# Patient Record
Sex: Female | Born: 1945
Health system: Southern US, Community
[De-identification: ages and names within clinical notes are randomized; demographics above are authoritative.]

## PROBLEM LIST (undated history)

## (undated) DIAGNOSIS — I499 Cardiac arrhythmia, unspecified: Secondary | ICD-10-CM

## (undated) DIAGNOSIS — M1990 Unspecified osteoarthritis, unspecified site: Secondary | ICD-10-CM

## (undated) DIAGNOSIS — Z9641 Presence of insulin pump (external) (internal): Secondary | ICD-10-CM

## (undated) DIAGNOSIS — E785 Hyperlipidemia, unspecified: Secondary | ICD-10-CM

## (undated) DIAGNOSIS — D219 Benign neoplasm of connective and other soft tissue, unspecified: Secondary | ICD-10-CM

## (undated) DIAGNOSIS — I2510 Atherosclerotic heart disease of native coronary artery without angina pectoris: Secondary | ICD-10-CM

## (undated) DIAGNOSIS — I4892 Unspecified atrial flutter: Secondary | ICD-10-CM

## (undated) DIAGNOSIS — E109 Type 1 diabetes mellitus without complications: Secondary | ICD-10-CM

## (undated) DIAGNOSIS — M8580 Other specified disorders of bone density and structure, unspecified site: Secondary | ICD-10-CM

## (undated) DIAGNOSIS — I10 Essential (primary) hypertension: Secondary | ICD-10-CM

## (undated) DIAGNOSIS — I38 Endocarditis, valve unspecified: Secondary | ICD-10-CM

## (undated) HISTORY — PX: PELVIC LAPAROSCOPY: SHX162

## (undated) HISTORY — DX: Unspecified osteoarthritis, unspecified site: M19.90

## (undated) HISTORY — PX: COLONOSCOPY: SHX174

## (undated) HISTORY — DX: Atherosclerotic heart disease of native coronary artery without angina pectoris: I25.10

## (undated) HISTORY — PX: TUBAL LIGATION: SHX77

## (undated) HISTORY — DX: Essential (primary) hypertension: I10

## (undated) HISTORY — DX: Benign neoplasm of connective and other soft tissue, unspecified: D21.9

## (undated) HISTORY — PX: APPENDECTOMY: SHX54

## (undated) HISTORY — PX: EYE SURGERY: SHX253

## (undated) HISTORY — DX: Other specified disorders of bone density and structure, unspecified site: M85.80

## (undated) HISTORY — DX: Unspecified atrial flutter: I48.92

## (undated) HISTORY — PX: CORONARY ANGIOPLASTY WITH STENT PLACEMENT: SHX49

## (undated) HISTORY — DX: Hyperlipidemia, unspecified: E78.5

## (undated) HISTORY — DX: Type 1 diabetes mellitus without complications: E10.9

## (undated) HISTORY — PX: CATARACT EXTRACTION: SUR2

---

## 1975-11-05 HISTORY — PX: MYOMECTOMY: SHX85

## 1998-03-21 ENCOUNTER — Ambulatory Visit (HOSPITAL_COMMUNITY): Admission: RE | Admit: 1998-03-21 | Discharge: 1998-03-21 | Payer: Self-pay | Admitting: Otolaryngology

## 1998-12-07 ENCOUNTER — Other Ambulatory Visit: Admission: RE | Admit: 1998-12-07 | Discharge: 1998-12-07 | Payer: Self-pay | Admitting: Obstetrics and Gynecology

## 1999-12-05 ENCOUNTER — Encounter: Payer: Self-pay | Admitting: Orthopedic Surgery

## 1999-12-05 ENCOUNTER — Encounter: Admission: RE | Admit: 1999-12-05 | Discharge: 1999-12-05 | Payer: Self-pay | Admitting: Orthopedic Surgery

## 2000-05-28 ENCOUNTER — Other Ambulatory Visit: Admission: RE | Admit: 2000-05-28 | Discharge: 2000-05-28 | Payer: Self-pay | Admitting: Obstetrics and Gynecology

## 2001-05-28 ENCOUNTER — Other Ambulatory Visit: Admission: RE | Admit: 2001-05-28 | Discharge: 2001-05-28 | Payer: Self-pay | Admitting: Obstetrics and Gynecology

## 2002-05-28 ENCOUNTER — Other Ambulatory Visit: Admission: RE | Admit: 2002-05-28 | Discharge: 2002-05-28 | Payer: Self-pay | Admitting: Obstetrics and Gynecology

## 2002-12-06 ENCOUNTER — Encounter: Payer: Self-pay | Admitting: Cardiovascular Disease

## 2002-12-06 ENCOUNTER — Ambulatory Visit (HOSPITAL_COMMUNITY): Admission: RE | Admit: 2002-12-06 | Discharge: 2002-12-06 | Payer: Self-pay | Admitting: Cardiovascular Disease

## 2004-05-03 ENCOUNTER — Other Ambulatory Visit: Admission: RE | Admit: 2004-05-03 | Discharge: 2004-05-03 | Payer: Self-pay | Admitting: Obstetrics and Gynecology

## 2005-05-10 ENCOUNTER — Other Ambulatory Visit: Admission: RE | Admit: 2005-05-10 | Discharge: 2005-05-10 | Payer: Self-pay | Admitting: Addiction Medicine

## 2006-05-13 ENCOUNTER — Other Ambulatory Visit: Admission: RE | Admit: 2006-05-13 | Discharge: 2006-05-13 | Payer: Self-pay | Admitting: Obstetrics and Gynecology

## 2007-05-27 ENCOUNTER — Other Ambulatory Visit: Admission: RE | Admit: 2007-05-27 | Discharge: 2007-05-27 | Payer: Self-pay | Admitting: Obstetrics and Gynecology

## 2008-05-30 ENCOUNTER — Inpatient Hospital Stay (HOSPITAL_BASED_OUTPATIENT_CLINIC_OR_DEPARTMENT_OTHER): Admission: RE | Admit: 2008-05-30 | Discharge: 2008-05-30 | Payer: Self-pay | Admitting: Cardiovascular Disease

## 2008-06-01 ENCOUNTER — Ambulatory Visit (HOSPITAL_COMMUNITY): Admission: RE | Admit: 2008-06-01 | Discharge: 2008-06-02 | Payer: Self-pay | Admitting: Cardiovascular Disease

## 2008-06-28 ENCOUNTER — Other Ambulatory Visit: Admission: RE | Admit: 2008-06-28 | Discharge: 2008-06-28 | Payer: Self-pay | Admitting: Obstetrics and Gynecology

## 2008-06-30 ENCOUNTER — Encounter (HOSPITAL_COMMUNITY): Admission: RE | Admit: 2008-06-30 | Discharge: 2008-08-01 | Payer: Self-pay | Admitting: Cardiovascular Disease

## 2008-08-02 ENCOUNTER — Encounter (HOSPITAL_COMMUNITY): Admission: RE | Admit: 2008-08-02 | Discharge: 2008-10-31 | Payer: Self-pay | Admitting: Cardiovascular Disease

## 2009-07-13 ENCOUNTER — Other Ambulatory Visit: Admission: RE | Admit: 2009-07-13 | Discharge: 2009-07-13 | Payer: Self-pay | Admitting: Obstetrics and Gynecology

## 2009-07-13 ENCOUNTER — Encounter: Payer: Self-pay | Admitting: Obstetrics and Gynecology

## 2009-07-13 ENCOUNTER — Ambulatory Visit: Payer: Self-pay | Admitting: Obstetrics and Gynecology

## 2009-08-01 ENCOUNTER — Ambulatory Visit: Payer: Self-pay | Admitting: Obstetrics and Gynecology

## 2010-07-24 ENCOUNTER — Other Ambulatory Visit: Admission: RE | Admit: 2010-07-24 | Discharge: 2010-07-24 | Payer: Self-pay | Admitting: Obstetrics and Gynecology

## 2010-07-24 ENCOUNTER — Ambulatory Visit: Payer: Self-pay | Admitting: Obstetrics and Gynecology

## 2010-10-05 ENCOUNTER — Ambulatory Visit: Payer: Self-pay | Admitting: Cardiovascular Disease

## 2010-12-07 ENCOUNTER — Ambulatory Visit (INDEPENDENT_AMBULATORY_CARE_PROVIDER_SITE_OTHER): Payer: Managed Care, Other (non HMO) | Admitting: *Deleted

## 2010-12-07 DIAGNOSIS — I4892 Unspecified atrial flutter: Secondary | ICD-10-CM

## 2010-12-10 ENCOUNTER — Telehealth (INDEPENDENT_AMBULATORY_CARE_PROVIDER_SITE_OTHER): Payer: Self-pay | Admitting: *Deleted

## 2010-12-11 ENCOUNTER — Other Ambulatory Visit (HOSPITAL_COMMUNITY): Payer: Managed Care, Other (non HMO)

## 2010-12-11 ENCOUNTER — Encounter: Payer: Self-pay | Admitting: Cardiology

## 2010-12-11 ENCOUNTER — Ambulatory Visit (HOSPITAL_COMMUNITY): Payer: Managed Care, Other (non HMO) | Attending: Cardiovascular Disease

## 2010-12-11 DIAGNOSIS — R0609 Other forms of dyspnea: Secondary | ICD-10-CM

## 2010-12-11 DIAGNOSIS — I4891 Unspecified atrial fibrillation: Secondary | ICD-10-CM | POA: Insufficient documentation

## 2010-12-11 DIAGNOSIS — R079 Chest pain, unspecified: Secondary | ICD-10-CM | POA: Insufficient documentation

## 2010-12-11 DIAGNOSIS — R0602 Shortness of breath: Secondary | ICD-10-CM

## 2010-12-11 DIAGNOSIS — E109 Type 1 diabetes mellitus without complications: Secondary | ICD-10-CM

## 2010-12-11 DIAGNOSIS — I2510 Atherosclerotic heart disease of native coronary artery without angina pectoris: Secondary | ICD-10-CM

## 2010-12-12 ENCOUNTER — Ambulatory Visit (HOSPITAL_COMMUNITY): Payer: Managed Care, Other (non HMO) | Attending: Cardiovascular Disease

## 2010-12-12 ENCOUNTER — Encounter: Payer: Self-pay | Admitting: Cardiology

## 2010-12-12 DIAGNOSIS — I059 Rheumatic mitral valve disease, unspecified: Secondary | ICD-10-CM | POA: Insufficient documentation

## 2010-12-12 DIAGNOSIS — E119 Type 2 diabetes mellitus without complications: Secondary | ICD-10-CM | POA: Insufficient documentation

## 2010-12-12 DIAGNOSIS — E785 Hyperlipidemia, unspecified: Secondary | ICD-10-CM | POA: Insufficient documentation

## 2010-12-12 DIAGNOSIS — I079 Rheumatic tricuspid valve disease, unspecified: Secondary | ICD-10-CM | POA: Insufficient documentation

## 2010-12-12 DIAGNOSIS — I4891 Unspecified atrial fibrillation: Secondary | ICD-10-CM | POA: Insufficient documentation

## 2010-12-14 ENCOUNTER — Ambulatory Visit: Payer: Managed Care, Other (non HMO) | Admitting: Cardiology

## 2010-12-14 ENCOUNTER — Ambulatory Visit (INDEPENDENT_AMBULATORY_CARE_PROVIDER_SITE_OTHER): Payer: Managed Care, Other (non HMO) | Admitting: Cardiovascular Disease

## 2010-12-14 DIAGNOSIS — I4892 Unspecified atrial flutter: Secondary | ICD-10-CM

## 2010-12-14 DIAGNOSIS — I2510 Atherosclerotic heart disease of native coronary artery without angina pectoris: Secondary | ICD-10-CM

## 2010-12-18 ENCOUNTER — Inpatient Hospital Stay (HOSPITAL_BASED_OUTPATIENT_CLINIC_OR_DEPARTMENT_OTHER)
Admission: RE | Admit: 2010-12-18 | Discharge: 2010-12-18 | Disposition: A | Discharge status: Home / Self Care | Payer: Managed Care, Other (non HMO) | Source: Ambulatory Visit | Attending: Cardiovascular Disease | Admitting: Cardiovascular Disease

## 2010-12-18 DIAGNOSIS — Z9861 Coronary angioplasty status: Secondary | ICD-10-CM | POA: Insufficient documentation

## 2010-12-18 DIAGNOSIS — I2510 Atherosclerotic heart disease of native coronary artery without angina pectoris: Secondary | ICD-10-CM | POA: Insufficient documentation

## 2010-12-18 DIAGNOSIS — R9439 Abnormal result of other cardiovascular function study: Secondary | ICD-10-CM | POA: Insufficient documentation

## 2010-12-18 LAB — POCT I-STAT GLUCOSE
Glucose, Bld: 147 mg/dL — ABNORMAL HIGH (ref 70–99)
Operator id: 194801

## 2010-12-20 NOTE — Assessment & Plan Note (Signed)
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  Patient has insulin pump. BS 87 earlier, rechecked at 1245  57.  Nuclear Med Study 1 or 2 day study:  1 day     Stress Test Type:  Treadmill/Lexiscan Reading MD:  Olga Millers, MD     Referring MD:  P.[name] Resting Radionuclide:  Technetium 73m Tetrofosmin     Resting Radionuclide Dose:  11 mCi  Stress Radionuclide:  Technetium 17m Tetrofosmin     Stress Radionuclide Dose:  33 mCi   Stress Protocol  Max Systolic BP: 131 mm Hg   Stress Test Technologist:  Milana Na, EMT-P     Nuclear Technologist:  Domenic Polite, CNMT  Rest Procedure  Myocardial perfusion imaging was performed at rest 45 minutes following the intravenous administration of Technetium 63m Tetrofosmin.  Stress Procedure  The patient received IV Lexiscan 0.4 mg over 15-seconds with concurrent low level exercise and then Technetium 37m Tetrofosmin was injected at 30-seconds while the patient continued walking one more minute.  There were no significant changes with Lexiscan.  Quantitative spect images were obtained after a 45 minute delay.  QPS Raw Data Images:  Acquisition technically good; normal left ventricular size. Stress Images:  There is decreased uptake in the distal anterior wall and apex. Rest Images:   There is decreased uptake in the distal anterior wall and apex, less prominent compared to the stress images. Subtraction (SDS):  These findings are consistent with ischemia. Transient Ischemic Dilatation:  1.01  (Normal <1.22)  Lung/Heart Ratio:  .33  (Normal <0.45)  Quantitative Gated Spect Images QGS EDV:  90 ml QGS ESV:  26 ml QGS EF:  72 % QGS cine images:  Normal wall motion.   Overall Impression  Exercise Capacity: Lexiscan with no exercise. BP Response: Normal blood pressure response. Clinical Symptoms: No chest pain ECG Impression: No diagnostic ST segment change suggestive of ischemia. Overall Impression: Abnormal lexiscan nuclear study with soft tissue attenuation vs small prior distal anterior infarct; mild ischemia in the apex.  Appended Document: Cardiology Nuclear Testing copy send to Dr. Elease Hashimoto

## 2010-12-20 NOTE — Progress Notes (Signed)
Summary: nuc pre procedure  Phone Note Outgoing Call Call back at Home Phone [phone]   Call placed by: Cathlyn Parsons RN,  December 10, 2010 2:50 PM Call placed to: Patient Reason for Call: Confirm/change Appt Summary of Call: Left message with information on Myoview Information Sheet (see scanned document for details).      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

## 2011-01-03 NOTE — Procedures (Signed)
  NAME:  KELLYN, MCCARY NO.:  [id number]  MEDICAL RECORD NO.:  [id number]           PATIENT TYPE:  LOCATION:                                 FACILITY:  PHYSICIAN:  Vesta Mixer, M.D. DATE OF BIRTH:  [birth year]  DATE OF PROCEDURE:  12/18/2010 DATE OF DISCHARGE:                           CARDIAC CATHETERIZATION   Yolonda Purtle is a 65 year old female with a long history of diabetes mellitus.  She has a history of coronary artery disease with PTCA and stenting of her proximal left anterior descending artery.  She recently developed an episode of atrial flutter with a rapid ventricular response.  This was treated effectively with Cardizem and she converted back to normal sinus rhythm a day or so later.  She was scheduled for a Cardiolite study which revealed apical defect.  Because of this apical abnormality, she was referred for cardiac catheterization.  PROCEDURE:  Left heart catheterization with coronary angiography.  The right femoral artery was easily cannulated using modified Seldinger technique.  HEMODYNAMICS:  The left ventricular pressure is 103/12.  The aortic pressure is 112/45.  ANGIOGRAPHY:  Left main.  The left main is fairly normal.  The left anterior descending artery reveals a stent in the proximal LAD. This stent is widely patent.  The first diagonal artery is normal.  The second diagonal artery has minor luminal irregularities.  The left circumflex artery is a fairly large branch.  There is a very high obtuse marginal artery which has a 50% stenosis at its origin. There is also of 40-50% stenosis at its midpoint.  This vessel is fairly small and is perhaps 1.5 mm in diameter.  The second obtuse marginal artery is a fairly large branch and also has a 50-60% stenosis.  The distal circumflex is free of critical disease.  The right coronary artery is dominant.  There are mild diffuse irregularities.  The left ventriculogram is performed  in the 30 RAO position.  It reveals overall normal left ventricular systolic function.  There is no significant mitral regurgitation.  COMPLICATIONS:  None.  CONCLUSION:  Moderate coronary artery disease involving primarily the obtuse marginal artery #1 and obtuse marginal artery #2.  Her stent is widely patent.  We will continue with medical therapy.     Vesta Mixer, M.D.     PJN/MEDQ  D:  12/18/2010  T:  12/19/2010  Job:  [id number]  cc:   Alfonse Alpers. Dagoberto Ligas, M.D.  Electronically Signed by Kristeen Miss M.D. on 01/03/2011 06:15:03 PM

## 2011-01-16 ENCOUNTER — Ambulatory Visit (INDEPENDENT_AMBULATORY_CARE_PROVIDER_SITE_OTHER): Payer: PRIVATE HEALTH INSURANCE | Admitting: Cardiovascular Disease

## 2011-01-16 DIAGNOSIS — I5032 Chronic diastolic (congestive) heart failure: Secondary | ICD-10-CM

## 2011-01-16 DIAGNOSIS — Z9861 Coronary angioplasty status: Secondary | ICD-10-CM

## 2011-01-25 NOTE — H&P (Signed)
NAME:  [name], [name] NO.:  [id number]  MEDICAL RECORD NO.:  [id number]           PATIENT TYPE:  O  LOCATION:  NINV                         FACILITY:  [facility]  PHYSICIAN:  Vesta Mixer, M.D. DATE OF BIRTH:  [birth year]  DATE OF ADMISSION:  12/12/2010 DATE OF DISCHARGE:                             HISTORY & PHYSICAL   [name] is a middle-aged female with a history of coronary artery disease - status post PTCA and stenting of her proximal left anterior descending artery.  She has insulin-dependent diabetes mellitus and a history of atrial flutter.  She also has a history of hyperlipidemia.  She presented to our office last week with an episode of rapid atrial fibrillation.  She was started on Cardizem and converted back to sinus rhythm the following day.  She has felt fairly well.  She had a stress Myoview study which revealed apical ischemia.  Her left ventricular systolic function was normal.  She had an echocardiogram which also revealed normal left ventricular systolic function.  She has mild mitral regurgitation.  She is now scheduled for heart catheterization based on the results of the stress test.  She denies any recent episodes of chest pain or shortness breath.  She denies any syncope or presyncope.  She denies any PND or orthopnea.  CURRENT MEDICATIONS: 1. Insulin pump as directed by Dr. Dagoberto Ligas. 2. Aspirin 325 mg a day. 3. Calcium once a day. 4. Toprol-XL 150 mg a day. 5. Digoxin 0.25 mg a day. 6. Multivitamin once a day. 7. Vitamin D once a day. 8. Plavix 75 mg a day. 9. Crestor 20 mg a day. 10.Niaspan 2 g at night. 11.Nitroglycerin as needed. 12.Cardizem CD 240 mg a day. 13.Pradaxa 150 mg twice a day.  ALLERGIES:  She is allergic to [facility].  PAST MEDICAL HISTORY: 1. History of coronary artery disease - status post PTCA and stenting     of her proximal LAD.  She was noted to have a mid LAD stenosis at     that time. 2. Diabetes  mellitus. 3. History of atrial flutter. 4. Hyperlipidemia.  SOCIAL HISTORY:  The patient does not smoke.  She drinks alcohol occasionally.  FAMILY HISTORY:  Essentially negative.  REVIEW OF SYSTEMS:  As noted in the HPI.  All other systems are negative.  PHYSICAL EXAMINATION:  GENERAL:  She is a middle-aged female in no acute distress.  She is alert and oriented x3.  Mood and affect are normal. VITAL SIGNS:  Her weight is 191 pounds, her blood pressure is 126/62, her heart rate 62. HEENT AND NECK:  2+ carotids.  She has no bruits, no JVD, no thyromegaly.  Her mucous membranes are moist.  Her neck is supple. LUNGS:  Clear.  Her chest wall is nontender. HEART:  Regular rate, S1 and S2. ABDOMEN:  Good bowel sounds and is nontender. EXTREMITIES:  She has no clubbing, cyanosis or edema.  Her pulses are intact. NEURO:  Nonfocal.  Cranial nerves II-XII are intact.  Motor and sensory function intact.  IMPRESSION AND PLAN: 1. Abnormal Cardiolite study.  The patient has a  history of coronary     artery disease and has a known mid-LAD stenosis.  She had an apical     defect on her Myoview study.  We will schedule her for a heart     catheterization for further evaluation.  I cannot completely     exclude breast attenuation but the images certainly appear to be     ischemic.  We will plan this cath for next Tuesday. 2. Atrial flutter.  The patient is converted to sinus rhythm at least     clinically.  I do not have a clear and cut reason as to why she is     having atrial flutter.  She does have mild mitral regurgitation.     Her left ventricular systolic function is normal.  We will go ahead     and stop the Pradaxa in preparation for a heart catheterization     next week.  We will have her start an aspirin once a day.  We will     follow up with her in a week or so following the cath.     Vesta Mixer, M.D.     PJN/MEDQ  D:  12/14/2010  T:  12/15/2010  Job:  [id number]  cc:    Alfonse Alpers. Dagoberto Ligas, M.D.  Electronically Signed by Kristeen Miss M.D. on 01/03/2011 06:15:01 PM

## 2011-03-19 NOTE — Cardiovascular Report (Signed)
NAME:  [name], [name] NO.:  [id number]   MEDICAL RECORD NO.:  [id number]          PATIENT TYPE:  OIB   LOCATION:  1963                         FACILITY:  [facility]   PHYSICIAN:  Vesta Mixer, M.D. DATE OF BIRTH:  01/29/1946   DATE OF PROCEDURE:  05/30/2008  DATE OF DISCHARGE:                            CARDIAC CATHETERIZATION   [name] is a 65 year old female with a history of insulin-dependent  diabetes mellitus.  She has a longstanding history of atrial flutter,  hyperlipidemia, and diabetes mellitus.   She recently presented with an episode of severe chest pain and tachy  palpitations.  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  She had this sudden intense feeling of chest pressure and a very  fast heart rate.  She tried to get off the elliptical machine  immediately and still felt like she was going to pass out.  Gradually,  the sensation resolved.  She has also had some episodes of severe  shortness of breath with minor exertion.  She was seen in the office and  was found to have anterior T-wave inversions and was scheduled for heart  catheterization.   PROCEDURE:  Left heart catheterization with coronary angiography.   The right femoral artery was easily cannulated using modified Seldinger  technique.   HEMODYNAMICS:  LV pressure was 134/13 with an aortic pressure of 123/59.  There was actually no difficulty in crossing the aortic valve, so I  suspect that some of this gradient is perhaps artifact.   ANGIOGRAPHY:  Left main:  The left main is fairly normal.   The left anterior descending artery has a proximal 90% stenosis.  The  plaque is eccentric and it appears like a plaque rupture.  The vessel  appears to be between 2.8 and 3 mm in diameter.   The mid LAD has a long 30-40% stenosis which does not appear to be flow  obstructive.  The distal LAD is unremarkable.  The first diagonal artery  is a very tiny vessel that is  basically insignificant.  Second diagonal  artery is a small-to-moderate-sized vessel with a 20% stenosis  proximally.   The first septal perforator is a small branch and actually originates  near the middle of this proximal LAD.   The left circumflex artery is a moderate-sized vessel.  There is a 20%  stenosis at its origin.  The circumflex artery then gives off a large  first obtuse marginal artery.  This first obtuse marginal artery has a  long 40% stenosis.  The remaining OM and the circumflex artery are  unremarkable.   The right coronary artery is relatively large and is dominant.  There is  a long 20-30% stenosis in the proximal and mid segments.  The distal RCA  is unremarkable.  The posterior descending artery is a moderate-sized  vessel and is normal.  The posterolateral branch is extremely small and  is otherwise normal.   The left ventriculogram was performed in a 30 RAO position.  It reveals  overall well-preserved left ventricular systolic function.  The ejection  fraction is approximately 60-65%.   COMPLICATIONS:  None.   CONCLUSIONS:  Severe coronary artery disease involving the proximal left  anterior descending artery.  She has an ulcerated plaque with a 90%  stenosis.  She has mild-to-moderate irregularities in the mid LAD and in  the first obtuse marginal artery.  We will plan a PCI on her proximal  left anterior descending artery in several days.  We will start her on  Plavix 75 mg a day.           ______________________________  Vesta Mixer, M.D.     PJN/MEDQ  D:  05/30/2008  T:  05/31/2008  Job:  [id number]   cc:   Alfonse Alpers. Dagoberto Ligas, M.D.

## 2011-03-19 NOTE — Cardiovascular Report (Signed)
NAME:  DELANEY, PERONA NO.:  [id number]   MEDICAL RECORD NO.:  [id number]          PATIENT TYPE:  OIB   LOCATION:  2927                         FACILITY:  [facility]   PHYSICIAN:  Vesta Mixer, M.D. DATE OF BIRTH:  1946/08/09   DATE OF PROCEDURE:  06/01/2008  DATE OF DISCHARGE:                            CARDIAC CATHETERIZATION   [name] is a 65 year old female with a long history of diabetes  mellitus.  She recent developed some symptoms of exercise-induced  angina.  She had a diagnostic heart catheterization, which revealed a  90% proximal left anterior descending artery stenosis.  She was found to  have moderate mid LAD stenosis and a moderate left circumflex stenosis.  We brought her back for PCI of her proximal LAD stenosis.   The procedure was left heart catheterization.  She had PTCA and stenting  of her left anterior descending artery.  The right femoral artery was  easily cannulated using a modified Seldinger technique.   The left main was engaged using a Judkins left 4 guide.  Angiomax was  given.  Her ACT was 344.   A Prowater angioplasty wire was positioned down into the distal left  anterior descending artery.  A 2.5 x 23-mm probe and stent was  positioned across the stenosis.  It was deployed at 12 atmospheres for  20 seconds.  This balloon was removed and post-stent dilatation was  achieved using a 2.75-mm x 20-mm Quantum Maverick.  It was positioned  distally and inflated up to 14 atmospheres for 23 seconds and then  pulled back to the proximal segment and inflated up to 48 atmospheres  for 27 seconds.  This gave [place] a very nice angiographic result.  The 90%  residual stenosis was reduced to 0% residual.  She did not have any  symptoms.  She was stable leaving lab.   COMPLICATIONS:  None.   CONCLUSION:  Successful PTCA and stenting of the proximal LAD.  We will  anticipate discharge tomorrow.  She is to continue with Plavix.     ______________________________  Vesta Mixer, M.D.     PJN/MEDQ  D:  06/01/2008  T:  06/02/2008  Job:  [id number]   cc:   Alfonse Alpers. Dagoberto Ligas, M.D.

## 2011-03-19 NOTE — Discharge Summary (Signed)
NAME:  SELAM, [name] NO.:  [id number]   MEDICAL RECORD NO.:  [id number]          PATIENT TYPE:  OIB   LOCATION:  2927                         FACILITY:  [facility]   PHYSICIAN:  Vesta Mixer, M.D. DATE OF BIRTH:  [birth year]   DATE OF ADMISSION:  06/01/2008  DATE OF DISCHARGE:  06/02/2008                               DISCHARGE SUMMARY   DISCHARGE DIAGNOSES:  1. Coronary artery disease - status post percutaneous transluminal      coronary angioplasty and stenting of her left anterior descending      artery.  2. Hypercholesterolemia.  3. History of diabetes mellitus.  4. History of atrial flutter.   DISCHARGE MEDICATIONS:  1. Aspirin 325 mg a day.  2. Plavix 75 mg a day.  3. Toprol-XL 150 mg a day.  4. Digoxin 0.25 mg a day.  5. Nitroglycerin 0.4 mg sublingually as needed.  6. Crestor 20 mg a day.  7. Insulin pump as directed by Dr. Dagoberto Ligas.  8. Calcium once a day.  9. Multivitamin once a day.   DISPOSITION:  The patient will see Dr. Elease Hashimoto in 1-2 weeks for followup  office visit.   HISTORY:  [name] is a 65 year old female who recently presented with  an episode of exertional angina.  She had a diagnostic heart  catheterization which revealed a 90% proximal LAD stenosis.  She is  admitted for angioplasty.   HOSPITAL COURSE:  Coronary artery disease.  The patient had a heart  catheterization which revealed again a 90% stenosis in her proximal LAD.  She does have a 40%-50% stenosis in the mid LAD.  This mid LAD lesion is  quite long and really is not a great candidate for angioplasty at this  time and also I do not think that angioplasty is indicated.   She underwent successful PTCA and stenting of the proximal LAD lesion.  We placed a 2.5 x 23 mm Promus stent deployed at 12 atmospheres.  Post-  stent dilatation was achieved using a 2.75 x 20-mm Quantum Maverick  inflated up to 14 atmospheres.  This gave [place] a very nice angiographic  result with a 0%  residual stenosis.  She tolerated the procedure quite  well and did not have any pains.   We have decided to increase her cholesterol medicine Crestor 20 mg a day  which should be stronger than her Lipitor 80 mg a day.  She will be  discharged home on the above-noted medications in disposition.  She is  to call us if she has any problems.  All of her other medical problems  are stable.           ______________________________  Vesta Mixer, M.D.     PJN/MEDQ  D:  06/02/2008  T:  06/02/2008  Job:  [id number]   cc:   Alfonse Alpers. Dagoberto Ligas, M.D.

## 2011-03-19 NOTE — H&P (Signed)
NAME:  [name], [name] NO.:  [id number]   MEDICAL RECORD NO.:  [id number]            PATIENT TYPE:   LOCATION:                                 FACILITY:   PHYSICIAN:  Vesta Mixer, M.D. DATE OF BIRTH:  [birth year]   DATE OF ADMISSION:  DATE OF DISCHARGE:                              HISTORY & PHYSICAL   [name] is a middle-aged female with a history of mitral valve  prolapse and atrial flutter as well as insulin-dependent diabetes  mellitus.  She is admitted for heart catheterization after having  episodes of chest pain and rapid heartbeat.   [name] is a middle-aged female who has had diabetes mellitus for many  years.  She has had a stress Cardiolite study in the past, which was  unremarkable.  She has a history of atrial flutter, which has been well  controlled on her current medications including metoprolol and digoxin.  She has done fairly well and her diabetes has been fairly well  controlled.   She worked out at [employer] this past week and then had a sudden onset of  lightheadedness, shortness of breath, and tachy palpitations.  Her heart  was beating very fast, in fact, she says faster than when she initially  had her atrial flutter.   The got off the elliptical machine and felt quite weak and dizzy for a  couple of minutes.  The sensation gradually passed.  She still feels a  little bit uneasy, although she has not had return of these  palpitations.  She presents today for further evaluation.   She denies any syncope or presyncope.   CURRENT MEDICATIONS:  1. Insulin pump daily.  2. Aspirin 81 mg a day.  3. Lipitor 80 mg a day.  4. Toprol-XL 150 mg a day.  5. Digoxin 0.25 mg a day.  6. Multivitamin once a day.  7. Humalog pump once a day.   ALLERGIES:  She is allergic to Septra.   PAST MEDICAL HISTORY:  1. History of atrial flutter.  2. Diabetes mellitus.  3. Hyperlipidemia.   SOCIAL HISTORY:  The patient is a nonsmoker.  She drinks  alcohol  occasionally.   FAMILY HISTORY:  Basically unremarkable.   REVIEW OF SYSTEMS:  Reviewed and is essentially negative.   PHYSICAL EXAMINATION:  GENERAL:  She is a middle-aged female, in no  acute distress.  She is alert and oriented x3 and her mood and affect  are normal.  VITAL SIGNS:  Her weight is 180 and blood pressure is 114/70 with heart  rate of 72.  HEENT:  Reveals 2+ carotids.  She has no bruits, no JVD, or no  thyromegaly.  LUNGS:  Clear to auscultation.  HEART:  Regular rate, S1 and S2.  ABDOMEN:  Good bowel sounds and is nontender.  EXTREMITIES:  She has no clubbing, cyanosis, or edema.  NEUROLOGIC:  Nonfocal.   EKG reveals normal sinus rhythm.  She has T-wave inversions in the  anterolateral leads, which are new.   [name] presents with some rather atypical symptoms.  Her symptoms  could  have been atrial flutter, although, I doubt that she would have  developed atrial flutter with a rapid rate when she is on such a high-  dose of metoprolol.  I worry that this might have been ventricular  tachycardia.  She has a long history of coronary artery disease and she  has T-wave inversions in the anterolateral leads, which are worrisome  for ischemia.  I think, our best course is to proceed with coronary  angiogram to evaluate this further.  I have asked her not to exercise  until we this sorted out.  We have discussed the risks, benefits, and  options of heart catheterization.  She understands and agrees to  proceed.           ______________________________  Vesta Mixer, M.D.     PJN/MEDQ  D:  05/24/2008  T:  05/25/2008  Job:  [id number]   cc:   Alfonse Alpers. Dagoberto Ligas, M.D.

## 2011-04-04 ENCOUNTER — Encounter: Payer: Self-pay | Admitting: Cardiovascular Disease

## 2011-04-05 ENCOUNTER — Encounter: Payer: Self-pay | Admitting: Cardiovascular Disease

## 2011-04-05 ENCOUNTER — Ambulatory Visit: Payer: PRIVATE HEALTH INSURANCE | Admitting: Cardiovascular Disease

## 2011-04-05 ENCOUNTER — Ambulatory Visit (INDEPENDENT_AMBULATORY_CARE_PROVIDER_SITE_OTHER): Payer: PRIVATE HEALTH INSURANCE | Admitting: Cardiovascular Disease

## 2011-04-05 DIAGNOSIS — I2510 Atherosclerotic heart disease of native coronary artery without angina pectoris: Secondary | ICD-10-CM

## 2011-04-05 DIAGNOSIS — E119 Type 2 diabetes mellitus without complications: Secondary | ICD-10-CM

## 2011-04-05 DIAGNOSIS — Z794 Long term (current) use of insulin: Secondary | ICD-10-CM | POA: Insufficient documentation

## 2011-04-05 DIAGNOSIS — I480 Paroxysmal atrial fibrillation: Secondary | ICD-10-CM | POA: Insufficient documentation

## 2011-04-05 DIAGNOSIS — I4892 Unspecified atrial flutter: Secondary | ICD-10-CM | POA: Insufficient documentation

## 2011-04-05 DIAGNOSIS — I4891 Unspecified atrial fibrillation: Secondary | ICD-10-CM

## 2011-04-05 DIAGNOSIS — I25119 Atherosclerotic heart disease of native coronary artery with unspecified angina pectoris: Secondary | ICD-10-CM | POA: Insufficient documentation

## 2011-04-05 MED ORDER — METOPROLOL SUCCINATE ER 100 MG PO TB24: 150.0000 mg | ORAL_TABLET | Freq: Every day | ORAL | Status: DC

## 2011-04-05 MED ORDER — ROSUVASTATIN CALCIUM 20 MG PO TABS: 20.0000 mg | ORAL_TABLET | Freq: Every day | ORAL | Status: DC

## 2011-04-05 MED ORDER — DILTIAZEM HCL ER COATED BEADS 120 MG PO CP24: 120.0000 mg | ORAL_CAPSULE | Freq: Every day | ORAL | Status: DC

## 2011-04-05 MED ORDER — CLOPIDOGREL BISULFATE 75 MG PO TABS: 75.0000 mg | ORAL_TABLET | Freq: Every day | ORAL | Status: DC

## 2011-04-05 MED ORDER — DIGOXIN 250 MCG PO TABS: 250.0000 ug | ORAL_TABLET | Freq: Every day | ORAL | Status: DC

## 2011-04-05 NOTE — Progress Notes (Signed)
[name] Date of Birth  08/21/1946 [facility] / [facility] [address]     [address] [place], [place]  [address] [phone]  Fax  [phone]  History of Present Illness:  [name] is a middle-aged female with a history of coronary artery disease, diabetes mellitus, intermittent atrial fibrillation, and hypercholesterolemia. She's done very well since I last saw her 3 months ago. She still under considerable stress getting ready for her daughter's wedding.  She developed atrial fibrillation several months ago. She was started on Cardizem and atrial fibrillation resolved. She seems to be going fairly well. She's had a cardiac catheterization which revealed patent stent in her left anterior descending artery.  Current Outpatient Prescriptions on File Prior to Visit  Medication Sig Dispense Refill  . aspirin 81 MG tablet Take 81 mg by mouth daily.        . Calcium Carbonate-Vitamin D (CALCIUM + D PO) Take by mouth 2 (two) times daily.        . Cholecalciferol (VITAMIN D PO) Take by mouth daily.        . clopidogrel (PLAVIX) 75 MG tablet Take 75 mg by mouth daily.        . digoxin (LANOXIN) 0.25 MG tablet Take 250 mcg by mouth daily.        . Insulin Aspart (INSULIN PUMP) 100 unit/ml SOLN Inject into the skin.        . metoprolol (TOPROL-XL) 100 MG 24 hr tablet Take 150 mg by mouth daily.        . Multiple Vitamin (MULTIVITAMIN) tablet Take 1 tablet by mouth daily.        Marland Kitchen omega-3 acid ethyl esters (LOVAZA) 1 G capsule Take 2 g by mouth 2 (two) times daily.        . rosuvastatin (CRESTOR) 20 MG tablet Take by mouth daily.        Marland Kitchen DISCONTD: Pioglitazone HCl (ACTOS PO) Take by mouth daily.          Allergies  Allergen Reactions  . Septra (Bactrim)     Past Medical History  Diagnosis Date  . Coronary artery disease   . Diabetes mellitus   . Hyperlipidemia   . Atrial flutter     Past Surgical History  Procedure Date  . Coronary angioplasty  12/18/2010    NORMAL LEFT VENTRICULAR SYSTOLIC FUNCTION  . Coronary angioplasty with stent placement     History  Smoking status  . Never Smoker   Smokeless tobacco  . Not on file    History  Alcohol Use No    No family history on file.  Reviw of Systems:  Reviewed in the HPI.  All other systems are negative.  Physical Exam: BP 128/68  Pulse 70  Ht 5\' 9"  (1.753 m)  Wt 191 lb 12.8 oz (87 kg)  BMI 28.32 kg/m2 The patient is alert and oriented x 3.  The mood and affect are normal.  The skin is warm and dry.  Color is normal.  The HEENT exam reveals that the sclera are nonicteric.  The mucous membranes are moist.  The carotids are 2+ without bruits.  There is no thyromegaly.  There is no JVD.  The lungs are clear.  The chest wall is non tender.  The heart exam reveals a regular rate with a normal S1 and S2.  There are no murmurs, gallops, or rubs.  The PMI is not displaced.   Abdominal exam reveals good  bowel sounds.  There is no guarding or rebound.  There is no hepatosplenomegaly or tenderness.  There are no masses.  Exam of the legs reveal no clubbing, cyanosis, or edema.  The legs are without rashes.  The distal pulses are intact.  Cranial nerves II - XII are intact.  Motor and sensory functions are intact.  The gait is normal.  Assessment / Plan:

## 2011-04-05 NOTE — Assessment & Plan Note (Signed)
[name] is doing very well. She's not had any further episodes of chest pain. I'm glad to see that her recent heart catheterization was unremarkable. We'll continue with the same medications.

## 2011-04-05 NOTE — Assessment & Plan Note (Signed)
Her atrial fibrillation resolved after receiving Cardizem. We'll continue the Cardizem for now.

## 2011-07-19 DIAGNOSIS — M8580 Other specified disorders of bone density and structure, unspecified site: Secondary | ICD-10-CM | POA: Insufficient documentation

## 2011-07-19 DIAGNOSIS — D219 Benign neoplasm of connective and other soft tissue, unspecified: Secondary | ICD-10-CM | POA: Insufficient documentation

## 2011-08-02 LAB — CBC
Hemoglobin: 14.2
MCHC: 34.5
MCV: 91.8
RBC: 4.48
RBC: 4.91
WBC: 7.6

## 2011-08-02 LAB — BASIC METABOLIC PANEL
CO2: 27
Calcium: 9.7
Chloride: 106
Chloride: 111
Creatinine, Ser: 0.75
GFR calc Af Amer: >60
GFR calc Af Amer: >60
GFR calc non Af Amer: >60
Potassium: 3.7
Sodium: 142

## 2011-08-02 LAB — POCT I-STAT GLUCOSE: Operator id: 141321

## 2011-08-06 ENCOUNTER — Encounter: Payer: PRIVATE HEALTH INSURANCE | Admitting: Obstetrics and Gynecology

## 2011-08-07 LAB — GLUCOSE, CAPILLARY
Glucose-Capillary: 69 — ABNORMAL LOW
Glucose-Capillary: 79

## 2011-08-12 ENCOUNTER — Encounter: Payer: Self-pay | Admitting: Obstetrics and Gynecology

## 2011-08-13 ENCOUNTER — Other Ambulatory Visit: Payer: Self-pay | Admitting: Obstetrics and Gynecology

## 2011-08-13 ENCOUNTER — Other Ambulatory Visit: Payer: Self-pay | Admitting: *Deleted

## 2011-08-13 DIAGNOSIS — R922 Inconclusive mammogram: Secondary | ICD-10-CM

## 2011-08-27 ENCOUNTER — Encounter: Payer: Self-pay | Admitting: Obstetrics and Gynecology

## 2011-08-27 ENCOUNTER — Other Ambulatory Visit (HOSPITAL_COMMUNITY)
Admission: RE | Admit: 2011-08-27 | Discharge: 2011-08-27 | Disposition: A | Discharge status: Home / Self Care | Payer: PRIVATE HEALTH INSURANCE | Source: Ambulatory Visit | Attending: Obstetrics and Gynecology | Admitting: Obstetrics and Gynecology

## 2011-08-27 ENCOUNTER — Ambulatory Visit (INDEPENDENT_AMBULATORY_CARE_PROVIDER_SITE_OTHER): Payer: PRIVATE HEALTH INSURANCE | Admitting: Obstetrics and Gynecology

## 2011-08-27 VITALS — BP 150/80 | Ht 69.0 in | Wt 190.0 lb

## 2011-08-27 DIAGNOSIS — M949 Disorder of cartilage, unspecified: Secondary | ICD-10-CM

## 2011-08-27 DIAGNOSIS — Z01419 Encounter for gynecological examination (general) (routine) without abnormal findings: Secondary | ICD-10-CM | POA: Insufficient documentation

## 2011-08-27 DIAGNOSIS — M8580 Other specified disorders of bone density and structure, unspecified site: Secondary | ICD-10-CM

## 2011-08-27 NOTE — Progress Notes (Signed)
Patient came to see me today for her annual GYN exam. She is not having menopausal symptoms. She is having no vaginal bleeding. She is having no pelvic pain. She is up-to-date on mammograms. She is due for followup bone density because of osteopenia. She takes calcium and vitamin D and has had no fractures.  ROS: 12 point review done. Pertinent positives include coronary artery disease, diabetes, and atrophic fibrillation.  Physical examination: [name] present. Blood pressure slighty elevated but normal at PCP and last taken earlier this month. HEENT within normal limits. Neck: Thyroid not large. No masses. Supraclavicular nodes: not enlarged. Breasts: Examined in both sitting midline position. No skin changes and no masses. Abdomen: Soft no guarding rebound or masses or hernia. Pelvic: External: Within normal limits. BUS: Within normal limits. Vaginal:within normal limits. Good estrogen effect. No evidence of cystocele rectocele or enterocele. Cervix: clean. Uterus: Normal size and shape. Adnexa: No masses. Rectovaginal exam: Confirmatory and negative. Extremities: Within normal limits.  Assessment: Normal GYN exam  Plan: Bone density.

## 2011-09-10 ENCOUNTER — Encounter: Payer: PRIVATE HEALTH INSURANCE | Admitting: Obstetrics and Gynecology

## 2011-10-24 ENCOUNTER — Telehealth: Payer: Self-pay | Admitting: Cardiovascular Disease

## 2011-10-24 DIAGNOSIS — I2510 Atherosclerotic heart disease of native coronary artery without angina pectoris: Secondary | ICD-10-CM

## 2011-10-24 MED ORDER — METOPROLOL SUCCINATE ER 100 MG PO TB24: 150.0000 mg | ORAL_TABLET | Freq: Every day | ORAL | Status: DC

## 2011-10-24 NOTE — Telephone Encounter (Signed)
New msg Metoprolol 100 mg called to [phone] mailorder 90 day supply Pt wants enough oills until she gets mail order called to [facility] [address]

## 2011-10-24 NOTE — Telephone Encounter (Signed)
New rx sent to [facility] service and a refill to [facility] done, pt informed.

## 2011-11-28 ENCOUNTER — Ambulatory Visit: Payer: PRIVATE HEALTH INSURANCE | Admitting: Cardiovascular Disease

## 2011-12-05 ENCOUNTER — Encounter: Payer: Self-pay | Admitting: Cardiovascular Disease

## 2011-12-05 ENCOUNTER — Ambulatory Visit (INDEPENDENT_AMBULATORY_CARE_PROVIDER_SITE_OTHER): Payer: PRIVATE HEALTH INSURANCE | Admitting: Cardiovascular Disease

## 2011-12-05 DIAGNOSIS — I4892 Unspecified atrial flutter: Secondary | ICD-10-CM

## 2011-12-05 DIAGNOSIS — E785 Hyperlipidemia, unspecified: Secondary | ICD-10-CM

## 2011-12-05 DIAGNOSIS — I2510 Atherosclerotic heart disease of native coronary artery without angina pectoris: Secondary | ICD-10-CM

## 2011-12-05 DIAGNOSIS — I4891 Unspecified atrial fibrillation: Secondary | ICD-10-CM

## 2011-12-05 LAB — BASIC METABOLIC PANEL
BUN: 13 mg/dL (ref 6–23)
Chloride: 106 mEq/L (ref 96–112)
Creatinine, Ser: 0.7 mg/dL (ref 0.4–1.2)
Glucose, Bld: 71 mg/dL (ref 70–99)

## 2011-12-05 LAB — LIPID PANEL
Cholesterol: 171 mg/dL (ref 0–200)
LDL Cholesterol: 97 mg/dL (ref 0–99)

## 2011-12-05 LAB — HEPATIC FUNCTION PANEL
Alkaline Phosphatase: 96 U/L (ref 39–117)
Bilirubin, Direct: 0.1 mg/dL (ref 0.0–0.3)
Total Bilirubin: 0.4 mg/dL (ref 0.3–1.2)
Total Protein: 7.2 g/dL (ref 6.0–8.3)

## 2011-12-05 NOTE — Progress Notes (Signed)
Suzi Roots Date of Birth  [birth year] [facility]     Central Office  [address]    [address]   [address] [place], [place]  [address]    [place], [place]  [address] [phone]  Fax  [phone]  [phone]  Fax [phone]   History of Present Illness:  Renaye is doing very well. She's not having episodes of chest pain or shortness of breath. She's been able to below of her normal activities without any significant problems.  Current Outpatient Prescriptions on File Prior to Visit  Medication Sig Dispense Refill  . aspirin 81 MG tablet Take 81 mg by mouth daily.        . Calcium Carbonate-Vitamin D (CALCIUM + D PO) Take 600 mg by mouth 2 (two) times daily.       . Cholecalciferol (VITAMIN D PO) Take 1,000 mg by mouth daily.       . clopidogrel (PLAVIX) 75 MG tablet Take 1 tablet (75 mg total) by mouth daily.  90 tablet  3  . digoxin (LANOXIN) 0.25 MG tablet Take 1 tablet (250 mcg total) by mouth daily.  90 tablet  3  . diltiazem (CARDIZEM CD) 120 MG 24 hr capsule Take 1 capsule (120 mg total) by mouth daily.  90 capsule  3  . Insulin Aspart (INSULIN PUMP) 100 unit/ml SOLN Inject into the skin.        . metoprolol (TOPROL-XL) 100 MG 24 hr tablet Take 1.5 tablets (150 mg total) by mouth daily.  45 tablet  1  . Multiple Vitamin (MULTIVITAMIN) tablet Take 1 tablet by mouth daily.        Marland Kitchen omega-3 acid ethyl esters (LOVAZA) 1 G capsule Take 2 g by mouth 2 (two) times daily.        . rosuvastatin (CRESTOR) 20 MG tablet Take 1 tablet (20 mg total) by mouth daily.  90 tablet  3    Allergies  Allergen Reactions  . Septra (Bactrim)   . Sulfa Antibiotics     Past Medical History  Diagnosis Date  . Coronary artery disease   . Hyperlipidemia   . Atrial flutter   . Diabetes mellitus   . Fibroid   . Osteopenia     Past Surgical History  Procedure Date  . Coronary angioplasty 12/18/2010    NORMAL LEFT VENTRICULAR SYSTOLIC FUNCTION  . Coronary  angioplasty with stent placement   . Pelvic laparoscopy   . Myomectomy 1977  . Appendectomy     History  Smoking status  . Never Smoker   Smokeless tobacco  . Not on file    History  Alcohol Use  . 5.0 oz/week  . 10 drink(s) per week    Family History  Problem Relation Age of Onset  . Heart disease Mother   . Diabetes Mother   . Hypertension Mother   . Heart disease Father   . Heart disease Brother     Reviw of Systems:  Reviewed in the HPI.  All other systems are negative.  Physical Exam: Blood pressure 151/73, pulse 61, height 5\' 9"  (1.753 m), weight 190 lb 6.4 oz (86.365 kg). General: Well developed, well nourished, in no acute distress.  Head: Normocephalic, atraumatic, sclera non-icteric, mucus membranes are moist,   Neck: Supple. Negative for carotid bruits. JVD not elevated.  Lungs: Clear bilaterally to auscultation without wheezes, rales, or rhonchi. Breathing is unlabored.  Heart: RRR with S1 S2. No murmurs, rubs, or  gallops appreciated.  Abdomen: Soft, non-tender, non-distended with normoactive bowel sounds. No hepatomegaly. No rebound/guarding. No obvious abdominal masses.  Msk:  Strength and tone appear normal for age.  Extremities: No clubbing or cyanosis. No edema.  Distal pedal pulses are 2+ and equal bilaterally.  Neuro: Alert and oriented X 3. Moves all extremities spontaneously.  Psych:  Responds to questions appropriately with a normal affect.  ECG: Normal sinus rhythm. She has mild nonspecific ST and T-wave changes in the inferior leads.  EKG is not significantly changed from her previous sinus rhythm EKG in 2011. The most recent EKG was in February of 2012 and showed atrial flutter.  Assessment / Plan:

## 2011-12-05 NOTE — Patient Instructions (Signed)
Your physician wants you to follow-up in: 6 months You will receive a reminder letter in the mail two months in advance. If you don't receive a letter, please call our office to schedule the follow-up appointment.  Your physician recommends that you return for a FASTING lipid profile: today and in 6 months   Dr Rene Paci [phone]

## 2011-12-05 NOTE — Assessment & Plan Note (Signed)
[name] has not had any further episodes of chest pain. She is status post PTCA and stenting in the past.  We'll see her again in several months for an office visit and fasting labs.

## 2011-12-05 NOTE — Assessment & Plan Note (Signed)
Rosa has done well. She's not had any further episodes of atrial flutter. We'll continue with her current medications.

## 2011-12-06 ENCOUNTER — Other Ambulatory Visit: Payer: Self-pay | Admitting: *Deleted

## 2011-12-06 MED ORDER — ASPIRIN 325 MG PO TABS: 325.0000 mg | ORAL_TABLET | Freq: Every day | ORAL | Status: DC

## 2011-12-06 MED ORDER — NIACIN ER (ANTIHYPERLIPIDEMIC) 500 MG PO TBCR: 1000.0000 mg | EXTENDED_RELEASE_TABLET | Freq: Every day | ORAL | Status: DC

## 2011-12-06 NOTE — Telephone Encounter (Signed)
Her other dr/pcp started her on niaspan last week. I asked dr Elease Hashimoto and he does not want to start zetia then that was written on last lab draw. Her pcp will be checking labs in 3 months and a copy was requested.

## 2011-12-31 ENCOUNTER — Other Ambulatory Visit: Payer: Self-pay | Admitting: *Deleted

## 2011-12-31 MED ORDER — NITROGLYCERIN 0.4 MG SL SUBL: 0.4000 mg | SUBLINGUAL_TABLET | SUBLINGUAL | Status: DC | PRN

## 2012-03-25 ENCOUNTER — Encounter: Payer: Self-pay | Admitting: Internal Medicine

## 2012-03-25 ENCOUNTER — Ambulatory Visit (INDEPENDENT_AMBULATORY_CARE_PROVIDER_SITE_OTHER): Payer: PRIVATE HEALTH INSURANCE | Admitting: Internal Medicine

## 2012-03-25 VITALS — BP 140/72 | HR 88 | Temp 97.4°F | Ht 69.5 in | Wt 187.8 lb

## 2012-03-25 DIAGNOSIS — M899 Disorder of bone, unspecified: Secondary | ICD-10-CM

## 2012-03-25 DIAGNOSIS — F5104 Psychophysiologic insomnia: Secondary | ICD-10-CM

## 2012-03-25 DIAGNOSIS — G4700 Insomnia, unspecified: Secondary | ICD-10-CM

## 2012-03-25 DIAGNOSIS — I2510 Atherosclerotic heart disease of native coronary artery without angina pectoris: Secondary | ICD-10-CM

## 2012-03-25 DIAGNOSIS — M8580 Other specified disorders of bone density and structure, unspecified site: Secondary | ICD-10-CM

## 2012-03-25 DIAGNOSIS — Z23 Encounter for immunization: Secondary | ICD-10-CM

## 2012-03-25 DIAGNOSIS — M949 Disorder of cartilage, unspecified: Secondary | ICD-10-CM

## 2012-03-25 DIAGNOSIS — E119 Type 2 diabetes mellitus without complications: Secondary | ICD-10-CM

## 2012-03-25 MED ORDER — AMITRIPTYLINE HCL 10 MG PO TABS: 5.0000 mg | ORAL_TABLET | Freq: Every evening | ORAL | Status: DC | PRN

## 2012-03-25 NOTE — Patient Instructions (Signed)
It was good to see you today. We have reviewed your prior records including labs and tests today Tdap given today as Health Maintenance reviewed - all recommended immunizations and age-appropriate screenings are up-to-date.  Try amitriptyline as needed for sleep - Your prescription(s) have been submitted to your pharmacy. Please take as directed and contact our office if you believe you are having problem(s) with the medication(s). Other Medications reviewed, no changes at this time. we will send to your prior provider(s) for "release of records" as discussed today -  Please schedule followup in 6 months to continue review, call sooner if problems.

## 2012-03-25 NOTE — Assessment & Plan Note (Signed)
Follows with endo [name] Will send for ROI but defer mgmt to his care The current medical regimen is effective;  continue present plan and medications.

## 2012-03-25 NOTE — Progress Notes (Signed)
Subjective:    Patient ID: [name], female    DOB: 17-Apr-1946, 66 y.o.   MRN: [id number]  HPI  New pt to me and our division, here to establish care with a PCP - follows regularly with various specialists for her chronic issues - feels well overall today  Reviewed chronic medical issues -  CAD - ischemic event 05/2008 while on treadmill - s/p stent 06/01/08 - follows with cards for same - the patient reports compliance with medication(s) as prescribed. Denies adverse side effects.  DM2 - follow with endo for same - on metformin and humalog - denies symptoms hypoglycemia  Osteopenia - DEXA followed by gyn - last done 2012 - on Ca and Vit D, no bone pain, WB activity  complains of insomnia, chronic, ongoing for years - trouble falling asleep and staying asleep - denies depression or anxiety, no "stress" - admits to "restless dog" in bed with her  Past Medical History  Diagnosis Date  . Coronary artery disease   . Hyperlipidemia   . Atrial flutter   . Diabetes mellitus   . Fibroid   . Osteopenia   . Arthritis    Family History  Problem Relation Age of Onset  . Heart disease Mother   . Diabetes Mother   . Hypertension Mother   . Asthma Mother   . Heart disease Father   . Heart disease Brother    History  Substance Use Topics  . Smoking status: Never Smoker   . Smokeless tobacco: Not on file  . Alcohol Use: 5.0 oz/week    10 drink(s) per week   Review of Systems Constitutional: Negative for fever or weight change.  Respiratory: Negative for cough and shortness of breath.   Cardiovascular: Negative for chest pain or palpitations.  Gastrointestinal: Negative for abdominal pain, no bowel changes.  Musculoskeletal: Negative for gait problem or joint swelling.  Skin: Negative for rash.  Neurological: Negative for dizziness or headache.  No other specific complaints in a complete review of systems (except as listed in HPI above).     Objective:   Physical Exam BP  140/72  Pulse 88  Temp(Src) 97.4 F (36.3 C) (Oral)  Ht 5' 9.5" (1.765 m)  Wt 187 lb 12.8 oz (85.186 kg)  BMI 27.34 kg/m2  SpO2 97% Weight: 187 lb 12.8 oz (85.186 kg)  Constitutional: She is overweight, but appears fit, well-developed and well-nourished. No distress.  HENT: Head: Normocephalic and atraumatic. Ears: B TMs ok, no erythema or effusion; Nose: Nose normal. Mouth/Throat: Oropharynx is clear and moist. No oropharyngeal exudate.  Eyes: wears reading glasses. Conjunctivae and EOM are normal. Pupils are equal, round, and reactive to light. No scleral icterus.  Neck: Normal range of motion. Neck supple. No JVD present. No thyromegaly present.  Cardiovascular: Normal rate, regular rhythm and normal heart sounds.  No murmur heard. No BLE edema. Pulmonary/Chest: Effort normal and breath sounds normal. No respiratory distress. She has no wheezes. Musculoskeletal: Normal range of motion, no joint effusions. No gross deformities Neurological: She is alert and oriented to person, place, and time. No cranial nerve deficit. Coordination normal.  Skin: Skin is warm and dry. No rash noted. No erythema.  Psychiatric: She has a normal mood and affect. Her behavior is normal. Judgment and thought content normal.   Lab Results  Component Value Date   WBC 6.5 06/02/2008   HGB 14.2 06/02/2008   HCT 41.1 06/02/2008   PLT 245 06/02/2008   GLUCOSE 71 12/05/2011  CHOL 171 12/05/2011   TRIG 112.0 12/05/2011   HDL 51.60 12/05/2011   LDLCALC 97 12/05/2011   ALT 31 12/05/2011   AST 25 12/05/2011   NA 142 12/05/2011   K 3.6 12/05/2011   CL 106 12/05/2011   CREATININE 0.7 12/05/2011   BUN 13 12/05/2011   CO2 30 12/05/2011       Assessment & Plan:  See problem list. Medications and labs reviewed today.  Time spent with pt today 45 minutes, greater than 50% time spent counseling patient on insomnia, diabetes, CAD hx and medication review. Also review of prior records and ROI need from endo

## 2012-03-25 NOTE — Assessment & Plan Note (Signed)
No evidence depression/anxiety on exam Reviewed importance of sleep hygiene and habits Pt wishes to avoid "habit forming med and prior OTC meds ineffective or caused side effects  Try low dose elavil - erx done

## 2012-03-25 NOTE — Assessment & Plan Note (Signed)
S/p PTCA/stent 05/2008 No anginal symptoms The current medical regimen is effective;  continue present plan and medications.

## 2012-03-25 NOTE — Assessment & Plan Note (Signed)
DEXA followed by gyn - pt reports last done 2012 On Ca+D, WB exercises -  The current medical regimen is effective;  continue present plan and medications.

## 2012-04-09 ENCOUNTER — Ambulatory Visit (INDEPENDENT_AMBULATORY_CARE_PROVIDER_SITE_OTHER)
Admission: RE | Admit: 2012-04-09 | Discharge: 2012-04-09 | Disposition: A | Discharge status: Home / Self Care | Payer: PRIVATE HEALTH INSURANCE | Source: Ambulatory Visit | Attending: Endocrinology | Admitting: Endocrinology

## 2012-04-09 ENCOUNTER — Telehealth: Payer: Self-pay | Admitting: *Deleted

## 2012-04-09 ENCOUNTER — Ambulatory Visit (INDEPENDENT_AMBULATORY_CARE_PROVIDER_SITE_OTHER): Payer: PRIVATE HEALTH INSURANCE | Admitting: Endocrinology

## 2012-04-09 ENCOUNTER — Encounter: Payer: Self-pay | Admitting: Endocrinology

## 2012-04-09 VITALS — BP 150/72 | HR 78 | Temp 97.9°F | Ht 69.5 in | Wt 186.0 lb

## 2012-04-09 DIAGNOSIS — J069 Acute upper respiratory infection, unspecified: Secondary | ICD-10-CM

## 2012-04-09 DIAGNOSIS — R05 Cough: Secondary | ICD-10-CM

## 2012-04-09 MED ORDER — CEFUROXIME AXETIL 250 MG PO TABS: 250.0000 mg | ORAL_TABLET | Freq: Two times a day (BID) | ORAL | Status: AC

## 2012-04-09 MED ORDER — PROMETHAZINE-CODEINE 6.25-10 MG/5ML PO SYRP: 5.0000 mL | ORAL_SOLUTION | ORAL | Status: DC | PRN

## 2012-04-09 NOTE — Patient Instructions (Addendum)
A chest-x-ray is requested for you today.  You will receive a letter with results.   Here are 2 prescriptions:  Cough syrup and antibiotic.  I hope you feel better soon.  If you don't feel better by next week, please call back.  Please call sooner if you get worse.

## 2012-04-09 NOTE — Progress Notes (Signed)
Subjective:    Patient ID: [name], female    DOB: 1945/12/11, 66 y.o.   MRN: [id number]  HPI Pt states 1 week of moderate dry-quality cough in the chest, and assoc loss of voice.   Past Medical History  Diagnosis Date  . Coronary artery disease   . Hyperlipidemia   . Atrial flutter   . Type 1 diabetes mellitus on insulin therapy   . Fibroid   . Osteopenia   . Arthritis     Past Surgical History  Procedure Date  . Coronary angioplasty 12/18/2010    NORMAL LEFT VENTRICULAR SYSTOLIC FUNCTION  . Coronary angioplasty with stent placement   . Pelvic laparoscopy   . Myomectomy 1977  . Appendectomy     History   Social History  . Marital Status: Married    Spouse Name: N/A    Number of Children: N/A  . Years of Education: N/A   Occupational History  . Not on file.   Social History Main Topics  . Smoking status: Never Smoker   . Smokeless tobacco: Not on file  . Alcohol Use: 5.0 oz/week    10 drink(s) per week  . Drug Use: No  . Sexually Active: Yes    Birth Control/ Protection: Post-menopausal   Other Topics Concern  . Not on file   Social History Narrative  . No narrative on file    Current Outpatient Prescriptions on File Prior to Visit  Medication Sig Dispense Refill  . amitriptyline (ELAVIL) 10 MG tablet Take 0.5-1 tablets (5-10 mg total) by mouth at bedtime as needed for sleep.  30 tablet  0  . aspirin 325 MG tablet Take 1 tablet (325 mg total) by mouth daily. 30 minutes before niaspan      . Calcium Carbonate-Vitamin D (CALCIUM + D PO) Take 600 mg by mouth 2 (two) times daily.       . Cholecalciferol (VITAMIN D PO) Take 1,000 mg by mouth daily.       . clopidogrel (PLAVIX) 75 MG tablet Take 1 tablet (75 mg total) by mouth daily.  90 tablet  3  . digoxin (LANOXIN) 0.25 MG tablet Take 1 tablet (250 mcg total) by mouth daily.  90 tablet  3  . diltiazem (CARDIZEM CD) 120 MG 24 hr capsule Take 1 capsule (120 mg total) by mouth daily.  90 capsule  3  .  Insulin Aspart (INSULIN PUMP) 100 unit/ml SOLN Inject into the skin.        . metFORMIN (GLUCOPHAGE) 500 MG tablet Take 2,000 mg by mouth daily with supper.      . metoprolol (TOPROL-XL) 100 MG 24 hr tablet Take 1.5 tablets (150 mg total) by mouth daily.  45 tablet  1  . niacin (NIASPAN) 1000 MG CR tablet Take 1,000 mg by mouth at bedtime.      . nitroGLYCERIN (NITROSTAT) 0.4 MG SL tablet Place 1 tablet (0.4 mg total) under the tongue every 5 (five) minutes as needed.  25 tablet  11  . omega-3 acid ethyl esters (LOVAZA) 1 G capsule Take 2 g by mouth 2 (two) times daily.        . rosuvastatin (CRESTOR) 20 MG tablet Take 1 tablet (20 mg total) by mouth daily.  90 tablet  3    Allergies  Allergen Reactions  . Septra (Bactrim)   . Sulfa Antibiotics     Family History  Problem Relation Age of Onset  . Heart disease Mother   .  Diabetes Mother   . Hypertension Mother   . Asthma Mother   . Heart disease Father   . Heart disease Brother     BP 150/72  Pulse 78  Temp 97.9 F (36.6 C) (Oral)  Ht 5' 9.5" (1.765 m)  Wt 186 lb (84.369 kg)  BMI 27.07 kg/m2  SpO2 96%    Review of Systems She has bilat otalgia, but she denies chest pain    Objective:   Physical Exam VITAL SIGNS:  See vs page GENERAL: no distress head: no deformity eyes: no periorbital swelling, no proptosis external nose and ears are normal mouth: no lesion seen Both eac's and tm's are normal LUNGS:  Clear to auscultation, except for rales at the left base   Cxr: ? Interstitial edema    Assessment & Plan:  Glenford Peers, new ? Of interstitial edema Otalgia, prob due to congestion

## 2012-04-09 NOTE — Telephone Encounter (Signed)
A user error has taken place: encounter opened in error, closed for administrative reasons.

## 2012-04-15 ENCOUNTER — Ambulatory Visit (INDEPENDENT_AMBULATORY_CARE_PROVIDER_SITE_OTHER): Payer: PRIVATE HEALTH INSURANCE | Admitting: Internal Medicine

## 2012-04-15 ENCOUNTER — Encounter: Payer: Self-pay | Admitting: Internal Medicine

## 2012-04-15 VITALS — BP 130/72 | HR 69 | Temp 97.0°F | Ht 69.5 in | Wt 185.8 lb

## 2012-04-15 DIAGNOSIS — R05 Cough: Secondary | ICD-10-CM

## 2012-04-15 MED ORDER — PROMETHAZINE-CODEINE 6.25-10 MG/5ML PO SYRP: 5.0000 mL | ORAL_SOLUTION | ORAL | Status: AC | PRN

## 2012-04-15 NOTE — Progress Notes (Signed)
  Subjective:    Patient ID: BESSE MIRON, female    DOB: May 09, 1946, 66 y.o.   MRN: [id number]  HPI  Here for follow up - seen last week by SAE for cough - tx ceftin + codeine cough suppression cxr wih vasc congestion and advised to see cards Feels better - less cough, no fever, good energy No shortness of breath or edema - ?need to see cards   Also reviewed chronic medical issues -  CAD - ischemic event 05/2008 while on treadmill - s/p stent 06/01/08 - follows with cards for same - the patient reports compliance with medication(s) as prescribed. Denies adverse side effects.  DM2 - follow with endo for same - on metformin and humalog - denies symptoms hypoglycemia  Osteopenia - DEXA followed by gyn - last done 2012 - on Ca and Vit D, no bone pain, WB activity  complains of insomnia, chronic, ongoing for years - trouble falling asleep and staying asleep - denies depression or anxiety, no "stress" - admits to "restless dog" in bed with her  Past Medical History  Diagnosis Date  . Coronary artery disease   . Hyperlipidemia   . Atrial flutter   . Type 1 diabetes mellitus on insulin therapy   . Fibroid   . Osteopenia   . Arthritis    Review of Systems  Cardiovascular: Negative for chest pain or palpitations.  Neurological: Negative for dizziness or headache.      Objective:   Physical Exam  BP 130/72  Pulse 69  Temp 97 F (36.1 C) (Oral)  Ht 5' 9.5" (1.765 m)  Wt 185 lb 12.8 oz (84.278 kg)  BMI 27.04 kg/m2  SpO2 96% Wt Readings from Last 3 Encounters:  04/15/12 185 lb 12.8 oz (84.278 kg)  04/09/12 186 lb (84.369 kg)  03/25/12 187 lb 12.8 oz (85.186 kg)   Constitutional: She is overweight, but appears fit, well-developed and well-nourished. No distress. Neck: Normal range of motion. Neck supple. No JVD present. No thyromegaly present.  Cardiovascular: Normal rate, regular rhythm and normal heart sounds.  No murmur heard. No BLE edema. Pulmonary/Chest: Effort normal  and breath sounds normal. No respiratory distress. She has no wheezes.   Lab Results  Component Value Date   WBC 6.5 06/02/2008   HGB 14.2 06/02/2008   HCT 41.1 06/02/2008   PLT 245 06/02/2008   GLUCOSE 71 12/05/2011   CHOL 171 12/05/2011   TRIG 112.0 12/05/2011   HDL 51.60 12/05/2011   LDLCALC 97 12/05/2011   ALT 31 12/05/2011   AST 25 12/05/2011   NA 142 12/05/2011   K 3.6 12/05/2011   CL 106 12/05/2011   CREATININE 0.7 12/05/2011   BUN 13 12/05/2011   CO2 30 12/05/2011   Dg Chest 2 View  04/09/2012  *RADIOLOGY REPORT*  Clinical Data: Cough, hypertension.  CHEST - 2 VIEW  Comparison: None.  Findings: The cardiac silhouette mediastinum are within normal limits.  There is mild bihilar vascular fullness.  No infiltrates or effusions are identified.  The osseous structures are unremarkable.  IMPRESSION: Mild pulmonary vascular engorgement without edema.  Original Report Authenticated By: Brandon Melnick, M.D.     Assessment & Plan:  Cough - improved from last week overall, no sputum, energy improved- mild residual symptoms but no residual crackles or evidence on exam for volume overload CXR reviewed Ok for refill on syrup - done

## 2012-04-15 NOTE — Patient Instructions (Signed)
It was good to see you today. We have reviewed your prior records including tests today No need for additional antibiotics - ok to use codeine cough syrup as needed at night - Halls or robitussin daytime as needed

## 2012-05-08 ENCOUNTER — Other Ambulatory Visit: Payer: Self-pay | Admitting: Cardiology

## 2012-05-08 DIAGNOSIS — I2510 Atherosclerotic heart disease of native coronary artery without angina pectoris: Secondary | ICD-10-CM

## 2012-05-08 DIAGNOSIS — I4891 Unspecified atrial fibrillation: Secondary | ICD-10-CM

## 2012-05-08 MED ORDER — DILTIAZEM HCL ER COATED BEADS 120 MG PO CP24: 120.0000 mg | ORAL_CAPSULE | Freq: Every day | ORAL | Status: DC

## 2012-05-08 MED ORDER — CLOPIDOGREL BISULFATE 75 MG PO TABS: 75.0000 mg | ORAL_TABLET | Freq: Every day | ORAL | Status: DC

## 2012-05-08 MED ORDER — DIGOXIN 250 MCG PO TABS: 250.0000 ug | ORAL_TABLET | Freq: Every day | ORAL | Status: DC

## 2012-05-11 ENCOUNTER — Other Ambulatory Visit: Payer: Self-pay | Admitting: Cardiovascular Disease

## 2012-05-11 DIAGNOSIS — I2510 Atherosclerotic heart disease of native coronary artery without angina pectoris: Secondary | ICD-10-CM

## 2012-05-11 MED ORDER — ROSUVASTATIN CALCIUM 20 MG PO TABS: 20.0000 mg | ORAL_TABLET | Freq: Every day | ORAL | Status: DC

## 2012-05-11 NOTE — Telephone Encounter (Signed)
Fax Received. Refill Completed. [name] (R.M.A)   

## 2012-05-11 NOTE — Telephone Encounter (Signed)
[phone] [facility] mail order.

## 2012-05-20 ENCOUNTER — Telehealth: Payer: Self-pay | Admitting: Cardiovascular Disease

## 2012-05-20 DIAGNOSIS — I2510 Atherosclerotic heart disease of native coronary artery without angina pectoris: Secondary | ICD-10-CM

## 2012-05-20 MED ORDER — ROSUVASTATIN CALCIUM 20 MG PO TABS: 20.0000 mg | ORAL_TABLET | Freq: Every day | ORAL | Status: DC

## 2012-05-20 NOTE — Telephone Encounter (Signed)
Fax Received. Refill Completed. [name] (R.M.A)   

## 2012-05-20 NOTE — Telephone Encounter (Signed)
PT OUT OF CRESTOR , NEEDS REFILL CALLED INTO [facility] [phone] 90 DAY SUPPLY, AND THEN NEEDS 2 WEEKS WORTH CALLED INTO  [facility] [place] ONLY HAS TWO PILLS LEFT NEEDS ASAP

## 2012-05-21 ENCOUNTER — Telehealth: Payer: Self-pay | Admitting: Cardiovascular Disease

## 2012-05-21 DIAGNOSIS — I2510 Atherosclerotic heart disease of native coronary artery without angina pectoris: Secondary | ICD-10-CM

## 2012-05-21 MED ORDER — ROSUVASTATIN CALCIUM 20 MG PO TABS: 20.0000 mg | ORAL_TABLET | Freq: Every day | ORAL | Status: DC

## 2012-05-21 NOTE — Telephone Encounter (Signed)
Fax Received. Refill Completed. Gopal Malter Chowoe (R.M.A)   

## 2012-05-21 NOTE — Telephone Encounter (Signed)
New msg Pt wants two wk supply of rosuvastatin 20 mg called to [facility] on [address] to give her enough pills to last until mail order arrives

## 2012-08-14 ENCOUNTER — Encounter: Payer: Self-pay | Admitting: Obstetrics and Gynecology

## 2012-08-31 ENCOUNTER — Encounter: Payer: Self-pay | Admitting: Obstetrics and Gynecology

## 2012-08-31 ENCOUNTER — Ambulatory Visit (INDEPENDENT_AMBULATORY_CARE_PROVIDER_SITE_OTHER): Payer: PRIVATE HEALTH INSURANCE | Admitting: Obstetrics and Gynecology

## 2012-08-31 VITALS — BP 124/76 | Ht 69.5 in | Wt 191.0 lb

## 2012-08-31 DIAGNOSIS — E109 Type 1 diabetes mellitus without complications: Secondary | ICD-10-CM | POA: Insufficient documentation

## 2012-08-31 DIAGNOSIS — Z01419 Encounter for gynecological examination (general) (routine) without abnormal findings: Secondary | ICD-10-CM

## 2012-08-31 NOTE — Progress Notes (Signed)
Patient came to see me today for her annual GYN exam. She is doing well without hot flashes. She is having no vaginal bleeding. She is having no pelvic pain. She is up-to-date on her mammograms. She has never had an abnormal Pap smear. Her diabetes is well-controlled. Her last Pap smear was 2012. On bone density the patient has osteopenia. Her last bone density was 2010 and was improved. Her fracture risk is not elevated. She has not had any fractures.  Physical examination:[name] present. HEENT within normal limits. Neck: Thyroid not large. No masses. Supraclavicular nodes: not enlarged. Breasts: Examined in both sitting and lying  position. No skin changes and no masses. Abdomen: Soft no guarding rebound or masses or hernia. Pelvic: External: Within normal limits. BUS: Within normal limits. Vaginal:within normal limits. Good estrogen effect. No evidence of cystocele rectocele or enterocele. Cervix: clean. Uterus: Normal size and shape. Adnexa: No masses. Rectovaginal exam: Confirmatory and negative. Extremities: Within normal limits.  Assessment: Osteopenia  Plan: Scheduled bone density. Continue yearly mammograms. Lab through endocrinologist. Pap not done.The new Pap smear guidelines were discussed with the patient.

## 2012-08-31 NOTE — Patient Instructions (Signed)
Schedule  bone density. Continue yearly mammograms. 

## 2012-09-01 LAB — URINALYSIS W MICROSCOPIC + REFLEX CULTURE
Bacteria, UA: NONE SEEN
Hgb urine dipstick: NEGATIVE
Ketones, ur: NEGATIVE mg/dL
Nitrite: NEGATIVE
Specific Gravity, Urine: 1.008 (ref 1.005–1.030)
Urobilinogen, UA: 0.2 mg/dL (ref 0.0–1.0)

## 2012-09-02 LAB — URINE CULTURE: Colony Count: 1000

## 2012-09-07 ENCOUNTER — Ambulatory Visit (INDEPENDENT_AMBULATORY_CARE_PROVIDER_SITE_OTHER): Payer: PRIVATE HEALTH INSURANCE | Admitting: Cardiovascular Disease

## 2012-09-07 ENCOUNTER — Encounter: Payer: Self-pay | Admitting: Cardiovascular Disease

## 2012-09-07 VITALS — BP 110/60 | HR 69 | Ht 69.5 in | Wt 187.8 lb

## 2012-09-07 DIAGNOSIS — I4891 Unspecified atrial fibrillation: Secondary | ICD-10-CM

## 2012-09-07 DIAGNOSIS — I2510 Atherosclerotic heart disease of native coronary artery without angina pectoris: Secondary | ICD-10-CM

## 2012-09-07 MED ORDER — CLOPIDOGREL BISULFATE 75 MG PO TABS: 75.0000 mg | ORAL_TABLET | Freq: Every day | ORAL | Status: DC

## 2012-09-07 MED ORDER — DILTIAZEM HCL ER COATED BEADS 120 MG PO CP24: 120.0000 mg | ORAL_CAPSULE | Freq: Every day | ORAL | Status: DC

## 2012-09-07 NOTE — Assessment & Plan Note (Signed)
[name] is doing well.  Continue her current medications.

## 2012-09-07 NOTE — Patient Instructions (Signed)
Your physician wants you to follow-up in: 6 months You will receive a reminder letter in the mail two months in advance. If you don't receive a letter, please call our office to schedule the follow-up appointment.  Your physician has recommended you make the following change in your medication:  Stop digoxin Stop lovaza   Your physician recommends that you return for a FASTING lipid profile: 6 months

## 2012-09-07 NOTE — Progress Notes (Signed)
[name] Date of Birth  Apr 03, 1946 [facility]     [place] [facility]  [address]    [address]   [address] [place], [place]  [address]    [place], [place]  [address] [phone]  Fax  [phone]  [phone]  Fax [phone]   Problems. 1. CAD 2. Atrial Flutter 3. Diabetes Mellitus.   History of Present Illness:  [name] is doing very well. She's not having episodes of chest pain or shortness of breath. She's been able to below of her normal activities without any significant problems. She has not had chest pains .  Her insurance will run out the month before she goes on Medicare.   She has not had any CP and no palpitations.     Current Outpatient Prescriptions on File Prior to Visit  Medication Sig Dispense Refill  . amitriptyline (ELAVIL) 10 MG tablet Take 0.5-1 tablets (5-10 mg total) by mouth at bedtime as needed for sleep.  30 tablet  0  . aspirin 325 MG tablet Take 1 tablet (325 mg total) by mouth daily. 30 minutes before niaspan      . Calcium Carbonate-Vitamin D (CALCIUM + D PO) Take 600 mg by mouth 2 (two) times daily.       . Cholecalciferol (VITAMIN D PO) Take 1,000 mg by mouth daily.       . digoxin (LANOXIN) 0.25 MG tablet Take 1 tablet (250 mcg total) by mouth daily.  90 tablet  1  . Insulin Aspart (INSULIN PUMP) 100 unit/ml SOLN Inject into the skin.        . metFORMIN (GLUCOPHAGE) 500 MG tablet Take 2,000 mg by mouth daily with supper.      . metoprolol (TOPROL-XL) 100 MG 24 hr tablet Take 1.5 tablets (150 mg total) by mouth daily.  45 tablet  1  . niacin (NIASPAN) 1000 MG CR tablet Take 1,000 mg by mouth at bedtime.      . nitroGLYCERIN (NITROSTAT) 0.4 MG SL tablet Place 1 tablet (0.4 mg total) under the tongue every 5 (five) minutes as needed.  25 tablet  11  . omega-3 acid ethyl esters (LOVAZA) 1 G capsule Take 1 g by mouth 3 (three) times daily.       . pioglitazone (ACTOS) 15 MG tablet Take 15 mg by mouth daily.      .  [DISCONTINUED] clopidogrel (PLAVIX) 75 MG tablet Take 1 tablet (75 mg total) by mouth daily.  90 tablet  1  . [DISCONTINUED] diltiazem (CARDIZEM CD) 120 MG 24 hr capsule Take 1 capsule (120 mg total) by mouth daily.  90 capsule  1  . [DISCONTINUED] rosuvastatin (CRESTOR) 20 MG tablet Take 1 tablet (20 mg total) by mouth daily.  30 tablet  0    Allergies  Allergen Reactions  . Septra (Bactrim)   . Sulfa Antibiotics     Past Medical History  Diagnosis Date  . Coronary artery disease   . Hyperlipidemia   . Atrial flutter   . Osteopenia   . Arthritis   . Type 1 diabetes mellitus on insulin therapy   . Fibroid     Past Surgical History  Procedure Date  . Coronary angioplasty 12/18/2010    NORMAL LEFT VENTRICULAR SYSTOLIC FUNCTION  . Coronary angioplasty with stent placement   . Pelvic laparoscopy   . Myomectomy 1977  . Appendectomy     History  Smoking status  . Never Smoker   Smokeless tobacco  .  Not on file    History  Alcohol Use  . 3.5 oz/week  . 7 drink(s) per week    Family History  Problem Relation Age of Onset  . Heart disease Mother   . Diabetes Mother   . Hypertension Mother   . Heart disease Father   . Heart disease Brother     Reviw of Systems:  Reviewed in the HPI.  All other systems are negative.  Physical Exam: Blood pressure 110/60, pulse 69, height 5' 9.5" (1.765 m), weight 187 lb 12.8 oz (85.186 kg), SpO2 97.00%. General: Well developed, well nourished, in no acute distress.  Head: Normocephalic, atraumatic, sclera non-icteric, mucus membranes are moist,   Neck: Supple. Negative for carotid bruits. JVD not elevated.  Lungs: Clear bilaterally to auscultation without wheezes, rales, or rhonchi. Breathing is unlabored.  Heart: RRR with S1 S2. No murmurs, rubs, or gallops appreciated.  Abdomen: Soft, non-tender, non-distended with normoactive bowel sounds. No hepatomegaly. No rebound/guarding. No obvious abdominal masses.  Msk:  Strength  and tone appear normal for age.  Extremities: No clubbing or cyanosis. No edema.  Distal pedal pulses are 2+ and equal bilaterally.  Neuro: Alert and oriented X 3. Moves all extremities spontaneously.  Psych:  Responds to questions appropriately with a normal affect.  ECG: Normal sinus rhythm. She has mild nonspecific ST and T-wave changes in the inferior leads.  EKG is not significantly changed from her previous sinus rhythm EKG in 2011. The most recent EKG was in February of 2012 and showed atrial flutter.  Assessment / Plan:

## 2012-09-18 ENCOUNTER — Encounter: Payer: Self-pay | Admitting: Cardiovascular Disease

## 2012-09-21 ENCOUNTER — Encounter: Payer: Self-pay | Admitting: Cardiovascular Disease

## 2012-09-21 ENCOUNTER — Encounter: Payer: Self-pay | Admitting: Cardiology

## 2012-09-22 ENCOUNTER — Ambulatory Visit (INDEPENDENT_AMBULATORY_CARE_PROVIDER_SITE_OTHER): Payer: PRIVATE HEALTH INSURANCE

## 2012-09-22 DIAGNOSIS — M949 Disorder of cartilage, unspecified: Secondary | ICD-10-CM

## 2012-09-22 DIAGNOSIS — M8580 Other specified disorders of bone density and structure, unspecified site: Secondary | ICD-10-CM

## 2012-09-25 ENCOUNTER — Ambulatory Visit (INDEPENDENT_AMBULATORY_CARE_PROVIDER_SITE_OTHER): Payer: PRIVATE HEALTH INSURANCE | Admitting: Internal Medicine

## 2012-09-25 ENCOUNTER — Encounter: Payer: Self-pay | Admitting: Internal Medicine

## 2012-09-25 VITALS — BP 124/70 | HR 64 | Temp 97.0°F | Ht 69.5 in | Wt 192.1 lb

## 2012-09-25 DIAGNOSIS — G4700 Insomnia, unspecified: Secondary | ICD-10-CM

## 2012-09-25 DIAGNOSIS — M8580 Other specified disorders of bone density and structure, unspecified site: Secondary | ICD-10-CM

## 2012-09-25 DIAGNOSIS — F5104 Psychophysiologic insomnia: Secondary | ICD-10-CM

## 2012-09-25 DIAGNOSIS — Z794 Long term (current) use of insulin: Secondary | ICD-10-CM

## 2012-09-25 DIAGNOSIS — M949 Disorder of cartilage, unspecified: Secondary | ICD-10-CM

## 2012-09-25 DIAGNOSIS — E119 Type 2 diabetes mellitus without complications: Secondary | ICD-10-CM

## 2012-09-25 DIAGNOSIS — M7700 Medial epicondylitis, unspecified elbow: Secondary | ICD-10-CM

## 2012-09-25 NOTE — Patient Instructions (Signed)
It was good to see you today. Medications reviewed, no changes at this time. We have reviewed your prior records including labs and tests today Try using "tennis elbow band" over the counter as discussed for your elbow  Please schedule followup in 6-12 months, call sooner if problems. Medial Epicondylitis (Golfer's Elbow) with Rehab Medial epicondylitis involves inflammation and pain around the inner (medial) portion of the elbow. This pain is caused by inflammation of the tendons in the forearm that flex (bring down) the wrist. Medial epicondylitis is also called golfer's elbow, because it is common among golfers. However, it may occur in any individual who flexes the wrist regularly. If medial epicondylitis is left untreated, it may become a chronic problem. SYMPTOMS    Pain, tenderness, or inflammation over the inner (medial) side of the elbow.   Pain or weakness with gripping activities.   Pain that increases with wrist twisting motions (using a screwdriver, playing golf, bowling).  CAUSES   Medial epicondylitis is caused by inflammation of the tendons that flex the wrist. Causes of injury may include:  Chronic, repetitive stress and strain to the tendons that run from the wrist and forearm to the elbow.   Sudden strain on the forearm, including wrist snap when serving balls with racquet sports, or throwing a baseball.  RISK INCREASES WITH:  Sports or occupations that require repetitive and/or strenuous forearm and wrist movements (pitching a baseball, golfing, carpentry).   Poor wrist and forearm strength and flexibility.   Failure to warm up properly before activity.   Resuming activity before healing, rehabilitation, and conditioning are complete.  PREVENTION    Warm up and stretch properly before activity.   Maintain physical fitness:   Strength, flexibility, and endurance.   Cardiovascular fitness.   Wear and use properly fitted equipment.   Learn and use proper  technique and have a coach correct improper technique.   Wear a tennis elbow (counterforce) brace.  PROGNOSIS   The course of this condition depends on the degree of the injury. If treated properly, acute cases (symptoms lasting less than 4 weeks) are often resolved in 2 to 6 weeks. Chronic (longer lasting cases) often resolve in 3 to 6 months, but may require physical therapy. RELATED COMPLICATIONS    Frequently recurring symptoms, resulting in a chronic problem. Properly treating the problem the first time decreases frequency of recurrence.   Chronic inflammation, scarring, and partial tendon tear, requiring surgery.   Delayed healing or resolution of symptoms.  TREATMENT   Treatment first involves the use of ice and medicine, to reduce pain and inflammation. Strengthening and stretching exercises may reduce discomfort, if performed regularly. These exercises may be performed at home, if the condition is an acute injury. Chronic cases may require a referral to a physical therapist for evaluation and treatment. Your caregiver may advise a corticosteroid injection to help reduce inflammation. Rarely, surgery is needed. MEDICATION  If pain medicine is needed, nonsteroidal anti-inflammatory medicines (aspirin and ibuprofen), or other minor pain relievers (acetaminophen), are often advised.   Do not take pain medicine for 7 days before surgery.   Prescription pain relievers may be given, if your caregiver thinks they are needed. Use only as directed and only as much as you need.   Corticosteroid injections may be recommended. These injections should be reserved only for the most severe cases, because they can only be given a certain number of times.  HEAT AND COLD  Cold treatment (icing) should be applied for 10  to 15 minutes every 2 to 3 hours for inflammation and pain, and immediately after activity that aggravates your symptoms. Use ice packs or an ice massage.   Heat treatment may be used  before performing stretching and strengthening activities prescribed by your caregiver, physical therapist, or athletic trainer. Use a heat pack or a warm water soak.  SEEK MEDICAL CARE IF: Symptoms get worse or do not improve in 2 weeks, despite treatment. EXERCISES   RANGE OF MOTION (ROM) AND STRETCHING EXERCISES - Epicondylitis, Medial (Golfer's Elbow) These exercises may help you when beginning to rehabilitate your injury. Your symptoms may go away with or without further involvement from your physician, physical therapist or athletic trainer. While completing these exercises, remember:    Restoring tissue flexibility helps normal motion to return to the joints. This allows healthier, less painful movement and activity.   An effective stretch should be held for at least 30 seconds.   A stretch should never be painful. You should only feel a gentle lengthening or release in the stretched tissue.  RANGE OF MOTION  Wrist Flexion, Active-Assisted  Extend your right / left elbow with your fingers pointing down.*   Gently pull the back of your hand towards you, until you feel a gentle stretch on the top of your forearm.   Hold this position for __________ seconds.  Repeat __________ times. Complete this exercise __________ times per day.   *If directed by your physician, physical therapist or athletic trainer, complete this stretch with your elbow bent, rather than extended. RANGE OF MOTION  Wrist Extension, Active-Assisted  Extend your right / left elbow and turn your palm upwards.*   Gently pull your palm and fingertips back, so your wrist extends and your fingers point more toward the ground.   You should feel a gentle stretch on the inside of your forearm.   Hold this position for __________ seconds.  Repeat __________ times. Complete this exercise __________ times per day. *If directed by your physician, physical therapist or athletic trainer, complete this stretch with your elbow  bent, rather than extended. STRETCH  Wrist Extension   Place your right / left fingertips on a tabletop leaving your elbow slightly bent. Your fingers should point backwards.   Gently press your fingers and palm down onto the table, by straightening your elbow. You should feel a stretch on the inside of your forearm.   Hold this position for __________ seconds.  Repeat __________ times. Complete this stretch __________ times per day.   STRENGTHENING EXERCISES - Epicondylitis, Medial (Golfer's Elbow) These exercises may help you when beginning to rehabilitate your injury. They may resolve your symptoms with or without further involvement from your physician, physical therapist or athletic trainer. While completing these exercises, remember:    Muscles can gain both the endurance and the strength needed for everyday activities through controlled exercises.   Complete these exercises as instructed by your physician, physical therapist or athletic trainer. Increase the resistance and repetitions only as guided.   You may experience muscle soreness or fatigue, but the pain or discomfort you are trying to eliminate should never worsen during these exercises. If this pain does get worse, stop and make sure you are following the directions exactly. If the pain is still present after adjustments, discontinue the exercise until you can discuss the trouble with your caregiver.  STRENGTH Wrist Flexors  Sit with your right / left forearm palm-up, and fully supported on a table or countertop. Your elbow should  be resting below the height of your shoulder. Allow your wrist to extend over the edge of the surface.   Loosely holding a __________ weight, or a piece of rubber exercise band or tubing, slowly curl your hand up toward your forearm.   Hold this position for __________ seconds. Slowly lower the wrist back to the starting position in a controlled manner.  Repeat __________ times. Complete this  exercise __________ times per day.   STRENGTH  Wrist Extensors  Sit with your right / left forearm palm-down and fully supported. Your elbow should be resting below the height of your shoulder. Allow your wrist to extend over the edge of the surface.   Loosely holding a __________ weight, or a piece of rubber exercise band or tubing, slowly curl your hand up toward your forearm.   Hold this position for __________ seconds. Slowly lower the wrist back to the starting position in a controlled manner.  Repeat __________ times. Complete this exercise __________ times per day.   STRENGTH - Ulnar Deviators  Stand with a ____________________ weight in your right / left hand, or sit while holding a rubber exercise band or tubing, with your healthy arm supported on a table or countertop.   Move your wrist so that your pinkie travels toward your forearm and your thumb moves away from your forearm.   Hold this position for __________ seconds and then slowly lower the wrist back to the starting position.  Repeat __________ times. Complete this exercise __________ times per day STRENGTH - Grip   Grasp a tennis ball, a dense sponge, or a large, rolled sock in your hand.   Squeeze as hard as you can, without increasing any pain.   Hold this position for __________ seconds. Release your grip slowly.  Repeat __________ times. Complete this exercise __________ times per day.   STRENGTH  Forearm Supinators   Sit with your right / left forearm supported on a table, keeping your elbow below shoulder height. Rest your hand over the edge, palm down.   Gently grip a hammer or a soup ladle.   Without moving your elbow, slowly turn your palm and hand upward to a "thumbs-up" position.   Hold this position for __________ seconds. Slowly return to the starting position.  Repeat __________ times. Complete this exercise __________ times per day.   STRENGTH  Forearm Pronators  Sit with your right / left forearm  supported on a table, keeping your elbow below shoulder height. Rest your hand over the edge, palm up.   Gently grip a hammer or a soup ladle.   Without moving your elbow, slowly turn your palm and hand upward to a "thumbs-up" position.   Hold this position for __________ seconds. Slowly return to the starting position.  Repeat __________ times. Complete this exercise __________ times per day.   Document Released: 10/21/2005 Document Revised: 01/13/2012 Document Reviewed: 02/02/2009 [facility] Patient Information 2013 [place], [place].

## 2012-09-25 NOTE — Progress Notes (Signed)
  Subjective:    Patient ID: [name], female    DOB: 29-Jun-1946, 66 y.o.   MRN: [id number]  HPI  Here for follow up - reviewed chronic medical issues -  CAD - ischemic event 05/2008 while on treadmill - s/p stent 06/01/08 - follows with cards for same - she reports compliance with medication(s) as prescribed. Denies adverse side effects.  DM2, insulin-dependent - follows with endo for same - on metformin, actos and humalog pump- denies symptoms hypoglycemia  Osteopenia - DEXA previously followed by gyn - last done 2012 - on Ca and Vit D, no bone pain, WB activity  complains of insomnia, chronic, ongoing for years - trouble falling asleep and staying asleep - denies depression or anxiety, no "stress" - admits to "restless dog" in bed with her -ineffective relief with amitriptyline. Declines "habit-forming medication"  Also complains of left inner elbow pain x3 weeks Denies overuse or precipitating injury. Not associated with swelling  Past Medical History  Diagnosis Date  . Coronary artery disease   . Hyperlipidemia   . Atrial flutter   . Osteopenia   . Arthritis   . Type 1 diabetes mellitus on insulin therapy   . Fibroid    Review of Systems  Constitutional: Negative for fever or weight change.  Respiratory: Negative for cough and shortness of breath.   Cardiovascular: Negative for chest pain or palpitations.      Objective:   Physical Exam  BP 124/70  Pulse 64  Temp 97 F (36.1 C) (Oral)  Ht 5' 9.5" (1.765 m)  Wt 192 lb 1.9 oz (87.145 kg)  BMI 27.96 kg/m2  SpO2 97% Weight: 192 lb 1.9 oz (87.145 kg)  Constitutional: She appears fit, well-developed and well-nourished. No distress Cardiovascular: Normal rate, regular rhythm and normal heart sounds.  No murmur heard. No BLE edema. Pulmonary/Chest: Effort normal and breath sounds normal. No respiratory distress. She has no wheezes. Musculoskeletal: Left elbow with mild tenderness to palpation over medial epicondyle.  No redness, bursitis, gross deformity. Full range of motion with ligamentous function intact. no joint effusions. No gross deformities Psychiatric: She has a normal mood and affect. Her behavior is normal. Judgment and thought content normal.   Lab Results  Component Value Date   WBC 6.5 06/02/2008   HGB 14.2 06/02/2008   HCT 41.1 06/02/2008   PLT 245 06/02/2008   GLUCOSE 71 12/05/2011   CHOL 171 12/05/2011   TRIG 112.0 12/05/2011   HDL 51.60 12/05/2011   LDLCALC 97 12/05/2011   ALT 31 12/05/2011   AST 25 12/05/2011   NA 142 12/05/2011   K 3.6 12/05/2011   CL 106 12/05/2011   CREATININE 0.7 12/05/2011   BUN 13 12/05/2011   CO2 30 12/05/2011       Assessment & Plan:  See problem list. Medications and labs reviewed today.  Medial epicondylitis- Education on diagnosis provided, conservative care recommended. Physical therapy/stretching exercises provided as well as information on diagnosis. Patient to call for referral if symptoms worse or unimproved

## 2012-09-26 NOTE — Assessment & Plan Note (Signed)
Follows with endo [name] defer mgmt to his care The current medical regimen is effective;  continue present plan and medications.

## 2012-09-26 NOTE — Assessment & Plan Note (Signed)
DEXA previously followed by gyn - pt reports last done 2012 On Ca+D, WB exercises -  The current medical regimen is effective;  continue present plan and medications.

## 2012-09-26 NOTE — Assessment & Plan Note (Signed)
No evidence depression/anxiety on exam Reviewed importance of sleep hygiene and habits Pt wishes to avoid "habit forming med and prior OTC meds ineffective or caused side effects  Ineffective trial low dose elavil 03/2012 -does not wish to try alternate medicine this time, but agrees to call if lack of sleep interfering with daytime responsibilities 

## 2012-10-22 ENCOUNTER — Other Ambulatory Visit: Payer: Self-pay | Admitting: Cardiovascular Disease

## 2012-10-22 DIAGNOSIS — I2510 Atherosclerotic heart disease of native coronary artery without angina pectoris: Secondary | ICD-10-CM

## 2012-10-22 DIAGNOSIS — I4891 Unspecified atrial fibrillation: Secondary | ICD-10-CM

## 2012-10-22 MED ORDER — CLOPIDOGREL BISULFATE 75 MG PO TABS: 75.0000 mg | ORAL_TABLET | Freq: Every day | ORAL | Status: DC

## 2012-10-22 MED ORDER — DILTIAZEM HCL ER COATED BEADS 120 MG PO CP24: 120.0000 mg | ORAL_CAPSULE | Freq: Every day | ORAL | Status: DC

## 2012-11-03 ENCOUNTER — Other Ambulatory Visit: Payer: Self-pay | Admitting: Cardiology

## 2012-11-03 DIAGNOSIS — I2510 Atherosclerotic heart disease of native coronary artery without angina pectoris: Secondary | ICD-10-CM

## 2012-11-03 MED ORDER — METOPROLOL SUCCINATE ER 100 MG PO TB24: ORAL_TABLET | ORAL | Status: DC

## 2012-11-04 LAB — HM PAP SMEAR

## 2012-11-12 ENCOUNTER — Telehealth: Payer: Self-pay | Admitting: Internal Medicine

## 2012-11-12 NOTE — Telephone Encounter (Signed)
Patient Information:  Caller Name: Amare  Phone: [phone]  Patient: [name], [name]  Gender: Female  DOB: [birth year]  Age: 67 Years  PCP: Rene Paci (Adults only)  Office Follow Up:  Does the office need to follow up with this patient?: No  Instructions For The Office: N/A   Symptoms  Reason For Call & Symptoms: Patient states she has congestion, voice hoarseness, non productive cough  Reviewed Health History In EMR: Yes  Reviewed Medications In EMR: Yes  Reviewed Allergies In EMR: Yes  Reviewed Surgeries / Procedures: No  Date of Onset of Symptoms: 11/05/2012  Guideline(s) Used:  Colds  Disposition Per Guideline:   Home Care  Reason For Disposition Reached:   Colds with no complications  Advice Given:  Reassurance  It sounds like an uncomplicated cold that we can treat at home.  Colds are very common and may make you feel uncomfortable.  Colds are caused by viruses, and no medicine or "shot" will cure an uncomplicated cold.  Colds are usually not serious.  Here is some care advice that should help.  For a Runny Nose With Profuse Discharge:   Nasal mucus and discharge helps to wash viruses and bacteria out of the nose and sinuses.  Blowing the nose is all that is needed.  If the skin around your nostrils gets irritated, apply a tiny amount of petroleum ointment to the nasal openings once or twice a day.  For a Stuffy Nose - Use Nasal Washes:  Introduction: Saline (salt water) nasal irrigation (nasal wash) is an effective and simple home remedy for treating stuffy nose and sinus congestion. The nose can be irrigated by pouring, spraying, or squirting salt water into the nose and then letting it run back out.  How it Helps: The salt water rinses out excess mucus, washes out any irritants (dust, allergens) that might be present, and moistens the nasal cavity.  Methods: There are several ways to perform nasal irrigation. You can use a saline nasal spray bottle  (available over-the-counter), a rubber ear syringe, a medical syringe without the needle, or a Neti Pot.  Humidifier:  If the air in your home is dry, use a cool-mist humidifier  Treatment for Associated Symptoms of Colds:  Sore throat: Try throat lozenges, hard candy, or warm chicken broth.  Cough: Use cough drops.  Hydrate: Drink adequate liquids.  Treatment for Associated Symptoms of Colds:  For muscle aches, headaches, or moderate fever (more than 101 F or 38.9 C): Take acetaminophen every 4 hours.  Sore throat: Try throat lozenges, hard candy, or warm chicken broth.  Cough: Use cough drops.  Hydrate: Drink adequate liquids.  Expected Course:   Fever may last 2-3 days  Nasal discharge 7-14 days  Cough up to 2-3 weeks.  Vitamin C:   A number of experts, including [employer], have promoted taking high doses of this vitamin as a treatment for the common cold.  Research to date shows that vitamin C has minimal (if any) effect on the duration or degree of cold symptoms. Thus, it cannot be recommended as a treatment.  Vitamin C is probably harmless in standard doses (less than 2 gm daily).  Echinacea  : There is no proven benefit of using this herbal remedy in treating or preventing the common cold. In fact, current research suggests that it does not help.  Read the package instructions thoroughly on all supplements that you take.

## 2012-12-10 DIAGNOSIS — E109 Type 1 diabetes mellitus without complications: Secondary | ICD-10-CM | POA: Diagnosis not present

## 2012-12-10 DIAGNOSIS — H524 Presbyopia: Secondary | ICD-10-CM | POA: Diagnosis not present

## 2012-12-16 DIAGNOSIS — E785 Hyperlipidemia, unspecified: Secondary | ICD-10-CM | POA: Diagnosis not present

## 2012-12-16 DIAGNOSIS — E1065 Type 1 diabetes mellitus with hyperglycemia: Secondary | ICD-10-CM | POA: Diagnosis not present

## 2012-12-21 DIAGNOSIS — E785 Hyperlipidemia, unspecified: Secondary | ICD-10-CM | POA: Diagnosis not present

## 2012-12-21 DIAGNOSIS — E1065 Type 1 diabetes mellitus with hyperglycemia: Secondary | ICD-10-CM | POA: Diagnosis not present

## 2012-12-24 DIAGNOSIS — E119 Type 2 diabetes mellitus without complications: Secondary | ICD-10-CM | POA: Diagnosis not present

## 2012-12-24 DIAGNOSIS — H31009 Unspecified chorioretinal scars, unspecified eye: Secondary | ICD-10-CM | POA: Diagnosis not present

## 2012-12-24 DIAGNOSIS — H2510 Age-related nuclear cataract, unspecified eye: Secondary | ICD-10-CM | POA: Diagnosis not present

## 2013-01-07 ENCOUNTER — Other Ambulatory Visit: Payer: Self-pay

## 2013-01-07 DIAGNOSIS — I2510 Atherosclerotic heart disease of native coronary artery without angina pectoris: Secondary | ICD-10-CM

## 2013-01-07 MED ORDER — DILTIAZEM HCL ER COATED BEADS 120 MG PO CP24: 120.0000 mg | ORAL_CAPSULE | Freq: Every day | ORAL | Status: DC

## 2013-01-07 MED ORDER — METOPROLOL SUCCINATE ER 100 MG PO TB24: ORAL_TABLET | ORAL | Status: DC

## 2013-01-07 NOTE — Telephone Encounter (Signed)
Pt states she has continueously called refill line and no one has returned her call. She is now requesting mail order from [facility] in which she is asking for a 90 day supply DONE  DJ

## 2013-01-22 DIAGNOSIS — L719 Rosacea, unspecified: Secondary | ICD-10-CM | POA: Diagnosis not present

## 2013-01-22 DIAGNOSIS — L578 Other skin changes due to chronic exposure to nonionizing radiation: Secondary | ICD-10-CM | POA: Diagnosis not present

## 2013-01-25 DIAGNOSIS — H2510 Age-related nuclear cataract, unspecified eye: Secondary | ICD-10-CM | POA: Diagnosis not present

## 2013-01-26 DIAGNOSIS — H2510 Age-related nuclear cataract, unspecified eye: Secondary | ICD-10-CM | POA: Diagnosis not present

## 2013-02-01 DIAGNOSIS — H2510 Age-related nuclear cataract, unspecified eye: Secondary | ICD-10-CM | POA: Diagnosis not present

## 2013-02-01 DIAGNOSIS — H269 Unspecified cataract: Secondary | ICD-10-CM | POA: Diagnosis not present

## 2013-02-08 ENCOUNTER — Other Ambulatory Visit: Payer: Self-pay | Admitting: *Deleted

## 2013-02-08 DIAGNOSIS — I2510 Atherosclerotic heart disease of native coronary artery without angina pectoris: Secondary | ICD-10-CM

## 2013-02-08 DIAGNOSIS — I4891 Unspecified atrial fibrillation: Secondary | ICD-10-CM

## 2013-02-08 MED ORDER — DILTIAZEM HCL ER COATED BEADS 120 MG PO CP24: 120.0000 mg | ORAL_CAPSULE | Freq: Every day | ORAL | Status: DC

## 2013-02-08 MED ORDER — METOPROLOL SUCCINATE ER 100 MG PO TB24: ORAL_TABLET | ORAL | Status: DC

## 2013-02-15 DIAGNOSIS — E1065 Type 1 diabetes mellitus with hyperglycemia: Secondary | ICD-10-CM | POA: Diagnosis not present

## 2013-02-16 ENCOUNTER — Telehealth: Payer: Self-pay | Admitting: Internal Medicine

## 2013-02-16 ENCOUNTER — Encounter: Payer: Self-pay | Admitting: Internal Medicine

## 2013-02-16 ENCOUNTER — Ambulatory Visit (INDEPENDENT_AMBULATORY_CARE_PROVIDER_SITE_OTHER): Payer: Medicare Other | Admitting: Internal Medicine

## 2013-02-16 VITALS — BP 122/62 | HR 68 | Temp 97.3°F

## 2013-02-16 DIAGNOSIS — R350 Frequency of micturition: Secondary | ICD-10-CM

## 2013-02-16 DIAGNOSIS — IMO0001 Reserved for inherently not codable concepts without codable children: Secondary | ICD-10-CM

## 2013-02-16 DIAGNOSIS — R300 Dysuria: Secondary | ICD-10-CM

## 2013-02-16 DIAGNOSIS — N390 Urinary tract infection, site not specified: Secondary | ICD-10-CM | POA: Diagnosis not present

## 2013-02-16 LAB — POCT URINALYSIS DIPSTICK
Glucose, UA: NEGATIVE
Protein, UA: NEGATIVE
Spec Grav, UA: 1.015
Urobilinogen, UA: 0.2

## 2013-02-16 MED ORDER — CIPROFLOXACIN HCL 500 MG PO TABS: 500.0000 mg | ORAL_TABLET | Freq: Two times a day (BID) | ORAL | Status: DC

## 2013-02-16 NOTE — Progress Notes (Signed)
HPI  Pt presents to the clinic today with c/o urinary frequency and dysuria. This started yesterday. She denies fever ,chills, nausea, vomiting or back aches. She has had UTI's in the past and she reports this feels the same. She has not taken anything OTC but has tried to increase her fluids.   Review of Systems  Past Medical History  Diagnosis Date  . Coronary artery disease   . Hyperlipidemia   . Atrial flutter   . Osteopenia   . Arthritis   . Type 1 diabetes mellitus on insulin therapy   . Fibroid     Family History  Problem Relation Age of Onset  . Heart disease Mother   . Diabetes Mother   . Hypertension Mother   . Heart disease Father   . Heart disease Brother     History   Social History  . Marital Status: Married    Spouse Name: N/A    Number of Children: N/A  . Years of Education: N/A   Occupational History  . Not on file.   Social History Main Topics  . Smoking status: Never Smoker   . Smokeless tobacco: Not on file  . Alcohol Use: 3.5 oz/week    7 drink(s) per week  . Drug Use: No  . Sexually Active: Yes    Birth Control/ Protection: Post-menopausal   Other Topics Concern  . Not on file   Social History Narrative  . No narrative on file    Allergies  Allergen Reactions  . Septra (Bactrim)   . Sulfa Antibiotics     Constitutional: Denies fever, malaise, fatigue, headache or abrupt weight changes.   GU: Pt reports urgency, frequency and pain with urination. Denies burning sensation, blood in urine, odor or discharge. Skin: Denies redness, rashes, lesions or ulcercations.   No other specific complaints in a complete review of systems (except as listed in HPI above).    Objective:   Physical Exam  BP 122/62  Pulse 68  Temp(Src) 97.3 F (36.3 C) (Oral)  SpO2 97% Wt Readings from Last 3 Encounters:  09/25/12 192 lb 1.9 oz (87.145 kg)  09/07/12 187 lb 12.8 oz (85.186 kg)  08/31/12 191 lb (86.637 kg)    General: Appears her stated  age, well developed, well nourished in NAD. Cardiovascular: Normal rate and rhythm. S1,S2 noted.  No murmur, rubs or gallops noted. No JVD or BLE edema. No carotid bruits noted. Pulmonary/Chest: Normal effort and positive vesicular breath sounds. No respiratory distress. No wheezes, rales or ronchi noted.  Abdomen: Soft and nontender. Normal bowel sounds, no bruits noted. No distention or masses noted. Liver, spleen and kidneys non palpable. Tender to palpation over the bladder area. No CVA tenderness.      Assessment & Plan:   Urinary frequency, and dysuria secondary to UTI, new onset  eRx sent if for Cipro 500 mg BID x 5 days eRx sent in for Pyridium 200 mg TID prn Drink plenty of fluids  RTC as needed or if symptoms persist.

## 2013-02-16 NOTE — Telephone Encounter (Signed)
Patient Information:  Caller Name: Annalena  Phone: [phone]  Patient: [name], [name]  Gender: Female  DOB: 1946-06-04  Age: 67 Years  PCP: Rene Paci (Adults only)  Office Follow Up:  Does the office need to follow up with this patient?: No  Instructions For The Office: N/A   Symptoms  Reason For Call & Symptoms: "I think I have a UTI". Onset yesterday 02/15/13.  She is having burning and frequency with urination. Last UOP- 30 minutes ago.  Reviewed Health History In EMR: Yes  Reviewed Medications In EMR: Yes  Reviewed Allergies In EMR: Yes  Reviewed Surgeries / Procedures: Yes  Date of Onset of Symptoms: 02/15/2013  Guideline(s) Used:  Urination Pain - Female  Disposition Per Guideline:   See Today in Office  Reason For Disposition Reached:   Age > 50 years  Advice Given:  Fluids:   Drink extra fluids. Drink 8-10 glasses of liquids a day (Reason: to produce a dilute, non-irritating urine).  Call Back If:  You become worse.  Warm Saline SITZ Baths to Reduce Pain:  Sit in a warm saline bath for 20 minutes to cleanse the area and to reduce pain. Add 2 oz. of table salt or baking soda to a tub of water.  Patient Will Follow Care Advice:  YES  Appointment Scheduled:  02/16/2013 16:00:00 Appointment Scheduled Provider:  Nicki Reaper

## 2013-02-16 NOTE — Patient Instructions (Signed)
Urinary Tract Infection Urinary tract infections (UTIs) can develop anywhere along your urinary tract. Your urinary tract is your body's drainage system for removing wastes and extra water. Your urinary tract includes two kidneys, two ureters, a bladder, and a urethra. Your kidneys are a pair of bean-shaped organs. Each kidney is about the size of your fist. They are located below your ribs, one on each side of your spine. CAUSES Infections are caused by microbes, which are microscopic organisms, including fungi, viruses, and bacteria. These organisms are so small that they can only be seen through a microscope. Bacteria are the microbes that most commonly cause UTIs. SYMPTOMS  Symptoms of UTIs may vary by age and gender of the patient and by the location of the infection. Symptoms in young women typically include a frequent and intense urge to urinate and a painful, burning feeling in the bladder or urethra during urination. Older women and men are more likely to be tired, shaky, and weak and have muscle aches and abdominal pain. A fever may mean the infection is in your kidneys. Other symptoms of a kidney infection include pain in your back or sides below the ribs, nausea, and vomiting. DIAGNOSIS To diagnose a UTI, your caregiver will ask you about your symptoms. Your caregiver also will ask to provide a urine sample. The urine sample will be tested for bacteria and white blood cells. White blood cells are made by your body to help fight infection. TREATMENT  Typically, UTIs can be treated with medication. Because most UTIs are caused by a bacterial infection, they usually can be treated with the use of antibiotics. The choice of antibiotic and length of treatment depend on your symptoms and the type of bacteria causing your infection. HOME CARE INSTRUCTIONS  If you were prescribed antibiotics, take them exactly as your caregiver instructs you. Finish the medication even if you feel better after you  have only taken some of the medication.  Drink enough water and fluids to keep your urine clear or pale yellow.  Avoid caffeine, tea, and carbonated beverages. They tend to irritate your bladder.  Empty your bladder often. Avoid holding urine for long periods of time.  Empty your bladder before and after sexual intercourse.  After a bowel movement, women should cleanse from front to back. Use each tissue only once. SEEK MEDICAL CARE IF:   You have back pain.  You develop a fever.  Your symptoms do not begin to resolve within 3 days. SEEK IMMEDIATE MEDICAL CARE IF:   You have severe back pain or lower abdominal pain.  You develop chills.  You have nausea or vomiting.  You have continued burning or discomfort with urination. MAKE SURE YOU:   Understand these instructions.  Will watch your condition.  Will get help right away if you are not doing well or get worse. Document Released: 07/31/2005 Document Revised: 04/21/2012 Document Reviewed: 11/29/2011 [employer] Patient Information 2013 [employer].  

## 2013-03-16 DIAGNOSIS — E1065 Type 1 diabetes mellitus with hyperglycemia: Secondary | ICD-10-CM | POA: Diagnosis not present

## 2013-03-18 DIAGNOSIS — E1065 Type 1 diabetes mellitus with hyperglycemia: Secondary | ICD-10-CM | POA: Diagnosis not present

## 2013-03-18 DIAGNOSIS — I2510 Atherosclerotic heart disease of native coronary artery without angina pectoris: Secondary | ICD-10-CM | POA: Diagnosis not present

## 2013-03-18 DIAGNOSIS — E785 Hyperlipidemia, unspecified: Secondary | ICD-10-CM | POA: Diagnosis not present

## 2013-03-18 DIAGNOSIS — E1149 Type 2 diabetes mellitus with other diabetic neurological complication: Secondary | ICD-10-CM | POA: Diagnosis not present

## 2013-04-08 ENCOUNTER — Encounter: Payer: Self-pay | Admitting: Cardiovascular Disease

## 2013-04-08 ENCOUNTER — Ambulatory Visit (INDEPENDENT_AMBULATORY_CARE_PROVIDER_SITE_OTHER): Payer: Medicare Other | Admitting: Cardiovascular Disease

## 2013-04-08 VITALS — BP 122/66 | HR 63 | Ht 69.5 in | Wt 199.0 lb

## 2013-04-08 DIAGNOSIS — E785 Hyperlipidemia, unspecified: Secondary | ICD-10-CM | POA: Diagnosis not present

## 2013-04-08 DIAGNOSIS — I2510 Atherosclerotic heart disease of native coronary artery without angina pectoris: Secondary | ICD-10-CM

## 2013-04-08 DIAGNOSIS — I4892 Unspecified atrial flutter: Secondary | ICD-10-CM | POA: Diagnosis not present

## 2013-04-08 LAB — BASIC METABOLIC PANEL
BUN: 13 mg/dL (ref 6–23)
GFR: 86.32 mL/min (ref >60.00)
Glucose, Bld: 74 mg/dL (ref 70–99)
Potassium: 4.1 mEq/L (ref 3.5–5.1)

## 2013-04-08 LAB — HEPATIC FUNCTION PANEL
ALT: 24 U/L (ref 0–35)
Bilirubin, Direct: 0 mg/dL (ref 0.0–0.3)
Total Bilirubin: 0.5 mg/dL (ref 0.3–1.2)

## 2013-04-08 LAB — LIPID PANEL: VLDL: 11 mg/dL (ref 0.0–40.0)

## 2013-04-08 NOTE — Patient Instructions (Addendum)
Your physician recommends that you return for a FASTING lipid profile: today and in 6 months   Your physician wants you to follow-up in: 6 months  You will receive a reminder letter in the mail two months in advance. If you don't receive a letter, please call our office to schedule the follow-up appointment.  Your physician recommends that you continue on your current medications as directed. Please refer to the Current Medication list given to you today.  

## 2013-04-08 NOTE — Assessment & Plan Note (Signed)
[name] is doing well.  She has not had any chest pain.  .  I have asked her to exercise more.

## 2013-04-08 NOTE — Progress Notes (Signed)
Suzi Roots Date of Birth  April 28, 1946 [facility]     [place] [facility]  [address]    [address]   [address] [place], [place]  [address]    [place], [place]  [address] [phone]  Fax  [phone]  [phone]  Fax [phone]   Problems. 1. CAD 2. Atrial Flutter 3. Diabetes Mellitus.   History of Present Illness:  Dalayna is doing very well. She's not having episodes of chest pain or shortness of breath. She's been able to below of her normal activities without any significant problems. She has not had chest pains .  Her insurance will run out the month before she goes on Medicare.   She has not had any CP and no palpitations.    April 08, 2013:   Caresse is doing well. No CP.  Rhythm is stable.     Current Outpatient Prescriptions on File Prior to Visit  Medication Sig Dispense Refill  . aspirin 325 MG tablet Take 1 tablet (325 mg total) by mouth daily. 30 minutes before niaspan      . Calcium Carbonate-Vitamin D (CALCIUM + D PO) Take 600 mg by mouth 2 (two) times daily.       . Cholecalciferol (VITAMIN D PO) Take 1,000 mg by mouth daily.       . clopidogrel (PLAVIX) 75 MG tablet Take 1 tablet (75 mg total) by mouth daily.  90 tablet  3  . diltiazem (CARDIZEM CD) 120 MG 24 hr capsule Take 1 capsule (120 mg total) by mouth daily.  90 capsule  3  . Insulin Aspart (INSULIN PUMP) 100 unit/ml SOLN Inject into the skin.        . metoprolol succinate (TOPROL-XL) 100 MG 24 hr tablet Take 1.5 tablet by mouth daily  135 tablet  3  . niacin (NIASPAN) 1000 MG CR tablet Take 1,000 mg by mouth at bedtime.      . nitroGLYCERIN (NITROSTAT) 0.4 MG SL tablet Place 1 tablet (0.4 mg total) under the tongue every 5 (five) minutes as needed.  25 tablet  11  . pioglitazone (ACTOS) 15 MG tablet Take 15 mg by mouth daily.      . rosuvastatin (CRESTOR) 40 MG tablet Take 40 mg by mouth daily.      . [CANCELED] ciprofloxacin (CIPRO) 500 MG tablet Take 1 tablet (500 mg total) by  mouth 2 (two) times daily.  10 tablet  0   No current facility-administered medications on file prior to visit.    Allergies  Allergen Reactions  . Septra (Bactrim)   . Sulfa Antibiotics     Past Medical History  Diagnosis Date  . Coronary artery disease   . Hyperlipidemia   . Atrial flutter   . Osteopenia   . Arthritis   . Type 1 diabetes mellitus on insulin therapy   . Fibroid     Past Surgical History  Procedure Laterality Date  . Coronary angioplasty  12/18/2010    NORMAL LEFT VENTRICULAR SYSTOLIC FUNCTION  . Coronary angioplasty with stent placement    . Pelvic laparoscopy    . Myomectomy  1977  . Appendectomy      History  Smoking status  . Never Smoker   Smokeless tobacco  . Not on file    History  Alcohol Use  . 3.5 oz/week  . 7 drink(s) per week    Family History  Problem Relation Age of Onset  . Heart disease Mother   .  Diabetes Mother   . Hypertension Mother   . Heart disease Father   . Heart disease Brother     Reviw of Systems:  Reviewed in the HPI.  All other systems are negative.  Physical Exam: Blood pressure 122/66, pulse 63, height 5' 9.5" (1.765 m), weight 199 lb (90.266 kg). General: Well developed, well nourished, in no acute distress.  Head: Normocephalic, atraumatic, sclera non-icteric, mucus membranes are moist,   Neck: Supple. Negative for carotid bruits. JVD not elevated.  Lungs: Clear bilaterally to auscultation without wheezes, rales, or rhonchi. Breathing is unlabored.  Heart: RRR with S1 S2. No murmurs, rubs, or gallops appreciated.  Abdomen: Soft, non-tender, non-distended with normoactive bowel sounds. No hepatomegaly. No rebound/guarding. No obvious abdominal masses.  Msk:  Strength and tone appear normal for age.  Extremities: No clubbing or cyanosis. No edema.  Distal pedal pulses are 2+ and equal bilaterally.  Neuro: Alert and oriented X 3. Moves all extremities spontaneously.  Psych:  Responds to  questions appropriately with a normal affect.  ECG: April 08, 2013:  NSR at 72. No ST or T wave changes.  Assessment / Plan:

## 2013-05-03 DIAGNOSIS — L719 Rosacea, unspecified: Secondary | ICD-10-CM | POA: Diagnosis not present

## 2013-05-04 ENCOUNTER — Other Ambulatory Visit: Payer: Self-pay | Admitting: Endocrinology

## 2013-05-04 DIAGNOSIS — E1065 Type 1 diabetes mellitus with hyperglycemia: Secondary | ICD-10-CM

## 2013-05-11 ENCOUNTER — Other Ambulatory Visit (INDEPENDENT_AMBULATORY_CARE_PROVIDER_SITE_OTHER): Payer: Medicare Other

## 2013-05-11 DIAGNOSIS — IMO0002 Reserved for concepts with insufficient information to code with codable children: Secondary | ICD-10-CM

## 2013-05-11 DIAGNOSIS — E1065 Type 1 diabetes mellitus with hyperglycemia: Secondary | ICD-10-CM | POA: Diagnosis not present

## 2013-05-11 LAB — URINALYSIS
Leukocytes, UA: NEGATIVE
Nitrite: NEGATIVE
Total Protein, Urine: NEGATIVE
pH: 6 (ref 5.0–8.0)

## 2013-05-11 LAB — COMPREHENSIVE METABOLIC PANEL
AST: 22 U/L (ref 0–37)
Alkaline Phosphatase: 79 U/L (ref 39–117)
BUN: 15 mg/dL (ref 6–23)
Calcium: 9 mg/dL (ref 8.4–10.5)
Creatinine, Ser: 0.8 mg/dL (ref 0.4–1.2)
Glucose, Bld: 105 mg/dL — ABNORMAL HIGH (ref 70–99)

## 2013-05-11 LAB — MICROALBUMIN / CREATININE URINE RATIO: Microalb, Ur: 0.1 mg/dL (ref 0.0–1.9)

## 2013-05-12 ENCOUNTER — Ambulatory Visit (INDEPENDENT_AMBULATORY_CARE_PROVIDER_SITE_OTHER): Payer: Medicare Other | Admitting: Endocrinology

## 2013-05-12 ENCOUNTER — Other Ambulatory Visit: Payer: Self-pay | Admitting: *Deleted

## 2013-05-12 ENCOUNTER — Encounter: Payer: Self-pay | Admitting: Endocrinology

## 2013-05-12 VITALS — BP 132/82 | HR 85 | Ht 69.0 in | Wt 199.7 lb

## 2013-05-12 DIAGNOSIS — I2510 Atherosclerotic heart disease of native coronary artery without angina pectoris: Secondary | ICD-10-CM | POA: Diagnosis not present

## 2013-05-12 DIAGNOSIS — E785 Hyperlipidemia, unspecified: Secondary | ICD-10-CM

## 2013-05-12 DIAGNOSIS — E1065 Type 1 diabetes mellitus with hyperglycemia: Secondary | ICD-10-CM

## 2013-05-12 DIAGNOSIS — E108 Type 1 diabetes mellitus with unspecified complications: Secondary | ICD-10-CM | POA: Insufficient documentation

## 2013-05-12 LAB — FRUCTOSAMINE: Fructosamine: 278 umol/L (ref <285)

## 2013-05-12 MED ORDER — ROSUVASTATIN CALCIUM 40 MG PO TABS: 40.0000 mg | ORAL_TABLET | Freq: Every day | ORAL | Status: DC

## 2013-05-12 MED ORDER — NIACIN ER (ANTIHYPERLIPIDEMIC) 1000 MG PO TBCR: 1000.0000 mg | EXTENDED_RELEASE_TABLET | Freq: Every day | ORAL | Status: DC

## 2013-05-12 NOTE — Progress Notes (Signed)
Patient ID: [name], female   DOB: [birth year], 67 y.o.   MRN: [id number]  HISTORY:  Diabetes type 1:   Insulin Pump: CURRENT brand:  Ping The pump settings are Basal rate: Midnight = 1.8. 2 AM = 1.6; 5 a.m. = 1.9; 11 AM = 1.6. 5 p.m. = 2.7. 11 p.m. = 2.3 Carbohydrate ratio 1:9 until 5 PM and then 1:6; sensitivity 1:30, 25 after 5 p.m.; target 105. Insulin on board duration 4 hrs, maximum total daily dose 200 units     Reason for visit 2 month followup.   Diagnosis: Type 1 diabetes mellitus, date of diagnosis: 1979.  Monitors blood glucose: on an average 3.5 times a day according to pump download.  Glucometer: One Touch.  Blood Glucose readings: Overnight 34-300 but usually relatively low and average 114. Before breakfast 62-224 with average around 140, mid day 56, 188, around supper time 58-332 with an average about 221 and late night average 110 26% of readings above target and 33% below  Hypoglycemic awareness: Has symptoms of feeling tired, headache, sweaty., She has symptoms when blood glucose is less than 60. Uses glucose tablets for treatment when not at home but otherwise eats a snack .  Meals: 3 meals per day.  Food preferences: Yogurt And cottage cheese for breakfast usually. Marland Kitchen  Physical activity: exercise: Not much recently except house work, after exercise blood sugar will get lower in 1-2 hrs..  Certified Diabetes Educator visit: Most recent: 8/12.  Retinal exam: Most recent:, Unknown.  Diabetes Mellitus:  The patient is seen today in follow up of Type 1 diabetes   DIABETES: She has had long-standing diabetes with A1c levels usually above target. She had relatively high readings in 2013. In 10/13 ACTOS was restarted because of her large insulin requirement and diabetic dyslipidemia. Her blood sugars since then have improved significantly and basal rates were reduced.   RECENT history:. She is again having significant hypoglycemia that now it is mostly overnight compared  to daytime low readings the previous time when basal rates were reduced . She has not changed her settings on her own and did not call to report low sugars. Also has frequent high readings before supper her which she thinks could be from snacking but occasionally will have relatively low readings also DIET has been relatively inconsistent overall and her mealtimes are variable. Frequently will not eat lunch. Usually trying to have low fat meals COMPLIANCE with boluses has been fairly good although rarely will still forget. She is again not entering carbohydrates in the pump for her meals and not clear if she is following the pump settings for carb ratios  The last HbgA1c was 6.9 in 5/14. Previously was 7.2 In 2/14,  Fructosamine  is 278 this time and indicates fairly good control  MICROALBUMIN has been tested, and the result is normal .  Appointment on 05/11/2013  Component Date Value Range Status  . Sodium 05/11/2013 141  135 - 145 mEq/L Final  . Potassium 05/11/2013 4.2  3.5 - 5.1 mEq/L Final  . Chloride 05/11/2013 106  96 - 112 mEq/L Final  . CO2 05/11/2013 26  19 - 32 mEq/L Final  . Glucose, Bld 05/11/2013 105* 70 - 99 mg/dL Final  . BUN 30/86/5784 15  6 - 23 mg/dL Final  . Creatinine, Ser 05/11/2013 0.8  0.4 - 1.2 mg/dL Final  . Total Bilirubin 05/11/2013 0.6  0.3 - 1.2 mg/dL Final  . Alkaline Phosphatase 05/11/2013 79  39 -  117 U/L Final  . AST 05/11/2013 22  0 - 37 U/L Final  . ALT 05/11/2013 24  0 - 35 U/L Final  . Total Protein 05/11/2013 6.5  6.0 - 8.3 g/dL Final  . Albumin 16/08/9603 3.7  3.5 - 5.2 g/dL Final  . Calcium 54/07/8118 9.0  8.4 - 10.5 mg/dL Final  . GFR 14/78/2956 76.42  >60.00 mL/min Final  . Fructosamine 05/11/2013 278  <285 umol/L Final   Comment:                            Variations in levels of serum proteins (albumin and immunoglobulins)                          may affect fructosamine results.                             . Color, Urine 05/11/2013 LT.  YELLOW  Yellow;Lt. Yellow Final  . APPearance 05/11/2013 CLEAR  Clear Final  . Specific Gravity, Urine 05/11/2013 1.010  1.000-1.030 Final  . pH 05/11/2013 6.0  5.0 - 8.0 Final  . Total Protein, Urine 05/11/2013 NEGATIVE  Negative Final  . Urine Glucose 05/11/2013 NEGATIVE  Negative Final  . Ketones, ur 05/11/2013 NEGATIVE  Negative Final  . Bilirubin Urine 05/11/2013 NEGATIVE  Negative Final  . Hgb urine dipstick 05/11/2013 TRACE-LYSED  Negative Final  . Urobilinogen, UA 05/11/2013 0.2  0.0 - 1.0 Final  . Leukocytes, UA 05/11/2013 NEGATIVE  Negative Final  . Nitrite 05/11/2013 NEGATIVE  Negative Final  . Microalb, Ur 05/11/2013 0.1  0.0 - 1.9 mg/dL Final  . Creatinine,U 21/30/8657 50.9   Final  . Microalb Creat Ratio 05/11/2013 0.2  0.0 - 30.0 mg/g Final      Medication List       This list is accurate as of: 05/12/13 12:34 PM.  Always use your most recent med list.               aspirin 325 MG tablet  Take 1 tablet (325 mg total) by mouth daily. 30 minutes before niaspan     CALCIUM + D PO  Take 600 mg by mouth 2 (two) times daily.     ciprofloxacin 500 MG tablet  Commonly known as:  CIPRO  Take 1 tablet (500 mg total) by mouth 2 (two) times daily.     clopidogrel 75 MG tablet  Commonly known as:  PLAVIX  Take 1 tablet (75 mg total) by mouth daily.     diltiazem 120 MG 24 hr capsule  Commonly known as:  CARDIZEM CD  Take 1 capsule (120 mg total) by mouth daily.     doxycycline 100 MG capsule  Commonly known as:  VIBRAMYCIN  Take 100 mg by mouth 2 (two) times daily.     FINACEA PLUS 15 % Kit  Apply topically daily.     insulin pump 100 unit/ml Soln  Inject into the skin. Novolog Vial, max 200 units per day with pump     metoprolol succinate 100 MG 24 hr tablet  Commonly known as:  TOPROL-XL  Take 1.5 tablet by mouth daily     MIRVASO 0.33 % Gel  Generic drug:  Brimonidine Tartrate  Apply topically daily.     niacin 1000 MG CR tablet  Commonly known as:   NIASPAN  Take 1,000 mg  by mouth at bedtime.     nitroGLYCERIN 0.4 MG SL tablet  Commonly known as:  NITROSTAT  Place 1 tablet (0.4 mg total) under the tongue every 5 (five) minutes as needed.     pioglitazone 15 MG tablet  Commonly known as:  ACTOS  Take 15 mg by mouth daily.     rosuvastatin 40 MG tablet  Commonly known as:  CRESTOR  Take 40 mg by mouth daily.     VITAMIN D PO  Take 1,000 mg by mouth daily.        Allergies:  Allergies  Allergen Reactions  . Septra (Bactrim)   . Sulfa Antibiotics     Past Medical History  Diagnosis Date  . Coronary artery disease   . Hyperlipidemia   . Atrial flutter   . Osteopenia   . Arthritis   . Type 1 diabetes mellitus on insulin therapy   . Fibroid     Past Surgical History  Procedure Laterality Date  . Coronary angioplasty  12/18/2010    NORMAL LEFT VENTRICULAR SYSTOLIC FUNCTION  . Coronary angioplasty with stent placement    . Pelvic laparoscopy    . Myomectomy  1977  . Appendectomy      Family History  Problem Relation Age of Onset  . Heart disease Mother   . Diabetes Mother   . Hypertension Mother   . Heart disease Father   . Heart disease Brother     Social History:  reports that she has never smoked. She does not have any smokeless tobacco history on file. She reports that she drinks about 3.5 ounces of alcohol per week. She reports that she does not use illicit drugs.  REVIEW of systems:  Lab Results  Component Value Date   MICROALBUR 0.1 05/11/2013   She has a history of coronary disease but no recent Issues with chest pain  She has had diabetic dyslipidemia with high LDL particle number and this has been managed with Crestor and Niaspan Lab Results  Component Value Date   CHOL 130 04/08/2013   HDL 54.50 04/08/2013   LDLCALC 65 04/08/2013   TRIG 55.0 04/08/2013   CHOLHDL 2 04/08/2013    No significant hypertension and has had atrial arrhythmias treated with metoprolol and  diltiazem  ASSESSMENT:  Problems identified:   Excessive hypoglycemia overnight usually around 2-4 AM including a 34 reading last night with impaired mental function. Also sporadically may have low sugars later in the day but mostly after increased physical activity.  Periods of significantly high readings around 6-7 PM and occasionally midmorning, possibly after rebound and some high readings in evenings from snacking while cooking  Inadequate blood sugar monitoring during the day and evening  Inadequate exercise  Overall significant variability in blood sugar with a range of 34-332 and standard deviation 82  Plan: BASAL rate changes: Reduce basal rate overnight with midnight = 1.7, 2 AM = 1.4 and 11 PM = 2.0. Increase basal rate at 4 PM to 2.7 May need to adjust her basal rate around 6 PM based on blood sugars on the next visit More frequent  glucose monitoring especially during the afternoon and evenings especially after supper Discussed adjusting her basal rates for exercise and increased activities like housework and she can reduce the basal rate by 40% for about 2 hours since she tends to get relatively delayed hypoglycemia Consider adjusting mealtime boluses especially for her evening meal if blood sugars are higher after supper  Counseling time over  50% of today's 25 minute visit

## 2013-05-12 NOTE — Patient Instructions (Addendum)
Temp basal of -40 % for 2 hours when starting exercise Basal changes as on printout

## 2013-05-14 DIAGNOSIS — M659 Synovitis and tenosynovitis, unspecified: Secondary | ICD-10-CM | POA: Diagnosis not present

## 2013-05-24 ENCOUNTER — Telehealth: Payer: Self-pay | Admitting: *Deleted

## 2013-05-24 NOTE — Telephone Encounter (Signed)
I will write this on my prescription pad, she can come tomorrow or it can be mailed

## 2013-05-24 NOTE — Telephone Encounter (Signed)
[name] called to see you if you had written that letter of Medical Necessity for her insulin pump while she's on her [place] trip? cb # [phone]

## 2013-05-25 NOTE — Telephone Encounter (Signed)
Pt will pick it up on Wednesday

## 2013-07-12 ENCOUNTER — Other Ambulatory Visit (INDEPENDENT_AMBULATORY_CARE_PROVIDER_SITE_OTHER): Payer: Medicare Other

## 2013-07-12 DIAGNOSIS — E785 Hyperlipidemia, unspecified: Secondary | ICD-10-CM

## 2013-07-12 DIAGNOSIS — E1065 Type 1 diabetes mellitus with hyperglycemia: Secondary | ICD-10-CM

## 2013-07-12 DIAGNOSIS — I2510 Atherosclerotic heart disease of native coronary artery without angina pectoris: Secondary | ICD-10-CM | POA: Diagnosis not present

## 2013-07-12 LAB — COMPREHENSIVE METABOLIC PANEL
BUN: 11 mg/dL (ref 6–23)
CO2: 29 mEq/L (ref 19–32)
Creatinine, Ser: 0.7 mg/dL (ref 0.4–1.2)
GFR: 84.89 mL/min (ref >60.00)
Glucose, Bld: 175 mg/dL — ABNORMAL HIGH (ref 70–99)
Sodium: 141 mEq/L (ref 135–145)
Total Bilirubin: 0.6 mg/dL (ref 0.3–1.2)
Total Protein: 6.5 g/dL (ref 6.0–8.3)

## 2013-07-12 LAB — LIPID PANEL
HDL: 57.3 mg/dL (ref >39.00)
Total CHOL/HDL Ratio: 2
Triglycerides: 64 mg/dL (ref 0.0–149.0)

## 2013-07-13 LAB — NMR LIPOPROFILE WITHOUT LIPIDS
LDL Particle Number: 1087 nmol/L — ABNORMAL HIGH (ref <1000)
LP-IR Score: <25 (ref <=45)
Large VLDL-P: <0.8 nmol/L (ref <=2.7)
VLDL Size: 47.3 nm — ABNORMAL HIGH (ref <=46.6)

## 2013-07-16 ENCOUNTER — Ambulatory Visit (INDEPENDENT_AMBULATORY_CARE_PROVIDER_SITE_OTHER): Payer: Medicare Other | Admitting: Endocrinology

## 2013-07-16 ENCOUNTER — Encounter: Payer: Self-pay | Admitting: Endocrinology

## 2013-07-16 VITALS — BP 162/80 | HR 75 | Temp 98.2°F | Resp 12 | Ht 69.5 in | Wt 205.6 lb

## 2013-07-16 DIAGNOSIS — E1065 Type 1 diabetes mellitus with hyperglycemia: Secondary | ICD-10-CM | POA: Diagnosis not present

## 2013-07-16 DIAGNOSIS — E785 Hyperlipidemia, unspecified: Secondary | ICD-10-CM

## 2013-07-16 NOTE — Progress Notes (Addendum)
Patient ID: [name], female   DOB: [birth year], 67 y.o.   MRN: [id number]  HISTORY of present illness: Reason for visit 2 month followup.  Diagnosis: Type 1 diabetes mellitus, date of diagnosis: 1979.   Insulin Pump: CURRENT brand:  Ping The pump settings are Basal rate: Midnight = 1.7. 2 AM = 1.4; 5 a.m. = 1.9; 11 AM = 1.6. 5 p.m. = 2.7. 11 p.m. = 2.0 Carbohydrate ratio 1:9 until 5 PM and then 1:6; sensitivity 1:30, 25 after 5 p.m.; target 105. Insulin on board duration 4 hrs, maximum total daily dose 200 units     Monitors blood glucose: on an average 3.5 times a day according to pump download.  Glucometer: One Touch.  Blood Glucose readings: Early morning 62, 55, around 8-9 AM 75-234 with only 2 high readings; midday 76-150 Suppertime 61-395 with only a couple of high readings and after supper not checked recently, higher on the days when presupper reading high Overall average 145 21 % of readings above target and 14 % below  Hypoglycemic awareness: Has symptoms of feeling tired, headache, sweaty., She has symptoms when blood glucose is less than 60. Uses glucose tablets for treatment when not at home but otherwise eats a snack .  Meals: 3 meals per day.  Food preferences: Yogurt And cottage cheese for breakfast usually. Marland Kitchen  Physical activity: exercise: more  after exercise blood sugar will get lower in 1-2 hrs..  Certified Diabetes Educator visit: Most recent: 8/12.  Retinal exam: Most recent:, Unknown.  Diabetes Mellitus:  The patient is seen today in follow up of Type 1 diabetes   DIABETES: She has had long-standing diabetes with A1c levels usually above target. She had relatively high readings in 2013. In 10/13 ACTOS was restarted because of her large insulin requirement and diabetic dyslipidemia. Her blood sugars since then have improved significantly and basal rates were reduced.   RECENT history: She is not having as much hypoglycemia recently and overall blood sugars appear  to be better. Problems identified on the recent download are as follows:  Occasional hypoglycemia mostly in the afternoons which she thinks is when she is more active. However does not adjust her pump for increased activity  Overall continues to have variability in blood sugar but also has some days with fairly good readings  Periods of significantly high readings on some days and not clear of the reason, occasionally may be from rebound from high readings  and occasionally midmorning, possibly after rebound and some high readings in evenings from snacking while cooking  Inadequate blood sugar monitoring during the day and especially after supper  Inadequate exercise Not using the bolus wizard consistently at meal times  DIET has been relatively inconsistent overall and her mealtimes are variable. Frequently will not eat lunch. Usually trying to have low fat meals COMPLIANCE with boluses has been fairly good although rarely will still forget. She is again not entering carbohydrates in the pump for her meals and not clear if she is following the pump settings for carb ratios  The last HbgA1c was 6.9 in 5/14. Previously was 7.2 In 2/14,  Fructosamine  is 278 this time and indicates fairly good control  MICROALBUMIN has been tested, and the result is normal .  Appointment on 07/12/2013  Component Date Value Range Status  . Hemoglobin A1C 07/12/2013 7.8* 4.6 - 6.5 % Final   Glycemic Control Guidelines for People with Diabetes:Non Diabetic:  <6%Goal of Therapy: <7%Additional Action Suggested:  >8%   .  Sodium 07/12/2013 141  135 - 145 mEq/L Final  . Potassium 07/12/2013 4.9  3.5 - 5.1 mEq/L Final  . Chloride 07/12/2013 108  96 - 112 mEq/L Final  . CO2 07/12/2013 29  19 - 32 mEq/L Final  . Glucose, Bld 07/12/2013 175* 70 - 99 mg/dL Final  . BUN 16/08/9603 11  6 - 23 mg/dL Final  . Creatinine, Ser 07/12/2013 0.7  0.4 - 1.2 mg/dL Final  . Total Bilirubin 07/12/2013 0.6  0.3 - 1.2 mg/dL Final   . Alkaline Phosphatase 07/12/2013 91  39 - 117 U/L Final  . AST 07/12/2013 30  0 - 37 U/L Final  . ALT 07/12/2013 47* 0 - 35 U/L Final  . Total Protein 07/12/2013 6.5  6.0 - 8.3 g/dL Final  . Albumin 54/07/8118 3.6  3.5 - 5.2 g/dL Final  . Calcium 14/78/2956 8.9  8.4 - 10.5 mg/dL Final  . GFR 21/30/8657 84.89  >60.00 mL/min Final  . LDL Particle Number 07/12/2013 1087* <1000 nmol/L Final   Comment:                            Reference Range:                          ----------------                          Low:                <1000                          Moderate:           1000-1299                          Borderline-High:    1300-1599                          High:               1600-2000                          Very High:          >2000                                                        . HDL Particle Number 07/12/2013 36.4  >=30.5 umol/L Final  . Large HDL-P 07/12/2013 8.3  >=4.8 umol/L Final  . Large VLDL-P 07/12/2013 <0.8  <=2.7 nmol/L Final  . Small LDL Particle Number 07/12/2013 425  <=527 nmol/L Final  . LDL Size 07/12/2013 21.1  >84.6 nm Final  . HDL Size 07/12/2013 9.5  >=9.2 nm Final  . VLDL Size 07/12/2013 47.3* <=96.2 nm Final  . LP-IR Score 07/12/2013 <25  <=45 Final   Comment:                            HDL Particle Number, Large HDL-P, Large VLDL-P, Small LDL Particle  Number, LDL Size, HDL Size, VLDL Size, and LP-IR Score have been                          validated and are reported by [employer] but not cleared by                          the [place] FDA; the clinical utility of these test results has not been                          fully established.                             . Cholesterol 07/12/2013 140  0 - 200 mg/dL Final   ATP III Classification       Desirable:  < 200 mg/dL               Borderline High:  200 - 239 mg/dL          High:  > = 914 mg/dL  . Triglycerides 07/12/2013 64.0  0.0 - 149.0 mg/dL Final    Normal:  <782 mg/dLBorderline High:  150 - 199 mg/dL  . HDL 07/12/2013 57.30  >39.00 mg/dL Final  . VLDL 95/62/1308 12.8  0.0 - 40.0 mg/dL Final  . LDL Cholesterol 07/12/2013 70  0 - 99 mg/dL Final  . Total CHOL/HDL Ratio 07/12/2013 2   Final                  Men          Women1/2 Average Risk     3.4          3.3Average Risk          5.0          4.42X Average Risk          9.6          7.13X Average Risk          15.0          11.0                          Medication List       This list is accurate as of: 07/16/13  9:36 AM.  Always use your most recent med list.               aspirin 325 MG tablet  Take 1 tablet (325 mg total) by mouth daily. 30 minutes before niaspan     CALCIUM + D PO  Take 600 mg by mouth 2 (two) times daily.     ciprofloxacin 500 MG tablet  Commonly known as:  CIPRO  Take 1 tablet (500 mg total) by mouth 2 (two) times daily.     clopidogrel 75 MG tablet  Commonly known as:  PLAVIX  Take 1 tablet (75 mg total) by mouth daily.     diltiazem 120 MG 24 hr capsule  Commonly known as:  CARDIZEM CD  Take 1 capsule (120 mg total) by mouth daily.     doxycycline 100 MG capsule  Commonly known as:  VIBRAMYCIN  Take 100 mg by mouth 2 (two) times daily.     FINACEA PLUS 15 % Kit  Apply topically daily.     HUMALOG  100 UNIT/ML injection  Generic drug:  insulin lispro     insulin pump 100 unit/ml Soln  Inject into the skin. Novolog Vial, max 200 units per day with pump     meloxicam 15 MG tablet  Commonly known as:  MOBIC  15 mg.     metoprolol succinate 100 MG 24 hr tablet  Commonly known as:  TOPROL-XL  Take 1.5 tablet by mouth daily     MIRVASO 0.33 % Gel  Generic drug:  Brimonidine Tartrate  Apply topically daily.     niacin 1000 MG CR tablet  Commonly known as:  NIASPAN  Take 1 tablet (1,000 mg total) by mouth at bedtime.     nitroGLYCERIN 0.4 MG SL tablet  Commonly known as:  NITROSTAT  Place 1 tablet (0.4 mg total) under the tongue  every 5 (five) minutes as needed.     pioglitazone 15 MG tablet  Commonly known as:  ACTOS  Take 15 mg by mouth daily.     rosuvastatin 40 MG tablet  Commonly known as:  CRESTOR  Take 1 tablet (40 mg total) by mouth daily.     VITAMIN D PO  Take 1,000 mg by mouth daily.        Allergies:  Allergies  Allergen Reactions  . Septra [Bactrim]   . Sulfa Antibiotics     Past Medical History  Diagnosis Date  . Coronary artery disease   . Hyperlipidemia   . Atrial flutter   . Osteopenia   . Arthritis   . Type 1 diabetes mellitus on insulin therapy   . Fibroid     Past Surgical History  Procedure Laterality Date  . Coronary angioplasty  12/18/2010    NORMAL LEFT VENTRICULAR SYSTOLIC FUNCTION  . Coronary angioplasty with stent placement    . Pelvic laparoscopy    . Myomectomy  1977  . Appendectomy      Family History  Problem Relation Age of Onset  . Heart disease Mother   . Diabetes Mother   . Hypertension Mother   . Heart disease Father   . Heart disease Brother     Social History:  reports that she has never smoked. She does not have any smokeless tobacco history on file. She reports that she drinks about 3.5 ounces of alcohol per week. She reports that she does not use illicit drugs.  REVIEW of systems:  She has a history of coronary disease but no recent Issues with chest pain  She has had diabetic dyslipidemia with high LDL particle number and this has been managed with Crestor and Niaspan Lab Results  Component Value Date   CHOL 140 07/12/2013   HDL 57.30 07/12/2013   LDLCALC 70 07/12/2013   TRIG 64.0 07/12/2013   CHOLHDL 2 07/12/2013   No significant hypertension and has had atrial arrhythmias treated with metoprolol and diltiazem Has history of arthritis   ASSESSMENT:  DIABETES type 1 with fair control, A1c is relatively high at 7.8 However does not have any consistent glucose patterns at this time and not clear why her A1c is relatively high  Problems  identified:   Occasional hypoglycemia mostly in the afternoons which she thinks is when she is more active. However does not adjust her pump for increased activity  Overall continues to have variability in blood sugar but also has some days with fairly good readings  Periods of significantly high readings on some days and not clear of the reason, occasionally may be from  rebound from high readings  and occasionally midmorning, possibly after rebound and some high readings in evenings from snacking while cooking  Inadequate blood sugar monitoring during the day and especially after supper  Inadequate exercise  Not using the bolus wizard consistently at meal times  Plan:  Encouraged her to check her blood sugar more often and may use a different meter if that is more convenient. Needs to check readings about 2 hours after the evening meal to help adjust evening boluses and carbohydrate ratio Use temporary basal rates if planning to be exercising, most likely she will need to reduce basal rate by around 50% for a couple of hours after the exercise Call if having excessive hypoglycemia at consistent times  BASAL rate to be unchanged  HYPERLIPIDEMIA: Continue same regimen as her lipids are about the best they have been in a while with LDL 70, particle number near 1000 and particle size normal  Counseling time over 50% of today's 25 minute visit

## 2013-07-20 DIAGNOSIS — E785 Hyperlipidemia, unspecified: Secondary | ICD-10-CM | POA: Insufficient documentation

## 2013-07-20 NOTE — Patient Instructions (Signed)
Check blood sugars more consistently with every meal and some about 2 hours after supper. Call if blood sugars are consistently high or low

## 2013-08-04 DIAGNOSIS — L719 Rosacea, unspecified: Secondary | ICD-10-CM | POA: Diagnosis not present

## 2013-08-05 ENCOUNTER — Other Ambulatory Visit: Payer: Self-pay | Admitting: *Deleted

## 2013-08-05 DIAGNOSIS — Z23 Encounter for immunization: Secondary | ICD-10-CM | POA: Diagnosis not present

## 2013-08-05 DIAGNOSIS — I2510 Atherosclerotic heart disease of native coronary artery without angina pectoris: Secondary | ICD-10-CM

## 2013-08-05 MED ORDER — PIOGLITAZONE HCL 15 MG PO TABS: 15.0000 mg | ORAL_TABLET | Freq: Every day | ORAL | Status: DC

## 2013-08-05 MED ORDER — CLOPIDOGREL BISULFATE 75 MG PO TABS: 75.0000 mg | ORAL_TABLET | Freq: Every day | ORAL | Status: DC

## 2013-08-15 ENCOUNTER — Emergency Department (INDEPENDENT_AMBULATORY_CARE_PROVIDER_SITE_OTHER)
Admission: EM | Admit: 2013-08-15 | Discharge: 2013-08-15 | Disposition: A | Discharge status: Nursing Home (Includes ICF) | Payer: Medicare Other | Source: Home / Self Care | Attending: Family Medicine | Admitting: Family Medicine

## 2013-08-15 ENCOUNTER — Encounter (HOSPITAL_COMMUNITY): Payer: Self-pay | Admitting: Emergency Medicine

## 2013-08-15 ENCOUNTER — Observation Stay (HOSPITAL_COMMUNITY)
Admission: EM | Admit: 2013-08-15 | Discharge: 2013-08-16 | Disposition: A | Discharge status: Home / Self Care | Payer: Medicare Other | Attending: Cardiology | Admitting: Cardiology

## 2013-08-15 ENCOUNTER — Emergency Department (HOSPITAL_COMMUNITY): Payer: Medicare Other

## 2013-08-15 DIAGNOSIS — I059 Rheumatic mitral valve disease, unspecified: Secondary | ICD-10-CM | POA: Insufficient documentation

## 2013-08-15 DIAGNOSIS — M129 Arthropathy, unspecified: Secondary | ICD-10-CM | POA: Insufficient documentation

## 2013-08-15 DIAGNOSIS — Z79899 Other long term (current) drug therapy: Secondary | ICD-10-CM | POA: Insufficient documentation

## 2013-08-15 DIAGNOSIS — D259 Leiomyoma of uterus, unspecified: Secondary | ICD-10-CM | POA: Diagnosis not present

## 2013-08-15 DIAGNOSIS — Z9861 Coronary angioplasty status: Secondary | ICD-10-CM | POA: Insufficient documentation

## 2013-08-15 DIAGNOSIS — I2510 Atherosclerotic heart disease of native coronary artery without angina pectoris: Secondary | ICD-10-CM | POA: Diagnosis not present

## 2013-08-15 DIAGNOSIS — I4892 Unspecified atrial flutter: Secondary | ICD-10-CM | POA: Diagnosis not present

## 2013-08-15 DIAGNOSIS — E785 Hyperlipidemia, unspecified: Secondary | ICD-10-CM | POA: Insufficient documentation

## 2013-08-15 DIAGNOSIS — I4891 Unspecified atrial fibrillation: Secondary | ICD-10-CM | POA: Diagnosis not present

## 2013-08-15 DIAGNOSIS — E109 Type 1 diabetes mellitus without complications: Secondary | ICD-10-CM | POA: Insufficient documentation

## 2013-08-15 DIAGNOSIS — Z7902 Long term (current) use of antithrombotics/antiplatelets: Secondary | ICD-10-CM | POA: Insufficient documentation

## 2013-08-15 DIAGNOSIS — R0609 Other forms of dyspnea: Secondary | ICD-10-CM | POA: Diagnosis not present

## 2013-08-15 DIAGNOSIS — Z794 Long term (current) use of insulin: Secondary | ICD-10-CM | POA: Insufficient documentation

## 2013-08-15 DIAGNOSIS — I079 Rheumatic tricuspid valve disease, unspecified: Secondary | ICD-10-CM | POA: Diagnosis not present

## 2013-08-15 DIAGNOSIS — Z9641 Presence of insulin pump (external) (internal): Secondary | ICD-10-CM | POA: Diagnosis not present

## 2013-08-15 DIAGNOSIS — M899 Disorder of bone, unspecified: Secondary | ICD-10-CM | POA: Diagnosis not present

## 2013-08-15 DIAGNOSIS — R0989 Other specified symptoms and signs involving the circulatory and respiratory systems: Secondary | ICD-10-CM | POA: Insufficient documentation

## 2013-08-15 HISTORY — DX: Endocarditis, valve unspecified: I38

## 2013-08-15 LAB — BASIC METABOLIC PANEL
CO2: 24 mEq/L (ref 19–32)
Calcium: 9.6 mg/dL (ref 8.4–10.5)
GFR calc Af Amer: >90 mL/min (ref >90)
GFR calc non Af Amer: 87 mL/min — ABNORMAL LOW (ref >90)
Sodium: 138 mEq/L (ref 135–145)

## 2013-08-15 LAB — CBC WITH DIFFERENTIAL/PLATELET
Basophils Absolute: 0.1 10*3/uL (ref 0.0–0.1)
Eosinophils Absolute: 0.1 10*3/uL (ref 0.0–0.7)
Eosinophils Relative: 2 % (ref 0–5)
Lymphocytes Relative: 33 % (ref 12–46)
MCH: 32.2 pg (ref 26.0–34.0)
MCV: 89.7 fL (ref 78.0–100.0)
Neutro Abs: 3 10*3/uL (ref 1.7–7.7)
Platelets: 268 10*3/uL (ref 150–400)
RBC: 5.13 MIL/uL — ABNORMAL HIGH (ref 3.87–5.11)
RDW: 12.3 % (ref 11.5–15.5)

## 2013-08-15 LAB — PROTIME-INR
INR: 1.01 (ref 0.00–1.49)
Prothrombin Time: 13.1 seconds (ref 11.6–15.2)

## 2013-08-15 LAB — GLUCOSE, CAPILLARY
Glucose-Capillary: 101 mg/dL — ABNORMAL HIGH (ref 70–99)
Glucose-Capillary: 103 mg/dL — ABNORMAL HIGH (ref 70–99)

## 2013-08-15 LAB — TROPONIN I
Troponin I: <0.3 ng/mL (ref <0.30)
Troponin I: <0.3 ng/mL (ref <0.30)

## 2013-08-15 LAB — PRO B NATRIURETIC PEPTIDE: Pro B Natriuretic peptide (BNP): 1755 pg/mL — ABNORMAL HIGH (ref 0–125)

## 2013-08-15 LAB — APTT: aPTT: 25 s (ref 24–37)

## 2013-08-15 MED ORDER — CALCIUM CARBONATE-VITAMIN D 500-200 MG-UNIT PO TABS
2.0000 | ORAL_TABLET | Freq: Two times a day (BID) | ORAL | Status: DC
  Administered 2013-08-15 – 2013-08-16 (×2): 2 via ORAL
  Filled 2013-08-15 (×3): qty 2

## 2013-08-15 MED ORDER — FINACEA PLUS 15 % EX KIT: 1.0000 "application " | PACK | Freq: Every day | CUTANEOUS | Status: DC

## 2013-08-15 MED ORDER — DILTIAZEM HCL 100 MG IV SOLR
5.0000 mg/h | INTRAVENOUS | Status: DC
  Administered 2013-08-15 – 2013-08-16 (×2): 5 mg/h via INTRAVENOUS
  Filled 2013-08-15 (×2): qty 100

## 2013-08-15 MED ORDER — RIVAROXABAN 20 MG PO TABS
20.0000 mg | ORAL_TABLET | Freq: Every day | ORAL | Status: DC
  Administered 2013-08-15: 20 mg via ORAL
  Filled 2013-08-15 (×2): qty 1

## 2013-08-15 MED ORDER — RIVAROXABAN 20 MG PO TABS: 20.0000 mg | ORAL_TABLET | Freq: Every day | ORAL | Status: DC

## 2013-08-15 MED ORDER — INSULIN PUMP
Freq: Three times a day (TID) | SUBCUTANEOUS | Status: DC
  Administered 2013-08-15: 5 via SUBCUTANEOUS
  Filled 2013-08-15: qty 1

## 2013-08-15 MED ORDER — METOPROLOL TARTRATE 25 MG PO TABS
25.0000 mg | ORAL_TABLET | Freq: Three times a day (TID) | ORAL | Status: DC
  Administered 2013-08-15 – 2013-08-16 (×2): 25 mg via ORAL
  Filled 2013-08-15 (×5): qty 1

## 2013-08-15 MED ORDER — CALCIUM CITRATE-VITAMIN D 315-200 MG-UNIT PO TABS: 2.0000 | ORAL_TABLET | Freq: Two times a day (BID) | ORAL | Status: DC

## 2013-08-15 MED ORDER — RIVAROXABAN 20 MG PO TABS
20.0000 mg | ORAL_TABLET | Freq: Once | ORAL | Status: AC
  Administered 2013-08-16: 20 mg via ORAL
  Filled 2013-08-15 (×2): qty 1

## 2013-08-15 MED ORDER — RIVAROXABAN 20 MG PO TABS: 20.0000 mg | ORAL_TABLET | Freq: Once | ORAL | Status: DC

## 2013-08-15 MED ORDER — DILTIAZEM HCL 100 MG IV SOLR
5.0000 mg/h | Freq: Once | INTRAVENOUS | Status: AC
  Administered 2013-08-15: 5 mg/h via INTRAVENOUS

## 2013-08-15 MED ORDER — ALPRAZOLAM 0.25 MG PO TABS: 0.2500 mg | ORAL_TABLET | Freq: Two times a day (BID) | ORAL | Status: DC | PRN

## 2013-08-15 MED ORDER — NITROGLYCERIN 0.4 MG SL SUBL: 0.4000 mg | SUBLINGUAL_TABLET | SUBLINGUAL | Status: DC | PRN

## 2013-08-15 MED ORDER — INSULIN PUMP
Freq: Three times a day (TID) | SUBCUTANEOUS | Status: DC
  Administered 2013-08-16: 1 via SUBCUTANEOUS
  Filled 2013-08-15: qty 1

## 2013-08-15 MED ORDER — ONDANSETRON HCL 4 MG/2ML IJ SOLN: 4.0000 mg | Freq: Four times a day (QID) | INTRAMUSCULAR | Status: DC | PRN

## 2013-08-15 MED ORDER — NIACIN ER 500 MG PO CPCR
1000.0000 mg | ORAL_CAPSULE | Freq: Every day | ORAL | Status: DC
  Administered 2013-08-15: 1000 mg via ORAL
  Filled 2013-08-15 (×2): qty 2

## 2013-08-15 MED ORDER — PIOGLITAZONE HCL 15 MG PO TABS
15.0000 mg | ORAL_TABLET | Freq: Every day | ORAL | Status: DC
  Administered 2013-08-16: 15 mg via ORAL
  Filled 2013-08-15: qty 1

## 2013-08-15 MED ORDER — ZOLPIDEM TARTRATE 5 MG PO TABS: 5.0000 mg | ORAL_TABLET | Freq: Every evening | ORAL | Status: DC | PRN

## 2013-08-15 MED ORDER — VITAMIN D3 25 MCG (1000 UNIT) PO TABS
1000.0000 [IU] | ORAL_TABLET | Freq: Every day | ORAL | Status: DC
  Administered 2013-08-16: 1000 [IU] via ORAL
  Filled 2013-08-15 (×2): qty 1

## 2013-08-15 MED ORDER — BRIMONIDINE TARTRATE 0.33 % EX GEL: 1.0000 "application " | Freq: Every day | CUTANEOUS | Status: DC

## 2013-08-15 MED ORDER — ATORVASTATIN CALCIUM 80 MG PO TABS
80.0000 mg | ORAL_TABLET | Freq: Every day | ORAL | Status: DC
  Filled 2013-08-15 (×2): qty 1

## 2013-08-15 MED ORDER — NIACIN ER (ANTIHYPERLIPIDEMIC) 1000 MG PO TBCR: 1000.0000 mg | EXTENDED_RELEASE_TABLET | Freq: Every day | ORAL | Status: DC

## 2013-08-15 MED ORDER — ACETAMINOPHEN 325 MG PO TABS: 650.0000 mg | ORAL_TABLET | ORAL | Status: DC | PRN

## 2013-08-15 NOTE — ED Notes (Signed)
Called lab stated will add APTT test with available blood.

## 2013-08-15 NOTE — H&P (Addendum)
CARDIOLOGY ADMISSION NOTE  Patient ID: [name] MRN: [id number] DOB/AGE: July 23, 1946 67 y.o.  Admit date: 08/15/2013 Primary Physician   Rene Paci, MD Primary Cardiologist   Dr. Elease Hashimoto Chief Complaint    Rapid heart rate  HPI: The patient presents for evaluation of rapid heart rate.  She has a history of atrial fib/flutter and CAD.   She is s/p stent to the LAD in 2009.  Nuclear study in was low risk with questionable small distal anterior infarct in 2012.  While she is reported to have had flutter and fib in the past, I do not see flutter but I see documentation of fibrillation.  However, she is clear that she has been told that she had flutter as well in the past.  She presented to the ER today with rapid heart rate lasting probably greater than two days.  Initially the HR was 150.  EKG was atrial flutter with 2:1 conduction predominant.  She did slow to ventricular rates in the 50s with IV Cardizem which has to be stopped.  She reports that she was feeling somewhat tired. She got a little presyncopal. She didn't describe any chest pressure, neck or arm discomfort. She did notice that her heart was rapid. Finally presented to urgent care and referred to the emergency room. She's had no new SOB/PND or orthopnea.     Past Medical History  Diagnosis Date  . Coronary artery disease     2009 LAD Promus stent  . Hyperlipidemia   . Atrial flutter   . Osteopenia   . Arthritis   . Type 1 diabetes mellitus on insulin therapy   . Fibroid     Past Surgical History  Procedure Laterality Date  . Coronary angioplasty  12/18/2010    NORMAL LEFT VENTRICULAR SYSTOLIC FUNCTION  . Coronary angioplasty with stent placement    . Pelvic laparoscopy    . Myomectomy  1977  . Appendectomy      Allergies  Allergen Reactions  . Septra [Bactrim] Rash  . Sulfa Antibiotics Rash   No current facility-administered medications on file prior to encounter.   Current Outpatient Prescriptions on  File Prior to Encounter  Medication Sig Dispense Refill  . aspirin 325 MG tablet Take 1 tablet (325 mg total) by mouth daily. 30 minutes before niaspan      . Azelaic Acid-Cleanser-Lotion (FINACEA PLUS) 15 % KIT Apply 1 application topically at bedtime.       . Brimonidine Tartrate (MIRVASO) 0.33 % GEL Apply 1 application topically daily.       . Calcium Carbonate-Vitamin D (CALCIUM + D PO) Take 600 mg by mouth 2 (two) times daily.       . Cholecalciferol (VITAMIN D PO) Take 1,000 mg by mouth daily.       . clopidogrel (PLAVIX) 75 MG tablet Take 1 tablet (75 mg total) by mouth daily.  30 tablet  0  . diltiazem (CARDIZEM CD) 120 MG 24 hr capsule Take 1 capsule (120 mg total) by mouth daily.  90 capsule  3  . metoprolol succinate (TOPROL-XL) 100 MG 24 hr tablet Take 1.5 tablet by mouth daily  135 tablet  3  . niacin (NIASPAN) 1000 MG CR tablet Take 1 tablet (1,000 mg total) by mouth at bedtime.  90 tablet  3  . pioglitazone (ACTOS) 15 MG tablet Take 1 tablet (15 mg total) by mouth daily.  90 tablet  3  . rosuvastatin (CRESTOR) 40 MG tablet Take 1 tablet (  40 mg total) by mouth daily.  90 tablet  3  . [CANCELED] ciprofloxacin (CIPRO) 500 MG tablet Take 1 tablet (500 mg total) by mouth 2 (two) times daily.  10 tablet  0  . Insulin Aspart (INSULIN PUMP) 100 unit/ml SOLN Inject into the skin. Novolog Vial, max 200 units per day with pump      . nitroGLYCERIN (NITROSTAT) 0.4 MG SL tablet Place 1 tablet (0.4 mg total) under the tongue every 5 (five) minutes as needed.  25 tablet  11   History   Social History  . Marital Status: Married    Spouse Name: N/A    Number of Children: N/A  . Years of Education: N/A   Occupational History  . Not on file.   Social History Main Topics  . Smoking status: Never Smoker   . Smokeless tobacco: Not on file  . Alcohol Use: 1.2 oz/week    2 Glasses of wine per week  . Drug Use: No  . Sexual Activity: Yes    Birth Control/ Protection: Post-menopausal    Other Topics Concern  . Not on file   Social History Narrative  . No narrative on file    Family History  Problem Relation Age of Onset  . Heart disease Mother   . Diabetes Mother   . Hypertension Mother   . Heart disease Father   . Heart disease Brother     ROS:  As stated in the HPI and negative for all other systems.  Physical Exam: Blood pressure 99/47, pulse 42, temperature 97.8 F (36.6 C), temperature source Oral, resp. rate 14, height 5\' 9"  (1.753 m), weight 200 lb (90.719 kg), SpO2 97.00%.  GENERAL:  Well appearing HEENT:  Pupils equal round and reactive, fundi not visualized, oral mucosa unremarkable NECK:  No jugular venous distention, waveform within normal limits, carotid upstroke brisk and symmetric, no bruits, no thyromegaly LYMPHATICS:  No cervical, inguinal adenopathy LUNGS:  Bilateral crackles at the bases BACK:  No CVA tenderness CHEST:  Unremarkable HEART:  PMI not displaced or sustained,S1 and S2 within normal limits, no S3, no clicks, no rubs, no murmurs ABD:  Flat, positive bowel sounds normal in frequency in pitch, no bruits, no rebound, no guarding, no midline pulsatile mass, no hepatomegaly, no splenomegaly EXT:  2 plus pulses throughout, no edema, no cyanosis no clubbing SKIN:  No rashes no nodules NEURO:  Cranial nerves II through XII grossly intact, motor grossly intact throughout PSYCH:  Cognitively intact, oriented to person place and time  Labs: Lab Results  Component Value Date   BUN 15 08/15/2013   Lab Results  Component Value Date   CREATININE 0.74 08/15/2013   Lab Results  Component Value Date   NA 138 08/15/2013   K 3.8 08/15/2013   CL 102 08/15/2013   CO2 24 08/15/2013   Lab Results  Component Value Date   TROPONINI <0.30 08/15/2013   Lab Results  Component Value Date   WBC 5.5 08/15/2013   HGB 16.5* 08/15/2013   HCT 46.0 08/15/2013   MCV 89.7 08/15/2013   PLT 268 08/15/2013    Radiology:  CXR:  1. Borderline  cardiomegaly with mild cephalization of the pulmonary  vasculature, but no frank pulmonary edema at this time.  EKG:  Atrial flutter with variable conduction.  Rate 137, no acute ST T wave changes.  08/15/2013  ASSESSMENT AND PLAN:    Atrial flutter:  The patient will be  for rate control and anticoagulation.  I am actually going to use Xarelto.  She will need a TEE if she remains in flutter. Ultimately I would suggest EP consultation for possible ablation.  It does appears that flutter has been the predominant rhythm.    CAD:  No active ischemic symptoms.  Of note, she does have some crackles on exam and mild edema on CXR. I will start with a BNP.  Might need a TTE in the AM to assess the LV before cardioversion. The BNP is to evaluate pulmonary edema and dyspnea.   SignedRollene Rotunda 08/15/2013, 3:55 PM

## 2013-08-15 NOTE — ED Provider Notes (Signed)
CSN: [id number]     Arrival date & time 08/15/13  1227 History   First MD Initiated Contact with Patient 08/15/13 1240     Chief Complaint  Patient presents with  . Atrial Fibrillation  . Tachycardia   (Consider location/radiation/quality/duration/timing/severity/associated sxs/prior Treatment) HPI Comments: Issue with prior history of atrial flutter in the past. She is on both diltiazem and a beta blocker. In the past she has been on digoxin as well as Coumadin. She is not currently anticoagulated. She reports increasing fatigue over the last 2 and half days. She has not had any discrete palpitations, chest pain, shortness of breath where she can tell for certain. that she's been in atrial flutter. She did check her pulse last night and noted it to be fast. She waited to see if symptoms would improve on her own but did not and therefore chose to came to the emergency department this morning. She denies fever, cough. No recent vomiting or diarrhea. She reports no recent change in medications by her primary care doctor or cardiologist, Dr. Elease Hashimoto.    Patient is a 67 y.o. female presenting with atrial fibrillation. The history is provided by the patient.  Atrial Fibrillation This is a recurrent problem. The current episode started more than 2 days ago. The problem occurs constantly. The problem has not changed since onset.Pertinent negatives include no chest pain, no abdominal pain and no shortness of breath. Associated symptoms comments: Generalized fatigue.    Past Medical History  Diagnosis Date  . Coronary artery disease     2009 LAD Promus stent  . Hyperlipidemia   . Atrial flutter   . Osteopenia   . Arthritis   . Type 1 diabetes mellitus on insulin therapy   . Fibroid    Past Surgical History  Procedure Laterality Date  . Coronary angioplasty  12/18/2010    NORMAL LEFT VENTRICULAR SYSTOLIC FUNCTION  . Coronary angioplasty with stent placement    . Pelvic laparoscopy    .  Myomectomy  1977  . Appendectomy     Family History  Problem Relation Age of Onset  . Heart disease Mother   . Diabetes Mother   . Hypertension Mother   . Heart disease Father   . Heart disease Brother    History  Substance Use Topics  . Smoking status: Never Smoker   . Smokeless tobacco: Not on file  . Alcohol Use: 1.2 oz/week    2 Glasses of wine per week   OB History   Grav Para Term Preterm Abortions TAB SAB Ect Mult Living   2    2  2    0     Review of Systems  Constitutional: Positive for fatigue. Negative for fever and chills.  Respiratory: Negative for shortness of breath.   Cardiovascular: Negative for chest pain.  Gastrointestinal: Negative for nausea, vomiting and abdominal pain.  All other systems reviewed and are negative.    Allergies  Septra and Sulfa antibiotics  Home Medications   Current Outpatient Rx  Name  Route  Sig  Dispense  Refill  . aspirin 325 MG tablet   Oral   Take 1 tablet (325 mg total) by mouth daily. 30 minutes before niaspan         . Azelaic Acid-Cleanser-Lotion (FINACEA PLUS) 15 % KIT   Apply externally   Apply 1 application topically at bedtime.          . Brimonidine Tartrate (MIRVASO) 0.33 % GEL   Apply externally  Apply 1 application topically daily.          . Calcium Carbonate-Vitamin D (CALCIUM + D PO)   Oral   Take 600 mg by mouth 2 (two) times daily.          . Cholecalciferol (VITAMIN D PO)   Oral   Take 1,000 mg by mouth daily.          . clopidogrel (PLAVIX) 75 MG tablet   Oral   Take 1 tablet (75 mg total) by mouth daily.   30 tablet   0   . diltiazem (CARDIZEM CD) 120 MG 24 hr capsule   Oral   Take 1 capsule (120 mg total) by mouth daily.   90 capsule   3     Pt name [name] DOB 1946-02-12 MEMBER Number H ...   . metoprolol succinate (TOPROL-XL) 100 MG 24 hr tablet      Take 1.5 tablet by mouth daily   135 tablet   3     Name [name] DOB [birth year] [facility] # H [id number]  ...   . niacin (NIASPAN) 1000 MG CR tablet   Oral   Take 1 tablet (1,000 mg total) by mouth at bedtime.   90 tablet   3   . pioglitazone (ACTOS) 15 MG tablet   Oral   Take 1 tablet (15 mg total) by mouth daily.   90 tablet   3   . rosuvastatin (CRESTOR) 40 MG tablet   Oral   Take 1 tablet (40 mg total) by mouth daily.   90 tablet   3   . DISCONTD: ciprofloxacin (CIPRO) 500 MG tablet   Oral   Take 1 tablet (500 mg total) by mouth 2 (two) times daily.   10 tablet   0   . Insulin Aspart (INSULIN PUMP) 100 unit/ml SOLN   Subcutaneous   Inject into the skin. Novolog Vial, max 200 units per day with pump         . nitroGLYCERIN (NITROSTAT) 0.4 MG SL tablet   Sublingual   Place 1 tablet (0.4 mg total) under the tongue every 5 (five) minutes as needed.   25 tablet   11    BP 98/58  Pulse 70  Temp(Src) 97.8 F (36.6 C) (Oral)  Resp 14  Ht 5\' 9"  (1.753 m)  Wt 200 lb (90.719 kg)  BMI 29.52 kg/m2  SpO2 97% Physical Exam  Nursing note and vitals reviewed. Constitutional: She is oriented to person, place, and time. She appears well-developed and well-nourished.  HENT:  Head: Normocephalic and atraumatic.  Eyes: Conjunctivae and EOM are normal. No scleral icterus.  Neck: Normal range of motion. Neck supple.  Cardiovascular: Intact distal pulses.  An irregularly irregular rhythm present. Tachycardia present.   No murmur heard. Pulmonary/Chest: Effort normal. No respiratory distress. She has no wheezes. She has no rales.  Abdominal: Soft. She exhibits no distension. There is no tenderness. There is no rebound and no guarding.  Neurological: She is alert and oriented to person, place, and time. She exhibits normal muscle tone. Coordination normal.  Skin: Skin is warm and dry. No rash noted.  Psychiatric: She has a normal mood and affect.    ED Course  Procedures (including critical care time)  CRITICAL CARE Performed by: Lear Ng. Total critical care time: 30  min Critical care time was exclusive of separately billable procedures and treating other patients. Critical care was necessary to treat or prevent imminent or life-threatening  deterioration. Critical care was time spent personally by me on the following activities: development of treatment plan with patient and/or surrogate as well as nursing, discussions with consultants, evaluation of patient's response to treatment, examination of patient, obtaining history from patient or surrogate, ordering and performing treatments and interventions, ordering and review of laboratory studies, ordering and review of radiographic studies, pulse oximetry and re-evaluation of patient's condition.   Labs Review Labs Reviewed  CBC WITH DIFFERENTIAL - Abnormal; Notable for the following:    RBC 5.13 (*)    Hemoglobin 16.5 (*)    All other components within normal limits  BASIC METABOLIC PANEL - Abnormal; Notable for the following:    Glucose, Bld 161 (*)    GFR calc non Af Amer 87 (*)    All other components within normal limits  TROPONIN I  PROTIME-INR  APTT   Imaging Review Dg Chest Portable 1 View  08/15/2013   CLINICAL DATA:  Atrial fibrillation. Tachycardia.  EXAM: PORTABLE CHEST - 1 VIEW  COMPARISON:  CHEST x-ray 04/09/2012.  FINDINGS: Lung volumes are normal. No acute consolidative airspace disease. No pleural effusions. Mild cephalization of the pulmonary vasculature, without frank pulmonary edema. Borderline cardiomegaly. Upper mediastinal contours are within normal limits.  IMPRESSION: 1. Borderline cardiomegaly with mild cephalization of the pulmonary vasculature, but no frank pulmonary edema at this time.   Electronically Signed   By: Trudie Reed M.D.   On: 08/15/2013 14:12    EKG Interpretation     Ventricular Rate:  137 PR Interval:    QRS Duration: 192 QT Interval:  387 QTC Calculation: 589 R Axis:   -80 Text Interpretation:  Atrial flutter with 2:1 AV block Nonspecific IVCD  with LAD Left ventricular hypertrophy Borderline abnrm T, anterolateral leads           Oxygen saturation is 98% on nasal cannula, interpreted be adequate   3:30 PM Diltiazem drip had only been up to 5 mg per hour, the patient's blood pressure had get down to 100/70, heart rate had slowed down to the 40s although still in atrial flutter. The diltiazem and has been discontinued for now and cardiology should be seeing pt shortly.    MDM   1. Atrial flutter, paroxysmal     Patient with prior history of paroxysmal atrial flutter, is stable, with no symptoms of chest pain, shortness of breath. She is hemodynamically normal with a blood pressure of 130/80, however heart rate is rapid at 135-140. Plan is to start her on IV diltiazem for rate control and plan is to consult with cardiology given her symptoms likely had been in place for at least 2 days, bordering on 72 hours. Whether or not urgent cardioversion in the emergency department versus inpatient admission for observation, echocardiogram is warranted can be decided in consultation with cardiology. I spoke with Dr. Wyline Mood with the [facility] cardiology plans with them seeing the patient in the emergency department. For now he agrees with diltiazem drip and likely admission to the hospital.    Gavin Pound. Alexes Lamarque, MD 08/15/13 1610

## 2013-08-15 NOTE — ED Notes (Signed)
MD at bedside. 

## 2013-08-15 NOTE — ED Notes (Signed)
Placed  On  Cardiac  Monitor nasal o2  At  2  l   /  Min           Iv  Ns  tko  20  Angio  l  Hand  1  Att  Care link  Called

## 2013-08-15 NOTE — ED Notes (Signed)
EKG completed @ 1232

## 2013-08-15 NOTE — ED Notes (Signed)
Cardiology stated to continued to leave Cardizem off and Cardiology will order heparin.

## 2013-08-15 NOTE — ED Notes (Signed)
Cardiology at bedside.

## 2013-08-15 NOTE — ED Notes (Signed)
Pt presents via carelink from urgent care where she sought treatment for "fast heart rate." she has a history of atrial flutter, EMS reported HR at around 150 but the patient is asymptomatic but reports feelings palpitations occasionally. Denies chest pain, sob, dizziness

## 2013-08-15 NOTE — ED Notes (Signed)
Pt presents via carelink from [facility] urgent care center after c/o fast HR.  EMS reports that ECG performed at urgent care showed afib with hr at 156.  Patient denies dizziness, chestpain, and sob.[name]

## 2013-08-15 NOTE — ED Provider Notes (Signed)
CSN: [id number]     Arrival date & time 08/15/13  1118 History   First MD Initiated Contact with Patient 08/15/13 1127     Chief Complaint  Patient presents with  . Irregular Heart Beat   (Consider location/radiation/quality/duration/timing/severity/associated sxs/prior Treatment) Patient is a 67 y.o. female presenting with palpitations. The history is provided by the patient.  Palpitations Palpitations quality:  Irregular Onset quality:  Sudden Duration:  1 week Progression:  Worsening Chronicity:  Recurrent Associated symptoms: malaise/fatigue and weakness   Associated symptoms: no chest pain, no chest pressure, no cough, no lower extremity edema and no shortness of breath   Risk factors: diabetes mellitus and hx of atrial fibrillation     Past Medical History  Diagnosis Date  . Coronary artery disease   . Hyperlipidemia   . Atrial flutter   . Osteopenia   . Arthritis   . Type 1 diabetes mellitus on insulin therapy   . Fibroid    Past Surgical History  Procedure Laterality Date  . Coronary angioplasty  12/18/2010    NORMAL LEFT VENTRICULAR SYSTOLIC FUNCTION  . Coronary angioplasty with stent placement    . Pelvic laparoscopy    . Myomectomy  1977  . Appendectomy     Family History  Problem Relation Age of Onset  . Heart disease Mother   . Diabetes Mother   . Hypertension Mother   . Heart disease Father   . Heart disease Brother    History  Substance Use Topics  . Smoking status: Never Smoker   . Smokeless tobacco: Not on file  . Alcohol Use: 3.5 oz/week    7 drink(s) per week   OB History   Grav Para Term Preterm Abortions TAB SAB Ect Mult Living   2    2  2    0     Review of Systems  Constitutional: Positive for malaise/fatigue and fatigue.  Respiratory: Negative for cough, chest tightness, shortness of breath and wheezing.   Cardiovascular: Positive for palpitations. Negative for chest pain and leg swelling.    Allergies  Septra and Sulfa  antibiotics  Home Medications   Current Outpatient Rx  Name  Route  Sig  Dispense  Refill  . EXPIRED: aspirin 325 MG tablet   Oral   Take 1 tablet (325 mg total) by mouth daily. 30 minutes before niaspan         . Azelaic Acid-Cleanser-Lotion (FINACEA PLUS) 15 % KIT   Apply externally   Apply topically daily.         . Brimonidine Tartrate (MIRVASO) 0.33 % GEL   Apply externally   Apply topically daily.         . Calcium Carbonate-Vitamin D (CALCIUM + D PO)   Oral   Take 600 mg by mouth 2 (two) times daily.          . Cholecalciferol (VITAMIN D PO)   Oral   Take 1,000 mg by mouth daily.          Marland Kitchen DISCONTD: ciprofloxacin (CIPRO) 500 MG tablet   Oral   Take 1 tablet (500 mg total) by mouth 2 (two) times daily.   10 tablet   0   . clopidogrel (PLAVIX) 75 MG tablet   Oral   Take 1 tablet (75 mg total) by mouth daily.   30 tablet   0   . clopidogrel (PLAVIX) 75 MG tablet   Oral   Take 1 tablet (75 mg total) by mouth daily.  90 tablet   3   . diltiazem (CARDIZEM CD) 120 MG 24 hr capsule   Oral   Take 1 capsule (120 mg total) by mouth daily.   90 capsule   3     Pt name [name] DOB 04/13/1946 MEMBER Number H ...   . doxycycline (VIBRAMYCIN) 100 MG capsule   Oral   Take 100 mg by mouth 2 (two) times daily.         Marland Kitchen HUMALOG 100 UNIT/ML injection               . Insulin Aspart (INSULIN PUMP) 100 unit/ml SOLN   Subcutaneous   Inject into the skin. Novolog Vial, max 200 units per day with pump         . meloxicam (MOBIC) 15 MG tablet      15 mg.         . metoprolol succinate (TOPROL-XL) 100 MG 24 hr tablet      Take 1.5 tablet by mouth daily   135 tablet   3     Name [name] DOB Jun 17, 1946 [facility] # H [id number] ...   . niacin (NIASPAN) 1000 MG CR tablet   Oral   Take 1 tablet (1,000 mg total) by mouth at bedtime.   90 tablet   3   . nitroGLYCERIN (NITROSTAT) 0.4 MG SL tablet   Sublingual   Place 1 tablet (0.4 mg total)  under the tongue every 5 (five) minutes as needed.   25 tablet   11   . pioglitazone (ACTOS) 15 MG tablet   Oral   Take 1 tablet (15 mg total) by mouth daily.   90 tablet   3   . rosuvastatin (CRESTOR) 40 MG tablet   Oral   Take 1 tablet (40 mg total) by mouth daily.   90 tablet   3    BP 141/76  Pulse 92  Temp(Src) 96.8 F (36 C) (Oral)  Resp 20  SpO2 100% Physical Exam  Nursing note and vitals reviewed. Constitutional: She is oriented to person, place, and time. She appears well-developed and well-nourished.  HENT:  Head: Normocephalic.  Neck: Normal range of motion. Neck supple.  Cardiovascular: Normal heart sounds, intact distal pulses and normal pulses.  An irregularly irregular rhythm present.  Pulmonary/Chest: Effort normal and breath sounds normal.  Abdominal: Soft. Bowel sounds are normal.  Musculoskeletal: She exhibits no edema.  Lymphadenopathy:    She has no cervical adenopathy.  Neurological: She is alert and oriented to person, place, and time.  Skin: Skin is warm and dry.    ED Course  Procedures (including critical care time) Labs Review Labs Reviewed - No data to display Imaging Review No results found.  EKG Interpretation     Ventricular Rate:    PR Interval:    QRS Duration:   QT Interval:    QTC Calculation:   R Axis:     Text Interpretation:              MDM  Sent for rate control of a fib with rvr.Linna Hoff, MD 08/15/13 [phone]

## 2013-08-15 NOTE — ED Notes (Signed)
Pt placed on cardiac monitor (atrial flutter, appx 160 bpm) and O2 by [place] @ 2 Lpm

## 2013-08-15 NOTE — ED Notes (Signed)
Pt  Reports feels  Like  Her heart  Is  Beating too  Fast      denys  Any chest pain       She  Reports    Has  Been  Under  Lots  Of  Stress  Lately          She  Reports  She  Has  Been taking her meds   As  Directed  And  Has  Not  Missed  Any  Doses        She  Ambulated  To  Room  And  Is in no  Acute  Distress

## 2013-08-15 NOTE — ED Notes (Signed)
Patient ambulated to bathroom and returned. Stated thought her blood sugar was low. CBG 101.  Given patient a cup of orange juice. States currently not hungry at this time. Left [place] sandwich and apple sauce at bedside.

## 2013-08-15 NOTE — Progress Notes (Signed)
Inpatient Diabetes Program Recommendations  AACE/ADA: New Consensus Statement on Inpatient Glycemic Control (2013)  Target Ranges:  Prepandial:   less than 140 mg/dL      Peak postprandial:   less than 180 mg/dL (1-2 hours)      Critically ill patients:  140 - 180 mg/dL   RN Ander Purpura called Diabetes Coordinator to alert patient has insulin pump. Wrong order entered so RN will change to correct order-set (Insulin Pump Order Set). Patient was able to give RN and this coordinator her basal insulin rates: 12 am-1.7, 2am- 1.4, 5am-1.9, 11am-1.6, 4pm-2.7, 11pm-2 units per hour. Will follow up with patient tomorrow. Thank you  Piedad Climes BSN, RN,CDE Inpatient Diabetes Coordinator [phone] (team pager)

## 2013-08-16 ENCOUNTER — Encounter (HOSPITAL_COMMUNITY)
Admission: EM | Disposition: A | Discharge status: Home / Self Care | Payer: Self-pay | Source: Home / Self Care | Attending: Emergency Medicine

## 2013-08-16 ENCOUNTER — Encounter (HOSPITAL_COMMUNITY): Payer: Self-pay | Admitting: Cardiology

## 2013-08-16 ENCOUNTER — Observation Stay (HOSPITAL_COMMUNITY): Payer: Medicare Other | Admitting: Certified Registered Nurse Anesthetist

## 2013-08-16 ENCOUNTER — Encounter (HOSPITAL_COMMUNITY): Payer: Medicare Other | Admitting: Certified Registered Nurse Anesthetist

## 2013-08-16 DIAGNOSIS — D259 Leiomyoma of uterus, unspecified: Secondary | ICD-10-CM | POA: Diagnosis not present

## 2013-08-16 DIAGNOSIS — I2510 Atherosclerotic heart disease of native coronary artery without angina pectoris: Secondary | ICD-10-CM | POA: Diagnosis not present

## 2013-08-16 DIAGNOSIS — I4891 Unspecified atrial fibrillation: Secondary | ICD-10-CM | POA: Diagnosis not present

## 2013-08-16 DIAGNOSIS — I4892 Unspecified atrial flutter: Secondary | ICD-10-CM

## 2013-08-16 DIAGNOSIS — I079 Rheumatic tricuspid valve disease, unspecified: Secondary | ICD-10-CM | POA: Diagnosis not present

## 2013-08-16 DIAGNOSIS — I059 Rheumatic mitral valve disease, unspecified: Secondary | ICD-10-CM | POA: Diagnosis not present

## 2013-08-16 HISTORY — PX: CARDIOVERSION: SHX1299

## 2013-08-16 HISTORY — PX: TEE WITHOUT CARDIOVERSION: SHX5443

## 2013-08-16 LAB — COMPREHENSIVE METABOLIC PANEL
ALT: 30 U/L (ref 0–35)
AST: 22 U/L (ref 0–37)
Albumin: 3.4 g/dL — ABNORMAL LOW (ref 3.5–5.2)
Alkaline Phosphatase: 104 U/L (ref 39–117)
Calcium: 9.4 mg/dL (ref 8.4–10.5)
Chloride: 103 mEq/L (ref 96–112)
Creatinine, Ser: 0.79 mg/dL (ref 0.50–1.10)
GFR calc Af Amer: >90 mL/min (ref >90)
GFR calc non Af Amer: 86 mL/min — ABNORMAL LOW (ref >90)
Glucose, Bld: 82 mg/dL (ref 70–99)
Potassium: 3.9 mEq/L (ref 3.5–5.1)
Sodium: 138 mEq/L (ref 135–145)
Total Protein: 6.7 g/dL (ref 6.0–8.3)

## 2013-08-16 LAB — TROPONIN I
Troponin I: <0.3 ng/mL (ref <0.30)
Troponin I: <0.3 ng/mL (ref <0.30)

## 2013-08-16 LAB — GLUCOSE, CAPILLARY
Glucose-Capillary: 65 mg/dL — ABNORMAL LOW (ref 70–99)
Glucose-Capillary: 116 mg/dL — ABNORMAL HIGH (ref 70–99)
Glucose-Capillary: 185 mg/dL — ABNORMAL HIGH (ref 70–99)
Glucose-Capillary: 164 mg/dL — ABNORMAL HIGH (ref 70–99)

## 2013-08-16 SURGERY — ECHOCARDIOGRAM, TRANSESOPHAGEAL
Anesthesia: General

## 2013-08-16 MED ORDER — MIDAZOLAM HCL 10 MG/2ML IJ SOLN
INTRAMUSCULAR | Status: DC | PRN
  Administered 2013-08-16 (×2): 2 mg via INTRAVENOUS

## 2013-08-16 MED ORDER — BUTAMBEN-TETRACAINE-BENZOCAINE 2-2-14 % EX AERO
INHALATION_SPRAY | CUTANEOUS | Status: DC | PRN
  Administered 2013-08-16: 2 via TOPICAL

## 2013-08-16 MED ORDER — MIDAZOLAM HCL 5 MG/ML IJ SOLN
INTRAMUSCULAR | Status: AC
  Filled 2013-08-16: qty 2

## 2013-08-16 MED ORDER — FENTANYL CITRATE 0.05 MG/ML IJ SOLN
INTRAMUSCULAR | Status: DC | PRN
  Administered 2013-08-16: 50 ug via INTRAVENOUS

## 2013-08-16 MED ORDER — FENTANYL CITRATE 0.05 MG/ML IJ SOLN
INTRAMUSCULAR | Status: AC
  Filled 2013-08-16: qty 2

## 2013-08-16 MED ORDER — SODIUM CHLORIDE 0.9 % IV SOLN: INTRAVENOUS | Status: DC

## 2013-08-16 MED ORDER — SODIUM CHLORIDE 0.9 % IV SOLN
INTRAVENOUS | Status: DC | PRN
  Administered 2013-08-16: 10:00:00 via INTRAVENOUS

## 2013-08-16 MED ORDER — RIVAROXABAN 20 MG PO TABS: 20.0000 mg | ORAL_TABLET | Freq: Every day | ORAL | Status: DC

## 2013-08-16 MED ORDER — MIDAZOLAM HCL 5 MG/ML IJ SOLN
INTRAMUSCULAR | Status: AC
  Filled 2013-08-16: qty 1

## 2013-08-16 MED ORDER — DIPHENHYDRAMINE HCL 50 MG/ML IJ SOLN
INTRAMUSCULAR | Status: AC
  Filled 2013-08-16: qty 1

## 2013-08-16 MED ORDER — PROPOFOL 10 MG/ML IV BOLUS
INTRAVENOUS | Status: DC | PRN
  Administered 2013-08-16: 70 mg via INTRAVENOUS

## 2013-08-16 NOTE — Progress Notes (Signed)
Inpatient Diabetes Program Recommendations  AACE/ADA: New Consensus Statement on Inpatient Glycemic Control (2013)  Target Ranges:  Prepandial:   less than 140 mg/dL      Peak postprandial:   less than 180 mg/dL (1-2 hours)      Critically ill patients:  140 - 180 mg/dL   Reason for Visit: Spoke briefly to patient.  She is going to be discharged this afternoon.   She reports that she decreased basal rates during procedure.  Last CBG=164 mg/dL.  She has an Animas insulin pump and see's Dr. Lucianne Muss.  No further recommendations at this time.  Patient is anxious to go home.  Discussed with RN.  Beryl Meager, RN, BC-ADM Inpatient Diabetes Coordinator Pager [phone]

## 2013-08-16 NOTE — Preoperative (Signed)
Beta Blockers   Reason not to administer Beta Blockers:Not Applicable 

## 2013-08-16 NOTE — Anesthesia Postprocedure Evaluation (Signed)
Anesthesia Post Note  Patient: [name]  Procedure(s) Performed: Procedure(s) (LRB): TRANSESOPHAGEAL ECHOCARDIOGRAM (TEE) (N/A) CARDIOVERSION (N/A)  Anesthesia type: General  Patient location: PACU  Post pain: Pain level controlled  Post assessment: Patient's Cardiovascular Status Stable  Last Vitals:  Filed Vitals:   08/16/13 1051  BP: 100/52  Pulse:   Temp:   Resp:     Post vital signs: Reviewed and stable  Level of consciousness: alert  Complications: No apparent anesthesia complications

## 2013-08-16 NOTE — OR Nursing (Signed)
Vital signs for cardioversion recorded by CRNA

## 2013-08-16 NOTE — Interval H&P Note (Signed)
History and Physical Interval Note:  08/16/2013 9:56 AM  [name]  has presented today for surgery, with the diagnosis of a-flutter  The various methods of treatment have been discussed with the patient and family. After consideration of risks, benefits and other options for treatment, the patient has consented to  Procedure(s) with comments: TRANSESOPHAGEAL ECHOCARDIOGRAM (TEE) (N/A) CARDIOVERSION (N/A) - spoke with Tom as a surgical intervention .  The patient's history has been reviewed, patient examined, no change in status, stable for surgery.  I have reviewed the patient's chart and labs.  Questions were answered to the patient's satisfaction.     Elyn Aquas.

## 2013-08-16 NOTE — Anesthesia Procedure Notes (Signed)
Procedure Name: MAC Date/Time: 08/16/2013 10:35 AM Performed by: Rollene Rotunda Pre-anesthesia Checklist: Patient identified, Patient being monitored, Emergency Drugs available, Timeout performed and Suction available Patient Re-evaluated:Patient Re-evaluated prior to inductionOxygen Delivery Method: Ambu bag Preoxygenation: Pre-oxygenation with 100% oxygen Intubation Type: IV induction Ventilation: Mask ventilation without difficulty

## 2013-08-16 NOTE — Transfer of Care (Signed)
Immediate Anesthesia Transfer of Care Note  Patient: [name]  Procedure(s) Performed: Procedure(s) with comments: TRANSESOPHAGEAL ECHOCARDIOGRAM (TEE) (N/A) CARDIOVERSION (N/A) - spoke with Tom  Patient Location: PACU  Anesthesia Type:General  Level of Consciousness: awake, alert , oriented and patient cooperative  Airway & Oxygen Therapy: Patient Spontanous Breathing and Patient connected to nasal cannula oxygen  Post-op Assessment: Report given to PACU RN, Post -op Vital signs reviewed and stable and Patient moving all extremities  Post vital signs: Reviewed and stable  Complications: No apparent anesthesia complications

## 2013-08-16 NOTE — CV Procedure (Signed)
    Transesophageal Echocardiogram Note  [name] [id number] 1946-03-02  Procedure: Transesophageal Echocardiogram Indications: atrial flutter  Procedure Details Consent: Obtained Time Out: Verified patient identification, verified procedure, site/side was marked, verified correct patient position, special equipment/implants available, Radiology Safety Procedures followed,  medications/allergies/relevent history reviewed, required imaging and test results available.  Performed  Medications: Fentanyl: 50 mcg IV Versed: 4 mg IV  Left Ventrical:  Normal LV function  Mitral Valve: mild MR,  Aortic Valve: normal AV  Tricuspid Valve: mild TR  Pulmonic Valve: normal  Left Atrium/ Left atrial appendage: normal, large, no thrombus  Atrial septum: intact by color flow.   Aorta: normal   Complications: No apparent complications Patient did tolerate procedure well.  She is clear for cardioversion.      Cardioversion Note  [name] [id number] June 10, 1946  Procedure: DC Cardioversion Indications: atrial flutter   Procedure Details Consent: Obtained Time Out: Verified patient identification, verified procedure, site/side was marked, verified correct patient position, special equipment/implants available, Radiology Safety Procedures followed,  medications/allergies/relevent history reviewed, required imaging and test results available.  Performed  The patient has been on adequate anticoagulation.  The patient received IV Propofol 70 mg  for sedation.  Synchronous cardioversion was performed at 20  joules.  The cardioversion was successful    Complications: No apparent complications Patient did tolerate procedure well.   Vesta Mixer, Montez Hageman., MD, [facility] 08/16/2013, 10:29 AM

## 2013-08-16 NOTE — Discharge Summary (Signed)
Discharge Summary   Patient ID: [name] MRN: [id number], DOB/AGE: [birth year] 67 y.o. Admit date: 08/15/2013 D/C date:     08/16/2013  Primary Cardiologist: [name]  Primary Discharge Diagnoses:  1. Atrial flutter - s/p TEE/DCCV this admission 2. Mild mitral regurgitation by TEE  3. Mild tricuspid regurgitation by TEE  Secondary Discharge Diagnoses:  1. History of atrial fibrillation 2. CAD s/p LAD Promus stent 2009 3. HLD 4. Osteopenia  5. Arthritis 6. Diabetes mellitus 7. Fibroid  Hospital Course: [name] is a 67 y/o F with history of atrial fibrillation, CAD s/p stent to LAD 2009, and HLD who presented to [facility] with rapid heart rate lasting probably greater than 2 days. She has been feeling somewhat tired and had presyncope. She didn't describe any chest pressure, neck or arm discomfort. She initially presented to urgent care where she was found to be in rapid atrial flutter (150bpm) and was transferred to the ER for admission. She did slow to ventricular in the 50s with IV Cardizem which has to be stopped. Blood pressure was also borderline (99/47). EKg did not show any acute ST-T changes. She was admitted for further management. TSH was normal and troponins were negative x 3. Labwork was grossly unremarkable although pBNP was 1755. She had mild crackles on exam but did not require diuresis. She was started on Xarelto for anticoagulation (CHADSVASC = 4). She was educated on indications and recommendation to observe for any bleeding. She underwent TEE today demonstrating normal LV function, mild MR/TR, normal large LAA without thrombus. She subsequently underwent successful DCCV to NSR. She did well without any post-procedure complications. Dr. Elease Hashimoto has seen and examined the patient today and feels she is stable for discharge. Per discussion with him, we will continue the patient's Plavix for now along with Xarelto but discontinue aspirin.  She will follow up in  the office in several weeks and will also be plugged into our [facility] given new to Xarelto.  Discharge Vitals: Blood pressure 129/56, pulse 74, temperature 97.4 F (36.3 C), temperature source Oral, resp. rate 18, height 5\' 9"  (1.753 m), weight 201 lb 15.1 oz (91.6 kg), SpO2 97.00%.  Labs: Lab Results  Component Value Date   WBC 5.5 08/15/2013   HGB 16.5* 08/15/2013   HCT 46.0 08/15/2013   MCV 89.7 08/15/2013   PLT 268 08/15/2013     Recent Labs Lab 08/16/13 0245  NA 138  K 3.9  CL 103  CO2 22  BUN 15  CREATININE 0.79  CALCIUM 9.4  PROT 6.7  BILITOT 0.6  ALKPHOS 104  ALT 30  AST 22  GLUCOSE 82    Recent Labs  08/15/13 1325 08/15/13 2041 08/16/13 0243 08/16/13 0735  TROPONINI <0.30 <0.30 <0.30 <0.30   Lab Results  Component Value Date   CHOL 140 07/12/2013   HDL 57.30 07/12/2013   LDLCALC 70 07/12/2013   TRIG 64.0 07/12/2013   No results found for this basename: DDIMER    Diagnostic Studies/Procedures   Dg Chest Portable 1 View 08/15/2013   CLINICAL DATA:  Atrial fibrillation. Tachycardia.  EXAM: PORTABLE CHEST - 1 VIEW  COMPARISON:  CHEST x-ray 04/09/2012.  FINDINGS: Lung volumes are normal. No acute consolidative airspace disease. No pleural effusions. Mild cephalization of the pulmonary vasculature, without frank pulmonary edema. Borderline cardiomegaly. Upper mediastinal contours are within normal limits.  IMPRESSION: 1. Borderline cardiomegaly with mild cephalization of the pulmonary vasculature, but no frank pulmonary edema at this  time.   Electronically Signed   By: Trudie Reed M.D.   On: 08/15/2013 14:12    Discharge Medications     Medication List    STOP taking these medications       aspirin 325 MG tablet      TAKE these medications       CALCIUM + D PO  Take 600 mg by mouth 2 (two) times daily.     clopidogrel 75 MG tablet  Commonly known as:  PLAVIX  Take 1 tablet (75 mg total) by mouth daily.     diltiazem 120 MG 24 hr  capsule  Commonly known as:  CARDIZEM CD  Take 1 capsule (120 mg total) by mouth daily.     FINACEA PLUS 15 % Kit  Apply 1 application topically at bedtime.     insulin pump 100 unit/ml Soln  Inject into the skin. Novolog Vial, max 200 units per day with pump     metoprolol succinate 100 MG 24 hr tablet  Commonly known as:  TOPROL-XL  Take 1.5 tablet by mouth daily     MIRVASO 0.33 % Gel  Generic drug:  Brimonidine Tartrate  Apply 1 application topically daily.     niacin 1000 MG CR tablet  Commonly known as:  NIASPAN  Take 1 tablet (1,000 mg total) by mouth at bedtime.     nitroGLYCERIN 0.4 MG SL tablet  Commonly known as:  NITROSTAT  Place 1 tablet (0.4 mg total) under the tongue every 5 (five) minutes as needed.     pioglitazone 15 MG tablet  Commonly known as:  ACTOS  Take 1 tablet (15 mg total) by mouth daily.     Rivaroxaban 20 MG Tabs tablet  Commonly known as:  XARELTO  Take 1 tablet (20 mg total) by mouth daily with supper.     rosuvastatin 40 MG tablet  Commonly known as:  CRESTOR  Take 1 tablet (40 mg total) by mouth daily.     VITAMIN D PO  Take 1,000 mg by mouth daily.        Disposition   The patient will be discharged in stable condition to home. Discharge Orders   Future Appointments Provider Department Dept Phone   09/13/2013 2:30 PM Rosalio Macadamia, NP [facility] [place] Office [phone]   09/13/2013 3:00 PM Evie Lacks, [facility] [place] Office [phone]   09/27/2013 9:30 AM Newt Lukes, MD [facility] [phone]   10/11/2013 9:45 AM [facility] [place] [facility] [phone]   10/15/2013 10:00 AM Reather Littler, MD [facility] END[facility] [phone]   Future Orders Complete By Expires   Diet - low sodium heart healthy  As directed    Increase activity slowly  As directed      Follow-up Information   Follow up with Norma Fredrickson, NP. (09/13/13 at  2:30pm)    Specialty:  Nurse Practitioner   Contact information:   [address] [place] [address] [phone]       Follow up with [facility]. ([facility] - Check-in Visit since you are new to Xarelto - same day as follow-up appointment - 09/13/13 at 3pm)    Contact information:   [address] [place] [address]         Duration of Discharge Encounter: Greater than 30 minutes including physician and PA time.  Signed, Dayna Dunn PA-C 08/16/2013, 2:15 PM  Attending Note:   The patient was seen and examined.  Agree with assessment and plan as noted above.  Changes made to the above note as needed.  Latiana has done well today.  She had a TEE with cardioversion and feels better. We will have her see Lawson Fiscal in the next several weeks.  I will see her in several months.  She needs to see EP to be considered for atrial flutter ablation.   Vesta Mixer, Montez Hageman., MD, [facility] 08/16/2013, 6:12 PM

## 2013-08-16 NOTE — Progress Notes (Signed)
Hypoglycemic Event  CBG: 65  Treatment: 118 ml of 100% orange Juice   Symptoms: none  Follow-up CBG: Time: 0302 CBG Result:116  Possible Reasons for Event: Not adquet food intake  Comments/MD notified:No    [name]  Remember to initiate Hypoglycemia Order Set & complete

## 2013-08-16 NOTE — Anesthesia Preprocedure Evaluation (Signed)
Anesthesia Evaluation  Patient identified by MRN, date of birth, ID band Patient awake    Reviewed: Allergy & Precautions, H&P , NPO status , Patient's Chart, lab work & pertinent test results, reviewed documented beta blocker date and time   Airway Mallampati: II TM Distance: >3 FB Neck ROM: full    Dental   Pulmonary neg pulmonary ROS,  breath sounds clear to auscultation        Cardiovascular + CAD and + Cardiac Stents + dysrhythmias Atrial Fibrillation Rhythm:regular     Neuro/Psych negative neurological ROS  negative psych ROS   GI/Hepatic negative GI ROS, Neg liver ROS,   Endo/Other  diabetes, Insulin Dependent  Renal/GU negative Renal ROS  negative genitourinary   Musculoskeletal   Abdominal   Peds  Hematology negative hematology ROS (+)   Anesthesia Other Findings See surgeon's H&P   Reproductive/Obstetrics negative OB ROS                           Anesthesia Physical Anesthesia Plan  ASA: III  Anesthesia Plan: General   Post-op Pain Management:    Induction: Intravenous  Airway Management Planned: Mask  Additional Equipment:   Intra-op Plan:   Post-operative Plan:   Informed Consent: I have reviewed the patients History and Physical, chart, labs and discussed the procedure including the risks, benefits and alternatives for the proposed anesthesia with the patient or authorized representative who has indicated his/her understanding and acceptance.   Dental Advisory Given  Plan Discussed with: CRNA and Surgeon  Anesthesia Plan Comments:         Anesthesia Quick Evaluation

## 2013-08-16 NOTE — H&P (View-Only) (Signed)
CARDIOLOGY ADMISSION NOTE  Patient ID: EVANELL REDLICH MRN: [id number] DOB/AGE: Jan 18, 1946 67 y.o.  Admit date: 08/15/2013 Primary Physician   Rene Paci, MD Primary Cardiologist     Jazari Ober Chief Complaint    Rapid heart rate  HPI: The patient presents for evaluation of rapid heart rate.  She has a history of atrial fib/flutter and CAD.   She is s/p stent to the LAD in 2009.  Nuclear study in was low risk with questionable small distal anterior infarct in 2012.  While she is reported to have had flutter and fib in the past, I do not see flutter but I see documentation of fibrillation.  However, she is clear that she has been told that she had flutter as well in the past.  She presented to the ER today with rapid heart rate lasting probably greater than two days.  Initially the HR was 150.  EKG was atrial flutter with 2:1 conduction predominant.  She did slow to ventricular rates in the 50s with IV Cardizem which has to be stopped.  She reports that she was feeling somewhat tired. She got a little presyncopal. She didn't describe any chest pressure, neck or arm discomfort. She did notice that her heart was rapid. Finally presented to urgent care and referred to the emergency room. She's had no new SOB/PND or orthopnea.   She feels fairly well.  She can tell that her HR is fast.  No cp . No dyspnea.     Past Medical History  Diagnosis Date  . Coronary artery disease     2009 LAD Promus stent  . Hyperlipidemia   . Atrial flutter   . Osteopenia   . Arthritis   . Type 1 diabetes mellitus on insulin therapy   . Fibroid     Past Surgical History  Procedure Laterality Date  . Coronary angioplasty with stent placement    . Pelvic laparoscopy    . Myomectomy  1977  . Appendectomy    . Cataract extraction      Allergies  Allergen Reactions  . Septra [Bactrim] Rash  . Sulfa Antibiotics Rash   No current facility-administered medications on file prior to encounter.   Current  Outpatient Prescriptions on File Prior to Encounter  Medication Sig Dispense Refill  . aspirin 325 MG tablet Take 1 tablet (325 mg total) by mouth daily. 30 minutes before niaspan      . Azelaic Acid-Cleanser-Lotion (FINACEA PLUS) 15 % KIT Apply 1 application topically at bedtime.       . Brimonidine Tartrate (MIRVASO) 0.33 % GEL Apply 1 application topically daily.       . Calcium Carbonate-Vitamin D (CALCIUM + D PO) Take 600 mg by mouth 2 (two) times daily.       . Cholecalciferol (VITAMIN D PO) Take 1,000 mg by mouth daily.       . clopidogrel (PLAVIX) 75 MG tablet Take 1 tablet (75 mg total) by mouth daily.  30 tablet  0  . diltiazem (CARDIZEM CD) 120 MG 24 hr capsule Take 1 capsule (120 mg total) by mouth daily.  90 capsule  3  . metoprolol succinate (TOPROL-XL) 100 MG 24 hr tablet Take 1.5 tablet by mouth daily  135 tablet  3  . niacin (NIASPAN) 1000 MG CR tablet Take 1 tablet (1,000 mg total) by mouth at bedtime.  90 tablet  3  . pioglitazone (ACTOS) 15 MG tablet Take 1 tablet (15 mg total) by mouth daily.  90 tablet  3  . rosuvastatin (CRESTOR) 40 MG tablet Take 1 tablet (40 mg total) by mouth daily.  90 tablet  3  . ciprofloxacin (CIPRO) 500 MG tablet Take 1 tablet (500 mg total) by mouth 2 (two) times daily.  10 tablet  0  . Insulin Aspart (INSULIN PUMP) 100 unit/ml SOLN Inject into the skin. Novolog Vial, max 200 units per day with pump      . nitroGLYCERIN (NITROSTAT) 0.4 MG SL tablet Place 1 tablet (0.4 mg total) under the tongue every 5 (five) minutes as needed.  25 tablet  11   History   Social History  . Marital Status: Married    Spouse Name: N/A    Number of Children: N/A  . Years of Education: N/A   Occupational History  . Not on file.   Social History Main Topics  . Smoking status: Never Smoker   . Smokeless tobacco: Not on file  . Alcohol Use: 1.2 oz/week    2 Glasses of wine per week  . Drug Use: No  . Sexual Activity: Yes    Birth Control/ Protection:  Post-menopausal   Other Topics Concern  . Not on file   Social History Narrative  . No narrative on file    Family History  Problem Relation Age of Onset  . Atrial fibrillation Mother   . Diabetes Mother   . Hypertension Mother   . CAD Father 65    died age 67  . CAD Brother 30    small vessel disease  . CAD Cousin     paternal  . CAD Paternal Grandfather     early onset    ROS:  As stated in the HPI and negative for all other systems.  Physical Exam: Blood pressure 106/68, pulse 130, temperature 97.5 F (36.4 C), temperature source Oral, resp. rate 18, height 5\' 9"  (1.753 m), weight 201 lb 15.1 oz (91.6 kg), SpO2 99.00%.  GENERAL:  Well appearing HEENT:  Pupils equal round and reactive, fundi not visualized, oral mucosa unremarkable NECK:  No jugular venous distention, waveform within normal limits, carotid upstroke brisk and symmetric, no bruits, no thyromegaly LYMPHATICS:  No cervical, inguinal adenopathy LUNGS:  Bilateral crackles at the bases BACK:  No CVA tenderness CHEST:  Unremarkable HEART:  PMI not displaced or sustained,S1 and S2 within normal limits, no S3, no clicks, no rubs, no murmurs ABD:  Flat, positive bowel sounds normal in frequency in pitch, no bruits, no rebound, no guarding, no midline pulsatile mass, no hepatomegaly, no splenomegaly EXT:  2 plus pulses throughout, no edema, no cyanosis no clubbing SKIN:  No rashes no nodules NEURO:  Cranial nerves II through XII grossly intact, motor grossly intact throughout PSYCH:  Cognitively intact, oriented to person place and time  Labs: Lab Results  Component Value Date   BUN 15 08/16/2013   Lab Results  Component Value Date   CREATININE 0.79 08/16/2013   Lab Results  Component Value Date   NA 138 08/16/2013   K 3.9 08/16/2013   CL 103 08/16/2013   CO2 22 08/16/2013   Lab Results  Component Value Date   TROPONINI <0.30 08/16/2013   Lab Results  Component Value Date   WBC 5.5 08/15/2013    HGB 16.5* 08/15/2013   HCT 46.0 08/15/2013   MCV 89.7 08/15/2013   PLT 268 08/15/2013    Radiology:  CXR:  1. Borderline cardiomegaly with mild cephalization of the pulmonary  vasculature, but no  frank pulmonary edema at this time.  EKG:  08/16/2013  Atrial flutter with variable conduction.  Rate 137, no acute ST T wave changes.   ASSESSMENT AND PLAN:    Atrial flutter:  The patient  Is doing well.  I have scheduled her for TEE /CArdioversion this am.  She should be able to go home this afternoon if she is stable.   Risks, benefits discussed with patient.  Have written orders.   CAD:  No angina.     Vesta Mixer, Montez Hageman., MD, [facility] 08/16/2013, 8:18 AM Office - [phone] Pager [phone]

## 2013-08-16 NOTE — Progress Notes (Signed)
Echocardiogram Echocardiogram Transesophageal has been performed.  [name] 08/16/2013, 10:49 AM

## 2013-08-16 NOTE — Progress Notes (Signed)
 CARDIOLOGY ADMISSION NOTE  Patient ID: [name] MRN: [id number] DOB/AGE: 65/17/1947 67 y.o.  Admit date: 08/15/2013 Primary Physician   Valerie Leschber, MD Primary Cardiologist     [name] Chief Complaint    Rapid heart rate  HPI: The patient presents for evaluation of rapid heart rate.  She has a history of atrial fib/flutter and CAD.   She is s/p stent to the LAD in 2009.  Nuclear study in was low risk with questionable small distal anterior infarct in 2012.  While she is reported to have had flutter and fib in the past, I do not see flutter but I see documentation of fibrillation.  However, she is clear that she has been told that she had flutter as well in the past.  She presented to the ER today with rapid heart rate lasting probably greater than two days.  Initially the HR was 150.  EKG was atrial flutter with 2:1 conduction predominant.  She did slow to ventricular rates in the 50s with IV Cardizem which has to be stopped.  She reports that she was feeling somewhat tired. She got a little presyncopal. She didn't describe any chest pressure, neck or arm discomfort. She did notice that her heart was rapid. Finally presented to urgent care and referred to the emergency room. She's had no new SOB/PND or orthopnea.   She feels fairly well.  She can tell that her HR is fast.  No cp . No dyspnea.     Past Medical History  Diagnosis Date  . Coronary artery disease     2009 LAD Promus stent  . Hyperlipidemia   . Atrial flutter   . Osteopenia   . Arthritis   . Type 1 diabetes mellitus on insulin therapy   . Fibroid     Past Surgical History  Procedure Laterality Date  . Coronary angioplasty with stent placement    . Pelvic laparoscopy    . Myomectomy  1977  . Appendectomy    . Cataract extraction      Allergies  Allergen Reactions  . Septra [Bactrim] Rash  . Sulfa Antibiotics Rash   No current facility-administered medications on file prior to encounter.   Current  Outpatient Prescriptions on File Prior to Encounter  Medication Sig Dispense Refill  . aspirin 325 MG tablet Take 1 tablet (325 mg total) by mouth daily. 30 minutes before niaspan      . Azelaic Acid-Cleanser-Lotion (FINACEA PLUS) 15 % KIT Apply 1 application topically at bedtime.       . Brimonidine Tartrate (MIRVASO) 0.33 % GEL Apply 1 application topically daily.       . Calcium Carbonate-Vitamin D (CALCIUM + D PO) Take 600 mg by mouth 2 (two) times daily.       . Cholecalciferol (VITAMIN D PO) Take 1,000 mg by mouth daily.       . clopidogrel (PLAVIX) 75 MG tablet Take 1 tablet (75 mg total) by mouth daily.  30 tablet  0  . diltiazem (CARDIZEM CD) 120 MG 24 hr capsule Take 1 capsule (120 mg total) by mouth daily.  90 capsule  3  . metoprolol succinate (TOPROL-XL) 100 MG 24 hr tablet Take 1.5 tablet by mouth daily  135 tablet  3  . niacin (NIASPAN) 1000 MG CR tablet Take 1 tablet (1,000 mg total) by mouth at bedtime.  90 tablet  3  . pioglitazone (ACTOS) 15 MG tablet Take 1 tablet (15 mg total) by mouth daily.    90 tablet  3  . rosuvastatin (CRESTOR) 40 MG tablet Take 1 tablet (40 mg total) by mouth daily.  90 tablet  3  . ciprofloxacin (CIPRO) 500 MG tablet Take 1 tablet (500 mg total) by mouth 2 (two) times daily.  10 tablet  0  . Insulin Aspart (INSULIN PUMP) 100 unit/ml SOLN Inject into the skin. Novolog Vial, max 200 units per day with pump      . nitroGLYCERIN (NITROSTAT) 0.4 MG SL tablet Place 1 tablet (0.4 mg total) under the tongue every 5 (five) minutes as needed.  25 tablet  11   History   Social History  . Marital Status: Married    Spouse Name: N/A    Number of Children: N/A  . Years of Education: N/A   Occupational History  . Not on file.   Social History Main Topics  . Smoking status: Never Smoker   . Smokeless tobacco: Not on file  . Alcohol Use: 1.2 oz/week    2 Glasses of wine per week  . Drug Use: No  . Sexual Activity: Yes    Birth Control/ Protection:  Post-menopausal   Other Topics Concern  . Not on file   Social History Narrative  . No narrative on file    Family History  Problem Relation Age of Onset  . Atrial fibrillation Mother   . Diabetes Mother   . Hypertension Mother   . CAD Father 37    died age 39  . CAD Brother 30    small vessel disease  . CAD Cousin     paternal  . CAD Paternal Grandfather     early onset    ROS:  As stated in the HPI and negative for all other systems.  Physical Exam: Blood pressure 106/68, pulse 130, temperature 97.5 F (36.4 C), temperature source Oral, resp. rate 18, height 5' 9" (1.753 m), weight 201 lb 15.1 oz (91.6 kg), SpO2 99.00%.  GENERAL:  Well appearing HEENT:  Pupils equal round and reactive, fundi not visualized, oral mucosa unremarkable NECK:  No jugular venous distention, waveform within normal limits, carotid upstroke brisk and symmetric, no bruits, no thyromegaly LYMPHATICS:  No cervical, inguinal adenopathy LUNGS:  Bilateral crackles at the bases BACK:  No CVA tenderness CHEST:  Unremarkable HEART:  PMI not displaced or sustained,S1 and S2 within normal limits, no S3, no clicks, no rubs, no murmurs ABD:  Flat, positive bowel sounds normal in frequency in pitch, no bruits, no rebound, no guarding, no midline pulsatile mass, no hepatomegaly, no splenomegaly EXT:  2 plus pulses throughout, no edema, no cyanosis no clubbing SKIN:  No rashes no nodules NEURO:  Cranial nerves II through XII grossly intact, motor grossly intact throughout PSYCH:  Cognitively intact, oriented to person place and time  Labs: Lab Results  Component Value Date   BUN 15 08/16/2013   Lab Results  Component Value Date   CREATININE 0.79 08/16/2013   Lab Results  Component Value Date   NA 138 08/16/2013   K 3.9 08/16/2013   CL 103 08/16/2013   CO2 22 08/16/2013   Lab Results  Component Value Date   TROPONINI <0.30 08/16/2013   Lab Results  Component Value Date   WBC 5.5 08/15/2013    HGB 16.5* 08/15/2013   HCT 46.0 08/15/2013   MCV 89.7 08/15/2013   PLT 268 08/15/2013    Radiology:  CXR:  1. Borderline cardiomegaly with mild cephalization of the pulmonary  vasculature, but no   frank pulmonary edema at this time.  EKG:  08/16/2013  Atrial flutter with variable conduction.  Rate 137, no acute ST T wave changes.   ASSESSMENT AND PLAN:    Atrial flutter:  The patient  Is doing well.  I have scheduled her for TEE /CArdioversion this am.  She should be able to go home this afternoon if she is stable.   Risks, benefits discussed with patient.  Have written orders.   CAD:  No angina.     Philip J. [name], Jr., MD, FACC 08/16/2013, 8:18 AM Office - [phone] Pager [phone]     

## 2013-08-16 NOTE — Care Management Note (Signed)
    Page 1 of 1   08/16/2013     1:36:15 PM   CARE MANAGEMENT NOTE 08/16/2013  Patient:  [name], [name]   Account Number:  [id number]  Date Initiated:  08/16/2013  Documentation initiated by:  Reign Dziuba  Subjective/Objective Assessment:   adm with dx of AFlutter; lives with spouse     CM consult      Per UR Regulation:  Reviewed for med. necessity/level of care/duration of stay  Comments:  08/16/13 1332 Donnica Jarnagin RN MSN BSN CCM Per rep at [employer], Carlena Hurl is a covered medication, tier 3.  Name brand co-pay is $6.35, and specialty drugs have a 5% co-insurance.  [facility] is the preferred pharmacy.

## 2013-08-17 ENCOUNTER — Telehealth: Payer: Self-pay | Admitting: *Deleted

## 2013-08-17 ENCOUNTER — Other Ambulatory Visit: Payer: Self-pay

## 2013-08-17 NOTE — Telephone Encounter (Signed)
[name] needs a referral to EP for consult for flutter ablation. Per Dr Elease Hashimoto. msg sent to [facility] to call and schedule

## 2013-08-18 ENCOUNTER — Encounter (HOSPITAL_COMMUNITY): Payer: Self-pay | Admitting: Cardiovascular Disease

## 2013-09-02 ENCOUNTER — Encounter: Payer: Self-pay | Admitting: Internal Medicine

## 2013-09-02 ENCOUNTER — Telehealth: Payer: Self-pay | Admitting: *Deleted

## 2013-09-02 ENCOUNTER — Encounter: Payer: Self-pay | Admitting: *Deleted

## 2013-09-02 ENCOUNTER — Ambulatory Visit (INDEPENDENT_AMBULATORY_CARE_PROVIDER_SITE_OTHER): Payer: Medicare Other | Admitting: Internal Medicine

## 2013-09-02 VITALS — BP 138/72 | HR 60 | Ht 69.0 in | Wt 204.0 lb

## 2013-09-02 DIAGNOSIS — I2510 Atherosclerotic heart disease of native coronary artery without angina pectoris: Secondary | ICD-10-CM | POA: Diagnosis not present

## 2013-09-02 DIAGNOSIS — I4892 Unspecified atrial flutter: Secondary | ICD-10-CM

## 2013-09-02 LAB — CBC WITH DIFFERENTIAL/PLATELET
Basophils Relative: 0.6 % (ref 0.0–3.0)
Eosinophils Absolute: 0.2 10*3/uL (ref 0.0–0.7)
Eosinophils Relative: 2.6 % (ref 0.0–5.0)
HCT: 44.2 % (ref 36.0–46.0)
Lymphs Abs: 2 10*3/uL (ref 0.7–4.0)
MCHC: 34.2 g/dL (ref 30.0–36.0)
MCV: 91.5 fl (ref 78.0–100.0)
Monocytes Absolute: 0.7 10*3/uL (ref 0.1–1.0)
Monocytes Relative: 11.7 % (ref 3.0–12.0)
Neutro Abs: 3 10*3/uL (ref 1.4–7.7)
Neutrophils Relative %: 50.8 % (ref 43.0–77.0)
RBC: 4.83 Mil/uL (ref 3.87–5.11)
WBC: 5.9 10*3/uL (ref 4.5–10.5)

## 2013-09-02 LAB — BASIC METABOLIC PANEL
BUN: 11 mg/dL (ref 6–23)
CO2: 30 mEq/L (ref 19–32)
Calcium: 9.2 mg/dL (ref 8.4–10.5)
GFR: 95.32 mL/min (ref >60.00)
Glucose, Bld: 46 mg/dL — CL (ref 70–99)
Potassium: 3.7 mEq/L (ref 3.5–5.1)
Sodium: 139 mEq/L (ref 135–145)

## 2013-09-02 LAB — PROTIME-INR: Prothrombin Time: 11.9 s (ref 10.2–12.4)

## 2013-09-02 NOTE — Assessment & Plan Note (Signed)
The patient has recurrent and symptomatic atrial flutter. I discussed the treatment options in detail. The risk, goals, benefits, and expectations of catheter ablation have been discussed with the patient in detail, and she wishes to proceed.

## 2013-09-02 NOTE — Progress Notes (Signed)
HPI [name] is referred today for atrial flutter. She is a pleasant 67 yo woman with a h/o CAD, s/p PCI, recurrent atrial flutter, s/p TEE guided cardioversion, DM and HTN. The patient feels sob, dizzy and light headed when she is in flutter. She denies syncope. She has had two episodes requiring cardioversion and would "like to get something done". I cannot find any clear cut atrial fibrillation. With her history of CAD, she is not a good candidate for class 1C AA drugs. She would like to get off of her anti-coagulation. Allergies  Allergen Reactions  . Septra [Bactrim] Rash  . Sulfa Antibiotics Rash     Current Outpatient Prescriptions  Medication Sig Dispense Refill  . Azelaic Acid-Cleanser-Lotion (FINACEA PLUS) 15 % KIT Apply 1 application topically at bedtime.       . Brimonidine Tartrate (MIRVASO) 0.33 % GEL Apply 1 application topically daily.       . Calcium Carbonate-Vitamin D (CALCIUM + D PO) Take 600 mg by mouth 2 (two) times daily.       . Cholecalciferol (VITAMIN D PO) Take 1,000 mg by mouth daily.       . clopidogrel (PLAVIX) 75 MG tablet Take 1 tablet (75 mg total) by mouth daily.  30 tablet  0  . diltiazem (CARDIZEM CD) 120 MG 24 hr capsule Take 1 capsule (120 mg total) by mouth daily.  90 capsule  3  . HUMALOG 100 UNIT/ML injection As directed      . Insulin Aspart (INSULIN PUMP) 100 unit/ml SOLN Inject into the skin. Novolog Vial, max 200 units per day with pump      . metoprolol succinate (TOPROL-XL) 100 MG 24 hr tablet Take 1.5 tablet by mouth daily  135 tablet  3  . niacin (NIASPAN) 1000 MG CR tablet Take 1 tablet (1,000 mg total) by mouth at bedtime.  90 tablet  3  . nitroGLYCERIN (NITROSTAT) 0.4 MG SL tablet Place 1 tablet (0.4 mg total) under the tongue every 5 (five) minutes as needed.  25 tablet  11  . pioglitazone (ACTOS) 15 MG tablet Take 1 tablet (15 mg total) by mouth daily.  90 tablet  3  . Rivaroxaban (XARELTO) 20 MG TABS tablet Take 1 tablet (20  mg total) by mouth daily with supper.  30 tablet  6  . rosuvastatin (CRESTOR) 40 MG tablet Take 1 tablet (40 mg total) by mouth daily.  90 tablet  3   No current facility-administered medications for this visit.     Past Medical History  Diagnosis Date  . Coronary artery disease     2009 LAD Promus stent  . Hyperlipidemia   . Atrial fibrillation   . Osteopenia   . Arthritis   . Type 1 diabetes mellitus on insulin therapy   . Fibroid   . Atrial flutter     a. s/p TEE/DCCV 08/16/13 (normal LV function, no LAA thrombus).  . Valvular heart disease     a. Mild MR/TR by TEE 08/2013.    ROS:   All systems reviewed and negative except as noted in the HPI.   Past Surgical History  Procedure Laterality Date  . Coronary angioplasty with stent placement    . Pelvic laparoscopy    . Myomectomy  1977  . Appendectomy    . Cataract extraction    . Tee without cardioversion N/A 08/16/2013    Procedure: TRANSESOPHAGEAL ECHOCARDIOGRAM (TEE);  Surgeon: Vesta Mixer, MD;  Location:  [facility] ENDOSCOPY;  Service: Cardiovascular;  Laterality: N/A;  . Cardioversion N/A 08/16/2013    Procedure: CARDIOVERSION;  Surgeon: Vesta Mixer, MD;  Location: [facility] ENDOSCOPY;  Service: Cardiovascular;  Laterality: N/A;  spoke with Tom     Family History  Problem Relation Age of Onset  . Atrial fibrillation Mother   . Diabetes Mother   . Hypertension Mother   . CAD Father 11    died age 87  . CAD Brother 30    small vessel disease  . CAD Cousin     paternal  . CAD Paternal Grandfather     early onset     History   Social History  . Marital Status: Married    Spouse Name: N/A    Number of Children: N/A  . Years of Education: N/A   Occupational History  . Not on file.   Social History Main Topics  . Smoking status: Never Smoker   . Smokeless tobacco: Not on file  . Alcohol Use: 1.2 oz/week    2 Glasses of wine per week  . Drug Use: No  . Sexual Activity: Yes    Birth Control/  Protection: Post-menopausal   Other Topics Concern  . Not on file   Social History Narrative  . No narrative on file     BP 138/72  Pulse 60  Ht 5\' 9"  (1.753 m)  Wt 204 lb (92.534 kg)  BMI 30.11 kg/m2  Physical Exam:  Well appearing 67 yo woman, who looks younger than her stated age, and in NAD HEENT: Unremarkable Neck:  No JVD, no thyromegally Back:  No CVA tenderness Lungs:  Clear with no wheezes, rales, or rhonchi HEART:  Regular rate rhythm, no murmurs, no rubs, no clicks Abd:  soft, positive bowel sounds, no organomegally, no rebound, no guarding Ext:  2 plus pulses, no edema, no cyanosis, no clubbing Skin:  No rashes no nodules Neuro:  CN II through XII intact, motor grossly intact  EKG - normal sinus rhythm  Assess/Plan:

## 2013-09-02 NOTE — Assessment & Plan Note (Signed)
She denies anginal symptoms. No change in medical therapy. 

## 2013-09-02 NOTE — Telephone Encounter (Signed)
Critical glucose 46/ pt was called on cell and she already  knew her glucose was low, taking glucose tablets till she gets home for a meal. Pt states she is feeling better than when she was in the office.

## 2013-09-02 NOTE — Patient Instructions (Signed)
Your physician has recommended that you have an ablation. Catheter ablation is a medical procedure used to treat some cardiac arrhythmias (irregular heartbeats). During catheter ablation, a long, thin, flexible tube is put into a blood vessel in your groin (upper thigh), or neck. This tube is called an ablation catheter. It is then guided to your heart through the blood vessel. Radio frequency waves destroy small areas of heart tissue where abnormal heartbeats may cause an arrhythmia to start. Please see the instruction sheet given to you today.  You will need to have lab work today: cbc, bmp, pt  Continue your current medications as directed

## 2013-09-06 DIAGNOSIS — Z1231 Encounter for screening mammogram for malignant neoplasm of breast: Secondary | ICD-10-CM | POA: Diagnosis not present

## 2013-09-07 ENCOUNTER — Encounter (HOSPITAL_COMMUNITY): Payer: Self-pay | Admitting: Pharmacy Technician

## 2013-09-08 ENCOUNTER — Ambulatory Visit: Payer: Medicare Other | Admitting: Internal Medicine

## 2013-09-08 ENCOUNTER — Telehealth: Payer: Self-pay | Admitting: Nutrition

## 2013-09-08 ENCOUNTER — Encounter: Payer: Self-pay | Admitting: Internal Medicine

## 2013-09-08 NOTE — Telephone Encounter (Signed)
QUESTION ABOUT REDUCING INSULIN DOSE PRIOR TO A SURGERY, PLEASE CALL CELL# [phone] / Roanna Raider

## 2013-09-09 ENCOUNTER — Other Ambulatory Visit: Payer: Self-pay

## 2013-09-13 ENCOUNTER — Encounter: Payer: Medicare Other | Admitting: Nurse Practitioner

## 2013-09-13 ENCOUNTER — Ambulatory Visit: Payer: Medicare Other | Admitting: Pharmacist

## 2013-09-14 ENCOUNTER — Telehealth: Payer: Self-pay | Admitting: Internal Medicine

## 2013-09-14 NOTE — Telephone Encounter (Signed)
New Problem:  Pt states she has to drive to [facility] on Thursday... Pt has hospital procedure on Wednesday... Pt states will she be able to drive/travel immediately after her procedure or does she need to postpone? Please advise

## 2013-09-14 NOTE — Telephone Encounter (Signed)
Pt is having an ablation tomorrow Wednesday 09/15/13. Pt states she needed to drive to [place] the next day because  her mother  has many cardiac symptoms so pt's mom needs a stress test. Pt states she is the only family member her mother has, and she has to go to be with her . Pt states Dr. Ladona Ridgel tolled her that after the ablation she would need to rest for a list two days, so she would like to cancel the procedure . Pt will call back to rescheduled it. / EP - cath lab was called and spoke with Dennie Bible and cancelled the Ablation.

## 2013-09-15 ENCOUNTER — Ambulatory Visit (HOSPITAL_COMMUNITY): Admission: RE | Admit: 2013-09-15 | Payer: Medicare Other | Source: Ambulatory Visit | Admitting: Internal Medicine

## 2013-09-15 ENCOUNTER — Encounter (HOSPITAL_COMMUNITY): Admission: RE | Payer: Self-pay | Source: Ambulatory Visit

## 2013-09-15 ENCOUNTER — Telehealth: Payer: Self-pay | Admitting: Internal Medicine

## 2013-09-15 DIAGNOSIS — I4892 Unspecified atrial flutter: Secondary | ICD-10-CM

## 2013-09-15 SURGERY — ATRIAL FLUTTER ABLATION
Anesthesia: LOCAL

## 2013-09-15 NOTE — Telephone Encounter (Signed)
New Problem:  Pt states she needs to reschedule her abailtion. 11/17-11/21 or 12/1-12/5... Those are the dates the pt is requesting for her procedure. Pt also wants to know what blood work she needs and when she needs to get it drawn. Please advise

## 2013-09-15 NOTE — Telephone Encounter (Signed)
Patient will rescheduled for 11/20 and get her labs 11/13

## 2013-09-16 ENCOUNTER — Other Ambulatory Visit (INDEPENDENT_AMBULATORY_CARE_PROVIDER_SITE_OTHER): Payer: Medicare Other

## 2013-09-16 ENCOUNTER — Encounter (HOSPITAL_COMMUNITY): Payer: Self-pay | Admitting: Respiratory Therapy

## 2013-09-16 DIAGNOSIS — I4892 Unspecified atrial flutter: Secondary | ICD-10-CM | POA: Diagnosis not present

## 2013-09-16 LAB — CBC WITH DIFFERENTIAL/PLATELET
Basophils Relative: 0.8 % (ref 0.0–3.0)
Eosinophils Relative: 3 % (ref 0.0–5.0)
HCT: 42.8 % (ref 36.0–46.0)
Hemoglobin: 14.6 g/dL (ref 12.0–15.0)
Lymphocytes Relative: 28.8 % (ref 12.0–46.0)
MCHC: 34.2 g/dL (ref 30.0–36.0)
Monocytes Relative: 10 % (ref 3.0–12.0)
Neutro Abs: 3 10*3/uL (ref 1.4–7.7)
RBC: 4.71 Mil/uL (ref 3.87–5.11)
WBC: 5.2 10*3/uL (ref 4.5–10.5)

## 2013-09-16 LAB — BASIC METABOLIC PANEL
CO2: 30 mEq/L (ref 19–32)
Calcium: 9.4 mg/dL (ref 8.4–10.5)
Chloride: 104 mEq/L (ref 96–112)
GFR: 77.45 mL/min (ref >60.00)
Sodium: 139 mEq/L (ref 135–145)

## 2013-09-17 ENCOUNTER — Encounter: Payer: Medicare Other | Admitting: Nurse Practitioner

## 2013-09-17 ENCOUNTER — Ambulatory Visit: Payer: Medicare Other | Admitting: Pharmacist

## 2013-09-20 ENCOUNTER — Telehealth: Payer: Self-pay

## 2013-09-20 ENCOUNTER — Ambulatory Visit: Payer: Medicare Other | Admitting: Pharmacist

## 2013-09-20 ENCOUNTER — Telehealth: Payer: Self-pay | Admitting: Internal Medicine

## 2013-09-20 NOTE — Telephone Encounter (Signed)
New Problem:  Pt wants to know when she needs to arrive for her Cath on Thursday. Please advise pt when to arrive at the hospital.

## 2013-09-20 NOTE — Telephone Encounter (Signed)
The patient called and is hoping to get advice on her insulin pump before her cardiac appt.  Pt's callback number is [phone].  If ov needed, please advise, and i'll be happy to schedule! Thanks!

## 2013-09-20 NOTE — Telephone Encounter (Signed)
Be there at 10am  patietn aware and had her labs on the 13th

## 2013-09-21 ENCOUNTER — Encounter: Payer: Medicare Other | Admitting: Nurse Practitioner

## 2013-09-21 ENCOUNTER — Ambulatory Visit (INDEPENDENT_AMBULATORY_CARE_PROVIDER_SITE_OTHER): Payer: Medicare Other | Admitting: Pharmacist

## 2013-09-21 ENCOUNTER — Encounter: Payer: Self-pay | Admitting: Nurse Practitioner

## 2013-09-21 ENCOUNTER — Telehealth: Payer: Self-pay | Admitting: Pharmacist

## 2013-09-21 DIAGNOSIS — I4891 Unspecified atrial fibrillation: Secondary | ICD-10-CM | POA: Diagnosis not present

## 2013-09-21 NOTE — Telephone Encounter (Signed)
Message copied by Lou Miner on Tue Sep 21, 2013  3:30 PM ------      Message from: Marinus Maw      Created: Tue Sep 21, 2013 12:06 PM       Agree. She should stop plavix. I might place her back on plavix after the ablation if we decide to stop the Xarelto. GT      ----- Message -----         From: Gaspar Skeeters Isidoro Santillana, RPH         Sent: 09/21/2013  11:58 AM           To: Marinus Maw, MD            Patient scheduled to have ablation in two days.  She is currently on plavix 75 mg qd and Xarelto 20 mg qd.  H/o PCI 2009.  She tells me the hope is to come off Xarelto 20 mg soon after ablation assuming it is a success, and therefore remain only on plavix.  If you would like plavix stopped at this time please let me know, otherwise patient will continue both agents moving forward, and re-assess after ablation.       ------

## 2013-09-21 NOTE — Telephone Encounter (Signed)
Patient instructed to d/c plavix and continue Xarelto 20 mg qd.  Med list updated under today's office visit with [facility].

## 2013-09-21 NOTE — Addendum Note (Signed)
Addended by: Lou Miner on: 09/21/2013 03:30 PM   Modules accepted: Orders, Medications

## 2013-09-21 NOTE — Progress Notes (Addendum)
Pt was started on Xarelto 20 mg qd for Afib on 08/15/13 in hospital.   Scheduled for ablation on 09/23/13 by Dr. Ladona Ridgel.     Reviewed patients medication list.  Pt is not currently on any combined P-gp and strong CYP3A4 inhibitors/inducers (ketoconazole, traconazole, ritonavir, carbamazepine, phenytoin, rifampin, St. John's wort).  Reviewed labs.  SCr 0.8 mg/dL, Weight 161 lbs, CrCl- 100 ml/min.  Dose appropriate based on CrCl.   Hgb and HCT Within Normal Limits.  Labs done 09/16/13.  Patient counseled to avoid NSAIDs while on Xarelto, and to use tylenol as needed for her arthritis pain.  Patient on both plavix and Xarelto currently, and will bring to attention of Dr. Ladona Ridgel in case Xarelto is continued long term following ablation later this week.  A full discussion of the nature of anticoagulants has been carried out.  A benefit/risk analysis has been presented to the patient, so that they understand the justification for choosing anticoagulation with Xarelto at this time.  The need for compliance is stressed.  Pt is aware to take the medication once daily with the largest meal of the day.  Side effects of potential bleeding are discussed, including unusual colored urine or stools, coughing up blood or coffee ground emesis, nose bleeds or serious fall or head trauma.  Discussed signs and symptoms of stroke. The patient should avoid any OTC items containing aspirin or ibuprofen.  Avoid alcohol consumption.   Call if any signs of abnormal bleeding.  Discussed financial obligations and resolved any difficulty in obtaining medication.  Next lab test test in 6 months if remains on Xarelto.   -- Discussed with Dr. Ladona Ridgel later on in day on 09/21/13, and decision was made to d/c plavix for now.  Patient notified to stop her plavix, and that this may restart in the future if Xarelto is stopped.  She was agreeable to this.

## 2013-09-21 NOTE — Patient Instructions (Signed)
A full discussion of the nature of anticoagulants has been carried out.  A benefit/risk analysis has been presented to the patient, so that they understand the justification for choosing anticoagulation with Xarelto at this time.  The need for compliance is stressed.  Pt is aware to take the medication once daily with the largest meal of the day.  Side effects of potential bleeding are discussed, including unusual colored urine or stools, coughing up blood or coffee ground emesis, nose bleeds or serious fall or head trauma.  Discussed signs and symptoms of stroke. The patient should avoid any OTC items containing aspirin or ibuprofen.  Avoid alcohol consumption.   Call if any signs of abnormal bleeding.  Discussed financial obligations and resolved any difficulty in obtaining medication.  Next lab test test in 6 months if remains on Xarelto.

## 2013-09-21 NOTE — Progress Notes (Signed)
This encounter was created in error - please disregard.

## 2013-09-23 ENCOUNTER — Encounter (HOSPITAL_COMMUNITY): Payer: Self-pay | Admitting: *Deleted

## 2013-09-23 ENCOUNTER — Ambulatory Visit (HOSPITAL_COMMUNITY)
Admission: RE | Admit: 2013-09-23 | Discharge: 2013-09-24 | Disposition: A | Discharge status: Home / Self Care | Payer: Medicare Other | Source: Ambulatory Visit | Attending: Internal Medicine | Admitting: Internal Medicine

## 2013-09-23 ENCOUNTER — Encounter (HOSPITAL_COMMUNITY)
Admission: RE | Disposition: A | Discharge status: Home / Self Care | Payer: Self-pay | Source: Ambulatory Visit | Attending: Internal Medicine

## 2013-09-23 DIAGNOSIS — I4892 Unspecified atrial flutter: Secondary | ICD-10-CM | POA: Diagnosis not present

## 2013-09-23 DIAGNOSIS — Z9861 Coronary angioplasty status: Secondary | ICD-10-CM | POA: Diagnosis not present

## 2013-09-23 DIAGNOSIS — E785 Hyperlipidemia, unspecified: Secondary | ICD-10-CM

## 2013-09-23 DIAGNOSIS — M899 Disorder of bone, unspecified: Secondary | ICD-10-CM | POA: Diagnosis not present

## 2013-09-23 DIAGNOSIS — D219 Benign neoplasm of connective and other soft tissue, unspecified: Secondary | ICD-10-CM

## 2013-09-23 DIAGNOSIS — Z794 Long term (current) use of insulin: Secondary | ICD-10-CM | POA: Insufficient documentation

## 2013-09-23 DIAGNOSIS — M129 Arthropathy, unspecified: Secondary | ICD-10-CM | POA: Insufficient documentation

## 2013-09-23 DIAGNOSIS — E1065 Type 1 diabetes mellitus with hyperglycemia: Secondary | ICD-10-CM

## 2013-09-23 DIAGNOSIS — I2510 Atherosclerotic heart disease of native coronary artery without angina pectoris: Secondary | ICD-10-CM | POA: Diagnosis not present

## 2013-09-23 DIAGNOSIS — I059 Rheumatic mitral valve disease, unspecified: Secondary | ICD-10-CM | POA: Insufficient documentation

## 2013-09-23 DIAGNOSIS — Z23 Encounter for immunization: Secondary | ICD-10-CM | POA: Insufficient documentation

## 2013-09-23 DIAGNOSIS — Z9641 Presence of insulin pump (external) (internal): Secondary | ICD-10-CM | POA: Insufficient documentation

## 2013-09-23 DIAGNOSIS — F5104 Psychophysiologic insomnia: Secondary | ICD-10-CM

## 2013-09-23 DIAGNOSIS — E109 Type 1 diabetes mellitus without complications: Secondary | ICD-10-CM | POA: Diagnosis not present

## 2013-09-23 DIAGNOSIS — I079 Rheumatic tricuspid valve disease, unspecified: Secondary | ICD-10-CM | POA: Insufficient documentation

## 2013-09-23 DIAGNOSIS — I10 Essential (primary) hypertension: Secondary | ICD-10-CM | POA: Insufficient documentation

## 2013-09-23 DIAGNOSIS — M8580 Other specified disorders of bone density and structure, unspecified site: Secondary | ICD-10-CM

## 2013-09-23 HISTORY — PX: ATRIAL FLUTTER ABLATION: SHX5733

## 2013-09-23 HISTORY — PX: ABLATION: SHX5711

## 2013-09-23 LAB — GLUCOSE, CAPILLARY
Glucose-Capillary: 49 mg/dL — ABNORMAL LOW (ref 70–99)
Glucose-Capillary: 62 mg/dL — ABNORMAL LOW (ref 70–99)
Glucose-Capillary: 210 mg/dL — ABNORMAL HIGH (ref 70–99)
Glucose-Capillary: 100 mg/dL — ABNORMAL HIGH (ref 70–99)
Glucose-Capillary: 74 mg/dL (ref 70–99)
Glucose-Capillary: 86 mg/dL (ref 70–99)
Glucose-Capillary: 114 mg/dL — ABNORMAL HIGH (ref 70–99)

## 2013-09-23 SURGERY — ATRIAL FLUTTER ABLATION
Anesthesia: LOCAL

## 2013-09-23 MED ORDER — SODIUM CHLORIDE 0.9 % IJ SOLN
3.0000 mL | Freq: Two times a day (BID) | INTRAMUSCULAR | Status: DC
  Administered 2013-09-23: 3 mL via INTRAVENOUS

## 2013-09-23 MED ORDER — DEXTROSE 50 % IV SOLN
INTRAVENOUS | Status: AC
  Administered 2013-09-23: 50 mL
  Filled 2013-09-23: qty 50

## 2013-09-23 MED ORDER — GLUCOSE 40 % PO GEL
ORAL | Status: AC
  Administered 2013-09-23: 37.5 g
  Filled 2013-09-23: qty 1

## 2013-09-23 MED ORDER — RIVAROXABAN 20 MG PO TABS
20.0000 mg | ORAL_TABLET | Freq: Every day | ORAL | Status: DC
  Administered 2013-09-23: 20 mg via ORAL
  Filled 2013-09-23 (×2): qty 1

## 2013-09-23 MED ORDER — FENTANYL CITRATE 0.05 MG/ML IJ SOLN
INTRAMUSCULAR | Status: AC
  Filled 2013-09-23: qty 2

## 2013-09-23 MED ORDER — PIOGLITAZONE HCL 15 MG PO TABS
15.0000 mg | ORAL_TABLET | Freq: Every day | ORAL | Status: DC
  Administered 2013-09-24: 15 mg via ORAL
  Filled 2013-09-23 (×2): qty 1

## 2013-09-23 MED ORDER — MIDAZOLAM HCL 5 MG/5ML IJ SOLN
INTRAMUSCULAR | Status: AC
  Filled 2013-09-23: qty 5

## 2013-09-23 MED ORDER — PNEUMOCOCCAL VAC POLYVALENT 25 MCG/0.5ML IJ INJ
0.5000 mL | INJECTION | INTRAMUSCULAR | Status: AC
  Administered 2013-09-24: 0.5 mL via INTRAMUSCULAR
  Filled 2013-09-23: qty 0.5

## 2013-09-23 MED ORDER — METOPROLOL SUCCINATE ER 50 MG PO TB24
50.0000 mg | ORAL_TABLET | Freq: Every day | ORAL | Status: DC
  Administered 2013-09-24: 50 mg via ORAL
  Filled 2013-09-23: qty 1

## 2013-09-23 MED ORDER — GLUCOSE 40 % PO GEL: 1.0000 | ORAL | Status: DC | PRN

## 2013-09-23 MED ORDER — SODIUM CHLORIDE 0.9 % IV SOLN
INTRAVENOUS | Status: DC
  Administered 2013-09-23: 12:00:00 via INTRAVENOUS

## 2013-09-23 MED ORDER — SODIUM CHLORIDE 0.9 % IV SOLN: 250.0000 mL | INTRAVENOUS | Status: DC | PRN

## 2013-09-23 MED ORDER — INSULIN PUMP
Freq: Three times a day (TID) | SUBCUTANEOUS | Status: DC
  Filled 2013-09-23: qty 1

## 2013-09-23 MED ORDER — DEXTROSE 50 % IV SOLN: 50.0000 mL | Freq: Once | INTRAVENOUS | Status: DC | PRN

## 2013-09-23 MED ORDER — DILTIAZEM HCL ER COATED BEADS 120 MG PO CP24
120.0000 mg | ORAL_CAPSULE | Freq: Every day | ORAL | Status: DC
  Filled 2013-09-23: qty 1

## 2013-09-23 MED ORDER — BUPIVACAINE HCL (PF) 0.25 % IJ SOLN
INTRAMUSCULAR | Status: AC
  Filled 2013-09-23: qty 60

## 2013-09-23 MED ORDER — ACETAMINOPHEN 325 MG PO TABS: 650.0000 mg | ORAL_TABLET | ORAL | Status: DC | PRN

## 2013-09-23 MED ORDER — ONDANSETRON HCL 4 MG/2ML IJ SOLN: 4.0000 mg | Freq: Four times a day (QID) | INTRAMUSCULAR | Status: DC | PRN

## 2013-09-23 MED ORDER — INSULIN PUMP
Freq: Three times a day (TID) | SUBCUTANEOUS | Status: DC
  Administered 2013-09-24: 7 via SUBCUTANEOUS
  Filled 2013-09-23: qty 1

## 2013-09-23 MED ORDER — SODIUM CHLORIDE 0.9 % IJ SOLN: 3.0000 mL | INTRAMUSCULAR | Status: DC | PRN

## 2013-09-23 NOTE — H&P (View-Only) (Signed)
    HPI [name] is referred today for atrial flutter. She is a pleasant 67 yo woman with a h/o CAD, s/p PCI, recurrent atrial flutter, s/p TEE guided cardioversion, DM and HTN. The patient feels sob, dizzy and light headed when she is in flutter. She denies syncope. She has had two episodes requiring cardioversion and would "like to get something done". I cannot find any clear cut atrial fibrillation. With her history of CAD, she is not a good candidate for class 1C AA drugs. She would like to get off of her anti-coagulation. Allergies  Allergen Reactions  . Septra [Bactrim] Rash  . Sulfa Antibiotics Rash     Current Outpatient Prescriptions  Medication Sig Dispense Refill  . Azelaic Acid-Cleanser-Lotion (FINACEA PLUS) 15 % KIT Apply 1 application topically at bedtime.       . Brimonidine Tartrate (MIRVASO) 0.33 % GEL Apply 1 application topically daily.       . Calcium Carbonate-Vitamin D (CALCIUM + D PO) Take 600 mg by mouth 2 (two) times daily.       . Cholecalciferol (VITAMIN D PO) Take 1,000 mg by mouth daily.       . clopidogrel (PLAVIX) 75 MG tablet Take 1 tablet (75 mg total) by mouth daily.  30 tablet  0  . diltiazem (CARDIZEM CD) 120 MG 24 hr capsule Take 1 capsule (120 mg total) by mouth daily.  90 capsule  3  . HUMALOG 100 UNIT/ML injection As directed      . Insulin Aspart (INSULIN PUMP) 100 unit/ml SOLN Inject into the skin. Novolog Vial, max 200 units per day with pump      . metoprolol succinate (TOPROL-XL) 100 MG 24 hr tablet Take 1.5 tablet by mouth daily  135 tablet  3  . niacin (NIASPAN) 1000 MG CR tablet Take 1 tablet (1,000 mg total) by mouth at bedtime.  90 tablet  3  . nitroGLYCERIN (NITROSTAT) 0.4 MG SL tablet Place 1 tablet (0.4 mg total) under the tongue every 5 (five) minutes as needed.  25 tablet  11  . pioglitazone (ACTOS) 15 MG tablet Take 1 tablet (15 mg total) by mouth daily.  90 tablet  3  . Rivaroxaban (XARELTO) 20 MG TABS tablet Take 1 tablet (20  mg total) by mouth daily with supper.  30 tablet  6  . rosuvastatin (CRESTOR) 40 MG tablet Take 1 tablet (40 mg total) by mouth daily.  90 tablet  3   No current facility-administered medications for this visit.     Past Medical History  Diagnosis Date  . Coronary artery disease     2009 LAD Promus stent  . Hyperlipidemia   . Atrial fibrillation   . Osteopenia   . Arthritis   . Type 1 diabetes mellitus on insulin therapy   . Fibroid   . Atrial flutter     a. s/p TEE/DCCV 08/16/13 (normal LV function, no LAA thrombus).  . Valvular heart disease     a. Mild MR/TR by TEE 08/2013.    ROS:   All systems reviewed and negative except as noted in the HPI.   Past Surgical History  Procedure Laterality Date  . Coronary angioplasty with stent placement    . Pelvic laparoscopy    . Myomectomy  1977  . Appendectomy    . Cataract extraction    . Tee without cardioversion N/A 08/16/2013    Procedure: TRANSESOPHAGEAL ECHOCARDIOGRAM (TEE);  Surgeon: Philip J [name], MD;  Location:   [facility];  Service: Cardiovascular;  Laterality: N/A;  . Cardioversion N/A 08/16/2013    Procedure: CARDIOVERSION;  Surgeon: Philip J [name], MD;  Location: [facility] ENDOSCOPY;  Service: Cardiovascular;  Laterality: N/A;  spoke with Tom     Family History  Problem Relation Age of Onset  . Atrial fibrillation Mother   . Diabetes Mother   . Hypertension Mother   . CAD Father 37    died age 39  . CAD Brother 30    small vessel disease  . CAD Cousin     paternal  . CAD Paternal Grandfather     early onset     History   Social History  . Marital Status: Married    Spouse Name: N/A    Number of Children: N/A  . Years of Education: N/A   Occupational History  . Not on file.   Social History Main Topics  . Smoking status: Never Smoker   . Smokeless tobacco: Not on file  . Alcohol Use: 1.2 oz/week    2 Glasses of wine per week  . Drug Use: No  . Sexual Activity: Yes    Birth Control/  Protection: Post-menopausal   Other Topics Concern  . Not on file   Social History Narrative  . No narrative on file     BP 138/72  Pulse 60  Ht 5' 9" (1.753 m)  Wt 204 lb (92.534 kg)  BMI 30.11 kg/m2  Physical Exam:  Well appearing 67 yo woman, who looks younger than her stated age, and in NAD HEENT: Unremarkable Neck:  No JVD, no thyromegally Back:  No CVA tenderness Lungs:  Clear with no wheezes, rales, or rhonchi HEART:  Regular rate rhythm, no murmurs, no rubs, no clicks Abd:  soft, positive bowel sounds, no organomegally, no rebound, no guarding Ext:  2 plus pulses, no edema, no cyanosis, no clubbing Skin:  No rashes no nodules Neuro:  CN II through XII intact, motor grossly intact  EKG - normal sinus rhythm  Assess/Plan: 

## 2013-09-23 NOTE — Progress Notes (Signed)
Inpatient Diabetes Program Recommendations  AACE/ADA: New Consensus Statement on Inpatient Glycemic Control (2013)  Target Ranges:  Prepandial:   less than 140 mg/dL      Peak postprandial:   less than 180 mg/dL (1-2 hours)      Critically ill patients:  140 - 180 mg/dL    **Received call from short-stay RN (Crystal) this morning ~11am.  Patient scheduled for ablation today.  Has history of Type 1 diabetes and uses insulin pump at home to control CBGs.  Patient was wearing insulin pump in short stay and planned to continue insulin pump during procedure.  **Asked RN to please tell patient that she may need to temporarily remove her insulin pump if her procedure will be utilizing Xrays of any sort.  Patient will need to disconnect from pump immediately prior to procedure and will then need to reconnect insulin pump immediately post procedure.  Patient should not be off insulin pump for >1 hour.     **Called down to cardiac cath lab at 3pm today.  Spoke with RN caring for patient.  RN told me patient is currently A&O and is wearing her insulin pump and that her latest CBG (at ~2:30pm) was 74 mg/dl.  Patient is post procedure and will be going to 3W32.  **Called RN on 3W who will be receiving patient.  Alerted RN that patient is wearing her insulin pump and that her most recent CBG was 74 mg/dl per cath lab RN.    **Will assess patient's insulin pump settings tomorrow.  RNs can call DM Coordinator on call with any questions.   Will follow. Ambrose Finland RN, MSN, CDE Diabetes Coordinator Inpatient Diabetes Program Team Pager: [phone] (8a-10p)

## 2013-09-23 NOTE — Interval H&P Note (Signed)
History and Physical Interval Note:  09/23/2013 12:55 PM  [name]  has presented today for surgery, with the diagnosis of AFlutter  The various methods of treatment have been discussed with the patient and family. After consideration of risks, benefits and other options for treatment, the patient has consented to  Procedure(s): ATRIAL FLUTTER ABLATION (N/A) as a surgical intervention .  The patient's history has been reviewed, patient examined, no change in status, stable for surgery.  I have reviewed the patient's chart and labs.  Questions were answered to the patient's satisfaction.     Lewayne Bunting ,M.D.

## 2013-09-23 NOTE — Progress Notes (Signed)
Hypoglycemic Event  CBG: 34  Treatment: 1 tube instant glucose and dinner  Symptoms: Sweaty  Follow-up CBG: Time: 1804 CBG Result: 79  Possible Reasons for Event: Inadequate meal intake  Comments/MD notified: [employer] PA notified.  Patient states she placed her insulin pump on decrease dose prior to having her ablation done for 4 hours.  So when the pump transferred back to her regular basal dose, she had not started to eat.  Patient ate dinner and CBG up to 158.  Patient states she feels fine.  Actos held per patient request.  Will continue to monitor.    Colman Cater  Remember to initiate Hypoglycemia Order Set & complete

## 2013-09-23 NOTE — CV Procedure (Signed)
EPS/RFA of atrial flutter performed without immediate complication. W#[id number]

## 2013-09-24 ENCOUNTER — Other Ambulatory Visit: Payer: Self-pay

## 2013-09-24 ENCOUNTER — Encounter (HOSPITAL_COMMUNITY): Payer: Self-pay | Admitting: *Deleted

## 2013-09-24 DIAGNOSIS — E785 Hyperlipidemia, unspecified: Secondary | ICD-10-CM | POA: Diagnosis not present

## 2013-09-24 DIAGNOSIS — I4892 Unspecified atrial flutter: Secondary | ICD-10-CM

## 2013-09-24 DIAGNOSIS — I2510 Atherosclerotic heart disease of native coronary artery without angina pectoris: Secondary | ICD-10-CM | POA: Diagnosis not present

## 2013-09-24 DIAGNOSIS — M899 Disorder of bone, unspecified: Secondary | ICD-10-CM | POA: Diagnosis not present

## 2013-09-24 LAB — GLUCOSE, CAPILLARY: Glucose-Capillary: 60 mg/dL — ABNORMAL LOW (ref 70–99)

## 2013-09-24 NOTE — Op Note (Signed)
[name], [name] NO.:  [id number]  MEDICAL RECORD NO.:  [id number]  LOCATION:  [id number]                        FACILITY:  [facility]  PHYSICIAN:  Doylene Canning. Ladona Ridgel, MD    DATE OF BIRTH:  1946/01/18  DATE OF PROCEDURE:  09/23/2013 DATE OF DISCHARGE:                              OPERATIVE REPORT   PROCEDURE PERFORMED:  Electrophysiologic study and RF catheter ablation of atrial flutter.  INTRODUCTION:  The patient is a very pleasant 67 year old woman with a history of tachy palpitations and documented atrial flutter, requiring cardioversion.  She has now presented for catheter ablation of atrial flutter.  The patient does not desire to be on lifelong Coumadin or anticoagulation if possible.  DESCRIPTION OF PROCEDURE:  After informed consent was obtained, the patient was taken to the Diagnostic EP Lab in a fasting state.  After usual preparation and draping, intravenous fentanyl and midazolam was given for sedation.  A 6-French hexapolar catheter was inserted percutaneously in the right jugular vein and advanced to the coronary sinus.  A 6-French quadripolar catheter was inserted percutaneously in the right femoral vein and advanced to the His bundle region.  After measurement of basic intervals, rapid atrial pacing was carried out from the atrium at a base drive cycle length of 161 milliseconds.  The pacing cycle length was stepwise decreased down to 360 milliseconds where AV Wenckebach was observed.  Next, programed atrial stimulation was carried out from the atrium at a base drive cycle length of 096 milliseconds. The S1-S2 interval was stepwise decreased down to 290 milliseconds where the AV node ERP was observed.  During programmed atrial stimulation, there was no inducible SVT and no AH jumps and no echo beats.  Next, additional atrial pacing was carried out from the coronary sinus at a pacing cycle length of 390 milliseconds and stepwise decreased down to 230  milliseconds resulting in the initiation of atrial flutter.  This was typical counterclockwise tricuspid annular reentrant atrial flutter at a cycle length of 230 milliseconds.  The patient's flutter would terminate spontaneously, but could easily be reinduced.  At this point, the 7-French quadripolar ablation catheter was inserted percutaneously into the right femoral vein and advanced under fluoroscopic guidance into the right atrium.  Mapping of atrial flutter isthmus was then carried out.  The ablation catheter was maneuvered onto the tricuspid valve annulus and a total of 6 RF energy applications were delivered between the tricuspid valve annulus and the inferior vena cava.  This resulted in creation of atrial flutter isthmus block.  Following catheter ablation, rapid atrial pacing was carried out from the atrium demonstrating a stimulus to atrial activation time locally of 170 milliseconds.  This is indicative that there was in fact isthmus block in the atrial flutter isthmus.  The catheters were then removed. Hemostasis was assured and the patient was returned to her room in satisfactory condition.  COMPLICATIONS:  There were no immediate procedure complications.  RESULTS:  A.  Baseline ECG.  Baseline ECG demonstrates sinus rhythm with normal axis and intervals. B.  Based on intervals, sinus node cycle length was 899 milliseconds. The QRS duration was 94 millisecond.  The HV interval was  44 milliseconds, and the AH interval was 68 milliseconds. C.  Rapid ventricular pacing.  Rapid ventricular pacing was carried out from the right ventricle at base cycle length of 600 milliseconds and stepwise decreased down to 410 milliseconds where VA Wenckebach was observed.  During rapid ventricular pacing, the atrial activation sequence appeared to be midline and decremental. D.  Programmed ventricular stimulation.  Programmed ventricular stimulation was carried out from the right ventricle  at base drive cycle length of 161 milliseconds.  The S1-S2 interval stepwise decreased from 440 milliseconds down to 360 millisecond where the retrograde AV node ERP was observed.  During programmed ventricular stimulation, the atrial activation sequence was midline and decremental, but there was no inducible SVT. E.  Rapid atrial pacing.  Rapid atrial pacing was carried out from the atrium at base drive cycle length of 096 milliseconds, stepwise decreased down to 360 milliseconds where AV Wenckebach was observed. During rapid atrial pacing, the PR interval was less than the RR interval and there was no inducible SVT. F.  Programmed atrial stimulation.  Programmed atrial stimulation was carried out from the atrium at a base drive cycle length of 045 milliseconds.  The S1-S2 interval stepwise decreased down to 290 milliseconds where the AV node ERP was observed.  During programmed atrial stimulation, there were no AH jumps, no echo beats, no inducible SVT. G.  Arrhythmias observed. 1. Atrial flutter initiation was with rapid atrial pacing, duration     was nonsustained, termination was spontaneous, cycle length was 290     milliseconds.     a.     Mapping.  Mapping of the atrial flutter isthmus demonstrated      normal size and orientation.     b.     RF energy application.  A total of 6 RF energy applications      were delivered to the atrial flutter isthmus between the tricuspid      valve anulus and inferior vena cava resulting in termination of      atrial flutter, restoration of sinus rhythm, creation of      bidirectional block and atrial flutter isthmus.  CONCLUSION:  The study demonstrates successful electrophysiologic study and RF catheter ablation of typical atrial flutter with a total of 6 RF energy applications delivered to the atrial flutter isthmus.  Following catheter ablation, there was no recurrent atrial flutter isthmus conduction.     Doylene Canning. Ladona Ridgel,  MD     GWT/MEDQ  D:  09/23/2013  T:  09/24/2013  Job:  [id number]

## 2013-09-24 NOTE — Progress Notes (Signed)
Pt in stable condition. Assessment unchanged from this am. D/c'd via wheelchair to private vehicle with husband

## 2013-09-24 NOTE — Discharge Summary (Signed)
ELECTROPHYSIOLOGY PROCEDURE DISCHARGE SUMMARY    Patient ID: [name],  MRN: [id number], DOB/AGE: 11-04-1946 67 y.o.  Admit date: 09/23/2013 Discharge date: 09/24/2013  Primary Care Physician: Rene Paci, MD Primary Cardiologist: [name] Electrophysiologist:  Lewayne Bunting, MD  Primary Discharge Diagnosis:  Atrial flutter status post ablation this admission  Secondary Discharge Diagnosis:  1.  Coronary artery disease - s/p LAD stent in 2009 2.  Hyperlipidemia 3.  Osteopenia 4.  Arthritis 5.  Type I diabetes 6.  Valvular heart disease - mild MR/TR by TEE 08-2013  Allergies  Allergen Reactions  . Septra [Bactrim] Rash  . Sulfa Antibiotics Rash     Procedures This Admission:  1.  Electrophysiology study and radiofrequency catheter ablation of atrial flutter on 09-23-2013 by Dr Ladona Ridgel.  This study demonstrated successful ablation of atrial flutter with complete bidirectional isthmus block achieved.  There were no early apparent complications.   Brief HPI: [name] is a 67 year old female who was referred to electrophysiology in the outpatient setting for treatment options of atrial flutter.  She has failed cardioversion in the past. Risks, benefits,and alternatives to ablation were reviewed with the patient who wished to proceed.    Hospital Course:  The patient was admitted and underwent ablation of atrial flutter with details as outlined above.  She was monitored on telemetry overnight which demonstrated sinus rhythm.  Her groin and neck incisions were without complication.  She was evaluated by Dr Ladona Ridgel and considered stable for discharge to home.  She will follow up in the office in 4 weeks.   Discharge Vitals: Blood pressure 128/54, pulse 62, temperature 97.8 F (36.6 C), temperature source Oral, resp. rate 18, height 5\' 9"  (1.753 m), weight 207 lb (93.895 kg), SpO2 94.00%.    Labs:   Lab Results  Component Value Date   WBC 5.2 09/16/2013   HGB  14.6 09/16/2013   HCT 42.8 09/16/2013   MCV 90.8 09/16/2013   PLT 266.0 09/16/2013     Discharge Medications:    Medication List    ASK your doctor about these medications       CALCIUM + D PO  Take 600 mg by mouth 2 (two) times daily.     diltiazem 120 MG 24 hr capsule  Commonly known as:  CARDIZEM CD  Take 1 capsule (120 mg total) by mouth daily.     FINACEA PLUS 15 % Kit  Apply 1 application topically at bedtime.     insulin pump 100 unit/ml Soln  Inject into the skin. Novolog Vial, max 200 units per day with pump     metoprolol succinate 100 MG 24 hr tablet  Commonly known as:  TOPROL-XL  Take 1.5 tablet by mouth daily     MIRVASO 0.33 % Gel  Generic drug:  Brimonidine Tartrate  Apply 1 application topically daily.     niacin 1000 MG CR tablet  Commonly known as:  NIASPAN  Take 1 tablet (1,000 mg total) by mouth at bedtime.     nitroGLYCERIN 0.4 MG SL tablet  Commonly known as:  NITROSTAT  Place 1 tablet (0.4 mg total) under the tongue every 5 (five) minutes as needed.     pioglitazone 15 MG tablet  Commonly known as:  ACTOS  Take 1 tablet (15 mg total) by mouth daily.     Rivaroxaban 20 MG Tabs tablet  Commonly known as:  XARELTO  Take 1 tablet (20 mg total) by mouth daily with  supper.     rosuvastatin 40 MG tablet  Commonly known as:  CRESTOR  Take 1 tablet (40 mg total) by mouth daily.     VITAMIN D PO  Take 1,000 mg by mouth daily.        Disposition:       Future Appointments Provider Department Dept Phone   09/27/2013 9:30 AM Newt Lukes, MD [facility] [phone]   10/11/2013 9:45 AM [facility] [phone]   10/15/2013 10:00 AM Reather Littler, MD [facility] Endocr[facility]ology [phone]   11/09/2013 12:30 PM Marinus Maw, MD [facility] Office [phone]     Follow-up Information   Follow up with Lewayne Bunting, MD On 11/09/2013. (At 12:30 PM )     Specialty:  Cardiology   Contact information:   [address] [place] [address] [phone]       Duration of Discharge Encounter: Greater than 30 minutes including physician time.  Signed, Leonia Reeves.D.

## 2013-09-27 ENCOUNTER — Encounter: Payer: Self-pay | Admitting: Internal Medicine

## 2013-09-27 ENCOUNTER — Ambulatory Visit (INDEPENDENT_AMBULATORY_CARE_PROVIDER_SITE_OTHER): Payer: Medicare Other | Admitting: Internal Medicine

## 2013-09-27 VITALS — BP 130/80 | HR 69 | Temp 96.9°F | Ht 69.0 in | Wt 201.8 lb

## 2013-09-27 DIAGNOSIS — Z0000 Encounter for general adult medical examination without abnormal findings: Secondary | ICD-10-CM

## 2013-09-27 DIAGNOSIS — I4892 Unspecified atrial flutter: Secondary | ICD-10-CM

## 2013-09-27 DIAGNOSIS — E785 Hyperlipidemia, unspecified: Secondary | ICD-10-CM

## 2013-09-27 DIAGNOSIS — G4700 Insomnia, unspecified: Secondary | ICD-10-CM | POA: Diagnosis not present

## 2013-09-27 DIAGNOSIS — E1065 Type 1 diabetes mellitus with hyperglycemia: Secondary | ICD-10-CM | POA: Diagnosis not present

## 2013-09-27 DIAGNOSIS — F5104 Psychophysiologic insomnia: Secondary | ICD-10-CM

## 2013-09-27 LAB — GLUCOSE, CAPILLARY

## 2013-09-27 MED ORDER — NIACIN ER (ANTIHYPERLIPIDEMIC) 1000 MG PO TBCR: EXTENDED_RELEASE_TABLET | ORAL | Status: DC

## 2013-09-27 NOTE — Assessment & Plan Note (Signed)
No evidence depression/anxiety on exam Reviewed importance of sleep hygiene and habits Pt wishes to avoid "habit forming med and prior OTC meds ineffective or caused side effects  Ineffective trial low dose elavil 03/2012 -does not wish to try alternate medicine this time, but agrees to call if lack of sleep interfering with daytime responsibilities

## 2013-09-27 NOTE — Assessment & Plan Note (Signed)
Follows with endo Lucianne Muss for same The current medical regimen is effective;  continue present plan and medications. Lab Results  Component Value Date   HGBA1C 7.8* 07/12/2013

## 2013-09-27 NOTE — Progress Notes (Signed)
Subjective:    Patient ID: [name], female    DOB: 1946/07/30, 67 y.o.   MRN: [id number]  HPI  Patient here today for annual wellness exam.    Here for medicare wellness  Diet: heart healthy, diabetic Physical activity: sedentary Depression/mood screen: negative Hearing: intact to whispered voice Visual acuity: grossly normal, performs annual eye exam  ADLs: capable Fall risk: none Home safety: good Cognitive evaluation: intact to orientation, naming, recall and repetition EOL planning: adv directives, full code/ I agree  I have personally reviewed and have noted 1. The patient's medical and social history 2. Their use of alcohol, tobacco or illicit drugs 3. Their current medications and supplements 4. The patient's functional ability including ADL's, fall risks, home safety risks and hearing or visual impairment. 5. Diet and physical activities 6. Evidence for depression or mood disorders  Also chronic medical issues also reviewed -  CAD - ischemic event 05/2008 while on treadmill - s/p stent 06/01/08 - follows with cards for same - she reports compliance with medication(s) as prescribed. Denies adverse side effects.   DM2, insulin-dependent - follows with endo for same - on metformin, actos and humalog pump- denies symptoms hypoglycemia   Osteopenia - DEXA previously followed by gyn - last done 2012 - on Ca and Vit D, no bone pain, WB activity   complains of insomnia, chronic, ongoing for years - trouble falling asleep and staying asleep - denies depression or anxiety, no "stress" - admits to "restless dog" in bed with her -ineffective relief with amitriptyline. Declines "habit-forming medication"  Atrial flutter - status post ablation 09-23-2013 by EP Ladona Ridgel)  Past Medical History  Diagnosis Date  . Coronary artery disease     2009 LAD Promus stent  . Hyperlipidemia   . Osteopenia   . Arthritis   . Type 1 diabetes mellitus on insulin therapy   . Fibroid   .  Atrial flutter     a. s/p TEE/DCCV 08/16/13 (normal LV function, no LAA thrombus).b. s/p ablation by Dr Ladona Ridgel 09-23-2013  . Valvular heart disease     a. Mild MR/TR by TEE 08/2013.   Family History  Problem Relation Age of Onset  . Atrial fibrillation Mother   . Diabetes Mother   . Hypertension Mother   . CAD Father 12    died age 3  . CAD Brother 30    small vessel disease  . CAD Cousin     paternal  . CAD Paternal Grandfather     early onset   History  Substance Use Topics  . Smoking status: Never Smoker   . Smokeless tobacco: Not on file  . Alcohol Use: 1.2 oz/week    2 Glasses of wine per week    Review of Systems  Constitutional: Positive for activity change, fatigue and unexpected weight change (gain in past 32mo). Negative for fever and appetite change.  Respiratory: Negative for cough, shortness of breath and wheezing.   Cardiovascular: Negative for chest pain, palpitations and leg swelling.  Gastrointestinal: Negative for nausea, abdominal pain and diarrhea.  Neurological: Negative for dizziness, weakness, light-headedness and headaches.  Psychiatric/Behavioral: Positive for sleep disturbance (chronic). Negative for dysphoric mood. The patient is not nervous/anxious.   All other systems reviewed and are negative.       Objective:   Physical Exam BP 130/80  Pulse 69  Temp(Src) 96.9 F (36.1 C) (Oral)  Ht 5\' 9"  (1.753 m)  Wt 201 lb 12 oz (91.513 kg)  BMI  29.78 kg/m2  Wt Readings from Last 3 Encounters:  09/27/13 201 lb 12 oz (91.513 kg)  09/23/13 207 lb (93.895 kg)  09/23/13 207 lb (93.895 kg)   Constitutional: She appears well-developed and well-nourished. No distress.  HENT: Head: Normocephalic and atraumatic. Ears: B TMs ok, no erythema or effusion; Nose: Nose normal. Mouth/Throat: Oropharynx is clear and moist. No oropharyngeal exudate.  Eyes: Conjunctivae and EOM are normal. Pupils are equal, round, and reactive to light. No scleral icterus.   Neck: Normal range of motion. Neck supple. No JVD present. No thyromegaly present.  Cardiovascular: Normal rate, regular rhythm and normal heart sounds.  No murmur heard. No BLE edema. Pulmonary/Chest: Effort normal and breath sounds normal. No respiratory distress. She has no wheezes.  Abdominal: Soft. Bowel sounds are normal. She exhibits no distension. There is no tenderness. no masses Musculoskeletal: Normal range of motion, no joint effusions. No gross deformities Neurological: She is alert and oriented to person, place, and time. No cranial nerve deficit. Coordination, balance, strength, speech and gait are normal.  Skin: Skin is warm and dry. No rash noted. No erythema.  Psychiatric: She has a normal mood and affect. Her behavior is normal. Judgment and thought content normal.    Lab Results  Component Value Date   WBC 5.2 09/16/2013   HGB 14.6 09/16/2013   HCT 42.8 09/16/2013   PLT 266.0 09/16/2013   GLUCOSE 86 09/16/2013   CHOL 140 07/12/2013   TRIG 64.0 07/12/2013   HDL 57.30 07/12/2013   LDLCALC 70 07/12/2013   ALT 30 08/16/2013   AST 22 08/16/2013   NA 139 09/16/2013   K 4.0 09/16/2013   CL 104 09/16/2013   CREATININE 0.8 09/16/2013   BUN 15 09/16/2013   CO2 30 09/16/2013   TSH 1.684 08/15/2013   INR 1.11 09/23/2013   HGBA1C 7.8* 07/12/2013   MICROALBUR 0.1 05/11/2013        Assessment & Plan:   AWV/CPX/v70.0 - Today patient counseled on age appropriate routine health concerns for screening and prevention, each reviewed and up to date or declined. Immunizations reviewed and up to date or declined. Labs reviewed. Risk factors for depression reviewed and negative. Hearing function and visual acuity are intact. ADLs screened and addressed as needed. Functional ability and level of safety reviewed and appropriate. Education, counseling and referrals performed based on assessed risks today. Patient provided with a copy of personalized plan for preventive services.  Also See  problem list. Medications and labs reviewed today.

## 2013-09-27 NOTE — Patient Instructions (Addendum)
It was good to see you today.  We have reviewed your prior records including labs and tests today  Health Maintenance reviewed - all recommended immunizations and age-appropriate screenings are up-to-date.  Medications reviewed and updated Reduce Niaspan to 1/2 tab until you see Dr Lucianne Muss no other changes recommended at this time.  Work on resuming 30 minutes 5 days/week on elliptical machine for weight management  Continue working with your other specialists as ongoing  Please schedule followup in 12 months for annual exam and labs as needed, call sooner if problems.  Health Maintenance, Female A healthy lifestyle and preventative care can promote health and wellness.  Maintain regular health, dental, and eye exams.  Eat a healthy diet. Foods like vegetables, fruits, whole grains, low-fat dairy products, and lean protein foods contain the nutrients you need without too many calories. Decrease your intake of foods high in solid fats, added sugars, and salt. Get information about a proper diet from your caregiver, if necessary.  Regular physical exercise is one of the most important things you can do for your health. Most adults should get at least 150 minutes of moderate-intensity exercise (any activity that increases your heart rate and causes you to sweat) each week. In addition, most adults need muscle-strengthening exercises on 2 or more days a week.   Maintain a healthy weight. The body mass index (BMI) is a screening tool to identify possible weight problems. It provides an estimate of body fat based on height and weight. Your caregiver can help determine your BMI, and can help you achieve or maintain a healthy weight. For adults 20 years and older:  A BMI below 18.5 is considered underweight.  A BMI of 18.5 to 24.9 is normal.  A BMI of 25 to 29.9 is considered overweight.  A BMI of 30 and above is considered obese.  Maintain normal blood lipids and cholesterol by exercising  and minimizing your intake of saturated fat. Eat a balanced diet with plenty of fruits and vegetables. Blood tests for lipids and cholesterol should begin at age 43 and be repeated every 5 years. If your lipid or cholesterol levels are high, you are over 50, or you are a high risk for heart disease, you may need your cholesterol levels checked more frequently.Ongoing high lipid and cholesterol levels should be treated with medicines if diet and exercise are not effective.  If you smoke, find out from your caregiver how to quit. If you do not use tobacco, do not start.  Lung cancer screening is recommended for adults aged 35 80 years who are at high risk for developing lung cancer because of a history of smoking. Yearly low-dose computed tomography (CT) is recommended for people who have at least a 30-pack-year history of smoking and are a current smoker or have quit within the past 15 years. A pack year of smoking is smoking an average of 1 pack of cigarettes a day for 1 year (for example: 1 pack a day for 30 years or 2 packs a day for 15 years). Yearly screening should continue until the smoker has stopped smoking for at least 15 years. Yearly screening should also be stopped for people who develop a health problem that would prevent them from having lung cancer treatment.  If you are pregnant, do not drink alcohol. If you are breastfeeding, be very cautious about drinking alcohol. If you are not pregnant and choose to drink alcohol, do not exceed 1 drink per day. One drink is considered  to be 12 ounces (355 mL) of beer, 5 ounces (148 mL) of wine, or 1.5 ounces (44 mL) of liquor.  Avoid use of street drugs. Do not share needles with anyone. Ask for help if you need support or instructions about stopping the use of drugs.  High blood pressure causes heart disease and increases the risk of stroke. Blood pressure should be checked at least every 1 to 2 years. Ongoing high blood pressure should be treated  with medicines, if weight loss and exercise are not effective.  If you are 67 to 65 years old, ask your caregiver if you should take aspirin to prevent strokes.  Diabetes screening involves taking a blood sample to check your fasting blood sugar level. This should be done once every 3 years, after age 82, if you are within normal weight and without risk factors for diabetes. Testing should be considered at a younger age or be carried out more frequently if you are overweight and have at least 1 risk factor for diabetes.  Breast cancer screening is essential preventative care for women. You should practice "breast self-awareness." This means understanding the normal appearance and feel of your breasts and may include breast self-examination. Any changes detected, no matter how small, should be reported to a caregiver. Women in their 31s and 30s should have a clinical breast exam (CBE) by a caregiver as part of a regular health exam every 1 to 3 years. After age 8, women should have a CBE every year. Starting at age 92, women should consider having a mammogram (breast X-ray) every year. Women who have a family history of breast cancer should talk to their caregiver about genetic screening. Women at a high risk of breast cancer should talk to their caregiver about having an MRI and a mammogram every year.  Breast cancer gene (BRCA)-related cancer risk assessment is recommended for women who have family members with BRCA-related cancers. BRCA-related cancers include breast, ovarian, tubal, and peritoneal cancers. Having family members with these cancers may be associated with an increased risk for harmful changes (mutations) in the breast cancer genes BRCA1 and BRCA2. Results of the assessment will determine the need for genetic counseling and BRCA1 and BRCA2 testing.  The Pap test is a screening test for cervical cancer. Women should have a Pap test starting at age 52. Between ages 14 and 44, Pap tests  should be repeated every 2 years. Beginning at age 71, you should have a Pap test every 3 years as long as the past 3 Pap tests have been normal. If you had a hysterectomy for a problem that was not cancer or a condition that could lead to cancer, then you no longer need Pap tests. If you are between ages 67 and 15, and you have had normal Pap tests going back 10 years, you no longer need Pap tests. If you have had past treatment for cervical cancer or a condition that could lead to cancer, you need Pap tests and screening for cancer for at least 20 years after your treatment. If Pap tests have been discontinued, risk factors (such as a new sexual partner) need to be reassessed to determine if screening should be resumed. Some women have medical problems that increase the chance of getting cervical cancer. In these cases, your caregiver may recommend more frequent screening and Pap tests.  The human papillomavirus (HPV) test is an additional test that may be used for cervical cancer screening. The HPV test looks for the virus  that can cause the cell changes on the cervix. The cells collected during the Pap test can be tested for HPV. The HPV test could be used to screen women aged 48 years and older, and should be used in women of any age who have unclear Pap test results. After the age of 22, women should have HPV testing at the same frequency as a Pap test.  Colorectal cancer can be detected and often prevented. Most routine colorectal cancer screening begins at the age of 13 and continues through age 71. However, your caregiver may recommend screening at an earlier age if you have risk factors for colon cancer. On a yearly basis, your caregiver may provide home test kits to check for hidden blood in the stool. Use of a small camera at the end of a tube, to directly examine the colon (sigmoidoscopy or colonoscopy), can detect the earliest forms of colorectal cancer. Talk to your caregiver about this at age 34,  when routine screening begins. Direct examination of the colon should be repeated every 5 to 10 years through age 64, unless early forms of pre-cancerous polyps or small growths are found.  Hepatitis C blood testing is recommended for all people born from [birth year] through 1963 and any individual with known risks for hepatitis C.  Practice safe sex. Use condoms and avoid high-risk sexual practices to reduce the spread of sexually transmitted infections (STIs). Sexually active women aged 60 and younger should be checked for Chlamydia, which is a common sexually transmitted infection. Older women with new or multiple partners should also be tested for Chlamydia. Testing for other STIs is recommended if you are sexually active and at increased risk.  Osteoporosis is a disease in which the bones lose minerals and strength with aging. This can result in serious bone fractures. The risk of osteoporosis can be identified using a bone density scan. Women ages 6 and over and women at risk for fractures or osteoporosis should discuss screening with their caregivers. Ask your caregiver whether you should be taking a calcium supplement or vitamin D to reduce the rate of osteoporosis.  Menopause can be associated with physical symptoms and risks. Hormone replacement therapy is available to decrease symptoms and risks. You should talk to your caregiver about whether hormone replacement therapy is right for you.  Use sunscreen. Apply sunscreen liberally and repeatedly throughout the day. You should seek shade when your shadow is shorter than you. Protect yourself by wearing long sleeves, pants, a wide-brimmed hat, and sunglasses year round, whenever you are outdoors.  Notify your caregiver of new moles or changes in moles, especially if there is a change in shape or color. Also notify your caregiver if a mole is larger than the size of a pencil eraser.  Stay current with your immunizations. Document Released: 05/06/2011  Document Revised: 02/15/2013 Document Reviewed: 05/06/2011 [facility] Patient Information 2014 [place], [place].

## 2013-09-27 NOTE — Assessment & Plan Note (Signed)
On niaspan and statin for years Intolerable flushing and itching, esp qhs since DC of ASA (s/p ablation and change to xarelto) Decrease dose now and consider DC of same if ok with endo (as endo rx'd same remotely) since pt also on statin -  pt will review with Lucianne Muss at upcoming OV

## 2013-09-27 NOTE — Progress Notes (Signed)
Pre visit review using our clinic review tool, if applicable. No additional management support is needed unless otherwise documented below in the visit note. 

## 2013-09-27 NOTE — Assessment & Plan Note (Signed)
Recurrent and symptomatic S/p ablation with EP 09/2013 for same - doing well Continue xarelto and follow up cards/EP as planned

## 2013-10-11 ENCOUNTER — Other Ambulatory Visit (INDEPENDENT_AMBULATORY_CARE_PROVIDER_SITE_OTHER): Payer: Medicare Other

## 2013-10-11 DIAGNOSIS — E1065 Type 1 diabetes mellitus with hyperglycemia: Secondary | ICD-10-CM | POA: Diagnosis not present

## 2013-10-11 DIAGNOSIS — IMO0002 Reserved for concepts with insufficient information to code with codable children: Secondary | ICD-10-CM

## 2013-10-11 LAB — COMPREHENSIVE METABOLIC PANEL
ALT: 35 U/L (ref 0–35)
Albumin: 3.8 g/dL (ref 3.5–5.2)
Alkaline Phosphatase: 92 U/L (ref 39–117)
BUN: 13 mg/dL (ref 6–23)
CO2: 26 mEq/L (ref 19–32)
Creatinine, Ser: 0.7 mg/dL (ref 0.4–1.2)
GFR: 84.82 mL/min (ref >60.00)
Glucose, Bld: 204 mg/dL — ABNORMAL HIGH (ref 70–99)
Potassium: 4.1 mEq/L (ref 3.5–5.1)
Sodium: 138 mEq/L (ref 135–145)
Total Bilirubin: 0.5 mg/dL (ref 0.3–1.2)
Total Protein: 7.2 g/dL (ref 6.0–8.3)

## 2013-10-11 LAB — HEMOGLOBIN A1C: Hgb A1c MFr Bld: 7.2 % — ABNORMAL HIGH (ref 4.6–6.5)

## 2013-10-15 ENCOUNTER — Ambulatory Visit (INDEPENDENT_AMBULATORY_CARE_PROVIDER_SITE_OTHER): Payer: Medicare Other | Admitting: Endocrinology

## 2013-10-15 ENCOUNTER — Encounter: Payer: Self-pay | Admitting: Endocrinology

## 2013-10-15 VITALS — BP 128/64 | HR 67 | Temp 97.9°F | Resp 12 | Ht 69.0 in | Wt 204.4 lb

## 2013-10-15 DIAGNOSIS — E1065 Type 1 diabetes mellitus with hyperglycemia: Secondary | ICD-10-CM

## 2013-10-15 DIAGNOSIS — M899 Disorder of bone, unspecified: Secondary | ICD-10-CM

## 2013-10-15 DIAGNOSIS — E785 Hyperlipidemia, unspecified: Secondary | ICD-10-CM | POA: Diagnosis not present

## 2013-10-15 DIAGNOSIS — IMO0002 Reserved for concepts with insufficient information to code with codable children: Secondary | ICD-10-CM

## 2013-10-15 DIAGNOSIS — M8580 Other specified disorders of bone density and structure, unspecified site: Secondary | ICD-10-CM

## 2013-10-15 NOTE — Progress Notes (Signed)
Patient ID: [name], female   DOB: 06-30-1946, 67 y.o.   MRN: [id number]  Reason for visit: DIABETES followup.   HISTORY of present illness:  Diagnosis: Type 1 diabetes mellitus, date of diagnosis: 1979.   Insulin Pump: CURRENT brand:  Ping The pump settings are Basal rate: Midnight = 1.7. 2 AM = 1.4; 5 a.m. = 1.9; 11 AM = 1.6. 5 p.m. = 2.7. 11 p.m. = 2.0 Carbohydrate ratio 1:9 lunch and 1:6 at dinner; sensitivity 1:30, 25 after 5 p.m.; target 105. Insulin on board duration 4 hrs, maximum total daily dose 200 units    DIABETES: She has had long-standing diabetes with A1c levels usually above target. She had relatively high readings in 2013. In 10/13 ACTOS was restarted because of her large insulin requirement and diabetic dyslipidemia. Her blood sugars improved significantly and basal rates were reduced. Her best A1c was 6.9.   RECENT history:  Blood sugars are still somewhat erratic although her A1c has come down from the last time. Probably having more hypoglycemia than last time Does not usually change her own basal rates COMPLIANCE with boluses has been fairly good although rarely will still forget. She is again not entering carbohydrates in the pump for her meals and not sure if she is following the pump settings for carb ratios Problems identified:   Fairly frequent hypoglycemia  in the afternoons between about 3-5 PM which is either within 3 hours of her lunch bolus or before supper  Tendency to high readings late morning, mostly over 180 despite not eating breakfast most of the time  Periodic high readings late at night after 9-10 PM, occasionally after eating dessert, highest reading 339 and average reading after 8 PM is over 200  Not checking blood sugars consistently everyday although overall 2.9 times a day   Not using the bolus wizard consistently at meal times and generally only once a day  Monitors blood glucose: on an average 2.9 times a day according to pump  download.  Blood Glucose readings:   PREMEAL Breakfast Lunch Dinner Bedtime Overall  Glucose range:  57-194   64-121   50-320     Mean/median:  130      152   POST-MEAL PC Breakfast PC Lunch PC Dinner  Glucose range:   41-69 192-258   Mean/median:   225   39 % of readings above target and 22 % below, both worse parameters than her last visit  Hypoglycemic awareness: Has symptoms of feeling tired, headache, sweaty., She has symptoms when blood glucose is less than 60. Uses glucose tablets for treatment when not at home but otherwise eats a snack .   Meals: 3 meals per day.  DIET has been relatively inconsistent overall and her mealtimes are variable. Frequently will not eat breakfast.  Usually trying to have low fat meals  Food preferences: Yogurt And cottage cheese for breakfast usually. Marland Kitchen  Physical activity: exercise: Very little recently. Historically after exercise blood sugar will get lower in 1-2 hrs. .  Certified Diabetes Educator visit: Most recent: 8/12.   She is usually regular with her eye exams  LABS:  Lab Results  Component Value Date   HGBA1C 7.2* 10/11/2013   HGBA1C 7.8* 07/12/2013   Lab Results  Component Value Date   MICROALBUR 0.1 05/11/2013   LDLCALC 70 07/12/2013   CREATININE 0.7 10/11/2013    Appointment on 10/11/2013  Component Date Value Range Status  . Sodium 10/11/2013 138  135 - 145  mEq/L Final  . Potassium 10/11/2013 4.1  3.5 - 5.1 mEq/L Final  . Chloride 10/11/2013 107  96 - 112 mEq/L Final  . CO2 10/11/2013 26  19 - 32 mEq/L Final  . Glucose, Bld 10/11/2013 204* 70 - 99 mg/dL Final  . BUN 09/81/1914 13  6 - 23 mg/dL Final  . Creatinine, Ser 10/11/2013 0.7  0.4 - 1.2 mg/dL Final  . Total Bilirubin 10/11/2013 0.5  0.3 - 1.2 mg/dL Final  . Alkaline Phosphatase 10/11/2013 92  39 - 117 U/L Final  . AST 10/11/2013 26  0 - 37 U/L Final  . ALT 10/11/2013 35  0 - 35 U/L Final  . Total Protein 10/11/2013 7.2  6.0 - 8.3 g/dL Final  . Albumin 78/29/5621 3.8   3.5 - 5.2 g/dL Final  . Calcium 30/86/5784 9.0  8.4 - 10.5 mg/dL Final  . GFR 69/62/9528 84.82  >60.00 mL/min Final  . Hemoglobin A1C 10/11/2013 7.2* 4.6 - 6.5 % Final   Glycemic Control Guidelines for People with Diabetes:Non Diabetic:  <6%Goal of Therapy: <7%Additional Action Suggested:  >8%       Medication List       This list is accurate as of: 10/15/13 10:21 AM.  Always use your most recent med list.               CALCIUM + D PO  Take 600 mg by mouth 2 (two) times daily.     CARTIA XT 120 MG 24 hr capsule  Generic drug:  diltiazem     FINACEA PLUS 15 % Kit  Apply 1 application topically at bedtime.     insulin pump 100 unit/ml Soln  Inject into the skin. Novolog Vial, max 200 units per day with pump     metoprolol succinate 100 MG 24 hr tablet  Commonly known as:  TOPROL-XL  Take 1.5 tablet by mouth daily     MIRVASO 0.33 % Gel  Generic drug:  Brimonidine Tartrate  Apply 1 application topically daily.     niacin 1000 MG CR tablet  Commonly known as:  NIASPAN  1/2 tab daily until you see Dr Lucianne Muss     nitroGLYCERIN 0.4 MG SL tablet  Commonly known as:  NITROSTAT  Place 1 tablet (0.4 mg total) under the tongue every 5 (five) minutes as needed.     pioglitazone 15 MG tablet  Commonly known as:  ACTOS  Take 1 tablet (15 mg total) by mouth daily.     Rivaroxaban 20 MG Tabs tablet  Commonly known as:  XARELTO  Take 1 tablet (20 mg total) by mouth daily with supper.     rosuvastatin 40 MG tablet  Commonly known as:  CRESTOR  Take 1 tablet (40 mg total) by mouth daily.     VITAMIN D PO  Take 1,000 mg by mouth daily.        Allergies:  Allergies  Allergen Reactions  . Septra [Bactrim] Rash  . Sulfa Antibiotics Rash    Past Medical History  Diagnosis Date  . Coronary artery disease     2009 LAD Promus stent  . Hyperlipidemia   . Osteopenia   . Arthritis   . Type 1 diabetes mellitus on insulin therapy   . Fibroid   . Atrial flutter     a. s/p  TEE/DCCV 08/16/13 (normal LV function, no LAA thrombus).b. s/p ablation by Dr Ladona Ridgel 09-23-2013  . Valvular heart disease     a. Mild MR/TR by  TEE 08/2013.    Past Surgical History  Procedure Laterality Date  . Coronary angioplasty with stent placement    . Pelvic laparoscopy    . Myomectomy  1977  . Appendectomy    . Cataract extraction    . Tee without cardioversion N/A 08/16/2013    Procedure: TRANSESOPHAGEAL ECHOCARDIOGRAM (TEE);  Surgeon: Vesta Mixer, MD;  Location: [facility] ENDOSCOPY;  Service: Cardiovascular;  Laterality: N/A;  . Cardioversion N/A 08/16/2013    Procedure: CARDIOVERSION;  Surgeon: Vesta Mixer, MD;  Location[facility] ENDOSCOPY;  Service: Cardiovascular;  Laterality: N/A;  spoke with Elijah Birk  . Ablation  09-23-2013    CTI by Dr Ladona Ridgel    Family History  Problem Relation Age of Onset  . Atrial fibrillation Mother   . Diabetes Mother   . Hypertension Mother   . CAD Father 70    died age 51  . CAD Brother 30    small vessel disease  . CAD Cousin     paternal  . CAD Paternal Grandfather     early onset    Social History:  reports that she has never smoked. She does not have any smokeless tobacco history on file. She reports that she drinks about 1.2 ounces of alcohol per week. She reports that she does not use illicit drugs.  REVIEW of systems:  She has a history of coronary disease but no recent chest pain  She has had diabetic dyslipidemia with high LDL particle number and this has been managed with Crestor and Niaspan In 9/14 her lipids were about the best they have been in a while with LDL 70, particle number near 1000 and particle size normal  Lab Results  Component Value Date   CHOL 140 07/12/2013   HDL 57.30 07/12/2013   LDLCALC 70 07/12/2013   TRIG 64.0 07/12/2013   CHOLHDL 2 07/12/2013   No significant hypertension and has had atrial arrhythmias treated with metoprolol and diltiazem She had atrial fibrillation done  Has history of  arthritis   ASSESSMENT:  DIABETES type 1 with fair control, A1c is relatively  better at 7.2  See history of present illness for detailed discussion of her current blood sugar patterns and management with her pump   Problems identified:   Fairly frequent hypoglycemia  in the afternoons between about 3-5 PM which is either within 3 hours of her lunch bolus or before supper  Tendency to high readings late morning, mostly over 180 despite not eating breakfast most of the time  Periodic high readings late at night after 9-10 PM, occasionally after eating dessert, highest reading 339 and average reading after 8 PM is over 200  Not checking blood sugars consistently everyday although overall 2.9 times a day   Not using the bolus wizard consistently at meal times and generally only once a day  Plan:  Encouraged her to check her blood sugar more often on a daily basis Needs to check readings about 2 hours after  meals more often  to help adjust boluses and carbohydrate ratio  To enter her carbohydrates in the pump at every meal  For better calculation of boluses. She can generally reduce her lunchtime boluses by one unit to avoid hypoglycemia with carbohydrate ratio at least 1:10  Use temporary basal rates if planning to be exercising, most likely she will need to reduce basal rate by around 50% for a couple of hours after the exercise Call if having excessive hypoglycemia at consistent times BASAL rate  to be changed as follows: 5 AM = 1.9, 9 AM = 2.0, 12 noon = 1.4 and 5 PM = 2.9  HYPERLIPIDEMIA: followup on next visit   Counseling time over 50% of today's 25 minute visit

## 2013-11-09 ENCOUNTER — Ambulatory Visit (INDEPENDENT_AMBULATORY_CARE_PROVIDER_SITE_OTHER): Payer: Medicare Other | Admitting: Internal Medicine

## 2013-11-09 ENCOUNTER — Encounter: Payer: Self-pay | Admitting: Internal Medicine

## 2013-11-09 VITALS — BP 135/74 | HR 75 | Ht 69.5 in | Wt 208.8 lb

## 2013-11-09 DIAGNOSIS — I4892 Unspecified atrial flutter: Secondary | ICD-10-CM

## 2013-11-09 DIAGNOSIS — I2510 Atherosclerotic heart disease of native coronary artery without angina pectoris: Secondary | ICD-10-CM | POA: Diagnosis not present

## 2013-11-09 DIAGNOSIS — E785 Hyperlipidemia, unspecified: Secondary | ICD-10-CM | POA: Diagnosis not present

## 2013-11-09 NOTE — Assessment & Plan Note (Signed)
The patient is on both statin therapy and Niaspan. She has had problems with flushing. I've encouraged the patient to speak to her primary physician and endocrinologist regarding the benefits of continuing Niaspan versus discontinuation of this medication.

## 2013-11-09 NOTE — Patient Instructions (Signed)
Your physician recommends that you schedule a follow-up appointment as needed  

## 2013-11-09 NOTE — Progress Notes (Signed)
HPI [name] returns today for followup. She is a very pleasant 68 year old woman with history of hypertension, who developed atrial flutter and underwent catheter ablation of atrial flutter several weeks ago. She has done well in the interim. She denies chest pain, shortness of breath, topics patient's, or syncope. Her only complaint today is flushing from her Niaspan. Allergies  Allergen Reactions  . Septra [Bactrim] Rash  . Sulfa Antibiotics Rash     Current Outpatient Prescriptions  Medication Sig Dispense Refill  . Azelaic Acid-Cleanser-Lotion (FINACEA PLUS) 15 % KIT Apply 1 application topically at bedtime.       . Brimonidine Tartrate (MIRVASO) 0.33 % GEL Apply 1 application topically daily.       . Calcium Carbonate-Vitamin D (CALCIUM + D PO) Take 600 mg by mouth 2 (two) times daily.       . Cholecalciferol (VITAMIN D PO) Take 1,000 mg by mouth daily.       . Insulin Aspart (INSULIN PUMP) 100 unit/ml SOLN Inject into the skin. Novolog Vial, max 200 units per day with pump      . metoprolol succinate (TOPROL-XL) 100 MG 24 hr tablet Take 1.5 tablet by mouth daily  135 tablet  3  . niacin (NIASPAN) 1000 MG CR tablet Take 1,000 mg by mouth at bedtime.      . nitroGLYCERIN (NITROSTAT) 0.4 MG SL tablet Place 1 tablet (0.4 mg total) under the tongue every 5 (five) minutes as needed.  25 tablet  11  . pioglitazone (ACTOS) 15 MG tablet Take 1 tablet (15 mg total) by mouth daily.  90 tablet  3  . Rivaroxaban (XARELTO) 20 MG TABS tablet Take 1 tablet (20 mg total) by mouth daily with supper.  30 tablet  6  . rosuvastatin (CRESTOR) 40 MG tablet Take 1 tablet (40 mg total) by mouth daily.  90 tablet  3   No current facility-administered medications for this visit.     Past Medical History  Diagnosis Date  . Coronary artery disease     2009 LAD Promus stent  . Hyperlipidemia   . Osteopenia   . Arthritis   . Type 1 diabetes mellitus on insulin therapy   . Fibroid   . Atrial  flutter     a. s/p TEE/DCCV 08/16/13 (normal LV function, no LAA thrombus).b. s/p ablation by Dr Lovena Le 09-23-2013  . Valvular heart disease     a. Mild MR/TR by TEE 08/2013.    ROS:   All systems reviewed and negative except as noted in the HPI.   Past Surgical History  Procedure Laterality Date  . Coronary angioplasty with stent placement    . Pelvic laparoscopy    . Myomectomy  1977  . Appendectomy    . Cataract extraction    . Tee without cardioversion N/A 08/16/2013    Procedure: TRANSESOPHAGEAL ECHOCARDIOGRAM (TEE);  Surgeon: Thayer Headings, MD;  Location: [place];  Service: Cardiovascular;  Laterality: N/A;  . Cardioversion N/A 08/16/2013    Procedure: CARDIOVERSION;  Surgeon: Thayer Headings, MD;  Location: [place];  Service: Cardiovascular;  Laterality: N/A;  spoke with [name]  . Ablation  09-23-2013    CTI by Dr Lovena Le     Family History  Problem Relation Age of Onset  . Atrial fibrillation Mother   . Diabetes Mother   . Hypertension Mother   . CAD Father 66    died age 27  . CAD Brother 52  small vessel disease  . CAD Cousin     paternal  . CAD Paternal Grandfather     early onset     History   Social History  . Marital Status: Married    Spouse Name: N/A    Number of Children: N/A  . Years of Education: N/A   Occupational History  . Not on file.   Social History Main Topics  . Smoking status: Never Smoker   . Smokeless tobacco: Not on file  . Alcohol Use: 1.2 oz/week    2 Glasses of wine per week  . Drug Use: No  . Sexual Activity: Yes    Birth Control/ Protection: Post-menopausal   Other Topics Concern  . Not on file   Social History Narrative  . No narrative on file     BP 135/74  Pulse 75  Ht 5' 9.5" (1.765 m)  Wt 208 lb 12.8 oz (94.711 kg)  BMI 30.40 kg/m2  Physical Exam:  Well appearing 68 year old woman, NAD HEENT: Unremarkable Neck:  No JVD, no thyromegally Back:  No CVA tenderness Lungs:  Clear with no  wheezes, rales, or rhonchi. HEART:  Regular rate rhythm, no murmurs, no rubs, no clicks Abd:  soft, positive bowel sounds, no organomegally, no rebound, no guarding Ext:  2 plus pulses, no edema, no cyanosis, no clubbing Skin:  No rashes no nodules Neuro:  CN II through XII intact, motor grossly intact  EKG - normal sinus rhythm  Assess/Plan:

## 2013-11-09 NOTE — Assessment & Plan Note (Signed)
She is doing well status post catheter ablation and maintaing sinus rhythm. She will stop her systemic anticoagulation. I will defer initiation of aspirin therapy to her primary physician's. She will call as as needed if she develops any recurrent atrial arrhythmias. I've encouraged the patient to moderate her caffeine intake.

## 2013-11-09 NOTE — Assessment & Plan Note (Signed)
She denies anginal symptoms. We'll follow.

## 2013-11-10 ENCOUNTER — Telehealth: Payer: Self-pay | Admitting: Endocrinology

## 2013-11-10 NOTE — Telephone Encounter (Signed)
Per Dr. Ronnie Derby order, patient was called the change her I/C ratio at supper.  It is now 1u/6gm.  She was told to decrease this ratio to 1u/5, and if HS readings are still high (over 160), change it to 1u/4gm  She reported good understanding of this.

## 2013-11-10 NOTE — Telephone Encounter (Deleted)
Per Dr. Ronnie Derby request, patient was called to tell her to change her I/C ratio at supper from

## 2013-12-06 ENCOUNTER — Other Ambulatory Visit: Payer: Self-pay | Admitting: *Deleted

## 2013-12-06 ENCOUNTER — Telehealth: Payer: Self-pay | Admitting: *Deleted

## 2013-12-06 MED ORDER — PIOGLITAZONE HCL 15 MG PO TABS: 15.0000 mg | ORAL_TABLET | Freq: Every day | ORAL | Status: DC

## 2013-12-06 NOTE — Telephone Encounter (Signed)
Phoned inquiring if she should continue taking her prescribed cardizem secondary to her heart ablation.  Upon reviewing patient's chart, it was discovered patient has a cardiologist, to whom I referred her to for advice on her cardizem.  Pt verbalized understanding & appreciation.

## 2013-12-29 ENCOUNTER — Telehealth: Payer: Self-pay | Admitting: Cardiovascular Disease

## 2013-12-29 ENCOUNTER — Telehealth: Payer: Self-pay | Admitting: *Deleted

## 2013-12-29 NOTE — Telephone Encounter (Signed)
Patient had an ablation done with Dr. Lovena Le, she was on xaralto after that, but she is now back on generic plavix, she wants to make sure that it's okay for her to take that?

## 2013-12-29 NOTE — Telephone Encounter (Signed)
yes

## 2013-12-29 NOTE — Telephone Encounter (Signed)
lmtcb

## 2013-12-29 NOTE — Telephone Encounter (Signed)
New problem    Pt has questions about med DILTIAZEM ERC cartiaxt 120 mg.  Pt would like to know if she should take this med again give her a call please.

## 2013-12-29 NOTE — Telephone Encounter (Signed)
Noted patient is aware 

## 2013-12-31 NOTE — Telephone Encounter (Signed)
Pt was wondering if since she was told to stop xarelto if she should restart diltiazem. I explained the differences between the drugs, Pt is not having HTN issues nor rhythm/ rate problems. Pt was just curious and now understands she does not need to be on med at this time.

## 2013-12-31 NOTE — Telephone Encounter (Signed)
Follow up  ° ° ° °Returning call back to nurse  °

## 2014-01-13 ENCOUNTER — Other Ambulatory Visit (INDEPENDENT_AMBULATORY_CARE_PROVIDER_SITE_OTHER): Payer: Medicare Other

## 2014-01-13 DIAGNOSIS — M8580 Other specified disorders of bone density and structure, unspecified site: Secondary | ICD-10-CM

## 2014-01-13 DIAGNOSIS — IMO0002 Reserved for concepts with insufficient information to code with codable children: Secondary | ICD-10-CM

## 2014-01-13 DIAGNOSIS — E1065 Type 1 diabetes mellitus with hyperglycemia: Secondary | ICD-10-CM

## 2014-01-13 DIAGNOSIS — E785 Hyperlipidemia, unspecified: Secondary | ICD-10-CM | POA: Diagnosis not present

## 2014-01-13 DIAGNOSIS — M949 Disorder of cartilage, unspecified: Secondary | ICD-10-CM | POA: Diagnosis not present

## 2014-01-13 DIAGNOSIS — M899 Disorder of bone, unspecified: Secondary | ICD-10-CM | POA: Diagnosis not present

## 2014-01-13 LAB — URINALYSIS, ROUTINE W REFLEX MICROSCOPIC
BILIRUBIN URINE: NEGATIVE
KETONES UR: NEGATIVE
NITRITE: NEGATIVE
PH: 5.5 (ref 5.0–8.0)
Specific Gravity, Urine: 1.02 (ref 1.000–1.030)
TOTAL PROTEIN, URINE-UPE24: NEGATIVE
URINE GLUCOSE: 250 — AB
Urobilinogen, UA: 0.2 (ref 0.0–1.0)

## 2014-01-13 LAB — MICROALBUMIN / CREATININE URINE RATIO
Creatinine,U: 98.2 mg/dL
MICROALB UR: 0.1 mg/dL (ref 0.0–1.9)
Microalb Creat Ratio: 0.1 mg/g (ref 0.0–30.0)

## 2014-01-13 LAB — BASIC METABOLIC PANEL
BUN: 12 mg/dL (ref 6–23)
CHLORIDE: 107 meq/L (ref 96–112)
CO2: 28 mEq/L (ref 19–32)
Calcium: 9.7 mg/dL (ref 8.4–10.5)
Creatinine, Ser: 0.8 mg/dL (ref 0.4–1.2)
GFR: 76.26 mL/min (ref >60.00)
Glucose, Bld: 92 mg/dL (ref 70–99)
POTASSIUM: 3.9 meq/L (ref 3.5–5.1)
SODIUM: 141 meq/L (ref 135–145)

## 2014-01-13 LAB — LIPID PANEL
Cholesterol: 126 mg/dL (ref 0–200)
HDL: 58.4 mg/dL (ref >39.00)
LDL CALC: 58 mg/dL (ref 0–99)
TRIGLYCERIDES: 50 mg/dL (ref 0.0–149.0)
Total CHOL/HDL Ratio: 2
VLDL: 10 mg/dL (ref 0.0–40.0)

## 2014-01-13 LAB — HEMOGLOBIN A1C: HEMOGLOBIN A1C: 7.2 % — AB (ref 4.6–6.5)

## 2014-01-14 ENCOUNTER — Telehealth: Payer: Self-pay | Admitting: Endocrinology

## 2014-01-14 ENCOUNTER — Other Ambulatory Visit: Payer: Self-pay | Admitting: *Deleted

## 2014-01-14 LAB — VITAMIN D 25 HYDROXY (VIT D DEFICIENCY, FRACTURES): VIT D 25 HYDROXY: 64 ng/mL (ref 30–89)

## 2014-01-14 MED ORDER — ROSUVASTATIN CALCIUM 40 MG PO TABS: 40.0000 mg | ORAL_TABLET | Freq: Every day | ORAL | Status: DC

## 2014-01-14 MED ORDER — CLOPIDOGREL BISULFATE 75 MG PO TABS: 75.0000 mg | ORAL_TABLET | Freq: Every day | ORAL | Status: DC

## 2014-01-14 MED ORDER — INSULIN LISPRO 100 UNIT/ML ~~LOC~~ SOLN: SUBCUTANEOUS | Status: DC

## 2014-01-14 MED ORDER — METFORMIN HCL ER 500 MG PO TB24: ORAL_TABLET | ORAL | Status: DC

## 2014-01-14 MED ORDER — PIOGLITAZONE HCL 15 MG PO TABS: 15.0000 mg | ORAL_TABLET | Freq: Every day | ORAL | Status: DC

## 2014-01-14 NOTE — Telephone Encounter (Signed)
Spoke with patient, she needed 4 rx's called in, they were called into [facility]

## 2014-01-14 NOTE — Telephone Encounter (Signed)
Pt needs to speak with nurse regarding new Rx   Call back: [phone]  Thank you

## 2014-01-17 ENCOUNTER — Telehealth: Payer: Self-pay | Admitting: Endocrinology

## 2014-01-17 ENCOUNTER — Ambulatory Visit (INDEPENDENT_AMBULATORY_CARE_PROVIDER_SITE_OTHER): Payer: Medicare Other | Admitting: Endocrinology

## 2014-01-17 ENCOUNTER — Encounter: Payer: Self-pay | Admitting: Endocrinology

## 2014-01-17 VITALS — BP 132/82 | HR 83 | Temp 97.8°F | Resp 16 | Ht 69.0 in | Wt 208.2 lb

## 2014-01-17 DIAGNOSIS — Z961 Presence of intraocular lens: Secondary | ICD-10-CM | POA: Diagnosis not present

## 2014-01-17 DIAGNOSIS — IMO0002 Reserved for concepts with insufficient information to code with codable children: Secondary | ICD-10-CM | POA: Diagnosis not present

## 2014-01-17 DIAGNOSIS — Z9849 Cataract extraction status, unspecified eye: Secondary | ICD-10-CM | POA: Diagnosis not present

## 2014-01-17 DIAGNOSIS — I2510 Atherosclerotic heart disease of native coronary artery without angina pectoris: Secondary | ICD-10-CM

## 2014-01-17 DIAGNOSIS — E1065 Type 1 diabetes mellitus with hyperglycemia: Secondary | ICD-10-CM

## 2014-01-17 DIAGNOSIS — E782 Mixed hyperlipidemia: Secondary | ICD-10-CM | POA: Diagnosis not present

## 2014-01-17 DIAGNOSIS — H5200 Hypermetropia, unspecified eye: Secondary | ICD-10-CM | POA: Diagnosis not present

## 2014-01-17 DIAGNOSIS — H52229 Regular astigmatism, unspecified eye: Secondary | ICD-10-CM | POA: Diagnosis not present

## 2014-01-17 MED ORDER — METFORMIN HCL ER 500 MG PO TB24: ORAL_TABLET | ORAL | Status: DC

## 2014-01-17 NOTE — Patient Instructions (Addendum)
Midnight = 1.7. 2 AM = 1.3;5 AM = 1.8, 9 AM = 2.0, 12 noon = 1.3 and 5 PM = 2.7 11 p.m. = 2.0   More readings at bedtime, below 120: have a protein snack  Metformin ER, 2 in am  Call if sugar keeps getting low

## 2014-01-17 NOTE — Telephone Encounter (Signed)
Left message on her answering machine to change her target blood sugar reading to 120 per Dr. Ronnie Derby order.  She was told when to go on her pump to find this, but if she could not do this, she will call me back.  Phone number given

## 2014-01-17 NOTE — Progress Notes (Signed)
Patient ID: [name], female   DOB: Mar 02, 1946, 68 y.o.   MRN: [id number]   Reason for visit: DIABETES followup.   HISTORY of present illness:  Diagnosis: Type 1 diabetes mellitus, date of diagnosis: 1979.   Insulin Pump: CURRENT brand:  Ping The pump settings are Basal rate: Midnight = 1.7. 2 AM = 1.4;5 AM = 1.9, 9 AM = 2.0, 12 noon = 1.5 and 5 PM = 2.9   Carbohydrate ratio 1:9 lunch and 1:5 at dinner; sensitivity 1:30, 25 after 5 p.m.; target 105.  Insulin on board indicator on, duration 4 hrs,   DIABETES: She has had long-standing diabetes with A1c levels usually above target. She had relatively high readings in 2013.  In 10/13 ACTOS was restarted because of her large insulin requirement and diabetic dyslipidemia.  Her blood sugars improved significantly and basal rates were reduced. Her best A1c was 6.9.   RECENT history:  On her last visit she was advised to check blood sugars more often which she is doing including some readings after meals which she is not always doing especially after supper; also is not trying to enter carbohydrates at mealtimes to calculate mealtime boluses  She is having a tendency to periodic hypoglycemia around 5-6 PM and also occasionally on waking up or early morning. She is also not always aware of symptoms of hypoglycemia with blood sugars as low as 47 Overall control: Appears to be better looking at her overall recent readings although A1c is about the same. Recently not having any hypoglycemia except when she has rebound from lower sugars She has however increase her frequency of glucose monitoring compared to the last time Other significant issues: Not checking blood sugars after her evening meal or in between meals to help adjust mealtime boluses Does not usually change her own basal rates COMPLIANCE with boluses has been fairly good although rarely will still forget; has 2.8 boluses per day.  She is again not entering carbohydrates in the pump for  her meals and not sure if she is following the pump settings for carb ratios  Monitors blood glucose: on an average 4.6 times a day according to pump download.  Blood Glucose readings:   PREMEAL Breakfast Lunch Dinner Bedtime Overall  Glucose range:  52-233   69-144   54-221   59-340   47-340   Mean/median:      95    POST-MEAL PC Breakfast PC Lunch PC Dinner  Glucose range:  66-122   47-147    Mean/median:      Hypoglycemic awareness: Has symptoms of feeling tired, headache, sweaty. Variable recognition threshold, lowest 47 recently She has 23% of readings below her target of 70  She has symptoms when blood glucose is less than 60. Uses glucose tablets for treatment when not at home but otherwise eats a snack .   Meals: 3 meals per day.  DIET has been relatively inconsistent overall and her mealtimes are variable. Frequently will not eat breakfast.  Usually trying to have low fat meals  Food preferences: Yogurt And cottage cheese for breakfast usually.  Physical activity: exercise: 45 min in ams. Historically after exercise blood sugar will get lower in 1-2 hrs. .  Certified Diabetes Educator visit: Most recent: 8/12.   She is usually regular with her eye exams  Wt Readings from Last 3 Encounters:  01/17/14 208 lb 3.2 oz (94.439 kg)  11/09/13 208 lb 12.8 oz (94.711 kg)  10/15/13 204 lb 6.4 oz (92.715  kg)    LABS:  Lab Results  Component Value Date   HGBA1C 7.2* 01/13/2014   HGBA1C 7.2* 10/11/2013   HGBA1C 7.8* 07/12/2013   Lab Results  Component Value Date   MICROALBUR 0.1 01/13/2014   LDLCALC 58 01/13/2014   CREATININE 0.8 01/13/2014    Appointment on 01/13/2014  Component Date Value Ref Range Status  . Hemoglobin A1C 01/13/2014 7.2* 4.6 - 6.5 % Final   Glycemic Control Guidelines for People with Diabetes:Non Diabetic:  <6%Goal of Therapy: <7%Additional Action Suggested:  >8%   . Sodium 01/13/2014 141  135 - 145 mEq/L Final  . Potassium 01/13/2014 3.9  3.5 - 5.1 mEq/L  Final  . Chloride 01/13/2014 107  96 - 112 mEq/L Final  . CO2 01/13/2014 28  19 - 32 mEq/L Final  . Glucose, Bld 01/13/2014 92  70 - 99 mg/dL Final  . BUN 01/13/2014 12  6 - 23 mg/dL Final  . Creatinine, Ser 01/13/2014 0.8  0.4 - 1.2 mg/dL Final  . Calcium 01/13/2014 9.7  8.4 - 10.5 mg/dL Final  . GFR 01/13/2014 76.26  >60.00 mL/min Final  . Cholesterol 01/13/2014 126  0 - 200 mg/dL Final   ATP III Classification       Desirable:  < 200 mg/dL               Borderline High:  200 - 239 mg/dL          High:  > = 240 mg/dL  . Triglycerides 01/13/2014 50.0  0.0 - 149.0 mg/dL Final   Normal:  <150 mg/dLBorderline High:  150 - 199 mg/dL  . HDL 01/13/2014 58.40  >39.00 mg/dL Final  . VLDL 01/13/2014 10.0  0.0 - 40.0 mg/dL Final  . LDL Cholesterol 01/13/2014 58  0 - 99 mg/dL Final  . Total CHOL/HDL Ratio 01/13/2014 2   Final                  Men          Women1/2 Average Risk     3.4          3.3Average Risk          5.0          4.42X Average Risk          9.6          7.13X Average Risk          15.0          11.0                      . Microalb, Ur 01/13/2014 0.1  0.0 - 1.9 mg/dL Final  . Creatinine,U 01/13/2014 98.2   Final  . Microalb Creat Ratio 01/13/2014 0.1  0.0 - 30.0 mg/g Final  . Color, Urine 01/13/2014 YELLOW  Yellow;Lt. Yellow Final  . APPearance 01/13/2014 CLEAR  Clear Final  . Specific Gravity, Urine 01/13/2014 1.020  1.000-1.030 Final  . pH 01/13/2014 5.5  5.0 - 8.0 Final  . Total Protein, Urine 01/13/2014 NEGATIVE  Negative Final  . Urine Glucose 01/13/2014 250* Negative Final  . Ketones, ur 01/13/2014 NEGATIVE  Negative Final  . Bilirubin Urine 01/13/2014 NEGATIVE  Negative Final  . Hgb urine dipstick 01/13/2014 TRACE-LYSED* Negative Final  . Urobilinogen, UA 01/13/2014 0.2  0.0 - 1.0 Final  . Leukocytes, UA 01/13/2014 SMALL* Negative Final  . Nitrite 01/13/2014 NEGATIVE  Negative Final  . WBC, UA 01/13/2014  3-6/hpf* 0-2/hpf Final  . RBC / HPF 01/13/2014 0-2/hpf  0-2/hpf  Final  . Squamous Epithelial / LPF 01/13/2014 Rare(0-4/hpf)  Rare(0-4/hpf) Final  . Ca Oxalate Crys, UA 01/13/2014 Presence of* None Final  . Vit D, 25-Hydroxy 01/13/2014 64  30 - 89 ng/mL Final   Comment: This assay accurately quantifies Vitamin D, which is the sum of the                          25-Hydroxy forms of Vitamin D2 and D3.  Studies have shown that the                          optimum concentration of 25-Hydroxy Vitamin D is 30 ng/mL or higher.                           Concentrations of Vitamin D between 20 and 29 ng/mL are considered to                          be insufficient and concentrations less than 20 ng/mL are considered                          to be deficient for Vitamin D.      Medication List       This list is accurate as of: 01/17/14  8:08 AM.  Always use your most recent med list.               CALCIUM + D PO  Take 600 mg by mouth 2 (two) times daily.     clopidogrel 75 MG tablet  Commonly known as:  PLAVIX  Take 1 tablet (75 mg total) by mouth daily.     FINACEA PLUS 15 % Kit  Apply 1 application topically at bedtime.     insulin lispro 100 UNIT/ML injection  Commonly known as:  HUMALOG  Use max 200 units per day with insulin pump     insulin pump Soln  Inject into the skin. Novolog Vial, max 200 units per day with pump     metFORMIN 500 MG 24 hr tablet  Commonly known as:  GLUCOPHAGE-XR  Take 2 tablets daily     metoprolol succinate 100 MG 24 hr tablet  Commonly known as:  TOPROL-XL  Take 1.5 tablet by mouth daily     MIRVASO 0.33 % Gel  Generic drug:  Brimonidine Tartrate  Apply 1 application topically daily.     niacin 1000 MG CR tablet  Commonly known as:  NIASPAN  Take 1,000 mg by mouth at bedtime.     nitroGLYCERIN 0.4 MG SL tablet  Commonly known as:  NITROSTAT  Place 1 tablet (0.4 mg total) under the tongue every 5 (five) minutes as needed.     pioglitazone 15 MG tablet  Commonly known as:  ACTOS  Take 1 tablet (15 mg  total) by mouth daily.     rosuvastatin 40 MG tablet  Commonly known as:  CRESTOR  Take 1 tablet (40 mg total) by mouth daily.     VITAMIN D PO  Take 1,000 mg by mouth daily.        Allergies:  Allergies  Allergen Reactions  . Septra [Bactrim] Rash  . Sulfa Antibiotics Rash    Past Medical History  Diagnosis  Date  . Coronary artery disease     2009 LAD Promus stent  . Hyperlipidemia   . Osteopenia   . Arthritis   . Type 1 diabetes mellitus on insulin therapy   . Fibroid   . Atrial flutter     a. s/p TEE/DCCV 08/16/13 (normal LV function, no LAA thrombus).b. s/p ablation by Dr Lovena Le 09-23-2013  . Valvular heart disease     a. Mild MR/TR by TEE 08/2013.    Past Surgical History  Procedure Laterality Date  . Coronary angioplasty with stent placement    . Pelvic laparoscopy    . Myomectomy  1977  . Appendectomy    . Cataract extraction    . Tee without cardioversion N/A 08/16/2013    Procedure: TRANSESOPHAGEAL ECHOCARDIOGRAM (TEE);  Surgeon: Thayer Headings, MD;  Location: [place];  Service: Cardiovascular;  Laterality: N/A;  . Cardioversion N/A 08/16/2013    Procedure: CARDIOVERSION;  Surgeon: Thayer Headings, MD;  Location: [place];  Service: Cardiovascular;  Laterality: N/A;  spoke with [name]  . Ablation  09-23-2013    CTI by Dr Lovena Le    Family History  Problem Relation Age of Onset  . Atrial fibrillation Mother   . Diabetes Mother   . Hypertension Mother   . CAD Father 49    died age 13  . CAD Brother 60    small vessel disease  . CAD Cousin     paternal  . CAD Paternal Grandfather     early onset    Social History:  reports that she has never smoked. She does not have any smokeless tobacco history on file. She reports that she drinks about 1.2 ounces of alcohol per week. She reports that she does not use illicit drugs.  REVIEW of systems:  She is concerned about weight gain and difficulty losing it  She has a history of coronary disease  but no recent chest pain  She has had diabetic dyslipidemia with significantly high LDL particle number and this has been managed with Crestor and Niaspan   In 9/14 her lipids were about the best they have been in a while with LDL 70, particle number near 1000 and particle size normal She had to go off her niacin because she could not take aspirin until recently and is trying to take it 30 minutes before eating  Lab Results  Component Value Date   CHOL 126 01/13/2014   HDL 58.40 01/13/2014   LDLCALC 58 01/13/2014   TRIG 50.0 01/13/2014   CHOLHDL 2 01/13/2014   No significant hypertension and has had atrial arrhythmias treated with metoprolol She had atrial fibrillation ablation done  Has history of arthritis  BP 132/82  Pulse 83  Temp(Src) 97.8 F (36.6 C)  Resp 16  Ht 5' 9" (1.753 m)  Wt 208 lb 3.2 oz (94.439 kg)  BMI 30.73 kg/m2  SpO2 97%   ASSESSMENT:   DIABETES type 1 with fair control, A1c is stable at 7.2  However her blood sugars are overall lower than the last time including tendency to low blood sugars in the late afternoon and before supper See history of present illness for detailed discussion of her current blood sugar patterns and management with her pump  Discussed with patient that since she is getting some degree of hypoglycemia unawareness with occasional blood sugars below 50 that is not recognized that we should raise her blood sugar target 220 Also will need to reduce her basal rates before  breakfast and supper to avoid hypoglycemia She probably needs less coverage for her meals especially lunchtime; however she continues to bolus without putting them carbohydrates in the pump Discussed continuous glucose monitoring and she will look into the DexCom  Hyperlipidemia: Since she has had a history of high particle number and CAD will continue niacin as she has small LDL patterns and this does not improve with Crestor  PLAN:   Check blood sugar more consistently  after meals especially supper Have a protein snack at bedtime if blood sugar below 120 Call if continuing to have hypoglycemia at consistent times BASAL rates with changes Midnight = 1.7. 2 AM = 1.3;5 AM = 1.8, 9 AM = 2.0, 12 noon = 1.3 and 5 PM = 2.7 11 p.m. = 2.0  Brochure on DexCom given Restart metformin ER 1000 mg a day. This will improve insulin sensitivity as well as enabling her to reduce insulin somewhat and lose weight  HYPERLIPIDEMIA: Well controlled and will do LDL particle number on the next visit, continue niacin  Counseling time over 50% of today's 25 minute visit  [name],AJAY

## 2014-01-24 ENCOUNTER — Telehealth: Payer: Self-pay | Admitting: *Deleted

## 2014-01-24 ENCOUNTER — Other Ambulatory Visit: Payer: Self-pay | Admitting: *Deleted

## 2014-01-24 MED ORDER — INSULIN LISPRO 100 UNIT/ML ~~LOC~~ SOLN: SUBCUTANEOUS | Status: DC

## 2014-01-24 NOTE — Telephone Encounter (Signed)
rx sent to [facility] on [address]

## 2014-01-31 ENCOUNTER — Telehealth: Payer: Self-pay | Admitting: Internal Medicine

## 2014-01-31 ENCOUNTER — Encounter: Payer: Self-pay | Admitting: *Deleted

## 2014-01-31 NOTE — Telephone Encounter (Addendum)
Will follow up with Dr Acie Fredrickson in 04/2014

## 2014-01-31 NOTE — Telephone Encounter (Signed)
She is gone for all of June expect one week and that happens to be the week [name] is out.  Will schedule her in May  Patient aware of date and time

## 2014-01-31 NOTE — Telephone Encounter (Signed)
New message     Pt had been seeing Dr Acie Fredrickson.  She was referred to Dr Lovena Le for an ablation in November 2014 and had a follow up appt in jan 2015.  Should pt go back to seeing Dr Acie Fredrickson or see Dr Lovena Le or see nobody.  She does not have a future appt.

## 2014-02-15 DIAGNOSIS — L719 Rosacea, unspecified: Secondary | ICD-10-CM | POA: Diagnosis not present

## 2014-02-25 ENCOUNTER — Telehealth: Payer: Self-pay | Admitting: Endocrinology

## 2014-02-25 ENCOUNTER — Other Ambulatory Visit: Payer: Self-pay | Admitting: *Deleted

## 2014-02-25 DIAGNOSIS — I2510 Atherosclerotic heart disease of native coronary artery without angina pectoris: Secondary | ICD-10-CM

## 2014-02-25 MED ORDER — NIACIN ER (ANTIHYPERLIPIDEMIC) 1000 MG PO TBCR: 1000.0000 mg | EXTENDED_RELEASE_TABLET | Freq: Every day | ORAL | Status: DC

## 2014-02-25 NOTE — Telephone Encounter (Signed)
Patient would like a refill on her Niacin ER 1000 mg   [facility]  Call back: [phone]

## 2014-03-11 ENCOUNTER — Encounter: Payer: Self-pay | Admitting: Cardiovascular Disease

## 2014-03-11 ENCOUNTER — Ambulatory Visit (INDEPENDENT_AMBULATORY_CARE_PROVIDER_SITE_OTHER): Payer: Medicare Other | Admitting: Cardiovascular Disease

## 2014-03-11 VITALS — BP 148/64 | HR 104 | Ht 69.0 in | Wt 198.0 lb

## 2014-03-11 DIAGNOSIS — E785 Hyperlipidemia, unspecified: Secondary | ICD-10-CM | POA: Diagnosis not present

## 2014-03-11 DIAGNOSIS — I2510 Atherosclerotic heart disease of native coronary artery without angina pectoris: Secondary | ICD-10-CM | POA: Diagnosis not present

## 2014-03-11 DIAGNOSIS — I4892 Unspecified atrial flutter: Secondary | ICD-10-CM | POA: Diagnosis not present

## 2014-03-11 MED ORDER — METOPROLOL SUCCINATE ER 100 MG PO TB24: 100.0000 mg | ORAL_TABLET | Freq: Every day | ORAL | Status: DC

## 2014-03-11 NOTE — Assessment & Plan Note (Signed)
Number is doing well. She status post atrial flutter ablation. She's not had any further episodes of atrial flutter.

## 2014-03-11 NOTE — Assessment & Plan Note (Signed)
She's done well. She's not having episodes of chest pain or shortness of breath. Continue with her same medications.  Somehow she stopped taking her metoprolol. She's been quite busy taking care of her mother and her husband and is determined. Because of this, her heart rate and blood pressure are little elevated. Will restart her on metoprolol XL 100 mg a day.

## 2014-03-11 NOTE — Patient Instructions (Signed)
Your physician has recommended you make the following change in your medication:  1) RESTART Metoprolol 100mg  daily. An Rx has been sent to your pharmacy  Take all other medications as prescribed  Your physician recommends that you return for a FASTING lipid profile, alt, bmet in 6 months  Your physician wants you to follow-up in: 6 months You will receive a reminder letter in the mail two months in advance. If you don't receive a letter, please call our office to schedule the follow-up appointment.

## 2014-03-11 NOTE — Assessment & Plan Note (Signed)
Check her lipids in 6 months  At her next office visit.

## 2014-03-11 NOTE — Progress Notes (Signed)
[name] Date of Birth  [birth year] [place] [address]    [place]   [place], [place]  [address]    [place], [place]  [phone]  Fax  [phone]  [phone]  Fax [phone]   Problems. 1. CAD 2. Atrial Flutter 3. Diabetes Mellitus.   History of Present Illness:  [name] is doing very well. She's not having episodes of chest pain or shortness of breath. She's been able to below of her normal activities without any significant problems. She has not had chest pains .  Her insurance will run out the month before she goes on Medicare.   She has not had any CP and no palpitations.    April 08, 2013:   [name] is doing well. No CP.  Rhythm is stable.     Mar 11, 2014:  [name] is doing well.  She had a atrial flutter ablation several months ago.  She has not been taking her metoprolol..  Current Outpatient Prescriptions on File Prior to Visit  Medication Sig Dispense Refill  . Azelaic Acid-Cleanser-Lotion (FINACEA PLUS) 15 % KIT Apply 1 application topically at bedtime.       . Brimonidine Tartrate (MIRVASO) 0.33 % GEL Apply 1 application topically daily.       . Calcium Carbonate-Vitamin D (CALCIUM + D PO) Take 600 mg by mouth 2 (two) times daily.       . Cholecalciferol (VITAMIN D PO) Take 1,000 mg by mouth daily.       . clopidogrel (PLAVIX) 75 MG tablet Take 1 tablet (75 mg total) by mouth daily.  90 tablet  1  . Insulin Aspart (INSULIN PUMP) 100 unit/ml SOLN Inject into the skin. Novolog Vial, max 200 units per day with pump      . insulin lispro (HUMALOG) 100 UNIT/ML injection Use max 200 units per day with insulin pump  9 vial  1  . metFORMIN (GLUCOPHAGE-XR) 500 MG 24 hr tablet Take 2 tablets daily  180 tablet  1  . metoprolol succinate (TOPROL-XL) 100 MG 24 hr tablet Take 1.5 tablet by mouth daily  135 tablet  3  . niacin (NIASPAN) 1000 MG CR tablet Take 1 tablet (1,000 mg total) by mouth at bedtime.   90 tablet  3  . nitroGLYCERIN (NITROSTAT) 0.4 MG SL tablet Place 1 tablet (0.4 mg total) under the tongue every 5 (five) minutes as needed.  25 tablet  11  . pioglitazone (ACTOS) 15 MG tablet Take 1 tablet (15 mg total) by mouth daily.  90 tablet  3  . rosuvastatin (CRESTOR) 40 MG tablet Take 1 tablet (40 mg total) by mouth daily.  90 tablet  1   No current facility-administered medications on file prior to visit.    Allergies  Allergen Reactions  . Septra [Bactrim] Rash  . Sulfa Antibiotics Rash    Past Medical History  Diagnosis Date  . Coronary artery disease     2009 LAD Promus stent  . Hyperlipidemia   . Osteopenia   . Arthritis   . Type 1 diabetes mellitus on insulin therapy   . Fibroid   . Atrial flutter     a. s/p TEE/DCCV 08/16/13 (normal LV function, no LAA thrombus).b. s/p ablation by Dr Lovena Le 09-23-2013  . Valvular heart disease     a. Mild MR/TR by TEE 08/2013.    Past Surgical History  Procedure Laterality Date  .  Coronary angioplasty with stent placement    . Pelvic laparoscopy    . Myomectomy  1977  . Appendectomy    . Cataract extraction    . Tee without cardioversion N/A 08/16/2013    Procedure: TRANSESOPHAGEAL ECHOCARDIOGRAM (TEE);  Surgeon: Thayer Headings, MD;  Location: [facility];  Service: Cardiovascular;  Laterality: N/A;  . Cardioversion N/A 08/16/2013    Procedure: CARDIOVERSION;  Surgeon: Thayer Headings, MD;  Location: [facility];  Service: Cardiovascular;  Laterality: N/A;  spoke with [name]  . Ablation  09-23-2013    CTI by Dr Lovena Le    History  Smoking status  . Never Smoker   Smokeless tobacco  . Not on file    History  Alcohol Use  . 1.2 oz/week  . 2 Glasses of wine per week    Family History  Problem Relation Age of Onset  . Atrial fibrillation Mother   . Diabetes Mother   . Hypertension Mother   . CAD Father 66    died age 21  . CAD Brother 31    small vessel disease  . CAD Cousin     paternal  . CAD Paternal  Grandfather     early onset    Reviw of Systems:  Reviewed in the HPI.  All other systems are negative.  Physical Exam: Blood pressure 148/64, pulse 104, height 5' 9" (1.753 m), weight 198 lb (89.812 kg). General: Well developed, well nourished, in no acute distress.  Head: Normocephalic, atraumatic, sclera non-icteric, mucus membranes are moist,   Neck: Supple. Negative for carotid bruits. JVD not elevated.  Lungs: Clear bilaterally to auscultation without wheezes, rales, or rhonchi. Breathing is unlabored.  Heart: RRR with S1 S2. No murmurs, rubs, or gallops appreciated.  Abdomen: Soft, non-tender, non-distended with normoactive bowel sounds. No hepatomegaly. No rebound/guarding. No obvious abdominal masses.  Msk:  Strength and tone appear normal for age.  Extremities: No clubbing or cyanosis. No edema.  Distal pedal pulses are 2+ and equal bilaterally.  Neuro: Alert and oriented X 3. Moves all extremities spontaneously.  Psych:  Responds to questions appropriately with a normal affect.  ECG: April 08, 2013:  NSR at 79. No ST or T wave changes.  Assessment / Plan:

## 2014-03-14 ENCOUNTER — Telehealth: Payer: Self-pay | Admitting: Endocrinology

## 2014-03-14 ENCOUNTER — Other Ambulatory Visit: Payer: Self-pay | Admitting: *Deleted

## 2014-03-14 DIAGNOSIS — I2510 Atherosclerotic heart disease of native coronary artery without angina pectoris: Secondary | ICD-10-CM

## 2014-03-14 MED ORDER — NIACIN ER (ANTIHYPERLIPIDEMIC) 1000 MG PO TBCR
1000.0000 mg | EXTENDED_RELEASE_TABLET | Freq: Every day | ORAL | Status: DC
Start: 2014-03-14 — End: 2015-05-04

## 2014-03-14 NOTE — Telephone Encounter (Signed)
Niacin CR Tablets 90 Day supply  [facility]   Thank You :)   Call back:  [phone]

## 2014-03-21 ENCOUNTER — Telehealth: Payer: Self-pay | Admitting: Endocrinology

## 2014-03-21 NOTE — Telephone Encounter (Signed)
Have we received the insulin pump supply form from [facility]

## 2014-03-29 ENCOUNTER — Other Ambulatory Visit: Payer: Self-pay | Admitting: Endocrinology

## 2014-03-29 DIAGNOSIS — E1065 Type 1 diabetes mellitus with hyperglycemia: Secondary | ICD-10-CM

## 2014-03-29 DIAGNOSIS — IMO0002 Reserved for concepts with insufficient information to code with codable children: Secondary | ICD-10-CM

## 2014-03-30 ENCOUNTER — Encounter: Payer: Medicare Other | Attending: Endocrinology | Admitting: Nutrition

## 2014-03-30 DIAGNOSIS — E1065 Type 1 diabetes mellitus with hyperglycemia: Secondary | ICD-10-CM

## 2014-03-30 DIAGNOSIS — Z713 Dietary counseling and surveillance: Secondary | ICD-10-CM | POA: Insufficient documentation

## 2014-03-30 DIAGNOSIS — IMO0002 Reserved for concepts with insufficient information to code with codable children: Secondary | ICD-10-CM

## 2014-03-30 NOTE — Progress Notes (Signed)
This patient came in with the receiver fully charged.  She reported having read over the directions, but said that she needed assistance with the insertion of the sensor and attachment of the transmitter.    We reviewed how to insert the sensor, and she did this without any problems.  She was reminded to make sure that it is at least 2 inches from her infusion set site.  She reported good understanding of this.  We also reviewed the need to do 2 calibrations in 2 hours from now as well as 1 calibration q 12 hours.  She was shown how to do this as well, and reported good understanding of this as well.  Her Dexcom alarms were set to: high alarm: 250,  Low alarm: 75,  Repeat high alarm: 2hr.,  Repeat low alarm: 30 min.  Also, rapid rate blood sugar drop alarm is set for 3mg /min.

## 2014-04-18 ENCOUNTER — Ambulatory Visit: Payer: Medicare Other | Admitting: Endocrinology

## 2014-04-19 ENCOUNTER — Other Ambulatory Visit (INDEPENDENT_AMBULATORY_CARE_PROVIDER_SITE_OTHER): Payer: Medicare Other

## 2014-04-19 DIAGNOSIS — E782 Mixed hyperlipidemia: Secondary | ICD-10-CM | POA: Diagnosis not present

## 2014-04-19 DIAGNOSIS — IMO0002 Reserved for concepts with insufficient information to code with codable children: Secondary | ICD-10-CM

## 2014-04-19 DIAGNOSIS — E1065 Type 1 diabetes mellitus with hyperglycemia: Secondary | ICD-10-CM | POA: Diagnosis not present

## 2014-04-19 LAB — BASIC METABOLIC PANEL
BUN: 10 mg/dL (ref 6–23)
CHLORIDE: 106 meq/L (ref 96–112)
CO2: 30 meq/L (ref 19–32)
Calcium: 9.1 mg/dL (ref 8.4–10.5)
Creatinine, Ser: 0.9 mg/dL (ref 0.4–1.2)
GFR: 70.09 mL/min (ref >60.00)
GLUCOSE: 204 mg/dL — AB (ref 70–99)
POTASSIUM: 4.9 meq/L (ref 3.5–5.1)
SODIUM: 139 meq/L (ref 135–145)

## 2014-04-19 LAB — MICROALBUMIN / CREATININE URINE RATIO
Creatinine,U: 107.9 mg/dL
Microalb Creat Ratio: 0.2 mg/g (ref 0.0–30.0)
Microalb, Ur: 0.2 mg/dL (ref 0.0–1.9)

## 2014-04-19 LAB — HEMOGLOBIN A1C: Hgb A1c MFr Bld: 6.9 % — ABNORMAL HIGH (ref 4.6–6.5)

## 2014-04-21 ENCOUNTER — Ambulatory Visit (INDEPENDENT_AMBULATORY_CARE_PROVIDER_SITE_OTHER): Payer: Medicare Other | Admitting: Endocrinology

## 2014-04-21 ENCOUNTER — Encounter: Payer: Self-pay | Admitting: Endocrinology

## 2014-04-21 VITALS — BP 128/68 | HR 75 | Temp 98.2°F | Resp 16 | Ht 69.0 in | Wt 181.4 lb

## 2014-04-21 DIAGNOSIS — I2510 Atherosclerotic heart disease of native coronary artery without angina pectoris: Secondary | ICD-10-CM | POA: Diagnosis not present

## 2014-04-21 DIAGNOSIS — E785 Hyperlipidemia, unspecified: Secondary | ICD-10-CM | POA: Diagnosis not present

## 2014-04-21 DIAGNOSIS — E1065 Type 1 diabetes mellitus with hyperglycemia: Secondary | ICD-10-CM | POA: Diagnosis not present

## 2014-04-21 DIAGNOSIS — IMO0002 Reserved for concepts with insufficient information to code with codable children: Secondary | ICD-10-CM | POA: Diagnosis not present

## 2014-04-21 LAB — LIPOPROTEIN ANALYSIS BY NMR
HDL PARTICLE NUMBER: 29.6 umol/L — AB (ref >=30.5)
LDL PARTICLE NUMBER: 1046 nmol/L — AB (ref <1000)
LDL SIZE: 21.5 nm (ref >20.5)
Small LDL Particle Number: 207 nmol/L (ref <=527)

## 2014-04-21 NOTE — Progress Notes (Signed)
Patient ID: [name], female   DOB: 15-Aug-1946, 68 y.o.   MRN: [id number]   Reason for visit: DIABETES followup.   HISTORY of present illness:  Diagnosis: Type 1 diabetes mellitus, date of diagnosis: 1979.   Insulin Pump: CURRENT brand:  Ping The pump settings are Basal rate: Midnight = 1.7. 2 AM = 1 1.3;5 AM = 1.8, 9 AM = 2.0, 12 noon = 1.3 and 5 PM = 2.7, 11 PM = 2.0   Carbohydrate ratio 1:9 lunch and 1:5 at dinner; sensitivity 1:30, 25 after 5 p.m.; target 120  Insulin on board indicator on, duration 4 hrs,   DIABETES: She has had long-standing diabetes with A1c levels usually above target. She had relatively high readings in 2013.  In 10/13 ACTOS was restarted because of her large insulin requirement and diabetic dyslipidemia.  Her blood sugars improved significantly and basal rates were reduced. Her best A1c was 6.9.   RECENT history:  She has started using the continuous glucose sensor since her last visit She has found this to be variable useful; mostly has benefited from avoiding low sugars during the night She is concerned about the frequent alarms she is experiencing during the night on some nights and her low threshold is 75 Also she is concerned about the cost since Medicare refuses to cover the sensor She does use it consistently and has been finding it accurate Current blood sugar patterns:  Fairly good fasting readings  Tendency to low normal or mildly low readings between 2 AM and 5 AM  Mostly high readings late in the evening, sometimes related to eating sweets  Has persistently high readings overnight on a couple of nights which she thinks is from eating desserts  She does not think her sugars change when she is exercising Other significant issues: She has had some difficulty with control because of traveling overseas recently Hypoglycemia unawareness Her blood sugar target was increased to 120 on her last visit and has been able to avoid frequent  hypoglycemia during the day but this COMPLIANCE with boluses has been fairly good although rarely will still forget; has 2.8 boluses per day.  She is again not entering carbohydrates in the pump for her meals and not sure if she is following the pump settings for carb ratios  Monitors blood glucose: on an average 4.6 times a day according to pump download.  Blood Glucose readings on her monitor:   PREMEAL Breakfast Lunch Dinner Bedtime Overall  Glucose range:  49-246   58-215   65-274   104-341    Mean/median:  122   122   234   186   167     CGM RECORD INTERPRETATION    Dates of Recording: 04/15/14 through 04/21/14     Sensor  summary:  Quality of the data is excellent with adequate sensor function. Glucose excursion profile shows 48% of readings in the high range and 19% low with overall average 137, standard deviation 58      Glycemic patterns:   most significant patterns of high present between 8 PM-midnight and significant pattern of lows between 3 AM-4 AM      Overnight periods:   show variable control with both patterns of hyperglycemia and hypoglycemia. However hypoglycemia is generally mild and occurring only on 2 of the nights with blood sugars being the lowest between about 3 AM-5 AM. Blood sugars showing as low on 6/14 at about 3-4 AM was actually 6 hours later when she  was in [place]      Preprandial periods:   her sugars are fairly good before meals with an average of about 121 at 8 AM, 108 at 1 PM and relatively higher on an average at about 6 PM when the average is 142      Postprandial periods:  blood sugars after breakfast are only minimally higher her on 1 of the days, fairly good after lunch but generally higher after evening meal. Evening meal is spiking only on 6/16 otherwise blood sugars appear to be gradually increasing after 6-7 PM      Hypoglycemia:  as above this has occurred mostly on 6/14 transiently at about 9-10 AM local time and also has transiently low normal  readings sporadically at different times, mostly before meals or at 3-4 AM      Recommendations  increase basal rate after 6-7 PM and reduce basal rate between 2-5 AM     Hypoglycemic awareness: Has symptoms of feeling tired, headache, sweaty. Variable recognition threshold present  She has symptoms when blood glucose is less than 60. Uses glucose tablets for treatment when not at home but otherwise eats a snack .   Meals: 3 meals per day.  DIET has been relatively inconsistent overall and her mealtimes are variable. Frequently will not eat breakfast.  Usually trying to have low fat meals  Food preferences: Yogurt And cottage cheese for breakfast usually.  Physical activity: exercise: 45 min in ams. Historically after exercise blood sugar will get lower in 1-2 hrs. .  Certified Diabetes Educator visit: Most recent: 8/12.   She is usually regular with her eye exams  Wt Readings from Last 3 Encounters:  04/21/14 181 lb 6.4 oz (82.283 kg)  03/11/14 198 lb (89.812 kg)  01/17/14 208 lb 3.2 oz (94.439 kg)    LABS:  Lab Results  Component Value Date   HGBA1C 6.9* 04/19/2014   HGBA1C 7.2* 01/13/2014   HGBA1C 7.2* 10/11/2013   Lab Results  Component Value Date   MICROALBUR 0.2 04/19/2014   LDLCALC 58 01/13/2014   CREATININE 0.9 04/19/2014    Appointment on 04/19/2014  Component Date Value Ref Range Status  . Hemoglobin A1C 04/19/2014 6.9* 4.6 - 6.5 % Final   Glycemic Control Guidelines for People with Diabetes:Non Diabetic:  <6%Goal of Therapy: <7%Additional Action Suggested:  >8%   . Sodium 04/19/2014 139  135 - 145 mEq/L Final  . Potassium 04/19/2014 4.9  3.5 - 5.1 mEq/L Final  . Chloride 04/19/2014 106  96 - 112 mEq/L Final  . CO2 04/19/2014 30  19 - 32 mEq/L Final  . Glucose, Bld 04/19/2014 204* 70 - 99 mg/dL Final  . BUN 04/19/2014 10  6 - 23 mg/dL Final  . Creatinine, Ser 04/19/2014 0.9  0.4 - 1.2 mg/dL Final  . Calcium 04/19/2014 9.1  8.4 - 10.5 mg/dL Final  . GFR 04/19/2014  70.09  >60.00 mL/min Final  . LDL Particle Number 04/19/2014 [id number]* <1000 nmol/L Final   Comment:                           Low                   < 1000  Moderate         1000 - 1299                                                    Borderline-High  1300 - 1599                                                    High             1600 - 2000                                                    Very High             > 2000  . HDL Particle Number 04/19/2014 29.6* >=30.5 umol/L Final  . Small LDL Particle Number 04/19/2014 207  <=527 nmol/L Final  . LDL Size 04/19/2014 21.5  >20.5 nm Final   Comment:  ----------------------------------------------------------                                           ** INTERPRETATIVE INFORMATION**                                           PARTICLE CONCENTRATION AND SIZE                                              <--Lower CVD Risk   Higher CVD Risk-->                            LDL AND HDL PARTICLES   Percentile in Reference Population                            HDL-P (total)        High     75th    50th    25th   Low                                                 >34.9    34.9    30.5    26.7   <26.7                            Small LDL-P          Low      25th    50th    75th   High                                                 <  117     117     527     839    >839                            LDL Size   <-Large (Pattern A)->    <-Small (Pattern B)->                                              23.0    20.6           20.5      19.0                           ----------------------------------------------------------                          Small LDL-P and LDL Size are associated with CVD risk, but not after                          LDL-P is taken into account.                          These assays were developed and their performance characteristics                          determined by [employer]. These  assays have not been cleared by the                          [place] Food and Drug Administration. The clinical utility of these                          laboratory values have not been fully established.  Marland Kitchen LP-IR Score 04/19/2014 <25  <=45 Final   Comment: INSULIN RESISTANCE MARKER                              <--Insulin Sensitive    Insulin Resistant-->                                     Percentile in Reference Population                          Insulin Resistance Score                          LP-IR Score   Low   25th   50th   75th   High                                        <27   27     45     63     >63  LP-IR Score is inaccurate if patient is non-fasting.                          The LP-IR score is a laboratory developed index that has been                          associated with insulin resistance and diabetes risk and should be                          used as one component of a physician's clinical assessment. The                          LP-IR score listed above has not been cleared by the [place] Food and                          Drug Administration.  . Microalb, Ur 04/19/2014 0.2  0.0 - 1.9 mg/dL Final  . Creatinine,U 04/19/2014 107.9   Final  . Microalb Creat Ratio 04/19/2014 0.2  0.0 - 30.0 mg/g Final      Medication List       This list is accurate as of: 04/21/14  9:00 AM.  Always use your most recent med list.               CALCIUM + D PO  Take 600 mg by mouth 2 (two) times daily.     clopidogrel 75 MG tablet  Commonly known as:  PLAVIX  Take 1 tablet (75 mg total) by mouth daily.     FINACEA PLUS 15 % Kit  Apply 1 application topically at bedtime.     insulin lispro 100 UNIT/ML injection  Commonly known as:  HUMALOG  Use max 200 units per day with insulin pump     insulin pump Soln  Inject into the skin. Novolog Vial, max 200 units per day with pump     metFORMIN 500 MG 24 hr tablet  Commonly known as:  GLUCOPHAGE-XR  Take 2  tablets daily     metoprolol succinate 100 MG 24 hr tablet  Commonly known as:  TOPROL-XL  Take 1 tablet (100 mg total) by mouth daily.     MIRVASO 0.33 % Gel  Generic drug:  Brimonidine Tartrate  Apply 1 application topically daily.     niacin 1000 MG CR tablet  Commonly known as:  NIASPAN  Take 1 tablet (1,000 mg total) by mouth at bedtime.     nitroGLYCERIN 0.4 MG SL tablet  Commonly known as:  NITROSTAT  Place 1 tablet (0.4 mg total) under the tongue every 5 (five) minutes as needed.     pioglitazone 15 MG tablet  Commonly known as:  ACTOS  Take 1 tablet (15 mg total) by mouth daily.     rosuvastatin 40 MG tablet  Commonly known as:  CRESTOR  Take 1 tablet (40 mg total) by mouth daily.     VITAMIN D PO  Take 1,000 mg by mouth daily.        Allergies:  Allergies  Allergen Reactions  . Septra [Bactrim] Rash  . Sulfa Antibiotics Rash    Past Medical History  Diagnosis Date  . Coronary artery disease     2009 LAD Promus stent  . Hyperlipidemia   . Osteopenia   .  Arthritis   . Type 1 diabetes mellitus on insulin therapy   . Fibroid   . Atrial flutter     a. s/p TEE/DCCV 08/16/13 (normal LV function, no LAA thrombus).b. s/p ablation by Dr Lovena Le 09-23-2013  . Valvular heart disease     a. Mild MR/TR by TEE 08/2013.    Past Surgical History  Procedure Laterality Date  . Coronary angioplasty with stent placement    . Pelvic laparoscopy    . Myomectomy  1977  . Appendectomy    . Cataract extraction    . Tee without cardioversion N/A 08/16/2013    Procedure: TRANSESOPHAGEAL ECHOCARDIOGRAM (TEE);  Surgeon: Thayer Headings, MD;  Location: [facility];  Service: Cardiovascular;  Laterality: N/A;  . Cardioversion N/A 08/16/2013    Procedure: CARDIOVERSION;  Surgeon: Thayer Headings, MD;  Location: [facility];  Service: Cardiovascular;  Laterality: N/A;  spoke with [name]  . Ablation  09-23-2013    CTI by Dr Lovena Le    Family History  Problem Relation Age of  Onset  . Atrial fibrillation Mother   . Diabetes Mother   . Hypertension Mother   . CAD Father 30    died age 36  . CAD Brother 18    small vessel disease  . CAD Cousin     paternal  . CAD Paternal Grandfather     early onset    Social History:  reports that she has never smoked. She does not have any smokeless tobacco history on file. She reports that she drinks about 1.2 ounces of alcohol per week. She reports that she does not use illicit drugs.  REVIEW of systems:  She is concerned about weight gain and difficulty losing it  She has a history of coronary disease but no recent chest pain  She has had diabetic dyslipidemia with significantly high LDL particle number and this has been managed with Crestor and Niaspan   In 9/14 her lipids were about the best they have been in a while with LDL 70, particle number near 1000 and particle size normal She had to go off her niacin because she could not take aspirin until recently and is trying to take it 30 minutes before eating  Lab Results  Component Value Date   CHOL 126 01/13/2014   HDL 58.40 01/13/2014   LDLCALC 58 01/13/2014   TRIG 50.0 01/13/2014   CHOLHDL 2 01/13/2014   No significant hypertension and has had atrial arrhythmias treated with metoprolol She had atrial fibrillation ablation done  Has history of arthritis  BP 128/68  Pulse 75  Temp(Src) 98.2 F (36.8 C)  Resp 16  Ht 5' 9" (1.753 m)  Wt 181 lb 6.4 oz (82.283 kg)  BMI 26.78 kg/m2  SpO2 96%   ASSESSMENT:   DIABETES type 1 with fair control, A1c is improved at 6.9  See history of present illness for detailed discussion of her current blood sugar patterns and management with her pump and analysis of her continuous glucose sensor In brief her problems appear to be tending to get low overnight and highest late in the evening Some of her high readings are from dietary indiscretion and she is not able to bring blood sugar back in line easily She is wanting  to continue using the sensor but because of the cost advised her that she can use it only part of the time, possibly only once a month Meanwhile she will try to be consistent with her diet and take  adequate boluses when blood sugar is rising as indicated on her continuous glucose sensor  Hyperlipidemia: Since she has had a history of high particle number and CAD. LDL particle number is close to 1000 and she will continue niacin in addition to Crestor and Actos  PLAN:   Basal rate at 2 AM = 1.2 and at 7 PM = 2.9 Low sugar threshold on her sensor to be 70 Continue frequent glucose monitoring also She will look into the new integrated pump when her current pump is out of warranty Continue improving diet including for improvement in lipids   Counseling time over 50% of today's 25 minute visit  [name],AJAY

## 2014-04-21 NOTE — Patient Instructions (Signed)
Change basals  Enter carbs at each meal

## 2014-04-22 ENCOUNTER — Other Ambulatory Visit: Payer: Self-pay | Admitting: Endocrinology

## 2014-05-02 ENCOUNTER — Other Ambulatory Visit: Payer: Self-pay | Admitting: *Deleted

## 2014-05-02 MED ORDER — INSULIN LISPRO 100 UNIT/ML ~~LOC~~ SOLN: SUBCUTANEOUS | Status: DC

## 2014-05-04 LAB — HM DIABETES EYE EXAM

## 2014-05-18 ENCOUNTER — Encounter: Payer: Self-pay | Admitting: Internal Medicine

## 2014-06-14 ENCOUNTER — Encounter: Payer: Self-pay | Admitting: Internal Medicine

## 2014-06-23 ENCOUNTER — Telehealth: Payer: Self-pay

## 2014-06-23 ENCOUNTER — Telehealth: Payer: Self-pay | Admitting: *Deleted

## 2014-06-23 NOTE — Telephone Encounter (Signed)
June 23, 2014  [facility] [address] [place] [address]  Dear Dr.[name]:  Delfin Edis, MD has scheduled the above patient for a colonoscopy on 07/08/14.  Our records show that she is on insulin therapy via an insulin pump.  Our colonoscopy prep protocol requires that:  the patient must be on a clear liquid diet the entire day prior to the procedure date as well as the morning of the procedure  the patient must be NPO for 2 hours prior to the procedure   the patient must consume a PEG 3350 solution to prepare for the procedure.  Please advise [place] of any adjustments that need to be made to the patient's insulin pump therapy prior to the above procedure date.    Please route instructions to me at [phone].  If you have any questions, please call me at [phone].  Thank you for your help with this matter.  Sincerely,  Dixon Boos

## 2014-06-23 NOTE — Telephone Encounter (Signed)
06/23/2014  RE: [name] DOB: June 08, 1946 MRN: [id number]  Dear Cathie Olden,   We have scheduled the above patient for a colonoscopy procedure. Our records show that she is on anticoagulation therapy.  Please advise as whether the patient may come off her therapy of Plavix prior to the procedure, which is scheduled for 07/08/14. Please route your response to Dixon Boos, CMA.  Sincerely,  Dixon Boos

## 2014-06-23 NOTE — Telephone Encounter (Signed)
She may hold her plavix for 7 days.  I do not want her to stop her aspirin. She is at low risk for colonoscopy.

## 2014-06-23 NOTE — Telephone Encounter (Signed)
Needs orders from MD managing diabetes for insulin pump.  Colonoscopy scheduled for Friday 07/08/14.

## 2014-06-24 NOTE — Telephone Encounter (Signed)
Patient will be advised when she comes to see Nicoletta Ba on 07/01/14

## 2014-07-01 ENCOUNTER — Ambulatory Visit (INDEPENDENT_AMBULATORY_CARE_PROVIDER_SITE_OTHER): Payer: Medicare Other | Admitting: Physician Assistant

## 2014-07-01 ENCOUNTER — Encounter: Payer: Self-pay | Admitting: Physician Assistant

## 2014-07-01 ENCOUNTER — Telehealth: Payer: Self-pay | Admitting: *Deleted

## 2014-07-01 VITALS — BP 120/60 | HR 78 | Ht 69.0 in | Wt 209.0 lb

## 2014-07-01 DIAGNOSIS — IMO0002 Reserved for concepts with insufficient information to code with codable children: Secondary | ICD-10-CM | POA: Diagnosis not present

## 2014-07-01 DIAGNOSIS — I2510 Atherosclerotic heart disease of native coronary artery without angina pectoris: Secondary | ICD-10-CM

## 2014-07-01 DIAGNOSIS — E1065 Type 1 diabetes mellitus with hyperglycemia: Secondary | ICD-10-CM

## 2014-07-01 DIAGNOSIS — Z1211 Encounter for screening for malignant neoplasm of colon: Secondary | ICD-10-CM

## 2014-07-01 DIAGNOSIS — Z7901 Long term (current) use of anticoagulants: Secondary | ICD-10-CM | POA: Diagnosis not present

## 2014-07-01 NOTE — Patient Instructions (Addendum)
You have been scheduled for a colonoscopy. Please follow written instructions given to you at your visit today.   If you use inhalers (even only as needed), please bring them with you on the day of your procedure. Your physician has requested that you go to www.startemmi.com and enter the access code given to you at your visit today. This web site gives a general overview about your procedure. However, you should still follow specific instructions given to you by our office regarding your preparation for the procedure.  We will call you once we talk to Dr. [name] Monday regarding the insulin pump instructions prior to the procedure. So not take the Plavix after today. Resume it the day after the procedure.

## 2014-07-01 NOTE — Telephone Encounter (Signed)
I left a message for Suanne Marker, CMA who works with Dr. Elayne Snare.  We need his instructions for the patient for the insulin pump prior to the colonoscopy scheduled for 07-08-2014.

## 2014-07-01 NOTE — Progress Notes (Signed)
Subjective:    Patient ID: [name], female    DOB: 12/30/1945, 68 y.o.   MRN: [id number]  HPI  [name] is a pleasant 68 year old white female known to Dr. Delfin Edis who comes in today to discuss followup colonoscopy. She is scheduled for colonoscopy on 07/08/2014. Her last exam was in 2005 and was normal. Patient is no family history of colon cancer. She is currently asymptomatic no complaints of abdominal pain melena or hematochezia or changes in her bowel habits. She is an insulin-dependent diabetic and has an insulin pump, followed by Dr. [name] She also has history of coronary artery disease status post stenting 2009 and is maintained on Plavix. She is followed by Dr. Cathie Olden. She has history of atrial flutter for which she underwent an ablation last fall.    Review of Systems  Constitutional: Negative.   HENT: Negative.   Eyes: Negative.   Respiratory: Negative.   Cardiovascular: Negative.   Gastrointestinal: Negative.   Endocrine: Negative.   Genitourinary: Negative.   Musculoskeletal: Negative.   Skin: Negative.   Allergic/Immunologic: Negative.   Neurological: Negative.   Hematological: Negative.   Psychiatric/Behavioral: Negative.    Outpatient Prescriptions Prior to Visit  Medication Sig Dispense Refill  . Calcium Carbonate-Vitamin D (CALCIUM + D PO) Take 600 mg by mouth 2 (two) times daily.       . Cholecalciferol (VITAMIN D PO) Take 1,000 mg by mouth daily.       . clopidogrel (PLAVIX) 75 MG tablet Take 1 tablet by mouth  daily  90 tablet  1  . CRESTOR 40 MG tablet Take 1 tablet by mouth  daily  90 tablet  1  . Insulin Aspart (INSULIN PUMP) 100 unit/ml SOLN Inject into the skin. Novolog Vial, max 200 units per day with pump      . insulin lispro (HUMALOG) 100 UNIT/ML injection Use max 200 units per day with insulin pump  9 vial  2  . metFORMIN (GLUCOPHAGE-XR) 500 MG 24 hr tablet Take 2 tablets daily  180 tablet  1  . metoprolol succinate (TOPROL-XL) 100 MG 24  hr tablet Take 1 tablet (100 mg total) by mouth daily.  90 tablet  3  . niacin (NIASPAN) 1000 MG CR tablet Take 1 tablet (1,000 mg total) by mouth at bedtime.  90 tablet  3  . nitroGLYCERIN (NITROSTAT) 0.4 MG SL tablet Place 1 tablet (0.4 mg total) under the tongue every 5 (five) minutes as needed.  25 tablet  11  . pioglitazone (ACTOS) 15 MG tablet Take 1 tablet (15 mg total) by mouth daily.  90 tablet  3  . Azelaic Acid-Cleanser-Lotion (FINACEA PLUS) 15 % KIT Apply 1 application topically at bedtime.       . Brimonidine Tartrate (MIRVASO) 0.33 % GEL Apply 1 application topically daily.        No facility-administered medications prior to visit.   Allergies  Allergen Reactions  . Septra [Bactrim] Rash  . Sulfa Antibiotics Rash   Patient Active Problem List   Diagnosis Date Noted  . Other and unspecified hyperlipidemia 07/20/2013  . Type I (juvenile type) diabetes mellitus without mention of complication, uncontrolled 05/12/2013  . Chronic insomnia 03/25/2012  . Fibroid   . Osteopenia   . Coronary artery disease 04/05/2011  . Atrial flutter 04/05/2011   History  Substance Use Topics  . Smoking status: Never Smoker   . Smokeless tobacco: Never Used  . Alcohol Use: 1.2 oz/week    2  Glasses of wine per week   family history includes Atrial fibrillation in her mother; CAD in her cousin and paternal grandfather; CAD (age of onset: 34) in her brother; CAD (age of onset: 34) in her father; Diabetes in her mother; Hypertension in her mother. There is no history of Colon cancer.     Objective:   Physical Exam  well-developed older white female in no acute distress pleasant, blood pressure 120/60 pulse 78 height 5 foot 9 weight 209. HEENT ;nontraumatic normocephalic EOMI PERRLA sclera anicteric, Supple; no JVD, Cardiovascular; regular rate and rhythm with S1-S2 no murmur or gallop, Pulmonary ;clear bilaterally, Abdomen ;soft nontender nondistended bowel sounds are active there is no palpable  mass or hepatosplenomegaly, Rectal; exam not done, EXt; no clubbing cyanosis or edema skin warm dry, Psych ;mood and affect appropriate        Assessment & Plan:  #25  68 year old female due for colon neoplasia surveillance last procedure 2005 normal colon. #2 chronic antiplatelet therapy with Plavix #3 insulin-dependent diabetic with insulin pump #4 coronary artery disease status post stent 2009 #5 history of atrial flutter status post ablation  Plan; patient is scheduled for colonoscopy with Dr. Delfin Edis on 07/08/2014. Procedure discussed in detail with patient and she is agreeable to proceed We have already received consent per Dr.Nasher for her to hold Plavix for 5-7 days prior to her procedure. She took her dose today and will hold further Plavix until post colonoscopy. She will continue on aspirin 1 daily. We have also contacted Dr. Ronnie Derby office for advice regarding adjustment of her insulin pump.peri- procedure

## 2014-07-02 NOTE — Progress Notes (Signed)
Reviewed and agree. Insulin adjustments, Plavix will be on hold.

## 2014-07-04 ENCOUNTER — Telehealth: Payer: Self-pay | Admitting: Physician Assistant

## 2014-07-04 ENCOUNTER — Telehealth: Payer: Self-pay | Admitting: Endocrinology

## 2014-07-04 NOTE — Telephone Encounter (Signed)
She can continue covering her carbohydrates the boluses but reduce her basal rate during the day by 0.2 throughout the day the day before and the day of her procedure

## 2014-07-04 NOTE — Telephone Encounter (Signed)
I called again and asked to speak to [facility]. I left another message with Colletta Maryland.

## 2014-07-04 NOTE — Telephone Encounter (Signed)
Please see below and advise.

## 2014-07-04 NOTE — Telephone Encounter (Signed)
Pt's procedure colonoscopy is Friday what are the insulin pump instructions

## 2014-07-05 NOTE — Telephone Encounter (Signed)
I advised the patient that she will have to remove the red nail polish on her hands for the procedure. We have to put a pulseox machine on her finger during the procedure and it reacts to the red nail polish. She said she will remove it.

## 2014-07-05 NOTE — Telephone Encounter (Signed)
Suanne Marker called the patient yesterday, 8-31 to go over her insulin pump instructions for the procedure on Friday 07-08-2014.

## 2014-07-07 ENCOUNTER — Telehealth: Payer: Self-pay | Admitting: Endocrinology

## 2014-07-07 NOTE — Telephone Encounter (Signed)
Please call pt asap before we leave today to be sure she will be able to regulate the blood sugars while prepping for her colonoscopy tomorrow AM

## 2014-07-08 ENCOUNTER — Encounter: Payer: Self-pay | Admitting: Internal Medicine

## 2014-07-08 ENCOUNTER — Ambulatory Visit (AMBULATORY_SURGERY_CENTER): Payer: Medicare Other | Admitting: Internal Medicine

## 2014-07-08 VITALS — BP 145/62 | HR 70 | Temp 97.4°F | Resp 17 | Ht 69.0 in | Wt 209.0 lb

## 2014-07-08 DIAGNOSIS — F79 Unspecified intellectual disabilities: Secondary | ICD-10-CM | POA: Diagnosis not present

## 2014-07-08 DIAGNOSIS — I2510 Atherosclerotic heart disease of native coronary artery without angina pectoris: Secondary | ICD-10-CM | POA: Diagnosis not present

## 2014-07-08 DIAGNOSIS — I4892 Unspecified atrial flutter: Secondary | ICD-10-CM | POA: Diagnosis not present

## 2014-07-08 DIAGNOSIS — Z1211 Encounter for screening for malignant neoplasm of colon: Secondary | ICD-10-CM | POA: Diagnosis not present

## 2014-07-08 DIAGNOSIS — E119 Type 2 diabetes mellitus without complications: Secondary | ICD-10-CM | POA: Diagnosis not present

## 2014-07-08 LAB — GLUCOSE, CAPILLARY
GLUCOSE-CAPILLARY: 41 mg/dL — AB (ref 70–99)
Glucose-Capillary: 157 mg/dL — ABNORMAL HIGH (ref 70–99)
Glucose-Capillary: 37 mg/dL — CL (ref 70–99)
Glucose-Capillary: 128 mg/dL — ABNORMAL HIGH (ref 70–99)

## 2014-07-08 MED ORDER — SODIUM CHLORIDE 0.9 % IV SOLN: 500.0000 mL | INTRAVENOUS | Status: DC

## 2014-07-08 NOTE — Progress Notes (Signed)
1213 blood sugar rechecked, 37. Insulin pump suspended, D5W hung, apple juice given. Patient asymptomatic. 1221Two glucose tablets given.

## 2014-07-08 NOTE — Progress Notes (Signed)
1239blood sugar 128. Patient reactivated her pump. IV d/c. Patient alert, asymptomatic. Patient will eat on arrival home.

## 2014-07-08 NOTE — Progress Notes (Signed)
Report to PACU, RN, vss, BBS= Clear.  

## 2014-07-08 NOTE — Patient Instructions (Addendum)
YOU HAD AN ENDOSCOPIC PROCEDURE TODAY AT THE [facility]: Refer to the procedure report that was given to you for any specific questions about what was found during the examination.  If the procedure report does not answer your questions, please call your gastroenterologist to clarify.  If you requested that your care partner not be given the details of your procedure findings, then the procedure report has been included in a sealed envelope for you to review at your convenience later.  YOU SHOULD EXPECT: Some feelings of bloating in the abdomen. Passage of more gas than usual.  Walking can help get rid of the air that was put into your GI tract during the procedure and reduce the bloating. If you had a lower endoscopy (such as a colonoscopy or flexible sigmoidoscopy) you may notice spotting of blood in your stool or on the toilet paper. If you underwent a bowel prep for your procedure, then you may not have a normal bowel movement for a few days.  DIET: Your first meal following the procedure should be a light meal and then it is ok to progress to your normal diet.  A half-sandwich or bowl of soup is an example of a good first meal.  Heavy or fried foods are harder to digest and may make you feel nauseous or bloated.  Likewise meals heavy in dairy and vegetables can cause extra gas to form and this can also increase the bloating.  Drink plenty of fluids but you should avoid alcoholic beverages for 24 hours.  ACTIVITY: Your care partner should take you home directly after the procedure.  You should plan to take it easy, moving slowly for the rest of the day.  You can resume normal activity the day after the procedure however you should NOT DRIVE or use heavy machinery for 24 hours (because of the sedation medicines used during the test).    SYMPTOMS TO REPORT IMMEDIATELY: A gastroenterologist can be reached at any hour.  During normal business hours, 8:30 AM to 5:00 PM Monday through Friday,  call [phone].  After hours and on weekends, please call the GI answering service at [phone] who will take a message and have the physician on call contact you.   Following lower endoscopy (colonoscopy or flexible sigmoidoscopy):  Excessive amounts of blood in the stool  Significant tenderness or worsening of abdominal pains  Swelling of the abdomen that is new, acute  Fever of 100F or higher    FOLLOW UP: If any biopsies were taken you will be contacted by phone or by letter within the next 1-3 weeks.  Call your gastroenterologist if you have not heard about the biopsies in 3 weeks.  Our staff will call the home number listed on your records the next business day following your procedure to check on you and address any questions or concerns that you may have at that time regarding the information given to you following your procedure. This is a courtesy call and so if there is no answer at the home number and we have not heard from you through the emergency physician on call, we will assume that you have returned to your regular daily activities without incident.  SIGNATURES/CONFIDENTIALITY: You and/or your care partner have signed paperwork which will be entered into your electronic medical record.  These signatures attest to the fact that that the information above on your After Visit Summary has been reviewed and is understood.  Full responsibility of the confidentiality   of this discharge information lies with you and/or your care-partner.   Information on diverticulosis & high fiber diet given to you toiday  Ok to restart Plavix today or tomorrow per Dr. Olevia Perches

## 2014-07-08 NOTE — Op Note (Signed)
[facility]  [address] [place], [address]   COLONOSCOPY PROCEDURE REPORT  PATIENT: [name], [name].  MR#: [id number] BIRTHDATE: Oct 08, 1946 , 68  yrs. old GENDER: Female ENDOSCOPIST: Lafayette Dragon, MD REFERRED YY:QMGNOIB Asa Lente, M.D. PROCEDURE DATE:  07/08/2014 PROCEDURE:   Colonoscopy, screening First Screening Colonoscopy - Avg.  risk and is 50 yrs.  old or older - No.  Prior Negative Screening - Now for repeat screening. 10 or more years since last screening  History of Adenoma - Now for follow-up colonoscopy & has been > or = to 3 yrs.  N/A  Polyps Removed Today? No.  Recommend repeat exam, <10 yrs? No. ASA CLASS:   Class II INDICATIONS:Average risk patient for colon cancer and prior colonoscopy in August 2005 was a normal exam. MEDICATIONS: MAC sedation, administered by CRNA and propofol (Diprivan) 300mg  IV  DESCRIPTION OF PROCEDURE:   After the risks benefits and alternatives of the procedure were thoroughly explained, informed consent was obtained.  A digital rectal exam revealed no abnormalities of the rectum.   The [id number]  endoscope was introduced through the anus and advanced to the cecum, which was identified by the ileocecal valve. No adverse events experienced.   The quality of the prep was good, using MoviPrep The instrument was then slowly withdrawn as the colon was fully examined.      COLON FINDINGS: There was mild scattered diverticulosis noted in the ascending colon.  Retroflexed views revealed no abnormalities. The time to cecum=12 minutes 17 seconds.  Withdrawal time=6 minutes 15 seconds.  The scope was withdrawn and the procedure completed. COMPLICATIONS: There were no complications.  ENDOSCOPIC IMPRESSION: There was mild diverticulosis noted in the ascending colon  RECOMMENDATIONS: 5 fiber diet Recall colonoscopy in 10 years   eSigned:  Lafayette Dragon, MD 07/08/2014 11:45 AM   cc:

## 2014-07-12 ENCOUNTER — Telehealth: Payer: Self-pay

## 2014-07-12 ENCOUNTER — Other Ambulatory Visit (INDEPENDENT_AMBULATORY_CARE_PROVIDER_SITE_OTHER): Payer: Medicare Other

## 2014-07-12 DIAGNOSIS — IMO0002 Reserved for concepts with insufficient information to code with codable children: Secondary | ICD-10-CM

## 2014-07-12 DIAGNOSIS — E1065 Type 1 diabetes mellitus with hyperglycemia: Secondary | ICD-10-CM | POA: Diagnosis not present

## 2014-07-12 LAB — COMPREHENSIVE METABOLIC PANEL
ALBUMIN: 3.8 g/dL (ref 3.5–5.2)
ALT: 36 U/L — ABNORMAL HIGH (ref 0–35)
AST: 32 U/L (ref 0–37)
Alkaline Phosphatase: 95 U/L (ref 39–117)
BUN: 16 mg/dL (ref 6–23)
CO2: 28 mEq/L (ref 19–32)
Calcium: 9.7 mg/dL (ref 8.4–10.5)
Chloride: 103 mEq/L (ref 96–112)
Creatinine, Ser: 0.8 mg/dL (ref 0.4–1.2)
GFR: 80.78 mL/min (ref >60.00)
Glucose, Bld: 123 mg/dL — ABNORMAL HIGH (ref 70–99)
POTASSIUM: 4.2 meq/L (ref 3.5–5.1)
SODIUM: 138 meq/L (ref 135–145)
Total Bilirubin: 0.7 mg/dL (ref 0.2–1.2)
Total Protein: 7.4 g/dL (ref 6.0–8.3)

## 2014-07-12 LAB — HEMOGLOBIN A1C: Hgb A1c MFr Bld: 7.2 % — ABNORMAL HIGH (ref 4.6–6.5)

## 2014-07-12 NOTE — Telephone Encounter (Signed)
  Follow up Call-  Call back number 07/08/2014  Post procedure Call Back phone  # [phone]  Permission to leave phone message Yes     Patient questions:  Do you have a fever, pain , or abdominal swelling? No. Pain Score  0 *  Have you tolerated food without any problems? Yes.    Have you been able to return to your normal activities? Yes.    Do you have any questions about your discharge instructions: Diet   No. Medications  No. Follow up visit  No.  Do you have questions or concerns about your Care? No.  Actions: * If pain score is 4 or above: No action needed, pain <4.

## 2014-07-15 ENCOUNTER — Encounter: Payer: Self-pay | Admitting: Endocrinology

## 2014-07-15 ENCOUNTER — Ambulatory Visit (INDEPENDENT_AMBULATORY_CARE_PROVIDER_SITE_OTHER): Payer: Medicare Other | Admitting: Endocrinology

## 2014-07-15 VITALS — BP 124/72 | HR 80 | Temp 98.0°F | Resp 16 | Ht 69.0 in | Wt 207.6 lb

## 2014-07-15 DIAGNOSIS — IMO0002 Reserved for concepts with insufficient information to code with codable children: Secondary | ICD-10-CM

## 2014-07-15 DIAGNOSIS — E1065 Type 1 diabetes mellitus with hyperglycemia: Secondary | ICD-10-CM

## 2014-07-15 DIAGNOSIS — I2510 Atherosclerotic heart disease of native coronary artery without angina pectoris: Secondary | ICD-10-CM | POA: Diagnosis not present

## 2014-07-15 DIAGNOSIS — E782 Mixed hyperlipidemia: Secondary | ICD-10-CM | POA: Diagnosis not present

## 2014-07-15 NOTE — Progress Notes (Signed)
Patient ID: [name], female   DOB: 01/30/1946, 68 y.o.   MRN: [id number]   Reason for visit: DIABETES followup.   HISTORY of present illness:  Diagnosis: Type 1 diabetes mellitus, date of diagnosis: 1979.   Insulin Pump: CURRENT brand:  Ping The pump settings are Basal rate: Midnight = 1.7. 2 AM = 1 1.3;5 AM = 1.8, 9 AM = 2.0, 12 noon = 1.3 and 5 PM = 2.7, 11 PM = 2.0   Carbohydrate ratio 1:9 lunch and 1:5 at dinner; sensitivity 1:30, 25 after 5 p.m.; target 120  Insulin on board indicator on, duration 4 hrs,   DIABETES: She has had long-standing diabetes with A1c levels usually above target. She had relatively high readings in 2013.  In 10/13 ACTOS was restarted because of her large insulin requirement and diabetic dyslipidemia.  Her blood sugars improved significantly and basal rates were reduced. Her best A1c was 6.9.   RECENT history:  She has been using the continuous glucose sensor again although she is not happy with the frequent alarms and modifications Although she previously had been able to prevent hypoglycemia she is having nocturnal hypoglycemia recently and has not been taking action consistently when blood sugar is dropping She also had difficulties with hypoglycemia while preparing for colonoscopy and after the procedure even though her basal rate was reduced by 0.2 throughout the day Current blood sugar patterns:  Fairly good fasting readings  Tendency higher readings after breakfast even though she is usually having some kind of protein  Occasional high readings late in the evening but she cannot pinpoint what foods make this go up  Occasional very high readings in the evenings and overnight which are not corrected in time and may stay persistently high  Tendency to over correction occasionally; this may be partly from her taking a meal bolus on top of a correction bolus without checking her sugar. Her sensitivity is 25 in the evenings, not clear why  Blood  sugars are fairly good before meals throughout the day  Variable response to boluses at meal times at different meals and not clear if she is doing appropriate carbohydrate coverage since she does not enter her carbohydrate grams consistently COMPLIANCE with boluses has been fairly good although rarely will still forget; has 3.4 boluses per day.  She is again not entering carbohydrates in the pump for her meals and not sure if she is following the pump settings for carb ratios  Monitors blood glucose: on an average 5.4 times a day according to pump download.  Blood Glucose readings on her monitor:   PREMEAL Breakfast Lunch Dinner Bedtime Overall  Glucose range:  49-246   58-215   65-274   104-341    Mean/median:  122   122   234   186   167     CGM RECORD INTERPRETATION    Dates of Recording: 07/09/14 through 07/15/14     Sensor  summary:  Quality of the data is excellent with adequate sensor function. Glucose excursion profile shows 42 % of readings in the high range and 20 % low with overall average 137, standard deviation 77. On an average the highest blood sugar is 202 at 10 PM and lowest average is 104 at 6 AM.  Compared to previous visit overall average is the same but has more variability      Glycemic patterns:   most significant patterns of high present between 10 PM-1 AM and significant pattern of lows  between 1 AM-4 AM      Overnight periods:   she appears to have normal to low blood sugars on most of the nights and high readings only on 9/7 and 9/9; the high reading on 9/9 was a continuation of hyperglycemia from the previous evening Her most significant hypoglycemia was last night which was prolonged between about 1:30 AM-4:30 AM and also transient hypoglycemia on 9/10 and 9/5      Preprandial periods:   her sugars are fairly good before meals with an average of 112 at 8 AM, 147 at 1 PM and 130 at 6 PM which is her usual suppertime      Postprandial periods:  blood sugars  after breakfast are only mostly higher except on a couple of days, fairly level after lunch but  occasionally  higher after evening meal. blood sugar increased most significantly on 9/699/8 in the evening but she does not remember what she had for dinner       Hypoglycemia:  fairly significant overnight hypoglycemia especially last night which was prolonged. Also transient hypoglycemia possibly related to over correction on 9/9      Recommendations  increase basal rate after 6-7 PM and reduce basal rate between 2-5 AM     Hypoglycemic awareness: Has symptoms of feeling tired, headache, sweaty. Variable recognition threshold present  Sometimes may not be aware during the night and will be notified by her sensor that the sugar is low  She has symptoms when blood glucose is less than 60. Uses glucose tablets for treatment when not at home but otherwise eats a snack .   Meals: 3 meals per day.  DIET has been relatively inconsistent overall and her mealtimes are variable. Frequently will not eat breakfast.  Usually trying to have low fat meals  Food preferences: Yogurt And cottage cheese for breakfast usually.  Physical activity: exercise: 45 min occ in ams. Historically after exercise blood sugar will get lower in 1-2 hrs. .  Certified Diabetes Educator visit: Most recent: 8/12.   She is usually regular with her eye exams  Wt Readings from Last 3 Encounters:  07/15/14 207 lb 9.6 oz (94.167 kg)  07/08/14 209 lb (94.802 kg)  07/01/14 209 lb (94.802 kg)    LABS:  Lab Results  Component Value Date   HGBA1C 7.2* 07/12/2014   HGBA1C 6.9* 04/19/2014   HGBA1C 7.2* 01/13/2014   Lab Results  Component Value Date   MICROALBUR 0.2 04/19/2014   LDLCALC 58 01/13/2014   CREATININE 0.8 07/12/2014    Appointment on 07/12/2014  Component Date Value Ref Range Status  . Hemoglobin A1C 07/12/2014 7.2* 4.6 - 6.5 % Final   Glycemic Control Guidelines for People with Diabetes:Non Diabetic:  <6%Goal of  Therapy: <7%Additional Action Suggested:  >8%   . Sodium 07/12/2014 138  135 - 145 mEq/L Final  . Potassium 07/12/2014 4.2  3.5 - 5.1 mEq/L Final  . Chloride 07/12/2014 103  96 - 112 mEq/L Final  . CO2 07/12/2014 28  19 - 32 mEq/L Final  . Glucose, Bld 07/12/2014 123* 70 - 99 mg/dL Final  . BUN 07/12/2014 16  6 - 23 mg/dL Final  . Creatinine, Ser 07/12/2014 0.8  0.4 - 1.2 mg/dL Final  . Total Bilirubin 07/12/2014 0.7  0.2 - 1.2 mg/dL Final  . Alkaline Phosphatase 07/12/2014 95  39 - 117 U/L Final  . AST 07/12/2014 32  0 - 37 U/L Final  . ALT 07/12/2014 36* 0 - 35  U/L Final  . Total Protein 07/12/2014 7.4  6.0 - 8.3 g/dL Final  . Albumin 07/12/2014 3.8  3.5 - 5.2 g/dL Final  . Calcium 07/12/2014 9.7  8.4 - 10.5 mg/dL Final  . GFR 07/12/2014 80.78  >60.00 mL/min Final      Medication List       This list is accurate as of: 07/15/14  9:07 AM.  Always use your most recent med list.               acetaminophen 650 MG CR tablet  Commonly known as:  TYLENOL  Take 650 mg by mouth every 8 (eight) hours as needed for pain.     CALCIUM + D PO  Take 600 mg by mouth 2 (two) times daily.     clopidogrel 75 MG tablet  Commonly known as:  PLAVIX  Take 1 tablet by mouth  daily     CRESTOR 40 MG tablet  Generic drug:  rosuvastatin  Take 1 tablet by mouth  daily     ibuprofen 200 MG tablet  Commonly known as:  ADVIL,MOTRIN  Take 200 mg by mouth every 6 (six) hours as needed.     insulin lispro 100 UNIT/ML injection  Commonly known as:  HUMALOG  Use max 200 units per day with insulin pump     insulin pump Soln  Inject into the skin. Novolog Vial, max 200 units per day with pump     metFORMIN 500 MG 24 hr tablet  Commonly known as:  GLUCOPHAGE-XR  Take 2 tablets daily     metoprolol succinate 100 MG 24 hr tablet  Commonly known as:  TOPROL-XL  Take 1 tablet (100 mg total) by mouth daily.     niacin 1000 MG CR tablet  Commonly known as:  NIASPAN  Take 1 tablet (1,000 mg  total) by mouth at bedtime.     nitroGLYCERIN 0.4 MG SL tablet  Commonly known as:  NITROSTAT  Place 1 tablet (0.4 mg total) under the tongue every 5 (five) minutes as needed.     pioglitazone 15 MG tablet  Commonly known as:  ACTOS  Take 1 tablet (15 mg total) by mouth daily.     VITAMIN D PO  Take 1,000 mg by mouth daily.        Allergies:  Allergies  Allergen Reactions  . Septra [Bactrim] Rash  . Sulfa Antibiotics Rash    Past Medical History  Diagnosis Date  . Coronary artery disease     2009 LAD Promus stent  . Hyperlipidemia   . Osteopenia   . Arthritis   . Type 1 diabetes mellitus on insulin therapy   . Fibroid   . Atrial flutter     a. s/p TEE/DCCV 08/16/13 (normal LV function, no LAA thrombus).b. s/p ablation by Dr Lovena Le 09-23-2013  . Valvular heart disease     a. Mild MR/TR by TEE 08/2013.    Past Surgical History  Procedure Laterality Date  . Coronary angioplasty with stent placement    . Pelvic laparoscopy    . Myomectomy  1977  . Appendectomy    . Cataract extraction Bilateral   . Tee without cardioversion N/A 08/16/2013    Procedure: TRANSESOPHAGEAL ECHOCARDIOGRAM (TEE);  Surgeon: Thayer Headings, MD;  Location: [facility];  Service: Cardiovascular;  Laterality: N/A;  . Cardioversion N/A 08/16/2013    Procedure: CARDIOVERSION;  Surgeon: Thayer Headings, MD;  Location: [facility];  Service: Cardiovascular;  Laterality: N/A;  spoke with [name]  . Ablation  09-23-2013    CTI by Dr Lovena Le  . Colonoscopy      Family History  Problem Relation Age of Onset  . Atrial fibrillation Mother   . Diabetes Mother   . Hypertension Mother   . CAD Father 43    died age 51  . CAD Brother 78    small vessel disease  . CAD Cousin     paternal  . CAD Paternal Grandfather     early onset  . Colon cancer Neg Hx     Social History:  reports that she has never smoked. She has never used smokeless tobacco. She reports that she drinks about 1.2 ounces of  alcohol per week. She reports that she does not use illicit drugs.  REVIEW of systems:  She has a history of coronary disease but no recent chest pain  She has had diabetic dyslipidemia with significantly high LDL particle number and this has been managed with Crestor and Niaspan   In 9/14 her lipids were about the best they have been in a while with LDL 70, particle number near 1000 and particle size normal 6   Lab Results  Component Value Date   CHOL 126 01/13/2014   HDL 58.40 01/13/2014   LDLCALC 58 01/13/2014   TRIG 50.0 01/13/2014   CHOLHDL 2 01/13/2014   No significant hypertension and has had atrial arrhythmias treated with metoprolol She had atrial fibrillation ablation done  Has history of arthritis  BP 124/72  Pulse 80  Temp(Src) 98 F (36.7 C)  Resp 16  Ht 5\' 9"  (1.753 m)  Wt 207 lb 9.6 oz (94.167 kg)  BMI 30.64 kg/m2  SpO2 96%   ASSESSMENT:   DIABETES type 1 with fair control, A1c is reasonably good at 7.2  See history of present illness for detailed discussion of her current blood sugar patterns and management with her pump and interpretation of her continuous glucose sensor regarding Her main problems appear to be tending to get low overnight and periodic postprandial hyperglycemia Also  at times she is getting over a correction of her high readings and rebound to low sugars Some of her high readings are from inconsistent diet and bolusing inadequately She will need to reduce insulin basal rates during the night   She is wanting to continue using the sensor but discussed that she needs to take action when the blood sugars trending higher or lower and confirm the blood sugars with fingersticks  Hyperlipidemia: Since she has had a history of high LDL particle number and CAD, has been under good control.  To continue niacin in addition to Crestor and Actos  PLAN:   Basal rate at midnight = 1.55 and at 2 AM = 1.05 Carbohydrate coverage at breakfast one:  8 Sensitivity 1:35 overnight and 1:30 the rest of the day Take extra boluses for high readings whenever the blood sugar is going up Used the pump to calculate correction doses especially when there is insulin on board Take action when blood sugars trending lower and treat low sugars early with glucose tablets Consider suspending insulin pump temporarily when blood sugar is significantly low  Counseling time over 50% of today's 25 minute visit  Lj Miyamoto

## 2014-08-11 ENCOUNTER — Encounter: Payer: Self-pay | Admitting: Internal Medicine

## 2014-08-11 ENCOUNTER — Ambulatory Visit (INDEPENDENT_AMBULATORY_CARE_PROVIDER_SITE_OTHER): Payer: Medicare Other | Admitting: Internal Medicine

## 2014-08-11 ENCOUNTER — Telehealth: Payer: Self-pay | Admitting: *Deleted

## 2014-08-11 VITALS — BP 122/64 | HR 66 | Temp 97.7°F | Ht 69.0 in | Wt 206.8 lb

## 2014-08-11 DIAGNOSIS — E1065 Type 1 diabetes mellitus with hyperglycemia: Secondary | ICD-10-CM

## 2014-08-11 DIAGNOSIS — K209 Esophagitis, unspecified without bleeding: Secondary | ICD-10-CM

## 2014-08-11 DIAGNOSIS — I2510 Atherosclerotic heart disease of native coronary artery without angina pectoris: Secondary | ICD-10-CM

## 2014-08-11 DIAGNOSIS — Z23 Encounter for immunization: Secondary | ICD-10-CM | POA: Diagnosis not present

## 2014-08-11 DIAGNOSIS — IMO0002 Reserved for concepts with insufficient information to code with codable children: Secondary | ICD-10-CM

## 2014-08-11 MED ORDER — OMEPRAZOLE 20 MG PO CPDR: 20.0000 mg | DELAYED_RELEASE_CAPSULE | Freq: Two times a day (BID) | ORAL | Status: DC

## 2014-08-11 NOTE — Progress Notes (Signed)
Pre visit review using our clinic review tool, if applicable. No additional management support is needed unless otherwise documented below in the visit note. 

## 2014-08-11 NOTE — Patient Instructions (Addendum)
It was good to see you today.  We have reviewed your prior records including labs and tests today  Test(s) ordered today. Your results will be released to [place] (or called to you) after review, usually within 72hours after test completion. If any changes need to be made, you will be notified at that same time.  Medications reviewed and updated Use generic prilosec twice daily for 2 weeks to help esophagitis symptoms - then once daily x 2 weeks then stop IF symptoms better - if NOT better, call for refer to Dr Olevia Perches as needed Your prescription(s) have been submitted to your pharmacy. Please take as directed and contact our office if you believe you are having problem(s) with the medication(s). No other changes recommended at this time. Refill on medication(s) as discussed today.  follow up with Dr Tamala Julian for heel pain as discussed  Please schedule followup in 3-4 months, call sooner if problems.Esophagitis Esophagitis is inflammation of the esophagus. It can involve swelling, soreness, and pain in the esophagus. This condition can make it difficult and painful to swallow. CAUSES  Most causes of esophagitis are not serious. Many different factors can cause esophagitis, including:  Gastroesophageal reflux disease (GERD). This is when acid from your stomach flows up into the esophagus.  Recurrent vomiting.  An allergic-type reaction.  Certain medicines, especially those that come in large pills.  Ingestion of harmful chemicals, such as household cleaning products.  Heavy alcohol use.  An infection of the esophagus.  Radiation treatment for cancer.  Certain diseases such as sarcoidosis, Crohn's disease, and scleroderma. These diseases may cause recurrent esophagitis. SYMPTOMS   Trouble swallowing.  Painful swallowing.  Chest pain.  Difficulty breathing.  Nausea.  Vomiting.  Abdominal pain. DIAGNOSIS  Your caregiver will take your history and do a physical exam.  Depending upon what your caregiver finds, certain tests may also be done, including:  Barium X-ray. You will drink a solution that coats the esophagus, and X-rays will be taken.  Endoscopy. A lighted tube is put down the esophagus so your caregiver can examine the area.  Allergy tests. These can sometimes be arranged through follow-up visits. TREATMENT  Treatment will depend on the cause of your esophagitis. In some cases, steroids or other medicines may be given to help relieve your symptoms or to treat the underlying cause of your condition. Medicines that may be recommended include:  Viscous lidocaine, to soothe the esophagus.  Antacids.  Acid reducers.  Proton pump inhibitors.  Antiviral medicines for certain viral infections of the esophagus.  Antifungal medicines for certain fungal infections of the esophagus.  Antibiotic medicines, depending on the cause of the esophagitis. HOME CARE INSTRUCTIONS   Avoid foods and drinks that seem to make your symptoms worse.  Eat small, frequent meals instead of large meals.  Avoid eating for the 3 hours prior to your bedtime.  If you have trouble taking pills, use a pill splitter to decrease the size and likelihood of the pill getting stuck or injuring the esophagus on the way down. Drinking water after taking a pill also helps.  Stop smoking if you smoke.  Maintain a healthy weight.  Wear loose-fitting clothing. Do not wear anything tight around your waist that causes pressure on your stomach.  Raise the head of your bed 6 to 8 inches with wood blocks to help you sleep. Extra pillows will not help.  Only take over-the-counter or prescription medicines as directed by your caregiver. SEEK IMMEDIATE MEDICAL CARE IF:  You have severe chest pain that radiates into your arm, neck, or jaw.  You feel sweaty, dizzy, or lightheaded.  You have shortness of breath.  You vomit blood.  You have difficulty or pain with  swallowing.  You have bloody or black, tarry stools.  You have a fever.  You have a burning sensation in the chest more than 3 times a week for more than 2 weeks.  You cannot swallow, drink, or eat.  You drool because you cannot swallow your saliva. MAKE SURE YOU:  Understand these instructions.  Will watch your condition.  Will get help right away if you are not doing well or get worse. Document Released: 11/28/2004 Document Revised: 01/13/2012 Document Reviewed: 06/21/2011 [facility] Patient Information 2015 [place], [place]. This information is not intended to replace advice given to you by your health care provider. Make sure you discuss any questions you have with your health care provider.

## 2014-08-11 NOTE — Assessment & Plan Note (Signed)
Follows with endo [name] for same The current medical regimen is effective;  continue present plan and medications. Lab Results  Component Value Date   HGBA1C 7.2* 07/12/2014

## 2014-08-11 NOTE — Progress Notes (Signed)
Subjective:    Patient ID: [name], female    DOB: 1946/08/27, 68 y.o.   MRN: [id number]  HPI  Patient is here for follow up  Reviewed chronic medical issues and interval medical events  Past Medical History  Diagnosis Date  . Coronary artery disease     2009 LAD Promus stent  . Hyperlipidemia   . Osteopenia   . Arthritis   . Type 1 diabetes mellitus on insulin therapy   . Fibroid   . Atrial flutter     a. s/p TEE/DCCV 08/16/13 (normal LV function, no LAA thrombus).b. s/p ablation by Dr Lovena Le 09-23-2013  . Valvular heart disease     a. Mild MR/TR by TEE 08/2013.    Review of Systems  Constitutional: Negative for fever, fatigue and unexpected weight change.  HENT: Positive for trouble swallowing ("pressure feeling" with swallow x 3-4 weeks, no pain, no regurgittion).   Respiratory: Negative for cough and shortness of breath.   Cardiovascular: Negative for chest pain and leg swelling.  Gastrointestinal: Negative for nausea, vomiting, abdominal pain and abdominal distention.       Objective:   Physical Exam  BP 122/64  Pulse 66  Temp(Src) 97.7 F (36.5 C) (Oral)  Ht 5\' 9"  (1.753 m)  Wt 206 lb 12 oz (93.781 kg)  BMI 30.52 kg/m2  SpO2 95% Wt Readings from Last 3 Encounters:  08/11/14 206 lb 12 oz (93.781 kg)  07/15/14 207 lb 9.6 oz (94.167 kg)  07/08/14 209 lb (94.802 kg)   Constitutional: She appears well-developed and well-nourished. No distress.  HENT: OP clear without ulceration or oral candidiasis Neck: Normal range of motion. Neck supple. No JVD present. No thyromegaly present.  Cardiovascular: Normal rate, regular rhythm and normal heart sounds.  No murmur heard. No BLE edema. Pulmonary/Chest: Effort normal and breath sounds normal. No respiratory distress. She has no wheezes.  MSKel: see foot exam - L heel without gross abn, min tender over PF at heel insertion Psychiatric: She has a normal mood and affect. Her behavior is normal. Judgment and  thought content normal.   Lab Results  Component Value Date   WBC 5.2 09/16/2013   HGB 14.6 09/16/2013   HCT 42.8 09/16/2013   PLT 266.0 09/16/2013   GLUCOSE 123* 07/12/2014   CHOL 126 01/13/2014   TRIG 50.0 01/13/2014   HDL 58.40 01/13/2014   LDLCALC 58 01/13/2014   ALT 36* 07/12/2014   AST 32 07/12/2014   NA 138 07/12/2014   K 4.2 07/12/2014   CL 103 07/12/2014   CREATININE 0.8 07/12/2014   BUN 16 07/12/2014   CO2 28 07/12/2014   TSH 1.684 08/15/2013   INR 1.11 09/23/2013   HGBA1C 7.2* 07/12/2014   MICROALBUR 0.2 04/19/2014    No results found.     Assessment & Plan:   Acute esophagitis. Likely pill-induced from large size metformin and calcium. No red flags such as vomiting, pain, regurgitation, or weight loss. Treat empirically PPI x2 weeks, and then wean down but symptoms improved. Patient will call if unimproved for referral to GI, sooner if worse. Patient education provided with reassurance and instructions on when to call if needed  Left heel pain x3 months. Facet plantar fasciitis. Referred to sports medicine  Problem List Items Addressed This Visit   Uncontrolled type 1 diabetes mellitus      Follows with endo [name] for same The current medical regimen is effective;  continue present plan and medications. Lab Results  Component Value Date   HGBA1C 7.2* 07/12/2014       Other Visit Diagnoses   Acute esophagitis    -  Primary    Relevant Orders       CBC with Differential    Need for prophylactic vaccination and inoculation against influenza        Relevant Orders       Flu Vaccine QUAD 36+ mos PF IM (Fluarix Quad PF) (Completed)

## 2014-08-17 ENCOUNTER — Encounter: Payer: Self-pay | Admitting: Family Medicine

## 2014-08-17 ENCOUNTER — Ambulatory Visit (INDEPENDENT_AMBULATORY_CARE_PROVIDER_SITE_OTHER): Payer: Medicare Other | Admitting: Family Medicine

## 2014-08-17 ENCOUNTER — Other Ambulatory Visit (INDEPENDENT_AMBULATORY_CARE_PROVIDER_SITE_OTHER): Payer: Medicare Other

## 2014-08-17 VITALS — BP 130/64 | HR 77 | Ht 69.5 in | Wt 207.0 lb

## 2014-08-17 DIAGNOSIS — M79672 Pain in left foot: Secondary | ICD-10-CM | POA: Diagnosis not present

## 2014-08-17 DIAGNOSIS — M722 Plantar fascial fibromatosis: Secondary | ICD-10-CM

## 2014-08-17 DIAGNOSIS — I2510 Atherosclerotic heart disease of native coronary artery without angina pectoris: Secondary | ICD-10-CM | POA: Diagnosis not present

## 2014-08-17 NOTE — Patient Instructions (Signed)
Very nice to meet you Cold water bath of foot nightly.  Exercises in handout 3 times a week Daily get on stair and drop heels as far as they can go, then up on toes, hold 2 seconds down slow for count of 4 seconds.  Repeat 30 reps daily.  Good shoes-  Bronwen Betters, Dansko or New balance greater then 700.  Vitamin D 2000 IU daily.  Turmeric 500mg  twice daily Come back in 3 weeks

## 2014-08-17 NOTE — Assessment & Plan Note (Signed)
Discussed with patient at great length. We did get patient handout swelling about the diagnosis as well as the likely prognosis and rehabilitation. Patient showed proper technique of multiple different exercises today. We discussed the importance of shoe choices and rigid soled shoes that can be more beneficial. We discussed an icing protocol as well as given a trial of topical anti-inflammatories. I do not want to use these on a long-term basis secondary to patient's coronary artery disease. We will avoid oral anti-inflammatories if we can. We discussed about natural supplementation second be beneficial as well. Patient will try these interventions and come back again in 3 weeks. If continuing to have pain unfortunately formal physical therapy or ultrasound-guided injection may be necessary.

## 2014-08-17 NOTE — Progress Notes (Signed)
[name] [place], [place] [address] Phone: [phone] Subjective:    I'm seeing this patient by the request  of:  Gwendolyn Grant, MD   CC: Left heel pain  [id number] [name] is a 68 y.o. female coming in with complaint of left heel pain.  Patient states that she's had this for approximately 5 months. Patient remembers over the summer she started having some mild discomfort of the heel. Now states that it is worse when she wakes up in the morning. Better when she walks. Hurts as well after sitting for long amount of time. Seems to be localized to the heel area mostly. Has not noticed any differences with different shoes. Has only tried some Tylenol with minimal benefit. Patient is still able to do activities of daily living. Denies any numbness or radiation into the foot and denies any weakness. Races severity of 4/10. States though that it feels like it is getting worse. Denies any nighttime awakening. No injury that she can think of.     Past medical history, social, surgical and family history all reviewed in electronic medical record.   Review of Systems: No headache, visual changes, nausea, vomiting, diarrhea, constipation, dizziness, abdominal pain, skin rash, fevers, chills, night sweats, weight loss, swollen lymph nodes, body aches, joint swelling, muscle aches, chest pain, shortness of breath, mood changes.   Objective Blood pressure 130/64, pulse 77, height 5' 9.5" (1.765 m), weight 207 lb (93.895 kg), SpO2 96.00%.  General: No apparent distress alert and oriented x3 mood and affect normal, dressed appropriately.  HEENT: Pupils equal, extraocular movements intact  Respiratory: Patient's speak in full sentences and does not appear short of breath  Cardiovascular: No lower extremity edema, non tender, no erythema  Skin: Warm dry intact with no signs of infection or rash on extremities or on axial skeleton.  Abdomen: Soft  nontender  Neuro: Cranial nerves II through XII are intact, neurovascularly intact in all extremities with 2+ DTRs and 2+ pulses.  Lymph: No lymphadenopathy of posterior or anterior cervical chain or axillae bilaterally.  Gait normal with good balance and coordination.  MSK:  Non tender with full range of motion and good stability and symmetric strength and tone of shoulders, elbows, wrist, hip, knee and ankles bilaterally.  Normal inspection with no visable or palpable fat pad atrophy and no visible swelling/erythema.  Patient is tender at medial insertion of plantar fascia into calcaneus. Great toe motion: Mild hallux limitus Arch shape: Mild break in the transverse and longitudinal arch Other foot breakdown:   MSK US performed of: Left foot This study was ordered, performed, and interpreted by Charlann Boxer D.O.  Foot/Ankle:   All structures visualized.   Talar dome unremarkable  Ankle mortise without effusion. Peroneus longus and brevis tendons unremarkable on long and transverse views without sheath effusions. Posterior tibialis, flexor hallucis longus, and flexor digitorum longus tendons unremarkable on long and transverse views without sheath effusions. Achilles tendon visualized along length of tendon and unremarkable on long and transverse views without sheath effusion. Anterior Talofibular Ligament and Calcaneofibular Ligaments unremarkable and intact. Deltoid Ligament unremarkable and intact. Patient the fascia does have significant hypoechoic changes. It does measure 1.29 cm which is double the size it should be for this individual. Power doppler signal normal.  IMPRESSION:  Plantar fasciitis of the left    Impression and Recommendations:     This case required medical decision making of moderate complexity.

## 2014-08-25 NOTE — Telephone Encounter (Signed)
error 

## 2014-08-31 ENCOUNTER — Telehealth: Payer: Self-pay | Admitting: Internal Medicine

## 2014-08-31 NOTE — Telephone Encounter (Signed)
Pt called stated she scheduled for a mammogram next week; pt was wondering about the new guide line that just came up stated that if pt does not have a history of cancer then they can get mammogram done every other year. Pt was wondering what Dr. Asa Lente think because she spoke with [facility] and they are going by the old guideline (which is getting it done every year). Please advise.

## 2014-09-01 NOTE — Telephone Encounter (Signed)
Spoke to pt and gave MD response.

## 2014-09-01 NOTE — Telephone Encounter (Signed)
There are several factors which influence risk for breast cancer and the guideline for every other year have been there for a few years but was recently updated. It is safe for you to not get a mammogram this year or at least wait until you can discuss it with Dr. Asa Lente as long as you have no history of a biopsy of your breast, do not have family history of breast cancer, have not noticed any bumps or lumps in your breast. Dr. Doug Sou

## 2014-09-05 ENCOUNTER — Encounter: Payer: Self-pay | Admitting: Family Medicine

## 2014-09-08 ENCOUNTER — Encounter: Payer: Self-pay | Admitting: Family Medicine

## 2014-09-08 ENCOUNTER — Other Ambulatory Visit (INDEPENDENT_AMBULATORY_CARE_PROVIDER_SITE_OTHER): Payer: Medicare Other

## 2014-09-08 ENCOUNTER — Ambulatory Visit (INDEPENDENT_AMBULATORY_CARE_PROVIDER_SITE_OTHER): Payer: Medicare Other | Admitting: Family Medicine

## 2014-09-08 VITALS — BP 140/78 | HR 70 | Ht 69.5 in | Wt 210.0 lb

## 2014-09-08 DIAGNOSIS — M722 Plantar fascial fibromatosis: Secondary | ICD-10-CM | POA: Diagnosis not present

## 2014-09-08 DIAGNOSIS — I2510 Atherosclerotic heart disease of native coronary artery without angina pectoris: Secondary | ICD-10-CM | POA: Diagnosis not present

## 2014-09-08 DIAGNOSIS — Z1231 Encounter for screening mammogram for malignant neoplasm of breast: Secondary | ICD-10-CM | POA: Diagnosis not present

## 2014-09-08 LAB — HM MAMMOGRAPHY

## 2014-09-08 NOTE — Progress Notes (Signed)
  [name] [place], [place] [address] Phone: [phone] Subjective:     CC: Left heel pain follow up  OAC:[id number] [name] is a 68 y.o. female coming in with complaint of left heel pain. The patient was seen 3 weeks ago and was diagnosed with plantar fasciitis. Patient had significant swelling on ultrasound. Patient was given conservative therapy with icing protocol, home exercise, and we discussed proper shoe wear. Patient states she is a proximal money 90% better. Patient has been doing the icing as well as a home exercises religiously. Patient states that she does not have the pain in the morning any more. We has the pain after sitting a long amount of time and getting up. No new symptoms. Very happy with results.      Past medical history, social, surgical and family history all reviewed in electronic medical record.   Review of Systems: No headache, visual changes, nausea, vomiting, diarrhea, constipation, dizziness, abdominal pain, skin rash, fevers, chills, night sweats, weight loss, swollen lymph nodes, body aches, joint swelling, muscle aches, chest pain, shortness of breath, mood changes.   Objective Blood pressure 140/78, pulse 70, height 5' 9.5" (1.765 m), weight 210 lb (95.255 kg), SpO2 97 %.  General: No apparent distress alert and oriented x3 mood and affect normal, dressed appropriately.  HEENT: Pupils equal, extraocular movements intact  Respiratory: Patient's speak in full sentences and does not appear short of breath  Cardiovascular: No lower extremity edema, non tender, no erythema  Skin: Warm dry intact with no signs of infection or rash on extremities or on axial skeleton.  Abdomen: Soft nontender  Neuro: Cranial nerves II through XII are intact, neurovascularly intact in all extremities with 2+ DTRs and 2+ pulses.  Lymph: No lymphadenopathy of posterior or anterior cervical chain or axillae bilaterally.  Gait  normal with good balance and coordination.  MSK:  Non tender with full range of motion and good stability and symmetric strength and tone of shoulders, elbows, wrist, hip, knee and ankles bilaterally.  Normal inspection with no visable or palpable fat pad atrophy and no visible swelling/erythema.  Patient is tender at medial insertion of plantar fascia into calcaneus but less then previous exam. Great toe motion: Mild hallux limitus Arch shape: Mild break in the transverse and longitudinal arch   MSK US performed of: Left foot This study was ordered, performed, and interpreted by Charlann Boxer D.O.  Foot/Ankle:   All structures visualized.   Talar dome unremarkable  Ankle mortise without effusion. Peroneus longus and brevis tendons unremarkable on long and transverse views without sheath effusions. Posterior tibialis, flexor hallucis longus, and flexor digitorum longus tendons unremarkable on long and transverse views without sheath effusions. Achilles tendon visualized along length of tendon and unremarkable on long and transverse views without sheath effusion. Anterior Talofibular Ligament and Calcaneofibular Ligaments unremarkable and intact. Deltoid Ligament unremarkable and intact. Patient the fascia does have significant hypoechoic changes. It does measure 0.89 down from 1.29 cm   IMPRESSION:  Plantar fasciitis of the left with improvement.     Impression and Recommendations:     This case required medical decision making of moderate complexity.

## 2014-09-08 NOTE — Patient Instructions (Signed)
Give yourself a pat on the back you are doing great! Continue icing at the end of the day Rigid sole shoes would be better.  Avoid being barefoot at home.  Continue the exercises for another 6 weeks Continue the vitamin D and the turmeric See me again in 6 weeks.

## 2014-09-08 NOTE — Assessment & Plan Note (Signed)
Patient is doing well at this time. We discussed continuing the exercises as well as the icing. Patient was given phase II exercises for strengthening mostly of the posterior capsule. We discussed other possible treatment options but with patient making such improvement we'll make no significant changes at this time. We discussed proper shoes that they could be beneficial and help with rest the pain. Patient and will follow up and see me again in 6 weeks for further evaluation and treatment.  Spent greater than 25 minutes with patient face-to-face and had greater than 50% of counseling including as described above in assessment and plan.

## 2014-09-12 ENCOUNTER — Ambulatory Visit (INDEPENDENT_AMBULATORY_CARE_PROVIDER_SITE_OTHER): Payer: Medicare Other | Admitting: Cardiovascular Disease

## 2014-09-12 ENCOUNTER — Encounter: Payer: Self-pay | Admitting: Cardiovascular Disease

## 2014-09-12 VITALS — BP 140/80 | HR 76 | Ht 69.5 in | Wt 208.8 lb

## 2014-09-12 DIAGNOSIS — I2510 Atherosclerotic heart disease of native coronary artery without angina pectoris: Secondary | ICD-10-CM

## 2014-09-12 DIAGNOSIS — E785 Hyperlipidemia, unspecified: Secondary | ICD-10-CM

## 2014-09-12 DIAGNOSIS — I4892 Unspecified atrial flutter: Secondary | ICD-10-CM | POA: Diagnosis not present

## 2014-09-12 NOTE — Patient Instructions (Signed)
Your physician recommends that you continue on your current medications as directed. Please refer to the Current Medication list given to you today.  Your physician wants you to follow-up in: 6 months with Dr. [name].  You will receive a reminder letter in the mail two months in advance. If you don't receive a letter, please call our office to schedule the follow-up appointment.  

## 2014-09-12 NOTE — Assessment & Plan Note (Signed)
[name] is doing well. She's not had any episodes of angina. I have encouraged her to exercise on a regular basis. Her lipid levels have been well controlled. Continue same medications.

## 2014-09-12 NOTE — Assessment & Plan Note (Signed)
No Episodes of atrial flutter. We'll check an EKG next visit.

## 2014-09-12 NOTE — Progress Notes (Signed)
Marianna Payment Date of Birth  [birth year] [place] [address]    [place]   [place], [place]  [address]    [place], [place]  [address] [phone]  Fax  [phone]  [phone]  Fax [phone]   Problems. 1. CAD 2. Atrial Flutter 3. Diabetes Mellitus.   History of Present Illness:  [name] is doing very well. She's not having episodes of chest pain or shortness of breath. She's been able to below of her normal activities without any significant problems. She has not had chest pains .  Her insurance will run out the month before she goes on Medicare.   She has not had any CP and no palpitations.    April 08, 2013:   Emmylou is doing well. No CP.  Rhythm is stable.     Mar 11, 2014:  Norvell is doing well.  She had a atrial flutter ablation several months ago.  She has not been taking her metoprolol..  Nov. 9, 2015:  Roshanda is doing well. No Cp , no dyspnea.  Active, not exerciseing as much as she would like.   Current Outpatient Prescriptions on File Prior to Visit  Medication Sig Dispense Refill  . Calcium Carbonate-Vitamin D (CALCIUM + D PO) Take 600 mg by mouth 2 (two) times daily.     . Cholecalciferol (VITAMIN D PO) Take 1,000 mg by mouth daily.     . clopidogrel (PLAVIX) 75 MG tablet Take 1 tablet by mouth  daily 90 tablet 1  . CRESTOR 40 MG tablet Take 1 tablet by mouth  daily 90 tablet 1  . ibuprofen (ADVIL,MOTRIN) 200 MG tablet Take 200 mg by mouth every 6 (six) hours as needed.    . Insulin Aspart (INSULIN PUMP) 100 unit/ml SOLN Inject into the skin. Novolog Vial, max 200 units per day with pump    . insulin lispro (HUMALOG) 100 UNIT/ML injection Use max 200 units per day with insulin pump 9 vial 2  . metFORMIN (GLUCOPHAGE-XR) 500 MG 24 hr tablet Take 2 tablets daily 180 tablet 1  . metoprolol succinate (TOPROL-XL) 100 MG 24 hr tablet Take 1 tablet (100 mg total) by mouth daily. 90 tablet 3  . niacin  (NIASPAN) 1000 MG CR tablet Take 1 tablet (1,000 mg total) by mouth at bedtime. 90 tablet 3  . nitroGLYCERIN (NITROSTAT) 0.4 MG SL tablet Place 1 tablet (0.4 mg total) under the tongue every 5 (five) minutes as needed. 25 tablet 11  . omeprazole (PRILOSEC) 20 MG capsule Take 1 capsule (20 mg total) by mouth 2 (two) times daily before a meal. 60 capsule 1  . pioglitazone (ACTOS) 15 MG tablet Take 1 tablet (15 mg total) by mouth daily. 90 tablet 3   No current facility-administered medications on file prior to visit.    Allergies  Allergen Reactions  . Septra [Bactrim] Rash  . Sulfa Antibiotics Rash    Past Medical History  Diagnosis Date  . Coronary artery disease     2009 LAD Promus stent  . Hyperlipidemia   . Osteopenia   . Arthritis   . Type 1 diabetes mellitus on insulin therapy   . Fibroid   . Atrial flutter     a. s/p TEE/DCCV 08/16/13 (normal LV function, no LAA thrombus).b. s/p ablation by Dr Lovena Le 09-23-2013  . Valvular heart disease     a. Mild MR/TR by TEE 08/2013.  Past Surgical History  Procedure Laterality Date  . Coronary angioplasty with stent placement    . Pelvic laparoscopy    . Myomectomy  1977  . Appendectomy    . Cataract extraction Bilateral   . Tee without cardioversion N/A 08/16/2013    Procedure: TRANSESOPHAGEAL ECHOCARDIOGRAM (TEE);  Surgeon: Thayer Headings, MD;  Location: [facility];  Service: Cardiovascular;  Laterality: N/A;  . Cardioversion N/A 08/16/2013    Procedure: CARDIOVERSION;  Surgeon: Thayer Headings, MD;  Location: [facility];  Service: Cardiovascular;  Laterality: N/A;  spoke with [name]  . Ablation  09-23-2013    CTI by Dr Lovena Le  . Colonoscopy      History  Smoking status  . Never Smoker   Smokeless tobacco  . Never Used    History  Alcohol Use  . 1.2 oz/week  . 2 Glasses of wine per week    Family History  Problem Relation Age of Onset  . Atrial fibrillation Mother   . Diabetes Mother   . Hypertension  Mother   . CAD Father 73    died age 46  . CAD Brother 25    small vessel disease  . CAD Cousin     paternal  . CAD Paternal Grandfather     early onset  . Colon cancer Neg Hx     Reviw of Systems:  Reviewed in the HPI.  All other systems are negative.  Physical Exam: Blood pressure 140/80, pulse 76, height 5' 9.5" (1.765 m), weight 208 lb 12.8 oz (94.711 kg). General: Well developed, well nourished, in no acute distress.  Head: Normocephalic, atraumatic, sclera non-icteric, mucus membranes are moist,   Neck: Supple. Negative for carotid bruits. JVD not elevated.  Lungs: Clear bilaterally to auscultation without wheezes, rales, or rhonchi. Breathing is unlabored.  Heart: RRR with S1 S2. No murmurs, rubs, or gallops appreciated.  Abdomen: Soft, non-tender, non-distended with normoactive bowel sounds. No hepatomegaly. No rebound/guarding. No obvious abdominal masses.  Msk:  Strength and tone appear normal for age.  Extremities: No clubbing or cyanosis. No edema.  Distal pedal pulses are 2+ and equal bilaterally.  Neuro: Alert and oriented X 3. Moves all extremities spontaneously.  Psych:  Responds to questions appropriately with a normal affect.  ECG: April 08, 2013:  NSR at 83. No ST or T wave changes.  Assessment / Plan:

## 2014-09-23 ENCOUNTER — Encounter: Payer: Self-pay | Admitting: Internal Medicine

## 2014-09-28 ENCOUNTER — Encounter: Payer: Medicare Other | Admitting: Internal Medicine

## 2014-10-10 ENCOUNTER — Other Ambulatory Visit: Payer: Self-pay | Admitting: Endocrinology

## 2014-10-11 ENCOUNTER — Other Ambulatory Visit: Payer: Medicare Other

## 2014-10-13 ENCOUNTER — Encounter (HOSPITAL_COMMUNITY): Payer: Self-pay | Admitting: Internal Medicine

## 2014-10-13 ENCOUNTER — Other Ambulatory Visit (INDEPENDENT_AMBULATORY_CARE_PROVIDER_SITE_OTHER): Payer: Medicare Other

## 2014-10-13 DIAGNOSIS — E1065 Type 1 diabetes mellitus with hyperglycemia: Secondary | ICD-10-CM

## 2014-10-13 DIAGNOSIS — E785 Hyperlipidemia, unspecified: Secondary | ICD-10-CM

## 2014-10-13 DIAGNOSIS — IMO0002 Reserved for concepts with insufficient information to code with codable children: Secondary | ICD-10-CM

## 2014-10-13 DIAGNOSIS — E782 Mixed hyperlipidemia: Secondary | ICD-10-CM

## 2014-10-13 LAB — LIPID PANEL
CHOL/HDL RATIO: 3
Cholesterol: 147 mg/dL (ref 0–200)
HDL: 55.5 mg/dL (ref >39.00)
LDL Cholesterol: 53 mg/dL (ref 0–99)
NonHDL: 91.5
Triglycerides: 192 mg/dL — ABNORMAL HIGH (ref 0.0–149.0)
VLDL: 38.4 mg/dL (ref 0.0–40.0)

## 2014-10-13 LAB — HEMOGLOBIN A1C: HEMOGLOBIN A1C: 7 % — AB (ref 4.6–6.5)

## 2014-10-13 LAB — LDL CHOLESTEROL, DIRECT: Direct LDL: 73.1 mg/dL

## 2014-10-14 ENCOUNTER — Ambulatory Visit: Payer: Medicare Other | Admitting: Endocrinology

## 2014-10-17 ENCOUNTER — Ambulatory Visit (INDEPENDENT_AMBULATORY_CARE_PROVIDER_SITE_OTHER): Payer: Medicare Other | Admitting: Endocrinology

## 2014-10-17 ENCOUNTER — Encounter: Payer: Self-pay | Admitting: Endocrinology

## 2014-10-17 VITALS — BP 124/62 | HR 79 | Temp 98.2°F | Resp 14 | Ht 69.5 in | Wt 211.0 lb

## 2014-10-17 DIAGNOSIS — I2510 Atherosclerotic heart disease of native coronary artery without angina pectoris: Secondary | ICD-10-CM | POA: Diagnosis not present

## 2014-10-17 DIAGNOSIS — E1065 Type 1 diabetes mellitus with hyperglycemia: Secondary | ICD-10-CM | POA: Diagnosis not present

## 2014-10-17 DIAGNOSIS — E782 Mixed hyperlipidemia: Secondary | ICD-10-CM | POA: Diagnosis not present

## 2014-10-17 DIAGNOSIS — IMO0002 Reserved for concepts with insufficient information to code with codable children: Secondary | ICD-10-CM

## 2014-10-17 NOTE — Progress Notes (Signed)
Patient ID: [name], female   DOB: 10/30/1946, 68 y.o.   MRN: [id number]   Reason for visit: DIABETES followup.   HISTORY of present illness:  Diagnosis: Type 1 diabetes mellitus, date of diagnosis: 1979.   Insulin Pump: CURRENT brand:  Ping The pump settings are Basal rate: Midnight = 1.7. 2 AM = 1 1.3;5 AM = 1.8, 9 AM = 2.0, 12 noon = 1.3 and 5 PM = 2.7, 11 PM = 2.0   Carbohydrate ratio 1:9 lunch and 1:5 at dinner; sensitivity 1:30, 25 after 5 p.m.; target 120  Insulin on board indicator on, duration 4 hrs,   DIABETES: She has had long-standing diabetes with A1c levels usually above target. She had relatively high readings in 2013.  In 10/13 ACTOS was restarted because of her large insulin requirement and diabetic dyslipidemia.  Her blood sugars improved significantly and basal rates were reduced. Her best A1c was 6.9.   RECENT history:  She has been using the continuous glucose sensor and is now somewhat more used to this She thinks it is usually lagging behind upper 30 minutes from her actual blood sugar but has been helpful in warning her about low and high blood sugars  Current blood sugar patterns and problems identified:  Usually has good fasting readings, has only one low sugar documented  Blood sugars may be relatively lower after breakfast and on some days including today it may start going down even without the breakfast bolus; however she is generally eating a small amount of carbohydrate only in the morning  She is not using the correct carbohydrate coverage for breakfast and lunch and is using a 1:6 factor instead of 1:9 which is programmed on her pump; usually not entering carbohydrate in the pump  She is standing to have low blood sugars midday and early afternoon, sometimes before lunch  Recently overall her highest blood sugars may be in the early evening and early morning although not consistent  Blood sugars are overall the lowest between 8-9 AM and 11 AM-1  PM as seen on her continuous sensor  Blood sugars after her evening meals are usually fairly good  She may tend to drop low right after going to sleep even with a good blood sugar at bedtime   She thinks that she is using a dual wave for foods like pasta even though it and to raise her blood sugar more quickly  She is not doing any formal exercise COMPLIANCE with boluses has been fairly good although because of tendency to low sugars she may sometimes skip the bolus, not many boluses entered at lunch lately  Monitors blood glucose: on an average 4.5 times a day according to pump download.  Blood Glucose readings on her monitor:   PRE-MEAL Breakfast Lunch Dinner Bedtime Overall  Glucose range:  60-250   69-272   56-300   77-325   56-325   Mean/median:      123+/-71     CGM RECORD INTERPRETATION    Dates of Recording: 10/11/14 through 10/17/14          Sensor  summary:   overall average glucose is recently 108+/-40, range <40 up to 309.  Has 54% within target, 22% high and 24% low readings     Glycemic patterns:  Glucose excursion profile shows most significant patterns of lows between 1 PM-1:30 PM but no patterns of hyperglycemia      Overnight periods:   blood sugars tending to decline after midnight  with significant hypoglycemia on 10/10 early during the night.  Did have significant high readings starting at 1-2 AM on 12/13 Has had fairly good blood sugars on the other days and between 3 AM and 6 AM otherwise      Preprandial periods:   blood sugars are fairly close to normal at breakfast, averaging about 110.  Blood sugar at noon his averaging 100-105 and around dinnertime 130-136      Postprandial periods:   blood sugars are flat to lower after breakfast and lunch and generally flat after her evening meal      Hypoglycemia:  occurring periodically at 1 AM, 9 AM and 1 PM with most significant low sugars at about 1 PM.  No hypoglycemia after 6 PM       Hypoglycemic awareness:  Has symptoms of feeling tired, headache, sweaty. Variable recognition threshold present  Sometimes may not be aware during the night and will be notified by her sensor that the sugar is low  She has symptoms when blood glucose is less than 60. Uses glucose tablets for treatment when not at home but otherwise eats a snack .   Meals: 3 meals per day.  DIET has been relatively inconsistent overall and her mealtimes are variable. Frequently will not eat breakfast.  Usually trying to have low fat meals  Food preferences: Yogurt And cottage cheese for breakfast usually.  Physical activity: exercise: 45 min occ in ams. Historically after exercise blood sugar will get lower in 1-2 hrs. .  Certified Diabetes Educator visit: Most recent: 8/12.   She is usually regular with her eye exams  Wt Readings from Last 3 Encounters:  10/17/14 211 lb (95.709 kg)  09/12/14 208 lb 12.8 oz (94.711 kg)  09/08/14 210 lb (95.255 kg)    LABS:  Lab Results  Component Value Date   HGBA1C 7.0* 10/13/2014   HGBA1C 7.2* 07/12/2014   HGBA1C 6.9* 04/19/2014   Lab Results  Component Value Date   MICROALBUR 0.2 04/19/2014   LDLCALC 53 10/13/2014   CREATININE 0.8 07/12/2014    Appointment on 10/13/2014  Component Date Value Ref Range Status  . Hgb A1c MFr Bld 10/13/2014 7.0* 4.6 - 6.5 % Final   Glycemic Control Guidelines for People with Diabetes:Non Diabetic:  <6%Goal of Therapy: <7%Additional Action Suggested:  >8%   . Cholesterol 10/13/2014 147  0 - 200 mg/dL Final   ATP III Classification       Desirable:  < 200 mg/dL               Borderline High:  200 - 239 mg/dL          High:  > = 240 mg/dL  . Triglycerides 10/13/2014 192.0* 0.0 - 149.0 mg/dL Final   Normal:  <150 mg/dLBorderline High:  150 - 199 mg/dL  . HDL 10/13/2014 55.50  >39.00 mg/dL Final  . VLDL 10/13/2014 38.4  0.0 - 40.0 mg/dL Final  . LDL Cholesterol 10/13/2014 53  0 - 99 mg/dL Final  . Total CHOL/HDL Ratio 10/13/2014 3   Final                   Men          Women1/2 Average Risk     3.4          3.3Average Risk          5.0          4.42X Average Risk  9.6          7.13X Average Risk          15.0          11.0                      . NonHDL 10/13/2014 91.50   Final   NOTE:  Non-HDL goal should be 30 mg/dL higher than patient's LDL goal (i.e. LDL goal of < 70 mg/dL, would have non-HDL goal of < 100 mg/dL)  . Direct LDL 10/13/2014 73.1   Final   Optimal:  <100 mg/dLNear or Above Optimal:  100-129 mg/dLBorderline High:  130-159 mg/dLHigh:  160-189 mg/dLVery High:  >190 mg/dL      Medication List       This list is accurate as of: 10/17/14  3:28 PM.  Always use your most recent med list.               CALCIUM + D PO  Take 600 mg by mouth 2 (two) times daily.     clopidogrel 75 MG tablet  Commonly known as:  PLAVIX  Take 1 tablet by mouth  daily     CRESTOR 40 MG tablet  Generic drug:  rosuvastatin  Take 1 tablet by mouth  daily     doxycycline 100 MG capsule  Commonly known as:  VIBRAMYCIN  as needed.     ibuprofen 200 MG tablet  Commonly known as:  ADVIL,MOTRIN  Take 200 mg by mouth every 6 (six) hours as needed.     insulin lispro 100 UNIT/ML injection  Commonly known as:  HUMALOG  Use max 200 units per day with insulin pump     insulin pump Soln  Inject into the skin. Novolog Vial, max 200 units per day with pump     metFORMIN 500 MG 24 hr tablet  Commonly known as:  GLUCOPHAGE-XR  Take 2 tablets daily     metoprolol succinate 100 MG 24 hr tablet  Commonly known as:  TOPROL-XL  Take 1 tablet (100 mg total) by mouth daily.     niacin 1000 MG CR tablet  Commonly known as:  NIASPAN  Take 1 tablet (1,000 mg total) by mouth at bedtime.     nitroGLYCERIN 0.4 MG SL tablet  Commonly known as:  NITROSTAT  Place 1 tablet (0.4 mg total) under the tongue every 5 (five) minutes as needed.     omeprazole 20 MG capsule  Commonly known as:  PRILOSEC  Take 1 capsule (20 mg total) by mouth 2 (two)  times daily before a meal.     pioglitazone 15 MG tablet  Commonly known as:  ACTOS  Take 1 tablet (15 mg total) by mouth daily.     VITAMIN D PO  Take 1,000 mg by mouth daily.        Allergies:  Allergies  Allergen Reactions  . Septra [Bactrim] Rash  . Sulfa Antibiotics Rash    Past Medical History  Diagnosis Date  . Coronary artery disease     2009 LAD Promus stent  . Hyperlipidemia   . Osteopenia   . Arthritis   . Type 1 diabetes mellitus on insulin therapy   . Fibroid   . Atrial flutter     a. s/p TEE/DCCV 08/16/13 (normal LV function, no LAA thrombus).b. s/p ablation by Dr Lovena Le 09-23-2013  . Valvular heart disease     a. Mild MR/TR by TEE 08/2013.  Past Surgical History  Procedure Laterality Date  . Coronary angioplasty with stent placement    . Pelvic laparoscopy    . Myomectomy  1977  . Appendectomy    . Cataract extraction Bilateral   . Tee without cardioversion N/A 08/16/2013    Procedure: TRANSESOPHAGEAL ECHOCARDIOGRAM (TEE);  Surgeon: Thayer Headings, MD;  Location: [place];  Service: Cardiovascular;  Laterality: N/A;  . Cardioversion N/A 08/16/2013    Procedure: CARDIOVERSION;  Surgeon: Thayer Headings, MD;  Location: [facility];  Service: Cardiovascular;  Laterality: N/A;  spoke with [name]  . Ablation  09-23-2013    CTI by Dr Lovena Le  . Colonoscopy    . Atrial flutter ablation N/A 09/23/2013    Procedure: ATRIAL FLUTTER ABLATION;  Surgeon: Evans Lance, MD;  Location: [facility] CATH LAB;  Service: Cardiovascular;  Laterality: N/A;    Family History  Problem Relation Age of Onset  . Atrial fibrillation Mother   . Diabetes Mother   . Hypertension Mother   . CAD Father 37    died age 7  . CAD Brother 72    small vessel disease  . CAD Cousin     paternal  . CAD Paternal Grandfather     early onset  . Colon cancer Neg Hx     Social History:  reports that she has never smoked. She has never used smokeless tobacco. She reports that she  drinks about 1.2 oz of alcohol per week. She reports that she does not use illicit drugs.  REVIEW of systems:  She has a history of coronary disease but no recent chest pain  She has had diabetic dyslipidemia with significantly high LDL particle number and this has been managed with Crestor and Niaspan   In 6/15 her particle number was still slightly above 1000 and particle size normal    Lab Results  Component Value Date   CHOL 147 10/13/2014   HDL 55.50 10/13/2014   LDLCALC 53 10/13/2014   LDLDIRECT 73.1 10/13/2014   TRIG 192.0* 10/13/2014   CHOLHDL 3 10/13/2014   No significant hypertension and has had atrial arrhythmias treated with metoprolol She had atrial fibrillation ablation done  Has history of arthritis  BP 124/62 mmHg  Pulse 79  Temp(Src) 98.2 F (36.8 C)  Resp 14  Ht 5' 9.5" (1.765 m)  Wt 211 lb (95.709 kg)  BMI 30.72 kg/m2  SpO2 94%   ASSESSMENT/PLAN:  DIABETES type 1 with fair control, A1c is reasonably good at 7.0 given the difficulty of her diabetes control  See history of present illness for detailed discussion of her current blood sugar patterns and management with her pump and interpretation of her continuous glucose sensor Her main problems appear to be tending to get low glucose levels late morning, midday and early afternoon She is tending to get low sugars with mealtime boluses at breakfast and lunch although she is having only small amounts of insulin given at those times She appears to be getting excessive basal rate right after midnight, around 8-10 AM and also around midday Also have discussed how to bolus for high glycemic index meals with trying to bolus 10-15 minutes before eating and also may need extra insulin. She does need to reduce her carbohydrate coverage at breakfast and lunch as she is by mistake using 1:6 ratio insulin of 1:9 Changes in her basal rates will be as follows Midnight = 1.4, 2 AM = 1.0, 8 AM = 1.85 and 12 noon =  1.2 Also encouraged her to start formal exercise program after the holidays  Hyperlipidemia:  LDL is near 70.  She also has a history of high LDL particle number which has been under good control.  To continue niacin in addition to Crestor and Actos for benefits on particle size and HDL  Counseling time over 50% of today's 25 minute visit  Nathan Stallworth

## 2014-10-20 ENCOUNTER — Encounter: Payer: Self-pay | Admitting: Family Medicine

## 2014-10-20 ENCOUNTER — Ambulatory Visit (INDEPENDENT_AMBULATORY_CARE_PROVIDER_SITE_OTHER): Payer: Medicare Other | Admitting: Family Medicine

## 2014-10-20 VITALS — BP 120/72 | HR 93 | Ht 69.5 in | Wt 205.0 lb

## 2014-10-20 DIAGNOSIS — M722 Plantar fascial fibromatosis: Secondary | ICD-10-CM | POA: Diagnosis not present

## 2014-10-20 DIAGNOSIS — I2510 Atherosclerotic heart disease of native coronary artery without angina pectoris: Secondary | ICD-10-CM

## 2014-10-20 NOTE — Assessment & Plan Note (Signed)
Patient is doing significant really better at this time. Continue to do conservative therapy. If patient has any worsening symptoms she'll come back for further evaluation. Otherwise patient can follow-up with me on an as-needed basis.

## 2014-10-20 NOTE — Patient Instructions (Addendum)
Thank you so much for the kind words.  Ice is still your friend.  Continue the exercises 3 times a weeks for 6 weeks.  See me again when you need me.  Happy holidays!

## 2014-10-20 NOTE — Progress Notes (Signed)
  [name] [address] [place], [place] [address] Phone: [phone] Subjective:     CC: Left heel pain follow up  [name] is a 68 y.o. female coming in with complaint of left heel pain. Has dx of PF.  Doing well, no pain really. Doing much better in new shoes. No new symptoms,   Able to do all activities at this time.      Past medical history, social, surgical and family history all reviewed in electronic medical record.   Review of Systems: No headache, visual changes, nausea, vomiting, diarrhea, constipation, dizziness, abdominal pain, skin rash, fevers, chills, night sweats, weight loss, swollen lymph nodes, body aches, joint swelling, muscle aches, chest pain, shortness of breath, mood changes.   Objective Blood pressure 120/72, pulse 93, height 5' 9.5" (1.765 m), weight 205 lb (92.987 kg), SpO2 97 %.  General: No apparent distress alert and oriented x3 mood and affect normal, dressed appropriately.  HEENT: Pupils equal, extraocular movements intact  Respiratory: Patient's speak in full sentences and does not appear short of breath  Cardiovascular: No lower extremity edema, non tender, no erythema  Skin: Warm dry intact with no signs of infection or rash on extremities or on axial skeleton.  Abdomen: Soft nontender  Neuro: Cranial nerves II through XII are intact, neurovascularly intact in all extremities with 2+ DTRs and 2+ pulses.  Lymph: No lymphadenopathy of posterior or anterior cervical chain or axillae bilaterally.  Gait normal with good balance and coordination.  MSK:  Non tender with full range of motion and good stability and symmetric strength and tone of shoulders, elbows, wrist, hip, knee and ankles bilaterally.  Normal inspection with no visable or palpable fat pad atrophy and no visible swelling/erythema.  Nontender over the medial aspect of the calcaneus  Great toe motion: Mild hallux limitus Arch shape:  Mild break in the transverse and longitudinal arch   MSK US performed of: Left foot This study was ordered, performed, and interpreted by Charlann Boxer D.O.  Foot/Ankle:   All structures visualized.   Talar dome unremarkable  Ankle mortise without effusion. Peroneus longus and brevis tendons unremarkable on long and transverse views without sheath effusions. Posterior tibialis, flexor hallucis longus, and flexor digitorum longus tendons unremarkable on long and transverse views without sheath effusions. Achilles tendon visualized along length of tendon and unremarkable on long and transverse views without sheath effusion. Anterior Talofibular Ligament and Calcaneofibular Ligaments unremarkable and intact. Deltoid Ligament unremarkable and intact. Patient the fascia does have significant hypoechoic changes. It does measure 0.82 down from 0.89 cm   IMPRESSION:  Plantar fasciitis of the left     Impression and Recommendations:     This case required medical decision making of moderate complexity.

## 2014-10-31 ENCOUNTER — Other Ambulatory Visit: Payer: Medicare Other

## 2014-10-31 ENCOUNTER — Other Ambulatory Visit: Payer: Self-pay | Admitting: Endocrinology

## 2014-11-02 ENCOUNTER — Ambulatory Visit: Payer: Medicare Other | Admitting: Endocrinology

## 2014-11-07 ENCOUNTER — Encounter: Payer: Self-pay | Admitting: Internal Medicine

## 2014-11-07 ENCOUNTER — Ambulatory Visit (INDEPENDENT_AMBULATORY_CARE_PROVIDER_SITE_OTHER): Payer: Medicare Other | Admitting: Internal Medicine

## 2014-11-07 VITALS — BP 136/80 | HR 67 | Temp 98.1°F | Resp 16 | Ht 69.5 in | Wt 211.0 lb

## 2014-11-07 DIAGNOSIS — Z23 Encounter for immunization: Secondary | ICD-10-CM | POA: Diagnosis not present

## 2014-11-07 DIAGNOSIS — Z0000 Encounter for general adult medical examination without abnormal findings: Secondary | ICD-10-CM

## 2014-11-07 DIAGNOSIS — E1065 Type 1 diabetes mellitus with hyperglycemia: Secondary | ICD-10-CM

## 2014-11-07 DIAGNOSIS — I25119 Atherosclerotic heart disease of native coronary artery with unspecified angina pectoris: Secondary | ICD-10-CM | POA: Diagnosis not present

## 2014-11-07 DIAGNOSIS — IMO0002 Reserved for concepts with insufficient information to code with codable children: Secondary | ICD-10-CM

## 2014-11-07 DIAGNOSIS — M8580 Other specified disorders of bone density and structure, unspecified site: Secondary | ICD-10-CM

## 2014-11-07 DIAGNOSIS — R0789 Other chest pain: Secondary | ICD-10-CM

## 2014-11-07 MED ORDER — PIOGLITAZONE HCL 15 MG PO TABS: 15.0000 mg | ORAL_TABLET | Freq: Every day | ORAL | Status: DC

## 2014-11-07 MED ORDER — PNEUMOCOCCAL 13-VAL CONJ VACC IM SUSP: 0.5000 mL | INTRAMUSCULAR | Status: DC

## 2014-11-07 MED ORDER — METFORMIN HCL ER 500 MG PO TB24: 1000.0000 mg | ORAL_TABLET | Freq: Every day | ORAL | Status: DC

## 2014-11-07 MED ORDER — OMEPRAZOLE 20 MG PO CPDR: 20.0000 mg | DELAYED_RELEASE_CAPSULE | Freq: Two times a day (BID) | ORAL | Status: DC | PRN

## 2014-11-07 MED ORDER — ROSUVASTATIN CALCIUM 40 MG PO TABS: 40.0000 mg | ORAL_TABLET | Freq: Every day | ORAL | Status: DC

## 2014-11-07 NOTE — Assessment & Plan Note (Signed)
Follows with endo [name] for same Insulin pump plus oral meds metformin and Actos Denies severe hypoglycemia The current medical regimen is effective;  continue present plan and medications. Lab Results  Component Value Date   HGBA1C 7.0* 10/13/2014

## 2014-11-07 NOTE — Progress Notes (Signed)
Subjective:    Patient ID: [name], female    DOB: 1946-01-25, 69 y.o.   MRN: [id number]  HPI   Here for medicare wellness  Diet: heart healthy, diabetic Physical activity: sedentary Depression/mood screen: negative Hearing: intact to whispered voice Visual acuity: grossly normal, performs annual eye exam  ADLs: capable Fall risk: none Home safety: good Cognitive evaluation: intact to orientation, naming, recall and repetition EOL planning: adv directives, full code/ I agree  I have personally reviewed and have noted 1. The patient's medical and social history 2. Their use of alcohol, tobacco or illicit drugs 3. Their current medications and supplements 4. The patient's functional ability including ADL's, fall risks, home safety risks and hearing or visual impairment. 5. Diet and physical activities 6. Evidence for depression or mood disorders  Also reviewed chronic medical issues and interval medical events  Past Medical History  Diagnosis Date  . Coronary artery disease     2009 LAD Promus stent  . Hyperlipidemia   . Osteopenia   . Arthritis   . Type 1 diabetes mellitus on insulin therapy   . Fibroid   . Atrial flutter     a. s/p TEE/DCCV 08/16/13 (normal LV function, no LAA thrombus).b. s/p ablation by Dr Lovena Le 09-23-2013  . Valvular heart disease     a. Mild MR/TR by TEE 08/2013.   Family History  Problem Relation Age of Onset  . Atrial fibrillation Mother   . Diabetes Mother   . Hypertension Mother   . CAD Father 11    died age 55  . CAD Brother 45    small vessel disease  . CAD Cousin     paternal  . CAD Paternal Grandfather     early onset  . Colon cancer Neg Hx     Review of Systems  Constitutional: Negative for fatigue and unexpected weight change.  HENT: Positive for ear pain ("itch" on L).   Respiratory: Positive for chest tightness (2-3x/week, not exertional, positional or meal related). Negative for cough, shortness of breath and  wheezing.   Cardiovascular: Negative for chest pain, palpitations and leg swelling.  Gastrointestinal: Negative for nausea, abdominal pain and diarrhea.  Neurological: Negative for dizziness, weakness, light-headedness and headaches.  Psychiatric/Behavioral: Negative for dysphoric mood. The patient is not nervous/anxious.   All other systems reviewed and are negative.      Objective:   Physical Exam  BP 136/80 mmHg  Pulse 67  Temp(Src) 98.1 F (36.7 C) (Oral)  Resp 16  Ht 5' 9.5" (1.765 m)  Wt 211 lb (95.709 kg)  BMI 30.72 kg/m2  SpO2 96% Wt Readings from Last 3 Encounters:  11/07/14 211 lb (95.709 kg)  10/20/14 205 lb (92.987 kg)  10/17/14 211 lb (95.709 kg)   Constitutional: She is overweight, appears well-developed and well-nourished. No distress.  HENT: Head: Normocephalic and atraumatic. Ears: Left side with cerumen impaction;R TM ok, no erythema or effusion; Nose: Nose normal. Mouth/Throat: Oropharynx is clear and moist. No oropharyngeal exudate.  Eyes: Conjunctivae and EOM are normal. Pupils are equal, round, and reactive to light. No scleral icterus.  Neck: Normal range of motion. Neck supple. No JVD present. No thyromegaly present.  Cardiovascular: Normal rate, regular rhythm and normal heart sounds.  No murmur heard. No BLE edema. Pulmonary/Chest: Effort normal and breath sounds normal. No respiratory distress. She has no wheezes.  Abdominal: Soft. Bowel sounds are normal. She exhibits no distension. There is no tenderness. no masses GU/breast: defer to  gyn Musculoskeletal: Normal range of motion, no joint effusions. No gross deformities Neurological: She is alert and oriented to person, place, and time. No cranial nerve deficit. Coordination, balance, strength, speech and gait are normal.  Skin: Skin is warm and dry. No rash noted. No erythema.  Psychiatric: She has a normal mood and affect. Her behavior is normal. Judgment and thought content normal.    Lab Results   Component Value Date   WBC 5.2 09/16/2013   HGB 14.6 09/16/2013   HCT 42.8 09/16/2013   PLT 266.0 09/16/2013   GLUCOSE 123* 07/12/2014   CHOL 147 10/13/2014   TRIG 192.0* 10/13/2014   HDL 55.50 10/13/2014   LDLDIRECT 73.1 10/13/2014   LDLCALC 53 10/13/2014   ALT 36* 07/12/2014   AST 32 07/12/2014   NA 138 07/12/2014   K 4.2 07/12/2014   CL 103 07/12/2014   CREATININE 0.8 07/12/2014   BUN 16 07/12/2014   CO2 28 07/12/2014   TSH 1.684 08/15/2013   INR 1.11 09/23/2013   HGBA1C 7.0* 10/13/2014   MICROALBUR 0.2 04/19/2014    No results found. ECG today: Normal sinus rhythm @ 67bpm, no ischemic change. No change from 11/09/2013      Assessment & Plan:   AWV/CPX - z00.00 - Today patient counseled on age appropriate routine health concerns for screening and prevention, each reviewed and up to date or declined. Immunizations reviewed and up to date or declined. Labs/ECG reviewed. Risk factors for depression reviewed and negative. Hearing function and visual acuity are intact. ADLs screened and addressed as needed. Functional ability and level of safety reviewed and appropriate. Education, counseling and referrals performed based on assessed risks today. Patient provided with a copy of personalized plan for preventive services.  Cerumen impaction. Left side with "itching" -declines irrigation today as out of time, but will schedule nurse visit and return for same as needed  Chest tightness. No history concerning for anginal event. ECG today without ischemic changes. Patient to call if persisting problems for referral for stress test as needed  Problem List Items Addressed This Visit    Coronary artery disease    S/p PTCA/stent 05/2008 No overt anginal symptoms -but episodic fleeting chest tightness, not exertional, positional formula related. ECG today, compared to January 2015 -no ischemic changes As patient begins exercise regimen with new year, she will contact us if exertional  chest pain or increasing symptoms for referral to stress test as needed The current medical regimen is effective;  continue present plan and medications.     Relevant Medications      aspirin 325 MG tablet   Osteopenia    DEXA previously followed by gyn - pt reports last done 2012 - schedule here now On Ca+D, WB exercises -  The current medical regimen is effective;  continue present plan and medications.       Uncontrolled type 1 diabetes mellitus    Follows with endo [name] for same Insulin pump plus oral meds metformin and Actos Denies severe hypoglycemia The current medical regimen is effective;  continue present plan and medications. Lab Results  Component Value Date   HGBA1C 7.0* 10/13/2014      Relevant Medications      aspirin 325 MG tablet    Other Visit Diagnoses    Routine general medical examination at a health care facility    -  Primary    Chest tightness

## 2014-11-07 NOTE — Assessment & Plan Note (Signed)
S/p PTCA/stent 05/2008 No overt anginal symptoms -but episodic fleeting chest tightness, not exertional, positional formula related. ECG today, compared to January 2015 -no ischemic changes As patient begins exercise regimen with new year, she will contact us if exertional chest pain or increasing symptoms for referral to stress test as needed The current medical regimen is effective;  continue present plan and medications.

## 2014-11-07 NOTE — Assessment & Plan Note (Signed)
DEXA previously followed by gyn - pt reports last done 2012 - schedule here now On Ca+D, WB exercises -  The current medical regimen is effective;  continue present plan and medications.

## 2014-11-07 NOTE — Patient Instructions (Addendum)
It was good to see you today.  We have reviewed your prior records including labs and tests today  Health Maintenance reviewed - Prevnar 13 immunization performed. Also will order bone density scan today. All other recommended immunizations and age-appropriate screenings are up-to-date.  Medications reviewed and updated, no changes recommended at this time.  Your ear was irrigated of wax today -let us know if continued hearing or "itch" problems persist for referral to audiologist and hearing testing  Please schedule followup in 12 months for annual exam and labs, call sooner if problems.  If persisting or increasing chest tightness problems, please call for referral for stress test as needed  Health Maintenance Adopting a healthy lifestyle and getting preventive care can go a long way to promote health and wellness. Talk with your health care provider about what schedule of regular examinations is right for you. This is a good chance for you to check in with your provider about disease prevention and staying healthy. In between checkups, there are plenty of things you can do on your own. Experts have done a lot of research about which lifestyle changes and preventive measures are most likely to keep you healthy. Ask your health care provider for more information. WEIGHT AND DIET  Eat a healthy diet  Be sure to include plenty of vegetables, fruits, low-fat dairy products, and lean protein.  Do not eat a lot of foods high in solid fats, added sugars, or salt.  Get regular exercise. This is one of the most important things you can do for your health.  Most adults should exercise for at least 150 minutes each week. The exercise should increase your heart rate and make you sweat (moderate-intensity exercise).  Most adults should also do strengthening exercises at least twice a week. This is in addition to the moderate-intensity exercise.  Maintain a healthy weight  Body mass index (BMI)  is a measurement that can be used to identify possible weight problems. It estimates body fat based on height and weight. Your health care provider can help determine your BMI and help you achieve or maintain a healthy weight.  For females 31 years of age and older:   A BMI below 18.5 is considered underweight.  A BMI of 18.5 to 24.9 is normal.  A BMI of 25 to 29.9 is considered overweight.  A BMI of 30 and above is considered obese.  Watch levels of cholesterol and blood lipids  You should start having your blood tested for lipids and cholesterol at 67 years of age, then have this test every 5 years.  You may need to have your cholesterol levels checked more often if:  Your lipid or cholesterol levels are high.  You are older than 67 years of age.  You are at high risk for heart disease.  CANCER SCREENING   Lung Cancer  Lung cancer screening is recommended for adults 83-66 years old who are at high risk for lung cancer because of a history of smoking.  A yearly low-dose CT scan of the lungs is recommended for people who:  Currently smoke.  Have quit within the past 15 years.  Have at least a 30-pack-year history of smoking. A pack year is smoking an average of one pack of cigarettes a day for 1 year.  Yearly screening should continue until it has been 15 years since you quit.  Yearly screening should stop if you develop a health problem that would prevent you from having lung cancer treatment.  Breast Cancer  Practice breast self-awareness. This means understanding how your breasts normally appear and feel.  It also means doing regular breast self-exams. Let your health care provider know about any changes, no matter how small.  If you are in your 20s or 30s, you should have a clinical breast exam (CBE) by a health care provider every 1-3 years as part of a regular health exam.  If you are 40 or older, have a CBE every year. Also consider having a breast X-ray  (mammogram) every year.  If you have a family history of breast cancer, talk to your health care provider about genetic screening.  If you are at high risk for breast cancer, talk to your health care provider about having an MRI and a mammogram every year.  Breast cancer gene (BRCA) assessment is recommended for women who have family members with BRCA-related cancers. BRCA-related cancers include:  Breast.  Ovarian.  Tubal.  Peritoneal cancers.  Results of the assessment will determine the need for genetic counseling and BRCA1 and BRCA2 testing. Cervical Cancer Routine pelvic examinations to screen for cervical cancer are no longer recommended for nonpregnant women who are considered low risk for cancer of the pelvic organs (ovaries, uterus, and vagina) and who do not have symptoms. A pelvic examination may be necessary if you have symptoms including those associated with pelvic infections. Ask your health care provider if a screening pelvic exam is right for you.   The Pap test is the screening test for cervical cancer for women who are considered at risk.  If you had a hysterectomy for a problem that was not cancer or a condition that could lead to cancer, then you no longer need Pap tests.  If you are older than 65 years, and you have had normal Pap tests for the past 10 years, you no longer need to have Pap tests.  If you have had past treatment for cervical cancer or a condition that could lead to cancer, you need Pap tests and screening for cancer for at least 20 years after your treatment.  If you no longer get a Pap test, assess your risk factors if they change (such as having a new sexual partner). This can affect whether you should start being screened again.  Some women have medical problems that increase their chance of getting cervical cancer. If this is the case for you, your health care provider may recommend more frequent screening and Pap tests.  The human  papillomavirus (HPV) test is another test that may be used for cervical cancer screening. The HPV test looks for the virus that can cause cell changes in the cervix. The cells collected during the Pap test can be tested for HPV.  The HPV test can be used to screen women 30 years of age and older. Getting tested for HPV can extend the interval between normal Pap tests from three to five years.  An HPV test also should be used to screen women of any age who have unclear Pap test results.  After 67 years of age, women should have HPV testing as often as Pap tests.  Colorectal Cancer  This type of cancer can be detected and often prevented.  Routine colorectal cancer screening usually begins at 67 years of age and continues through 67 years of age.  Your health care provider may recommend screening at an earlier age if you have risk factors for colon cancer.  Your health care provider may also recommend using   home test kits to check for hidden blood in the stool.  A small camera at the end of a tube can be used to examine your colon directly (sigmoidoscopy or colonoscopy). This is done to check for the earliest forms of colorectal cancer.  Routine screening usually begins at age 50.  Direct examination of the colon should be repeated every 5-10 years through 67 years of age. However, you may need to be screened more often if early forms of precancerous polyps or small growths are found. Skin Cancer  Check your skin from head to toe regularly.  Tell your health care provider about any new moles or changes in moles, especially if there is a change in a mole's shape or color.  Also tell your health care provider if you have a mole that is larger than the size of a pencil eraser.  Always use sunscreen. Apply sunscreen liberally and repeatedly throughout the day.  Protect yourself by wearing long sleeves, pants, a wide-brimmed hat, and sunglasses whenever you are outside. HEART DISEASE,  DIABETES, AND HIGH BLOOD PRESSURE   Have your blood pressure checked at least every 1-2 years. High blood pressure causes heart disease and increases the risk of stroke.  If you are between 55 years and 79 years old, ask your health care provider if you should take aspirin to prevent strokes.  Have regular diabetes screenings. This involves taking a blood sample to check your fasting blood sugar level.  If you are at a normal weight and have a low risk for diabetes, have this test once every three years after 67 years of age.  If you are overweight and have a high risk for diabetes, consider being tested at a younger age or more often. PREVENTING INFECTION  Hepatitis B  If you have a higher risk for hepatitis B, you should be screened for this virus. You are considered at high risk for hepatitis B if:  You were born in a country where hepatitis B is common. Ask your health care provider which countries are considered high risk.  Your parents were born in a high-risk country, and you have not been immunized against hepatitis B (hepatitis B vaccine).  You have HIV or AIDS.  You use needles to inject street drugs.  You live with someone who has hepatitis B.  You have had sex with someone who has hepatitis B.  You get hemodialysis treatment.  You take certain medicines for conditions, including cancer, organ transplantation, and autoimmune conditions. Hepatitis C  Blood testing is recommended for:  Everyone born from 1945 through 1965.  Anyone with known risk factors for hepatitis C. Sexually transmitted infections (STIs)  You should be screened for sexually transmitted infections (STIs) including gonorrhea and chlamydia if:  You are sexually active and are younger than 67 years of age.  You are older than 67 years of age and your health care provider tells you that you are at risk for this type of infection.  Your sexual activity has changed since you were last screened and  you are at an increased risk for chlamydia or gonorrhea. Ask your health care provider if you are at risk.  If you do not have HIV, but are at risk, it may be recommended that you take a prescription medicine daily to prevent HIV infection. This is called pre-exposure prophylaxis (PrEP). You are considered at risk if:  You are sexually active and do not regularly use condoms or know the HIV status of your   partner(s).  You take drugs by injection.  You are sexually active with a partner who has HIV. Talk with your health care provider about whether you are at high risk of being infected with HIV. If you choose to begin PrEP, you should first be tested for HIV. You should then be tested every 3 months for as long as you are taking PrEP.  PREGNANCY   If you are premenopausal and you may become pregnant, ask your health care provider about preconception counseling.  If you may become pregnant, take 400 to 800 micrograms (mcg) of folic acid every day.  If you want to prevent pregnancy, talk to your health care provider about birth control (contraception). OSTEOPOROSIS AND MENOPAUSE   Osteoporosis is a disease in which the bones lose minerals and strength with aging. This can result in serious bone fractures. Your risk for osteoporosis can be identified using a bone density scan.  If you are 59 years of age or older, or if you are at risk for osteoporosis and fractures, ask your health care provider if you should be screened.  Ask your health care provider whether you should take a calcium or vitamin D supplement to lower your risk for osteoporosis.  Menopause may have certain physical symptoms and risks.  Hormone replacement therapy may reduce some of these symptoms and risks. Talk to your health care provider about whether hormone replacement therapy is right for you.  HOME CARE INSTRUCTIONS   Schedule regular health, dental, and eye exams.  Stay current with your immunizations.   Do  not use any tobacco products including cigarettes, chewing tobacco, or electronic cigarettes.  If you are pregnant, do not drink alcohol.  If you are breastfeeding, limit how much and how often you drink alcohol.  Limit alcohol intake to no more than 1 drink per day for nonpregnant women. One drink equals 12 ounces of beer, 5 ounces of wine, or 1 ounces of hard liquor.  Do not use street drugs.  Do not share needles.  Ask your health care provider for help if you need support or information about quitting drugs.  Tell your health care provider if you often feel depressed.  Tell your health care provider if you have ever been abused or do not feel safe at home. Document Released: 05/06/2011 Document Revised: 03/07/2014 Document Reviewed: 09/22/2013 [facility] Patient Information 2015 [place], [place]. This information is not intended to replace advice given to you by your health care provider. Make sure you discuss any questions you have with your health care provider.

## 2014-11-07 NOTE — Progress Notes (Signed)
Pre visit review using our clinic review tool, if applicable. No additional management support is needed unless otherwise documented below in the visit note. 

## 2014-11-11 ENCOUNTER — Encounter: Payer: Self-pay | Admitting: Family

## 2014-11-11 ENCOUNTER — Ambulatory Visit (INDEPENDENT_AMBULATORY_CARE_PROVIDER_SITE_OTHER): Payer: Medicare Other | Admitting: Family

## 2014-11-11 VITALS — BP 130/60 | HR 65 | Temp 97.9°F | Resp 18 | Ht 69.5 in | Wt 209.6 lb

## 2014-11-11 DIAGNOSIS — H6123 Impacted cerumen, bilateral: Secondary | ICD-10-CM

## 2014-11-11 DIAGNOSIS — H6120 Impacted cerumen, unspecified ear: Secondary | ICD-10-CM | POA: Insufficient documentation

## 2014-11-11 DIAGNOSIS — I25119 Atherosclerotic heart disease of native coronary artery with unspecified angina pectoris: Secondary | ICD-10-CM | POA: Diagnosis not present

## 2014-11-11 NOTE — Assessment & Plan Note (Signed)
Cerumen impaction noted bilateral ears. Both ears were cleaned out with warm water mixed with Docusate Sodium. There was wax return from both ears and ear drum was visible. Patient tolerated this well. Patient given instructions on cleaning solution for preventing future wax buildup. Follow-up if symptoms return.

## 2014-11-11 NOTE — Progress Notes (Signed)
   Subjective:    Patient ID: [name], female    DOB: [birth year], 69 y.o.   MRN: [id number]  Chief Complaint  Patient presents with  . Cerumen Impaction    ear wax in both ears    HPI:  [name] is a 69 y.o. female who presents today for an acute visit.  Was recently seen on Monday and noted that her left ear has been described as itchy. Denies pain, changes in hearing, or ringing in her ears.  Allergies  Allergen Reactions  . Septra [Bactrim] Rash  . Sulfa Antibiotics Rash    Current Outpatient Prescriptions on File Prior to Visit  Medication Sig Dispense Refill  . aspirin 325 MG tablet Take 325 mg by mouth daily.    . Calcium Carbonate-Vitamin D (CALCIUM + D PO) Take 600 mg by mouth 2 (two) times daily.     . Cholecalciferol (VITAMIN D PO) Take 1,000 mg by mouth daily.     . clopidogrel (PLAVIX) 75 MG tablet Take 1 tablet by mouth  daily 90 tablet 1  . ibuprofen (ADVIL,MOTRIN) 200 MG tablet Take 200 mg by mouth every 6 (six) hours as needed.    . Insulin Aspart (INSULIN PUMP) 100 unit/ml SOLN Inject into the skin. Novolog Vial, max 200 units per day with pump    . insulin lispro (HUMALOG) 100 UNIT/ML injection Use max 200 units per day with insulin pump 9 vial 2  . metFORMIN (GLUCOPHAGE-XR) 500 MG 24 hr tablet Take 2 tablets (1,000 mg total) by mouth daily with breakfast. 180 tablet 1  . metoprolol succinate (TOPROL-XL) 100 MG 24 hr tablet Take 1 tablet (100 mg total) by mouth daily. 90 tablet 3  . niacin (NIASPAN) 1000 MG CR tablet Take 1 tablet (1,000 mg total) by mouth at bedtime. 90 tablet 3  . nitroGLYCERIN (NITROSTAT) 0.4 MG SL tablet Place 1 tablet (0.4 mg total) under the tongue every 5 (five) minutes as needed. 25 tablet 11  . omeprazole (PRILOSEC) 20 MG capsule Take 1 capsule (20 mg total) by mouth 2 (two) times daily between meals as needed. 60 capsule 1  . pioglitazone (ACTOS) 15 MG tablet Take 1 tablet (15 mg total) by mouth daily. 90 tablet 1  .  rosuvastatin (CRESTOR) 40 MG tablet Take 1 tablet (40 mg total) by mouth daily. 90 tablet 1   No current facility-administered medications on file prior to visit.      Review of Systems  Constitutional: Negative for fever and chills.  Eyes: Positive for itching. Negative for pain and discharge.      Objective:    BP 130/60 mmHg  Pulse 65  Temp(Src) 97.9 F (36.6 C) (Oral)  Resp 18  Ht 5' 9.5" (1.765 m)  Wt 209 lb 9.6 oz (95.074 kg)  BMI 30.52 kg/m2  SpO2 96% Nursing note and vital signs reviewed.  Physical Exam  Constitutional: She is oriented to person, place, and time. She appears well-developed and well-nourished. No distress.  HENT:  Cerumen impaction noted bilaterally.   Cardiovascular: Normal rate, regular rhythm, normal heart sounds and intact distal pulses.   Pulmonary/Chest: Effort normal and breath sounds normal.  Neurological: She is alert and oriented to person, place, and time.  Skin: Skin is warm and dry.  Psychiatric: She has a normal mood and affect. Her behavior is normal. Judgment and thought content normal.       Assessment & Plan:

## 2014-11-11 NOTE — Patient Instructions (Addendum)
Thank you for choosing [facility].  Summary/Instructions:  If your symptoms worsen or fail to improve, please contact our office for further instruction, or in case of emergency go directly to the emergency room at the closest medical facility.   To prevent wax buildup within the ear:   Use equal parts of water and white vinegar  Soak a cotton ball in the solution and place several drops within the ear  Insert cotton ball in external ear canal and let sit for 30 minutes prior to shower  Remove cotton ball and gently irrigate the ear canal in the shower.  Do not irrigate directly into the ear but rather let it hit the external canal and irrigate.  For maintenance, this can be done 1 time weekly.   Cerumen Impaction A cerumen impaction is when the wax in your ear forms a plug. This plug usually causes reduced hearing. Sometimes it also causes an earache or dizziness. Removing a cerumen impaction can be difficult and painful. The wax sticks to the ear canal. The canal is sensitive and bleeds easily. If you try to remove a heavy wax buildup with a cotton tipped swab, you may push it in further. Irrigation with water, suction, and small ear curettes may be used to clear out the wax. If the impaction is fixed to the skin in the ear canal, ear drops may be needed for a few days to loosen the wax. People who build up a lot of wax frequently can use ear wax removal products available in your local drugstore. SEEK MEDICAL CARE IF:  You develop an earache, increased hearing loss, or marked dizziness. Document Released: 11/28/2004 Document Revised: 01/13/2012 Document Reviewed: 01/18/2010 [facility] Patient Information 2015 [place], [place]. This information is not intended to replace advice given to you by your health care provider. Make sure you discuss any questions you have with your health care provider.

## 2014-11-11 NOTE — Progress Notes (Signed)
Pre visit review using our clinic review tool, if applicable. No additional management support is needed unless otherwise documented below in the visit note. 

## 2014-11-21 ENCOUNTER — Ambulatory Visit (INDEPENDENT_AMBULATORY_CARE_PROVIDER_SITE_OTHER): Payer: Medicare Other | Admitting: Family Medicine

## 2014-11-21 ENCOUNTER — Ambulatory Visit (INDEPENDENT_AMBULATORY_CARE_PROVIDER_SITE_OTHER)
Admission: RE | Admit: 2014-11-21 | Discharge: 2014-11-21 | Disposition: A | Discharge status: Home / Self Care | Payer: Medicare Other | Source: Ambulatory Visit | Attending: Family Medicine | Admitting: Family Medicine

## 2014-11-21 ENCOUNTER — Ambulatory Visit (INDEPENDENT_AMBULATORY_CARE_PROVIDER_SITE_OTHER)
Admission: RE | Admit: 2014-11-21 | Discharge: 2014-11-21 | Disposition: A | Discharge status: Home / Self Care | Payer: Medicare Other | Source: Ambulatory Visit | Attending: Internal Medicine | Admitting: Internal Medicine

## 2014-11-21 ENCOUNTER — Other Ambulatory Visit (INDEPENDENT_AMBULATORY_CARE_PROVIDER_SITE_OTHER): Payer: Medicare Other

## 2014-11-21 ENCOUNTER — Encounter: Payer: Self-pay | Admitting: Family Medicine

## 2014-11-21 VITALS — BP 132/66 | HR 94 | Wt 212.0 lb

## 2014-11-21 DIAGNOSIS — S99912A Unspecified injury of left ankle, initial encounter: Secondary | ICD-10-CM | POA: Diagnosis not present

## 2014-11-21 DIAGNOSIS — M79672 Pain in left foot: Secondary | ICD-10-CM

## 2014-11-21 DIAGNOSIS — I25119 Atherosclerotic heart disease of native coronary artery with unspecified angina pectoris: Secondary | ICD-10-CM | POA: Diagnosis not present

## 2014-11-21 DIAGNOSIS — M84375A Stress fracture, left foot, initial encounter for fracture: Secondary | ICD-10-CM | POA: Insufficient documentation

## 2014-11-21 DIAGNOSIS — M8580 Other specified disorders of bone density and structure, unspecified site: Secondary | ICD-10-CM | POA: Diagnosis not present

## 2014-11-21 DIAGNOSIS — S9032XA Contusion of left foot, initial encounter: Secondary | ICD-10-CM | POA: Diagnosis not present

## 2014-11-21 DIAGNOSIS — M25571 Pain in right ankle and joints of right foot: Secondary | ICD-10-CM | POA: Diagnosis not present

## 2014-11-21 NOTE — Patient Instructions (Signed)
Good to see you Ice bath at night still  Continue vitamin D but bump it up to 4000 IU daily Get xrays downstairs.  Elevate when you can  See me again in 2-3 weeks.

## 2014-11-21 NOTE — Progress Notes (Signed)
  [name] [address] [place], [place] [address] Phone: [phone] Subjective:     CC: Left foot pain  [name] is a 69 y.o. female coming in with complaint of left heel pain. Has dx of PF.      patient states though that recently she has had increasing swelling of the foot. Patient states that this is been very painful. Patient does not remember any true injury. States it's been this way for approximately 10-14 days. States that the pain seems to not be improving. Worse on the foot to the ankle. Mild bruising she states noted. Once again does not remember any true injury.  Past medical history, social, surgical and family history all reviewed in electronic medical record.   Review of Systems: No headache, visual changes, nausea, vomiting, diarrhea, constipation, dizziness, abdominal pain, skin rash, fevers, chills, night sweats, weight loss, swollen lymph nodes, body aches, joint swelling, muscle aches, chest pain, shortness of breath, mood changes.   Objective Blood pressure 132/66, pulse 94, weight 212 lb (96.163 kg), SpO2 95 %.  General: No apparent distress alert and oriented x3 mood and affect normal, dressed appropriately.  HEENT: Pupils equal, extraocular movements intact  Respiratory: Patient's speak in full sentences and does not appear short of breath  Cardiovascular: No lower extremity edema, non tender, no erythema  Skin: Warm dry intact with no signs of infection or rash on extremities or on axial skeleton.  Abdomen: Soft nontender  Neuro: Cranial nerves II through XII are intact, neurovascularly intact in all extremities with 2+ DTRs and 2+ pulses.  Lymph: No lymphadenopathy of posterior or anterior cervical chain or axillae bilaterally.  Gait normal with good balance and coordination.  MSK:  Non tender with full range of motion and good stability and symmetric strength and tone of shoulders, elbows, wrist, hip, knee  and ankles bilaterally.  Normal inspection with no visable or palpable fat pad atrophy and no visible swelling/erythema.  Left Foot exam shows the patient does have significant dorsal swelling. Patient does have some mild bruising over the lateral aspect of the foot. Patient is severely tender over the fourth and fifth as well as third metatarsals. No pain over the medial or lateral malleolus. Neurovascularly intact distally. Non-tender with compression of the gastrocnemius.   MSK US performed of: Left foot This study was ordered, performed, and interpreted by Charlann Boxer D.O.  Foot/Ankle:   All structures visualized.   Talar dome unremarkable  Ankle mortise without effusion. Peroneus longus and brevis tendons unremarkable on long and transverse views without sheath effusions. Posterior tibialis, flexor hallucis longus, and flexor digitorum longus tendons unremarkable on long and transverse views without sheath effusions. Achilles tendon visualized along length of tendon and unremarkable on long and transverse views without sheath effusion. Anterior Talofibular Ligament and Calcaneofibular Ligaments unremarkable and intact. Deltoid Ligament unremarkable and intact. Patient does have calcific changes over the third and fourth metatarsals and significant hypoechoic changes and soft tissue swelling over the superior lateral aspect of the foot.   IMPRESSION:   metatarsal fracture     Impression and Recommendations:     This case required medical decision making of moderate complexity.

## 2014-11-21 NOTE — Assessment & Plan Note (Signed)
Patient has what appears to be more of a stress reaction of the split. Patient does have significant swelling. Patient was put in a Cam Walker today. We discussed icing protocol. Patient will take the vitamin D supplementation. Patient come back in 2-4 weeks for further evaluation. X-rays were ordered today for further evaluation. I think this could be early enough that this will not show up on x-ray initially.

## 2014-12-05 ENCOUNTER — Ambulatory Visit: Payer: Medicare Other | Admitting: Family Medicine

## 2014-12-06 ENCOUNTER — Encounter: Payer: Self-pay | Admitting: Family Medicine

## 2014-12-06 ENCOUNTER — Other Ambulatory Visit (INDEPENDENT_AMBULATORY_CARE_PROVIDER_SITE_OTHER): Payer: Medicare Other

## 2014-12-06 ENCOUNTER — Ambulatory Visit (INDEPENDENT_AMBULATORY_CARE_PROVIDER_SITE_OTHER): Payer: Medicare Other | Admitting: Family Medicine

## 2014-12-06 VITALS — BP 118/62 | HR 80 | Ht 69.5 in | Wt 212.0 lb

## 2014-12-06 DIAGNOSIS — M79672 Pain in left foot: Secondary | ICD-10-CM | POA: Diagnosis not present

## 2014-12-06 DIAGNOSIS — I25119 Atherosclerotic heart disease of native coronary artery with unspecified angina pectoris: Secondary | ICD-10-CM | POA: Diagnosis not present

## 2014-12-06 DIAGNOSIS — M84375D Stress fracture, left foot, subsequent encounter for fracture with routine healing: Secondary | ICD-10-CM | POA: Diagnosis not present

## 2014-12-06 NOTE — Progress Notes (Signed)
Pre visit review using our clinic review tool, if applicable. No additional management support is needed unless otherwise documented below in the visit note. 

## 2014-12-06 NOTE — Assessment & Plan Note (Signed)
Condition is showing some healing overall. I do think she is going to do relatively well. We discussed the possibility of a compression sleeve as well as elevation. Patient will try to do the ice on a more frequent basis. Patient will continue with the vitamin D supplementation. Patient will continue to wear the Cam Walker for the next 2 weeks. Patient and will transition to the shoe for one week and then come back and see me in 3 weeks. At that time we will do another ultrasound to make sure she is healing. Patient having the significant amount of foot pathology recently and do feel that with the loss of the transverse arch patient will need custom orthotics at some point. We will discuss again at follow-up.

## 2014-12-06 NOTE — Progress Notes (Signed)
  [name] [address], [place] [address] Phone: [phone] Subjective:    CC: Left foot pain  [id number] [name] is a 69 y.o. female coming in with complaint of left heel pain. Has dx of PF.      Patient was previously seen and had metatarsal fracture.  Patient has been wearing the Cam Walker. Patient states that she has been doing somewhat better. Still has some mild swelling. States that she does have pain if she started on the [employer] but she is able to do all other activities of daily living signs she wears the [employer]. Denies any new symptoms.   Past medical history, social, surgical and family history all reviewed in electronic medical record.   Review of Systems: No headache, visual changes, nausea, vomiting, diarrhea, constipation, dizziness, abdominal pain, skin rash, fevers, chills, night sweats, weight loss, swollen lymph nodes, body aches, joint swelling, muscle aches, chest pain, shortness of breath, mood changes.   Objective Blood pressure 118/62, pulse 80, height 5' 9.5" (1.765 m), weight 212 lb (96.163 kg), SpO2 94 %.  General: No apparent distress alert and oriented x3 mood and affect normal, dressed appropriately.  HEENT: Pupils equal, extraocular movements intact  Respiratory: Patient's speak in full sentences and does not appear short of breath  Cardiovascular: No lower extremity edema, non tender, no erythema  Skin: Warm dry intact with no signs of infection or rash on extremities or on axial skeleton.  Abdomen: Soft nontender  Neuro: Cranial nerves II through XII are intact, neurovascularly intact in all extremities with 2+ DTRs and 2+ pulses.  Lymph: No lymphadenopathy of posterior or anterior cervical chain or axillae bilaterally.  Gait normal with good balance and coordination.  MSK:  Non tender with full range of motion and good stability and symmetric strength and tone of shoulders, elbows, wrist, hip,  knee and ankles bilaterally.  Normal inspection with no visable or palpable fat pad atrophy and no visible swelling/erythema.  Left Foot exam shows the patient does have decreased swelling. No bruising noted. Patient is still minimally tender over the fourth metatarsal. Neurovascular intact distally.   MSK US performed of: Left foot This study was ordered, performed, and interpreted by Charlann Boxer D.O.  Foot/Ankle:   All structures visualized.   Talar dome unremarkable  Ankle mortise without effusion. Peroneus longus and brevis tendons unremarkable on long and transverse views without sheath effusions. Posterior tibialis, flexor hallucis longus, and flexor digitorum longus tendons unremarkable on long and transverse views without sheath effusions. Achilles tendon visualized along length of tendon and unremarkable on long and transverse views without sheath effusion. Anterior Talofibular Ligament and Calcaneofibular Ligaments unremarkable and intact. Deltoid Ligament unremarkable and intact. Patient does have calcific changes over the third and fourth metatarsals patient has significant decrease swelling from previous exam. Patient does have good callus formation over the area that where the fracture was seen previously.  IMPRESSION:   metatarsal fracture with interval healing    Impression and Recommendations:     This case required medical decision making of moderate complexity.

## 2014-12-06 NOTE — Patient Instructions (Signed)
Good to see you You are doing well continue the vitamin D  Ice still Continue the boot out of the house for next 2 weeks Then can try to do shoe for a week and then come back to see [place] in 3 weeks Good luck with your mom, consider a hybrid car ;) You will need orthotics at some point.  See me again in 3 weeks.

## 2014-12-21 DIAGNOSIS — B078 Other viral warts: Secondary | ICD-10-CM | POA: Diagnosis not present

## 2014-12-21 DIAGNOSIS — L718 Other rosacea: Secondary | ICD-10-CM | POA: Diagnosis not present

## 2014-12-26 ENCOUNTER — Encounter: Payer: Self-pay | Admitting: Family Medicine

## 2014-12-26 ENCOUNTER — Ambulatory Visit (INDEPENDENT_AMBULATORY_CARE_PROVIDER_SITE_OTHER): Payer: Medicare Other | Admitting: Family Medicine

## 2014-12-26 VITALS — BP 128/70 | HR 80 | Wt 210.0 lb

## 2014-12-26 DIAGNOSIS — I25119 Atherosclerotic heart disease of native coronary artery with unspecified angina pectoris: Secondary | ICD-10-CM | POA: Diagnosis not present

## 2014-12-26 DIAGNOSIS — M84375D Stress fracture, left foot, subsequent encounter for fracture with routine healing: Secondary | ICD-10-CM

## 2014-12-26 NOTE — Progress Notes (Signed)
Pre visit review using our clinic review tool, if applicable. No additional management support is needed unless otherwise documented below in the visit note. 

## 2014-12-26 NOTE — Patient Instructions (Addendum)
Good to see you You can wear the orthotics a 2 hours the first day and then increase 1 hour a day until full day.  Back down on vitamin D to 2000 IU daily Still ice at night You are getting close.  Next time shopping wider shoes.  See me again in 3 weeks to make sure completely healed.

## 2014-12-26 NOTE — Progress Notes (Signed)
[name] [address] [place], [place] [address] Phone: [phone] Subjective:    CC: Left foot pain  [id number] [name] is a 69 y.o. female coming in with complaint of left heel pain. Has dx of PF.      Patient was previously seen and had metatarsal fracture.  Patient was transferring into a shoe after last visit. Patient did have some callus formation compared to her previous exam. Patient had x-rays not showing any significant changes. Patient states she is doing significantly better. Still mild discomfort. Patient was wearing the shoe most of the time. Patient has been taking higher dose vitamin D supplementation.   Past medical history, social, surgical and family history all reviewed in electronic medical record.   Review of Systems: No headache, visual changes, nausea, vomiting, diarrhea, constipation, dizziness, abdominal pain, skin rash, fevers, chills, night sweats, weight loss, swollen lymph nodes, body aches, joint swelling, muscle aches, chest pain, shortness of breath, mood changes.   Objective Blood pressure 128/70, pulse 80, weight 210 lb (95.255 kg), SpO2 95 %.  General: No apparent distress alert and oriented x3 mood and affect normal, dressed appropriately.  HEENT: Pupils equal, extraocular movements intact  Respiratory: Patient's speak in full sentences and does not appear short of breath  Cardiovascular: No lower extremity edema, non tender, no erythema  Skin: Warm dry intact with no signs of infection or rash on extremities or on axial skeleton.  Abdomen: Soft nontender  Neuro: Cranial nerves II through XII are intact, neurovascularly intact in all extremities with 2+ DTRs and 2+ pulses.  Lymph: No lymphadenopathy of posterior or anterior cervical chain or axillae bilaterally.  Gait normal with good balance and coordination.  MSK:  Non tender with full range of motion and good stability and symmetric strength and tone of  shoulders, elbows, wrist, hip, knee and ankles bilaterally.  Normal inspection with no visable or palpable fat pad atrophy and no visible swelling/erythema.  Left Foot exam shows the patient does have decreased swelling. No bruising noted. Patient is still minimally tender over the fourth metatarsal. Neurovascular intact distally.   MSK US performed of: Left foot This study was ordered, performed, and interpreted by Charlann Boxer D.O.  Foot/Ankle:   All structures visualized.   Talar dome unremarkable  Ankle mortise without effusion. Peroneus longus and brevis tendons unremarkable on long and transverse views without sheath effusions. Posterior tibialis, flexor hallucis longus, and flexor digitorum longus tendons unremarkable on long and transverse views without sheath effusions. Achilles tendon visualized along length of tendon and unremarkable on long and transverse views without sheath effusion. Anterior Talofibular Ligament and Calcaneofibular Ligaments unremarkable and intact. Deltoid Ligament unremarkable and intact. Patient does have calcific changes over the third and fourth metatarsals patient has significant decrease swelling from previous exam. Significant changes showing the patient has improved from the metatarsal fracture.  IMPRESSION:   Near completely healed metatarsal fracture.  Procedure Note   Patient was fitted for a : standard, cushioned, semi-rigid orthotic. The orthotic was heated and afterward the patient patient seated position and molded The patient was positioned in subtalar neutral position and 10 degrees of ankle dorsiflexion in a weight bearing stance. After completion of molding, patient did have orthotic management The blank was ground to a stable position for weight bearing. Size:11 Base: Carbon fiber Additional Posting and Padding: Medial longitudinal arch The patient ambulated these, and they were very comfortable. I spent 45 minutes with this patient,  greater than 50% was face-to-face time counseling regarding the below diagnosis.     Impression and Recommendations:

## 2014-12-26 NOTE — Assessment & Plan Note (Signed)
Patient is doing significantly better at this time. Patient was putting custom orthotics today that I think be very beneficial. We discussed continuing the over-the-counter medications as well as the icing per call. Patient will slowly increase the amount of wear she will do of the orthotics over the course of time here. Patient will come back and see me again in 3 weeks to make sure the patient is improving.

## 2015-01-09 ENCOUNTER — Encounter: Payer: Self-pay | Admitting: Family Medicine

## 2015-01-11 ENCOUNTER — Other Ambulatory Visit (INDEPENDENT_AMBULATORY_CARE_PROVIDER_SITE_OTHER): Payer: Medicare Other

## 2015-01-11 DIAGNOSIS — E1065 Type 1 diabetes mellitus with hyperglycemia: Secondary | ICD-10-CM | POA: Diagnosis not present

## 2015-01-11 DIAGNOSIS — E782 Mixed hyperlipidemia: Secondary | ICD-10-CM | POA: Diagnosis not present

## 2015-01-11 DIAGNOSIS — IMO0002 Reserved for concepts with insufficient information to code with codable children: Secondary | ICD-10-CM

## 2015-01-11 LAB — BASIC METABOLIC PANEL
BUN: 13 mg/dL (ref 6–23)
CALCIUM: 9.4 mg/dL (ref 8.4–10.5)
CO2: 30 mEq/L (ref 19–32)
Chloride: 104 mEq/L (ref 96–112)
Creatinine, Ser: 0.76 mg/dL (ref 0.40–1.20)
GFR: 80.66 mL/min (ref >60.00)
GLUCOSE: 171 mg/dL — AB (ref 70–99)
Potassium: 4.3 mEq/L (ref 3.5–5.1)
Sodium: 137 mEq/L (ref 135–145)

## 2015-01-11 LAB — LDL CHOLESTEROL, DIRECT: LDL DIRECT: 79 mg/dL

## 2015-01-11 LAB — HEMOGLOBIN A1C: Hgb A1c MFr Bld: 7.4 % — ABNORMAL HIGH (ref 4.6–6.5)

## 2015-01-12 LAB — LIPOPROTEIN ANALYSIS BY NMR
HDL Particle Number: 37.3 umol/L (ref >=30.5)
LDL Particle Number: 956 nmol/L (ref <1000)
LDL SIZE: 21.5 nm (ref >20.5)
LP-IR Score: <25 (ref <=45)
Small LDL Particle Number: 283 nmol/L (ref <=527)

## 2015-01-16 ENCOUNTER — Encounter: Payer: Self-pay | Admitting: Endocrinology

## 2015-01-16 ENCOUNTER — Ambulatory Visit (INDEPENDENT_AMBULATORY_CARE_PROVIDER_SITE_OTHER): Payer: Medicare Other | Admitting: Endocrinology

## 2015-01-16 ENCOUNTER — Ambulatory Visit: Payer: Medicare Other | Admitting: Endocrinology

## 2015-01-16 VITALS — BP 138/82 | HR 73 | Temp 97.8°F | Resp 14 | Ht 69.5 in | Wt 210.0 lb

## 2015-01-16 DIAGNOSIS — E1065 Type 1 diabetes mellitus with hyperglycemia: Secondary | ICD-10-CM | POA: Diagnosis not present

## 2015-01-16 DIAGNOSIS — E782 Mixed hyperlipidemia: Secondary | ICD-10-CM

## 2015-01-16 DIAGNOSIS — I25119 Atherosclerotic heart disease of native coronary artery with unspecified angina pectoris: Secondary | ICD-10-CM | POA: Diagnosis not present

## 2015-01-16 DIAGNOSIS — IMO0002 Reserved for concepts with insufficient information to code with codable children: Secondary | ICD-10-CM

## 2015-01-16 NOTE — Patient Instructions (Signed)
Reduce basals as directed  PLEASE CALL IF GETTING  FREQUENT LOWS

## 2015-01-16 NOTE — Progress Notes (Signed)
Patient ID: [name], female   DOB: [birth year], 69 y.o.   MRN: [id number]   Reason for visit: DIABETES followup.   HISTORY of present illness:  Diagnosis: Type 1 diabetes mellitus, date of diagnosis: 1979.   Insulin Pump: CURRENT brand: One Touch Ping The pump settings are Basal rate: Midnight = 1.5. 2 AM = 1.0.  5 AM = 1.8, 8 AM = 1.85, 12 noon = 1.2 and 5 PM = 2.7, 7 PM = 2.9  Carbohydrate ratio 1:9 lunch and 1:5 at dinner; sensitivity 1: 35, 30 after 5 p.m.; target 120 +/-10  Insulin on board indicator on, duration 4 hrs   DIABETES: She has had long-standing diabetes with A1c levels usually above target. She had relatively high readings in 2013.  In 10/13 ACTOS was restarted because of her large insulin requirement and diabetic dyslipidemia.  Her blood sugars improved significantly and basal rates were reduced. Her best A1c was 6.9.   RECENT history:  Her A1c has increased somewhat to 7.4 However clear why this is higher since she is recently getting more hypoglycemia than before  Current blood sugar patterns and problems identified:  She is tending to get frequent and significantly low glucose readings overnight and before supper time recently.  Not clear why she is starting get these low sugars as she has been relatively inactive and has not changed her diet in the evenings or at bedtime  She is currently not having significant hyperglycemia except sporadically in the mornings and either at suppertime or bedtime.  Some of the morning high sugars are from rebound related to hypoglycemia  She has not checked her sugars by fingerstick as much in the afternoons and evenings recently  and sometimes may have rebound hyperglycemia after getting low sugars  Postprandial readings are fairly good and possibly on the lower side at least at breakfast and lunch without hypoglycemia  She thinks that she is using a dual wave for foods like pasta even though it and to raise her blood sugar  more quickly  She is not doing any formal exercise because of foot problems but is planning to start now COMPLIANCE with boluses has been fairly good although because of tendency to low sugars she may sometimes skip the bolus, not many boluses entered at lunch lately  Monitors blood glucose: on an average 4.5 times a day according to pump download.  Blood Glucose readings on her monitor:   Overall average 138 Highest average blood sugar from 4 to 6 PM with average 192 and lowest blood sugar 62 overnight Recent fasting range 76-265 Lowest reading was 32 at 2 AM  CGM RECORD INTERPRETATION    Dates of Recording: 01/10/15 through 01/16/15          Sensor  summary:   Overall average glucose is recently 115+/-55  Has 32 % within target, 36 % high and 32 % low range.  Normal range 80-130 The lowest average at any given our is 73 at 1 AM and the highest of 161 at 10 AM      Glycemic patterns:  Glucose excursion profile shows most significant patterns of lows between midnight and 3 AM and daytime lows between 6 PM-8 PM.  She has no patterns of hyperglycemia      Overnight periods:   Her blood sugars appear to be getting lower significantly soon after midnight with the lowest readings about 1-3 AM and at least on some occasions rising back to normal or slightly above  normal.  However on 3/11 her blood sugar appeared to be low throughout the night until 7 AM at levels below 50 and also had another prolonged low sugar after 4 AM on 3/8.  Blood sugars were normal to slightly high on the last 2 nights which were 3/14 and 3/13      Preprandial periods:   blood sugars are relatively good are slightly high at breakfast time; mostly near normal at lunchtime and tending to be readily low before supper.  Average blood sugar at suppertime between 6-7 PM ranges from 72-96      Postprandial periods:   blood sugars are flat to somewhat higher after breakfast but not clear this is related to rebound from low sugars  and does not have continuous tracing on all days.  Blood sugars are flat lower after lunch and variable after supper with some low and some high readings on different days  l      Hypoglycemia:  occurring most significantly overnight as discussed above and frequently between 6 PM-8 PM on at least 3 occasions and also on 2 occasions between 11 PM-midnight       Hypoglycemic awareness: Has symptoms of feeling tired, headache, sweaty. Variable recognition threshold present  Sometimes may not be aware during the night and will be notified by her sensor that the sugar is low  She has symptoms when blood glucose is less than 60. Uses glucose tablets for treatment when not at home but otherwise eats a snack .   Meals: 3 meals per day.  DIET has been relatively inconsistent overall and her mealtimes are variable. Frequently will not eat breakfast.  Usually trying to have low fat meals  Food preferences: Yogurt And cottage cheese for breakfast usually.  Physical activity: exercise: none, was upto 45 min occ in ams. Historically after exercise blood sugar will get lower in 1-2 hrs. .  Certified Diabetes Educator visit: Most recent: 8/12.   She is usually regular with her eye exams  Wt Readings from Last 3 Encounters:  01/16/15 210 lb (95.255 kg)  12/26/14 210 lb (95.255 kg)  12/06/14 212 lb (96.163 kg)    LABS:  Lab Results  Component Value Date   HGBA1C 7.4* 01/11/2015   HGBA1C 7.0* 10/13/2014   HGBA1C 7.2* 07/12/2014   Lab Results  Component Value Date   MICROALBUR 0.2 04/19/2014   LDLCALC 53 10/13/2014   CREATININE 0.76 01/11/2015    Lab on 01/11/2015  Component Date Value Ref Range Status  . Hgb A1c MFr Bld 01/11/2015 7.4* 4.6 - 6.5 % Final   Glycemic Control Guidelines for People with Diabetes:Non Diabetic:  <6%Goal of Therapy: <7%Additional Action Suggested:  >8%   . Sodium 01/11/2015 137  135 - 145 mEq/L Final  . Potassium 01/11/2015 4.3  3.5 - 5.1 mEq/L Final  . Chloride  01/11/2015 104  96 - 112 mEq/L Final  . CO2 01/11/2015 30  19 - 32 mEq/L Final  . Glucose, Bld 01/11/2015 171* 70 - 99 mg/dL Final  . BUN 01/11/2015 13  6 - 23 mg/dL Final  . Creatinine, Ser 01/11/2015 0.76  0.40 - 1.20 mg/dL Final  . Calcium 01/11/2015 9.4  8.4 - 10.5 mg/dL Final  . GFR 01/11/2015 80.66  >60.00 mL/min Final  . LDL Particle Number 01/11/2015 [id number]  <1000 nmol/L Final   Comment:  Low                   < 1000                           Moderate         1000 - 1299                           Borderline-High  1300 - 1599                           High             1600 - 2000                           Very High             > 2000   . HDL Particle Number 01/11/2015 [id number]  >=30.5 umol/L Final  . Small LDL Particle Number 01/11/2015 [id number]  <=527 nmol/L Final  . LDL Size 01/11/2015 21.5  >20.5 nm Final   Comment:  ----------------------------------------------------------                  ** INTERPRETATIVE INFORMATION**                  PARTICLE CONCENTRATION AND SIZE                     <--Lower CVD Risk   Higher CVD Risk-->   LDL AND HDL PARTICLES   Percentile in Reference Population   HDL-P (total)        High     75th    50th    25th   Low                        >34.9    34.9    30.5    26.7   <26.7   Small LDL-P          Low      25th    50th    75th   High                        <117     117     527     839    >839   LDL Size   <-Large (Pattern A)->    <-Small (Pattern B)->                     23.0    20.6           20.5      19.0  ---------------------------------------------------------- Small LDL-P and LDL Size are associated with CVD risk, but not after LDL-P is taken into account. These assays were developed and their performance characteristics determined by [employer]. These assays have not been cleared by the [place] Food and Drug Administration. The clinical utility o                          f these laboratory values have not been fully  established.   Marland Kitchen LP-IR Score 01/11/2015 <25  <=45 Final   Comment: INSULIN RESISTANCE MARKER     <--Insulin Sensitive  Insulin Resistant-->            Percentile in Reference Population Insulin Resistance Score LP-IR Score   Low   25th   50th   75th   High               <27   27     45     63     >63 LP-IR Score is inaccurate if patient is non-fasting. The LP-IR score is a laboratory developed index that has been associated with insulin resistance and diabetes risk and should be used as one component of a physician's clinical assessment. The LP-IR score listed above has not been cleared by the [place] Food and Drug Administration.   . Direct LDL 01/11/2015 79.0   Final   Optimal:  <100 mg/dLNear or Above Optimal:  100-129 mg/dLBorderline High:  130-159 mg/dLHigh:  160-189 mg/dLVery High:  >190 mg/dL      Medication List       This list is accurate as of: 01/16/15  1:12 PM.  Always use your most recent med list.               aspirin 325 MG tablet  Take 325 mg by mouth daily.     CALCIUM + D PO  Take 600 mg by mouth 2 (two) times daily.     clopidogrel 75 MG tablet  Commonly known as:  PLAVIX  Take 1 tablet by mouth  daily     doxycycline 100 MG capsule  Commonly known as:  VIBRAMYCIN     ibuprofen 200 MG tablet  Commonly known as:  ADVIL,MOTRIN  Take 200 mg by mouth every 6 (six) hours as needed.     insulin lispro 100 UNIT/ML injection  Commonly known as:  HUMALOG  Use max 200 units per day with insulin pump     insulin pump Soln  Inject into the skin. Novolog Vial, max 200 units per day with pump     metFORMIN 500 MG 24 hr tablet  Commonly known as:  GLUCOPHAGE-XR  Take 2 tablets (1,000 mg total) by mouth daily with breakfast.     metoprolol succinate 100 MG 24 hr tablet  Commonly known as:  TOPROL-XL  Take 1 tablet (100 mg total) by mouth daily.     metroNIDAZOLE 0.75 % gel  Commonly known as:  METROGEL     niacin 1000 MG CR tablet  Commonly known as:   NIASPAN  Take 1 tablet (1,000 mg total) by mouth at bedtime.     nitroGLYCERIN 0.4 MG SL tablet  Commonly known as:  NITROSTAT  Place 1 tablet (0.4 mg total) under the tongue every 5 (five) minutes as needed.     omeprazole 20 MG capsule  Commonly known as:  PRILOSEC  Take 1 capsule (20 mg total) by mouth 2 (two) times daily between meals as needed.     pioglitazone 15 MG tablet  Commonly known as:  ACTOS  Take 1 tablet (15 mg total) by mouth daily.     rosuvastatin 40 MG tablet  Commonly known as:  CRESTOR  Take 1 tablet (40 mg total) by mouth daily.     tretinoin 0.1 % cream  Commonly known as:  RETIN-A     VITAMIN D PO  Take 1,000 mg by mouth 2 (two) times daily.        Allergies:  Allergies  Allergen Reactions  . Septra [Bactrim] Rash  . Sulfa Antibiotics Rash  Past Medical History  Diagnosis Date  . Coronary artery disease     2009 LAD Promus stent  . Hyperlipidemia   . Osteopenia   . Arthritis   . Type 1 diabetes mellitus on insulin therapy   . Fibroid   . Atrial flutter     a. s/p TEE/DCCV 08/16/13 (normal LV function, no LAA thrombus).b. s/p ablation by Dr Lovena Le 09-23-2013  . Valvular heart disease     a. Mild MR/TR by TEE 08/2013.    Past Surgical History  Procedure Laterality Date  . Coronary angioplasty with stent placement    . Pelvic laparoscopy    . Myomectomy  1977  . Appendectomy    . Cataract extraction Bilateral   . Tee without cardioversion N/A 08/16/2013    Procedure: TRANSESOPHAGEAL ECHOCARDIOGRAM (TEE);  Surgeon: Thayer Headings, MD;  Location: [facility];  Service: Cardiovascular;  Laterality: N/A;  . Cardioversion N/A 08/16/2013    Procedure: CARDIOVERSION;  Surgeon: Thayer Headings, MD;  Location: [facility];  Service: Cardiovascular;  Laterality: N/A;  spoke with [name]  . Ablation  09-23-2013    CTI by Dr Lovena Le  . Colonoscopy    . Atrial flutter ablation N/A 09/23/2013    Procedure: ATRIAL FLUTTER ABLATION;  Surgeon:  Evans Lance, MD;  Location: [facility] CATH LAB;  Service: Cardiovascular;  Laterality: N/A;    Family History  Problem Relation Age of Onset  . Atrial fibrillation Mother   . Diabetes Mother   . Hypertension Mother   . CAD Father 22    died age 28  . CAD Brother 26    small vessel disease  . CAD Cousin     paternal  . CAD Paternal Grandfather     early onset  . Colon cancer Neg Hx     Social History:  reports that she has never smoked. She has never used smokeless tobacco. She reports that she drinks about 1.2 oz of alcohol per week. She reports that she does not use illicit drugs.  REVIEW of systems:  She has a history of coronary disease but no recent chest pain  She has had diabetic dyslipidemia with significantly high LDL particle number and this has been managed with Crestor and Niaspan   In 6/15 her particle number was still slightly above 1000 and particle size normal    Lab Results  Component Value Date   CHOL 147 10/13/2014   HDL 55.50 10/13/2014   LDLCALC 53 10/13/2014   LDLDIRECT 79.0 01/11/2015   TRIG 192.0* 10/13/2014   CHOLHDL 3 10/13/2014   No significant hypertension and has had atrial arrhythmias treated with metoprolol She had atrial fibrillation ablation done  Has history of arthritis  BP 138/82 mmHg  Pulse 73  Temp(Src) 97.8 F (36.6 C)  Resp 14  Ht 5' 9.5" (1.765 m)  Wt 210 lb (95.255 kg)  BMI 30.58 kg/m2  SpO2 98%   ASSESSMENT/PLAN:  DIABETES type 1 with fair control, A1c is reasonably good at 7.0 given the difficulty of her diabetes control  See history of present illness for detailed discussion of her current blood sugar patterns and management with her pump and interpretation of her continuous glucose sensor Her main problems appear to be tending to get frequent and significantly low glucose overnight and before supper time recently Not clear why she is starting get these low sugars as she has been relatively inactive Discussed with  the patient that she should inform us if she is  having difficulties with persistent and frequent hypoglycemia rather than wait 3 months  She is currently not having significant hyperglycemia except sporadically and sometimes may have rebound hyperglycemia after getting low sugars  No clear patterns of postprandial hyperglycemia either with only sporadic high readings after lunch and dinner She will need to reduce her basal rates through the night and also in the evenings starting at 5 PM Will not change her bolus settings as yet Given her a copy of her basal settings to change Midnight = 1.3, 2 AM = 0.9, 5 PM = 2.4, 7 PM = 2.7 and 10 PM = 2.5  Also with her planning to start exercise she will need to use temporary basal of about 50% after her exercise for a couple of hours since she tends to have somewhat delayed hypoglycemia with activities She was also advised to check on the G5 DexCom which should be able to display on her smart phone  Hyperlipidemia:  LDL is controlled and her particle number is significantly better at below 1000 Small particle number is also within normal range now She will continue her medications unchanged including niacin in addition to Crestor and Actos for benefits on particle size and HDL  Counseling time over 50% of today's 25 minute visit  Patient Instructions  Reduce basals as directed  PLEASE CALL IF [place]

## 2015-01-18 ENCOUNTER — Encounter: Payer: Self-pay | Admitting: Family Medicine

## 2015-01-18 ENCOUNTER — Ambulatory Visit (INDEPENDENT_AMBULATORY_CARE_PROVIDER_SITE_OTHER): Payer: Medicare Other | Admitting: Family Medicine

## 2015-01-18 VITALS — BP 130/68 | HR 70 | Ht 69.5 in | Wt 210.0 lb

## 2015-01-18 DIAGNOSIS — M25572 Pain in left ankle and joints of left foot: Secondary | ICD-10-CM | POA: Diagnosis not present

## 2015-01-18 DIAGNOSIS — I25119 Atherosclerotic heart disease of native coronary artery with unspecified angina pectoris: Secondary | ICD-10-CM

## 2015-01-18 DIAGNOSIS — M79672 Pain in left foot: Secondary | ICD-10-CM | POA: Diagnosis not present

## 2015-01-18 NOTE — Progress Notes (Signed)
Pre visit review using our clinic review tool, if applicable. No additional management support is needed unless otherwise documented below in the visit note. 

## 2015-01-18 NOTE — Patient Instructions (Addendum)
So good to see you as always We will get an MRI of foot ankle Consider berkenstock sandals to see if they would feel good See me 1-2 days after MRI and we will discuss

## 2015-01-18 NOTE — Assessment & Plan Note (Signed)
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 X-rays do not show that his been an MRI is necessary. We will get this MRI. Patient will come back 1-2 days afterwards and we'll discuss further treatment options at that time. Due to the length of this pain patient may need to have surgical intervention if needed.  Spent  25 minutes with patient face-to-face and had greater than 50% of counseling including as described above in assessment and plan.

## 2015-01-18 NOTE — Progress Notes (Signed)
  [name] [place], [place] [address] Phone: [phone] Subjective:    CC: Left foot pain  [id number] [name] is a 69 y.o. female coming in with complaint of left foot pain.  Patient was found to have more of a metatarsal fracture as well as diffuse degenerative changes of the foot. Patient has been put in custom orthotics has slowly started increasing her activity. Patient states unfortunately she continues to have pain. Patient states that the pain seems to be localized on the medial aspect of her ankle and foot. Patient states that it seems to be getting almost worse. Patient has changed her plans on traveling this summer because she is scared of walking long distances.       Past medical history, social, surgical and family history all reviewed in electronic medical record.   Review of Systems: No headache, visual changes, nausea, vomiting, diarrhea, constipation, dizziness, abdominal pain, skin rash, fevers, chills, night sweats, weight loss, swollen lymph nodes, body aches, joint swelling, muscle aches, chest pain, shortness of breath, mood changes.   Objective Blood pressure 130/68, pulse 70, height 5' 9.5" (1.765 m), weight 210 lb (95.255 kg), SpO2 96 %.  General: No apparent distress alert and oriented x3 mood and affect normal, dressed appropriately.  HEENT: Pupils equal, extraocular movements intact  Respiratory: Patient's speak in full sentences and does not appear short of breath  Cardiovascular: No lower extremity edema, non tender, no erythema  Skin: Warm dry intact with no signs of infection or rash on extremities or on axial skeleton.  Abdomen: Soft nontender  Neuro: Cranial nerves II through XII are intact, neurovascularly intact in all extremities with 2+ DTRs and 2+ pulses.  Lymph: No lymphadenopathy of posterior or anterior cervical chain or axillae bilaterally.  Gait normal with good balance and coordination.    MSK:  Non tender with full range of motion and good stability and symmetric strength and tone of shoulders, elbows, wrist, hip, knee and ankles bilaterally.  Normal inspection with no visable or palpable fat pad atrophy and no visible swelling/erythema.  Left Foot exam shows the patient does have decreased swelling. No bruising noted.  Patient though is still very tender over the first metatarsal as well as tender over the navicular bone. This is a new finding.       Impression and Recommendations:     This case required medical decision making of moderate complexity.

## 2015-01-20 DIAGNOSIS — Z9849 Cataract extraction status, unspecified eye: Secondary | ICD-10-CM | POA: Diagnosis not present

## 2015-01-20 DIAGNOSIS — E119 Type 2 diabetes mellitus without complications: Secondary | ICD-10-CM | POA: Diagnosis not present

## 2015-01-20 DIAGNOSIS — Z961 Presence of intraocular lens: Secondary | ICD-10-CM | POA: Diagnosis not present

## 2015-01-20 LAB — HM DIABETES EYE EXAM

## 2015-01-22 ENCOUNTER — Other Ambulatory Visit: Payer: Self-pay | Admitting: Internal Medicine

## 2015-02-01 ENCOUNTER — Ambulatory Visit
Admission: RE | Admit: 2015-02-01 | Discharge: 2015-02-01 | Disposition: A | Discharge status: Home / Self Care | Payer: Medicare Other | Source: Ambulatory Visit | Attending: Family Medicine | Admitting: Family Medicine

## 2015-02-01 DIAGNOSIS — M19072 Primary osteoarthritis, left ankle and foot: Secondary | ICD-10-CM | POA: Diagnosis not present

## 2015-02-01 DIAGNOSIS — M25572 Pain in left ankle and joints of left foot: Secondary | ICD-10-CM

## 2015-02-01 DIAGNOSIS — M79672 Pain in left foot: Secondary | ICD-10-CM

## 2015-02-01 DIAGNOSIS — M7672 Peroneal tendinitis, left leg: Secondary | ICD-10-CM | POA: Diagnosis not present

## 2015-02-01 DIAGNOSIS — M669 Spontaneous rupture of unspecified tendon: Secondary | ICD-10-CM | POA: Diagnosis not present

## 2015-02-03 ENCOUNTER — Encounter: Payer: Self-pay | Admitting: Family Medicine

## 2015-02-03 ENCOUNTER — Ambulatory Visit (INDEPENDENT_AMBULATORY_CARE_PROVIDER_SITE_OTHER): Payer: Medicare Other | Admitting: Family Medicine

## 2015-02-03 ENCOUNTER — Telehealth: Payer: Self-pay | Admitting: Family Medicine

## 2015-02-03 ENCOUNTER — Encounter: Payer: Self-pay | Admitting: *Deleted

## 2015-02-03 VITALS — BP 120/72 | HR 64 | Ht 70.5 in | Wt 210.0 lb

## 2015-02-03 DIAGNOSIS — T380X5A Adverse effect of glucocorticoids and synthetic analogues, initial encounter: Secondary | ICD-10-CM | POA: Insufficient documentation

## 2015-02-03 DIAGNOSIS — I25119 Atherosclerotic heart disease of native coronary artery with unspecified angina pectoris: Secondary | ICD-10-CM | POA: Diagnosis not present

## 2015-02-03 DIAGNOSIS — M87075 Idiopathic aseptic necrosis of left foot: Secondary | ICD-10-CM

## 2015-02-03 DIAGNOSIS — M87176 Osteonecrosis due to drugs, unspecified foot: Secondary | ICD-10-CM | POA: Insufficient documentation

## 2015-02-03 DIAGNOSIS — M87175 Osteonecrosis due to drugs, left foot: Secondary | ICD-10-CM

## 2015-02-03 MED ORDER — VITAMIN D (ERGOCALCIFEROL) 1.25 MG (50000 UNIT) PO CAPS: 50000.0000 [IU] | ORAL_CAPSULE | ORAL | Status: DC

## 2015-02-03 MED ORDER — ALENDRONATE SODIUM 70 MG PO TABS: 70.0000 mg | ORAL_TABLET | ORAL | Status: DC

## 2015-02-03 NOTE — Patient Instructions (Addendum)
Good to see you and sorry for the bad news I believe you have loss some blood flow to the bone in your ankle and we will need to be quite conservative.  Avascular necrosis of the navicular bone.  No walking on it for 2 weeks Then boot for 2 weeks Fosamax weekly for next 12 weeks.  Vitamin D 50000 units weekly for next 8-12 weeks.  See me again in 3-4 weeks.

## 2015-02-03 NOTE — Assessment & Plan Note (Signed)
Likely more from post traumatic at this time.  HX of DM also will hurt.  NWB x 2 weeks then boot x 2 weeks, discussed will take 6-12 weeks  Discussed possible need for surgical intervention Fosamax and high dose Vitamin D given.  RTC in 3-4 weeks.  Spent  25 minutes with patient face-to-face and had greater than 50% of counseling including as described above in assessment and plan.

## 2015-02-03 NOTE — Progress Notes (Signed)
Pre visit review using our clinic review tool, if applicable. No additional management support is needed unless otherwise documented below in the visit note. 

## 2015-02-03 NOTE — Progress Notes (Signed)
  [name] [place], [place] [address] Phone: [phone] Subjective:    CC: Left foot pain  [id number] [name] is a 69 y.o. female coming in with complaint of left foot pain.  Patient was found to have more of a metatarsal fracture as well as diffuse degenerative changes of the foot. Patient has been put in custom orthotics has slowly started increasing her activity. Patient states unfortunately she continues to have pain. Patient was sent to have an MRI of the foot and is here for going over images. Patient's MRI has been reviewed previously and reviewed again today. 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 Patient's first metatarsal bone though seems to be healed at this time. This is consistent with what we were seen on ultrasound. Patient states that she continues to have pain mostly on a daily basis.        Past medical history, social, surgical and family history all reviewed in electronic medical record.   Review of Systems: No headache, visual changes, nausea, vomiting, diarrhea, constipation, dizziness, abdominal pain, skin rash, fevers, chills, night sweats, weight loss, swollen lymph nodes, body aches, joint swelling, muscle aches, chest pain, shortness of breath, mood changes.   Objective Blood pressure 120/72, pulse 64, height 5' 10.5" (1.791 m), weight 210 lb (95.255 kg), SpO2 97 %.  General: No apparent distress alert and oriented x3 mood and affect normal, dressed appropriately.  HEENT: Pupils equal, extraocular movements intact  Respiratory: Patient's speak in full sentences and does not appear short of breath  Cardiovascular: No lower extremity edema, non tender, no erythema  Skin: Warm dry intact with no signs of infection or rash on extremities or on axial skeleton.  Abdomen: Soft nontender  Neuro: Cranial nerves II through  XII are intact, neurovascularly intact in all extremities with 2+ DTRs and 2+ pulses.  Lymph: No lymphadenopathy of posterior or anterior cervical chain or axillae bilaterally.  Gait normal with good balance and coordination.  MSK:  Non tender with full range of motion and good stability and symmetric strength and tone of shoulders, elbows, wrist, hip, knee and ankles bilaterally.  Normal inspection with no visable or palpable fat pad atrophy and no visible swelling/erythema.  Left Foot exam shows the patient does have decreased swelling. No bruising noted more tender over the navicular bone. Less tender over the foot itself.       Impression and Recommendations:     This case required medical decision making of moderate complexity.

## 2015-02-03 NOTE — Telephone Encounter (Signed)
[name] came in looking for [facility] parking pass and trolley. i was unable to locate. Advised that i would notify you for monday

## 2015-02-06 NOTE — Telephone Encounter (Signed)
Ms Gonyer made aware that her pass & rx are at the front desk ready to be picked up.

## 2015-02-07 ENCOUNTER — Encounter: Payer: Self-pay | Admitting: Internal Medicine

## 2015-02-07 ENCOUNTER — Ambulatory Visit: Payer: Medicare Other | Admitting: Family Medicine

## 2015-02-09 ENCOUNTER — Telehealth: Payer: Self-pay | Admitting: Endocrinology

## 2015-02-09 NOTE — Telephone Encounter (Signed)
If her sugars are doing fairly well then she can wait another month to see me.  We need to see her if she is having frequent low sugars

## 2015-02-09 NOTE — Telephone Encounter (Signed)
Pt has appt on 4/25 she is asking if Dr. [name] still wants to see her that day because as she recalls the appt was made for a fu post her starting her exercise routine. She has not been able to start an exercise routine due to her diagnosis of vascular necrosis in her foot so she is completely off her feet at this time. Please advise on what to do from here regarding her coming in.

## 2015-02-09 NOTE — Telephone Encounter (Signed)
Patient states that the last adjustment to her insulin has improved her glucose readings.  She will call back for a follow up appointment.

## 2015-02-16 ENCOUNTER — Encounter: Payer: Self-pay | Admitting: Family Medicine

## 2015-02-21 ENCOUNTER — Other Ambulatory Visit: Payer: Self-pay | Admitting: Endocrinology

## 2015-02-23 ENCOUNTER — Encounter: Payer: Self-pay | Admitting: Family Medicine

## 2015-02-24 ENCOUNTER — Encounter: Payer: Self-pay | Admitting: Endocrinology

## 2015-02-27 ENCOUNTER — Ambulatory Visit: Payer: Medicare Other | Admitting: Endocrinology

## 2015-03-03 ENCOUNTER — Ambulatory Visit (INDEPENDENT_AMBULATORY_CARE_PROVIDER_SITE_OTHER): Payer: Medicare Other | Admitting: Family Medicine

## 2015-03-03 ENCOUNTER — Encounter: Payer: Self-pay | Admitting: Family Medicine

## 2015-03-03 VITALS — BP 124/82 | HR 82 | Ht 70.5 in | Wt 210.0 lb

## 2015-03-03 DIAGNOSIS — T380X5A Adverse effect of glucocorticoids and synthetic analogues, initial encounter: Principal | ICD-10-CM

## 2015-03-03 DIAGNOSIS — M87075 Idiopathic aseptic necrosis of left foot: Secondary | ICD-10-CM

## 2015-03-03 DIAGNOSIS — I25119 Atherosclerotic heart disease of native coronary artery with unspecified angina pectoris: Secondary | ICD-10-CM | POA: Diagnosis not present

## 2015-03-03 DIAGNOSIS — M87175 Osteonecrosis due to drugs, left foot: Secondary | ICD-10-CM

## 2015-03-03 NOTE — Patient Instructions (Signed)
Good to see you I am so impressed keep it up!!!! One more week on the crutches,  And on mothers day start the boot full time. Wear boot for 2 weeks. Continue the medicines See me again 3 weeks. If doing well then will consider a shoe :)

## 2015-03-03 NOTE — Assessment & Plan Note (Signed)
Patient is doing much better at this time. Encourage patient to continue to be nonweightbearing for another week. Then patient will transition into a Cam Walker that she has in the car. We discussed continuing all other conservative therapy including medications. We may need to consider repeat x-ray either this visit are the following visit. Patient and will come back and see me again in 3 weeks for further evaluation and treatment.

## 2015-03-03 NOTE — Progress Notes (Signed)
  [name] [place],  [address] Phone: [phone] Subjective:    CC: Left foot pain  [id number] [name] is a 69 y.o. female coming in with complaint of left foot pain.  Patient was found to have more of a metatarsal fracture as well as diffuse degenerative changes of the foot. Patient has been put in custom orthotics has slowly started increasing her activity.  Patient continued to have pain . Patient's MRI has been reviewed previously and reviewed again today. 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 Patient's first metatarsal bone though seems to be healed . Patient wanted to try conservative therapy for the navicular bone in patient was nonweightbearing for 4 weeks. Patient is also started on Fosamax and vitamin D. Patient is feeling significantly better overall. Patient states that over the course last 3 weeks it has continued to improve. Patient continues to do some icing. Would state that she is 35-45% better.        Past medical history, social, surgical and family history all reviewed in electronic medical record.   Review of Systems: No headache, visual changes, nausea, vomiting, diarrhea, constipation, dizziness, abdominal pain, skin rash, fevers, chills, night sweats, weight loss, swollen lymph nodes, body aches, joint swelling, muscle aches, chest pain, shortness of breath, mood changes.   Objective Blood pressure 124/82, pulse 82, height 5' 10.5" (1.791 m), weight 210 lb (95.255 kg), SpO2 97 %.  General: No apparent distress alert and oriented x3 mood and affect normal, dressed appropriately.  HEENT: Pupils equal, extraocular movements intact  Respiratory: Patient's speak in full sentences and does not appear short of breath  Cardiovascular: No lower extremity edema, non tender, no erythema  Skin: Warm dry intact with no signs  of infection or rash on extremities or on axial skeleton.  Abdomen: Soft nontender  Neuro: Cranial nerves II through XII are intact, neurovascularly intact in all extremities with 2+ DTRs and 2+ pulses.  Lymph: No lymphadenopathy of posterior or anterior cervical chain or axillae bilaterally.  Gait normal with good balance and coordination.  MSK:  Non tender with full range of motion and good stability and symmetric strength and tone of shoulders, elbows, wrist, hip, knee and ankles bilaterally.  Normal inspection with no visable or palpable fat pad atrophy and no visible swelling/erythema.  Left Foot exam shows the patient does no swelling.  No bruising noted and significant a less tender over the navicular bone. Less tender over the foot itself. Patient has good range of motion of the ankle. Neurovascular intact distally.       Impression and Recommendations:     This case required medical decision making of moderate complexity.

## 2015-03-03 NOTE — Progress Notes (Signed)
Pre visit review using our clinic review tool, if applicable. No additional management support is needed unless otherwise documented below in the visit note. 

## 2015-03-10 ENCOUNTER — Encounter: Payer: Self-pay | Admitting: Internal Medicine

## 2015-03-16 ENCOUNTER — Encounter: Payer: Self-pay | Admitting: Cardiovascular Disease

## 2015-03-16 ENCOUNTER — Ambulatory Visit (INDEPENDENT_AMBULATORY_CARE_PROVIDER_SITE_OTHER): Payer: Medicare Other | Admitting: Cardiovascular Disease

## 2015-03-16 VITALS — BP 143/69 | HR 63 | Ht 70.5 in | Wt 211.0 lb

## 2015-03-16 DIAGNOSIS — I25119 Atherosclerotic heart disease of native coronary artery with unspecified angina pectoris: Secondary | ICD-10-CM | POA: Diagnosis not present

## 2015-03-16 DIAGNOSIS — E785 Hyperlipidemia, unspecified: Secondary | ICD-10-CM | POA: Diagnosis not present

## 2015-03-16 DIAGNOSIS — I483 Typical atrial flutter: Secondary | ICD-10-CM

## 2015-03-16 DIAGNOSIS — I2510 Atherosclerotic heart disease of native coronary artery without angina pectoris: Secondary | ICD-10-CM

## 2015-03-16 DIAGNOSIS — E1169 Type 2 diabetes mellitus with other specified complication: Secondary | ICD-10-CM | POA: Insufficient documentation

## 2015-03-16 DIAGNOSIS — E1069 Type 1 diabetes mellitus with other specified complication: Secondary | ICD-10-CM | POA: Insufficient documentation

## 2015-03-16 NOTE — Patient Instructions (Addendum)

## 2015-03-16 NOTE — Progress Notes (Signed)
[name] Date of Birth  05/17/1946 [place] [address]    [name]   [name], [place]  [address]    [place], [place]  [address] [phone]  Fax  [phone]  [phone]  Fax [phone]   Problems. 1. CAD 2. Atrial Flutter - s/p Aflutter ablation Nov. 2016.  3. Diabetes Mellitus.   History of Present Illness:  [name] is doing very well. She's not having episodes of chest pain or shortness of breath. She's been able to below of her normal activities without any significant problems. She has not had chest pains .  Her insurance will run out the month before she goes on Medicare.   She has not had any CP and no palpitations.    April 08, 2013:   [name] is doing well. No CP.  Rhythm is stable.     Mar 11, 2014:  [name] is doing well.  She had a atrial flutter ablation several months ago.  She has not been taking her metoprolol..  Nov. 9, 2015:  [name] is doing well. No Cp , no dyspnea.  Active, not exerciseing as much as she would like.  Mar 16, 2015:   [name] is doing well.   Doing well from a cardiac standpoint.   Current Outpatient Prescriptions on File Prior to Visit  Medication Sig Dispense Refill  . alendronate (FOSAMAX) 70 MG tablet Take 1 tablet (70 mg total) by mouth every 7 (seven) days. Take with a full glass of water on an empty stomach. 12 tablet 0  . aspirin 325 MG tablet Take 325 mg by mouth daily.    . Calcium Carbonate-Vitamin D (CALCIUM + D PO) Take 600 mg by mouth 2 (two) times daily.     . Cholecalciferol (VITAMIN D PO) Take 1,000 mg by mouth 2 (two) times daily.     . clopidogrel (PLAVIX) 75 MG tablet Take 1 tablet by mouth  daily 90 tablet 1  . doxycycline (VIBRAMYCIN) 100 MG capsule Take 100 mg by mouth daily.   11  . HUMALOG 100 UNIT/ML injection USE MAX 200 UNITS PER DAY WITH INSULIN PUMP 90 mL 1  . ibuprofen (ADVIL,MOTRIN) 200 MG tablet Take 200 mg by mouth every 6 (six) hours as  needed.    . Insulin Aspart (INSULIN PUMP) 100 unit/ml SOLN Inject into the skin. Novolog Vial, max 200 units per day with pump    . insulin lispro (HUMALOG) 100 UNIT/ML injection Use max 200 units per day with insulin pump 9 vial 2  . metFORMIN (GLUCOPHAGE-XR) 500 MG 24 hr tablet Take 2 tablets (1,000 mg total) by mouth daily with breakfast. 180 tablet 1  . metoprolol succinate (TOPROL-XL) 100 MG 24 hr tablet Take 1 tablet (100 mg total) by mouth daily. 90 tablet 3  . metroNIDAZOLE (METROGEL) 0.75 % gel   11  . niacin (NIASPAN) 1000 MG CR tablet Take 1 tablet (1,000 mg total) by mouth at bedtime. 90 tablet 3  . nitroGLYCERIN (NITROSTAT) 0.4 MG SL tablet Place 1 tablet (0.4 mg total) under the tongue every 5 (five) minutes as needed. 25 tablet 11  . omeprazole (PRILOSEC) 20 MG capsule Take 1 capsule (20 mg total) by mouth 2 (two) times daily between meals as needed. 60 capsule 1  . omeprazole (PRILOSEC) 20 MG capsule TAKE 1 CAPSULE (20 MG TOTAL) BY MOUTH 2 (TWO) TIMES DAILY BEFORE A MEAL. 60 capsule 3  .  pioglitazone (ACTOS) 15 MG tablet Take 1 tablet (15 mg total) by mouth daily. 90 tablet 1  . rosuvastatin (CRESTOR) 40 MG tablet Take 1 tablet (40 mg total) by mouth daily. 90 tablet 1  . tretinoin (RETIN-A) 0.1 % cream   11  . Vitamin D, Ergocalciferol, (DRISDOL) 50000 UNITS CAPS capsule Take 1 capsule (50,000 Units total) by mouth every 7 (seven) days. 8 capsule 0   No current facility-administered medications on file prior to visit.    Allergies  Allergen Reactions  . Septra [Bactrim] Rash  . Sulfa Antibiotics Rash    Past Medical History  Diagnosis Date  . Coronary artery disease     2009 LAD Promus stent  . Hyperlipidemia   . Osteopenia   . Arthritis   . Type 1 diabetes mellitus on insulin therapy   . Fibroid   . Atrial flutter     a. s/p TEE/DCCV 08/16/13 (normal LV function, no LAA thrombus).b. s/p ablation by Dr Lovena Le 09-23-2013  . Valvular heart disease     a. Mild MR/TR  by TEE 08/2013.    Past Surgical History  Procedure Laterality Date  . Coronary angioplasty with stent placement    . Pelvic laparoscopy    . Myomectomy  1977  . Appendectomy    . Cataract extraction Bilateral   . Tee without cardioversion N/A 08/16/2013    Procedure: TRANSESOPHAGEAL ECHOCARDIOGRAM (TEE);  Surgeon: Thayer Headings, MD;  Location: [facility];  Service: Cardiovascular;  Laterality: N/A;  . Cardioversion N/A 08/16/2013    Procedure: CARDIOVERSION;  Surgeon: Thayer Headings, MD;  Location: [facility];  Service: Cardiovascular;  Laterality: N/A;  spoke with [name]  . Ablation  09-23-2013    CTI by Dr Lovena Le  . Colonoscopy    . Atrial flutter ablation N/A 09/23/2013    Procedure: ATRIAL FLUTTER ABLATION;  Surgeon: Evans Lance, MD;  Location: [facility] CATH LAB;  Service: Cardiovascular;  Laterality: N/A;    History  Smoking status  . Never Smoker   Smokeless tobacco  . Never Used    History  Alcohol Use  . 1.2 oz/week  . 2 Glasses of wine per week    Family History  Problem Relation Age of Onset  . Atrial fibrillation Mother   . Diabetes Mother   . Hypertension Mother   . CAD Father 4    died age 32  . CAD Brother 44    small vessel disease  . CAD Cousin     paternal  . CAD Paternal Grandfather     early onset  . Colon cancer Neg Hx     Reviw of Systems:  Reviewed in the HPI.  All other systems are negative.  Physical Exam: Blood pressure 143/69, pulse 63, height 5' 10.5" (1.791 m), weight 95.709 kg (211 lb). General: Well developed, well nourished, in no acute distress.  Head: Normocephalic, atraumatic, sclera non-icteric, mucus membranes are moist,   Neck: Supple. Negative for carotid bruits. JVD not elevated.  Lungs: Clear bilaterally to auscultation without wheezes, rales, or rhonchi. Breathing is unlabored.  Heart: RRR with S1 S2. No murmurs, rubs, or gallops appreciated.  Abdomen: Soft, non-tender, non-distended with normoactive bowel  sounds. No hepatomegaly. No rebound/guarding. No obvious abdominal masses.  Msk:  Strength and tone appear normal for age.  Extremities: No clubbing or cyanosis. No edema.  Distal pedal pulses are 2+ and equal bilaterally.  Neuro: Alert and oriented X 3. Moves all extremities spontaneously.  Psych:  Responds to questions appropriately with a normal affect.  ECG: Mar 16, 2015:  Normal sinus rhythm at 63. She has nonspecific ST and T wave changes.  Assessment / Plan:   1. CAD - doing well.  No angina   2. Atrial Flutter - s/p Aflutter ablation Nov. 2016.   3. Diabetes Mellitus.  4. HYperlipidemia:  wil check fasting labs in 6 months at her next office visit.  Continue crestor.     Sharlyn Odonnel, Wonda Cheng, MD  03/16/2015 4:33 PM    [place],  [place], [place]  [address] Pager [phone] Phone: [phone]; Fax: [phone]   [facility]  [address] [place], [place]  [address] [phone]    Fax [phone]

## 2015-03-21 ENCOUNTER — Telehealth: Payer: Self-pay | Admitting: Endocrinology

## 2015-03-21 ENCOUNTER — Ambulatory Visit: Payer: Medicare Other | Admitting: Endocrinology

## 2015-03-21 ENCOUNTER — Other Ambulatory Visit: Payer: Self-pay | Admitting: *Deleted

## 2015-03-21 ENCOUNTER — Other Ambulatory Visit: Payer: Self-pay | Admitting: Cardiovascular Disease

## 2015-03-21 MED ORDER — PIOGLITAZONE HCL 15 MG PO TABS: 15.0000 mg | ORAL_TABLET | Freq: Every day | ORAL | Status: DC

## 2015-03-21 NOTE — Telephone Encounter (Signed)
rx sent

## 2015-03-21 NOTE — Telephone Encounter (Signed)
Patient called and would like a month supply sent to local pharmacy and send Rx to [facility]   Rx: Actos 15 mg   Pharmacy: [facility]    Thank you

## 2015-03-22 ENCOUNTER — Encounter: Payer: Self-pay | Admitting: Endocrinology

## 2015-03-22 ENCOUNTER — Ambulatory Visit (INDEPENDENT_AMBULATORY_CARE_PROVIDER_SITE_OTHER): Payer: Medicare Other | Admitting: Endocrinology

## 2015-03-22 VITALS — BP 126/82 | HR 77 | Temp 98.3°F | Resp 14 | Ht 70.5 in | Wt 204.0 lb

## 2015-03-22 DIAGNOSIS — E782 Mixed hyperlipidemia: Secondary | ICD-10-CM

## 2015-03-22 DIAGNOSIS — E1065 Type 1 diabetes mellitus with hyperglycemia: Secondary | ICD-10-CM | POA: Diagnosis not present

## 2015-03-22 DIAGNOSIS — IMO0002 Reserved for concepts with insufficient information to code with codable children: Secondary | ICD-10-CM

## 2015-03-22 DIAGNOSIS — I25119 Atherosclerotic heart disease of native coronary artery with unspecified angina pectoris: Secondary | ICD-10-CM

## 2015-03-22 NOTE — Progress Notes (Addendum)
Patient ID: [name], female   DOB: [birth year], 69 y.o.   MRN: [id number]   Reason for visit: DIABETES followup.   HISTORY of present illness:  Diagnosis: Type 1 diabetes mellitus, date of diagnosis: 1979.   Insulin Pump: CURRENT brand: One Touch Ping Midnight = 1.3, 2 AM = 0.9, 5 PM =   2.4, 7 PM = 2.7 and 10 PM = 2.5   The pump settings are Basal rate: Midnight = 1.5. 2 AM = 1.0.  5 AM = 1.8,  8 AM = 1.85, 12 noon = 1.2 and 5 PM = 2.4, 7 PM = 2.7  Carbohydrate ratio 1:9 lunch and 1:5 at dinner; sensitivity 1: 35, 30 after 5 p.m.; target 120 +/-10  Insulin on board indicator on, duration 4 hrs   DIABETES: She has had long-standing diabetes with A1c levels usually above target. She had relatively high readings in 2013.  In 10/13 ACTOS was restarted because of her large insulin requirement and diabetic dyslipidemia.  Her blood sugars improved significantly and basal rates were reduced. Her best A1c was 6.9.   RECENT history:  She is here for short-term follow-up because of problems with excessive hypoglycemia on her last visit Her A1c also had increased somewhat to 7.4 However clear why this is higher since she is recently getting more hypoglycemia than before  Current blood sugar patterns and problems identified:  She has the incorrect date on her pump and monitor and not able to get any data  She does have data from her continuous glucose monitoring for the last week  Blood sugars reviewed in detail from her CGM show significant variability at all times as follows:  Overnight blood sugars are quite variable with persistently LOW readings last night between about 11:30 PM until at least 4 AM this morning.  Also had significantly low readings early morning on Monday until about 3 AM and also transiently on Sunday also  She did have prolonged high readings on at least 2 of the nights which appeared to be a continuation of the high readings after the evening meal the night  before  Does have HYPOGLYCEMIA on a couple of occasions after about 10:30-11 PM  POSTPRANDIAL blood sugars are occasionally higher after lunch notably on Sunday when she was around 300 a couple of hours after eating as also had prolonged and significant [place] readings on 3 of the 5 nights that for reviewed on her CGM.  She thinks that higher fat meals like cornbread makes her sugars go up  is tending to get frequent and significantly low glucose readings overnight and before supper time recently.  Not clear why she is starting get these low sugars as she has been relatively inactive and has not changed her diet in the evenings or at bedtime  Blood sugars before breakfast are variable but usually are excellent and before lunch and also variable before supper in the last few days  She thinks that she is using a dual wave for foods like pasta or fried food but on reviewing her settings she has used this only for 30 minutes  She is not doing any formal exercise because of foot problems but has not gained any weight COMPLIANCE with boluses has been fairly good although because of tendency to low sugars she may sometimes skip the bolus, not many boluses entered at lunch lately  Monitors blood glucose: on an average 4.5 times a day according to pump download.  Blood Glucose readings on  her monitor are not at the right date and has no readings before 4/21:   Overall median blood sugar was 118 with about 15% in the low range and 50% above target Blood sugars overall highest midmorning and midafternoon with greatest number of low sugars around supper time  Statistics from CGM as follows: Overall average 153+/-66  with lowest reading below 40 and highest reading 338 1 and average the highest blood sugar was at 9 PM of 186 and the lowest reading of 121 at 12 PM  Hypoglycemic awareness: Has symptoms of feeling tired, headache, sweaty. Variable recognition threshold present  Sometimes may not be aware  during the night and will be notified by her sensor that the sugar is low  She has symptoms when blood glucose is less than 60. Uses glucose tablets for treatment when not at home but otherwise eats a snack .   Meals: 3 meals per day.  DIET has been relatively inconsistent overall and her mealtimes are variable. Frequently will not eat breakfast.  Usually trying to have low fat meals  Food preferences: Yogurt And cottage cheese for breakfast usually.  Physical activity: exercise: none, was upto 45 min occ in ams. Historically after exercise blood sugar will get lower in 1-2 hrs. .  Certified Diabetes Educator visit: Most recent: 8/12.   She is usually regular with her eye exams  Wt Readings from Last 3 Encounters:  03/22/15 204 lb (92.534 kg)  03/16/15 211 lb (95.709 kg)  03/03/15 210 lb (95.255 kg)    LABS:  Lab Results  Component Value Date   HGBA1C 7.4* 01/11/2015   HGBA1C 7.0* 10/13/2014   HGBA1C 7.2* 07/12/2014   Lab Results  Component Value Date   MICROALBUR 0.2 04/19/2014   LDLCALC 53 10/13/2014   CREATININE 0.76 01/11/2015    No visits with results within 1 Week(s) from this visit. Latest known visit with results is:  Abstract on 02/03/2015  Component Date Value Ref Range Status  . HM Diabetic Eye Exam 01/20/2015 No Retinopathy  No Retinopathy Final      Medication List       This list is accurate as of: 03/22/15  2:29 PM.  Always use your most recent med list.               alendronate 70 MG tablet  Commonly known as:  FOSAMAX  Take 1 tablet (70 mg total) by mouth every 7 (seven) days. Take with a full glass of water on an empty stomach.     aspirin 325 MG tablet  Take 325 mg by mouth daily.     CALCIUM + D PO  Take 600 mg by mouth 2 (two) times daily.     clopidogrel 75 MG tablet  Commonly known as:  PLAVIX  Take 1 tablet by mouth  daily     doxycycline 100 MG capsule  Commonly known as:  VIBRAMYCIN  Take 100 mg by mouth daily.      ibuprofen 200 MG tablet  Commonly known as:  ADVIL,MOTRIN  Take 200 mg by mouth every 6 (six) hours as needed.     insulin lispro 100 UNIT/ML injection  Commonly known as:  HUMALOG  Use max 200 units per day with insulin pump     HUMALOG 100 UNIT/ML injection  Generic drug:  insulin lispro  USE MAX 200 UNITS PER DAY WITH INSULIN PUMP     insulin pump Soln  Inject into the skin. Novolog Vial, max 200  units per day with pump     metFORMIN 500 MG 24 hr tablet  Commonly known as:  GLUCOPHAGE-XR  Take 2 tablets (1,000 mg total) by mouth daily with breakfast.     metoprolol succinate 100 MG 24 hr tablet  Commonly known as:  TOPROL-XL  TAKE 1 TABLET (100 MG TOTAL) BY MOUTH DAILY.     metroNIDAZOLE 0.75 % gel  Commonly known as:  METROGEL     niacin 1000 MG CR tablet  Commonly known as:  NIASPAN  Take 1 tablet (1,000 mg total) by mouth at bedtime.     nitroGLYCERIN 0.4 MG SL tablet  Commonly known as:  NITROSTAT  Place 1 tablet (0.4 mg total) under the tongue every 5 (five) minutes as needed.     omeprazole 20 MG capsule  Commonly known as:  PRILOSEC  Take 1 capsule (20 mg total) by mouth 2 (two) times daily between meals as needed.     omeprazole 20 MG capsule  Commonly known as:  PRILOSEC  TAKE 1 CAPSULE (20 MG TOTAL) BY MOUTH 2 (TWO) TIMES DAILY BEFORE A MEAL.     pioglitazone 15 MG tablet  Commonly known as:  ACTOS  Take 1 tablet (15 mg total) by mouth daily.     rosuvastatin 40 MG tablet  Commonly known as:  CRESTOR  Take 1 tablet (40 mg total) by mouth daily.     tretinoin 0.1 % cream  Commonly known as:  RETIN-A     Vitamin D (Ergocalciferol) 50000 UNITS Caps capsule  Commonly known as:  DRISDOL  Take 1 capsule (50,000 Units total) by mouth every 7 (seven) days.     VITAMIN D PO  Take 1,000 mg by mouth 2 (two) times daily.        Allergies:  Allergies  Allergen Reactions  . Septra [Bactrim] Rash  . Sulfa Antibiotics Rash    Past Medical History   Diagnosis Date  . Coronary artery disease     2009 LAD Promus stent  . Hyperlipidemia   . Osteopenia   . Arthritis   . Type 1 diabetes mellitus on insulin therapy   . Fibroid   . Atrial flutter     a. s/p TEE/DCCV 08/16/13 (normal LV function, no LAA thrombus).b. s/p ablation by Dr Lovena Le 09-23-2013  . Valvular heart disease     a. Mild MR/TR by TEE 08/2013.    Past Surgical History  Procedure Laterality Date  . Coronary angioplasty with stent placement    . Pelvic laparoscopy    . Myomectomy  1977  . Appendectomy    . Cataract extraction Bilateral   . Tee without cardioversion N/A 08/16/2013    Procedure: TRANSESOPHAGEAL ECHOCARDIOGRAM (TEE);  Surgeon: Thayer Headings, MD;  Location: [facility];  Service: Cardiovascular;  Laterality: N/A;  . Cardioversion N/A 08/16/2013    Procedure: CARDIOVERSION;  Surgeon: Thayer Headings, MD;  Location: [place];  Service: Cardiovascular;  Laterality: N/A;  spoke with [name]  . Ablation  09-23-2013    CTI by Dr Lovena Le  . Colonoscopy    . Atrial flutter ablation N/A 09/23/2013    Procedure: ATRIAL FLUTTER ABLATION;  Surgeon: Evans Lance, MD;  Location: [facility] CATH LAB;  Service: Cardiovascular;  Laterality: N/A;    Family History  Problem Relation Age of Onset  . Atrial fibrillation Mother   . Diabetes Mother   . Hypertension Mother   . CAD Father 23    died age 79  .  CAD Brother 16    small vessel disease  . CAD Cousin     paternal  . CAD Paternal Grandfather     early onset  . Colon cancer Neg Hx     Social History:  reports that she has never smoked. She has never used smokeless tobacco. She reports that she drinks about 1.2 oz of alcohol per week. She reports that she does not use illicit drugs.  REVIEW of systems:  She has a history of coronary disease   She has had diabetic dyslipidemia with significantly high LDL particle number and this has been managed with Crestor and Niaspan   In 3/16 her particle number was  below 1000   Lab Results  Component Value Date   CHOL 147 10/13/2014   HDL 55.50 10/13/2014   LDLCALC 53 10/13/2014   LDLDIRECT 79.0 01/11/2015   TRIG 192.0* 10/13/2014   CHOLHDL 3 10/13/2014   No significant hypertension and has had atrial arrhythmias treated with metoprolol She had atrial fibrillation ablation performed with success    BP 126/82 mmHg  Pulse 77  Temp(Src) 98.3 F (36.8 C)  Resp 14  Ht 5' 10.5" (1.791 m)  Wt 204 lb (92.534 kg)  BMI 28.85 kg/m2  SpO2 95%   ASSESSMENT/PLAN:  DIABETES type 1 with fair control, A1c is reasonably good at 7.0 given the difficulty of her diabetes control  See history of present illness for detailed discussion of her current blood sugar patterns and management with her pump and interpretation of her continuous glucose sensor Hypoglycemia: She appears to be having this late at night and this may be because of her significantly high basal rate even after supper but also this is inconsistent when she has a high fat meal Also has at least 2 episodes of significant hypoglycemia between about midnight-4 AM probably indicating that she has an excessive basal rate on the she is eating high-fat meal the night before  HYPERGLYCEMIA: This is usually related to higher fat meals both at lunch and supper and appears to be prolonged after supper as discussed above This is because of not adding extra insulin for the high-fat and not extending the bolus as instructed before; she still needs more education regarding this but instructed her to use the extended bolus for at least 2 hours with high fat meals However since she desires some weight loss she could try to improve her diet also  Discussed with the patient that she should inform us if she is having difficulties with persistent and frequent hypoglycemia especially at night  She will reduce her basal rate at 10 PM to 2.2 and at midnight 1.2 for now Continue using the continuous glucose sensor  to help her identify abnormal patterns and to call the manufacturer if she has any issues with the alarms not going off She was shown how to change the date and time in her pump and her meter and she will call the manufacturer if she has continuous problems with this She will also follow-up with nurse educator to review day-to-day management again  Hyperlipidemia:  Needs periodic follow-up  Counseling time over 50% of today's 25 minute visiton diabetes management as above  Patient Instructions  Midnight = 1.2,  and 10 PM = 2.2  EXTEND COMBO BOLUS UPTO 3HRS FOR HI fat meals, cornbread  Extra boluses for snacks over 15g carbs     [name]

## 2015-03-22 NOTE — Patient Instructions (Signed)
Midnight = 1.2,  and 10 PM = 2.2  EXTEND COMBO BOLUS UPTO 3HRS FOR HI fat meals, cornbread  Extra boluses for snacks over 15g carbs

## 2015-03-24 ENCOUNTER — Ambulatory Visit (INDEPENDENT_AMBULATORY_CARE_PROVIDER_SITE_OTHER): Payer: Medicare Other | Admitting: Family Medicine

## 2015-03-24 ENCOUNTER — Encounter: Payer: Self-pay | Admitting: Family Medicine

## 2015-03-24 VITALS — BP 120/70 | HR 68 | Ht 70.5 in | Wt 211.0 lb

## 2015-03-24 DIAGNOSIS — M87075 Idiopathic aseptic necrosis of left foot: Secondary | ICD-10-CM | POA: Diagnosis not present

## 2015-03-24 DIAGNOSIS — I25119 Atherosclerotic heart disease of native coronary artery with unspecified angina pectoris: Secondary | ICD-10-CM

## 2015-03-24 DIAGNOSIS — T380X5A Adverse effect of glucocorticoids and synthetic analogues, initial encounter: Principal | ICD-10-CM

## 2015-03-24 DIAGNOSIS — M87175 Osteonecrosis due to drugs, left foot: Secondary | ICD-10-CM

## 2015-03-24 NOTE — Patient Instructions (Signed)
Good to see you Continue the boot out of the house for another 2 weeks.  OK to be in shoe at home Orthotics are good but start 1 hour at first and increase slowly.  3rd week then try in regular shoe See me again in 3 weeks and we can get you going.

## 2015-03-24 NOTE — Progress Notes (Signed)
Pre visit review using our clinic review tool, if applicable. No additional management support is needed unless otherwise documented below in the visit note. 

## 2015-03-24 NOTE — Progress Notes (Signed)
  [name] [place], [place] [address] Phone: [phone] Subjective:    CC: Left foot pain  DTO:[id number] [name] is a 69 y.o. female coming in with complaint of left foot pain.  Patient was found to have more of a metatarsal fracture as well as diffuse degenerative changes of the foot. Patient has been put in custom orthotics has slowly started increasing her activity.  Patient continued to have pain Patient's MRI has been reviewed previously and reviewed again today. 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 Patient's first metatarsal bone though seems to be healed . Patient is now been in the boot for a total of 9 weeks. Patient is feeling significantly better. Patient continues with the Fosamax as well as weekly vitamin D dosing. Patient states that this is the best it is felt in quite a while. Patient did try wearing shoes one time since last visit and did have some mild aching but nothing that she which really call pain.      Past medical history, social, surgical and family history all reviewed in electronic medical record.   Review of Systems: No headache, visual changes, nausea, vomiting, diarrhea, constipation, dizziness, abdominal pain, skin rash, fevers, chills, night sweats, weight loss, swollen lymph nodes, body aches, joint swelling, muscle aches, chest pain, shortness of breath, mood changes.   Objective Blood pressure 120/70, pulse 68, height 5' 10.5" (1.791 m), weight 211 lb (95.709 kg), SpO2 94 %.  General: No apparent distress alert and oriented x3 mood and affect normal, dressed appropriately.  HEENT: Pupils equal, extraocular movements intact  Respiratory: Patient's speak in full sentences and does not appear short of breath  Cardiovascular: No lower extremity edema, non tender, no erythema  Skin: Warm dry intact with no signs of  infection or rash on extremities or on axial skeleton.  Abdomen: Soft nontender  Neuro: Cranial nerves II through XII are intact, neurovascularly intact in all extremities with 2+ DTRs and 2+ pulses.  Lymph: No lymphadenopathy of posterior or anterior cervical chain or axillae bilaterally.  Gait normal with good balance and coordination.  MSK:  Non tender with full range of motion and good stability and symmetric strength and tone of shoulders, elbows, wrist, hip, knee and ankles bilaterally.  Normal inspection with no visable or palpable fat pad atrophy and no visible swelling/erythema.  Left Foot exam shows the patient does no swelling.  No bruising noted and significant a less tender over the navicular bone. Less tender over the foot itself. Patient has good range of motion of the ankle. Neurovascular intact distally. Continues to improve. Patient is able to ambulate and take 4 steps without any pain.       Impression and Recommendations:     This case required medical decision making of moderate complexity.

## 2015-03-24 NOTE — Assessment & Plan Note (Signed)
Continues to improve very well at this time. Patient will start wearing regular shoe in the house but continue to wear the boot for the next 2 weeks out of the house. Patient will continue with the bisphosphonate we will switch to daily oral vitamin D. We will do 4000 units daily. Patient and will come back and see me in 3 weeks. His lungs patient is doing well we'll advance accordingly.

## 2015-03-30 ENCOUNTER — Telehealth: Payer: Self-pay | Admitting: Endocrinology

## 2015-03-30 NOTE — Telephone Encounter (Signed)
Pt called in and said she has a bad

## 2015-03-30 NOTE — Telephone Encounter (Signed)
Noted, message left on patients vm 

## 2015-03-30 NOTE — Telephone Encounter (Signed)
Pt called in said she had a bad cold and wanted tokno what she could take over the counter, please advise pt

## 2015-03-30 NOTE — Telephone Encounter (Signed)
See note below

## 2015-03-30 NOTE — Telephone Encounter (Signed)
Anything that works for her except the decongestants like pseudo-ephedrine/Sudafed

## 2015-03-30 NOTE — Telephone Encounter (Signed)
Please see below and advise.

## 2015-03-31 ENCOUNTER — Telehealth: Payer: Self-pay | Admitting: Endocrinology

## 2015-03-31 NOTE — Telephone Encounter (Signed)
[employer] sent pt a email about her supplies and would like you to fill out this "documentation" so they can approve and send her the supplies she needs.

## 2015-04-04 ENCOUNTER — Telehealth: Payer: Self-pay | Admitting: Endocrinology

## 2015-04-04 NOTE — Telephone Encounter (Signed)
Pt called returning Rhonda's phone call, please call pt back @[phone]

## 2015-04-14 ENCOUNTER — Ambulatory Visit (INDEPENDENT_AMBULATORY_CARE_PROVIDER_SITE_OTHER): Payer: Medicare Other | Admitting: Family Medicine

## 2015-04-14 ENCOUNTER — Encounter: Payer: Self-pay | Admitting: Family Medicine

## 2015-04-14 VITALS — BP 118/74 | HR 64 | Ht 70.5 in | Wt 210.0 lb

## 2015-04-14 DIAGNOSIS — I25119 Atherosclerotic heart disease of native coronary artery with unspecified angina pectoris: Secondary | ICD-10-CM | POA: Diagnosis not present

## 2015-04-14 DIAGNOSIS — M87075 Idiopathic aseptic necrosis of left foot: Secondary | ICD-10-CM

## 2015-04-14 DIAGNOSIS — M87175 Osteonecrosis due to drugs, left foot: Secondary | ICD-10-CM

## 2015-04-14 DIAGNOSIS — T380X5A Adverse effect of glucocorticoids and synthetic analogues, initial encounter: Principal | ICD-10-CM

## 2015-04-14 NOTE — Assessment & Plan Note (Signed)
She is doing significantly better at this time. Discussed with patient that she can start increasing her activity slowly. We discussed what activities such as running and jumping shouldn't the delayed still for another 2 months. Patient will continue with the vitamin D a high-dose for the next 6 weeks. Patient will finish the Fosamax over the course of the next 2 weeks. Patient did bring her shoes and we discussed which ones are appropriate. Patient and will come back and see me again in 6-8 weeks to make sure she continues to improve.  Spent  25 minutes with patient face-to-face and had greater than 50% of counseling including as described above in assessment and plan.

## 2015-04-14 NOTE — Progress Notes (Signed)
  [name] [place] [place], [place] [address] Phone: [phone] Subjective:    CC: Left foot pain  HMC:[id number] [name] is a 69 y.o. female coming in with complaint of left foot pain.  Patient was found to have more of a metatarsal fracture as well as diffuse degenerative changes of the foot. Patient has been put in custom orthotics has slowly started increasing her activity.  Patient continued to have pain Patient's MRI has been reviewed previously and reviewed again today. 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 Patient's first metatarsal bone though seems to be healed . Patient is out of the boot at this time and is feeling significantly better. Patient is not having any worsening pain. Patient states that the swelling is still continues though. Patient switched shoes on a regular basis and is switching in the sandals, the summer. Patient is leaving town and wants to make sure that she will be fine. States that she is a 90% better.      Past medical history, social, surgical and family history all reviewed in electronic medical record.   Review of Systems: No headache, visual changes, nausea, vomiting, diarrhea, constipation, dizziness, abdominal pain, skin rash, fevers, chills, night sweats, weight loss, swollen lymph nodes, body aches, joint swelling, muscle aches, chest pain, shortness of breath, mood changes.   Objective Blood pressure 118/74, pulse 64, height 5' 10.5" (1.791 m), weight 210 lb (95.255 kg), SpO2 97 %.  General: No apparent distress alert and oriented x3 mood and affect normal, dressed appropriately.  HEENT: Pupils equal, extraocular movements intact  Respiratory: Patient's speak in full sentences and does not appear short of breath  Cardiovascular: No lower extremity edema, non tender, no erythema  Skin: Warm dry intact with no signs  of infection or rash on extremities or on axial skeleton.  Abdomen: Soft nontender  Neuro: Cranial nerves II through XII are intact, neurovascularly intact in all extremities with 2+ DTRs and 2+ pulses.  Lymph: No lymphadenopathy of posterior or anterior cervical chain or axillae bilaterally.  Gait normal with good balance and coordination.  MSK:  Non tender with full range of motion and good stability and symmetric strength and tone of shoulders, elbows, wrist, hip, knee and ankles bilaterally.    Left Foot exam shows he is improving overall. Patient has still some soft tissue swelling on the dorsal aspect of the foot but no longer tender.       Impression and Recommendations:     This case required medical decision making of moderate complexity.

## 2015-04-14 NOTE — Progress Notes (Signed)
Pre visit review using our clinic review tool, if applicable. No additional management support is needed unless otherwise documented below in the visit note. 

## 2015-04-14 NOTE — Patient Instructions (Signed)
Verbal instructions given

## 2015-05-01 ENCOUNTER — Encounter: Payer: Medicare Other | Attending: Endocrinology | Admitting: Nutrition

## 2015-05-01 DIAGNOSIS — E1065 Type 1 diabetes mellitus with hyperglycemia: Secondary | ICD-10-CM | POA: Diagnosis not present

## 2015-05-01 DIAGNOSIS — Z713 Dietary counseling and surveillance: Secondary | ICD-10-CM | POA: Diagnosis not present

## 2015-05-01 DIAGNOSIS — IMO0002 Reserved for concepts with insufficient information to code with codable children: Secondary | ICD-10-CM

## 2015-05-01 DIAGNOSIS — Z794 Long term (current) use of insulin: Secondary | ICD-10-CM | POA: Diagnosis not present

## 2015-05-02 ENCOUNTER — Ambulatory Visit: Payer: Medicare Other | Admitting: Nutrition

## 2015-05-02 NOTE — Progress Notes (Signed)
Patient is most concerned about her weight gain.  Blood sugars are overall good.  FBSs 80-145, acL: 88-123, acS: 79-160 (high due to snacking acS).   CGM shows the basal rate very steady during the night, no fluxuation.  Last night HS was 143, 3AM: 141, 7:30AM: 144.  Suggested a correction dose be given for any readings over her target of 120.  She agreed to do this.  I watched her do a correction bolus (EZBG) bolus with no questions or help from me.  Patients to join weight watchers, and I agreed that she will need constant reinforcement. Strongly suggested the need to work up to a 1 hour exercise program, 5 days/wk, and reviewed the need to reduce pre bolus amounts if beginning exercise by 20%.  She reported good understanding of this.

## 2015-05-02 NOTE — Patient Instructions (Signed)
Begin an exercise program, start at 30 min. And work up to 45, or 1 hour, 5 days/wk Reduce pre meal bolus by at least 20% when exercising after the meal. Do a EZ bg bolus when HS readings are over 130

## 2015-05-04 ENCOUNTER — Other Ambulatory Visit: Payer: Self-pay | Admitting: Endocrinology

## 2015-05-20 ENCOUNTER — Other Ambulatory Visit: Payer: Self-pay | Admitting: Endocrinology

## 2015-05-20 ENCOUNTER — Other Ambulatory Visit: Payer: Self-pay | Admitting: Family Medicine

## 2015-05-22 ENCOUNTER — Other Ambulatory Visit: Payer: Self-pay

## 2015-05-26 ENCOUNTER — Encounter: Payer: Self-pay | Admitting: Family Medicine

## 2015-05-30 NOTE — Telephone Encounter (Signed)
Pt has finished the fosamax.  Refill denied.

## 2015-06-10 ENCOUNTER — Other Ambulatory Visit: Payer: Self-pay | Admitting: Endocrinology

## 2015-06-19 ENCOUNTER — Other Ambulatory Visit (INDEPENDENT_AMBULATORY_CARE_PROVIDER_SITE_OTHER): Payer: Medicare Other

## 2015-06-19 ENCOUNTER — Other Ambulatory Visit: Payer: Medicare Other

## 2015-06-19 ENCOUNTER — Other Ambulatory Visit: Payer: Self-pay | Admitting: Nurse Practitioner

## 2015-06-19 DIAGNOSIS — E1065 Type 1 diabetes mellitus with hyperglycemia: Secondary | ICD-10-CM | POA: Diagnosis not present

## 2015-06-19 DIAGNOSIS — E785 Hyperlipidemia, unspecified: Secondary | ICD-10-CM

## 2015-06-19 DIAGNOSIS — IMO0002 Reserved for concepts with insufficient information to code with codable children: Secondary | ICD-10-CM

## 2015-06-19 DIAGNOSIS — I483 Typical atrial flutter: Secondary | ICD-10-CM

## 2015-06-19 DIAGNOSIS — I2510 Atherosclerotic heart disease of native coronary artery without angina pectoris: Secondary | ICD-10-CM

## 2015-06-19 DIAGNOSIS — K209 Esophagitis, unspecified without bleeding: Secondary | ICD-10-CM

## 2015-06-19 LAB — MICROALBUMIN / CREATININE URINE RATIO
Creatinine,U: 161.6 mg/dL
Microalb Creat Ratio: 0.4 mg/g (ref 0.0–30.0)
Microalb, Ur: <0.7 mg/dL (ref 0.0–1.9)

## 2015-06-19 LAB — COMPREHENSIVE METABOLIC PANEL
ALK PHOS: 96 U/L (ref 39–117)
ALT: 17 U/L (ref 0–35)
AST: 16 U/L (ref 0–37)
Albumin: 3.6 g/dL (ref 3.5–5.2)
BILIRUBIN TOTAL: 0.5 mg/dL (ref 0.2–1.2)
BUN: 15 mg/dL (ref 6–23)
CALCIUM: 9 mg/dL (ref 8.4–10.5)
CO2: 29 meq/L (ref 19–32)
CREATININE: 0.75 mg/dL (ref 0.40–1.20)
Chloride: 105 mEq/L (ref 96–112)
GFR: 81.8 mL/min (ref >60.00)
GLUCOSE: 192 mg/dL — AB (ref 70–99)
Potassium: 4.1 mEq/L (ref 3.5–5.1)
Sodium: 140 mEq/L (ref 135–145)
TOTAL PROTEIN: 6.7 g/dL (ref 6.0–8.3)

## 2015-06-19 LAB — HEMOGLOBIN A1C: HEMOGLOBIN A1C: 7 % — AB (ref 4.6–6.5)

## 2015-06-22 ENCOUNTER — Encounter: Payer: Self-pay | Admitting: Endocrinology

## 2015-06-22 ENCOUNTER — Ambulatory Visit (INDEPENDENT_AMBULATORY_CARE_PROVIDER_SITE_OTHER): Payer: Medicare Other | Admitting: Endocrinology

## 2015-06-22 ENCOUNTER — Other Ambulatory Visit: Payer: Self-pay | Admitting: *Deleted

## 2015-06-22 VITALS — BP 128/78 | HR 68 | Temp 98.2°F | Resp 16 | Ht 70.5 in | Wt 205.8 lb

## 2015-06-22 DIAGNOSIS — I25119 Atherosclerotic heart disease of native coronary artery with unspecified angina pectoris: Secondary | ICD-10-CM

## 2015-06-22 DIAGNOSIS — IMO0002 Reserved for concepts with insufficient information to code with codable children: Secondary | ICD-10-CM

## 2015-06-22 DIAGNOSIS — E1065 Type 1 diabetes mellitus with hyperglycemia: Secondary | ICD-10-CM | POA: Diagnosis not present

## 2015-06-22 MED ORDER — GLUCOSE BLOOD VI STRP: ORAL_STRIP | Status: DC

## 2015-06-22 MED ORDER — INSULIN LISPRO 100 UNIT/ML ~~LOC~~ SOLN: SUBCUTANEOUS | Status: DC

## 2015-06-22 NOTE — Progress Notes (Signed)
Patient ID: [name], female   DOB: April 04, 1946, 69 y.o.   MRN: [id number]   Reason for visit: DIABETES followup. Diagnosis: Type 1 diabetes mellitus, date of diagnosis: 1979.    HISTORY of present illness:  Insulin Pump: CURRENT brand: One Touch Ping The pump settings are: Midnight = 1.2, 2 AM = 0.9, 5 AM = 1.8, 8 AM = 1.85, 12 noon = 1.2 and 5 PM = 2.4, 7 PM = 2.7 and 10 PM = 2.2.  Total daily basal = 41 unit   Carbohydrate ratio 1:9 lunch and 1:5 at dinner; sensitivity 1: 35, 30 after 5 p.m.; target 120 +/-10  Insulin on board indicator on, duration 4 hrs   DIABETES: She has had long-standing diabetes with A1c levels usually above target. She had relatively high readings in 2013.  In 10/13 ACTOS was restarted because of her large insulin requirement and diabetic dyslipidemia.  Her blood sugars improved significantly and basal rates were reduced. Her best A1c was 6.9.   RECENT history:  Her A1c previously had increased somewhat to 7.4 but is now back to 7% Also she was having excessive hypoglycemia including overnight on her last visit and settings were changed  Current blood sugar patterns and problems identified:  She does have data from her continuous glucose monitoring available and details are discussed below  Her overnight blood sugars are quite variable and on at least a couple of nights recently are persistently high but possibly a continuation of high readings from the night before  She has not had any further hypoglycemia during the night and with only one low normal reading at about 2 AM  FASTING blood sugars are generally high especially earlier in the morning; occasionally has had persistent high readings from the night before and today her fasting was 277  Although she is using the continuous glucose monitor she may not always respond to the alarms at night to deal with excessively HIGH or low readings overnight; has occasional boluses during the night  which may not always work  Her blood sugars postprandial are excellent at breakfast and lunch  Blood sugars after DINNER are generally high even with her 1:5 carbohydrate coverage and avoiding high-fat meals usually  No significant hypoglycemia recently with only a couple of readings in the 60s midday  She still has not started any exercise regimen  COMPLIANCE with boluses has been fairly good although difficult to assess because she often does not enter her carbohydrates at the time of her meals or snacks  Monitors blood glucose: on an average about 5 times a day according to pump download.  Blood Glucose readings on her monitor  Overall average blood sugar was 166+/-67 Highest overall average of 263 overnight and lowest average of 106 between 12-2 PM Also has relatively high average readings after 8 PM of 199 and 231 She has 59% within desired range, but he 7% above and 4% below the desired range on her download  CGM RECORD INTERPRETATION    Dates of Recording: 06/16/15 through 06/22/15  Sensor  summary:  Average blood glucose for the period of recording is 156 with standard deviation 46 Has 68% of her blood sugars in the high range, 4% low and 27 in the target range Has been calibrating 1.6 times a day      Glycemic patterns:  Hyperglycemic episodes are occurring mostly through the night on some days and also significantly high patterns after about 10 PM during the night  and between 7 AM-10 AM in the morning. Highest blood sugar on an average is 206 at 10 PM and lowest 107 at 6 PM   Hypoglycemic episodes have not been documented for this duration of recording      Overnight periods:   blood sugars are persistently and significantly high on at least 3 of her nights and in addition rising blood sugar after 2 AM on one of the nights. Blood sugar was transiently  low normal on 8/16 at 2-3 AM without hypoglycemia Her average blood sugar during the night hours ranges from 142 at 2 AM up  to 170 at 6 AM      Preprandial periods:   blood sugars around breakfast time are between 165 and 170 on an average, at lunchtime around 155-160 and at dinnertime which is between 5-7 PM average glucose ranges from 107-125      Postprandial periods:   blood sugars are significantly high after supper at least on the occasions and high on at least 2-3 occasions after lunch but they are relatively flat lower after breakfast      Hypoglycemia:  minimal as above       Hypoglycemic awareness: Has symptoms of feeling tired, headache, sweaty. Variable recognition threshold present  Sometimes may not be aware during the night and will be notified by her sensor that the sugar is low  She has symptoms when blood glucose is less than 60. Uses glucose tablets for treatment when not at home but otherwise eats a snack .    DIET has been relatively better recently overall and her mealtimes are variable. Frequently will not eat breakfast.  Usually trying to have low fat meals  Food preferences: Yogurt And cottage cheese for breakfast usually.  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  Historically after exercise blood sugar will get lower in 1-2 hrs. .  Certified Diabetes Educator visit: Most recent: 8/12.   She is usually regular with her eye exams  Wt Readings from Last 3 Encounters:  06/22/15 205 lb 12.8 oz (93.35 kg)  04/14/15 210 lb (95.255 kg)  03/24/15 211 lb (95.709 kg)    LABS:  Lab Results  Component Value Date   HGBA1C 7.0* 06/19/2015   HGBA1C 7.4* 01/11/2015   HGBA1C 7.0* 10/13/2014   Lab Results  Component Value Date   MICROALBUR <0.7 06/19/2015   LDLCALC 53 10/13/2014   CREATININE 0.75 06/19/2015    Lab on 06/19/2015  Component Date Value Ref Range Status  . Hgb A1c MFr Bld 06/19/2015 7.0* 4.6 - 6.5 % Final   Glycemic Control Guidelines for People with Diabetes:Non Diabetic:  <6%Goal of Therapy: <7%Additional  Action Suggested:  >8%   . Sodium 06/19/2015 140  135 - 145 mEq/L Final  . Potassium 06/19/2015 4.1  3.5 - 5.1 mEq/L Final  . Chloride 06/19/2015 105  96 - 112 mEq/L Final  . CO2 06/19/2015 29  19 - 32 mEq/L Final  . Glucose, Bld 06/19/2015 192* 70 - 99 mg/dL Final  . BUN 06/19/2015 15  6 - 23 mg/dL Final  . Creatinine, Ser 06/19/2015 0.75  0.40 - 1.20 mg/dL Final  . Total Bilirubin 06/19/2015 0.5  0.2 - 1.2 mg/dL Final  . Alkaline Phosphatase 06/19/2015 96  39 - 117 U/L Final  . AST 06/19/2015 16  0 - 37 U/L Final  . ALT 06/19/2015 17  0 - 35 U/L Final  . Total Protein  06/19/2015 6.7  6.0 - 8.3 g/dL Final  . Albumin 06/19/2015 3.6  3.5 - 5.2 g/dL Final  . Calcium 06/19/2015 9.0  8.4 - 10.5 mg/dL Final  . GFR 06/19/2015 81.80  >60.00 mL/min Final  . Microalb, Ur 06/19/2015 <0.7  0.0 - 1.9 mg/dL Final  . Creatinine,U 06/19/2015 161.6   Final  . Microalb Creat Ratio 06/19/2015 0.4  0.0 - 30.0 mg/g Final      Medication List       This list is accurate as of: 06/22/15  9:15 PM.  Always use your most recent med list.               alendronate 70 MG tablet  Commonly known as:  FOSAMAX  Take 1 tablet (70 mg total) by mouth every 7 (seven) days. Take with a full glass of water on an empty stomach.     aspirin 325 MG tablet  Take 325 mg by mouth daily.     CALCIUM + D PO  Take 600 mg by mouth 2 (two) times daily.     clopidogrel 75 MG tablet  Commonly known as:  PLAVIX  Take 1 tablet by mouth  daily     doxycycline 100 MG capsule  Commonly known as:  VIBRAMYCIN  Take 100 mg by mouth daily.     glucose blood test strip  Commonly known as:  ONE TOUCH ULTRA TEST  Use as instructed to check blood sugar 6 times per day dx code E10.65     HUMALOG 100 UNIT/ML injection  Generic drug:  insulin lispro  USE MAX 200 UNITS PER DAY WITH INSULIN PUMP     HUMALOG 100 UNIT/ML injection  Generic drug:  insulin lispro  USE MAX 200 UNITS PER DAY WITH INSULIN PUMP     insulin lispro  100 UNIT/ML injection  Commonly known as:  HUMALOG  Use max 200 units per day with insulin pump     ibuprofen 200 MG tablet  Commonly known as:  ADVIL,MOTRIN  Take 200 mg by mouth every 6 (six) hours as needed.     insulin pump Soln  Inject into the skin. Novolog Vial, max 200 units per day with pump     metFORMIN 500 MG 24 hr tablet  Commonly known as:  GLUCOPHAGE-XR  Take 2 tablets by mouth  daily     metoprolol succinate 100 MG 24 hr tablet  Commonly known as:  TOPROL-XL  TAKE 1 TABLET (100 MG TOTAL) BY MOUTH DAILY.     metroNIDAZOLE 0.75 % gel  Commonly known as:  METROGEL     niacin 1000 MG CR tablet  Commonly known as:  NIASPAN  Take 1 tablet by mouth at  bedtime     nitroGLYCERIN 0.4 MG SL tablet  Commonly known as:  NITROSTAT  Place 1 tablet (0.4 mg total) under the tongue every 5 (five) minutes as needed.     omeprazole 20 MG capsule  Commonly known as:  PRILOSEC  Take 1 capsule (20 mg total) by mouth 2 (two) times daily between meals as needed.     omeprazole 20 MG capsule  Commonly known as:  PRILOSEC  TAKE 1 CAPSULE (20 MG TOTAL) BY MOUTH 2 (TWO) TIMES DAILY BEFORE A MEAL.     pioglitazone 15 MG tablet  Commonly known as:  ACTOS  Take 1 tablet (15 mg total) by mouth daily.     pioglitazone 15 MG tablet  Commonly known as:  ACTOS  TAKE 1 TABLET (15 MG TOTAL) BY MOUTH DAILY.     rosuvastatin 40 MG tablet  Commonly known as:  CRESTOR  Take 1 tablet (40 mg total) by mouth daily.     tretinoin 0.1 % cream  Commonly known as:  RETIN-A     VITAMIN D PO  Take 1,000 mg by mouth 2 (two) times daily.        Allergies:  Allergies  Allergen Reactions  . Septra [Bactrim] Rash  . Sulfa Antibiotics Rash    Past Medical History  Diagnosis Date  . Coronary artery disease     2009 LAD Promus stent  . Hyperlipidemia   . Osteopenia   . Arthritis   . Type 1 diabetes mellitus on insulin therapy   . Fibroid   . Atrial flutter     a. s/p TEE/DCCV  08/16/13 (normal LV function, no LAA thrombus).b. s/p ablation by Dr Lovena Le 09-23-2013  . Valvular heart disease     a. Mild MR/TR by TEE 08/2013.    Past Surgical History  Procedure Laterality Date  . Coronary angioplasty with stent placement    . Pelvic laparoscopy    . Myomectomy  1977  . Appendectomy    . Cataract extraction Bilateral   . Tee without cardioversion N/A 08/16/2013    Procedure: TRANSESOPHAGEAL ECHOCARDIOGRAM (TEE);  Surgeon: Thayer Headings, MD;  Location: [facility];  Service: Cardiovascular;  Laterality: N/A;  . Cardioversion N/A 08/16/2013    Procedure: CARDIOVERSION;  Surgeon: Thayer Headings, MD;  Location: [facility];  Service: Cardiovascular;  Laterality: N/A;  spoke with [name]  . Ablation  09-23-2013    CTI by Dr Lovena Le  . Colonoscopy    . Atrial flutter ablation N/A 09/23/2013    Procedure: ATRIAL FLUTTER ABLATION;  Surgeon: Evans Lance, MD;  Location: [facility] LAB;  Service: Cardiovascular;  Laterality: N/A;    Family History  Problem Relation Age of Onset  . Atrial fibrillation Mother   . Diabetes Mother   . Hypertension Mother   . CAD Father 60    died age 35  . CAD Brother 1    small vessel disease  . CAD Cousin     paternal  . CAD Paternal Grandfather     early onset  . Colon cancer Neg Hx     Social History:  reports that she has never smoked. She has never used smokeless tobacco. She reports that she drinks about 1.2 oz of alcohol per week. She reports that she does not use illicit drugs.  REVIEW of systems:  She has a history of coronary disease   She has had diabetic dyslipidemia with significantly high LDL particle number and this has been managed with Crestor and Niaspan  In 3/16 her particle number was below 1000   Lab Results  Component Value Date   CHOL 147 10/13/2014   HDL 55.50 10/13/2014   LDLCALC 53 10/13/2014   LDLDIRECT 79.0 01/11/2015   TRIG 192.0* 10/13/2014   CHOLHDL 3 10/13/2014   No significant  hypertension and has had atrial arrhythmias treated with metoprolol She had atrial fibrillation ablation performed with success    BP 128/78 mmHg  Pulse 68  Temp(Src) 98.2 F (36.8 C)  Resp 16  Ht 5' 10.5" (1.791 m)  Wt 205 lb 12.8 oz (93.35 kg)  BMI 29.10 kg/m2  SpO2 97%   ASSESSMENT/PLAN:  DIABETES type 1 with fair control, A1c is  good at 7.0 given the difficulty  of her diabetes control; also has not had excessive hypoglycemia recently  See history of present illness for detailed discussion of her current blood sugar patterns and management with her pump and interpretation of her continuous glucose sensor Hypoglycemia: She has not had much of this lately with only occasional transient low normal sugars in the afternoons and early evening  HYPERGLYCEMIA: This is sometimes related to higher fat meals both at lunch and supper and she has not been doing enough dual wave boluses and this was discussed Some of her high readings after dinner may be continued overnight also She has postprandial hyperglycemia mostly after supper despite a relatively high carbohydrate coverage ratio  Overnight periods are also showing some hyperglycemia but not consistently She does need to have better blood sugar control fasting as the readings are mostly high lately Also discussed need to start exercising in whatever she she can start without traumatizing her ankle  For now she has been able to adjust to the continuous glucose monitoring and trying to respond to it better although not still consistently especially at night  Hyperlipidemia:  Needs  follow-up  Counseling time over 50% of today's 25 minute visit on diabetes management as above  [facility]

## 2015-06-28 ENCOUNTER — Telehealth: Payer: Self-pay | Admitting: Endocrinology

## 2015-06-28 NOTE — Telephone Encounter (Signed)
[name],  Can you please call her?  Thanks

## 2015-06-28 NOTE — Telephone Encounter (Signed)
There were 3 adjustments made at last visit with her insulin pump and it is not working, the CGM keeps going off she has had a lot of lows

## 2015-07-11 ENCOUNTER — Telehealth: Payer: Self-pay | Admitting: Nutrition

## 2015-07-11 NOTE — Telephone Encounter (Signed)
Patient had called last week with low blood sugars.  She reported that she decreased her basal rate "on my own",  by 0.05u/hr and having very few low blood sugars now.  Denies any unexplained high blood sugars also.   She is out of town, and did not have time to talk, so suggested she come see me to download cgm and pump to review readings.  She agreed to do this next week.

## 2015-07-31 ENCOUNTER — Other Ambulatory Visit: Payer: Self-pay | Admitting: *Deleted

## 2015-07-31 MED ORDER — NIACIN ER (ANTIHYPERLIPIDEMIC) 1000 MG PO TBCR: EXTENDED_RELEASE_TABLET | ORAL | Status: DC

## 2015-07-31 MED ORDER — PIOGLITAZONE HCL 15 MG PO TABS: ORAL_TABLET | ORAL | Status: DC

## 2015-07-31 MED ORDER — METFORMIN HCL ER 500 MG PO TB24: ORAL_TABLET | ORAL | Status: DC

## 2015-07-31 MED ORDER — ROSUVASTATIN CALCIUM 40 MG PO TABS: 40.0000 mg | ORAL_TABLET | Freq: Every day | ORAL | Status: DC

## 2015-07-31 MED ORDER — CLOPIDOGREL BISULFATE 75 MG PO TABS
ORAL_TABLET | ORAL | Status: DC
Start: 2015-07-31 — End: 2015-12-22

## 2015-08-01 ENCOUNTER — Encounter: Payer: Self-pay | Admitting: Family Medicine

## 2015-08-16 DIAGNOSIS — Z23 Encounter for immunization: Secondary | ICD-10-CM | POA: Diagnosis not present

## 2015-09-13 ENCOUNTER — Other Ambulatory Visit: Payer: Medicare Other

## 2015-09-13 ENCOUNTER — Ambulatory Visit: Payer: Medicare Other | Admitting: Cardiovascular Disease

## 2015-09-14 ENCOUNTER — Ambulatory Visit (INDEPENDENT_AMBULATORY_CARE_PROVIDER_SITE_OTHER): Payer: Medicare Other | Admitting: Cardiovascular Disease

## 2015-09-14 ENCOUNTER — Encounter: Payer: Self-pay | Admitting: Cardiovascular Disease

## 2015-09-14 ENCOUNTER — Other Ambulatory Visit (INDEPENDENT_AMBULATORY_CARE_PROVIDER_SITE_OTHER): Payer: Medicare Other | Admitting: *Deleted

## 2015-09-14 VITALS — BP 138/72 | HR 72 | Ht 69.5 in | Wt 208.5 lb

## 2015-09-14 DIAGNOSIS — I25119 Atherosclerotic heart disease of native coronary artery with unspecified angina pectoris: Secondary | ICD-10-CM

## 2015-09-14 DIAGNOSIS — I2510 Atherosclerotic heart disease of native coronary artery without angina pectoris: Secondary | ICD-10-CM | POA: Diagnosis not present

## 2015-09-14 DIAGNOSIS — E785 Hyperlipidemia, unspecified: Secondary | ICD-10-CM

## 2015-09-14 LAB — LIPID PANEL
CHOL/HDL RATIO: 2.4 ratio (ref <=5.0)
Cholesterol: 124 mg/dL — ABNORMAL LOW (ref 125–200)
HDL: 51 mg/dL (ref >=46)
LDL Cholesterol: 60 mg/dL (ref <130)
TRIGLYCERIDES: 63 mg/dL (ref <150)
VLDL: 13 mg/dL (ref <30)

## 2015-09-14 LAB — BASIC METABOLIC PANEL
BUN: 15 mg/dL (ref 7–25)
CHLORIDE: 106 mmol/L (ref 98–110)
CO2: 26 mmol/L (ref 20–31)
CREATININE: 0.65 mg/dL (ref 0.50–0.99)
Calcium: 9 mg/dL (ref 8.6–10.4)
Glucose, Bld: 257 mg/dL — ABNORMAL HIGH (ref 65–99)
POTASSIUM: 4.4 mmol/L (ref 3.5–5.3)
Sodium: 138 mmol/L (ref 135–146)

## 2015-09-14 LAB — HEPATIC FUNCTION PANEL
ALK PHOS: 87 U/L (ref 33–130)
ALT: 24 U/L (ref 6–29)
AST: 23 U/L (ref 10–35)
Albumin: 3.8 g/dL (ref 3.6–5.1)
BILIRUBIN DIRECT: 0.1 mg/dL (ref <=0.2)
BILIRUBIN INDIRECT: 0.3 mg/dL (ref 0.2–1.2)
TOTAL PROTEIN: 6.8 g/dL (ref 6.1–8.1)
Total Bilirubin: 0.4 mg/dL (ref 0.2–1.2)

## 2015-09-14 MED ORDER — NITROGLYCERIN 0.4 MG SL SUBL: 0.4000 mg | SUBLINGUAL_TABLET | SUBLINGUAL | Status: DC | PRN

## 2015-09-14 MED ORDER — ASPIRIN EC 81 MG PO TBEC: 81.0000 mg | DELAYED_RELEASE_TABLET | Freq: Every day | ORAL | Status: DC

## 2015-09-14 NOTE — Progress Notes (Signed)
[name] Date of Birth  1945-11-29 [place] [address]    [place], [place]  [address]    [place], [place]  [address] [phone]  Fax  [phone]  [phone]  Fax [phone]   Problems. 1. CAD 2. Atrial Flutter - s/p Aflutter ablation Nov. 2014.  3. Diabetes Mellitus.   History of Present Illness:  [name] is doing very well. She's not having episodes of chest pain or shortness of breath. She's been able to below of her normal activities without any significant problems. She has not had chest pains .  Her insurance will run out the month before she goes on Medicare.   She has not had any CP and no palpitations.    April 08, 2013:   [name] is doing well. No CP.  Rhythm is stable.     Mar 11, 2014:  [name] is doing well.  She had a atrial flutter ablation several months ago.  She has not been taking her metoprolol..  Nov. 9, 2015:  [name] is doing well. No Cp , no dyspnea.  Active, not exerciseing as much as she would like.  Mar 16, 2015:   [name] is doing well.   Doing well from a cardiac standpoint.   Nov. 10, 2016  Lots of stress - her 28 yo mom is living with her.  Will not let anyone else do anything Lots of stress No CP , no arrhythmias No time to exercise   Current Outpatient Prescriptions on File Prior to Visit  Medication Sig Dispense Refill  . Calcium Carbonate-Vitamin D (CALCIUM + D PO) Take 600 mg by mouth 2 (two) times daily.     . Cholecalciferol (VITAMIN D PO) Take 1,000 mg by mouth 2 (two) times daily.     . clopidogrel (PLAVIX) 75 MG tablet Take 1 tablet by mouth  daily 90 tablet 1  . glucose blood (ONE TOUCH ULTRA TEST) test strip Use as instructed to check blood sugar 6 times per day dx code E10.65 200 each 5  . HUMALOG 100 UNIT/ML injection USE MAX 200 UNITS PER DAY WITH INSULIN PUMP 90 mL 1  . ibuprofen (ADVIL,MOTRIN) 200 MG tablet Take 200 mg by mouth every 6 (six) hours  as needed.    . Insulin Aspart (INSULIN PUMP) 100 unit/ml SOLN Inject into the skin. Novolog Vial, max 200 units per day with pump    . metFORMIN (GLUCOPHAGE-XR) 500 MG 24 hr tablet Take 2 tablets by mouth  daily 180 tablet 1  . metoprolol succinate (TOPROL-XL) 100 MG 24 hr tablet TAKE 1 TABLET (100 MG TOTAL) BY MOUTH DAILY. 90 tablet 3  . metroNIDAZOLE (METROGEL) 0.75 % gel   11  . niacin (NIASPAN) 1000 MG CR tablet Take 1 tablet by mouth at  bedtime 90 tablet 1  . omeprazole (PRILOSEC) 20 MG capsule Take 1 capsule (20 mg total) by mouth 2 (two) times daily between meals as needed. 60 capsule 1  . pioglitazone (ACTOS) 15 MG tablet Take 1 tablet (15 mg total) by mouth daily. 90 tablet 1  . rosuvastatin (CRESTOR) 40 MG tablet Take 1 tablet (40 mg total) by mouth daily. 90 tablet 1  . tretinoin (RETIN-A) 0.1 % cream   11  . alendronate (FOSAMAX) 70 MG tablet Take 1 tablet (70 mg total) by mouth every 7 (seven) days. Take with a full glass of water on  an empty stomach. (Patient not taking: Reported on 09/14/2015) 12 tablet 0  . doxycycline (VIBRAMYCIN) 100 MG capsule Take 100 mg by mouth daily.   11   No current facility-administered medications on file prior to visit.    Allergies  Allergen Reactions  . Septra [Bactrim] Rash  . Sulfa Antibiotics Rash    Past Medical History  Diagnosis Date  . Coronary artery disease     2009 LAD Promus stent  . Hyperlipidemia   . Osteopenia   . Arthritis   . Type 1 diabetes mellitus on insulin therapy   . Fibroid   . Atrial flutter     a. s/p TEE/DCCV 08/16/13 (normal LV function, no LAA thrombus).b. s/p ablation by Dr Lovena Le 09-23-2013  . Valvular heart disease     a. Mild MR/TR by TEE 08/2013.    Past Surgical History  Procedure Laterality Date  . Coronary angioplasty with stent placement    . Pelvic laparoscopy    . Myomectomy  1977  . Appendectomy    . Cataract extraction Bilateral   . Tee without cardioversion N/A 08/16/2013     Procedure: TRANSESOPHAGEAL ECHOCARDIOGRAM (TEE);  Surgeon: Thayer Headings, MD;  Location: [facility];  Service: Cardiovascular;  Laterality: N/A;  . Cardioversion N/A 08/16/2013    Procedure: CARDIOVERSION;  Surgeon: Thayer Headings, MD;  Location: [facility];  Service: Cardiovascular;  Laterality: N/A;  spoke with [name]  . Ablation  09-23-2013    CTI by Dr Lovena Le  . Colonoscopy    . Atrial flutter ablation N/A 09/23/2013    Procedure: ATRIAL FLUTTER ABLATION;  Surgeon: Evans Lance, MD;  Location: [facility] CATH LAB;  Service: Cardiovascular;  Laterality: N/A;    History  Smoking status  . Never Smoker   Smokeless tobacco  . Never Used    History  Alcohol Use  . 1.2 oz/week  . 2 Glasses of wine per week    Family History  Problem Relation Age of Onset  . Atrial fibrillation Mother   . Diabetes Mother   . Hypertension Mother   . CAD Father 40    died age 26  . CAD Brother 53    small vessel disease  . CAD Cousin     paternal  . CAD Paternal Grandfather     early onset  . Colon cancer Neg Hx     Reviw of Systems:  Reviewed in the HPI.  All other systems are negative.  Physical Exam: Blood pressure 138/72, pulse 72, height 5' 9.5" (1.765 m), weight 208 lb 8 oz (94.575 kg), SpO2 96 %. General: Well developed, well nourished, in no acute distress.  Head: Normocephalic, atraumatic, sclera non-icteric, mucus membranes are moist,   Neck: Supple. Negative for carotid bruits. JVD not elevated.  Lungs: Clear bilaterally to auscultation without wheezes, rales, or rhonchi. Breathing is unlabored.  Heart: RRR with S1 S2. No murmurs, rubs, or gallops appreciated.  Abdomen: Soft, non-tender, non-distended with normoactive bowel sounds. No hepatomegaly. No rebound/guarding. No obvious abdominal masses.  Msk:  Strength and tone appear normal for age.  Extremities: No clubbing or cyanosis. No edema.  Distal pedal pulses are 2+ and equal bilaterally.  Neuro: Alert and oriented  X 3. Moves all extremities spontaneously.  Psych:  Responds to questions appropriately with a normal affect.  ECG: NSR at 67.  NS ST abn.   Assessment / Plan:   1. CAD - doing well.  No angina . Continue current meds  2. Atrial Flutter - s/p Aflutter ablation Nov. 2014.  Will reduce ASA to 81.  No evidence of recurrence.    3. Diabetes Mellitus.  4. HYperlipidemia:  wil check fasting labs in 6 months at her next office visit.  Continue crestor.     [name], Wonda Cheng, MD  09/14/2015 6:43 PM    [place],  [place], [place]  [address] Pager [phone] Phone: [phone]; Fax: [phone]   [facility]  [address] [place], [place]  [address] [phone]    Fax [phone]

## 2015-09-14 NOTE — Addendum Note (Signed)
Addended by: Eulis Foster on: 09/14/2015 08:05 AM   Modules accepted: Orders

## 2015-09-14 NOTE — Addendum Note (Signed)
Addended by: Eulis Foster on: 09/14/2015 08:04 AM   Modules accepted: Orders

## 2015-09-14 NOTE — Patient Instructions (Signed)
Medication Instructions:  DECREASE Aspirin to 81 mg once daily   Labwork: Done Today - cholesterol, liver, basic metabolic panel   Testing/Procedures: None Ordered   Follow-Up: Your physician wants you to follow-up in: 6 months with Dr. Acie Fredrickson.  You will receive a reminder letter in the mail two months in advance. If you don't receive a letter, please call our office to schedule the follow-up appointment.   If you need a refill on your cardiac medications before your next appointment, please call your pharmacy.   Thank you for choosing [facility]! [name], RN [phone]

## 2015-09-18 ENCOUNTER — Other Ambulatory Visit (INDEPENDENT_AMBULATORY_CARE_PROVIDER_SITE_OTHER): Payer: Medicare Other

## 2015-09-18 DIAGNOSIS — E1065 Type 1 diabetes mellitus with hyperglycemia: Secondary | ICD-10-CM | POA: Diagnosis not present

## 2015-09-18 DIAGNOSIS — IMO0002 Reserved for concepts with insufficient information to code with codable children: Secondary | ICD-10-CM

## 2015-09-18 LAB — BASIC METABOLIC PANEL
BUN: 12 mg/dL (ref 6–23)
CALCIUM: 9.3 mg/dL (ref 8.4–10.5)
CO2: 29 mEq/L (ref 19–32)
CREATININE: 0.73 mg/dL (ref 0.40–1.20)
Chloride: 104 mEq/L (ref 96–112)
GFR: 84.32 mL/min (ref >60.00)
Glucose, Bld: 76 mg/dL (ref 70–99)
Potassium: 4 mEq/L (ref 3.5–5.1)
Sodium: 140 mEq/L (ref 135–145)

## 2015-09-18 LAB — HEMOGLOBIN A1C: HEMOGLOBIN A1C: 6.7 % — AB (ref 4.6–6.5)

## 2015-09-18 LAB — LIPID PANEL
CHOLESTEROL: 128 mg/dL (ref 0–200)
HDL: 55.3 mg/dL (ref >39.00)
LDL Cholesterol: 60 mg/dL (ref 0–99)
NonHDL: 72.33
TRIGLYCERIDES: 63 mg/dL (ref 0.0–149.0)
Total CHOL/HDL Ratio: 2
VLDL: 12.6 mg/dL (ref 0.0–40.0)

## 2015-09-21 ENCOUNTER — Ambulatory Visit: Payer: Medicare Other | Admitting: Endocrinology

## 2015-09-22 ENCOUNTER — Ambulatory Visit (INDEPENDENT_AMBULATORY_CARE_PROVIDER_SITE_OTHER): Payer: Medicare Other | Admitting: Endocrinology

## 2015-09-22 VITALS — BP 130/82 | HR 74 | Temp 98.3°F | Resp 14 | Ht 69.5 in | Wt 207.8 lb

## 2015-09-22 DIAGNOSIS — E782 Mixed hyperlipidemia: Secondary | ICD-10-CM | POA: Diagnosis not present

## 2015-09-22 DIAGNOSIS — I25119 Atherosclerotic heart disease of native coronary artery with unspecified angina pectoris: Secondary | ICD-10-CM

## 2015-09-22 DIAGNOSIS — E1065 Type 1 diabetes mellitus with hyperglycemia: Secondary | ICD-10-CM

## 2015-09-22 NOTE — Progress Notes (Signed)
Patient ID: [name], female   DOB: [birth year], 68 y.o.   MRN: [id number]   Reason for visit: DIABETES followup. Diagnosis: Type 1 diabetes mellitus, date of diagnosis: 1979.    HISTORY of present illness:  Insulin Pump: CURRENT brand: One Touch Ping The pump settings are: Midnight = 1.2, 2 AM = 0.9, 5 AM = 1.8, 8 AM = 1.85, 12 noon = 1.2 and 5 PM = 2.4, 7 PM = 2.7 and 10 PM = 2.2.  Total daily basal = 41 unit   Carbohydrate ratio 1:9 lunch and 1:5 at dinner; sensitivity 1: 35, 30 after 5 p.m.; target 120 +/-10  Insulin on board indicator on, duration 4 hrs   DIABETES: She has had long-standing diabetes with A1c levels usually above target. In 10/13 ACTOS was restarted because of her large insulin requirement and diabetic dyslipidemia.  Her blood sugars improved significantly and basal rates were reduced.   RECENT history:  Her A1c is improved consecutively and now 6.7 However she still has tendency to hypoglycemia, now mostly overnight  Current blood sugar patterns and problems identified:  Have reviewed the data from her continuous glucose monitoring available and details are discussed below  OVERNIGHT glucose is now trending below most of the time recently with significant hypoglycemia especially around 2-3 AM  Blood sugar may be mostly high at breakfast and variably high at lunch and dinner.  She has no consistent pattern on several of her recordings otherwise  She has difficulty controlling postprandial readings consistently especially with higher fat meals including on 11/16 when she did not cover the high fat content with enough insulin and blood sugar was up to 300 postprandially for a few hours  Overall blood sugars are mostly high after about 2 PM until 11 PM  Glucose fasting blood sugars are not consistent and sometimes significantly high based on the events the night before  She is finding the continuous glucose monitoring helpful in adjusting her  insulin but still not able to get controlled today  AVERAGE blood sugar with fingersticks is only 151 and similar on her continuous glucose monitoring  She is not comfortable reducing her basal rate overnight when she has consistent hypoglycemia and does not suspend her pump.  She still has not started any exercise regimen  COMPLIANCE with boluses has been fairly good although difficult to assess because she often does not enter her carbohydrates at the time of her meals or snacks  Monitors blood glucose: on an average about 5 times a day according to pump download.  Blood Glucose readings on her monitor  Overall average blood sugar was 151 +/-67 Highest overall average of 179 at 4-6 PM Lowest overall average 67 overnight AVERAGE blood sugars during the day range from 118 up to 179, mostly higher after 2 PM Also has relatively high average readings after 8 PM of 199 and 231  She has 58% within desired range, 32 % above and 10 % below the desired range on her download  CONTINUOUS GLUCOSE MONITORING RECORD INTERPRETATION    Dates of Recording: 11/12 through 09/22/15  Sensor  summary:  Average blood glucose for the period of recording is 149+/-61 61% of readings are in the high range, 23 the target and 16%  low        Glycemic patterns:  Hyperglycemic episodes are found significantly in the afternoons around 4 PM and also on a separate day around 6 AM.  Hypoglycemic episodes occurred most significantly overnight  and transiently 11/14  Around noon and 10 PM as well as on 11/13 at  2:30 PM  Blood sugars show significant variability at all times with readings close to target only around 1 PM      Overnight periods:   blood sugar on an average was trending lower starting around midnight with the lowest reading around 2:30 AM with significant hyperglycemia on 4 of the nights; on tonight's the blood sugar was persistently high between 150-220 and rising significantly before dawn on 11/13        Preprandial periods:   blood sugars are loose to target only around lunchtime but are variable and generally higher at other times      Postprandial periods:   blood sugars tend to be applied after breakfast and variably high after lunch. Blood sugars also in the evenings are variably high on at least 2 of the evenings and dropping low on 11/14 postprandially      Hypoglycemia: significantly overnight and transiently 11/14  Around noon and 10 PM as well as on 11/13 at  2:30 PM       Hypoglycemic awareness: Has symptoms of feeling tired, headache, sweaty. Variable recognition threshold present  Sometimes may not be aware during the night and will be notified by her sensor that the sugar is low  She has symptoms when blood glucose is less than 60. Uses glucose tablets for treatment when not at home but otherwise eats a snack .    DIET has been relatively better recently overall and her mealtimes are variable. Frequently will not eat breakfast.  Usually trying to have low fat meals  Food preferences: Yogurt And cottage cheese for breakfast usually.  Physical activity: exercise: none Historically after exercise blood sugar will get lower in 1-2 hrs. .  Certified Diabetes Educator visit: Most recent: 8/12.   She is usually regular with her eye exams  Wt Readings from Last 3 Encounters:  09/22/15 207 lb 12.8 oz (94.257 kg)  09/14/15 208 lb 8 oz (94.575 kg)  06/22/15 205 lb 12.8 oz (93.35 kg)    LABS:  Lab Results  Component Value Date   HGBA1C 6.7* 09/18/2015   HGBA1C 7.0* 06/19/2015   HGBA1C 7.4* 01/11/2015   Lab Results  Component Value Date   MICROALBUR <0.7 06/19/2015   LDLCALC 60 09/18/2015   CREATININE 0.73 09/18/2015    Appointment on 09/18/2015  Component Date Value Ref Range Status  . Hgb A1c MFr Bld 09/18/2015 6.7* 4.6 - 6.5 % Final   Glycemic Control Guidelines for People with Diabetes:Non Diabetic:  <6%Goal of Therapy: <7%Additional Action Suggested:  >8%   .  Sodium 09/18/2015 140  135 - 145 mEq/L Final  . Potassium 09/18/2015 4.0  3.5 - 5.1 mEq/L Final  . Chloride 09/18/2015 104  96 - 112 mEq/L Final  . CO2 09/18/2015 29  19 - 32 mEq/L Final  . Glucose, Bld 09/18/2015 76  70 - 99 mg/dL Final  . BUN 09/18/2015 12  6 - 23 mg/dL Final  . Creatinine, Ser 09/18/2015 0.73  0.40 - 1.20 mg/dL Final  . Calcium 09/18/2015 9.3  8.4 - 10.5 mg/dL Final  . GFR 09/18/2015 84.32  >60.00 mL/min Final  . Cholesterol 09/18/2015 128  0 - 200 mg/dL Final   ATP III Classification       Desirable:  < 200 mg/dL               Borderline High:  200 - 239 mg/dL  High:  > = 240 mg/dL  . Triglycerides 09/18/2015 63.0  0.0 - 149.0 mg/dL Final   Normal:  <150 mg/dLBorderline High:  150 - 199 mg/dL  . HDL 09/18/2015 55.30  >39.00 mg/dL Final  . VLDL 09/18/2015 12.6  0.0 - 40.0 mg/dL Final  . LDL Cholesterol 09/18/2015 60  0 - 99 mg/dL Final  . Total CHOL/HDL Ratio 09/18/2015 2   Final                  Men          Women1/2 Average Risk     3.4          3.3Average Risk          5.0          4.42X Average Risk          9.6          7.13X Average Risk          15.0          11.0                      . NonHDL 09/18/2015 72.33   Final   NOTE:  Non-HDL goal should be 30 mg/dL higher than patient's LDL goal (i.e. LDL goal of < 70 mg/dL, would have non-HDL goal of < 100 mg/dL)      Medication List       This list is accurate as of: 09/22/15 11:59 PM.  Always use your most recent med list.               alendronate 70 MG tablet  Commonly known as:  FOSAMAX  Take 1 tablet (70 mg total) by mouth every 7 (seven) days. Take with a full glass of water on an empty stomach.     aspirin EC 81 MG tablet  Take 1 tablet (81 mg total) by mouth daily.     CALCIUM + D PO  Take 600 mg by mouth 2 (two) times daily.     clopidogrel 75 MG tablet  Commonly known as:  PLAVIX  Take 1 tablet by mouth  daily     doxycycline 100 MG capsule  Commonly known as:  VIBRAMYCIN  Take  100 mg by mouth daily.     FLUZONE HIGH-DOSE 0.5 ML Susy  Generic drug:  Influenza Vac Split High-Dose  ADM 0.5ML IM UTD     glucose blood test strip  Commonly known as:  ONE TOUCH ULTRA TEST  Use as instructed to check blood sugar 6 times per day dx code E10.65     HUMALOG 100 UNIT/ML injection  Generic drug:  insulin lispro  USE MAX 200 UNITS PER DAY WITH INSULIN PUMP     ibuprofen 200 MG tablet  Commonly known as:  ADVIL,MOTRIN  Take 200 mg by mouth every 6 (six) hours as needed.     insulin pump Soln  Inject into the skin. Novolog Vial, max 200 units per day with pump     metFORMIN 500 MG 24 hr tablet  Commonly known as:  GLUCOPHAGE-XR  Take 2 tablets by mouth  daily     metoprolol succinate 100 MG 24 hr tablet  Commonly known as:  TOPROL-XL  TAKE 1 TABLET (100 MG TOTAL) BY MOUTH DAILY.     metroNIDAZOLE 0.75 % gel  Commonly known as:  METROGEL     niacin 1000 MG CR tablet  Commonly known  as:  NIASPAN  Take 1 tablet by mouth at  bedtime     nitroGLYCERIN 0.4 MG SL tablet  Commonly known as:  NITROSTAT  Place 1 tablet (0.4 mg total) under the tongue every 5 (five) minutes as needed for chest pain.     omeprazole 20 MG capsule  Commonly known as:  PRILOSEC  Take 1 capsule (20 mg total) by mouth 2 (two) times daily between meals as needed.     pioglitazone 15 MG tablet  Commonly known as:  ACTOS  Take 1 tablet (15 mg total) by mouth daily.     rosuvastatin 40 MG tablet  Commonly known as:  CRESTOR  Take 1 tablet (40 mg total) by mouth daily.     tretinoin 0.1 % cream  Commonly known as:  RETIN-A     VITAMIN D PO  Take 1,000 mg by mouth 2 (two) times daily.        Allergies:  Allergies  Allergen Reactions  . Septra [Bactrim] Rash  . Sulfa Antibiotics Rash    Past Medical History  Diagnosis Date  . Coronary artery disease     2009 LAD Promus stent  . Hyperlipidemia   . Osteopenia   . Arthritis   . Type 1 diabetes mellitus on insulin therapy  ([place])   . Fibroid   . Atrial flutter ([place])     a. s/p TEE/DCCV 08/16/13 (normal LV function, no LAA thrombus).b. s/p ablation by Dr Lovena Le 09-23-2013  . Valvular heart disease     a. Mild MR/TR by TEE 08/2013.    Past Surgical History  Procedure Laterality Date  . Coronary angioplasty with stent placement    . Pelvic laparoscopy    . Myomectomy  1977  . Appendectomy    . Cataract extraction Bilateral   . Tee without cardioversion N/A 08/16/2013    Procedure: TRANSESOPHAGEAL ECHOCARDIOGRAM (TEE);  Surgeon: Thayer Headings, MD;  Location: [facility];  Service: Cardiovascular;  Laterality: N/A;  . Cardioversion N/A 08/16/2013    Procedure: CARDIOVERSION;  Surgeon: Thayer Headings, MD;  Location: [facility];  Service: Cardiovascular;  Laterality: N/A;  spoke with [name]  . Ablation  09-23-2013    CTI by Dr Lovena Le  . Colonoscopy    . Atrial flutter ablation N/A 09/23/2013    Procedure: ATRIAL FLUTTER ABLATION;  Surgeon: Evans Lance, MD;  Location: [facility] CATH LAB;  Service: Cardiovascular;  Laterality: N/A;    Family History  Problem Relation Age of Onset  . Atrial fibrillation Mother   . Diabetes Mother   . Hypertension Mother   . CAD Father 65    died age 82  . CAD Brother 66    small vessel disease  . CAD Cousin     paternal  . CAD Paternal Grandfather     early onset  . Colon cancer Neg Hx     Social History:  reports that she has never smoked. She has never used smokeless tobacco. She reports that she drinks about 1.2 oz of alcohol per week. She reports that she does not use illicit drugs.  REVIEW of systems:  She has a history of coronary disease   She has had diabetic dyslipidemia with significantly high LDL particle number and this has been managed with Crestor and Niaspan  In 3/16 her particle number was below 1000 LDL is below 70   Lab Results  Component Value Date   CHOL 128 09/18/2015   HDL 55.30 09/18/2015  Angelina 60 09/18/2015   LDLDIRECT 79.0  01/11/2015   TRIG 63.0 09/18/2015   CHOLHDL 2 09/18/2015   No significant hypertension and has had atrial arrhythmias treated with metoprolol    BP 130/82 mmHg  Pulse 74  Temp(Src) 98.3 F (36.8 C)  Resp 14  Ht 5' 9.5" (1.765 m)  Wt 207 lb 12.8 oz (94.257 kg)  BMI 30.26 kg/m2  SpO2 95%   ASSESSMENT/PLAN:  DIABETES type 1 with fair control, A1c is excellent at 6.7 but she probably has some influence of hypoglycemia on day average  See history of present illness for detailed discussion of her current blood sugar patterns and management with her pump and interpretation of her continuous glucose sensor More recently has been using her continuous glucose monitor to help control her glucose excursions but not always taking action right away  Hypoglycemia: She has now started having this overnight more consistently especially in last few days and has not made any changes on her own in response to this  HYPERGLYCEMIA: This is occurring at variable times but mostly in the afternoons and evenings Some of her high sugars are related to higher fat meals especially when eating out at lunch which she is still not covering with extra insulin are extended boluses even though she has tried these Also not clear why she has some persistently high readings on 2 of her nights recently Generally avoiding high sugars in the evenings but does tend to rebound from hypoglycemia  Changes and settings as follows: REDUCE midnight basal rate to 1.0 until 2 AM and may consider reducing 2 AM basal rate if needed Increase basal rate at 7 PM to 2.8 Continue same carbohydrate coverage Better coverage of meals when eating out and higher fat meals   Hyperlipidemia: Well controlled with LDL and HDL at target Needs  to continue same regimen  Counseling time over 50% of today's 25 minute visit on diabetes management as above  [facility]

## 2015-10-17 ENCOUNTER — Telehealth: Payer: Self-pay | Admitting: Endocrinology

## 2015-10-17 NOTE — Telephone Encounter (Signed)
I haven't received any documentation.

## 2015-10-17 NOTE — Telephone Encounter (Signed)
Physician documentation sent to [employer] to release the order for her supplies for CGM [phone]

## 2015-10-30 ENCOUNTER — Other Ambulatory Visit: Payer: Self-pay | Admitting: Endocrinology

## 2015-10-31 ENCOUNTER — Other Ambulatory Visit: Payer: Self-pay | Admitting: *Deleted

## 2015-10-31 MED ORDER — ROSUVASTATIN CALCIUM 40 MG PO TABS: 40.0000 mg | ORAL_TABLET | Freq: Every day | ORAL | Status: DC

## 2015-11-03 ENCOUNTER — Telehealth: Payer: Self-pay | Admitting: Endocrinology

## 2015-11-03 ENCOUNTER — Other Ambulatory Visit: Payer: Self-pay | Admitting: *Deleted

## 2015-11-03 MED ORDER — GLUCOSE BLOOD VI STRP: ORAL_STRIP | Status: DC

## 2015-11-03 NOTE — Telephone Encounter (Signed)
Patient need refill of one touch ultra blue, test strips send to  [id number] - [place], [place] - [address] [id number] (Phone) [phone] (Fax)

## 2015-11-03 NOTE — Telephone Encounter (Signed)
rx sent

## 2015-11-08 ENCOUNTER — Other Ambulatory Visit: Payer: Medicare Other

## 2015-11-08 ENCOUNTER — Ambulatory Visit (INDEPENDENT_AMBULATORY_CARE_PROVIDER_SITE_OTHER): Payer: Medicare Other | Admitting: Internal Medicine

## 2015-11-08 ENCOUNTER — Encounter: Payer: Medicare Other | Admitting: Internal Medicine

## 2015-11-08 ENCOUNTER — Encounter: Payer: Self-pay | Admitting: Internal Medicine

## 2015-11-08 VITALS — BP 148/78 | HR 73 | Temp 97.6°F | Resp 12 | Ht 69.5 in | Wt 207.0 lb

## 2015-11-08 DIAGNOSIS — Z0000 Encounter for general adult medical examination without abnormal findings: Secondary | ICD-10-CM | POA: Diagnosis not present

## 2015-11-08 NOTE — Assessment & Plan Note (Signed)
BP at goal, diabetes doing well. Getting mammogram soon. Immunizations up to date. Checking hepatitis C screening today. Reviewed recent labs and no indication for change. Counseled on sunscreen for skin cancer prevention. Given 10 year screening recommendations. Colonoscopy up to date.

## 2015-11-08 NOTE — Patient Instructions (Signed)
We will check the hepatitis C screening test today and call you back with the results.   Come back in July or early August before your trip for your shot and travel advice.   Health Maintenance, Female Adopting a healthy lifestyle and getting preventive care can go a long way to promote health and wellness. Talk with your health care provider about what schedule of regular examinations is right for you. This is a good chance for you to check in with your provider about disease prevention and staying healthy. In between checkups, there are plenty of things you can do on your own. Experts have done a lot of research about which lifestyle changes and preventive measures are most likely to keep you healthy. Ask your health care provider for more information. WEIGHT AND DIET  Eat a healthy diet  Be sure to include plenty of vegetables, fruits, low-fat dairy products, and lean protein.  Do not eat a lot of foods high in solid fats, added sugars, or salt.  Get regular exercise. This is one of the most important things you can do for your health.  Most adults should exercise for at least 150 minutes each week. The exercise should increase your heart rate and make you sweat (moderate-intensity exercise).  Most adults should also do strengthening exercises at least twice a week. This is in addition to the moderate-intensity exercise.  Maintain a healthy weight  Body mass index (BMI) is a measurement that can be used to identify possible weight problems. It estimates body fat based on height and weight. Your health care provider can help determine your BMI and help you achieve or maintain a healthy weight.  For females 66 years of age and older:   A BMI below 18.5 is considered underweight.  A BMI of 18.5 to 24.9 is normal.  A BMI of 25 to 29.9 is considered overweight.  A BMI of 30 and above is considered obese.  Watch levels of cholesterol and blood lipids  You should start having your  blood tested for lipids and cholesterol at 68 years of age, then have this test every 5 years.  You may need to have your cholesterol levels checked more often if:  Your lipid or cholesterol levels are high.  You are older than 67 years of age.  You are at high risk for heart disease.  CANCER SCREENING   Lung Cancer  Lung cancer screening is recommended for adults 45-56 years old who are at high risk for lung cancer because of a history of smoking.  A yearly low-dose CT scan of the lungs is recommended for people who:  Currently smoke.  Have quit within the past 15 years.  Have at least a 30-pack-year history of smoking. A pack year is smoking an average of one pack of cigarettes a day for 1 year.  Yearly screening should continue until it has been 15 years since you quit.  Yearly screening should stop if you develop a health problem that would prevent you from having lung cancer treatment.  Breast Cancer  Practice breast self-awareness. This means understanding how your breasts normally appear and feel.  It also means doing regular breast self-exams. Let your health care provider know about any changes, no matter how small.  If you are in your 20s or 30s, you should have a clinical breast exam (CBE) by a health care provider every 1-3 years as part of a regular health exam.  If you are 40 or older,  have a CBE every year. Also consider having a breast X-ray (mammogram) every year.  If you have a family history of breast cancer, talk to your health care provider about genetic screening.  If you are at high risk for breast cancer, talk to your health care provider about having an MRI and a mammogram every year.  Breast cancer gene (BRCA) assessment is recommended for women who have family members with BRCA-related cancers. BRCA-related cancers include:  Breast.  Ovarian.  Tubal.  Peritoneal cancers.  Results of the assessment will determine the need for genetic  counseling and BRCA1 and BRCA2 testing. Cervical Cancer Your health care provider may recommend that you be screened regularly for cancer of the pelvic organs (ovaries, uterus, and vagina). This screening involves a pelvic examination, including checking for microscopic changes to the surface of your cervix (Pap test). You may be encouraged to have this screening done every 3 years, beginning at age 37.  For women ages 81-65, health care providers may recommend pelvic exams and Pap testing every 3 years, or they may recommend the Pap and pelvic exam, combined with testing for human papilloma virus (HPV), every 5 years. Some types of HPV increase your risk of cervical cancer. Testing for HPV may also be done on women of any age with unclear Pap test results.  Other health care providers may not recommend any screening for nonpregnant women who are considered low risk for pelvic cancer and who do not have symptoms. Ask your health care provider if a screening pelvic exam is right for you.  If you have had past treatment for cervical cancer or a condition that could lead to cancer, you need Pap tests and screening for cancer for at least 20 years after your treatment. If Pap tests have been discontinued, your risk factors (such as having a new sexual partner) need to be reassessed to determine if screening should resume. Some women have medical problems that increase the chance of getting cervical cancer. In these cases, your health care provider may recommend more frequent screening and Pap tests. Colorectal Cancer  This type of cancer can be detected and often prevented.  Routine colorectal cancer screening usually begins at 69 years of age and continues through 68 years of age.  Your health care provider may recommend screening at an earlier age if you have risk factors for colon cancer.  Your health care provider may also recommend using home test kits to check for hidden blood in the stool.  A  small camera at the end of a tube can be used to examine your colon directly (sigmoidoscopy or colonoscopy). This is done to check for the earliest forms of colorectal cancer.  Routine screening usually begins at age 59.  Direct examination of the colon should be repeated every 5-10 years through 68 years of age. However, you may need to be screened more often if early forms of precancerous polyps or small growths are found. Skin Cancer  Check your skin from head to toe regularly.  Tell your health care provider about any new moles or changes in moles, especially if there is a change in a mole's shape or color.  Also tell your health care provider if you have a mole that is larger than the size of a pencil eraser.  Always use sunscreen. Apply sunscreen liberally and repeatedly throughout the day.  Protect yourself by wearing long sleeves, pants, a wide-brimmed hat, and sunglasses whenever you are outside. HEART DISEASE, DIABETES, AND  HIGH BLOOD PRESSURE   High blood pressure causes heart disease and increases the risk of stroke. High blood pressure is more likely to develop in:  People who have blood pressure in the high end of the normal range (130-139/85-89 mm Hg).  People who are overweight or obese.  People who are African American.  If you are 32-68 years of age, have your blood pressure checked every 3-5 years. If you are 41 years of age or older, have your blood pressure checked every year. You should have your blood pressure measured twice--once when you are at a hospital or clinic, and once when you are not at a hospital or clinic. Record the average of the two measurements. To check your blood pressure when you are not at a hospital or clinic, you can use:  An automated blood pressure machine at a pharmacy.  A home blood pressure monitor.  If you are between 56 years and 69 years old, ask your health care provider if you should take aspirin to prevent strokes.  Have  regular diabetes screenings. This involves taking a blood sample to check your fasting blood sugar level.  If you are at a normal weight and have a low risk for diabetes, have this test once every three years after 68 years of age.  If you are overweight and have a high risk for diabetes, consider being tested at a younger age or more often. PREVENTING INFECTION  Hepatitis B  If you have a higher risk for hepatitis B, you should be screened for this virus. You are considered at high risk for hepatitis B if:  You were born in a country where hepatitis B is common. Ask your health care provider which countries are considered high risk.  Your parents were born in a high-risk country, and you have not been immunized against hepatitis B (hepatitis B vaccine).  You have HIV or AIDS.  You use needles to inject street drugs.  You live with someone who has hepatitis B.  You have had sex with someone who has hepatitis B.  You get hemodialysis treatment.  You take certain medicines for conditions, including cancer, organ transplantation, and autoimmune conditions. Hepatitis C  Blood testing is recommended for:  Everyone born from [birth year] through 1963.  Anyone with known risk factors for hepatitis C. Sexually transmitted infections (STIs)  You should be screened for sexually transmitted infections (STIs) including gonorrhea and chlamydia if:  You are sexually active and are younger than 68 years of age.  You are older than 68 years of age and your health care provider tells you that you are at risk for this type of infection.  Your sexual activity has changed since you were last screened and you are at an increased risk for chlamydia or gonorrhea. Ask your health care provider if you are at risk.  If you do not have HIV, but are at risk, it may be recommended that you take a prescription medicine daily to prevent HIV infection. This is called pre-exposure prophylaxis (PrEP). You are  considered at risk if:  You are sexually active and do not regularly use condoms or know the HIV status of your partner(s).  You take drugs by injection.  You are sexually active with a partner who has HIV. Talk with your health care provider about whether you are at high risk of being infected with HIV. If you choose to begin PrEP, you should first be tested for HIV. You should then be tested  every 3 months for as long as you are taking PrEP.  PREGNANCY   If you are premenopausal and you may become pregnant, ask your health care provider about preconception counseling.  If you may become pregnant, take 400 to 800 micrograms (mcg) of folic acid every day.  If you want to prevent pregnancy, talk to your health care provider about birth control (contraception). OSTEOPOROSIS AND MENOPAUSE   Osteoporosis is a disease in which the bones lose minerals and strength with aging. This can result in serious bone fractures. Your risk for osteoporosis can be identified using a bone density scan.  If you are 45 years of age or older, or if you are at risk for osteoporosis and fractures, ask your health care provider if you should be screened.  Ask your health care provider whether you should take a calcium or vitamin D supplement to lower your risk for osteoporosis.  Menopause may have certain physical symptoms and risks.  Hormone replacement therapy may reduce some of these symptoms and risks. Talk to your health care provider about whether hormone replacement therapy is right for you.  HOME CARE INSTRUCTIONS   Schedule regular health, dental, and eye exams.  Stay current with your immunizations.   Do not use any tobacco products including cigarettes, chewing tobacco, or electronic cigarettes.  If you are pregnant, do not drink alcohol.  If you are breastfeeding, limit how much and how often you drink alcohol.  Limit alcohol intake to no more than 1 drink per day for nonpregnant women. One  drink equals 12 ounces of beer, 5 ounces of wine, or 1 ounces of hard liquor.  Do not use street drugs.  Do not share needles.  Ask your health care provider for help if you need support or information about quitting drugs.  Tell your health care provider if you often feel depressed.  Tell your health care provider if you have ever been abused or do not feel safe at home.   This information is not intended to replace advice given to you by your health care provider. Make sure you discuss any questions you have with your health care provider.   Document Released: 05/06/2011 Document Revised: 11/11/2014 Document Reviewed: 09/22/2013 Elsevier Interactive Patient Education [employer].

## 2015-11-08 NOTE — Progress Notes (Signed)
   Subjective:    Patient ID: [name], female    DOB: [birth year], 70 y.o.   MRN: [id number]  HPI Here for medicare wellness, no new complaints. Please see A/P for status and treatment of chronic medical problems.   Diet: DM if diabetic Physical activity: active Depression/mood screen: negative Hearing: intact to whispered voice Visual acuity: grossly normal with lens, performs annual eye exam  ADLs: capable Fall risk: none Home safety: good Cognitive evaluation: intact to orientation, naming, recall and repetition EOL planning: adv directives discussed  I have personally reviewed and have noted 1. The patient's medical and social history - reviewed today no changes 2. Their use of alcohol, tobacco or illicit drugs 3. Their current medications and supplements 4. The patient's functional ability including ADL's, fall risks, home safety risks and hearing or visual impairment. 5. Diet and physical activities 6. Evidence for depression or mood disorders 7. Care team reviewed and updated (available in snapshot)  Review of Systems  Constitutional: Negative for fever, activity change, appetite change, fatigue and unexpected weight change.  HENT: Negative.   Eyes: Negative.   Respiratory: Negative for cough, chest tightness, shortness of breath and wheezing.   Cardiovascular: Negative for chest pain, palpitations and leg swelling.  Gastrointestinal: Negative for nausea, abdominal pain, diarrhea, constipation and abdominal distention.  Musculoskeletal: Negative.   Skin: Negative.   Neurological: Negative.   Psychiatric/Behavioral: Negative.       Objective:   Physical Exam  Constitutional: She is oriented to person, place, and time. She appears well-developed and well-nourished.  HENT:  Head: Normocephalic and atraumatic.  Eyes: EOM are normal.  Neck: Normal range of motion.  Cardiovascular: Normal rate and regular rhythm.   Pulmonary/Chest: Effort normal and breath sounds  normal. No respiratory distress. She has no wheezes. She has no rales.  Abdominal: Soft. Bowel sounds are normal. She exhibits no distension. There is no tenderness. There is no rebound.  Musculoskeletal: She exhibits no edema.  Neurological: She is alert and oriented to person, place, and time. Coordination normal.  Skin: Skin is warm and dry.  Psychiatric: She has a normal mood and affect.   Filed Vitals:   11/08/15 0837  BP: 148/78  Pulse: 73  Temp: 97.6 F (36.4 C)  TempSrc: Oral  Resp: 12  Height: 5' 9.5" (1.765 m)  Weight: 207 lb (93.895 kg)  SpO2: 97%      Assessment & Plan:

## 2015-11-08 NOTE — Progress Notes (Signed)
Pre visit review using our clinic review tool, if applicable. No additional management support is needed unless otherwise documented below in the visit note. 

## 2015-11-09 LAB — HEPATITIS C ANTIBODY: HCV Ab: NEGATIVE

## 2015-11-20 ENCOUNTER — Encounter: Payer: Self-pay | Admitting: Endocrinology

## 2015-12-10 ENCOUNTER — Other Ambulatory Visit: Payer: Self-pay | Admitting: Endocrinology

## 2015-12-19 ENCOUNTER — Other Ambulatory Visit (INDEPENDENT_AMBULATORY_CARE_PROVIDER_SITE_OTHER): Payer: Medicare Other

## 2015-12-19 DIAGNOSIS — E1065 Type 1 diabetes mellitus with hyperglycemia: Secondary | ICD-10-CM | POA: Diagnosis not present

## 2015-12-19 LAB — COMPREHENSIVE METABOLIC PANEL
ALT: 36 U/L — AB (ref 0–35)
AST: 26 U/L (ref 0–37)
Albumin: 3.9 g/dL (ref 3.5–5.2)
Alkaline Phosphatase: 104 U/L (ref 39–117)
BILIRUBIN TOTAL: 0.5 mg/dL (ref 0.2–1.2)
BUN: 17 mg/dL (ref 6–23)
CO2: 30 meq/L (ref 19–32)
Calcium: 9.5 mg/dL (ref 8.4–10.5)
Chloride: 104 mEq/L (ref 96–112)
Creatinine, Ser: 0.79 mg/dL (ref 0.40–1.20)
GFR: 76.92 mL/min (ref >60.00)
GLUCOSE: 96 mg/dL (ref 70–99)
Potassium: 4.2 mEq/L (ref 3.5–5.1)
SODIUM: 140 meq/L (ref 135–145)
Total Protein: 6.7 g/dL (ref 6.0–8.3)

## 2015-12-19 LAB — HEMOGLOBIN A1C: HEMOGLOBIN A1C: 7.3 % — AB (ref 4.6–6.5)

## 2015-12-22 ENCOUNTER — Encounter: Payer: Self-pay | Admitting: Endocrinology

## 2015-12-22 ENCOUNTER — Other Ambulatory Visit: Payer: Self-pay | Admitting: *Deleted

## 2015-12-22 ENCOUNTER — Ambulatory Visit (INDEPENDENT_AMBULATORY_CARE_PROVIDER_SITE_OTHER): Payer: Medicare Other | Admitting: Endocrinology

## 2015-12-22 VITALS — BP 126/66 | HR 80 | Temp 97.7°F | Resp 16 | Ht 69.5 in | Wt 208.2 lb

## 2015-12-22 DIAGNOSIS — E782 Mixed hyperlipidemia: Secondary | ICD-10-CM | POA: Diagnosis not present

## 2015-12-22 DIAGNOSIS — E1065 Type 1 diabetes mellitus with hyperglycemia: Secondary | ICD-10-CM

## 2015-12-22 MED ORDER — METFORMIN HCL ER 500 MG PO TB24: ORAL_TABLET | ORAL | Status: DC

## 2015-12-22 MED ORDER — CLOPIDOGREL BISULFATE 75 MG PO TABS: ORAL_TABLET | ORAL | Status: DC

## 2015-12-22 MED ORDER — PIOGLITAZONE HCL 15 MG PO TABS: 15.0000 mg | ORAL_TABLET | Freq: Every day | ORAL | Status: DC

## 2015-12-22 MED ORDER — ROSUVASTATIN CALCIUM 40 MG PO TABS: 40.0000 mg | ORAL_TABLET | Freq: Every day | ORAL | Status: DC

## 2015-12-22 NOTE — Patient Instructions (Signed)
Basal 2.3 at 5 pm

## 2015-12-22 NOTE — Progress Notes (Signed)
Patient ID: [name], female   DOB: [birth year], 70 y.o.   MRN: [id number]   Reason for visit: DIABETES followup.  Diagnosis: Type 1 diabetes mellitus, date of diagnosis: 1979.    HISTORY of present illness:  Insulin Pump: CURRENT brand: One Touch Ping  The pump settings are: Midnight = 1.0, 2 AM = 1.0, 5 AM = 1.95, 8 AM = 1.85, 12 noon = 1.2 and 5 PM = 2.4, 7 PM = 2.8 and 10 PM = 2.0.  Total daily basal = 41 unit   Carbohydrate ratio 1:9 lunch and 1:4 at dinner; sensitivity 1: 35, 30 after 5 p.m.; target 120 +/-10  Insulin on board indicator on, duration 4 hrs   DIABETES: She has had long-standing diabetes with A1c levels usually above target. In 10/13 ACTOS was restarted because of her large insulin requirement and diabetic dyslipidemia.  Her blood sugars improved significantly reduced.  Also has been taking metformin as insulin sensitizer  RECENT history:  Her A1c is relatively higher at 7.3 although on the previous visit when it was 6.7 she was having more hypoglycemia  Current blood sugar patterns from pump and Sensor download, management and problems identified:  OVERNIGHT glucose is highly variable over the last 2 weeks with no consistent pattern and occasionally has very high readings in on a couple of nights blood sugars are low normal or low especially until 3 AM: Not clear what causes these variable patterns.  Sugar may be somewhat higher fasting depending on her previous night patterns   She says she has increased her early morning basal recently with higher fasting readings and these are starting to be better  Postprandial readings are not unusually high or low at any time; does have occasional significant high readings after lunch at times based on what she is eating or how well she is covering  Recently had problem with her infusion set causing very high high readings on 1 morning  She is finding the continuous glucose monitoring fairly accurate  usually  AVERAGE blood sugar with DexCom Sensor is reading 155 recently, higher on fingerstick  fingersticks is only 151 and similar on her continuous glucose monitoring  She still has not started any consistent exercise regimen although has done a little  Hitting part of her variability is because of her stress level and having to go back and forth to take care of her mother  COMPLIANCE with boluses has been fairly good although difficult to assess because she often does not enter her carbohydrates at the time of her meals or snacks  Monitors blood glucose: on an average about 5 times a day according to pump download.  Blood Glucose readings on her monitor  Mean values apply above for all meters except median for One Touch  PRE-MEAL Fasting Lunch Dinner Bedtime Overall  Glucose range:  109-442   98-217   60-271   51-303    Mean/median:      168+/-73     CONTINUOUS GLUCOSE MONITORING RECORD INTERPRETATION    Dates of Recording:   Sensor  summary:  Average blood glucose for the period of recording is 155 with standard deviation 65 and she has 10% readings in the low range, 58% high and 33% within the 80-130 range      Glycemic patterns:  Hyperglycemic episodes are occurring mostly overnight although not the same time every night.  Hypoglycemic episodes occurred twice during the early hours of the night and also at least  once around 6 PM recently      Overnight periods:   her blood sugars are quite variable starting at midnight ranging from 50 up to over 300.  If the blood sugars are high initially they may continue to be high all night long or improve as on 2/14.  She started off with low reading on one of the nights until 3 AM otherwise no significant hypoglycemia recently       Preprandial periods:   blood sugars at 8 AM are averaging 155, at 1 PM 158 and at 7 PM 125 and a fairly consistent except at breakfast time      Postprandial periods:   blood sugars are generally not going  unusually high after breakfast or supper but has a at least 3 glucose peaks after lunch up to 300      Hypoglycemia:  as above this is inconsistent, either overnight or transiently at 12 noon or 6 PM.  On 2/4 her glucose was low after 11 PM also      Hypoglycemic awareness: Has symptoms of feeling tired, headache, sweaty. Variable recognition threshold present  Sometimes may not be aware during the night and will be notified by her sensor that the sugar is low  She has symptoms when blood glucose is less than 60. Uses glucose tablets for treatment when not at home but otherwise eats a snack .    DIET has been relatively better recently overall and her mealtimes are variable. Frequently will not eat breakfast.  Usually trying to have low fat meals  Food preferences: Yogurt And cottage cheese for breakfast usually.  Physical activity: exercise: Occasionally Historically after exercise blood sugar will get lower in 1-2 hrs. .  Certified Diabetes Educator visit: Most recent: 8/12.   She is usually regular with her eye exams  Wt Readings from Last 3 Encounters:  12/22/15 208 lb 3.2 oz (94.439 kg)  11/08/15 207 lb (93.895 kg)  09/22/15 207 lb 12.8 oz (94.257 kg)    LABS:  Lab Results  Component Value Date   HGBA1C 7.3* 12/19/2015   HGBA1C 6.7* 09/18/2015   HGBA1C 7.0* 06/19/2015   Lab Results  Component Value Date   MICROALBUR <0.7 06/19/2015   LDLCALC 60 09/18/2015   CREATININE 0.79 12/19/2015    Lab on 12/19/2015  Component Date Value Ref Range Status  . Hgb A1c MFr Bld 12/19/2015 7.3* 4.6 - 6.5 % Final   Glycemic Control Guidelines for People with Diabetes:Non Diabetic:  <6%Goal of Therapy: <7%Additional Action Suggested:  >8%   . Sodium 12/19/2015 140  135 - 145 mEq/L Final  . Potassium 12/19/2015 4.2  3.5 - 5.1 mEq/L Final  . Chloride 12/19/2015 104  96 - 112 mEq/L Final  . CO2 12/19/2015 30  19 - 32 mEq/L Final  . Glucose, Bld 12/19/2015 96  70 - 99 mg/dL Final  . BUN  12/19/2015 17  6 - 23 mg/dL Final  . Creatinine, Ser 12/19/2015 0.79  0.40 - 1.20 mg/dL Final  . Total Bilirubin 12/19/2015 0.5  0.2 - 1.2 mg/dL Final  . Alkaline Phosphatase 12/19/2015 104  39 - 117 U/L Final  . AST 12/19/2015 26  0 - 37 U/L Final  . ALT 12/19/2015 36* 0 - 35 U/L Final  . Total Protein 12/19/2015 6.7  6.0 - 8.3 g/dL Final  . Albumin 12/19/2015 3.9  3.5 - 5.2 g/dL Final  . Calcium 12/19/2015 9.5  8.4 - 10.5 mg/dL Final  . GFR 12/19/2015  76.92  >60.00 mL/min Final      Medication List       This list is accurate as of: 12/22/15  7:54 PM.  Always use your most recent med list.               aspirin EC 81 MG tablet  Take 1 tablet (81 mg total) by mouth daily.     CALCIUM + D PO  Take 600 mg by mouth 2 (two) times daily.     clopidogrel 75 MG tablet  Commonly known as:  PLAVIX  Take 1 tablet by mouth  daily     doxycycline 100 MG capsule  Commonly known as:  VIBRAMYCIN  Take 100 mg by mouth daily.     glucose blood test strip  Commonly known as:  ONE TOUCH ULTRA TEST  Use as instructed to check blood sugar 6 times per day dx code E10.65     HUMALOG 100 UNIT/ML injection  Generic drug:  insulin lispro  USE MAX 200 UNITS PER DAY WITH INSULIN PUMP     ibuprofen 200 MG tablet  Commonly known as:  ADVIL,MOTRIN  Take 200 mg by mouth every 6 (six) hours as needed.     insulin pump Soln  Inject into the skin. Novolog Vial, max 200 units per day with pump     metFORMIN 500 MG 24 hr tablet  Commonly known as:  GLUCOPHAGE-XR  Take 2 tablets by mouth  daily     metoprolol succinate 100 MG 24 hr tablet  Commonly known as:  TOPROL-XL  TAKE 1 TABLET (100 MG TOTAL) BY MOUTH DAILY.     metroNIDAZOLE 0.75 % gel  Commonly known as:  METROGEL     niacin 1000 MG CR tablet  Commonly known as:  NIASPAN  Take 1 tablet by mouth at  bedtime     nitroGLYCERIN 0.4 MG SL tablet  Commonly known as:  NITROSTAT  Place 1 tablet (0.4 mg total) under the tongue every 5  (five) minutes as needed for chest pain.     pioglitazone 15 MG tablet  Commonly known as:  ACTOS  Take 1 tablet (15 mg total) by mouth daily.     rosuvastatin 40 MG tablet  Commonly known as:  CRESTOR  Take 1 tablet (40 mg total) by mouth daily.     tretinoin 0.1 % cream  Commonly known as:  RETIN-A     VITAMIN D PO  Take 1,000 mg by mouth 2 (two) times daily.        Allergies:  Allergies  Allergen Reactions  . Septra [Bactrim] Rash  . Sulfa Antibiotics Rash    Past Medical History  Diagnosis Date  . Coronary artery disease     2009 LAD Promus stent  . Hyperlipidemia   . Osteopenia   . Arthritis   . Type 1 diabetes mellitus on insulin therapy ([place])   . Fibroid   . Atrial flutter ([place])     a. s/p TEE/DCCV 08/16/13 (normal LV function, no LAA thrombus).b. s/p ablation by Dr Lovena Le 09-23-2013  . Valvular heart disease     a. Mild MR/TR by TEE 08/2013.    Past Surgical History  Procedure Laterality Date  . Coronary angioplasty with stent placement    . Pelvic laparoscopy    . Myomectomy  1977  . Appendectomy    . Cataract extraction Bilateral   . Tee without cardioversion N/A 08/16/2013    Procedure: TRANSESOPHAGEAL ECHOCARDIOGRAM (TEE);  Surgeon: Thayer Headings, MD;  Location: [facility];  Service: Cardiovascular;  Laterality: N/A;  . Cardioversion N/A 08/16/2013    Procedure: CARDIOVERSION;  Surgeon: Thayer Headings, MD;  Location: [facility];  Service: Cardiovascular;  Laterality: N/A;  spoke with [name]  . Ablation  09-23-2013    CTI by Dr Lovena Le  . Colonoscopy    . Atrial flutter ablation N/A 09/23/2013    Procedure: ATRIAL FLUTTER ABLATION;  Surgeon: Evans Lance, MD;  Location: [facility] C[facility] LAB;  Service: Cardiovascular;  Laterality: N/A;    Family History  Problem Relation Age of Onset  . Atrial fibrillation Mother   . Diabetes Mother   . Hypertension Mother   . CAD Father 60    died age 65  . CAD Brother 69    small vessel disease  . CAD Cousin      paternal  . CAD Paternal Grandfather     early onset  . Colon cancer Neg Hx     Social History:  reports that she has never smoked. She has never used smokeless tobacco. She reports that she drinks about 1.2 oz of alcohol per week. She reports that she does not use illicit drugs.  REVIEW of systems:   She has had diabetic dyslipidemia with significantly high LDL particle number and this has been managed with Crestor 40 mg and Niaspan 1000 mg In 3/16 her particle number was below 1000 LDL is below 70   Lab Results  Component Value Date   CHOL 128 09/18/2015   HDL 55.30 09/18/2015   LDLCALC 60 09/18/2015   LDLDIRECT 79.0 01/11/2015   TRIG 63.0 09/18/2015   CHOLHDL 2 09/18/2015   No significant hypertension and has had atrial arrhythmias treated with metoprolol    BP 126/66 mmHg  Pulse 80  Temp(Src) 97.7 F (36.5 C)  Resp 16  Ht 5' 9.5" (1.765 m)  Wt 208 lb 3.2 oz (94.439 kg)  BMI 30.32 kg/m2  SpO2 97%   ASSESSMENT/PLAN:  DIABETES type 1 with fair control, A1c is somewhat higher at 7.3 but previously has had more hypoglycemia when A1c was 6.7  See history of present illness for detailed discussion of her current blood sugar patterns and management with her pump and interpretation of her continuous glucose sensor Despite using the continuous glucose monitoring she is still having marked variability in blood sugars and difficulty getting consistent control Not clear why she has significant variability overnight Fasting readings are recently better with her increasing her early morning basal rates on her own  Hypoglycemia: She has occasionally had low sugars in the early hours of the night but only transiently at other times during the day Most likely tends to be low normal fairly consistently around 6 PM and may feel hypoglycemic especially late for supper  HYPERGLYCEMIA: This is occurring at variable times but mostly on some of the night when glucose goes up  after midnight Also may not be able to manage her higher fat meals at times especially lunchtime despite instructions on adding extra for higher fat meals and extended boluses  Recommendations:  She will reduce her basal rate at 5 PM to 2.3 for now and more if needed  Consider reducing basal rate at midnight if blood sugars tend to be low more often  Consistent bolusing for higher fat meals with extra insulin  Regular exercise program  Discussed the newer technology options with closed loop insulin pump and she will look into getting this later  this year  Hyperlipidemia: Will need follow-up LDL particle number  Counseling time over 50% of today's 25 minute visit on various topics as above  Camaryn Lumbert

## 2016-01-23 DIAGNOSIS — Z1231 Encounter for screening mammogram for malignant neoplasm of breast: Secondary | ICD-10-CM | POA: Diagnosis not present

## 2016-01-23 LAB — HM MAMMOGRAPHY

## 2016-02-07 ENCOUNTER — Encounter: Payer: Self-pay | Admitting: Family Medicine

## 2016-02-07 ENCOUNTER — Other Ambulatory Visit (INDEPENDENT_AMBULATORY_CARE_PROVIDER_SITE_OTHER): Payer: Medicare Other

## 2016-02-07 ENCOUNTER — Ambulatory Visit (INDEPENDENT_AMBULATORY_CARE_PROVIDER_SITE_OTHER): Payer: Medicare Other | Admitting: Family Medicine

## 2016-02-07 VITALS — BP 132/72 | HR 78 | Wt 209.0 lb

## 2016-02-07 DIAGNOSIS — S8261XA Displaced fracture of lateral malleolus of right fibula, initial encounter for closed fracture: Secondary | ICD-10-CM

## 2016-02-07 DIAGNOSIS — M79671 Pain in right foot: Secondary | ICD-10-CM

## 2016-02-07 NOTE — Assessment & Plan Note (Signed)
Small nondisplaced fracture noted. Patient is already able to bear weight. Patient given an Aircast for lateral stability. We discussed range of motion exercises. We discussed icing. Patient can wear shoes but to increase slowly. We discussed any worsening symptoms to come back sooner but otherwise follow-up in 3 weeks to make sure good callus formation. Encourage patient to continue vitamin D supplementation.

## 2016-02-07 NOTE — Patient Instructions (Signed)
Good to see you  Ice is your friend Ice 20 minutes 2 times daily. Usually after activity and before bed. Wear brace when it feels good.  Boot maybe for 3 days See me again in 3 weeks if not perfect or to check on the bone.

## 2016-02-07 NOTE — Progress Notes (Signed)
[name] [place], [place] [address] Phone: [phone] Subjective:    CC: Right ankle pain  [id number] [name] is a 70 y.o. female coming in with complaint of right ankle pain. 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 Patient though unfortunately 5 days ago did fall onto her right side. Had pain in her ankle immediately on the lateral aspect. Had difficulty even bearing weight for the first 2 days. Patient states started today she was able to bear weight and states that it feels 50-60% better already. There had any significant swelling. [name] states though that it still has some soreness mostly on the lateral aspect the ankle. Rates the severity is 5 out of 10. Once make sure that that she does not have a fracture and wait longer causes what happened on the contralateral side.  Past Medical History  Diagnosis Date  . Coronary artery disease     2009 LAD Promus stent  . Hyperlipidemia   . Osteopenia   . Arthritis   . Type 1 diabetes mellitus on insulin therapy ([place])   . Fibroid   . Atrial flutter ([place])     a. s/p TEE/DCCV 08/16/13 (normal LV function, no LAA thrombus).b. s/p ablation by Dr Lovena Le 09-23-2013  . Valvular heart disease     a. Mild MR/TR by TEE 08/2013.   Past Surgical History  Procedure Laterality Date  . Coronary angioplasty with stent placement    . Pelvic laparoscopy    . Myomectomy  1977  . Appendectomy    . Cataract extraction Bilateral   . Tee without cardioversion N/A 08/16/2013    Procedure: TRANSESOPHAGEAL ECHOCARDIOGRAM (TEE);  Surgeon: Thayer Headings, MD;  Location: [facility];  Service: Cardiovascular;  Laterality: N/A;  . Cardioversion N/A 08/16/2013    Procedure: CARDIOVERSION;  Surgeon: Thayer Headings, MD;  Location: [facility];  Service: Cardiovascular;  Laterality: N/A;  spoke with [name]  . Ablation   09-23-2013    CTI by Dr Lovena Le  . Colonoscopy    . Atrial flutter ablation N/A 09/23/2013    Procedure: ATRIAL FLUTTER ABLATION;  Surgeon: Evans Lance, MD;  Location: [facility] CATH LAB;  Service: Cardiovascular;  Laterality: N/A;   Social History  Substance Use Topics  . Smoking status: Never Smoker   . Smokeless tobacco: Never Used  . Alcohol Use: 1.2 oz/week    2 Glasses of wine per week   Allergies  Allergen Reactions  . Septra [Bactrim] Rash  . Sulfa Antibiotics Rash   Family History  Problem Relation Age of Onset  . Atrial fibrillation Mother   . Diabetes Mother   . Hypertension Mother   . CAD Father 90    died age 41  . CAD Brother 66    small vessel disease  . CAD Cousin     paternal  . CAD Paternal Grandfather     early onset  . Colon cancer Neg Hx         Past medical history, social, surgical and family history all reviewed in electronic medical record.   Review of Systems: No headache, visual changes, nausea, vomiting, diarrhea, constipation, dizziness, abdominal pain, skin rash, fevers, chills, night sweats, weight loss, swollen lymph nodes, body aches, joint swelling, muscle aches, chest pain, shortness of breath, mood changes.   Objective Blood pressure 132/72, pulse 78, weight 209 lb (94.802 kg).  General: No apparent distress alert and oriented x3 mood and affect normal, dressed appropriately.  HEENT: Pupils equal, extraocular movements intact  Respiratory: Patient's speak in full sentences and does not appear short of breath  Cardiovascular: No lower extremity edema, non tender, no erythema  Skin: Warm dry intact with no signs of infection or rash on extremities or on axial skeleton.  Abdomen: Soft nontender  Neuro: Cranial nerves II through XII are intact, neurovascularly intact in all extremities with 2+ DTRs and 2+ pulses.  Lymph: No lymphadenopathy of posterior or anterior cervical chain or axillae bilaterally.  Gait normal with good balance and  coordination.  MSK:  Non tender with full range of motion and good stability and symmetric strength and tone of shoulders, elbows, wrist, hip, knee and bilaterally. Mild arthritic changes of other joints.   Ankle: Right Trace swelling noted over the lateral malleolus and lateral hindfoot Lacking the last 5 of movement in all directions.. Strength is 5/5 in all directions. Stable lateral and medial ligaments; squeeze test and kleiger test unremarkable; Talar dome nontender; No pain at base of 5th MT; No tenderness over cuboid; No tenderness over N spot or navicular prominence Minorly tender to palpation over the anterior lateral malleolus area. Mild pain over the ATFL No sign of peroneal tendon subluxations or tenderness to palpation Negative tarsal tunnel tinel's Able to walk 4 steps. Contralateral foot does have arthritic changes in patient does have some limitation in range of motion of the ankle but is nontender on exam.   MSK US performed of: Right ankle This study was ordered, performed, and interpreted by Charlann Boxer D.O.  Foot/Ankle:   All structures visualized.   Talar dome unremarkable except for moderate arthritic changes Ankle mortise without effusion. Peroneus longus and brevis tendons unremarkable on long and transverse views without sheath effusions. Patient does have an overlying peroneal cyst Posterior tibialis, flexor hallucis longus, and flexor digitorum longus tendons unremarkable on long and transverse views without sheath effusions. Achilles tendon visualized along length of tendon and unremarkable on long and transverse views without sheath effusion. Anterior Talofibular Ligament does have hypoechoic changes but no true gapping noted. Seems to be intact. and Calcaneofibular Ligaments unremarkable and intact. Deltoid Ligament unremarkable and intact. Patient's lateral malleolus anteriorly does seem to have very small avulsion fracture that seems to be nondisplaced.  Hypoechoic changes and increasing Doppler flow noted. Plantar fascia intact and without effusion, normal thickness. No increased doppler signal, cap sign, or thickening of tibial cortex.   IMPRESSION:  Small lateral malleolus avulsion fracture nondisplaced with grade 2 ankle sprain.     Impression and Recommendations:     This case required medical decision making of moderate complexity.

## 2016-02-08 ENCOUNTER — Encounter: Payer: Self-pay | Admitting: Geriatric Medicine

## 2016-02-14 DIAGNOSIS — E119 Type 2 diabetes mellitus without complications: Secondary | ICD-10-CM | POA: Diagnosis not present

## 2016-02-14 DIAGNOSIS — Z79899 Other long term (current) drug therapy: Secondary | ICD-10-CM | POA: Diagnosis not present

## 2016-02-14 DIAGNOSIS — H5203 Hypermetropia, bilateral: Secondary | ICD-10-CM | POA: Diagnosis not present

## 2016-02-14 LAB — HM DIABETES EYE EXAM

## 2016-02-26 ENCOUNTER — Encounter: Payer: Self-pay | Admitting: Family Medicine

## 2016-02-26 ENCOUNTER — Ambulatory Visit (INDEPENDENT_AMBULATORY_CARE_PROVIDER_SITE_OTHER): Payer: Medicare Other | Admitting: Family Medicine

## 2016-02-26 VITALS — BP 124/76 | HR 72 | Wt 205.0 lb

## 2016-02-26 DIAGNOSIS — S8261XD Displaced fracture of lateral malleolus of right fibula, subsequent encounter for closed fracture with routine healing: Secondary | ICD-10-CM

## 2016-02-26 NOTE — Patient Instructions (Signed)
Always great to see you  You do have a ligament tear and will take more time.  Ice is your friend Otherwise see me when you need me.

## 2016-02-26 NOTE — Progress Notes (Signed)
[name] [place], [place] [address] Phone: [phone] Subjective:    CC: Right ankle pain f/u  [id number] [name] is a 70 y.o. female coming in with complaint of right ankle pain. 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 Patient though unfortunately 5 days ago did fall onto her right sid Patient recently did have a grade 2 ankle sprain with very small avulsion fracture. Patient has been doing conservative therapy. Feels like she is doing well. Patient states she feels like she has some mild instability but states that the pain is improving. States that she does very well until she does some type twisting motion.  Past Medical History  Diagnosis Date  . Coronary artery disease     2009 LAD Promus stent  . Hyperlipidemia   . Osteopenia   . Arthritis   . Type 1 diabetes mellitus on insulin therapy ([place])   . Fibroid   . Atrial flutter ([place])     a. s/p TEE/DCCV 08/16/13 (normal LV function, no LAA thrombus).b. s/p ablation by Dr Lovena Le 09-23-2013  . Valvular heart disease     a. Mild MR/TR by TEE 08/2013.   Past Surgical History  Procedure Laterality Date  . Coronary angioplasty with stent placement    . Pelvic laparoscopy    . Myomectomy  1977  . Appendectomy    . Cataract extraction Bilateral   . Tee without cardioversion N/A 08/16/2013    Procedure: TRANSESOPHAGEAL ECHOCARDIOGRAM (TEE);  Surgeon: Thayer Headings, MD;  Location: [facility];  Service: Cardiovascular;  Laterality: N/A;  . Cardioversion N/A 08/16/2013    Procedure: CARDIOVERSION;  Surgeon: Thayer Headings, MD;  Location: [facility];  Service: Cardiovascular;  Laterality: N/A;  spoke with [name]  . Ablation  09-23-2013    CTI by Dr Lovena Le  . Colonoscopy    . Atrial flutter ablation N/A 09/23/2013    Procedure: ATRIAL FLUTTER ABLATION;  Surgeon: Evans Lance, MD;  Location: [facility]  CATH LAB;  Service: Cardiovascular;  Laterality: N/A;   Social History  Substance Use Topics  . Smoking status: Never Smoker   . Smokeless tobacco: Never Used  . Alcohol Use: 1.2 oz/week    2 Glasses of wine per week   Allergies  Allergen Reactions  . Septra [Bactrim] Rash  . Sulfa Antibiotics Rash   Family History  Problem Relation Age of Onset  . Atrial fibrillation Mother   . Diabetes Mother   . Hypertension Mother   . CAD Father 39    died age 82  . CAD Brother 45    small vessel disease  . CAD Cousin     paternal  . CAD Paternal Grandfather     early onset  . Colon cancer Neg Hx         Past medical history, social, surgical and family history all reviewed in electronic medical record.   Review of Systems: No headache, visual changes, nausea, vomiting, diarrhea, constipation, dizziness, abdominal pain, skin rash, fevers, chills, night sweats, weight loss, swollen lymph nodes, body aches, joint swelling, muscle aches, chest pain, shortness of breath, mood changes.   Objective Blood pressure 124/76, pulse 72, weight 205 lb (92.987 kg).  General: No apparent distress alert and oriented x3 mood and affect normal, dressed appropriately.  HEENT: Pupils equal, extraocular movements intact  Respiratory: Patient's speak in full sentences and does not appear  short of breath  Cardiovascular: No lower extremity edema, non tender, no erythema  Skin: Warm dry intact with no signs of infection or rash on extremities or on axial skeleton.  Abdomen: Soft nontender  Neuro: Cranial nerves II through XII are intact, neurovascularly intact in all extremities with 2+ DTRs and 2+ pulses.  Lymph: No lymphadenopathy of posterior or anterior cervical chain or axillae bilaterally.  Gait normal with good balance and coordination.  MSK:  Non tender with full range of motion and good stability and symmetric strength and tone of shoulders, elbows, wrist, hip, knee and bilaterally. Mild arthritic  changes of other joints.   Ankle: Right Less swelling than previous exam Improved range of motion.. Strength is 5/5 in all directions. Stable lateral and medial ligaments; squeeze test and kleiger test unremarkable; Talar dome nontender; No pain at base of 5th MT; No tenderness over cuboid; No tenderness over N spot or navicular prominence Or tenderness over the ATFL with operative anterior drawer No sign of peroneal tendon subluxations or tenderness to palpation Negative tarsal tunnel tinel's Able to walk 4 steps. Contralateral foot does have arthritic changes in patient does have some limitation in range of motion of the ankle but is nontender on exam.    MSK US performed of: Right ankle This study was ordered, performed, and interpreted by Charlann Boxer D.O.  Foot/Ankle:   Limited ultrasound shows the patient does have worsening swelling and what appears to be a full-thickness tear of the ATFL. Patient still has some mild soft tissue swelling in the area. Area of avulsion fracture seems to be improved at this time.   IMPRESSION:  Patient does have a full tear of the ATFL with good resolution of the avulsion fracture seen previously.     Impression and Recommendations:     This case required medical decision making of moderate complexity.

## 2016-02-26 NOTE — Assessment & Plan Note (Signed)
Seems to be healing overall except for the tear of the ATFL. Patient has now seems to be a full-thickness tear. We discussed bracing. Patient will do this as well as an icing protocol. We discussed avoiding any significant running type motions. Depending on how patient doesI believe that she will do well. With patient's age and other comorbidities she will likely would not doing great with a surgery. Patient and will come back and see me again in 4 weeks if not resolved.  Spent  25 minutes with patient face-to-face and had greater than 50% of counseling including as described above in assessment and plan.

## 2016-02-28 ENCOUNTER — Telehealth: Payer: Self-pay | Admitting: Nutrition

## 2016-02-28 NOTE — Telephone Encounter (Signed)
Agree 

## 2016-02-28 NOTE — Telephone Encounter (Signed)
"  My blood sugars are dropping all the time."  She started weight watcher, has dropped about 5 pounds, and has given up her 3-4 glasses of wine every night, and is limiting her carbs to no more than 45 grams per meal--usually only 30 now.  Dropping low 1-2 hours after all meal, eating Nabs without bolusing, and then dropping again before the next meal,and during the night--after midnight, and again around 4AM  Plan:  Decrease basal rate by 0.2/hr, and increase I/C ratio from Bfast: 9 to 15 , and after 5PM: from 4 to 8.  Call if continuing to drop, or go high.

## 2016-03-05 ENCOUNTER — Encounter: Payer: Self-pay | Admitting: *Deleted

## 2016-03-11 ENCOUNTER — Other Ambulatory Visit (INDEPENDENT_AMBULATORY_CARE_PROVIDER_SITE_OTHER): Payer: Medicare Other

## 2016-03-11 DIAGNOSIS — E1065 Type 1 diabetes mellitus with hyperglycemia: Secondary | ICD-10-CM

## 2016-03-11 DIAGNOSIS — E782 Mixed hyperlipidemia: Secondary | ICD-10-CM

## 2016-03-11 LAB — COMPREHENSIVE METABOLIC PANEL
ALK PHOS: 94 U/L (ref 39–117)
ALT: 20 U/L (ref 0–35)
AST: 22 U/L (ref 0–37)
Albumin: 4 g/dL (ref 3.5–5.2)
BILIRUBIN TOTAL: 0.4 mg/dL (ref 0.2–1.2)
BUN: 17 mg/dL (ref 6–23)
CO2: 28 meq/L (ref 19–32)
CREATININE: 0.72 mg/dL (ref 0.40–1.20)
Calcium: 9.2 mg/dL (ref 8.4–10.5)
Chloride: 106 mEq/L (ref 96–112)
GFR: 85.56 mL/min (ref >60.00)
GLUCOSE: 42 mg/dL — AB (ref 70–99)
Potassium: 4.6 mEq/L (ref 3.5–5.1)
Sodium: 140 mEq/L (ref 135–145)
TOTAL PROTEIN: 6.9 g/dL (ref 6.0–8.3)

## 2016-03-11 LAB — HEMOGLOBIN A1C: HEMOGLOBIN A1C: 6.9 % — AB (ref 4.6–6.5)

## 2016-03-11 LAB — LIPID PANEL
CHOL/HDL RATIO: 2
Cholesterol: 127 mg/dL (ref 0–200)
HDL: 54.1 mg/dL (ref >39.00)
LDL Cholesterol: 61 mg/dL (ref 0–99)
NONHDL: 73.04
Triglycerides: 59 mg/dL (ref 0.0–149.0)
VLDL: 11.8 mg/dL (ref 0.0–40.0)

## 2016-03-12 LAB — LIPOPROTEIN ANALYSIS BY NMR
HDL Particle Number: 34.4 umol/L (ref >=30.5)
LDL PARTICLE NUMBER: 867 nmol/L (ref <1000)
LDL SIZE: 20.1 nm (ref >20.5)
LP-IR SCORE: 26 (ref <=45)
SMALL LDL PARTICLE NUMBER: 480 nmol/L (ref <=527)

## 2016-03-18 ENCOUNTER — Ambulatory Visit: Payer: Medicare Other | Admitting: Endocrinology

## 2016-03-19 ENCOUNTER — Encounter: Payer: Self-pay | Admitting: Endocrinology

## 2016-03-19 ENCOUNTER — Ambulatory Visit (INDEPENDENT_AMBULATORY_CARE_PROVIDER_SITE_OTHER): Payer: Medicare Other | Admitting: Endocrinology

## 2016-03-19 VITALS — BP 118/62 | HR 64 | Temp 98.1°F | Resp 14 | Ht 69.5 in | Wt 200.8 lb

## 2016-03-19 DIAGNOSIS — E1065 Type 1 diabetes mellitus with hyperglycemia: Secondary | ICD-10-CM

## 2016-03-19 NOTE — Progress Notes (Addendum)
Patient ID: [name], female   DOB: 12-25-1945, 70 y.o.   MRN: [id number]   Reason for visit: DIABETES followup.  Diagnosis: Type 1 diabetes mellitus, date of diagnosis: 1979.    HISTORY of present illness:  Insulin Pump: CURRENT brand: One Touch Ping  The pump settings are: Midnight = 0.8, 5 AM = 1.75, 8 AM = 1.65, 12 noon = 1.0; 5 PM = 2.1, 7 PM = 2.6 and 10 PM = 1.8.   Total daily insulin = 44.5 units, 80% basal   Carbohydrate ratio 1:15 breakfast, lunch and 1:8 at dinner; sensitivity 1: 35, 30 after 6 AM; target 120 +/-10  Insulin on board indicator on, duration 4 hrs   DIABETES: She has had long-standing diabetes with A1c levels usually above target. In 10/13 ACTOS was restarted because of her large insulin requirement and diabetic dyslipidemia.  Her blood sugars improved significantly reduced.  Also has been taking metformin as insulin sensitizer  RECENT history:  Her A1c is relatively better at 6.9, previously 7.3 Today it was found that her insulin pump was programmed with the a.m. and p.m. times reversed and not clear when this happened  Current blood sugar patterns from pump and Sensor download, management and problems identified:   OVERNIGHT glucose is appearing variable but mostly high with readings going up over 300 on 1 day but also on another day blood sugars were starting at 50; at 2 days with blood sugars were in the diabetic range only  Blood sugars are progressively getting lower between 9 AM-11 AM with frequent hypoglycemia that does not resolve until about 12 PM-1 PM  Also blood sugars are occasionally tending to be low in the afternoons  Blood sugars are mostly trending higher in the late afternoon after 3 PM and about half the time higher in the late evening going into the night  AVERAGE blood sugar on her fingersticks is 130 and on her continuous glucose monitoring is 133 with standard deviation 61 on the CGM  HYPOGLYCEMIA as fairly  frequent between 9 AM and 1 PM  Her blood sugar average on fingersticks ranges from 77 around 10 AM-12 noon up to an average of 213 from 4 AM-6 AM  She has again not entered her carbohydrates at mealtimes consistently in the pump indicates she is only getting 24 g of carbohydrate per day on an average  Her boluses are averaging only about 9 units per day compared to 36 units on the basal  She has cut back on carbohydrates overall because of being on Weight Watchers program and has lost weight  COMPLIANCE with boluses has been fairly good although difficult to assess because she often does not enter her carbohydrates at the time of her meals or snacks  Monitors blood glucose: on an average about 5 times a day according to pump download.  Blood Glucose readings on her monitor as above   Hypoglycemic awareness: Has symptoms of feeling tired, headache, sweaty. Variable recognition threshold present  Sometimes may not be aware during the night and will be notified by her sensor that the sugar is low  She has symptoms when blood glucose is less than 60. Uses glucose tablets for treatment when not at home but otherwise eats a snack .    DIET has been relatively better recently overall and her mealtimes are variable. Frequently will not eat breakfast.  Usually trying to have low fat meals  Food preferences: Yogurt And cottage cheese for breakfast  usually.   Physical activity: exercise: She is trying to walk more frequently and usually does that late morning up to 45 minutes Historically after exercise blood sugar will get lower in 1-2 hrs but sometimes it may get lower while walking and she carries glucose tablets with her. .  Certified Diabetes Educator visit: Most recent: 8/12.    Wt Readings from Last 3 Encounters:  03/19/16 200 lb 12.8 oz (91.082 kg)  02/26/16 205 lb (92.987 kg)  02/07/16 209 lb (94.802 kg)    LABS:  Lab Results  Component Value Date   HGBA1C 6.9* 03/11/2016    HGBA1C 7.3* 12/19/2015   HGBA1C 6.7* 09/18/2015   Lab Results  Component Value Date   MICROALBUR <0.7 06/19/2015   LDLCALC 61 03/11/2016   CREATININE 0.72 03/11/2016    No visits with results within 1 Week(s) from this visit. Latest known visit with results is:  Lab on 03/11/2016  Component Date Value Ref Range Status  . Hgb A1c MFr Bld 03/11/2016 6.9* 4.6 - 6.5 % Final   Glycemic Control Guidelines for People with Diabetes:Non Diabetic:  <6%Goal of Therapy: <7%Additional Action Suggested:  >8%   . Sodium 03/11/2016 140  135 - 145 mEq/L Final  . Potassium 03/11/2016 4.6  3.5 - 5.1 mEq/L Final  . Chloride 03/11/2016 106  96 - 112 mEq/L Final  . CO2 03/11/2016 28  19 - 32 mEq/L Final  . Glucose, Bld 03/11/2016 42* 70 - 99 mg/dL Final  . BUN 03/11/2016 17  6 - 23 mg/dL Final  . Creatinine, Ser 03/11/2016 0.72  0.40 - 1.20 mg/dL Final  . Total Bilirubin 03/11/2016 0.4  0.2 - 1.2 mg/dL Final  . Alkaline Phosphatase 03/11/2016 94  39 - 117 U/L Final  . AST 03/11/2016 22  0 - 37 U/L Final  . ALT 03/11/2016 20  0 - 35 U/L Final  . Total Protein 03/11/2016 6.9  6.0 - 8.3 g/dL Final  . Albumin 03/11/2016 4.0  3.5 - 5.2 g/dL Final  . Calcium 03/11/2016 9.2  8.4 - 10.5 mg/dL Final  . GFR 03/11/2016 85.56  >60.00 mL/min Final  . LDL Particle Number 03/11/2016 [id number]  <1000 nmol/L Final   Comment:                           Low                   < 1000                           Moderate         1000 - 1299                           Borderline-High  1300 - 1599                           High             1600 - 2000                           Very High             > 2000   . HDL Particle Number 03/11/2016 34.4  >=30.5 umol/L Final  . Small LDL Particle Number 03/11/2016  [id number]  <=527 nmol/L Final  . LDL Size 03/11/2016 20.1  >20.5 nm Final   Comment:  ----------------------------------------------------------                  ** INTERPRETATIVE INFORMATION**                  PARTICLE CONCENTRATION  AND SIZE                     <--Lower CVD Risk   Higher CVD Risk-->   LDL AND HDL PARTICLES   Percentile in Reference Population   HDL-P (total)        High     75th    50th    25th   Low                        >34.9    34.9    30.5    26.7   <26.7   Small LDL-P          Low      25th    50th    75th   High                        <117     117     527     839    >839   LDL Size   <-Large (Pattern A)->    <-Small (Pattern B)->                     23.0    20.6           20.5      19.0  ---------------------------------------------------------- Small LDL-P and LDL Size are associated with CVD risk, but not after LDL-P is taken into account. These assays were developed and their performance characteristics determined by [employer]. These assays have not been cleared by the [place] Food and Drug Administration. The clinical utility o                          f these laboratory values have not been fully established.   Marland Kitchen LP-IR Score 03/11/2016 26  <=45 Final   Comment: INSULIN RESISTANCE MARKER     <--Insulin Sensitive    Insulin Resistant-->            Percentile in Reference Population Insulin Resistance Score LP-IR Score   Low   25th   50th   75th   High               <27   27     45     63     >63 LP-IR Score is inaccurate if patient is non-fasting. The LP-IR score is a laboratory developed index that has been associated with insulin resistance and diabetes risk and should be used as one component of a physician's clinical assessment. The LP-IR score listed above has not been cleared by the [place] Food and Drug Administration.   . Cholesterol 03/11/2016 127  0 - 200 mg/dL Final   ATP III Classification       Desirable:  < 200 mg/dL               Borderline High:  200 - 239 mg/dL          High:  > = 240 mg/dL  . Triglycerides 03/11/2016 59.0  0.0 - 149.0 mg/dL Final   Normal:  <150 mg/dLBorderline High:  150 - 199 mg/dL  . HDL 03/11/2016 54.10  >39.00 mg/dL Final  . VLDL 03/11/2016 11.8  0.0  - 40.0 mg/dL Final  . LDL Cholesterol 03/11/2016 61  0 - 99 mg/dL Final  . Total CHOL/HDL Ratio 03/11/2016 2   Final                  Men          Women1/2 Average Risk     3.4          3.3Average Risk          5.0          4.42X Average Risk          9.6          7.13X Average Risk          15.0          11.0                      . NonHDL 03/11/2016 73.04   Final   NOTE:  Non-HDL goal should be 30 mg/dL higher than patient's LDL goal (i.e. LDL goal of < 70 mg/dL, would have non-HDL goal of < 100 mg/dL)      Medication List       This list is accurate as of: 03/19/16  8:31 PM.  Always use your most recent med list.               aspirin 325 MG tablet  Take 325 mg by mouth daily.     CALCIUM + D PO  Take 600 mg by mouth 2 (two) times daily.     cholecalciferol 1000 units tablet  Commonly known as:  VITAMIN D  Take 1,000 Units by mouth daily.     clopidogrel 75 MG tablet  Commonly known as:  PLAVIX  Take 1 tablet by mouth  daily     doxycycline 100 MG capsule  Commonly known as:  VIBRAMYCIN  Take 100 mg by mouth daily.     glucose blood test strip  Commonly known as:  ONE TOUCH ULTRA TEST  Use as instructed to check blood sugar 6 times per day dx code E10.65     HUMALOG 100 UNIT/ML injection  Generic drug:  insulin lispro  USE MAX 200 UNITS PER DAY WITH INSULIN PUMP     ibuprofen 200 MG tablet  Commonly known as:  ADVIL,MOTRIN  Take 200 mg by mouth every 6 (six) hours as needed.     insulin pump Soln  Inject into the skin. Novolog Vial, max 200 units per day with pump     metFORMIN 500 MG 24 hr tablet  Commonly known as:  GLUCOPHAGE-XR  Take 2 tablets by mouth  daily     metoprolol succinate 100 MG 24 hr tablet  Commonly known as:  TOPROL-XL  TAKE 1 TABLET (100 MG TOTAL) BY MOUTH DAILY.     metroNIDAZOLE 0.75 % gel  Commonly known as:  METROGEL     niacin 1000 MG CR tablet  Commonly known as:  NIASPAN  Take 1 tablet by mouth at  bedtime     nitroGLYCERIN  0.4 MG SL tablet  Commonly known as:  NITROSTAT  Place 1 tablet (0.4 mg total) under the tongue every 5 (five) minutes as needed for chest pain.     pioglitazone 15  MG tablet  Commonly known as:  ACTOS  Take 1 tablet (15 mg total) by mouth daily.     rosuvastatin 40 MG tablet  Commonly known as:  CRESTOR  Take 1 tablet (40 mg total) by mouth daily.     tretinoin 0.1 % cream  Commonly known as:  RETIN-A        Allergies:  Allergies  Allergen Reactions  . Septra [Bactrim] Rash  . Sulfa Antibiotics Rash    Past Medical History  Diagnosis Date  . Coronary artery disease     2009 LAD Promus stent  . Hyperlipidemia   . Osteopenia   . Arthritis   . Type 1 diabetes mellitus on insulin therapy ([place])   . Fibroid   . Atrial flutter ([place])     a. s/p TEE/DCCV 08/16/13 (normal LV function, no LAA thrombus).b. s/p ablation by Dr Lovena Le 09-23-2013  . Valvular heart disease     a. Mild MR/TR by TEE 08/2013.    Past Surgical History  Procedure Laterality Date  . Coronary angioplasty with stent placement    . Pelvic laparoscopy    . Myomectomy  1977  . Appendectomy    . Cataract extraction Bilateral   . Tee without cardioversion N/A 08/16/2013    Procedure: TRANSESOPHAGEAL ECHOCARDIOGRAM (TEE);  Surgeon: Thayer Headings, MD;  Location: [facility];  Service: Cardiovascular;  Laterality: N/A;  . Cardioversion N/A 08/16/2013    Procedure: CARDIOVERSION;  Surgeon: Thayer Headings, MD;  Location: [facility];  Service: Cardiovascular;  Laterality: N/A;  spoke with [name]  . Ablation  09-23-2013    CTI by Dr Lovena Le  . Colonoscopy    . Atrial flutter ablation N/A 09/23/2013    Procedure: ATRIAL FLUTTER ABLATION;  Surgeon: Evans Lance, MD;  Location: [facility] CATH LAB;  Service: Cardiovascular;  Laterality: N/A;    Family History  Problem Relation Age of Onset  . Atrial fibrillation Mother   . Diabetes Mother   . Hypertension Mother   . CAD Father 22    died age 27  . CAD Brother  30    small vessel disease  . CAD Cousin     paternal  . CAD Paternal Grandfather     early onset  . Colon cancer Neg Hx     Social History:  reports that she has never smoked. She has never used smokeless tobacco. She reports that she drinks about 1.2 oz of alcohol per week. She reports that she does not use illicit drugs.  REVIEW of systems:   She has had diabetic dyslipidemia with significantly high LDL particle number and this has been managed with Crestor 40 mg and Niaspan 1000 mg In 3/16 her particle number was below 1000 and it is somewhat better now at 856 LDL is below 70, HDL excellent   Lab Results  Component Value Date   CHOL 127 03/11/2016   HDL 54.10 03/11/2016   LDLCALC 61 03/11/2016   LDLDIRECT 79.0 01/11/2015   TRIG 59.0 03/11/2016   CHOLHDL 2 03/11/2016   No significant hypertension and has had atrial arrhythmias treated with metoprolol    BP 118/62 mmHg  Pulse 64  Temp(Src) 98.1 F (36.7 C)  Resp 14  Ht 5' 9.5" (1.765 m)  Wt 200 lb 12.8 oz (91.082 kg)  BMI 29.24 kg/m2  SpO2 94%   ASSESSMENT/PLAN:  DIABETES type 1 with fair control, A1c is somewhat higher at 7.3 but previously has had more hypoglycemia when A1c was  6.7  See history of present illness for detailed discussion of her current blood sugar patterns and management with her pump and interpretation of her continuous glucose sensor Her blood sugar control is made difficult on this visit because of her insulin pump being wrong reprogrammed with the morning and evening times reversed causing inappropriately high basal rates before noon and lower basal rates late evening She is having frequent hypoglycemia late morning and early afternoon and higher readings in the evenings and overnight frequently At the same time she has started Weight Watchers program and is cutting back on carbohydrates and bolusing less overall  The patient changed her insulin pump time in the office Since her settings  will change with changing her time we will need to review her blood sugar patterns again in about 3 weeks Discussed prevention of hypoglycemia to exercise Also she will let us know if she is having excessive hyperglycemia in the next few days Continue regular exercise for weight loss  Hyperlipidemia: Her LDL particle number is as least and quite some time and overall lipids are well controlled, continue current regimen  Counseling time over 50% of today's 25 minute visit on various topics as above  Federick Levene

## 2016-03-20 ENCOUNTER — Encounter: Payer: Self-pay | Admitting: Cardiovascular Disease

## 2016-03-20 ENCOUNTER — Ambulatory Visit (INDEPENDENT_AMBULATORY_CARE_PROVIDER_SITE_OTHER): Payer: Medicare Other | Admitting: Cardiovascular Disease

## 2016-03-20 VITALS — BP 110/66 | HR 70 | Ht 69.5 in | Wt 201.0 lb

## 2016-03-20 DIAGNOSIS — E785 Hyperlipidemia, unspecified: Secondary | ICD-10-CM

## 2016-03-20 DIAGNOSIS — I2510 Atherosclerotic heart disease of native coronary artery without angina pectoris: Secondary | ICD-10-CM | POA: Diagnosis not present

## 2016-03-20 NOTE — Progress Notes (Signed)
[name] Date of Birth  05/13/1946 [place] [address]    [place]   [place], [place]  [address]    [place], [place]  [address] [phone]  Fax  [phone]  [phone]  Fax [phone]   Problems. 1. CAD 2. Atrial Flutter - s/p Aflutter ablation Nov. 2014.  3. Diabetes Mellitus.   History of Present Illness:  [name] is doing very well. She's not having episodes of chest pain or shortness of breath. She's been able to below of her normal activities without any significant problems. She has not had chest pains .  Her insurance will run out the month before she goes on Medicare.   She has not had any CP and no palpitations.    April 08, 2013:   [name] is doing well. No CP.  Rhythm is stable.     Mar 11, 2014:  [name] is doing well.  She had a atrial flutter ablation several months ago.  She has not been taking her metoprolol..  Nov. 9, 2015:  [name] is doing well. No Cp , no dyspnea.  Active, not exerciseing as much as she would like.  Mar 16, 2015:   [name] is doing well.   Doing well from a cardiac standpoint.   Nov. 10, 2016  Lots of stress - her 66 yo mom is living with her.  Will not let anyone else do anything Lots of stress No CP , no arrhythmias No time to exercise   Mar 20, 2016:  Doing well Her mother is living back in [place].   Stress is a bit less.  Exercising some .     Current Outpatient Prescriptions on File Prior to Visit  Medication Sig Dispense Refill  . aspirin 325 MG tablet Take 325 mg by mouth daily.    . Calcium Carbonate-Vitamin D (CALCIUM + D PO) Take 600 mg by mouth 2 (two) times daily.     . cholecalciferol (VITAMIN D) 1000 units tablet Take 1,000 Units by mouth daily.    . clopidogrel (PLAVIX) 75 MG tablet Take 1 tablet by mouth  daily 90 tablet 1  . doxycycline (VIBRAMYCIN) 100 MG capsule Take 100 mg by mouth daily.   11  . glucose blood (ONE TOUCH ULTRA TEST)  test strip Use as instructed to check blood sugar 6 times per day dx code E10.65 200 each 5  . HUMALOG 100 UNIT/ML injection USE MAX 200 UNITS PER DAY WITH INSULIN PUMP 90 mL 1  . ibuprofen (ADVIL,MOTRIN) 200 MG tablet Take 200 mg by mouth every 6 (six) hours as needed.    . Insulin Aspart (INSULIN PUMP) 100 unit/ml SOLN Inject into the skin. Novolog Vial, max 200 units per day with pump    . metFORMIN (GLUCOPHAGE-XR) 500 MG 24 hr tablet Take 2 tablets by mouth  daily 180 tablet 1  . metoprolol succinate (TOPROL-XL) 100 MG 24 hr tablet TAKE 1 TABLET (100 MG TOTAL) BY MOUTH DAILY. 90 tablet 3  . niacin (NIASPAN) 1000 MG CR tablet Take 1 tablet by mouth at  bedtime 90 tablet 1  . nitroGLYCERIN (NITROSTAT) 0.4 MG SL tablet Place 1 tablet (0.4 mg total) under the tongue every 5 (five) minutes as needed for chest pain. 25 tablet 6  . pioglitazone (ACTOS) 15 MG tablet Take 1 tablet (15 mg total) by mouth daily. 90 tablet 1  . rosuvastatin (CRESTOR) 40 MG  tablet Take 1 tablet (40 mg total) by mouth daily. 90 tablet 1  . tretinoin (RETIN-A) 0.1 % cream Apply 1 application topically at bedtime.   11   No current facility-administered medications on file prior to visit.    Allergies  Allergen Reactions  . Septra [Bactrim] Rash  . Sulfa Antibiotics Rash    Past Medical History  Diagnosis Date  . Coronary artery disease     2009 LAD Promus stent  . Hyperlipidemia   . Osteopenia   . Arthritis   . Type 1 diabetes mellitus on insulin therapy ([place])   . Fibroid   . Atrial flutter ([place])     a. s/p TEE/DCCV 08/16/13 (normal LV function, no LAA thrombus).b. s/p ablation by Dr Lovena Le 09-23-2013  . Valvular heart disease     a. Mild MR/TR by TEE 08/2013.    Past Surgical History  Procedure Laterality Date  . Coronary angioplasty with stent placement    . Pelvic laparoscopy    . Myomectomy  1977  . Appendectomy    . Cataract extraction Bilateral   . Tee without cardioversion N/A 08/16/2013     Procedure: TRANSESOPHAGEAL ECHOCARDIOGRAM (TEE);  Surgeon: Thayer Headings, MD;  Location: [place] [facility];  Service: Cardiovascular;  Laterality: N/A;  . Cardioversion N/A 08/16/2013    Procedure: CARDIOVERSION;  Surgeon: Thayer Headings, MD;  Location: [place];  Service: Cardiovascular;  Laterality: N/A;  spoke with [name]  . Ablation  09-23-2013    CTI by Dr Lovena Le  . Colonoscopy    . Atrial flutter ablation N/A 09/23/2013    Procedure: ATRIAL FLUTTER ABLATION;  Surgeon: Evans Lance, MD;  Location: [facility] CATH LAB;  Service: Cardiovascular;  Laterality: N/A;    History  Smoking status  . Never Smoker   Smokeless tobacco  . Never Used    History  Alcohol Use  . 1.2 oz/week  . 2 Glasses of wine per week    Family History  Problem Relation Age of Onset  . Atrial fibrillation Mother   . Diabetes Mother   . Hypertension Mother   . CAD Father 35    died age 33  . CAD Brother 19    small vessel disease  . CAD Cousin     paternal  . CAD Paternal Grandfather     early onset  . Colon cancer Neg Hx     Reviw of Systems:  Reviewed in the HPI.  All other systems are negative.  Physical Exam: Blood pressure 110/66, pulse 70, height 5' 9.5" (1.765 m), weight 201 lb (91.173 kg). General: Well developed, well nourished, in no acute distress.  Head: Normocephalic, atraumatic, sclera non-icteric, mucus membranes are moist,   Neck: Supple. Negative for carotid bruits. JVD not elevated.  Lungs: Clear bilaterally to auscultation without wheezes, rales, or rhonchi. Breathing is unlabored.  Heart: RRR with S1 S2. No murmurs, rubs, or gallops appreciated.  Abdomen: Soft, non-tender, non-distended with normoactive bowel sounds. No hepatomegaly. No rebound/guarding. No obvious abdominal masses.  Msk:  Strength and tone appear normal for age.  Extremities: No clubbing or cyanosis. No edema.  Distal pedal pulses are 2+ and equal bilaterally.  Neuro: Alert and oriented X 3. Moves all  extremities spontaneously.  Psych:  Responds to questions appropriately with a normal affect.  ECG:  Assessment / Plan:   1. CAD - doing well.  No angina . Continue current meds  2. Atrial Flutter - s/p Aflutter ablation Nov. 2014.  No  evidence of recurrence.    3. Diabetes Mellitus.  4. Hperlipidemia:  wil check fasting labs in 6 months at her next office visit.  Continue crestor. We speak spent about 15-20 minutes discussing regular exercise program. I have told her about the eye phone application- Map My Walk .   She seems diffuse yes take about getting this application. I suggested that she take a walk every morning and 9:00 which is the same time that she calls her mother. She should get a hands-free device which would make this a very easy task.   Mertie Moores, MD  03/20/2016 10:46 AM    [address],  [place], [place]  [address] Pager [phone] Phone: [phone]; Fax: [phone]   [facility]  [address] [place], [place]  [address] [phone]    Fax [phone]

## 2016-03-20 NOTE — Patient Instructions (Signed)

## 2016-04-09 ENCOUNTER — Other Ambulatory Visit: Payer: Self-pay | Admitting: Endocrinology

## 2016-04-09 ENCOUNTER — Ambulatory Visit (INDEPENDENT_AMBULATORY_CARE_PROVIDER_SITE_OTHER): Payer: Medicare Other | Admitting: Endocrinology

## 2016-04-09 ENCOUNTER — Encounter: Payer: Self-pay | Admitting: Endocrinology

## 2016-04-09 VITALS — BP 124/74 | HR 67 | Temp 98.0°F | Resp 14 | Ht 69.5 in | Wt 200.4 lb

## 2016-04-09 DIAGNOSIS — E1065 Type 1 diabetes mellitus with hyperglycemia: Secondary | ICD-10-CM

## 2016-04-09 DIAGNOSIS — I2510 Atherosclerotic heart disease of native coronary artery without angina pectoris: Secondary | ICD-10-CM

## 2016-04-09 NOTE — Progress Notes (Signed)
Patient ID: [name], female   DOB: November 19, 1945, 70 y.o.   MRN: [id number]   Reason for visit: DIABETES followup.  Diagnosis: Type 1 diabetes mellitus, date of diagnosis: 1979.    HISTORY of present illness:  Walk mid am    Insulin Pump: CURRENT brand: One Touch Ping  The pump settings are: Midnight = 0.8, 5 AM = 1.75, 8 AM = 1.65, 12 noon = 1.0; 5 PM = 2.1, 7 PM = 2.6 and 10 PM = 1.8.   Total daily insulin = 44.5 units, 80% basal   Carbohydrate ratio 1:15 breakfast, lunch and 1:8 at dinner; sensitivity 1: 35, 30 after 6 AM; target 120 +/-10  Insulin on board indicator on, duration 4 hrs   DIABETES: She has had long-standing diabetes with A1c levels usually above target. In 10/13 ACTOS was restarted because of her large insulin requirement and diabetic dyslipidemia.  Her blood sugars improved significantly reduced.  Also has been taking metformin as insulin sensitizer  RECENT history:  Her A1c on the last visit was 6.9 Her insulin pump had morning and evening times reversed.  She is here for short-term follow-up after making corrections on the time  Current blood sugar patterns from pump and Sensor download, management and problems identified:   OVERNIGHT glucose is markedly variable with several nights having higher blood sugars, at least 3 times from persistently high readings after evening meal.  Average blood sugar overnight on her fingersticks is 217  She has had a couple of nights with hypoglycemia and for that she does not take action except taking some glucose tablets and sometimes is sleeping through her alarm on the sensor  Occasionally she may have high readings overnight when she runs out of insulin and she has not changed her cartridge in time  Blood sugars around breakfast time or variable including mild low sugars at times  Blood sugars are tending to be low normal or low late morning and midday especially when she is exercising  More recently  with exercising she is not using a temporary basal to prevent hypoglycemia  Blood sugars are generally fairly good in the afternoons with some variability  She has several significant hyper glycemic episodes after evening meal, once when eating to conclude been treated tries to extend her boluses with the need a correction for postprandial hyperglycemia  Occasionally has had difficulties with inconsistent diet and variable blood sugars when [place]  She may not check her blood sugars or enter her her carbohydrate at lunchtime because of her busy schedule  COMPLIANCE with boluses has been fairly good although difficult to assess because she sometimes does not enter her carbohydrates at the time of her meals or snacks  Monitors blood glucose: on an average about 5 times a day according to pump download.  Blood Glucose readings on her monitor   AVERAGE glucose on fingersticks is 140+/-71 and a range of 49-334  Hypoglycemic awareness: Has symptoms of feeling tired, headache, sweaty. Variable recognition threshold present  Sometimes may not be aware during the night and will be notified by her sensor that the sugar is low  She has symptoms when blood glucose is less than 60. Uses glucose tablets for treatment when not at home but otherwise eats a snack .    DIET has been relatively better recently overall with lower carbohydrate intake  Again her mealtimes are variable. Frequently will not eat  breakfast.  Usually trying to have low fat meals  Food preferences: Yogurt And cottage cheese for breakfast usually.   Physical activity: exercise: She is trying to walk fairly often now and usually does that late morning up to 45 minutes Historically after exercise blood sugar will get lower in 1-2 hrs but sometimes it may get lower while walking and she carries glucose tablets with her. .  Certified Diabetes Educator visit: Most recent: 8/12.     Wt Readings from Last 3 Encounters:  04/09/16 200 lb 6.4 oz (90.901 kg)  03/20/16 201 lb (91.173 kg)  03/19/16 200 lb 12.8 oz (91.082 kg)    LABS:  Lab Results  Component Value Date   HGBA1C 6.9* 03/11/2016   HGBA1C 7.3* 12/19/2015   HGBA1C 6.7* 09/18/2015   Lab Results  Component Value Date   MICROALBUR <0.7 06/19/2015   LDLCALC 61 03/11/2016   CREATININE 0.72 03/11/2016    No visits with results within 1 Week(s) from this visit. Latest known visit with results is:  Lab on 03/11/2016  Component Date Value Ref Range Status  . Hgb A1c MFr Bld 03/11/2016 6.9* 4.6 - 6.5 % Final   Glycemic Control Guidelines for People with Diabetes:Non Diabetic:  <6%Goal of Therapy: <7%Additional Action Suggested:  >8%   . Sodium 03/11/2016 140  135 - 145 mEq/L Final  . Potassium 03/11/2016 4.6  3.5 - 5.1 mEq/L Final  . Chloride 03/11/2016 106  96 - 112 mEq/L Final  . CO2 03/11/2016 28  19 - 32 mEq/L Final  . Glucose, Bld 03/11/2016 42* 70 - 99 mg/dL Final  . BUN 03/11/2016 17  6 - 23 mg/dL Final  . Creatinine, Ser 03/11/2016 0.72  0.40 - 1.20 mg/dL Final  . Total Bilirubin 03/11/2016 0.4  0.2 - 1.2 mg/dL Final  . Alkaline Phosphatase 03/11/2016 94  39 - 117 U/L Final  . AST 03/11/2016 22  0 - 37 U/L Final  . ALT 03/11/2016 20  0 - 35 U/L Final  . Total Protein 03/11/2016 6.9  6.0 - 8.3 g/dL Final  . Albumin 03/11/2016 4.0  3.5 - 5.2 g/dL Final  . Calcium 03/11/2016 9.2  8.4 - 10.5 mg/dL Final  . GFR 03/11/2016 85.56  >60.00 mL/min Final  . LDL Particle Number 03/11/2016 [id number]  <1000 nmol/L Final   Comment:                           Low                   < 1000                           Moderate         1000 - 1299                           Borderline-High  1300 - 1599                           High             1600 - 2000                           Very High             >  2000   . HDL Particle Number 03/11/2016 34.4  >=30.5 umol/L Final  . Small LDL Particle Number 03/11/2016 [id number]   <=527 nmol/L Final  . LDL Size 03/11/2016 20.1  >20.5 nm Final   Comment:  ----------------------------------------------------------                  ** INTERPRETATIVE INFORMATION**                  PARTICLE CONCENTRATION AND SIZE                     <--Lower CVD Risk   Higher CVD Risk-->   LDL AND HDL PARTICLES   Percentile in Reference Population   HDL-P (total)        High     75th    50th    25th   Low                        >34.9    34.9    30.5    26.7   <26.7   Small LDL-P          Low      25th    50th    75th   High                        <117     117     527     839    >839   LDL Size   <-Large (Pattern A)->    <-Small (Pattern B)->                     23.0    20.6           20.5      19.0  ---------------------------------------------------------- Small LDL-P and LDL Size are associated with CVD risk, but not after LDL-P is taken into account. These assays were developed and their performance characteristics determined by [employer]. These assays have not been cleared by the [place] Food and Drug Administration. The clinical utility o                          f these laboratory values have not been fully established.   Marland Kitchen LP-IR Score 03/11/2016 26  <=45 Final   Comment: INSULIN RESISTANCE MARKER     <--Insulin Sensitive    Insulin Resistant-->            Percentile in Reference Population Insulin Resistance Score LP-IR Score   Low   25th   50th   75th   High               <27   27     45     63     >63 LP-IR Score is inaccurate if patient is non-fasting. The LP-IR score is a laboratory developed index that has been associated with insulin resistance and diabetes risk and should be used as one component of a physician's clinical assessment. The LP-IR score listed above has not been cleared by the [place] Food and Drug Administration.   . Cholesterol 03/11/2016 127  0 - 200 mg/dL Final   ATP III Classification       Desirable:  < 200 mg/dL               Borderline High:  200 -  239  mg/dL          High:  > = 240 mg/dL  . Triglycerides 03/11/2016 59.0  0.0 - 149.0 mg/dL Final   Normal:  <150 mg/dLBorderline High:  150 - 199 mg/dL  . HDL 03/11/2016 54.10  >39.00 mg/dL Final  . VLDL 03/11/2016 11.8  0.0 - 40.0 mg/dL Final  . LDL Cholesterol 03/11/2016 61  0 - 99 mg/dL Final  . Total CHOL/HDL Ratio 03/11/2016 2   Final                  Men          Women1/2 Average Risk     3.4          3.3Average Risk          5.0          4.42X Average Risk          9.6          7.13X Average Risk          15.0          11.0                      . NonHDL 03/11/2016 73.04   Final   NOTE:  Non-HDL goal should be 30 mg/dL higher than patient's LDL goal (i.e. LDL goal of < 70 mg/dL, would have non-HDL goal of < 100 mg/dL)      Medication List       This list is accurate as of: 04/09/16  9:14 PM.  Always use your most recent med list.               aspirin 325 MG tablet  Take 325 mg by mouth daily.     CALCIUM + D PO  Take 600 mg by mouth 2 (two) times daily.     cholecalciferol 1000 units tablet  Commonly known as:  VITAMIN D  Take 1,000 Units by mouth daily.     clopidogrel 75 MG tablet  Commonly known as:  PLAVIX  Take 1 tablet by mouth  daily     HUMALOG 100 UNIT/ML injection  Generic drug:  insulin lispro  USE MAX 200 UNITS PER DAY WITH INSULIN PUMP     ibuprofen 200 MG tablet  Commonly known as:  ADVIL,MOTRIN  Take 200 mg by mouth every 6 (six) hours as needed.     insulin pump Soln  Inject into the skin. Novolog Vial, max 200 units per day with pump     metFORMIN 500 MG 24 hr tablet  Commonly known as:  GLUCOPHAGE-XR  Take 2 tablets by mouth  daily     metoprolol succinate 100 MG 24 hr tablet  Commonly known as:  TOPROL-XL  TAKE 1 TABLET (100 MG TOTAL) BY MOUTH DAILY.     niacin 1000 MG CR tablet  Commonly known as:  NIASPAN  Take 1 tablet by mouth at  bedtime     nitroGLYCERIN 0.4 MG SL tablet  Commonly known as:  NITROSTAT  Place 1 tablet (0.4 mg  total) under the tongue every 5 (five) minutes as needed for chest pain.     ONE TOUCH ULTRA TEST test strip  Generic drug:  glucose blood  USE AS INSTRUCTED TO CHECK BLOOD SUGAR 6 TIMES DAILY     pioglitazone 15 MG tablet  Commonly known as:  ACTOS  Take 1 tablet (15 mg total) by mouth daily.  rosuvastatin 40 MG tablet  Commonly known as:  CRESTOR  Take 1 tablet (40 mg total) by mouth daily.     tretinoin 0.1 % cream  Commonly known as:  RETIN-A  Apply 1 application topically at bedtime.        Allergies:  Allergies  Allergen Reactions  . Septra [Bactrim] Rash  . Sulfa Antibiotics Rash    Past Medical History  Diagnosis Date  . Coronary artery disease     2009 LAD Promus stent  . Hyperlipidemia   . Osteopenia   . Arthritis   . Type 1 diabetes mellitus on insulin therapy ([place])   . Fibroid   . Atrial flutter ([place])     a. s/p TEE/DCCV 08/16/13 (normal LV function, no LAA thrombus).b. s/p ablation by Dr Lovena Le 09-23-2013  . Valvular heart disease     a. Mild MR/TR by TEE 08/2013.    Past Surgical History  Procedure Laterality Date  . Coronary angioplasty with stent placement    . Pelvic laparoscopy    . Myomectomy  1977  . Appendectomy    . Cataract extraction Bilateral   . Tee without cardioversion N/A 08/16/2013    Procedure: TRANSESOPHAGEAL ECHOCARDIOGRAM (TEE);  Surgeon: Thayer Headings, MD;  Location: [place];  Service: Cardiovascular;  Laterality: N/A;  . Cardioversion N/A 08/16/2013    Procedure: CARDIOVERSION;  Surgeon: Thayer Headings, MD;  Location: [place];  Service: Cardiovascular;  Laterality: N/A;  spoke with [name]  . Ablation  09-23-2013    CTI by Dr Lovena Le  . Colonoscopy    . Atrial flutter ablation N/A 09/23/2013    Procedure: ATRIAL FLUTTER ABLATION;  Surgeon: Evans Lance, MD;  Location: [facility] CATH LAB;  Service: Cardiovascular;  Laterality: N/A;    Family History  Problem Relation Age of Onset  . Atrial fibrillation Mother   .  Diabetes Mother   . Hypertension Mother   . CAD Father 66    died age 36  . CAD Brother 37    small vessel disease  . CAD Cousin     paternal  . CAD Paternal Grandfather     early onset  . Colon cancer Neg Hx     Social History:  reports that she has never smoked. She has never used smokeless tobacco. She reports that she drinks about 1.2 oz of alcohol per week. She reports that she does not use illicit drugs.  REVIEW of systems:   She has had diabetic dyslipidemia with significantly high LDL particle number and this has been managed with Crestor 40 mg and Niaspan 1000 mg In 3/16 her particle number was below 1000 and it is somewhat better Recently at 856 LDL is below 70, HDL excellent   Lab Results  Component Value Date   CHOL 127 03/11/2016   HDL 54.10 03/11/2016   LDLCALC 61 03/11/2016   LDLDIRECT 79.0 01/11/2015   TRIG 59.0 03/11/2016   CHOLHDL 2 03/11/2016   No significant hypertension and has had atrial arrhythmias treated with metoprolol    BP 124/74 mmHg  Pulse 67  Temp(Src) 98 F (36.7 C)  Resp 14  Ht 5' 9.5" (1.765 m)  Wt 200 lb 6.4 oz (90.901 kg)  BMI 29.18 kg/m2  SpO2 98%   ASSESSMENT/PLAN:  DIABETES type 1 with fair control, A1c is somewhat higher at 7.3 but previously has had more hypoglycemia when A1c was 6.7  See history of present illness for detailed discussion of her current blood sugar  patterns and management with her pump and interpretation of her continuous glucose sensor Her blood sugar control is Again very labile with periods of hyperglycemia and hypoglycemia Most of her hyperglycemia appears to be after her evening meals sometimes carrying on into the night especially with higher fat meals when she does not appear to take enough insulin for able to make adequate correction's  Hypoglycemia has also been significant at times especially overnight or with exercise She is not using temporary basal for exercise Also since she occasionally  has prolonged hypoglycemia in the night she is not able to treat this adequately with just glucose tablets  Recommendations:  Since blood sugars are fairly often low around lunchtime she will reduce her basal rate at 11 AM to 1.0  50% temporary basal rate for about 2 hours when she is starting exercise  Use a 5% basal rate for at least an hour when she gets significant hypoglycemia and particularly during the night  Extra insulin for higher fat meals over and above what she is doing now and may need to extend for longer periods of time  She will try to look into the new [employer] pump now to benefit from the closed loop system    Counseling time over 50% of today's 25 minute visit on various topics as above  Jermery Caratachea

## 2016-04-09 NOTE — Patient Instructions (Signed)
Temp basals as directed  More insulin than usual for hi fat or [place] meals

## 2016-04-17 ENCOUNTER — Other Ambulatory Visit: Payer: Self-pay | Admitting: Cardiovascular Disease

## 2016-05-01 ENCOUNTER — Encounter: Payer: Self-pay | Admitting: Internal Medicine

## 2016-05-09 ENCOUNTER — Other Ambulatory Visit: Payer: Self-pay | Admitting: Endocrinology

## 2016-05-09 ENCOUNTER — Other Ambulatory Visit (INDEPENDENT_AMBULATORY_CARE_PROVIDER_SITE_OTHER): Payer: Medicare Other

## 2016-05-09 DIAGNOSIS — E1065 Type 1 diabetes mellitus with hyperglycemia: Secondary | ICD-10-CM

## 2016-05-09 LAB — BASIC METABOLIC PANEL
BUN: 15 mg/dL (ref 6–23)
CALCIUM: 9.2 mg/dL (ref 8.4–10.5)
CO2: 27 mEq/L (ref 19–32)
Chloride: 106 mEq/L (ref 96–112)
Creatinine, Ser: 0.77 mg/dL (ref 0.40–1.20)
GFR: 79.14 mL/min (ref >60.00)
Glucose, Bld: 112 mg/dL — ABNORMAL HIGH (ref 70–99)
POTASSIUM: 4.1 meq/L (ref 3.5–5.1)
SODIUM: 139 meq/L (ref 135–145)

## 2016-05-09 LAB — MICROALBUMIN / CREATININE URINE RATIO
Creatinine,U: 116.2 mg/dL
MICROALB/CREAT RATIO: 0.6 mg/g (ref 0.0–30.0)

## 2016-05-10 LAB — C-PEPTIDE: C-Peptide: <0.1 ng/mL — ABNORMAL LOW (ref 1.1–4.4)

## 2016-05-14 ENCOUNTER — Telehealth: Payer: Self-pay | Admitting: Endocrinology

## 2016-05-14 NOTE — Telephone Encounter (Signed)
Please send the rx for the sensors to [facility]

## 2016-05-17 NOTE — Telephone Encounter (Signed)
I contacted [employer] and advised we have submitted a verbal order for the sensors. Pt advised we have contacted [employer] pt voiced understanding.

## 2016-05-21 ENCOUNTER — Other Ambulatory Visit: Payer: Self-pay | Admitting: Endocrinology

## 2016-05-28 ENCOUNTER — Telehealth: Payer: Self-pay | Admitting: Endocrinology

## 2016-05-28 NOTE — Telephone Encounter (Signed)
[employer] cmn form needs to be resubmitted # [phone]

## 2016-05-29 ENCOUNTER — Other Ambulatory Visit: Payer: Self-pay

## 2016-06-05 ENCOUNTER — Other Ambulatory Visit: Payer: Self-pay | Admitting: Endocrinology

## 2016-06-05 DIAGNOSIS — E1065 Type 1 diabetes mellitus with hyperglycemia: Secondary | ICD-10-CM

## 2016-06-06 ENCOUNTER — Other Ambulatory Visit: Payer: Medicare Other

## 2016-06-10 ENCOUNTER — Other Ambulatory Visit (INDEPENDENT_AMBULATORY_CARE_PROVIDER_SITE_OTHER): Payer: Medicare Other

## 2016-06-10 DIAGNOSIS — E1065 Type 1 diabetes mellitus with hyperglycemia: Secondary | ICD-10-CM

## 2016-06-10 LAB — BASIC METABOLIC PANEL
BUN: 13 mg/dL (ref 6–23)
CHLORIDE: 104 meq/L (ref 96–112)
CO2: 30 meq/L (ref 19–32)
Calcium: 9.5 mg/dL (ref 8.4–10.5)
Creatinine, Ser: 0.85 mg/dL (ref 0.40–1.20)
GFR: 70.59 mL/min (ref >60.00)
Glucose, Bld: 121 mg/dL — ABNORMAL HIGH (ref 70–99)
POTASSIUM: 4.2 meq/L (ref 3.5–5.1)
SODIUM: 139 meq/L (ref 135–145)

## 2016-06-10 LAB — HEMOGLOBIN A1C: HEMOGLOBIN A1C: 7 % — AB (ref 4.6–6.5)

## 2016-06-12 ENCOUNTER — Encounter: Payer: Self-pay | Admitting: Endocrinology

## 2016-06-12 ENCOUNTER — Ambulatory Visit (INDEPENDENT_AMBULATORY_CARE_PROVIDER_SITE_OTHER): Payer: Medicare Other | Admitting: Endocrinology

## 2016-06-12 VITALS — BP 122/62 | HR 72 | Ht 69.5 in | Wt 204.0 lb

## 2016-06-12 DIAGNOSIS — E1065 Type 1 diabetes mellitus with hyperglycemia: Secondary | ICD-10-CM

## 2016-06-12 DIAGNOSIS — I2510 Atherosclerotic heart disease of native coronary artery without angina pectoris: Secondary | ICD-10-CM

## 2016-06-12 NOTE — Progress Notes (Signed)
Patient ID: [name], female   DOB: 1946-03-17, 70 y.o.   MRN: [id number]   Reason for visit: DIABETES followup.  Diagnosis: Type 1 diabetes mellitus, date of diagnosis: 1979.    HISTORY of present illness:   Insulin Pump: CURRENT brand: One Touch Ping  The pump settings are: Midnight = 0.8, 5 AM = 1.75, 8 AM = 1.65, 11 AM = 1.0; 5 PM = 2.1, 7 PM = 2.6 and 10 PM = 1.8.   Total daily insulin = 44.5 units, 80% basal   Carbohydrate ratio 1:15 breakfast, lunch and 1:8 at dinner; sensitivity 1: 35, 30 after 6 AM; target 120 +/-10  Insulin on board indicator on, duration 4 hrs   DIABETES: She has had long-standing diabetes with A1c levels usually above target. In 10/13 ACTOS was restarted because of her large insulin requirement and diabetic dyslipidemia.  Her blood sugars improved significantly reduced.  Also has been taking metformin as insulin sensitizer  RECENT history:  Her A1c on the last visit was 6.9 and is now 7%  Current blood sugar patterns from pump and Sensor download, management and problems identified:   OVERNIGHT glucose is overall better but with variability.  Has had a few nights where there are slightly low early out of the night and some other nights they are significantly higher between 3 AM and waking up time  She is not having as much hypoglycemia compared to the last visit  However has not been exercising lately  She has several significant hyper glycemic episodes after lunch meal when she is eating out and not adding extra for higher fat or not able to count carbohydrates accurately  Evening meals are usually well covered with occasional exceptions when they are higher  Checking blood sugars mostly in the mornings and sporadically later in the day, not always before eating  She may not check her blood sugars or enter her her carbohydrate at lunchtime because of her busy schedule  COMPLIANCE with boluses has been fairly good although  difficult to assess because she sometimes does not enter her carbohydrates at the time of her meals or snacks  Monitors blood glucose: on an average about 5 times a day according to pump download.  Blood Glucose readings on her monitor   Mean values apply above for all meters except median for One Touch  PRE-MEAL Fasting Lunch Dinner Bedtime Overall  Glucose range: 95-300  97-342  2 6-262  67-234    Mean/median:     173    CONTINUOUS GLUCOSE MONITORING RECORD INTERPRETATION    Dates of Recording: 7/27 through 8/9  Sensor  summary:  Average blood glucose for the period of recording is 167 with standard deviation 80. CGM was active 67% of the time Blood sugars were above 180 37% of the time with readings above 250 about 16% of the time Blood sugars and target 55% the time and low 7.1% of the time with only 2% of readings in the range below 54      Glycemic patterns:  Hyperglycemic episodes Are mostly occurring after lunch and on occasion during the night and also sporadically late evening  Hypoglycemic episodes occurred mostly between midnight-3 AM and rarely right before midnight      Overnight periods:    Glucose readings are starting off averaging about 150 but occasionally has had low blood sugars until about 2-3 AM.  Has at least 3 episodes during the night when blood sugars were rising  after about 3 AM patient is significantly     Preprandial periods:  Blood sugars around breakfast time averaging 150, at lunchtime about 140-160 at supper time about 140      Postprandial periods:  Glucose readings are usually like to lower after breakfast without hypoglycemia.  They are variably high after lunch occasional significant hyperglycemia over 250 and about 2 of the evenings at least there were excessively high after evening meal      Hypoglycemia: Minimal and mostly occurring between midnight-3 AM and once or twice transiently at 6 PM, 9 PM and midnight         Hypoglycemic awareness:  Has symptoms of feeling tired, headache, sweaty. Variable recognition threshold present  Sometimes may not be aware during the night and will be notified by her sensor that the sugar is low  She has symptoms when blood glucose is less than 60. Uses glucose tablets for treatment when not at home but otherwise eats a snack .    DIET has been relatively better recently overall with lower carbohydrate intake  Again her mealtimes are variable. Frequently will not eat breakfast.  Usually trying to have low fat meals  Food preferences: Yogurt And cottage cheese for breakfast usually.   Physical activity: exercise: She is walking intermittently Historically after exercise blood sugar will get lower in 1-2 hrs but sometimes it may get lower while walking and she carries glucose tablets with her. .  Certified Diabetes Educator visit: Most recent: 8/12.    Wt Readings from Last 3 Encounters:  06/12/16 204 lb (92.5 kg)  04/09/16 200 lb 6.4 oz (90.9 kg)  03/20/16 201 lb (91.2 kg)    LABS:  Lab Results  Component Value Date   HGBA1C 7.0 (H) 06/10/2016   HGBA1C 6.9 (H) 03/11/2016   HGBA1C 7.3 (H) 12/19/2015   Lab Results  Component Value Date   MICROALBUR <0.7 05/09/2016   LDLCALC 61 03/11/2016   CREATININE 0.85 06/10/2016    Lab on 06/10/2016  Component Date Value Ref Range Status  . Hgb A1c MFr Bld 06/10/2016 7.0* 4.6 - 6.5 % Final  . Sodium 06/10/2016 139  135 - 145 mEq/L Final  . Potassium 06/10/2016 4.2  3.5 - 5.1 mEq/L Final  . Chloride 06/10/2016 104  96 - 112 mEq/L Final  . CO2 06/10/2016 30  19 - 32 mEq/L Final  . Glucose, Bld 06/10/2016 121* 70 - 99 mg/dL Final  . BUN 06/10/2016 13  6 - 23 mg/dL Final  . Creatinine, Ser 06/10/2016 0.85  0.40 - 1.20 mg/dL Final  . Calcium 06/10/2016 9.5  8.4 - 10.5 mg/dL Final  . GFR 06/10/2016 70.59  >60.00 mL/min Final      Medication List       Accurate as of 06/12/16  8:08 AM. Always use your most recent med list.            aspirin 325 MG tablet Take 325 mg by mouth daily.   CALCIUM + D PO Take 600 mg by mouth 2 (two) times daily.   cholecalciferol 1000 units tablet Commonly known as:  VITAMIN D Take 1,000 Units by mouth daily.   clopidogrel 75 MG tablet Commonly known as:  PLAVIX Take 1 tablet by mouth  daily   HUMALOG 100 UNIT/ML injection Generic drug:  insulin lispro USE MAX 200 UNITS PER DAY WITH INSULIN PUMP   ibuprofen 200 MG tablet Commonly known as:  ADVIL,MOTRIN Take 200 mg by mouth every 6 (six)  hours as needed.   insulin pump Soln Inject into the skin. Novolog Vial, max 200 units per day with pump   metFORMIN 500 MG 24 hr tablet Commonly known as:  GLUCOPHAGE-XR Take 2 tablets by mouth  daily   metoprolol succinate 100 MG 24 hr tablet Commonly known as:  TOPROL-XL TAKE 1 TABLET (100 MG TOTAL) BY MOUTH DAILY.   niacin 1000 MG CR tablet Commonly known as:  NIASPAN Take 1 tablet by mouth at  bedtime   nitroGLYCERIN 0.4 MG SL tablet Commonly known as:  NITROSTAT Place 1 tablet (0.4 mg total) under the tongue every 5 (five) minutes as needed for chest pain.   ONE TOUCH ULTRA TEST test strip Generic drug:  glucose blood USE AS INSTRUCTED TO CHECK BLOOD SUGAR 6 TIMES DAILY   pioglitazone 15 MG tablet Commonly known as:  ACTOS Take 1 tablet by mouth  daily   rosuvastatin 40 MG tablet Commonly known as:  CRESTOR Take 1 tablet by mouth  daily   tretinoin 0.1 % cream Commonly known as:  RETIN-A Apply 1 application topically at bedtime.       Allergies:  Allergies  Allergen Reactions  . Septra [Bactrim] Rash  . Sulfa Antibiotics Rash    Past Medical History:  Diagnosis Date  . Arthritis   . Atrial flutter ([place])    a. s/p TEE/DCCV 08/16/13 (normal LV function, no LAA thrombus).b. s/p ablation by Dr Lovena Le 09-23-2013  . Coronary artery disease    2009 LAD Promus stent  . Fibroid   . Hyperlipidemia   . Osteopenia   . Type 1 diabetes mellitus on insulin therapy  ([place])   . Valvular heart disease    a. Mild MR/TR by TEE 08/2013.    Past Surgical History:  Procedure Laterality Date  . ABLATION  09-23-2013   CTI by Dr Lovena Le  . APPENDECTOMY    . ATRIAL FLUTTER ABLATION N/A 09/23/2013   Procedure: ATRIAL FLUTTER ABLATION;  Surgeon: Evans Lance, MD;  Location: [facility] CATH LAB;  Service: Cardiovascular;  Laterality: N/A;  . CARDIOVERSION N/A 08/16/2013   Procedure: CARDIOVERSION;  Surgeon: Thayer Headings, MD;  Location: [place];  Service: Cardiovascular;  Laterality: N/A;  spoke with [name]  . CATARACT EXTRACTION Bilateral   . COLONOSCOPY    . CORONARY ANGIOPLASTY WITH STENT PLACEMENT    . MYOMECTOMY  1977  . PELVIC LAPAROSCOPY    . TEE WITHOUT CARDIOVERSION N/A 08/16/2013   Procedure: TRANSESOPHAGEAL ECHOCARDIOGRAM (TEE);  Surgeon: Thayer Headings, MD;  Location: [facility] ENDOSCOPY;  Service: Cardiovascular;  Laterality: N/A;    Family History  Problem Relation Age of Onset  . Atrial fibrillation Mother   . Diabetes Mother   . Hypertension Mother   . CAD Father 30    died age 73  . CAD Brother 79    small vessel disease  . CAD Cousin     paternal  . CAD Paternal Grandfather     early onset  . Colon cancer Neg Hx     Social History:  reports that she has never smoked. She has never used smokeless tobacco. She reports that she drinks about 1.2 oz of alcohol per week . She reports that she does not use drugs.  REVIEW of systems:   She has had diabetic dyslipidemia with significantly high LDL particle number and this has been managed with Crestor 40 mg and Niaspan 1000 mg In 3/16 her particle number was below 1000 and it is  somewhat better Recently at 856 LDL is below 70, HDL excellent   Lab Results  Component Value Date   CHOL 127 03/11/2016   HDL 54.10 03/11/2016   LDLCALC 61 03/11/2016   LDLDIRECT 79.0 01/11/2015   TRIG 59.0 03/11/2016   CHOLHDL 2 03/11/2016   She has had atrial arrhythmias treated with metoprolol    BP  122/62   Pulse 72   Ht 5' 9.5" (1.765 m)   Wt 204 lb (92.5 kg)   SpO2 96%   BMI 29.69 kg/m    ASSESSMENT/PLAN:  DIABETES type 1 with fair control, A1c is good at 7% considering difficulty getting consistent control  See history of present illness for detailed discussion of her current blood sugar patterns and management with her pump and interpretation of her continuous glucose sensor  Recording  Her blood sugar control is overall significantly better and less variable  She has had less hypoglycemia    Does have difficulty with postprandial hyperglycemia after lunch and occasionally after supper based on her ability to count carbohydrates or cover restaurant meals per Also not exercising regularly   Hypoglycemia : minimal and usually late at night without excessive bolusing  Occasional overnight hyperglycemia early morning  Recommendations:  50% temporary basal rate for  Up to2 hours when she is starting back on exercise   reduce to a 5% basal rate for at least an hour  If she gets significant hypoglycemia particularly during the night  Extra 2-4 units bolus insulin for higher fat meals At lunchtime over to cover the significant hyperglycemia that she is having after lunch   reduce basal rate between 10 PM and 2 AM down by 0.1 units   She can check to see if she has a new Medtronic  Sensor package   If she does she can start using the 635   consistent exercise   Stay on metformin and Actos    Counseling time over 50% of today's 25 minute visit on various topics as above  Allister Lessley

## 2016-06-12 NOTE — Patient Instructions (Signed)
Cut basal to -95% when low

## 2016-06-13 ENCOUNTER — Ambulatory Visit (INDEPENDENT_AMBULATORY_CARE_PROVIDER_SITE_OTHER): Payer: Medicare Other | Admitting: Internal Medicine

## 2016-06-13 ENCOUNTER — Encounter: Payer: Self-pay | Admitting: Internal Medicine

## 2016-06-13 DIAGNOSIS — Z7184 Encounter for health counseling related to travel: Secondary | ICD-10-CM | POA: Insufficient documentation

## 2016-06-13 DIAGNOSIS — Z23 Encounter for immunization: Secondary | ICD-10-CM | POA: Diagnosis not present

## 2016-06-13 DIAGNOSIS — Z7189 Other specified counseling: Secondary | ICD-10-CM

## 2016-06-13 DIAGNOSIS — I2510 Atherosclerotic heart disease of native coronary artery without angina pectoris: Secondary | ICD-10-CM | POA: Diagnosis not present

## 2016-06-13 MED ORDER — TYPHOID VACCINE PO CPDR: 1.0000 | DELAYED_RELEASE_CAPSULE | ORAL | 0 refills | Status: DC

## 2016-06-13 MED ORDER — CIPROFLOXACIN HCL 500 MG PO TABS: 500.0000 mg | ORAL_TABLET | Freq: Two times a day (BID) | ORAL | 0 refills | Status: DC

## 2016-06-13 MED ORDER — ATOVAQUONE-PROGUANIL HCL 250-100 MG PO TABS: 1.0000 | ORAL_TABLET | Freq: Every day | ORAL | 0 refills | Status: DC

## 2016-06-13 NOTE — Progress Notes (Signed)
Pre visit review using our clinic review tool, if applicable. No additional management support is needed unless otherwise documented below in the visit note. 

## 2016-06-13 NOTE — Patient Instructions (Signed)
We have sent in malarone (atovaqone) for the malaria prevention. Start taking it 2 days before leaving and continue during trip and 7 days after your return. Take 1 pill daily.   We have sent in ciprofloxacin in case you get a diarrheal illness while traveling. Take 1 pill twice a day for 5 days if you get symptoms.   We have also sent in the typhoid vaccine which is a pill. Take 1 pill every other day (4 pills) so that you finish taking it 1 week prior to travel (start about 2 weeks before travel).  CDC.gov travel destination [place] has information about packing lists and things to consider before leaving and while during travel to review.

## 2016-06-13 NOTE — Addendum Note (Signed)
Addended by: Resa Miner R on: 06/13/2016 04:16 PM   Modules accepted: Orders

## 2016-06-13 NOTE — Assessment & Plan Note (Addendum)
Reviewed CDC recommendations for travel to [place] with need for hepatitis A vaccine today (given hep a/b vaccine due to no stock of hep a alone). Oral typhoid vaccine rx given. Malaria ppx given with atovaquone-proguanil to start 2 days prior to travel and 1 week after return. Rx for ciprofloxacin in case of traveler's diarrhea. She had many questions about travel which were answered as well as risk of other illnesses with travel and recommendations on packing.

## 2016-06-13 NOTE — Progress Notes (Signed)
   Subjective:    Patient ID: [name], female    DOB: 02-Jan-1946, 70 y.o.   MRN: [id number]  HPI The patient is a 70 YO female coming in for travel advice. Going to several places in [place] on a 2 week cruise/land. They are staying on the boat some of the time. They are not adventurous eaters but do not know if they will be staying anywhere rural. She has not had hepatitis A vaccine in the past to her knowledge. She does think she got childhood vaccines. Allergy to sulfa medications. They are leaving end of September.   Review of Systems  Constitutional: Negative for activity change, appetite change, fatigue, fever and unexpected weight change.  HENT: Negative.   Eyes: Negative.   Respiratory: Negative for cough, chest tightness, shortness of breath and wheezing.   Cardiovascular: Negative for chest pain, palpitations and leg swelling.  Gastrointestinal: Negative for abdominal distention, abdominal pain, constipation, diarrhea and nausea.  Musculoskeletal: Negative.   Skin: Negative.   Neurological: Negative.   Psychiatric/Behavioral: Negative.       Objective:   Physical Exam  Constitutional: She is oriented to person, place, and time. She appears well-developed and well-nourished.  HENT:  Head: Normocephalic and atraumatic.  Eyes: EOM are normal.  Neck: Normal range of motion.  Cardiovascular: Normal rate and regular rhythm.   Pulmonary/Chest: Effort normal and breath sounds normal. No respiratory distress. She has no wheezes. She has no rales.  Abdominal: Soft. She exhibits no distension. There is no tenderness. There is no rebound.  Musculoskeletal: She exhibits no edema.  Neurological: She is alert and oriented to person, place, and time. Coordination normal.  Skin: Skin is warm and dry.   Vitals:   06/13/16 1007  BP: 108/78  Pulse: 68  Resp: 16  Temp: 97.8 F (36.6 C)  TempSrc: Oral  SpO2: 98%  Weight: 206 lb 6.4 oz (93.6 kg)  Height: 5' 9.5" (1.765 m)        Assessment & Plan:  Hepatitis A/B vaccine given at visit.

## 2016-06-17 ENCOUNTER — Telehealth: Payer: Self-pay | Admitting: Endocrinology

## 2016-06-17 NOTE — Telephone Encounter (Signed)
Attempted to reach the pt and advise we had received a form from [employer] and returned it with MD signature on 06/07/2016. Pt advised to call back and advise on what she needs from [place] on her insulin pump.

## 2016-06-17 NOTE — Telephone Encounter (Signed)
Patient has question about her insulin pump. She stated ed she only has three infusions sets left and don't know what to do. Please advise

## 2016-06-27 ENCOUNTER — Telehealth: Payer: Self-pay | Admitting: Endocrinology

## 2016-06-27 NOTE — Telephone Encounter (Signed)
[employer] rep called and said that they have received the CMN form for the PT but they need the office notes faxed over. Fax # [phone]

## 2016-06-27 NOTE — Telephone Encounter (Signed)
Last two office notes faxed to [facility].

## 2016-06-27 NOTE — Telephone Encounter (Signed)
Patient need you to call her about her Dex com CGM. She stated they need medical office notes to back up what she is doing concerning her pump. Please advise

## 2016-06-27 NOTE — Telephone Encounter (Signed)
Last two office notes sent per patients request and [employer] request.

## 2016-06-27 NOTE — Telephone Encounter (Signed)
Pt needs the test strips called in [facility] [address] for the medtronic insulin pump Testing 6 times daily

## 2016-06-28 ENCOUNTER — Other Ambulatory Visit: Payer: Self-pay

## 2016-06-28 MED ORDER — GLUCOSE BLOOD VI STRP: ORAL_STRIP | 5 refills | Status: DC

## 2016-06-28 NOTE — Telephone Encounter (Signed)
Sent in test strips, I will check with other nurse Megan to see about the status of dexacom.

## 2016-06-28 NOTE — Telephone Encounter (Signed)
Patient need a prescription for Bayer test strips   [id number] [place], [place] - [place] [id number] (Phone) [phone] (Fax)  She is out of test strips And checking on the stated of her Dexacom

## 2016-07-01 MED ORDER — GLUCOSE BLOOD VI STRP: ORAL_STRIP | 2 refills | Status: DC

## 2016-07-01 NOTE — Telephone Encounter (Signed)
Prescription for test strips sent

## 2016-07-02 NOTE — Telephone Encounter (Signed)
Pharmacy need a dx code for the Bayer test strips. [facility] - [place], [place] [phone] (Phone) [phone] (Fax)

## 2016-07-04 ENCOUNTER — Other Ambulatory Visit: Payer: Self-pay

## 2016-07-04 MED ORDER — GLUCOSE BLOOD VI STRP: ORAL_STRIP | 3 refills | Status: DC

## 2016-07-04 MED ORDER — GLUCOSE BLOOD VI STRP: ORAL_STRIP | 2 refills | Status: DC

## 2016-07-04 NOTE — Telephone Encounter (Signed)
pT NEEDED A DX CODE TO BE SENT IN WITH HER TEST STRIPS THAT WERE SENT RECENTLY AND WEANTED A 90 DAY SUPPLY. I PUT IN THE DX CODE AND SENT A RX FOR 550/3 REFILLS.

## 2016-07-18 ENCOUNTER — Ambulatory Visit: Payer: Medicare Other | Admitting: Internal Medicine

## 2016-07-18 ENCOUNTER — Ambulatory Visit (INDEPENDENT_AMBULATORY_CARE_PROVIDER_SITE_OTHER): Payer: Medicare Other

## 2016-07-18 DIAGNOSIS — Z23 Encounter for immunization: Secondary | ICD-10-CM

## 2016-08-06 ENCOUNTER — Other Ambulatory Visit: Payer: Self-pay | Admitting: Endocrinology

## 2016-08-08 ENCOUNTER — Other Ambulatory Visit: Payer: Self-pay

## 2016-08-08 MED ORDER — INSULIN LISPRO 100 UNIT/ML ~~LOC~~ SOLN: SUBCUTANEOUS | 1 refills | Status: DC

## 2016-08-09 ENCOUNTER — Other Ambulatory Visit: Payer: Self-pay | Admitting: *Deleted

## 2016-08-09 MED ORDER — INSULIN LISPRO 100 UNIT/ML ~~LOC~~ SOLN: SUBCUTANEOUS | 1 refills | Status: DC

## 2016-08-13 ENCOUNTER — Ambulatory Visit (INDEPENDENT_AMBULATORY_CARE_PROVIDER_SITE_OTHER): Payer: Medicare Other | Admitting: Adult Health

## 2016-08-13 ENCOUNTER — Encounter: Payer: Self-pay | Admitting: Adult Health

## 2016-08-13 VITALS — BP 120/52 | Temp 98.9°F | Ht 69.5 in | Wt 200.6 lb

## 2016-08-13 DIAGNOSIS — R310 Gross hematuria: Secondary | ICD-10-CM | POA: Diagnosis not present

## 2016-08-13 DIAGNOSIS — I2510 Atherosclerotic heart disease of native coronary artery without angina pectoris: Secondary | ICD-10-CM | POA: Diagnosis not present

## 2016-08-13 DIAGNOSIS — N3001 Acute cystitis with hematuria: Secondary | ICD-10-CM | POA: Diagnosis not present

## 2016-08-13 LAB — POCT URINALYSIS DIPSTICK
BILIRUBIN UA: NEGATIVE
GLUCOSE UA: NEGATIVE
Ketones, UA: NEGATIVE
NITRITE UA: POSITIVE
SPEC GRAV UA: 1.025
Urobilinogen, UA: 1
pH, UA: 6

## 2016-08-13 LAB — URINALYSIS, ROUTINE W REFLEX MICROSCOPIC
Bilirubin Urine: NEGATIVE
NITRITE: POSITIVE — AB
SPECIFIC GRAVITY, URINE: 1.015 (ref 1.000–1.030)
Total Protein, Urine: 100 — AB
UROBILINOGEN UA: 1 (ref 0.0–1.0)
Urine Glucose: NEGATIVE
pH: 7 (ref 5.0–8.0)

## 2016-08-13 LAB — BASIC METABOLIC PANEL
BUN: 11 mg/dL (ref 6–23)
CO2: 31 mEq/L (ref 19–32)
Calcium: 9.6 mg/dL (ref 8.4–10.5)
Chloride: 102 mEq/L (ref 96–112)
Creatinine, Ser: 0.85 mg/dL (ref 0.40–1.20)
GFR: 70.55 mL/min (ref >60.00)
GLUCOSE: 121 mg/dL — AB (ref 70–99)
POTASSIUM: 4.6 meq/L (ref 3.5–5.1)
SODIUM: 139 meq/L (ref 135–145)

## 2016-08-13 LAB — CBC WITH DIFFERENTIAL/PLATELET
BASOS ABS: 0 10*3/uL (ref 0.0–0.1)
Basophils Relative: 0.4 % (ref 0.0–3.0)
EOS ABS: 0.1 10*3/uL (ref 0.0–0.7)
EOS PCT: 1.5 % (ref 0.0–5.0)
HEMATOCRIT: 44.6 % (ref 36.0–46.0)
HEMOGLOBIN: 15.1 g/dL — AB (ref 12.0–15.0)
Lymphocytes Relative: 14.2 % (ref 12.0–46.0)
Lymphs Abs: 1.1 10*3/uL (ref 0.7–4.0)
MCHC: 33.8 g/dL (ref 30.0–36.0)
MCV: 91.1 fl (ref 78.0–100.0)
MONO ABS: 0.8 10*3/uL (ref 0.1–1.0)
MONOS PCT: 10.6 % (ref 3.0–12.0)
Neutro Abs: 5.8 10*3/uL (ref 1.4–7.7)
Neutrophils Relative %: 73.3 % (ref 43.0–77.0)
Platelets: 235 10*3/uL (ref 150.0–400.0)
RBC: 4.9 Mil/uL (ref 3.87–5.11)
RDW: 13 % (ref 11.5–15.5)
WBC: 8 10*3/uL (ref 4.0–10.5)

## 2016-08-13 MED ORDER — CIPROFLOXACIN HCL 500 MG PO TABS: 500.0000 mg | ORAL_TABLET | Freq: Two times a day (BID) | ORAL | 0 refills | Status: DC

## 2016-08-13 NOTE — Patient Instructions (Signed)
It was great seeing you today!  I have sent in a prescription for Cipro for the UTI. Take this twice a day until finished.   Someone will call yo to set up the cat scan   Follow up with Dr. [name].

## 2016-08-13 NOTE — Progress Notes (Signed)
Subjective:    Patient ID: [name], female    DOB: Jul 21, 1946, 70 y.o.   MRN: [id number]  HPI  70 year old female, patient of Dr. [name] who presents to the office today today for the complaint of a UTI. She reports that her symptoms started yesterday. Her symptoms include: dysuria, hematuria and generalized fatigue. She reports that the hematuria started yesterday night and continued today.    She does not get UTI's often.   Has not been using any OTC medications for her symptoms.    Review of Systems  Constitutional: Negative.  Negative for chills, diaphoresis and fatigue.  HENT: Negative.   Genitourinary: Positive for dysuria, frequency, hematuria and urgency.  Neurological: Negative.   All other systems reviewed and are negative.  Past Medical History:  Diagnosis Date  . Arthritis   . Atrial flutter ([place])    a. s/p TEE/DCCV 08/16/13 (normal LV function, no LAA thrombus).b. s/p ablation by Dr Lovena Le 09-23-2013  . Coronary artery disease    2009 LAD Promus stent  . Fibroid   . Hyperlipidemia   . Osteopenia   . Type 1 diabetes mellitus on insulin therapy ([place])   . Valvular heart disease    a. Mild MR/TR by TEE 08/2013.    Social History   Social History  . Marital status: Married    Spouse name: N/A  . Number of children: 2  . 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 Smoking status: Never Smoker  . Smokeless tobacco: Never Used  . Alcohol use 1.2 oz/week    2 Glasses of wine per week  . Drug use: No  . Sexual activity: Yes    Birth control/ protection: Post-menopausal   Other Topics Concern  . Not on file   Social History Narrative  . No narrative on file    Past Surgical History:  Procedure Laterality Date  . ABLATION  09-23-2013   CTI by Dr Lovena Le  . APPENDECTOMY    . ATRIAL FLUTTER ABLATION N/A 09/23/2013   Procedure: ATRIAL FLUTTER ABLATION;  Surgeon: Evans Lance, MD;  Location: [facility] CATH LAB;  Service: Cardiovascular;  Laterality: N/A;  . CARDIOVERSION N/A 08/16/2013   Procedure: CARDIOVERSION;  Surgeon: Thayer Headings, MD;  Location: [place];  Service: Cardiovascular;  Laterality: N/A;  spoke with [name]  . CATARACT EXTRACTION Bilateral   . COLONOSCOPY    . CORONARY ANGIOPLASTY WITH STENT PLACEMENT    . MYOMECTOMY  1977  . PELVIC LAPAROSCOPY    . TEE WITHOUT CARDIOVERSION N/A 08/16/2013   Procedure: TRANSESOPHAGEAL ECHOCARDIOGRAM (TEE);  Surgeon: Thayer Headings, MD;  Location: [facility] ENDOSCOPY;  Service: Cardiovascular;  Laterality: N/A;    Family History  Problem Relation Age of Onset  . CAD Father 73    died age 62  . CAD Brother 73    small vessel disease  . Atrial fibrillation Mother   . Diabetes Mother   . Hypertension Mother   . CAD Cousin     paternal  . CAD Paternal Grandfather     early onset  . Colon cancer Neg Hx     Allergies  Allergen Reactions  . Septra [Bactrim] Rash  . Sulfa Antibiotics Rash    Current Outpatient Prescriptions on File Prior to Visit  Medication Sig Dispense Refill  . aspirin 325 MG tablet Take 325 mg by  mouth daily.    Marland Kitchen atovaquone-proguanil (MALARONE) 250-100 MG TABS tablet Take 1 tablet by mouth daily. Start 2 days before travel, continue for 7 days after return. 40 tablet 0  . Calcium Carbonate-Vitamin D (CALCIUM + D PO) Take 600 mg by mouth 2 (two) times daily.     . cholecalciferol (VITAMIN D) 1000 units tablet Take 1,000 Units by mouth daily.    . ciprofloxacin (CIPRO) 500 MG tablet Take 1 tablet (500 mg total) by mouth 2 (two) times daily. 10 tablet 0  . clopidogrel (PLAVIX) 75 MG tablet Take 1 tablet by mouth  daily 90 tablet 0  . glucose blood (BAYER CONTOUR NEXT TEST) test strip Use to check blood sugar 6 times per day. 200 each 2  . glucose blood (BAYER CONTOUR TEST) test strip Use as instructed to check sugar 6 times daily. Dx Code E10.65 550 each 3  . ibuprofen (ADVIL,MOTRIN) 200  MG tablet Take 200 mg by mouth every 6 (six) hours as needed.    . Insulin Aspart (INSULIN PUMP) 100 unit/ml SOLN Inject into the skin. Novolog Vial, max 200 units per day with pump    . insulin lispro (HUMALOG) 100 UNIT/ML injection USE MAX 200 UNITS PER DAY WITH INSULIN PUMP 90 mL 1  . metFORMIN (GLUCOPHAGE-XR) 500 MG 24 hr tablet Take 2 tablets by mouth  daily 180 tablet 1  . metoprolol succinate (TOPROL-XL) 100 MG 24 hr tablet TAKE 1 TABLET (100 MG TOTAL) BY MOUTH DAILY. 90 tablet 3  . niacin (NIASPAN) 1000 MG CR tablet Take 1 tablet by mouth at  bedtime (Patient taking differently: 1,000 mg. Take 1 tablet by mouth at  bedtime) 90 tablet 1  . nitroGLYCERIN (NITROSTAT) 0.4 MG SL tablet Place 1 tablet (0.4 mg total) under the tongue every 5 (five) minutes as needed for chest pain. 25 tablet 6  . pioglitazone (ACTOS) 15 MG tablet Take 1 tablet by mouth  daily 90 tablet 0  . rosuvastatin (CRESTOR) 40 MG tablet Take 1 tablet by mouth  daily 90 tablet 0  . tretinoin (RETIN-A) 0.1 % cream Apply 1 application topically at bedtime.   11  . typhoid (VIVOTIF) DR capsule Take 1 capsule by mouth every other day. 4 capsule 0   No current facility-administered medications on file prior to visit.     BP (!) 120/52   Temp 98.9 F (37.2 C) (Oral)   Ht 5' 9.5" (1.765 m)   Wt 200 lb 9.6 oz (91 kg)   BMI 29.20 kg/m       Objective:   Physical Exam  Constitutional: She is oriented to person, place, and time. She appears well-developed and well-nourished. No distress.  Cardiovascular: Normal rate, regular rhythm, normal heart sounds and intact distal pulses.  Exam reveals no gallop and no friction rub.   No murmur heard. Pulmonary/Chest: Effort normal and breath sounds normal. No respiratory distress. She has no wheezes. She has no rales. She exhibits no tenderness.  Abdominal: Soft. Normal appearance, normal aorta and bowel sounds are normal. She exhibits no distension and no mass. There is no  tenderness. There is no rebound, no guarding and no CVA tenderness.  Musculoskeletal: Normal range of motion. She exhibits no edema, tenderness or deformity.  Neurological: She is alert and oriented to person, place, and time.  Skin: Skin is warm and dry. No rash noted. She is not diaphoretic. No erythema. No pallor.  Psychiatric: She has a normal mood and affect. Her behavior  is normal. Judgment and thought content normal.  Nursing note and vitals reviewed.      Assessment & Plan:  1. Acute cystitis with hematuria - Urine culture - POCT urinalysis dipstick - Urinalysis with Reflex Microscopic - ciprofloxacin (CIPRO) 500 MG tablet; Take 1 tablet (500 mg total) by mouth 2 (two) times daily.  Dispense: 10 tablet; Refill: 0  2. Gross hematuria - Will send for CT to r/o malignancy  - Basic metabolic panel - CBC with Differential/Platelet - CT ABDOMEN PELVIS W WO CONTRAST; Future - Follow up with Dr. [name] as needed  Dorothyann Peng, NP

## 2016-08-13 NOTE — Addendum Note (Signed)
Addended by: Gari Crown D on: 08/13/2016 03:28 PM   Modules accepted: Orders

## 2016-08-15 LAB — URINE CULTURE

## 2016-08-16 ENCOUNTER — Telehealth: Payer: Self-pay | Admitting: Adult Health

## 2016-08-16 ENCOUNTER — Ambulatory Visit
Admission: RE | Admit: 2016-08-16 | Discharge: 2016-08-16 | Disposition: A | Discharge status: Home / Self Care | Payer: Medicare Other | Source: Ambulatory Visit | Attending: Adult Health | Admitting: Adult Health

## 2016-08-16 DIAGNOSIS — R310 Gross hematuria: Secondary | ICD-10-CM

## 2016-08-16 MED ORDER — IOPAMIDOL (ISOVUE-300) INJECTION 61%
125.0000 mL | Freq: Once | INTRAVENOUS | Status: AC | PRN
  Administered 2016-08-16: 125 mL via INTRAVENOUS

## 2016-08-16 NOTE — Telephone Encounter (Signed)
Updated patient on her CT results. She reports that she is feeling better and that the gross hematuria has resolved. Advised to keep appointment with urology

## 2016-08-27 ENCOUNTER — Ambulatory Visit (INDEPENDENT_AMBULATORY_CARE_PROVIDER_SITE_OTHER): Payer: Medicare Other | Admitting: Adult Health

## 2016-08-27 ENCOUNTER — Encounter: Payer: Self-pay | Admitting: Adult Health

## 2016-08-27 VITALS — BP 122/60 | Temp 98.6°F | Ht 69.5 in | Wt 201.0 lb

## 2016-08-27 DIAGNOSIS — I2510 Atherosclerotic heart disease of native coronary artery without angina pectoris: Secondary | ICD-10-CM | POA: Diagnosis not present

## 2016-08-27 DIAGNOSIS — R310 Gross hematuria: Secondary | ICD-10-CM | POA: Diagnosis not present

## 2016-08-27 DIAGNOSIS — R300 Dysuria: Secondary | ICD-10-CM | POA: Diagnosis not present

## 2016-08-27 DIAGNOSIS — N3001 Acute cystitis with hematuria: Secondary | ICD-10-CM

## 2016-08-27 LAB — POC URINALSYSI DIPSTICK (AUTOMATED)
BILIRUBIN UA: NEGATIVE
GLUCOSE UA: NEGATIVE
Ketones, UA: NEGATIVE
Nitrite, UA: NEGATIVE
SPEC GRAV UA: 1.015
UROBILINOGEN UA: 0.2
pH, UA: 6.5

## 2016-08-27 MED ORDER — LEVOFLOXACIN 750 MG PO TABS: 750.0000 mg | ORAL_TABLET | Freq: Every day | ORAL | 0 refills | Status: DC

## 2016-08-27 NOTE — Patient Instructions (Signed)
It was great seeing you today!  I have put in the referral to Urology, please let me know if you do not hear from them  I have sent in a prescription for Levaquin, take this daily for 5 days.   Follow up as needed

## 2016-08-27 NOTE — Progress Notes (Signed)
Subjective:    Patient ID: [name], female    DOB: Nov 10, 1945, 70 y.o.   MRN: [id number]  HPI  70 year old female who presents to the office today for continued UTI type symptoms and reported hematuria. . I last saw her two weeks ago for this issue.  She was prescribed Cipro which she reports worked well and her symptoms resolved.   Today she returns to the clinic due to her symptoms returning. Her symptoms this time include gross hematuria( with clots), dysuria, frequency and urgency. The hematuria " comes and goes" but this morning she noticed clots which have since resolved.   She denies any CVA pain, abdominal pain or feeling acutely ill  CT scan was negative from last week.  IMPRESSION: 1. No CT findings to explain the patient's history of UTI and gross hematuria. 2. Abdominal aortic atherosclerosis. 3. Fibroid changes noted in the uterus.  Review of Systems  Constitutional: Negative.   Respiratory: Negative.   Cardiovascular: Negative.   Genitourinary: Positive for dysuria, frequency, hematuria and urgency. Negative for difficulty urinating, flank pain, pelvic pain, vaginal bleeding, vaginal discharge and vaginal pain.  Neurological: Negative.   All other systems reviewed and are negative.  Past Medical History:  Diagnosis Date  . Arthritis   . Atrial flutter ([place])    a. s/p TEE/DCCV 08/16/13 (normal LV function, no LAA thrombus).b. s/p ablation by Dr Lovena Le 09-23-2013  . Coronary artery disease    2009 LAD Promus stent  . Fibroid   . Hyperlipidemia   . Osteopenia   . Type 1 diabetes mellitus on insulin therapy ([place])   . Valvular heart disease    a. Mild MR/TR by TEE 08/2013.    Social History   Social History  . Marital status: Married    Spouse name: N/A  . Number of children: 2  . 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 Smoking status: Never Smoker  . Smokeless tobacco:  Never Used  . Alcohol use 1.2 oz/week    2 Glasses of wine per week  . Drug use: No  . Sexual activity: Yes    Birth control/ protection: Post-menopausal   Other Topics Concern  . Not on file   Social History Narrative  . No narrative on file    Past Surgical History:  Procedure Laterality Date  . ABLATION  09-23-2013   CTI by Dr Lovena Le  . APPENDECTOMY    . ATRIAL FLUTTER ABLATION N/A 09/23/2013   Procedure: ATRIAL FLUTTER ABLATION;  Surgeon: Evans Lance, MD;  Location: [facility] C[facility] LAB;  Service: Cardiovascular;  Laterality: N/A;  . CARDIOVERSION N/A 08/16/2013   Procedure: CARDIOVERSION;  Surgeon: Thayer Headings, MD;  Location: [place];  Service: Cardiovascular;  Laterality: N/A;  spoke with [name]  . CATARACT EXTRACTION Bilateral   . COLONOSCOPY    . CORONARY ANGIOPLASTY WITH STENT PLACEMENT    . MYOMECTOMY  1977  . PELVIC LAPAROSCOPY    . TEE WITHOUT CARDIOVERSION N/A 08/16/2013   Procedure: TRANSESOPHAGEAL ECHOCARDIOGRAM (TEE);  Surgeon: Thayer Headings, MD;  Location: [facility] ENDOSCOPY;  Service: Cardiovascular;  Laterality: N/A;    Family History  Problem Relation Age of Onset  . CAD Father 42    died age 42  . CAD Brother 32    small vessel disease  . Atrial fibrillation Mother   . Diabetes Mother   .  Hypertension Mother   . CAD Cousin     paternal  . CAD Paternal Grandfather     early onset  . Colon cancer Neg Hx     Allergies  Allergen Reactions  . Septra [Bactrim] Rash  . Sulfa Antibiotics Rash    Current Outpatient Prescriptions on File Prior to Visit  Medication Sig Dispense Refill  . aspirin 325 MG tablet Take 325 mg by mouth daily.    Marland Kitchen atovaquone-proguanil (MALARONE) 250-100 MG TABS tablet Take 1 tablet by mouth daily. Start 2 days before travel, continue for 7 days after return. 40 tablet 0  . Calcium Carbonate-Vitamin D (CALCIUM + D PO) Take 600 mg by mouth 2 (two) times daily.     . cholecalciferol (VITAMIN D) 1000 units tablet Take 1,000  Units by mouth daily.    . clopidogrel (PLAVIX) 75 MG tablet Take 1 tablet by mouth  daily 90 tablet 0  . glucose blood (BAYER CONTOUR NEXT TEST) test strip Use to check blood sugar 6 times per day. 200 each 2  . glucose blood (BAYER CONTOUR TEST) test strip Use as instructed to check sugar 6 times daily. Dx Code E10.65 550 each 3  . ibuprofen (ADVIL,MOTRIN) 200 MG tablet Take 200 mg by mouth every 6 (six) hours as needed.    . Insulin Aspart (INSULIN PUMP) 100 unit/ml SOLN Inject into the skin. Novolog Vial, max 200 units per day with pump    . insulin lispro (HUMALOG) 100 UNIT/ML injection USE MAX 200 UNITS PER DAY WITH INSULIN PUMP 90 mL 1  . metFORMIN (GLUCOPHAGE-XR) 500 MG 24 hr tablet Take 2 tablets by mouth  daily 180 tablet 1  . metoprolol succinate (TOPROL-XL) 100 MG 24 hr tablet TAKE 1 TABLET (100 MG TOTAL) BY MOUTH DAILY. 90 tablet 3  . niacin (NIASPAN) 1000 MG CR tablet Take 1 tablet by mouth at  bedtime (Patient taking differently: 1,000 mg. Take 1 tablet by mouth at  bedtime) 90 tablet 1  . nitroGLYCERIN (NITROSTAT) 0.4 MG SL tablet Place 1 tablet (0.4 mg total) under the tongue every 5 (five) minutes as needed for chest pain. 25 tablet 6  . pioglitazone (ACTOS) 15 MG tablet Take 1 tablet by mouth  daily 90 tablet 0  . rosuvastatin (CRESTOR) 40 MG tablet Take 1 tablet by mouth  daily 90 tablet 0  . tretinoin (RETIN-A) 0.1 % cream Apply 1 application topically at bedtime.   11  . typhoid (VIVOTIF) DR capsule Take 1 capsule by mouth every other day. 4 capsule 0   No current facility-administered medications on file prior to visit.     BP 122/60   Temp 98.6 F (37 C) (Oral)   Ht 5' 9.5" (1.765 m)   Wt 201 lb (91.2 kg)   BMI 29.26 kg/m       Objective:   Physical Exam  Constitutional: She is oriented to person, place, and time. She appears well-developed and well-nourished. No distress.  Cardiovascular: Normal rate, regular rhythm and intact distal pulses.  Exam reveals no  gallop and no friction rub.   No murmur heard. Pulmonary/Chest: Effort normal and breath sounds normal. No respiratory distress. She has no wheezes. She has no rales. She exhibits no tenderness.  Abdominal: Soft. Normal appearance, normal aorta and bowel sounds are normal. There is no splenomegaly or hepatomegaly. There is no tenderness. There is no CVA tenderness.  No bruising noted  Musculoskeletal: Normal range of motion. She exhibits no edema, tenderness  or deformity.  Neurological: She is alert and oriented to person, place, and time.  Skin: Skin is warm and dry. No rash noted. She is not diaphoretic. No erythema. No pallor.  Psychiatric: She has a normal mood and affect. Her behavior is normal. Judgment and thought content normal.  Nursing note and vitals reviewed.     Assessment & Plan:  1. Acute cystitis with hematuria - POCT Urinalysis Dipstick (Automated) - Urine culture - levofloxacin (LEVAQUIN) 750 MG tablet; Take 1 tablet (750 mg total) by mouth daily.  Dispense: 5 tablet; Refill: 0 - Follow up as needed  2. Gross hematuria - Ambulatory referral to Urology  Dorothyann Peng, NP

## 2016-08-29 LAB — URINE CULTURE: ORGANISM ID, BACTERIA: NO GROWTH

## 2016-09-03 ENCOUNTER — Telehealth: Payer: Self-pay | Admitting: Internal Medicine

## 2016-09-03 NOTE — Telephone Encounter (Signed)
Pt is returning brooke call

## 2016-09-03 NOTE — Telephone Encounter (Signed)
Patient notified of lab results

## 2016-09-09 ENCOUNTER — Other Ambulatory Visit (INDEPENDENT_AMBULATORY_CARE_PROVIDER_SITE_OTHER): Payer: Medicare Other

## 2016-09-09 ENCOUNTER — Telehealth: Payer: Self-pay | Admitting: Endocrinology

## 2016-09-09 ENCOUNTER — Other Ambulatory Visit: Payer: Self-pay

## 2016-09-09 DIAGNOSIS — E1065 Type 1 diabetes mellitus with hyperglycemia: Secondary | ICD-10-CM | POA: Diagnosis not present

## 2016-09-09 LAB — LIPID PANEL
CHOLESTEROL: 130 mg/dL (ref 0–200)
HDL: 54.9 mg/dL (ref >39.00)
LDL Cholesterol: 62 mg/dL (ref 0–99)
NonHDL: 74.6
TRIGLYCERIDES: 65 mg/dL (ref 0.0–149.0)
Total CHOL/HDL Ratio: 2
VLDL: 13 mg/dL (ref 0.0–40.0)

## 2016-09-09 LAB — COMPREHENSIVE METABOLIC PANEL
ALBUMIN: 3.8 g/dL (ref 3.5–5.2)
ALK PHOS: 83 U/L (ref 39–117)
ALT: 18 U/L (ref 0–35)
AST: 19 U/L (ref 0–37)
BUN: 17 mg/dL (ref 6–23)
CHLORIDE: 104 meq/L (ref 96–112)
CO2: 30 mEq/L (ref 19–32)
Calcium: 10 mg/dL (ref 8.4–10.5)
Creatinine, Ser: 0.85 mg/dL (ref 0.40–1.20)
GFR: 70.54 mL/min (ref >60.00)
Glucose, Bld: 103 mg/dL — ABNORMAL HIGH (ref 70–99)
POTASSIUM: 4.3 meq/L (ref 3.5–5.1)
Sodium: 139 mEq/L (ref 135–145)
TOTAL PROTEIN: 6.4 g/dL (ref 6.0–8.3)
Total Bilirubin: 0.5 mg/dL (ref 0.2–1.2)

## 2016-09-09 LAB — HEMOGLOBIN A1C: Hgb A1c MFr Bld: 7.1 % — ABNORMAL HIGH (ref 4.6–6.5)

## 2016-09-09 MED ORDER — CLOPIDOGREL BISULFATE 75 MG PO TABS: 75.0000 mg | ORAL_TABLET | Freq: Every day | ORAL | 0 refills | Status: DC

## 2016-09-09 MED ORDER — NIACIN ER (ANTIHYPERLIPIDEMIC) 1000 MG PO TBCR: EXTENDED_RELEASE_TABLET | ORAL | 1 refills | Status: DC

## 2016-09-09 MED ORDER — PIOGLITAZONE HCL 15 MG PO TABS: 15.0000 mg | ORAL_TABLET | Freq: Every day | ORAL | 0 refills | Status: DC

## 2016-09-09 MED ORDER — ROSUVASTATIN CALCIUM 40 MG PO TABS: 40.0000 mg | ORAL_TABLET | Freq: Every day | ORAL | 0 refills | Status: DC

## 2016-09-12 ENCOUNTER — Ambulatory Visit: Payer: Medicare Other | Admitting: Endocrinology

## 2016-09-18 ENCOUNTER — Ambulatory Visit (INDEPENDENT_AMBULATORY_CARE_PROVIDER_SITE_OTHER): Payer: Medicare Other | Admitting: Endocrinology

## 2016-09-18 ENCOUNTER — Encounter: Payer: Self-pay | Admitting: Endocrinology

## 2016-09-18 VITALS — BP 112/60 | HR 69 | Ht 70.0 in | Wt 201.0 lb

## 2016-09-18 DIAGNOSIS — E782 Mixed hyperlipidemia: Secondary | ICD-10-CM

## 2016-09-18 DIAGNOSIS — N390 Urinary tract infection, site not specified: Secondary | ICD-10-CM | POA: Diagnosis not present

## 2016-09-18 DIAGNOSIS — I2510 Atherosclerotic heart disease of native coronary artery without angina pectoris: Secondary | ICD-10-CM | POA: Diagnosis not present

## 2016-09-18 DIAGNOSIS — R310 Gross hematuria: Secondary | ICD-10-CM | POA: Diagnosis not present

## 2016-09-18 DIAGNOSIS — E1065 Type 1 diabetes mellitus with hyperglycemia: Secondary | ICD-10-CM | POA: Diagnosis not present

## 2016-09-18 NOTE — Progress Notes (Signed)
Patient ID: [name], female   DOB: [birth year], 70 y.o.   MRN: [id number]   Reason for visit: DIABETES followup.  Diagnosis: Type 1 diabetes mellitus, date of diagnosis: 1979.    HISTORY of present illness:   Insulin Pump: CURRENT brand: 630  The pump settings are: Midnight = 0.8, 5 AM = 1.75, 8 AM = 1.65, 11 AM = 1.0; 5 PM = 2.1, 7 PM = 2.6 and 10 PM = 1.8.   Total daily insulin = 44.5 units, 80% basal   Carbohydrate ratio 1:15 breakfast, lunch and 1:8 at dinner; sensitivity 1: 35, 30 after 6 AM; target 120 +/-10  Insulin on board indicator on, duration 4 hrs   DIABETES: She has had long-standing diabetes with A1c levels usually above target. In 10/13 ACTOS was restarted because of her large insulin requirement and diabetic dyslipidemia.  Her blood sugars improved significantly reduced.  Also has been taking metformin as insulin sensitizer  RECENT history:  Her A1c on the last visit was 6.9 and is now 7%  Current blood sugar patterns from pump and Sensor download, management and problems identified:   OVERNIGHT glucose is more consistently low recently has seen only continuous monioring  However she has documented only one low blood Sugar at 3:30 AM   Not clear why her sugars have been significantly variabe over the last 2 weeks; she did have recently low normal or low readings since at least Friday and before that she was having high readings in the late mornings and midday  She thinks that part of her variability is related to her taking care of her mother out of town and other stresses  Not exercising because of family issues  Recently not always eating lunch and may only have snacks, sometimes will not bolus his blood sugar has been low normal at lunch  She appears to have declined in blood sugars significantly after 10 PM or so 1 her CGM  AVERAGE blood sugar on the day she has used the CGM is 106, has used this only about half the time in the last 13  days  COMPLIANCE with boluses has been fairly good although difficult to assess because she sometimes does not enter her carbohydrates at the time of her meals or snacks  Monitors blood glucose: on an average 4-5 times a day according to pump download.  Blood Glucose readings on her monitor   Mean values apply above for all meters except median for One Touch  PRE-MEAL Fasting Lunch Dinner Bedtime Overall  Glucose range: 68-209  72-269  57-200  52-256    Mean/median: 115  162  120  102  129+/-65     Hypoglycemic awareness: Has symptoms of feeling tired, headache, sweaty. Variable recognition threshold present  Sometimes may not be aware during the night and will be notified by her sensor that the sugar is low  She has symptoms when blood glucose is less than 60. Uses glucose tablets for treatment when not at home but otherwise eats a snack .    DIET has been relatively better recently overall with lower carbohydrate intake  Again her mealtimes are variable. Frequently will not eat breakfast.  Usually trying to have low fat meals  Food preferences: Yogurt And cottage cheese for breakfast usually.   Physical activity: exercise: She is walking intermittently Historically after exercise blood sugar will get lower in 1-2 hrs but sometimes it may get lower while walking and she carries glucose tablets  with her. .  Certified Diabetes Educator visit: Most recent: 8/12.    Wt Readings from Last 3 Encounters:  09/18/16 201 lb (91.2 kg)  08/27/16 201 lb (91.2 kg)  08/13/16 200 lb 9.6 oz (91 kg)    LABS:  Lab Results  Component Value Date   HGBA1C 7.1 (H) 09/09/2016   HGBA1C 7.0 (H) 06/10/2016   HGBA1C 6.9 (H) 03/11/2016   Lab Results  Component Value Date   MICROALBUR <0.7 05/09/2016   LDLCALC 62 09/09/2016   CREATININE 0.85 09/09/2016    No visits with results within 1 Week(s) from this visit.  Latest known visit with results is:  Lab on 09/09/2016  Component Date Value Ref  Range Status  . Hgb A1c MFr Bld 09/09/2016 7.1* 4.6 - 6.5 % Final  . Sodium 09/09/2016 139  135 - 145 mEq/L Final  . Potassium 09/09/2016 4.3  3.5 - 5.1 mEq/L Final  . Chloride 09/09/2016 104  96 - 112 mEq/L Final  . CO2 09/09/2016 30  19 - 32 mEq/L Final  . Glucose, Bld 09/09/2016 103* 70 - 99 mg/dL Final  . BUN 09/09/2016 17  6 - 23 mg/dL Final  . Creatinine, Ser 09/09/2016 0.85  0.40 - 1.20 mg/dL Final  . Total Bilirubin 09/09/2016 0.5  0.2 - 1.2 mg/dL Final  . Alkaline Phosphatase 09/09/2016 83  39 - 117 U/L Final  . AST 09/09/2016 19  0 - 37 U/L Final  . ALT 09/09/2016 18  0 - 35 U/L Final  . Total Protein 09/09/2016 6.4  6.0 - 8.3 g/dL Final  . Albumin 09/09/2016 3.8  3.5 - 5.2 g/dL Final  . Calcium 09/09/2016 10.0  8.4 - 10.5 mg/dL Final  . GFR 09/09/2016 70.54  >60.00 mL/min Final  . Cholesterol 09/09/2016 130  0 - 200 mg/dL Final  . Triglycerides 09/09/2016 65.0  0.0 - 149.0 mg/dL Final  . HDL 09/09/2016 54.90  >39.00 mg/dL Final  . VLDL 09/09/2016 13.0  0.0 - 40.0 mg/dL Final  . LDL Cholesterol 09/09/2016 62  0 - 99 mg/dL Final  . Total CHOL/HDL Ratio 09/09/2016 2   Final  . NonHDL 09/09/2016 74.60   Final      Medication List       Accurate as of 09/18/16  1:47 PM. Always use your most recent med list.          aspirin 325 MG tablet Take 325 mg by mouth daily.   atovaquone-proguanil 250-100 MG Tabs tablet Commonly known as:  MALARONE Take 1 tablet by mouth daily. Start 2 days before travel, continue for 7 days after return.   CALCIUM + D PO Take 600 mg by mouth 2 (two) times daily.   cholecalciferol 1000 units tablet Commonly known as:  VITAMIN D Take 1,000 Units by mouth daily.   clopidogrel 75 MG tablet Commonly known as:  PLAVIX Take 1 tablet (75 mg total) by mouth daily.   glucose blood test strip Commonly known as:  BAYER CONTOUR NEXT TEST Use to check blood sugar 6 times per day.   glucose blood test strip Commonly known as:  BAYER CONTOUR  TEST Use as instructed to check sugar 6 times daily. Dx Code E10.65   ibuprofen 200 MG tablet Commonly known as:  ADVIL,MOTRIN Take 200 mg by mouth every 6 (six) hours as needed.   insulin lispro 100 UNIT/ML injection Commonly known as:  HUMALOG USE MAX 200 UNITS PER DAY WITH INSULIN PUMP   insulin pump  Soln Inject into the skin. Novolog Vial, max 200 units per day with pump   levofloxacin 750 MG tablet Commonly known as:  LEVAQUIN Take 1 tablet (750 mg total) by mouth daily.   metFORMIN 500 MG 24 hr tablet Commonly known as:  GLUCOPHAGE-XR Take 2 tablets by mouth  daily   metoprolol succinate 100 MG 24 hr tablet Commonly known as:  TOPROL-XL TAKE 1 TABLET (100 MG TOTAL) BY MOUTH DAILY.   niacin 1000 MG CR tablet Commonly known as:  NIASPAN Take 1 tablet by mouth at  bedtime   nitroGLYCERIN 0.4 MG SL tablet Commonly known as:  NITROSTAT Place 1 tablet (0.4 mg total) under the tongue every 5 (five) minutes as needed for chest pain.   pioglitazone 15 MG tablet Commonly known as:  ACTOS Take 1 tablet (15 mg total) by mouth daily.   rosuvastatin 40 MG tablet Commonly known as:  CRESTOR Take 1 tablet (40 mg total) by mouth daily.   tretinoin 0.1 % cream Commonly known as:  RETIN-A Apply 1 application topically at bedtime.   typhoid DR capsule Commonly known as:  VIVOTIF Take 1 capsule by mouth every other day.       Allergies:  Allergies  Allergen Reactions  . Septra [Bactrim] Rash  . Sulfa Antibiotics Rash    Past Medical History:  Diagnosis Date  . Arthritis   . Atrial flutter ([place])    a. s/p TEE/DCCV 08/16/13 (normal LV function, no LAA thrombus).b. s/p ablation by Dr Lovena Le 09-23-2013  . Coronary artery disease    2009 LAD Promus stent  . Fibroid   . Hyperlipidemia   . Osteopenia   . Type 1 diabetes mellitus on insulin therapy ([place])   . Valvular heart disease    a. Mild MR/TR by TEE 08/2013.    Past Surgical History:  Procedure Laterality  Date  . ABLATION  09-23-2013   CTI by Dr Lovena Le  . APPENDECTOMY    . ATRIAL FLUTTER ABLATION N/A 09/23/2013   Procedure: ATRIAL FLUTTER ABLATION;  Surgeon: Evans Lance, MD;  Location: [facility] CATH LAB;  Service: Cardiovascular;  Laterality: N/A;  . CARDIOVERSION N/A 08/16/2013   Procedure: CARDIOVERSION;  Surgeon: Thayer Headings, MD;  Location: [place];  Service: Cardiovascular;  Laterality: N/A;  spoke with [name]  . CATARACT EXTRACTION Bilateral   . COLONOSCOPY    . CORONARY ANGIOPLASTY WITH STENT PLACEMENT    . MYOMECTOMY  1977  . PELVIC LAPAROSCOPY    . TEE WITHOUT CARDIOVERSION N/A 08/16/2013   Procedure: TRANSESOPHAGEAL ECHOCARDIOGRAM (TEE);  Surgeon: Thayer Headings, MD;  Location: [facility] ENDOSCOPY;  Service: Cardiovascular;  Laterality: N/A;    Family History  Problem Relation Age of Onset  . CAD Father 25    died age 29  . CAD Brother 61    small vessel disease  . Atrial fibrillation Mother   . Diabetes Mother   . Hypertension Mother   . CAD Cousin     paternal  . CAD Paternal Grandfather     early onset  . Colon cancer Neg Hx     Social History:  reports that she has never smoked. She has never used smokeless tobacco. She reports that she drinks about 1.2 oz of alcohol per week . She reports that she does not use drugs.  REVIEW of systems:   She has had diabetic dyslipidemia with significantly high LDL particle number and this has been managed with Crestor 40 mg and Niaspan 1000  mg In 3/16 her particle number was below 1000 and Her last level was excellent at 856  Her last LDL is below 70, HDL excellent   Lab Results  Component Value Date   CHOL 130 09/09/2016   HDL 54.90 09/09/2016   LDLCALC 62 09/09/2016   LDLDIRECT 79.0 01/11/2015   TRIG 65.0 09/09/2016   CHOLHDL 2 09/09/2016   She has had atrial arrhythmias treated with metoprolol    BP 112/60   Pulse 69   Ht 5\' 10"  (1.778 m)   Wt 201 lb (91.2 kg)   SpO2 96%   BMI 28.84 kg/m     ASSESSMENT/PLAN:  DIABETES type 1 with fair control, A1c is good at 7% considering difficulty getting consistent control  See history of present illness for detailed discussion of her current blood sugar patterns and management with her pump and Review of her home continuous glucose monitoring   Her blood sugar control is overall fairly well controlled but has been showing different patterns are within the last 2 weeks He will know she was having high readings midday she has had normal or low normal readings during the day the last for 5 days and some tendency to nocturnal low blood sugars as seen on her CGM now This is without any exercise She may be eating less with her busy lifestyle and traveling out of town to take care of her mother Currently she is not comfortable changing her basal rates on her own even when her blood sugars get low  Recommendations:  She continues reduced basal rate temporarily at least when her blood sugars are consistently low especially at night  Try to use CGM consistently now, previously had lost her monitor  Reduce basal rates after 10 PM and during the night as follows: Midnight = 0.85, 2 AM = 0.7, 5 AM = 1.65 and 7 PM = 2.5, 10 PM = 1.50  Call if blood sugars are again getting low  Resume exercise when able to and use her CGM to help adjust her temper basal rate for exercising  Again reminded her to continue to add extra boluses for higher fat meals and extended boluses  Stay on metformin and Actos  HYPERLIPIDEMIA: Well controlled, continue same regimen  Counseling time over 50% of today's 25 minute visit on various topics as above  Takhia Spoon

## 2016-09-25 ENCOUNTER — Encounter: Payer: Self-pay | Admitting: Cardiovascular Disease

## 2016-09-25 ENCOUNTER — Ambulatory Visit (INDEPENDENT_AMBULATORY_CARE_PROVIDER_SITE_OTHER): Payer: Medicare Other | Admitting: Cardiovascular Disease

## 2016-09-25 VITALS — BP 128/70 | HR 63 | Ht 70.0 in | Wt 202.1 lb

## 2016-09-25 DIAGNOSIS — I2510 Atherosclerotic heart disease of native coronary artery without angina pectoris: Secondary | ICD-10-CM

## 2016-09-25 DIAGNOSIS — E785 Hyperlipidemia, unspecified: Secondary | ICD-10-CM | POA: Diagnosis not present

## 2016-09-25 NOTE — Patient Instructions (Signed)
Medication Instructions:  None Ordered   Labwork: None Ordered   Testing/Procedures: None Ordered   Follow-Up: Your physician wants you to follow-up in: 6 months with Dr. Acie Fredrickson. You will receive a reminder letter in the mail two months in advance. If you don't receive a letter, please call our office to schedule the follow-up appointment.   Any Other Special Instructions Will Be Listed Below (If Applicable).     If you need a refill on your cardiac medications before your next appointment, please call your pharmacy.

## 2016-09-25 NOTE — Progress Notes (Signed)
Marianna Payment Date of Birth  May 11, 1946 [place] [address]    [place]   [place], [place]  [address]    [place], [place]  [address] [phone]  Fax  [phone]  [phone]  Fax [phone]   Problems. 1. CAD 2. Atrial Flutter - s/p Aflutter ablation Nov. 2014.  3. Diabetes Mellitus.   History of Present Illness:  Weltha is doing very well. She's not having episodes of chest pain or shortness of breath. She's been able to below of her normal activities without any significant problems. She has not had chest pains .  Her insurance will run out the month before she goes on Medicare.   She has not had any CP and no palpitations.    April 08, 2013:   Sendi is doing well. No CP.  Rhythm is stable.     Mar 11, 2014:  Breyelle is doing well.  She had a atrial flutter ablation several months ago.  She has not been taking her metoprolol..  Nov. 9, 2015:  Shaylan is doing well. No Cp , no dyspnea.  Active, not exerciseing as much as she would like.  Mar 16, 2015:   Dipali is doing well.   Doing well from a cardiac standpoint.   Nov. 10, 2016  Lots of stress - her 74 yo mom is living with her.  Will not let anyone else do anything Lots of stress No CP , no arrhythmias No time to exercise   Mar 20, 2016:  Doing well Her mother is living back in [place].   Stress is a bit less.  Exercising some .   Nov. 22, 2017.  Doing well . Had afib ablation several years ago and feels great.  DM is well controlled.  Has UTIs frequently .     Current Outpatient Prescriptions on File Prior to Visit  Medication Sig Dispense Refill  . aspirin 325 MG tablet Take 325 mg by mouth daily.    . Calcium Carbonate-Vitamin D (CALCIUM + D PO) Take 600 mg by mouth 2 (two) times daily.     . cholecalciferol (VITAMIN D) 1000 units tablet Take 1,000 Units by mouth daily.    . clopidogrel (PLAVIX) 75 MG tablet Take 1 tablet (75 mg  total) by mouth daily. 90 tablet 0  . glucose blood (BAYER CONTOUR TEST) test strip Use as instructed to check sugar 6 times daily. Dx Code E10.65 550 each 3  . ibuprofen (ADVIL,MOTRIN) 200 MG tablet Take 200 mg by mouth every 6 (six) hours as needed.    . Insulin Aspart (INSULIN PUMP) 100 unit/ml SOLN Inject into the skin. Novolog Vial, max 200 units per day with pump    . insulin lispro (HUMALOG) 100 UNIT/ML injection USE MAX 200 UNITS PER DAY WITH INSULIN PUMP 90 mL 1  . metFORMIN (GLUCOPHAGE-XR) 500 MG 24 hr tablet Take 2 tablets by mouth  daily 180 tablet 1  . metoprolol succinate (TOPROL-XL) 100 MG 24 hr tablet TAKE 1 TABLET (100 MG TOTAL) BY MOUTH DAILY. 90 tablet 3  . niacin (NIASPAN) 1000 MG CR tablet Take 1 tablet by mouth at  bedtime 90 tablet 1  . nitroGLYCERIN (NITROSTAT) 0.4 MG SL tablet Place 1 tablet (0.4 mg total) under the tongue every 5 (five) minutes as needed for chest pain. 25 tablet 6  . pioglitazone (ACTOS) 15 MG tablet Take 1 tablet (15 mg  total) by mouth daily. 90 tablet 0  . rosuvastatin (CRESTOR) 40 MG tablet Take 1 tablet (40 mg total) by mouth daily. 90 tablet 0  . tretinoin (RETIN-A) 0.1 % cream Apply 1 application topically at bedtime.   11   No current facility-administered medications on file prior to visit.     Allergies  Allergen Reactions  . Septra [Bactrim] Rash  . Sulfa Antibiotics Rash    Past Medical History:  Diagnosis Date  . Arthritis   . Atrial flutter ([place])    a. s/p TEE/DCCV 08/16/13 (normal LV function, no LAA thrombus).b. s/p ablation by Dr Lovena Le 09-23-2013  . Coronary artery disease    2009 LAD Promus stent  . Fibroid   . Hyperlipidemia   . Osteopenia   . Type 1 diabetes mellitus on insulin therapy ([place])   . Valvular heart disease    a. Mild MR/TR by TEE 08/2013.    Past Surgical History:  Procedure Laterality Date  . ABLATION  09-23-2013   CTI by Dr Lovena Le  . APPENDECTOMY    . ATRIAL FLUTTER ABLATION N/A 09/23/2013    Procedure: ATRIAL FLUTTER ABLATION;  Surgeon: Evans Lance, MD;  Location: [facility] CATH LAB;  Service: Cardiovascular;  Laterality: N/A;  . CARDIOVERSION N/A 08/16/2013   Procedure: CARDIOVERSION;  Surgeon: Thayer Headings, MD;  Location: [facility];  Service: Cardiovascular;  Laterality: N/A;  spoke with [name]  . CATARACT EXTRACTION Bilateral   . COLONOSCOPY    . CORONARY ANGIOPLASTY WITH STENT PLACEMENT    . MYOMECTOMY  1977  . PELVIC LAPAROSCOPY    . TEE WITHOUT CARDIOVERSION N/A 08/16/2013   Procedure: TRANSESOPHAGEAL ECHOCARDIOGRAM (TEE);  Surgeon: Thayer Headings, MD;  Location[facility];  Service: Cardiovascular;  Laterality: N/A;    History  Smoking Status  . Never Smoker  Smokeless Tobacco  . Never Used    History  Alcohol Use  . 1.2 oz/week  . 2 Glasses of wine per week    Family History  Problem Relation Age of Onset  . CAD Father 28    died age 66  . CAD Brother 59    small vessel disease  . Atrial fibrillation Mother   . Diabetes Mother   . Hypertension Mother   . CAD Cousin     paternal  . CAD Paternal Grandfather     early onset  . Colon cancer Neg Hx     Reviw of Systems:  Reviewed in the HPI.  All other systems are negative.  Physical Exam: Blood pressure 128/70, pulse 63, height 5\' 10"  (1.778 m), weight 202 lb 1.9 oz (91.7 kg). General: Well developed, well nourished, in no acute distress.  Head: Normocephalic, atraumatic, sclera non-icteric, mucus membranes are moist,   Neck: Supple. Negative for carotid bruits. JVD not elevated.  Lungs: Clear bilaterally to auscultation without wheezes, rales, or rhonchi. Breathing is unlabored.  Heart: RRR with S1 S2. No murmurs, rubs, or gallops appreciated.  Abdomen: Soft, non-tender, non-distended with normoactive bowel sounds. No hepatomegaly. No rebound/guarding. No obvious abdominal masses.  Msk:  Strength and tone appear normal for age.  Extremities: No clubbing or cyanosis. No edema.  Distal  pedal pulses are 2+ and equal bilaterally.  Neuro: Alert and oriented X 3. Moves all extremities spontaneously.  Psych:  Responds to questions appropriately with a normal affect.  ECG:  Nov. 22, 2017:   NSR at 63.   NS ST abn.  No changes.   Assessment / Plan:  1. CAD - doing well.  No angina . Continue current meds  2. Atrial Flutter - s/p Aflutter ablation Nov. 2014.  No evidence of recurrence.    3. Diabetes Mellitus.  4. Hperlipidemia:  Lipids look great.continue meds.  Encouraged her to walk or go to the [place] work out area    Mertie Moores, MD  09/25/2016 9:39 [place] [address]  [place], [place]  [address] Pager [phone] Phone: [phone]; Fax: [phone]   [facility]  [address] [place], [place]  [address] [phone]    Fax [phone]

## 2016-10-08 NOTE — Telephone Encounter (Signed)
Patient would like to know if she could take insulin Novalog instead of Humalog she will save 1000.00 a month and she will not go in the doughnut hole.  she need to know before Thursday afternoon is the latest.    [place]!!  PLEASE :)

## 2016-10-09 ENCOUNTER — Telehealth: Payer: Self-pay | Admitting: Endocrinology

## 2016-10-09 NOTE — Telephone Encounter (Signed)
Patient would like to know if she could take insulin Humalog instead of Novalog she will save 1000.00 a month and she will not go in the doughnut hole.  she need to know before Thursday afternoon is the latest.  [place]!!  PLEASE :)  She gave me the wrong information the first. It is the Humalog instead of the Novalog.

## 2016-10-10 NOTE — Telephone Encounter (Signed)
Has been resolved ?

## 2016-11-11 ENCOUNTER — Encounter: Payer: Medicare Other | Admitting: Internal Medicine

## 2016-11-12 ENCOUNTER — Encounter: Payer: Self-pay | Admitting: Internal Medicine

## 2016-11-12 ENCOUNTER — Encounter: Payer: Medicare Other | Admitting: Internal Medicine

## 2016-11-12 ENCOUNTER — Encounter: Payer: Self-pay | Admitting: Cardiovascular Disease

## 2016-11-12 ENCOUNTER — Encounter: Payer: Self-pay | Admitting: Endocrinology

## 2016-11-13 ENCOUNTER — Other Ambulatory Visit: Payer: Self-pay | Admitting: *Deleted

## 2016-11-13 MED ORDER — NITROGLYCERIN 0.4 MG SL SUBL: 0.4000 mg | SUBLINGUAL_TABLET | SUBLINGUAL | 1 refills | Status: DC | PRN

## 2016-11-13 MED ORDER — METOPROLOL SUCCINATE ER 100 MG PO TB24: ORAL_TABLET | ORAL | 3 refills | Status: DC

## 2016-11-14 NOTE — Telephone Encounter (Signed)
There is nowhere to put the info regarding her rx plan except in a note so this serves that purpose. Thank you

## 2016-11-20 ENCOUNTER — Encounter: Payer: Medicare Other | Admitting: Internal Medicine

## 2016-11-25 NOTE — Telephone Encounter (Signed)
Please call in actos, metformin, plavix, and novolog 90 day supply to the [facility] order pharmacy

## 2016-12-02 ENCOUNTER — Other Ambulatory Visit: Payer: Self-pay

## 2016-12-02 MED ORDER — PIOGLITAZONE HCL 15 MG PO TABS
15.0000 mg | ORAL_TABLET | Freq: Every day | ORAL | 0 refills | Status: DC
Start: 2016-12-02 — End: 2016-12-11

## 2016-12-02 MED ORDER — METFORMIN HCL ER 500 MG PO TB24: ORAL_TABLET | ORAL | 1 refills | Status: DC

## 2016-12-02 MED ORDER — INSULIN LISPRO 100 UNIT/ML ~~LOC~~ SOLN: SUBCUTANEOUS | 1 refills | Status: DC

## 2016-12-10 NOTE — Telephone Encounter (Signed)
Pt please call in the actos, metformin, plavix, and novolog 90 day supply

## 2016-12-11 ENCOUNTER — Other Ambulatory Visit: Payer: Self-pay

## 2016-12-11 MED ORDER — METFORMIN HCL ER 500 MG PO TB24: ORAL_TABLET | ORAL | 1 refills | Status: DC

## 2016-12-11 MED ORDER — CLOPIDOGREL BISULFATE 75 MG PO TABS
75.0000 mg | ORAL_TABLET | Freq: Every day | ORAL | 0 refills | Status: DC
Start: 2016-12-11 — End: 2017-05-15

## 2016-12-11 MED ORDER — PIOGLITAZONE HCL 15 MG PO TABS: 15.0000 mg | ORAL_TABLET | Freq: Every day | ORAL | 0 refills | Status: DC

## 2016-12-11 MED ORDER — INSULIN LISPRO 100 UNIT/ML ~~LOC~~ SOLN: SUBCUTANEOUS | 1 refills | Status: DC

## 2016-12-11 NOTE — Telephone Encounter (Signed)
Refills submitted.  

## 2016-12-16 ENCOUNTER — Other Ambulatory Visit: Payer: Self-pay

## 2016-12-16 ENCOUNTER — Other Ambulatory Visit: Payer: Medicare Other

## 2016-12-16 MED ORDER — METFORMIN HCL ER 500 MG PO TB24: ORAL_TABLET | ORAL | 1 refills | Status: DC

## 2016-12-16 MED ORDER — INSULIN LISPRO 100 UNIT/ML ~~LOC~~ SOLN: SUBCUTANEOUS | 1 refills | Status: DC

## 2016-12-19 ENCOUNTER — Ambulatory Visit: Payer: Medicare Other | Admitting: Endocrinology

## 2016-12-20 ENCOUNTER — Other Ambulatory Visit (INDEPENDENT_AMBULATORY_CARE_PROVIDER_SITE_OTHER): Payer: Medicare Other

## 2016-12-20 ENCOUNTER — Other Ambulatory Visit: Payer: Self-pay

## 2016-12-20 DIAGNOSIS — E1065 Type 1 diabetes mellitus with hyperglycemia: Secondary | ICD-10-CM

## 2016-12-20 LAB — BASIC METABOLIC PANEL
BUN: 13 mg/dL (ref 6–23)
CHLORIDE: 103 meq/L (ref 96–112)
CO2: 27 meq/L (ref 19–32)
Calcium: 8.9 mg/dL (ref 8.4–10.5)
Creatinine, Ser: 0.78 mg/dL (ref 0.40–1.20)
GFR: 77.83 mL/min (ref >60.00)
Glucose, Bld: 290 mg/dL — ABNORMAL HIGH (ref 70–99)
POTASSIUM: 4.1 meq/L (ref 3.5–5.1)
Sodium: 136 mEq/L (ref 135–145)

## 2016-12-20 LAB — HEMOGLOBIN A1C: HEMOGLOBIN A1C: 8.3 % — AB (ref 4.6–6.5)

## 2016-12-20 MED ORDER — INSULIN ASPART 100 UNIT/ML ~~LOC~~ SOLN: SUBCUTANEOUS | 1 refills | Status: DC

## 2016-12-20 MED ORDER — NIACIN ER (ANTIHYPERLIPIDEMIC) 1000 MG PO TBCR: EXTENDED_RELEASE_TABLET | ORAL | 1 refills | Status: DC

## 2016-12-20 MED ORDER — ROSUVASTATIN CALCIUM 40 MG PO TABS: 40.0000 mg | ORAL_TABLET | Freq: Every day | ORAL | 1 refills | Status: DC

## 2016-12-23 ENCOUNTER — Other Ambulatory Visit: Payer: Medicare Other

## 2016-12-26 ENCOUNTER — Ambulatory Visit (INDEPENDENT_AMBULATORY_CARE_PROVIDER_SITE_OTHER): Payer: Medicare Other | Admitting: Endocrinology

## 2016-12-26 ENCOUNTER — Encounter: Payer: Self-pay | Admitting: Endocrinology

## 2016-12-26 VITALS — BP 132/76 | HR 74 | Ht 70.0 in | Wt 204.0 lb

## 2016-12-26 DIAGNOSIS — E1065 Type 1 diabetes mellitus with hyperglycemia: Secondary | ICD-10-CM

## 2016-12-26 NOTE — Progress Notes (Signed)
Patient ID: [name], female   DOB: 1946-07-02, 71 y.o.   MRN: [id number]   Reason for visit: DIABETES followup.  Diagnosis: Type 1 diabetes mellitus, date of diagnosis: 1979.    HISTORY of present illness:   Insulin Pump: CURRENT brand: 630  The pump settings are: Midnight = 0.8, 5 AM = 1.75, 8 AM = 1.65, 11 AM = 1.0; 5 PM = 2.1, 7 PM = 2.6 and 10 PM = 1.8.   Total daily insulin = 44.5 units, 80% basal   Carbohydrate ratio 1:15 breakfast, lunch and 1:8 at dinner; sensitivity 1: 35, 30 after 6 AM; target 120 +/-10  Insulin on board indicator on, duration 4 hrs   DIABETES: She has had long-standing diabetes with A1c levels usually above target. In 10/13 ACTOS was restarted because of her large insulin requirement and diabetic dyslipidemia.  Her blood sugars improved significantly reduced.  Also has been taking metformin as insulin sensitizer  RECENT history:  Her A1c on the last visit was 7.1 and now 8.3  Current blood sugar patterns from pump and Sensor download, management and problems identified:   Her Sensor has been active only for about 60% of the time  Although she was having overall high readings between 2/11 and 2/13 especially overnight and this does not appear to be a consistent pattern and has a couple of nights with her blood sugars have been fairly good  FASTING blood sugars however appeared to be mostly high, probably starting to go up around 4-5 AM  Fingerstick glucose readings show FASTING range mostly over 200, late morning readings this week have been about 150  Blood sugars around lunchtime usually fairly good  Evening blood sugars are quite variable with some increase in blood sugar in the afternoons and occasionally evenings him a even with her continuous glucose monitoring this does not appear to be a consistent pattern   However at least with her fingersticks appears to be having high readings postprandial  after supper; she thinks she is  trying to use extra insulin for higher fat meals and some dual wave boluses  She did have high readings after lunch when she was traveling about 10 days ago  Average glucose on the CGMS 203 but on her pump is 179  COMPLIANCE with boluses has been fairly good although difficult to assess because she sometimes does not enter her carbohydrates at the time of her meals or snacks  Monitors blood glucose: on an average 4-5 times a day according to pump download.  Blood Glucose readings as discussed above  Hypoglycemic awareness: Has symptoms of feeling tired, headache, sweaty. Variable recognition threshold present  Sometimes may not be aware during the night and will be notified by her sensor that the sugar is low  She has symptoms when blood glucose is less than 60. Uses glucose tablets for treatment when not at home but otherwise eats a snack .    DIET has been relatively better recently overall with lower carbohydrate intake  Again her mealtimes are variable. Frequently will not eat breakfast.  Usually trying to have low fat meals  Food preferences: Yogurt And cottage cheese for breakfast usually.   Physical activity: exercise: She is walking intermittently Historically after exercise blood sugar will get lower in 1-2 hrs but sometimes it may get lower while walking and she carries glucose tablets with her. .  Certified Diabetes Educator visit: Most recent: 8/12.    Wt Readings from Last 3  Encounters:  12/26/16 204 lb (92.5 kg)  09/25/16 202 lb 1.9 oz (91.7 kg)  09/18/16 201 lb (91.2 kg)    LABS:  Lab Results  Component Value Date   HGBA1C 8.3 (H) 12/20/2016   HGBA1C 7.1 (H) 09/09/2016   HGBA1C 7.0 (H) 06/10/2016   Lab Results  Component Value Date   MICROALBUR <0.7 05/09/2016   LDLCALC 62 09/09/2016   CREATININE 0.78 12/20/2016    Lab on 12/20/2016  Component Date Value Ref Range Status  . Hgb A1c MFr Bld 12/20/2016 8.3* 4.6 - 6.5 % Final  . Sodium 12/20/2016 136  135  - 145 mEq/L Final  . Potassium 12/20/2016 4.1  3.5 - 5.1 mEq/L Final  . Chloride 12/20/2016 103  96 - 112 mEq/L Final  . CO2 12/20/2016 27  19 - 32 mEq/L Final  . Glucose, Bld 12/20/2016 290* 70 - 99 mg/dL Final  . BUN 12/20/2016 13  6 - 23 mg/dL Final  . Creatinine, Ser 12/20/2016 0.78  0.40 - 1.20 mg/dL Final  . Calcium 12/20/2016 8.9  8.4 - 10.5 mg/dL Final  . GFR 12/20/2016 77.83  >60.00 mL/min Final    Allergies as of 12/26/2016      Reactions   Septra [bactrim] Rash   Sulfa Antibiotics Rash      Medication List       Accurate as of 12/26/16  9:14 AM. Always use your most recent med list.          aspirin 325 MG tablet Take 325 mg by mouth daily.   CALCIUM + D PO Take 600 mg by mouth 2 (two) times daily.   cholecalciferol 1000 units tablet Commonly known as:  VITAMIN D Take 1,000 Units by mouth daily.   clopidogrel 75 MG tablet Commonly known as:  PLAVIX Take 1 tablet (75 mg total) by mouth daily.   glucose blood test strip Commonly known as:  BAYER CONTOUR TEST Use as instructed to check sugar 6 times daily. Dx Code E10.65   ibuprofen 200 MG tablet Commonly known as:  ADVIL,MOTRIN Take 200 mg by mouth every 6 (six) hours as needed.   insulin aspart 100 UNIT/ML injection Commonly known as:  NOVOLOG Use Max 200 units per day in insulin pump   insulin lispro 100 UNIT/ML injection Commonly known as:  HUMALOG USE MAX 200 UNITS PER DAY WITH INSULIN PUMP   insulin pump Soln Inject into the skin. Novolog Vial, max 200 units per day with pump   metFORMIN 500 MG 24 hr tablet Commonly known as:  GLUCOPHAGE-XR Take 2 tablets by mouth  daily   metoprolol succinate 100 MG 24 hr tablet Commonly known as:  TOPROL-XL TAKE 1 TABLET (100 MG TOTAL) BY MOUTH DAILY.   niacin 1000 MG CR tablet Commonly known as:  NIASPAN Take 1 tablet by mouth at  bedtime   nitroGLYCERIN 0.4 MG SL tablet Commonly known as:  NITROSTAT Place 1 tablet (0.4 mg total) under the tongue  every 5 (five) minutes as needed for chest pain.   pioglitazone 15 MG tablet Commonly known as:  ACTOS Take 1 tablet (15 mg total) by mouth daily.   rosuvastatin 40 MG tablet Commonly known as:  CRESTOR Take 1 tablet (40 mg total) by mouth daily.   tretinoin 0.1 % cream Commonly known as:  RETIN-A Apply 1 application topically at bedtime.       Allergies:  Allergies  Allergen Reactions  . Septra [Bactrim] Rash  . Sulfa Antibiotics Rash  Past Medical History:  Diagnosis Date  . Arthritis   . Atrial flutter ([place])    a. s/p TEE/DCCV 08/16/13 (normal LV function, no LAA thrombus).b. s/p ablation by Dr Lovena Le 09-23-2013  . Coronary artery disease    2009 LAD Promus stent  . Fibroid   . Hyperlipidemia   . Osteopenia   . Type 1 diabetes mellitus on insulin therapy ([place])   . Valvular heart disease    a. Mild MR/TR by TEE 08/2013.    Past Surgical History:  Procedure Laterality Date  . ABLATION  09-23-2013   CTI by Dr Lovena Le  . APPENDECTOMY    . ATRIAL FLUTTER ABLATION N/A 09/23/2013   Procedure: ATRIAL FLUTTER ABLATION;  Surgeon: Evans Lance, MD;  Location: [facility] CATH LAB;  Service: Cardiovascular;  Laterality: N/A;  . CARDIOVERSION N/A 08/16/2013   Procedure: CARDIOVERSION;  Surgeon: Thayer Headings, MD;  Location: [place];  Service: Cardiovascular;  Laterality: N/A;  spoke with [name]  . CATARACT EXTRACTION Bilateral   . COLONOSCOPY    . CORONARY ANGIOPLASTY WITH STENT PLACEMENT    . MYOMECTOMY  1977  . PELVIC LAPAROSCOPY    . TEE WITHOUT CARDIOVERSION N/A 08/16/2013   Procedure: TRANSESOPHAGEAL ECHOCARDIOGRAM (TEE);  Surgeon: Thayer Headings, MD;  Location: [facility] ENDOSCOPY;  Service: Cardiovascular;  Laterality: N/A;    Family History  Problem Relation Age of Onset  . CAD Father 62    died age 69  . CAD Brother 18    small vessel disease  . Atrial fibrillation Mother   . Diabetes Mother   . Hypertension Mother   . CAD Cousin     paternal  . CAD Paternal  Grandfather     early onset  . Colon cancer Neg Hx     Social History:  reports that she has never smoked. She has never used smokeless tobacco. She reports that she drinks about 1.2 oz of alcohol per week . She reports that she does not use drugs.  REVIEW of systems:  The following is a copy of the previous note  She has had diabetic dyslipidemia with significantly high LDL particle number and this has been managed with Crestor 40 mg and Niaspan 1000 mg In 3/16 her particle number was below 1000 and Her last level was excellent at 856  Her last LDL is below 70, HDL excellent   Lab Results  Component Value Date   CHOL 130 09/09/2016   HDL 54.90 09/09/2016   LDLCALC 62 09/09/2016   LDLDIRECT 79.0 01/11/2015   TRIG 65.0 09/09/2016   CHOLHDL 2 09/09/2016   She has had atrial arrhythmias treated with metoprolol    BP 132/76   Pulse 74   Ht 5\' 10"  (1.778 m)   Wt 204 lb (92.5 kg)   SpO2 95%   BMI 29.27 kg/m    ASSESSMENT/PLAN:  DIABETES type 1 with fair control, A1c is good at 7% considering difficulty getting consistent control  See history of present illness for detailed discussion of her current blood sugar patterns and management with her pump and Review of her home continuous glucose monitoring   A1c is over 8% now However as discussed above she does not appear to have any consistent patterns that can be confirmed on her recent blood sugar analysis Most likely has high readings overnight but has not had sensory tracings consistently in the last few days Her fingersticks do indicate high fasting readings fairly consistently even with bedtime readings are not  high Postprandial readings may be higher after evening meal Her diet can also be somewhat high in fact especially when traveling making the boluses difficult to achieve  Recommendations:  She will increase her basal rate at 5 AM up to 1.9 and 8 AM up to 1.7  Use carbohydrate coverage 1:7 at supper  She will  try to get more consistent results from her CGM, she can call her supportive needed  She will try to download her pump before coming here  Call if having any unusual hypoglycemia  Again extended boluses and extra insulin recommended for higher fat meals  We'll also needs short-term follow-up in 6 weeks  HYPERLIPIDEMIA: Well controlled, continue same regimen  Counseling time on subjects discussed above is over 50% of today's 25 minute visit  Alexei Doswell

## 2016-12-31 ENCOUNTER — Ambulatory Visit (INDEPENDENT_AMBULATORY_CARE_PROVIDER_SITE_OTHER): Payer: Medicare Other | Admitting: Internal Medicine

## 2016-12-31 ENCOUNTER — Encounter: Payer: Self-pay | Admitting: Internal Medicine

## 2016-12-31 VITALS — BP 140/78 | HR 76 | Temp 97.5°F | Ht 70.0 in | Wt 204.0 lb

## 2016-12-31 DIAGNOSIS — E1065 Type 1 diabetes mellitus with hyperglycemia: Secondary | ICD-10-CM

## 2016-12-31 DIAGNOSIS — Z0000 Encounter for general adult medical examination without abnormal findings: Secondary | ICD-10-CM | POA: Diagnosis not present

## 2016-12-31 DIAGNOSIS — IMO0002 Reserved for concepts with insufficient information to code with codable children: Secondary | ICD-10-CM

## 2016-12-31 DIAGNOSIS — E1042 Type 1 diabetes mellitus with diabetic polyneuropathy: Secondary | ICD-10-CM

## 2016-12-31 NOTE — Progress Notes (Signed)
Pre visit review using our clinic review tool, if applicable. No additional management support is needed unless otherwise documented below in the visit note. 

## 2016-12-31 NOTE — Progress Notes (Signed)
   Subjective:    Patient ID: [name], female    DOB: [birth year], 71 y.o.   MRN: [id number]  HPI Here for medicare wellness, no new complaints. Please see A/P for status and treatment of chronic medical problems.   Diet: DM since diabetic Physical activity: sedentary Depression/mood screen: negative Hearing: intact to whispered voice, mild loss Visual acuity: grossly normal, performs annual eye exam  ADLs: capable Fall risk: none Home safety: good Cognitive evaluation: intact to orientation, naming, recall and repetition EOL planning: adv directives discussed  I have personally reviewed and have noted 1. The patient's medical and social history - reviewed today no changes 2. Their use of alcohol, tobacco or illicit drugs 3. Their current medications and supplements 4. The patient's functional ability including ADL's, fall risks, home safety risks and hearing or visual impairment. 5. Diet and physical activities 6. Evidence for depression or mood disorders 7. Care team reviewed and updated (available in snapshot)  Review of Systems  Constitutional: Negative.   HENT: Negative.   Eyes: Negative.   Respiratory: Negative for cough, chest tightness and shortness of breath.   Cardiovascular: Negative for chest pain, palpitations and leg swelling.  Gastrointestinal: Negative for abdominal distention, abdominal pain, constipation, diarrhea, nausea and vomiting.  Musculoskeletal: Negative.   Skin: Negative.   Neurological: Negative.   Psychiatric/Behavioral: Negative.       Objective:   Physical Exam  Constitutional: She is oriented to person, place, and time. She appears well-developed and well-nourished.  HENT:  Head: Normocephalic and atraumatic.  Eyes: EOM are normal.  Neck: Normal range of motion.  Cardiovascular: Normal rate and regular rhythm.   Pulmonary/Chest: Effort normal and breath sounds normal. No respiratory distress. She has no wheezes. She has no rales.    Abdominal: Soft. Bowel sounds are normal. She exhibits no distension. There is no tenderness. There is no rebound.  Musculoskeletal: She exhibits no edema.  Neurological: She is alert and oriented to person, place, and time. Coordination normal.  Skin: Skin is warm and dry.  Psychiatric: She has a normal mood and affect.   Vitals:   12/31/16 0951 12/31/16 1013  BP: (!) 142/78 140/78  Pulse: 76   Temp: 97.5 F (36.4 C)   TempSrc: Oral   SpO2: 99%   Weight: 204 lb (92.5 kg)   Height: 5\' 10"  (1.778 m)       Assessment & Plan:

## 2016-12-31 NOTE — Assessment & Plan Note (Signed)
Colonoscopy due in 2025, tetanus and flu and pneumonia and shingles vaccines are up to date. Bone density and mammogram up to date. Counseled on regular exercise, sun safety and mole surveillance. Given 10 year screening recommendations.

## 2016-12-31 NOTE — Patient Instructions (Signed)
We do not need any blood today and no changes.   Health Maintenance, Female Adopting a healthy lifestyle and getting preventive care can go a long way to promote health and wellness. Talk with your health care provider about what schedule of regular examinations is right for you. This is a good chance for you to check in with your provider about disease prevention and staying healthy. In between checkups, there are plenty of things you can do on your own. Experts have done a lot of research about which lifestyle changes and preventive measures are most likely to keep you healthy. Ask your health care provider for more information. Weight and diet Eat a healthy diet  Be sure to include plenty of vegetables, fruits, low-fat dairy products, and lean protein.  Do not eat a lot of foods high in solid fats, added sugars, or salt.  Get regular exercise. This is one of the most important things you can do for your health.  Most adults should exercise for at least 150 minutes each week. The exercise should increase your heart rate and make you sweat (moderate-intensity exercise).  Most adults should also do strengthening exercises at least twice a week. This is in addition to the moderate-intensity exercise. Maintain a healthy weight  Body mass index (BMI) is a measurement that can be used to identify possible weight problems. It estimates body fat based on height and weight. Your health care provider can help determine your BMI and help you achieve or maintain a healthy weight.  For females 55 years of age and older:  A BMI below 18.5 is considered underweight.  A BMI of 18.5 to 24.9 is normal.  A BMI of 25 to 29.9 is considered overweight.  A BMI of 30 and above is considered obese. Watch levels of cholesterol and blood lipids  You should start having your blood tested for lipids and cholesterol at 69 years of age, then have this test every 5 years.  You may need to have your cholesterol  levels checked more often if:  Your lipid or cholesterol levels are high.  You are older than 69 years of age.  You are at high risk for heart disease. Cancer screening Lung Cancer  Lung cancer screening is recommended for adults 66-69 years old who are at high risk for lung cancer because of a history of smoking.  A yearly low-dose CT scan of the lungs is recommended for people who:  Currently smoke.  Have quit within the past 15 years.  Have at least a 30-pack-year history of smoking. A pack year is smoking an average of one pack of cigarettes a day for 1 year.  Yearly screening should continue until it has been 15 years since you quit.  Yearly screening should stop if you develop a health problem that would prevent you from having lung cancer treatment. Breast Cancer  Practice breast self-awareness. This means understanding how your breasts normally appear and feel.  It also means doing regular breast self-exams. Let your health care provider know about any changes, no matter how small.  If you are in your 20s or 30s, you should have a clinical breast exam (CBE) by a health care provider every 1-3 years as part of a regular health exam.  If you are 35 or older, have a CBE every year. Also consider having a breast X-ray (mammogram) every year.  If you have a family history of breast cancer, talk to your health care provider about genetic  screening.  If you are at high risk for breast cancer, talk to your health care provider about having an MRI and a mammogram every year.  Breast cancer gene (BRCA) assessment is recommended for women who have family members with BRCA-related cancers. BRCA-related cancers include:  Breast.  Ovarian.  Tubal.  Peritoneal cancers.  Results of the assessment will determine the need for genetic counseling and BRCA1 and BRCA2 testing. Cervical Cancer  Your health care provider may recommend that you be screened regularly for cancer of the  pelvic organs (ovaries, uterus, and vagina). This screening involves a pelvic examination, including checking for microscopic changes to the surface of your cervix (Pap test). You may be encouraged to have this screening done every 3 years, beginning at age 66.  For women ages 3-65, health care providers may recommend pelvic exams and Pap testing every 3 years, or they may recommend the Pap and pelvic exam, combined with testing for human papilloma virus (HPV), every 5 years. Some types of HPV increase your risk of cervical cancer. Testing for HPV may also be done on women of any age with unclear Pap test results.  Other health care providers may not recommend any screening for nonpregnant women who are considered low risk for pelvic cancer and who do not have symptoms. Ask your health care provider if a screening pelvic exam is right for you.  If you have had past treatment for cervical cancer or a condition that could lead to cancer, you need Pap tests and screening for cancer for at least 20 years after your treatment. If Pap tests have been discontinued, your risk factors (such as having a new sexual partner) need to be reassessed to determine if screening should resume. Some women have medical problems that increase the chance of getting cervical cancer. In these cases, your health care provider may recommend more frequent screening and Pap tests. Colorectal Cancer  This type of cancer can be detected and often prevented.  Routine colorectal cancer screening usually begins at 69 years of age and continues through 69 years of age.  Your health care provider may recommend screening at an earlier age if you have risk factors for colon cancer.  Your health care provider may also recommend using home test kits to check for hidden blood in the stool.  A small camera at the end of a tube can be used to examine your colon directly (sigmoidoscopy or colonoscopy). This is done to check for the earliest  forms of colorectal cancer.  Routine screening usually begins at age 39.  Direct examination of the colon should be repeated every 5-10 years through 69 years of age. However, you may need to be screened more often if early forms of precancerous polyps or small growths are found. Skin Cancer  Check your skin from head to toe regularly.  Tell your health care provider about any new moles or changes in moles, especially if there is a change in a mole's shape or color.  Also tell your health care provider if you have a mole that is larger than the size of a pencil eraser.  Always use sunscreen. Apply sunscreen liberally and repeatedly throughout the day.  Protect yourself by wearing long sleeves, pants, a wide-brimmed hat, and sunglasses whenever you are outside. Heart disease, diabetes, and high blood pressure  High blood pressure causes heart disease and increases the risk of stroke. High blood pressure is more likely to develop in:  People who have blood pressure  in the high end of the normal range (130-139/85-89 mm Hg).  People who are overweight or obese.  People who are African American.  If you are 50-44 years of age, have your blood pressure checked every 3-5 years. If you are 94 years of age or older, have your blood pressure checked every year. You should have your blood pressure measured twice-once when you are at a hospital or clinic, and once when you are not at a hospital or clinic. Record the average of the two measurements. To check your blood pressure when you are not at a hospital or clinic, you can use:  An automated blood pressure machine at a pharmacy.  A home blood pressure monitor.  If you are between 47 years and 34 years old, ask your health care provider if you should take aspirin to prevent strokes.  Have regular diabetes screenings. This involves taking a blood sample to check your fasting blood sugar level.  If you are at a normal weight and have a low  risk for diabetes, have this test once every three years after 69 years of age.  If you are overweight and have a high risk for diabetes, consider being tested at a younger age or more often. Preventing infection Hepatitis B  If you have a higher risk for hepatitis B, you should be screened for this virus. You are considered at high risk for hepatitis B if:  You were born in a country where hepatitis B is common. Ask your health care provider which countries are considered high risk.  Your parents were born in a high-risk country, and you have not been immunized against hepatitis B (hepatitis B vaccine).  You have HIV or AIDS.  You use needles to inject street drugs.  You live with someone who has hepatitis B.  You have had sex with someone who has hepatitis B.  You get hemodialysis treatment.  You take certain medicines for conditions, including cancer, organ transplantation, and autoimmune conditions. Hepatitis C  Blood testing is recommended for:  Everyone born from [birth year] through 1963.  Anyone with known risk factors for hepatitis C. Sexually transmitted infections (STIs)  You should be screened for sexually transmitted infections (STIs) including gonorrhea and chlamydia if:  You are sexually active and are younger than 69 years of age.  You are older than 69 years of age and your health care provider tells you that you are at risk for this type of infection.  Your sexual activity has changed since you were last screened and you are at an increased risk for chlamydia or gonorrhea. Ask your health care provider if you are at risk.  If you do not have HIV, but are at risk, it may be recommended that you take a prescription medicine daily to prevent HIV infection. This is called pre-exposure prophylaxis (PrEP). You are considered at risk if:  You are sexually active and do not regularly use condoms or know the HIV status of your partner(s).  You take drugs by  injection.  You are sexually active with a partner who has HIV. Talk with your health care provider about whether you are at high risk of being infected with HIV. If you choose to begin PrEP, you should first be tested for HIV. You should then be tested every 3 months for as long as you are taking PrEP. Pregnancy  If you are premenopausal and you may become pregnant, ask your health care provider about preconception counseling.  If you  may become pregnant, take 400 to 800 micrograms (mcg) of folic acid every day.  If you want to prevent pregnancy, talk to your health care provider about birth control (contraception). Osteoporosis and menopause  Osteoporosis is a disease in which the bones lose minerals and strength with aging. This can result in serious bone fractures. Your risk for osteoporosis can be identified using a bone density scan.  If you are 68 years of age or older, or if you are at risk for osteoporosis and fractures, ask your health care provider if you should be screened.  Ask your health care provider whether you should take a calcium or vitamin D supplement to lower your risk for osteoporosis.  Menopause may have certain physical symptoms and risks.  Hormone replacement therapy may reduce some of these symptoms and risks. Talk to your health care provider about whether hormone replacement therapy is right for you. Follow these instructions at home:  Schedule regular health, dental, and eye exams.  Stay current with your immunizations.  Do not use any tobacco products including cigarettes, chewing tobacco, or electronic cigarettes.  If you are pregnant, do not drink alcohol.  If you are breastfeeding, limit how much and how often you drink alcohol.  Limit alcohol intake to no more than 1 drink per day for nonpregnant women. One drink equals 12 ounces of beer, 5 ounces of wine, or 1 ounces of hard liquor.  Do not use street drugs.  Do not share needles.  Ask  your health care provider for help if you need support or information about quitting drugs.  Tell your health care provider if you often feel depressed.  Tell your health care provider if you have ever been abused or do not feel safe at home. This information is not intended to replace advice given to you by your health care provider. Make sure you discuss any questions you have with your health care provider. Document Released: 05/06/2011 Document Revised: 03/28/2016 Document Reviewed: 07/25/2015 Elsevier Interactive Patient Education  2017 [employer].

## 2017-01-22 ENCOUNTER — Ambulatory Visit (INDEPENDENT_AMBULATORY_CARE_PROVIDER_SITE_OTHER): Payer: Medicare Other | Admitting: Family Medicine

## 2017-01-22 ENCOUNTER — Ambulatory Visit: Payer: Self-pay

## 2017-01-22 ENCOUNTER — Encounter: Payer: Self-pay | Admitting: Family Medicine

## 2017-01-22 VITALS — BP 132/84 | HR 78 | Ht 69.5 in | Wt 209.0 lb

## 2017-01-22 DIAGNOSIS — M79673 Pain in unspecified foot: Secondary | ICD-10-CM

## 2017-01-22 DIAGNOSIS — M65979 Unspecified synovitis and tenosynovitis, unspecified ankle and foot: Secondary | ICD-10-CM | POA: Insufficient documentation

## 2017-01-22 DIAGNOSIS — M7061 Trochanteric bursitis, right hip: Secondary | ICD-10-CM | POA: Diagnosis not present

## 2017-01-22 DIAGNOSIS — M659 Synovitis and tenosynovitis, unspecified: Secondary | ICD-10-CM | POA: Diagnosis not present

## 2017-01-22 NOTE — Progress Notes (Signed)
[name] [address], [place] [address] Phone: [phone] Subjective:      CC: Right foot pain, right hip pain  [id number]  [name] is a 71 y.o. female coming in with complaint of Right foot pain-patient has a history of navicular stress fracture. States that this is more over the first toe. Guided the pain as a dull, throbbing aching sensation. Patient states that it is severe enough that is stopped her from some activity. Has noticed some mild swelling. No numbness. Does not remember any injury. Patient states that it is not as severe pain is when she's had with the navicular stress fracture but does not want to get that bad. Has been wearing good shoes and avoiding being barefoot already.  Patient is also having more of a right hip pain. States that it's more on the lateral aspect. Describes it as a dull, throbbing aching sensation that can wake her up at night. No radiation of pain and no weakness. Does not remember any true injury. States that things like going up or down stairs can be somewhat painful.      Past Medical History:  Diagnosis Date  . Arthritis   . Atrial flutter ([name])    a. s/p TEE/DCCV 08/16/13 (normal LV function, no LAA thrombus).b. s/p ablation by Dr Lovena Le 09-23-2013  . Coronary artery disease    2009 LAD Promus stent  . Fibroid   . Hyperlipidemia   . Osteopenia   . Type 1 diabetes mellitus on insulin therapy ([place])   . Valvular heart disease    a. Mild MR/TR by TEE 08/2013.   Past Surgical History:  Procedure Laterality Date  . ABLATION  09-23-2013   CTI by Dr Lovena Le  . APPENDECTOMY    . ATRIAL FLUTTER ABLATION N/A 09/23/2013   Procedure: ATRIAL FLUTTER ABLATION;  Surgeon: Evans Lance, MD;  Location: [facility] CATH LAB;  Service: Cardiovascular;  Laterality: N/A;  . CARDIOVERSION N/A 08/16/2013   Procedure: CARDIOVERSION;  Surgeon: Thayer Headings, MD;  Location: [facility];  Service: Cardiovascular;   Laterality: N/A;  spoke with [name]  . CATARACT EXTRACTION Bilateral   . COLONOSCOPY    . CORONARY ANGIOPLASTY WITH STENT PLACEMENT    . MYOMECTOMY  1977  . PELVIC LAPAROSCOPY    . TEE WITHOUT CARDIOVERSION N/A 08/16/2013   Procedure: TRANSESOPHAGEAL ECHOCARDIOGRAM (TEE);  Surgeon: Thayer Headings, MD;  Location: [facility] ENDOSCOPY;  Service: Cardiovascular;  Laterality: N/A;   Social History   Social History  . Marital status: Married    Spouse name: N/A  . Number of children: 2  . 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 Smoking status: Never Smoker  . Smokeless tobacco: Never Used  . Alcohol use 1.2 oz/week    2 Glasses of wine per week  . Drug use: No  . Sexual activity: Yes    Birth control/ protection: Post-menopausal   Other Topics Concern  . None   Social History Narrative  . None   Allergies  Allergen Reactions  . Septra [Bactrim] Rash  . Sulfa Antibiotics Rash   Family History  Problem Relation Age of Onset  . CAD Father 15    died age 63  . CAD Brother 73    small vessel disease  . Atrial fibrillation Mother   . Diabetes Mother   .  Hypertension Mother   . CAD Cousin     paternal  . CAD Paternal Grandfather     early onset  . Colon cancer Neg Hx     Past medical history, social, surgical and family history all reviewed in electronic medical record.  No pertanent information unless stated regarding to the chief complaint.   Review of Systems:Review of systems updated and as accurate as of 01/22/17  No headache, visual changes, nausea, vomiting, diarrhea, constipation, dizziness, abdominal pain, skin rash, fevers, chills, night sweats, weight loss, swollen lymph nodes, body aches, joint swelling, muscle aches, chest pain, shortness of breath, mood changes.   Objective  Blood pressure 132/84, pulse 78, height 5' 9.5" (1.765 m), weight 209 lb (94.8 kg). Systems examined below as of  01/22/17   General: No apparent distress alert and oriented x3 mood and affect normal, dressed appropriately.  HEENT: Pupils equal, extraocular movements intact  Respiratory: Patient's speak in full sentences and does not appear short of breath  Cardiovascular: No lower extremity edema, non tender, no erythema  Skin: Warm dry intact with no signs of infection or rash on extremities or on axial skeleton.  Abdomen: Soft nontender  Neuro: Cranial nerves II through XII are intact, neurovascularly intact in all extremities with 2+ DTRs and 2+ pulses.  Lymph: No lymphadenopathy of posterior or anterior cervical chain or axillae bilaterally.  Gait Very mild antalgic gait MSK:  Non tender with full range of motion and good stability and symmetric strength and tone of shoulders, elbows, wrist, knee and ankles bilaterally.  Right hip shows the patient has tenderness to palpation over the greater trochanteric area. Positive Faber test. Negative straight leg test. Neurovascularly intact distally. Full strength in the symmetric compared to the contralateral side.  Exam shows the patient does have severe breakdown of the transverse arch. Patient's right toe shows all extremities noted. Mild swelling over the first metatarsal joint. No pain over the navicular pain. Mild tightness of the ankles bilaterally but neurovascularly intact distally.    Impression and Recommendations:     This case required medical decision making of moderate complexity.      Note: This dictation was prepared with Dragon dictation along with smaller phrase technology. Any transcriptional errors that result from this process are unintentional.

## 2017-01-22 NOTE — Assessment & Plan Note (Signed)
Patient does have what appears to be more of a capsulitis of the first toe. We discussed icing regimen, topical anti-inflammatories, proper shoes. Worsening symptoms we'll consider injection at follow-up in 4 weeks

## 2017-01-22 NOTE — Assessment & Plan Note (Signed)
Discussed home exercises, icing protocol, which activities doing which ones to avoid. We discussed sleeping mechanics. Patient work with Product/process development scientist to learn home exercises in greater detail. Patient come back in 4 weeks and if continuing have pain we'll consider injection as well as potentially formal physical therapy.

## 2017-01-22 NOTE — Patient Instructions (Signed)
Good to see you  Ice 20 minutes 2 times daily. Usually after activity and before bed. pennsaid pinkie amount topically 2 times daily as needed.  Wear more flat rigid sole shoes for now, no heels if you can  Still avid being barefoot.  For the hip exercises 3 times a week.  See me again in 4 weeks if not perfect  Thank you for coming in !!!!

## 2017-01-24 DIAGNOSIS — Z1231 Encounter for screening mammogram for malignant neoplasm of breast: Secondary | ICD-10-CM | POA: Diagnosis not present

## 2017-01-24 LAB — HM MAMMOGRAPHY

## 2017-01-27 ENCOUNTER — Encounter: Payer: Self-pay | Admitting: Internal Medicine

## 2017-01-27 NOTE — Progress Notes (Unsigned)
Results entered and sent to scan  

## 2017-01-30 ENCOUNTER — Telehealth: Payer: Self-pay | Admitting: Endocrinology

## 2017-01-30 NOTE — Telephone Encounter (Signed)
Has questions about medtronic pump and dexcom pump  about how to download it to the computer and send to the office. Also, on how to use it and the hours.Please call patient to advise. Okay to leave a detailed message.

## 2017-02-03 ENCOUNTER — Other Ambulatory Visit: Payer: Medicare Other

## 2017-02-03 NOTE — Telephone Encounter (Signed)
Message left on her home phone to call [employer] telephone number on the back of her pump for help with signing up to [place].  They will help her to do this with directions to download.  She was then told to call us with her user name and password.

## 2017-02-04 ENCOUNTER — Other Ambulatory Visit: Payer: Self-pay | Admitting: Endocrinology

## 2017-02-04 ENCOUNTER — Other Ambulatory Visit (INDEPENDENT_AMBULATORY_CARE_PROVIDER_SITE_OTHER): Payer: Medicare Other

## 2017-02-04 DIAGNOSIS — E1065 Type 1 diabetes mellitus with hyperglycemia: Secondary | ICD-10-CM

## 2017-02-04 LAB — LIPID PANEL
CHOLESTEROL: 137 mg/dL (ref 0–200)
HDL: 55.8 mg/dL (ref >39.00)
LDL Cholesterol: 69 mg/dL (ref 0–99)
NonHDL: 81.17
TRIGLYCERIDES: 60 mg/dL (ref 0.0–149.0)
Total CHOL/HDL Ratio: 2
VLDL: 12 mg/dL (ref 0.0–40.0)

## 2017-02-04 LAB — MICROALBUMIN / CREATININE URINE RATIO
CREATININE, U: 108.3 mg/dL
Microalb Creat Ratio: 0.6 mg/g (ref 0.0–30.0)

## 2017-02-04 LAB — BASIC METABOLIC PANEL
BUN: 13 mg/dL (ref 6–23)
CHLORIDE: 105 meq/L (ref 96–112)
CO2: 31 mEq/L (ref 19–32)
Calcium: 9.5 mg/dL (ref 8.4–10.5)
Creatinine, Ser: 0.72 mg/dL (ref 0.40–1.20)
GFR: 85.33 mL/min (ref >60.00)
Glucose, Bld: 70 mg/dL (ref 70–99)
Potassium: 4.2 mEq/L (ref 3.5–5.1)
Sodium: 140 mEq/L (ref 135–145)

## 2017-02-05 LAB — FRUCTOSAMINE: Fructosamine: 292 umol/L — ABNORMAL HIGH (ref 0–285)

## 2017-02-06 ENCOUNTER — Encounter: Payer: Self-pay | Admitting: Endocrinology

## 2017-02-06 ENCOUNTER — Ambulatory Visit (INDEPENDENT_AMBULATORY_CARE_PROVIDER_SITE_OTHER): Payer: Medicare Other | Admitting: Endocrinology

## 2017-02-06 VITALS — BP 132/82 | HR 74 | Ht 69.5 in | Wt 204.0 lb

## 2017-02-06 DIAGNOSIS — E1065 Type 1 diabetes mellitus with hyperglycemia: Secondary | ICD-10-CM | POA: Diagnosis not present

## 2017-02-06 NOTE — Progress Notes (Signed)
Patient ID: CAROLINE LONGIE, female   DOB: 1946/02/10, 71 y.o.   MRN: [id number]   Reason for visit: DIABETES followup.  Diagnosis: Type 1 diabetes mellitus, date of diagnosis: 1979.    HISTORY of present illness:   Insulin Pump: CURRENT brand: [id number]  The pump settings are: Midnight = 0.9,  2 AM  0.8, 5 AM = 1.9, 8 AM = 1.7, 11 AM = 1.0; 5 PM = 2.1, 7 PM = 2.6 and 10 PM = 1.7.   Total daily insulin = 44.5 units, 80% basal   Carbohydrate ratio 1:15 breakfast, lunch and 1:7 at dinner; sensitivity 1: 35, 30 after 6 AM; target  110-30  Insulin on board indicator on, duration 4 hrs   DIABETES: She has had long-standing diabetes with A1c levels usually above target. In 10/13 ACTOS was restarted because of her large insulin requirement and diabetic dyslipidemia.  Her blood sugars improved significantly reduced.  Also has been taking metformin as insulin sensitizer  RECENT history:  Her A1c on the last visit was 8.3  Current blood sugar patterns from pump and Sensor download and interpretation of her CGM download:   Her Sensor has been active only for about  70% of the time  AVERAGE blood sugar 172 with standard deviation 88, improved from before  Although she was having higher readings after supper she is not showing general decline in blood sugars late at night  OVERNIGHT blood sugars have been quite variable but at least on 3 occasions her sugars have been significantly low in the early part of the night  Not clear if she has excessive basal but she did have prolonged hypoglycemia during the night after taking a correction bolus of 3 units for blood sugar of 245 at night once  She apparently does not always respond to the alarm of her DexCom when it gets low at night  FASTING blood sugars are relatively variable although recently mostly within target range  POSTPRANDIAL readings are not showing any consistent pattern but probably on lower than pre-meal blood sugars in the  evenings on an average  She tends to have a significant REBOUND of blood sugars after having a low sugar, some of this may be from overcompensation  LOWEST blood sugars are around midnight but on an average the lowest blood sugars are lowest around 7 PM  Has had low sugars only once in the early afternoon possibly when she was more active  PUMP management: She is still not entering her carbohydrate for several meals and frequently with bolus without entering her blood sugar  Hypoglycemic awareness: Has symptoms of feeling tired, headache, sweaty. Variable recognition threshold present  Sometimes may not be aware during the night and will be notified by her sensor that the sugar is low  She has symptoms when blood glucose is less than 60. Uses glucose tablets for treatment when not at home but otherwise eats a snack .    DIET has been usually fairly controlled with carbohydrate and fat intake, tends to eat out on weekends Again her mealtimes are variable. Frequently will not eat breakfast.  Usually trying to have low fat meals  Food preferences: Yogurt And cottage cheese for breakfast usually.   Physical activity: exercise: She is not walking  Historically after exercise blood sugar will get lower in 1-2 hrs but sometimes it may get lower while walking and she carries glucose tablets with her. .  Certified Diabetes Educator visit: Most recent: 8/12.  Wt Readings from Last 3 Encounters:  02/06/17 204 lb (92.5 kg)  01/22/17 209 lb (94.8 kg)  12/31/16 204 lb (92.5 kg)    LABS:  Lab Results  Component Value Date   HGBA1C 8.3 (H) 12/20/2016   HGBA1C 7.1 (H) 09/09/2016   HGBA1C 7.0 (H) 06/10/2016   Lab Results  Component Value Date   MICROALBUR <0.7 02/04/2017   LDLCALC 69 02/04/2017   CREATININE 0.72 02/04/2017    Lab on 02/04/2017  Component Date Value Ref Range Status  . Fructosamine 02/04/2017 292* 0 - 285 umol/L Final   Comment: Published reference interval for  apparently healthy subjects between age 12 and 35 is 66 - 285 umol/L and in a poorly controlled diabetic population is 228 - 563 umol/L with a mean of 396 umol/L.   Marland Kitchen Sodium 02/04/2017 140  135 - 145 mEq/L Final  . Potassium 02/04/2017 4.2  3.5 - 5.1 mEq/L Final  . Chloride 02/04/2017 105  96 - 112 mEq/L Final  . CO2 02/04/2017 31  19 - 32 mEq/L Final  . Glucose, Bld 02/04/2017 70  70 - 99 mg/dL Final  . BUN 02/04/2017 13  6 - 23 mg/dL Final  . Creatinine, Ser 02/04/2017 0.72  0.40 - 1.20 mg/dL Final  . Calcium 02/04/2017 9.5  8.4 - 10.5 mg/dL Final  . GFR 02/04/2017 85.33  >60.00 mL/min Final  . Microalb, Ur 02/04/2017 <0.7  0.0 - 1.9 mg/dL Final  . Creatinine,U 02/04/2017 108.3  mg/dL Final  . Microalb Creat Ratio 02/04/2017 0.6  0.0 - 30.0 mg/g Final  . Cholesterol 02/04/2017 137  0 - 200 mg/dL Final  . Triglycerides 02/04/2017 60.0  0.0 - 149.0 mg/dL Final  . HDL 02/04/2017 55.80  >39.00 mg/dL Final  . VLDL 02/04/2017 12.0  0.0 - 40.0 mg/dL Final  . LDL Cholesterol 02/04/2017 69  0 - 99 mg/dL Final  . Total CHOL/HDL Ratio 02/04/2017 2   Final  . NonHDL 02/04/2017 81.17   Final    Allergies as of 02/06/2017      Reactions   Septra [bactrim] Rash   Sulfa Antibiotics Rash      Medication List       Accurate as of 02/06/17  9:10 AM. Always use your most recent med list.          aspirin 325 MG tablet Take 325 mg by mouth daily.   CALCIUM + D PO Take 600 mg by mouth 2 (two) times daily.   cholecalciferol 1000 units tablet Commonly known as:  VITAMIN D Take 1,000 Units by mouth daily.   clopidogrel 75 MG tablet Commonly known as:  PLAVIX Take 1 tablet (75 mg total) by mouth daily.   glucose blood test strip Commonly known as:  BAYER CONTOUR TEST Use as instructed to check sugar 6 times daily. Dx Code E10.65   ibuprofen 200 MG tablet Commonly known as:  ADVIL,MOTRIN Take 200 mg by mouth every 6 (six) hours as needed.   insulin aspart 100 UNIT/ML  injection Commonly known as:  NOVOLOG Use Max 200 units per day in insulin pump   insulin lispro 100 UNIT/ML injection Commonly known as:  HUMALOG USE MAX 200 UNITS PER DAY WITH INSULIN PUMP   insulin pump Soln Inject into the skin. Novolog Vial, max 200 units per day with pump   metFORMIN 500 MG 24 hr tablet Commonly known as:  GLUCOPHAGE-XR Take 2 tablets by mouth  daily   metoprolol succinate 100 MG 24  hr tablet Commonly known as:  TOPROL-XL TAKE 1 TABLET (100 MG TOTAL) BY MOUTH DAILY.   niacin 1000 MG CR tablet Commonly known as:  NIASPAN Take 1 tablet by mouth at  bedtime   nitroGLYCERIN 0.4 MG SL tablet Commonly known as:  NITROSTAT Place 1 tablet (0.4 mg total) under the tongue every 5 (five) minutes as needed for chest pain.   pioglitazone 15 MG tablet Commonly known as:  ACTOS Take 1 tablet (15 mg total) by mouth daily.   rosuvastatin 40 MG tablet Commonly known as:  CRESTOR Take 1 tablet (40 mg total) by mouth daily.   tretinoin 0.1 % cream Commonly known as:  RETIN-A Apply 1 application topically at bedtime.       Allergies:  Allergies  Allergen Reactions  . Septra [Bactrim] Rash  . Sulfa Antibiotics Rash    Past Medical History:  Diagnosis Date  . Arthritis   . Atrial flutter ([place])    a. s/p TEE/DCCV 08/16/13 (normal LV function, no LAA thrombus).b. s/p ablation by Dr Lovena Le 09-23-2013  . Coronary artery disease    2009 LAD Promus stent  . Fibroid   . Hyperlipidemia   . Osteopenia   . Type 1 diabetes mellitus on insulin therapy ([place])   . Valvular heart disease    a. Mild MR/TR by TEE 08/2013.    Past Surgical History:  Procedure Laterality Date  . ABLATION  09-23-2013   CTI by Dr Lovena Le  . APPENDECTOMY    . ATRIAL FLUTTER ABLATION N/A 09/23/2013   Procedure: ATRIAL FLUTTER ABLATION;  Surgeon: Evans Lance, MD;  Location: [facility] CATH LAB;  Service: Cardiovascular;  Laterality: N/A;  . CARDIOVERSION N/A 08/16/2013   Procedure:  CARDIOVERSION;  Surgeon: Thayer Headings, MD;  Location: [place];  Service: Cardiovascular;  Laterality: N/A;  spoke with [name]  . CATARACT EXTRACTION Bilateral   . COLONOSCOPY    . CORONARY ANGIOPLASTY WITH STENT PLACEMENT    . MYOMECTOMY  1977  . PELVIC LAPAROSCOPY    . TEE WITHOUT CARDIOVERSION N/A 08/16/2013   Procedure: TRANSESOPHAGEAL ECHOCARDIOGRAM (TEE);  Surgeon: Thayer Headings, MD;  Location: [facility] ENDOSCOPY;  Service: Cardiovascular;  Laterality: N/A;    Family History  Problem Relation Age of Onset  . CAD Father 20    died age 4  . CAD Brother 62    small vessel disease  . Atrial fibrillation Mother   . Diabetes Mother   . Hypertension Mother   . CAD Cousin     paternal  . CAD Paternal Grandfather     early onset  . Colon cancer Neg Hx     Social History:  reports that she has never smoked. She has never used smokeless tobacco. She reports that she drinks about 1.2 oz of alcohol per week . She reports that she does not use drugs.  REVIEW of systems:   She has had diabetic dyslipidemia with significantly high LDL particle number and this has been managed with Crestor 40 mg and Niaspan 1000 mg In 3/16 her particle number was below 1000 and Her last level was excellent at 856  Her last LDL is below 70, HDL excellent   Lab Results  Component Value Date   CHOL 137 02/04/2017   HDL 55.80 02/04/2017   LDLCALC 69 02/04/2017   LDLDIRECT 79.0 01/11/2015   TRIG 60.0 02/04/2017   CHOLHDL 2 02/04/2017   She has had atrial arrhythmias treated with metoprolol    BP 132/82  Pulse 74   Ht 5' 9.5" (1.765 m)   Wt 204 lb (92.5 kg)   SpO2 95%   BMI 29.69 kg/m    ASSESSMENT/PLAN:  DIABETES type 1 with fair control, A1c is good at 7% considering difficulty getting consistent control  See history of present illness for detailed discussion of her current blood sugar patterns and management with her pump and Review of her home continuous glucose monitoring    Although her blood sugars are overall relatively lower recently she is getting variable blood sugars with standard deviation 88 Her most significant trend is tendency to low blood sugars late at night and periodically in the early hours of the morning May also be getting low normal readings after her evening bolus  He will sometimes get excessive rebound from low sugars also As discussed above she can do better with entering carbohydrates and blood sugars with her boluses  Recommendations:  She will reduce her basal rate at midnight down to 0.8, at 5 PM down to 2.0 and at 10 PM down to 1.6  Carbohydrate coverage 1:8 at suppertime and sensitivity 1:35 all day  Blood sugar target 1 20-130  Start regular exercise  Again discussed avoiding overtreating with of hypoglycemia  She does need to prevent any prolonged low sugars during the night by responding to the alarms on the DexCom  HYPERLIPIDEMIA: Well controlled, LDL below 70, continue same regimen  Counseling time on subjects discussed above is over 50% of today's 25 minute visit  Domanique Huesman

## 2017-02-11 ENCOUNTER — Other Ambulatory Visit: Payer: Self-pay

## 2017-02-11 MED ORDER — PIOGLITAZONE HCL 15 MG PO TABS: 15.0000 mg | ORAL_TABLET | Freq: Every day | ORAL | 1 refills | Status: DC

## 2017-02-13 ENCOUNTER — Other Ambulatory Visit: Payer: Self-pay

## 2017-02-13 MED ORDER — PIOGLITAZONE HCL 15 MG PO TABS: 15.0000 mg | ORAL_TABLET | Freq: Every day | ORAL | 1 refills | Status: DC

## 2017-02-13 NOTE — Progress Notes (Signed)
[name] [address] [place], [place] [address] Phone: [phone] Subjective:      CC: Right foot pain, right hip pain f/u  Foot pain and swelling  [id number]  [name] is a 71 y.o. female coming in with complaint of Right foot pain-patient was seen previously and did have more of a pain that seemed to be secondary to a capsulitis. Patient states that that completely resolved. As well as patient's right hip pain completely resolved with her doing the exercises.  Patient started having unfortunately more left-sided ankle pain. Started having swelling. 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 Patient does not remember any true injury. Has been walking more with taking care of her puppy. Patient states other than that nothing new. States that the pain is severe. Stopping her from even walking somewhat. Has been taking ibuprofen with very minimal benefit. Concerned because swelling seems to be worsening overall. Paresis severity pain as 9 out of 10.     Past Medical History:  Diagnosis Date  . Arthritis   . Atrial flutter ([place])    a. s/p TEE/DCCV 08/16/13 (normal LV function, no LAA thrombus).b. s/p ablation by Dr Lovena Le 09-23-2013  . Coronary artery disease    2009 LAD Promus stent  . Fibroid   . Hyperlipidemia   . Osteopenia   . Type 1 diabetes mellitus on insulin therapy ([place])   . Valvular heart disease    a. Mild MR/TR by TEE 08/2013.   Past Surgical History:  Procedure Laterality Date  . ABLATION  09-23-2013   CTI by Dr Lovena Le  . APPENDECTOMY    . ATRIAL FLUTTER ABLATION N/A 09/23/2013   Procedure: ATRIAL FLUTTER ABLATION;  Surgeon: Evans Lance, MD;  Location: [facility] CATH LAB;  Service: Cardiovascular;  Laterality: N/A;  . CARDIOVERSION N/A 08/16/2013   Procedure: CARDIOVERSION;  Surgeon: Thayer Headings, MD;  Location: [place];  Service: Cardiovascular;  Laterality: N/A;  spoke with [name]  .  CATARACT EXTRACTION Bilateral   . COLONOSCOPY    . CORONARY ANGIOPLASTY WITH STENT PLACEMENT    . MYOMECTOMY  1977  . PELVIC LAPAROSCOPY    . TEE WITHOUT CARDIOVERSION N/A 08/16/2013   Procedure: TRANSESOPHAGEAL ECHOCARDIOGRAM (TEE);  Surgeon: Thayer Headings, MD;  Location: [facility] ENDOSCOPY;  Service: Cardiovascular;  Laterality: N/A;   Social History   Social History  . Marital status: Married    Spouse name: N/A  . Number of children: 2  . 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 Smoking status: Never Smoker  . Smokeless tobacco: Never Used  . Alcohol use 1.2 oz/week    2 Glasses of wine per week  . Drug use: No  . Sexual activity: Yes    Birth control/ protection: Post-menopausal   Other Topics Concern  . None   Social History Narrative  . None   Allergies  Allergen Reactions  . Septra [Bactrim] Rash  . Sulfa Antibiotics Rash   Family History  Problem Relation Age of Onset  . CAD Father 33    died age 22  . CAD Brother 87    small vessel disease  . Atrial fibrillation Mother   . Diabetes Mother   . Hypertension Mother   . CAD Cousin     paternal  . CAD Paternal Grandfather  early onset  . Colon cancer Neg Hx     Past medical history, social, surgical and family history all reviewed in electronic medical record.  No pertanent information unless stated regarding to the chief complaint.   Review of Systems: No headache, visual changes, nausea, vomiting, diarrhea, constipation, dizziness, abdominal pain, skin rash, fevers, chills, night sweats, weight loss, swollen lymph nodes, body aches,muscle aches, chest pain, shortness of breath, mood changes.  Positive joint swelling   Objective  Blood pressure (!) 144/80, pulse 66, resp. rate 16, weight 205 lb (93 kg), SpO2 97 %.  Systems examined below as of 02/14/17 General: NAD A&O x3 mood, affect normal  HEENT: Pupils equal, extraocular  movements intact no nystagmus Respiratory: not short of breath at rest or with speaking Cardiovascular: No lower extremity edema, non tender Skin: Warm dry intact with no signs of infection or rash on extremities or on axial skeleton. Abdomen: Soft nontender, no masses Neuro: Cranial nerves  intact, neurovascularly intact in all extremities with 2+ DTRs and 2+ pulses. Lymph: No lymphadenopathy appreciated today  Gait Severely antalgic gait  MSK: Non tender with full range of motion and good stability and symmetric strength and tone of shoulders, elbows, wrist,  knee hips and bilaterally.  Mild arthritic changes of multiple joints..  Ankle: Left Severe swelling Significant limitation in range of motion lacking the last 10 of dorsiflexion and plantar flexion. Strength is 4/5 in all directions. Stable lateral and medial ligaments; squeeze test and kleiger test unremarkable; Severe tenderness over the talar dome. Worsening pain over the lateral aspect of the foot in the fifth and fourth metatarsals. Mild pain over the navicular prominence Mild pain over both malleoli No sign of peroneal tendon subluxations or tenderness to palpation Negative tarsal tunnel tinel's Positive Thompson Able to walk 4 steps. Contralateral ankle has some mild arthritic changes but no tenderness today.   MSK US performed of: Left ankle This study was ordered, performed, and interpreted by Charlann Boxer D.O.  Foot/Ankle:   All structures visualized.   Talar dome arthritic changes Ankle mortise without effusion. Severe soft tissue swelling noted Patient's fourth and fifth metatarsal do not have any true bone or cortical defect noted. Patient does have severe hypoechoic changes and increasing Doppler flow and within the vicinity. Moderate midfoot arthritis noted. Significant engorgement of vascularity on the lateral aspect the ankle.  IMPRESSION:  Possible stress reaction of the fourth and fifth metatarsal first  possible arthritic flare   Impression and Recommendations:     This case required medical decision making of moderate complexity.      Note: This dictation was prepared with Dragon dictation along with smaller phrase technology. Any transcriptional errors that result from this process are unintentional.

## 2017-02-14 ENCOUNTER — Ambulatory Visit (INDEPENDENT_AMBULATORY_CARE_PROVIDER_SITE_OTHER): Payer: Medicare Other | Admitting: Family Medicine

## 2017-02-14 ENCOUNTER — Encounter: Payer: Self-pay | Admitting: Family Medicine

## 2017-02-14 ENCOUNTER — Other Ambulatory Visit (INDEPENDENT_AMBULATORY_CARE_PROVIDER_SITE_OTHER): Payer: Medicare Other

## 2017-02-14 ENCOUNTER — Ambulatory Visit (HOSPITAL_COMMUNITY)
Admission: RE | Admit: 2017-02-14 | Discharge: 2017-02-14 | Disposition: A | Discharge status: Home / Self Care | Payer: Medicare Other | Source: Ambulatory Visit | Attending: Cardiology | Admitting: Cardiology

## 2017-02-14 ENCOUNTER — Ambulatory Visit: Payer: Self-pay

## 2017-02-14 VITALS — BP 144/80 | HR 66 | Resp 16 | Wt 205.0 lb

## 2017-02-14 DIAGNOSIS — I2510 Atherosclerotic heart disease of native coronary artery without angina pectoris: Secondary | ICD-10-CM | POA: Diagnosis not present

## 2017-02-14 DIAGNOSIS — M25472 Effusion, left ankle: Secondary | ICD-10-CM | POA: Diagnosis not present

## 2017-02-14 DIAGNOSIS — E785 Hyperlipidemia, unspecified: Secondary | ICD-10-CM | POA: Insufficient documentation

## 2017-02-14 DIAGNOSIS — M79672 Pain in left foot: Secondary | ICD-10-CM

## 2017-02-14 DIAGNOSIS — E119 Type 2 diabetes mellitus without complications: Secondary | ICD-10-CM | POA: Insufficient documentation

## 2017-02-14 DIAGNOSIS — E559 Vitamin D deficiency, unspecified: Secondary | ICD-10-CM

## 2017-02-14 LAB — URIC ACID: Uric Acid, Serum: 4.4 mg/dL (ref 2.4–7.0)

## 2017-02-14 LAB — D-DIMER, QUANTITATIVE: D-Dimer, Quant: 0.6 mcg/mL FEU — ABNORMAL HIGH (ref <0.50)

## 2017-02-14 LAB — C-REACTIVE PROTEIN: CRP: 0.1 mg/dL — AB (ref 0.5–20.0)

## 2017-02-14 LAB — VITAMIN D 25 HYDROXY (VIT D DEFICIENCY, FRACTURES): VITD: 55.38 ng/mL (ref 30.00–100.00)

## 2017-02-14 LAB — SEDIMENTATION RATE: Sed Rate: 8 mm/hr (ref 0–30)

## 2017-02-14 MED ORDER — TRAMADOL HCL 50 MG PO TABS: 50.0000 mg | ORAL_TABLET | Freq: Three times a day (TID) | ORAL | 0 refills | Status: DC | PRN

## 2017-02-14 MED ORDER — ALENDRONATE SODIUM 70 MG PO TABS: 70.0000 mg | ORAL_TABLET | ORAL | 11 refills | Status: DC

## 2017-02-14 NOTE — Patient Instructions (Addendum)
Good to see you  We will get labs today and the doppler test.  Back to the boot.  ICe at this time.  If in pain can take duexis up to 2 times a day  Tramadol if in severe pain COntinue the once weekly vitamin DD Lets start the fosamax as well again a once a week for 8 weeks.  See me again in 1 week (OK to double book)

## 2017-02-14 NOTE — Progress Notes (Signed)
Pre-visit discussion using our clinic review tool. No additional management support is needed unless otherwise documented below in the visit note.  

## 2017-02-14 NOTE — Assessment & Plan Note (Signed)
Patient is more of a left ankle swelling. There seems to be somewhat of a noncompressible vein and the ankle and in Doppler has been ordered. We discussed with patient doing otherwise his could be another stress reaction. Went to avoid any avascular necrosis. We'll start the Fosamax that did seem to considerably help last time. We discussed icing regimen, we discussed home exercises. Patient has had now to potential stress fractures but patient's bone density was normal. Continue the vitamin D. Patient will go back to a Cam Walker. Follow-up again in one week to make sure patient is responding appropriately.

## 2017-02-17 ENCOUNTER — Encounter: Payer: Self-pay | Admitting: Family Medicine

## 2017-02-17 LAB — ANA: ANA: NEGATIVE

## 2017-02-18 ENCOUNTER — Telehealth: Payer: Self-pay | Admitting: Endocrinology

## 2017-02-18 NOTE — Telephone Encounter (Signed)
[place] calling to check status of the pump supplies paperwork

## 2017-02-18 NOTE — Telephone Encounter (Signed)
This was faxed today 02/18/17

## 2017-02-19 ENCOUNTER — Ambulatory Visit: Payer: Medicare Other | Admitting: Family Medicine

## 2017-02-21 ENCOUNTER — Encounter: Payer: Self-pay | Admitting: Family Medicine

## 2017-02-21 ENCOUNTER — Ambulatory Visit: Payer: Self-pay

## 2017-02-21 ENCOUNTER — Ambulatory Visit (INDEPENDENT_AMBULATORY_CARE_PROVIDER_SITE_OTHER): Payer: Medicare Other | Admitting: Family Medicine

## 2017-02-21 ENCOUNTER — Telehealth: Payer: Self-pay | Admitting: Endocrinology

## 2017-02-21 VITALS — BP 132/72 | HR 67 | Resp 16 | Wt 200.5 lb

## 2017-02-21 DIAGNOSIS — M84375D Stress fracture, left foot, subsequent encounter for fracture with routine healing: Secondary | ICD-10-CM | POA: Diagnosis not present

## 2017-02-21 DIAGNOSIS — M79672 Pain in left foot: Secondary | ICD-10-CM

## 2017-02-21 NOTE — Patient Instructions (Signed)
Good to see you  Alvera Singh is your friend.  Bites and pints.  Continue the ibuprofen  Boot another 2 weeks See me again in 3 weeks.

## 2017-02-21 NOTE — Progress Notes (Signed)
[name] [address] [place], [place] [address] Phone: [phone] Subjective:      CC: Left foot pain follow-up  [id number]  [name] is a 71 y.o. female coming in with complaint of left foot pain. Patient unfortunately had what appeared to be more of a metatarsal stress fracture of the left foot again. Discussed with patient about putting in a boot. Has been doing it for one week. Had significant amount of swelling so was unable to see it in greater detail. Patient states disease better but not completely gone yet. Still has not been able to put much weight on it outside of the boot. Patient did have Doppler done to rule out clot which was unremarkable.     Past Medical History:  Diagnosis Date  . Arthritis   . Atrial flutter ([place])    a. s/p TEE/DCCV 08/16/13 (normal LV function, no LAA thrombus).b. s/p ablation by Dr Lovena Le 09-23-2013  . Coronary artery disease    2009 LAD Promus stent  . Fibroid   . Hyperlipidemia   . Osteopenia   . Type 1 diabetes mellitus on insulin therapy ([place])   . Valvular heart disease    a. Mild MR/TR by TEE 08/2013.   Past Surgical History:  Procedure Laterality Date  . ABLATION  09-23-2013   CTI by Dr Lovena Le  . APPENDECTOMY    . ATRIAL FLUTTER ABLATION N/A 09/23/2013   Procedure: ATRIAL FLUTTER ABLATION;  Surgeon: Evans Lance, MD;  Location: [facility] CATH LAB;  Service: Cardiovascular;  Laterality: N/A;  . CARDIOVERSION N/A 08/16/2013   Procedure: CARDIOVERSION;  Surgeon: Thayer Headings, MD;  Location: [place];  Service: Cardiovascular;  Laterality: N/A;  spoke with [name]  . CATARACT EXTRACTION Bilateral   . COLONOSCOPY    . CORONARY ANGIOPLASTY WITH STENT PLACEMENT    . MYOMECTOMY  1977  . PELVIC LAPAROSCOPY    . TEE WITHOUT CARDIOVERSION N/A 08/16/2013   Procedure: TRANSESOPHAGEAL ECHOCARDIOGRAM (TEE);  Surgeon: Thayer Headings, MD;  Location: [facility] ENDOSCOPY;  Service: Cardiovascular;  Laterality:  N/A;   Social History   Social History  . Marital status: Married    Spouse name: N/A  . Number of children: 2  . 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 Smoking status: Never Smoker  . Smokeless tobacco: Never Used  . Alcohol use 1.2 oz/week    2 Glasses of wine per week  . Drug use: No  . Sexual activity: Yes    Birth control/ protection: Post-menopausal   Other Topics Concern  . None   Social History Narrative  . None   Allergies  Allergen Reactions  . Septra [Bactrim] Rash  . Sulfa Antibiotics Rash   Family History  Problem Relation Age of Onset  . CAD Father 65    died age 64  . CAD Brother 43    small vessel disease  . Atrial fibrillation Mother   . Diabetes Mother   . Hypertension Mother   . CAD Cousin     paternal  . CAD Paternal Grandfather     early onset  . Colon cancer Neg Hx     Past medical history, social, surgical and family history all reviewed in electronic medical record.  No pertanent information unless stated regarding to the chief complaint.   Review of Systems: No headache, visual changes,  nausea, vomiting, diarrhea, constipation, dizziness, abdominal pain, skin rash, fevers, chills, night sweats, weight loss, swollen lymph nodes, body aches, joint swelling, muscle aches, chest pain, shortness of breath, mood changes.  Less swelling than previous  Objective  Blood pressure 132/72, pulse 67, resp. rate 16, weight 200 lb 8 oz (90.9 kg), SpO2 98 %.  Systems examined below as of 02/21/17 General: NAD A&O x3 mood, affect normal  HEENT: Pupils equal, extraocular movements intact no nystagmus Respiratory: not short of breath at rest or with speaking Cardiovascular: No lower extremity edema, non tender Skin: Warm dry intact with no signs of infection or rash on extremities or on axial skeleton. Abdomen: Soft nontender, no masses Neuro: Cranial nerves  intact,  neurovascularly intact in all extremities with 2+ DTRs and 2+ pulses. Lymph: No lymphadenopathy appreciated today  Gait Antalgic gait MSK: Non tender with full range of motion and good stability and symmetric strength and tone of shoulders, elbows, wrist,  knee hips and bilaterally.  Mild arthritic changes of multiple joints  Ankle: Left Trace swelling still noted mostly over the lateral ankle in the lateral foot Improvement in range of motion but still lacking the last 5 of dorsiflexion Strength is 4/5 in all directions. Stable lateral and medial ligaments; squeeze test and kleiger test unremarkable; Mild tenderness over the talar dome and more over the fourth and fifth  Mild pain over the navicular prominence No sign of peroneal tendon subluxations or tenderness to palpation Negative tarsal tunnel tinel's Positive Thompson Able to walk 4 steps. Contralateral ankle has some mild arthritic changes but no tenderness today.   MSK US performed of: Left foot  This study was ordered, performed, and interpreted by Charlann Boxer D.O.  Foot/Ankle:   Limited ultrasound patient's fourth and fifth metatarsals do show patient does have what appears to be a cortical defect noted. Patient's swelling is improved but increasing Doppler flow in the area.  IMPRESSION:  Cortical defect of the proximal fourth and fifth metatarsals noted.   Impression and Recommendations:     This case required medical decision making of moderate complexity.      Note: This dictation was prepared with Dragon dictation along with smaller phrase technology. Any transcriptional errors that result from this process are unintentional.

## 2017-02-21 NOTE — Telephone Encounter (Signed)
[facility] Calling [facility] on the status of a form fax to our office, [place] patient supplies please advise

## 2017-02-21 NOTE — Assessment & Plan Note (Signed)
Patient has a recurrent stress fracture of the fourth and fifth metatarsal encourage her to continue the vitamin D as well as the Fosamax. Patient will try to do these and we'll continue to monitor. We discussed worsening symptoms remained consider injection other areas but it does not appear the patient has a capsulitis or synovitis at this moment. Patient likely be on vitamin D indefinitely. May need to repeat possible bone scan with her having repetitive stress fractures. Follow-up again in 3 weeks

## 2017-02-21 NOTE — Progress Notes (Signed)
Pre-visit discussion using our clinic review tool. No additional management support is needed unless otherwise documented below in the visit note.  

## 2017-02-25 NOTE — Telephone Encounter (Signed)
Done

## 2017-03-03 NOTE — Telephone Encounter (Signed)
[facility] haven't received the form for patient diabetic supplys, Fax [phone]

## 2017-03-03 NOTE — Telephone Encounter (Signed)
Form from 02/18/2017 resubmitted to [facility].

## 2017-03-12 ENCOUNTER — Other Ambulatory Visit: Payer: Self-pay | Admitting: *Deleted

## 2017-03-12 MED ORDER — METOPROLOL SUCCINATE ER 100 MG PO TB24: ORAL_TABLET | ORAL | 2 refills | Status: DC

## 2017-03-19 ENCOUNTER — Ambulatory Visit: Payer: Medicare Other | Admitting: Family Medicine

## 2017-03-20 ENCOUNTER — Encounter: Payer: Self-pay | Admitting: Family Medicine

## 2017-03-20 ENCOUNTER — Ambulatory Visit (INDEPENDENT_AMBULATORY_CARE_PROVIDER_SITE_OTHER): Payer: Medicare Other | Admitting: Family Medicine

## 2017-03-20 DIAGNOSIS — M84375D Stress fracture, left foot, subsequent encounter for fracture with routine healing: Secondary | ICD-10-CM | POA: Diagnosis not present

## 2017-03-20 MED ORDER — VITAMIN D (ERGOCALCIFEROL) 1.25 MG (50000 UNIT) PO CAPS: 50000.0000 [IU] | ORAL_CAPSULE | ORAL | 0 refills | Status: DC

## 2017-03-20 NOTE — Patient Instructions (Signed)
good to see you  Ice is your friend Elevate your foot.  Once weekly for vitamin D for another 4 weeks See em again in 6 weeks if not perfect

## 2017-03-20 NOTE — Progress Notes (Signed)
[name] [address] [place], [place] [address] Phone: [phone] Subjective:      CC: Left foot pain follow-up  GLO:[id number]  JAMISON [name] is a 71 y.o. female coming in with complaint of left foot pain. Patient unfortunately had what appeared to be more of a metatarsal stress fracture of the left foot. Patient states a couple weeks ago she was nearly pain-free. Has finished now the Fosamax. Only doing daily vitamin D. Patient states that unfortunate she is been more on her feet recently. This discussed some mild increase in pain as well as swelling. Taking care of her 69 year old mother.     Past Medical History:  Diagnosis Date  . Arthritis   . Atrial flutter ([place])    a. s/p TEE/DCCV 08/16/13 (normal LV function, no LAA thrombus).b. s/p ablation by Dr Lovena Le 09-23-2013  . Coronary artery disease    2009 LAD Promus stent  . Fibroid   . Hyperlipidemia   . Osteopenia   . Type 1 diabetes mellitus on insulin therapy ([place])   . Valvular heart disease    a. Mild MR/TR by TEE 08/2013.   Past Surgical History:  Procedure Laterality Date  . ABLATION  09-23-2013   CTI by Dr Lovena Le  . APPENDECTOMY    . ATRIAL FLUTTER ABLATION N/A 09/23/2013   Procedure: ATRIAL FLUTTER ABLATION;  Surgeon: Evans Lance, MD;  Location: [facility] CATH LAB;  Service: Cardiovascular;  Laterality: N/A;  . CARDIOVERSION N/A 08/16/2013   Procedure: CARDIOVERSION;  Surgeon: Thayer Headings, MD;  Location: [facility];  Service: Cardiovascular;  Laterality: N/A;  spoke with [name]  . CATARACT EXTRACTION Bilateral   . COLONOSCOPY    . CORONARY ANGIOPLASTY WITH STENT PLACEMENT    . MYOMECTOMY  1977  . PELVIC LAPAROSCOPY    . TEE WITHOUT CARDIOVERSION N/A 08/16/2013   Procedure: TRANSESOPHAGEAL ECHOCARDIOGRAM (TEE);  Surgeon: Thayer Headings, MD;  Location: [facility] ENDOSCOPY;  Service: Cardiovascular;  Laterality: N/A;   Social History   Social History  . Marital status: Married   Spouse name: N/A  . Number of children: 2  . 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 Smoking status: Never Smoker  . Smokeless tobacco: Never Used  . Alcohol use 1.2 oz/week    2 Glasses of wine per week  . Drug use: No  . Sexual activity: Yes    Birth control/ protection: Post-menopausal   Other Topics Concern  . Not on file   Social History Narrative  . No narrative on file   Allergies  Allergen Reactions  . Septra [Bactrim] Rash  . Sulfa Antibiotics Rash   Family History  Problem Relation Age of Onset  . CAD Father 25       died age 51  . CAD Brother 49       small vessel disease  . Atrial fibrillation Mother   . Diabetes Mother   . Hypertension Mother   . CAD Cousin        paternal  . CAD Paternal Grandfather        early onset  . Colon cancer Neg Hx     Past medical history, social, surgical and family history all reviewed in electronic medical record.  No pertanent information unless stated regarding to the chief complaint.   Review of Systems: No headache, visual changes, nausea, vomiting,  diarrhea, constipation, dizziness, abdominal pain, skin rash, fevers, chills, night sweats, weight loss, swollen lymph nodes, body aches, joint swelling, muscle aches, chest pain, shortness of breath, mood changes.    Objective  There were no vitals taken for this visit.  Systems examined below as of 03/20/17 General: NAD A&O x3 mood, affect normal  HEENT: Pupils equal, extraocular movements intact no nystagmus Respiratory: not short of breath at rest or with speaking Cardiovascular: No lower extremity edema, non tender Skin: Warm dry intact with no signs of infection or rash on extremities or on axial skeleton. Abdomen: Soft nontender, no masses Neuro: Cranial nerves  intact, neurovascularly intact in all extremities with 2+ DTRs and 2+ pulses. Lymph: No lymphadenopathy appreciated today    Gait normal with good balance and coordination. Near normal gait MSK: Non tender with full range of motion and good stability and symmetric strength and tone of shoulders, elbows, wrist,  knee hips and ankles bilaterally.    Ankle: Left Trace swelling still noted mostly over the lateral ankle in the lateral foot Full range of motion of ankle Strength is 4+/5 in all directions. Stable lateral and medial ligaments; squeeze test and kleiger test unremarkable; Mild tenderness still over the fourth and fifth metatarsal but improved No sign of peroneal tendon subluxations or tenderness to palpation Negative tarsal tunnel tinel's Positive Thompson Able to walk 4 steps. Contralateral ankle has some mild arthritic changes but no tenderness today.     Impression and Recommendations:     This case required medical decision making of moderate complexity.      Note: This dictation was prepared with Dragon dictation along with smaller phrase technology. Any transcriptional errors that result from this process are unintentional.

## 2017-03-20 NOTE — Assessment & Plan Note (Signed)
Improved at this time. Patient is unable to stay off her feet at this time taking care of her 69 year old mother. Encourage her to attempt to do more elevation and icing. Follow-up again in 6 weeks if pain not completely resolved. Patient will call sooner if worsening symptoms.

## 2017-03-22 ENCOUNTER — Emergency Department (HOSPITAL_COMMUNITY)
Admission: EM | Admit: 2017-03-22 | Discharge: 2017-03-22 | Disposition: A | Discharge status: Home / Self Care | Payer: Medicare Other | Attending: Emergency Medicine | Admitting: Emergency Medicine

## 2017-03-22 ENCOUNTER — Encounter (HOSPITAL_COMMUNITY): Payer: Self-pay | Admitting: Emergency Medicine

## 2017-03-22 ENCOUNTER — Emergency Department (HOSPITAL_COMMUNITY): Payer: Medicare Other

## 2017-03-22 DIAGNOSIS — Z79899 Other long term (current) drug therapy: Secondary | ICD-10-CM | POA: Insufficient documentation

## 2017-03-22 DIAGNOSIS — S82092A Other fracture of left patella, initial encounter for closed fracture: Secondary | ICD-10-CM | POA: Diagnosis not present

## 2017-03-22 DIAGNOSIS — Y9289 Other specified places as the place of occurrence of the external cause: Secondary | ICD-10-CM | POA: Diagnosis not present

## 2017-03-22 DIAGNOSIS — Y9389 Activity, other specified: Secondary | ICD-10-CM | POA: Diagnosis not present

## 2017-03-22 DIAGNOSIS — Z955 Presence of coronary angioplasty implant and graft: Secondary | ICD-10-CM | POA: Insufficient documentation

## 2017-03-22 DIAGNOSIS — S82002A Unspecified fracture of left patella, initial encounter for closed fracture: Secondary | ICD-10-CM | POA: Diagnosis not present

## 2017-03-22 DIAGNOSIS — I2510 Atherosclerotic heart disease of native coronary artery without angina pectoris: Secondary | ICD-10-CM | POA: Insufficient documentation

## 2017-03-22 DIAGNOSIS — W010XXA Fall on same level from slipping, tripping and stumbling without subsequent striking against object, initial encounter: Secondary | ICD-10-CM | POA: Insufficient documentation

## 2017-03-22 DIAGNOSIS — S82042A Displaced comminuted fracture of left patella, initial encounter for closed fracture: Secondary | ICD-10-CM | POA: Diagnosis not present

## 2017-03-22 DIAGNOSIS — Z7982 Long term (current) use of aspirin: Secondary | ICD-10-CM | POA: Insufficient documentation

## 2017-03-22 DIAGNOSIS — I4891 Unspecified atrial fibrillation: Secondary | ICD-10-CM | POA: Diagnosis not present

## 2017-03-22 DIAGNOSIS — S8992XA Unspecified injury of left lower leg, initial encounter: Secondary | ICD-10-CM | POA: Diagnosis present

## 2017-03-22 DIAGNOSIS — Y999 Unspecified external cause status: Secondary | ICD-10-CM | POA: Insufficient documentation

## 2017-03-22 DIAGNOSIS — E109 Type 1 diabetes mellitus without complications: Secondary | ICD-10-CM | POA: Diagnosis not present

## 2017-03-22 DIAGNOSIS — M25562 Pain in left knee: Secondary | ICD-10-CM | POA: Diagnosis not present

## 2017-03-22 MED ORDER — TRAMADOL HCL 50 MG PO TABS: 50.0000 mg | ORAL_TABLET | Freq: Four times a day (QID) | ORAL | 0 refills | Status: DC | PRN

## 2017-03-22 MED ORDER — ONDANSETRON 4 MG PO TBDP
4.0000 mg | ORAL_TABLET | Freq: Once | ORAL | Status: AC
  Administered 2017-03-22: 4 mg via ORAL
  Filled 2017-03-22: qty 1

## 2017-03-22 NOTE — ED Triage Notes (Signed)
Left  Knee pain after falling today , left knee swelling  And painful, has abrasion , last tetanus shot < 5 years, hurst to bear wt

## 2017-03-22 NOTE — ED Provider Notes (Signed)
[facility] DEPT Provider Note   By signing my name below, I, [name], attest that this documentation has been prepared under the direction and in the presence of Audrey Moras, PA-C. Electronically Signed: Bea Kelley, ED Scribe. 03/22/17. 2:55 PM.    History   Chief Complaint Chief Complaint  Patient presents with  . Knee Injury    The history is provided by the patient and medical records. No language interpreter was used.    [name] is a 71 y.o. female with PMHx of atrial flutter, CAD, HLD and T1DM who presents to the Emergency Department complaining of a mechanical fall landing on the left knee earlier this morning due to slipping on wet concrete. She reports associated left knee pain, swelling and an abrasion. She was able to get herself up. Pt states she was seen at a [facility] on [address] and had imaging that revealed a fracture and was instructed to come to the ED. She has applied ice therapy and taken a Tramadol for symptoms with minimal relief. Bearing weight increases the pain. She denies alleviating factors. She denies head trauma, LOC, numbness, tingling or weakness of the LLE, ankle or hip pain. Her last tetanus vaccination was five years ago according to the chart. She is currently on Plavix. She reports allergies to Sulfa medications. She has been seen in the past by Dr. Gardenia Phlegm for a foot fracture.   Past Medical History:  Diagnosis Date  . Arthritis   . Atrial flutter ([place])    a. s/p TEE/DCCV 08/16/13 (normal LV function, no LAA thrombus).b. s/p ablation by Dr Lovena Le 09-23-2013  . Coronary artery disease    2009 LAD Promus stent  . Fibroid   . Hyperlipidemia   . Osteopenia   . Type 1 diabetes mellitus on insulin therapy ([place])   . Valvular heart disease    a. Mild MR/TR by TEE 08/2013.    Patient Active Problem List   Diagnosis Date Noted  . Left ankle swelling 02/14/2017  . Synovitis of toe 01/22/2017  . Trochanteric bursitis,  right hip 01/22/2017  . Travel advice encounter 06/13/2016  . Closed fracture of lateral malleolus of right ankle 02/07/2016  . Routine general medical examination at a health care facility 11/08/2015  . Hyperlipidemia 03/16/2015  . 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 Metatarsal stress fracture of left foot 11/21/2014  . Cerumen impaction 11/11/2014  . Plantar fasciitis, left 08/17/2014  . Other and unspecified hyperlipidemia 07/20/2013  . Uncontrolled type 1 diabetes mellitus ([place]) 05/12/2013  . Chronic insomnia 03/25/2012  . Osteopenia   . Coronary artery disease 04/05/2011  . Atrial flutter ([place]) 04/05/2011    Past Surgical History:  Procedure Laterality Date  . ABLATION  09-23-2013   CTI by Dr Lovena Le  . APPENDECTOMY    . ATRIAL FLUTTER ABLATION N/A 09/23/2013   Procedure: ATRIAL FLUTTER ABLATION;  Surgeon: Evans Lance, MD;  Location: [facility] CATH LAB;  Service: Cardiovascular;  Laterality: N/A;  . CARDIOVERSION N/A 08/16/2013   Procedure: CARDIOVERSION;  Surgeon: Thayer Headings, MD;  Location: [facility];  Service: Cardiovascular;  Laterality: N/A;  spoke with [name]  . CATARACT EXTRACTION Bilateral   . COLONOSCOPY    . CORONARY ANGIOPLASTY WITH STENT PLACEMENT    . MYOMECTOMY  1977  . PELVIC LAPAROSCOPY    . TEE WITHOUT CARDIOVERSION N/A 08/16/2013   Procedure: TRANSESOPHAGEAL ECHOCARDIOGRAM (TEE);  Surgeon: Thayer Headings, MD;  Location: [facility];  Service: Cardiovascular;  Laterality: N/A;    OB History    Gravida Para Term Preterm AB Living   2       2 0   SAB TAB Ectopic Multiple Live Births   2               Home Medications    Prior to Admission medications   Medication Sig Start Date End Date Taking? Authorizing Provider  alendronate (FOSAMAX) 70 MG tablet Take 1 tablet (70 mg total) by mouth every 7 (seven) days. 02/14/17   Lyndal Pulley, DO  aspirin 325 MG tablet Take 325 mg by mouth daily.    [provider]    Calcium Carbonate-Vitamin D (CALCIUM + D PO) Take 600 mg by mouth 2 (two) times daily.     [provider]  cholecalciferol (VITAMIN D) 1000 units tablet Take 1,000 Units by mouth daily.    [provider]  clopidogrel (PLAVIX) 75 MG tablet Take 1 tablet (75 mg total) by mouth daily. 12/11/16   Elayne Snare, MD  glucose blood (BAYER CONTOUR TEST) test strip Use as instructed to check sugar 6 times daily. Dx Code E10.65 07/04/16   Elayne Snare, MD  ibuprofen (ADVIL,MOTRIN) 200 MG tablet Take 200 mg by mouth every 6 (six) hours as needed.    [provider]  Insulin Aspart (INSULIN PUMP) 100 unit/ml SOLN Inject into the skin. Novolog Vial, max 200 units per day with pump    [provider]  insulin aspart (NOVOLOG) 100 UNIT/ML injection Use Max 200 units per day in insulin pump 12/20/16   Elayne Snare, MD  insulin lispro (HUMALOG) 100 UNIT/ML injection USE MAX 200 UNITS PER DAY WITH INSULIN PUMP 12/16/16   Elayne Snare, MD  metFORMIN (GLUCOPHAGE-XR) 500 MG 24 hr tablet Take 2 tablets by mouth  daily 12/16/16   Elayne Snare, MD  metoprolol succinate (TOPROL-XL) 100 MG 24 hr tablet TAKE 1 TABLET (100 MG TOTAL) BY MOUTH DAILY. 03/12/17   [name], Wonda Cheng, MD  niacin (NIASPAN) 1000 MG CR tablet Take 1 tablet by mouth at  bedtime 12/20/16   Elayne Snare, MD  nitroGLYCERIN (NITROSTAT) 0.4 MG SL tablet Place 1 tablet (0.4 mg total) under the tongue every 5 (five) minutes as needed for chest pain. 11/13/16   [name], Wonda Cheng, MD  pioglitazone (ACTOS) 15 MG tablet Take 1 tablet (15 mg total) by mouth daily. 02/13/17   Elayne Snare, MD  rosuvastatin (CRESTOR) 40 MG tablet Take 1 tablet (40 mg total) by mouth daily. 12/20/16   Elayne Snare, MD  traMADol (ULTRAM) 50 MG tablet Take 1 tablet (50 mg total) by mouth every 8 (eight) hours as needed. 02/14/17   Lyndal Pulley, DO  tretinoin (RETIN-A) 0.1 % cream Apply 1 application topically at bedtime.  01/09/15   [provider]  Vitamin D,  Ergocalciferol, (DRISDOL) 50000 units CAPS capsule Take 1 capsule (50,000 Units total) by mouth every 7 (seven) days. 03/20/17   Lyndal Pulley, DO    Family History Family History  Problem Relation Age of Onset  . CAD Father 13       died age 56  . CAD Brother 50       small vessel disease  . Atrial fibrillation Mother   . Diabetes Mother   . Hypertension Mother   . CAD Cousin        paternal  . CAD Paternal Grandfather        early onset  .  Colon cancer Neg Hx     Social History Social History  Substance Use Topics  . Smoking status: Never Smoker  . Smokeless tobacco: Never Used  . Alcohol use 1.2 oz/week    2 Glasses of wine per week     Allergies   Septra [bactrim] and Sulfa antibiotics   Review of Systems Review of Systems  Musculoskeletal: Positive for arthralgias and joint swelling.  Skin: Positive for color change and wound.  Neurological: Negative for syncope, weakness and numbness.     Physical Exam Updated Vital Signs BP (!) 133/53 (BP Location: Left Arm)   Pulse 73   Temp 97.8 F (36.6 C) (Oral)   Resp 16   Wt 203 lb (92.1 kg)   SpO2 97%   BMI 29.55 kg/m   Physical Exam  Constitutional: She is oriented to person, place, and time. She appears well-developed and well-nourished.  HENT:  Head: Normocephalic and atraumatic.  Neck: Normal range of motion.  Cardiovascular: Normal rate.   Pulmonary/Chest: Effort normal.  Musculoskeletal: She exhibits edema and tenderness. She exhibits no deformity.  Left knee with moderate edema noted to anterior knee with small skin abrasion. No deep lacerated. Faint ecchymosis visualized. Decreased ROM secondary to pain.  Neurological: She is alert and oriented to person, place, and time.  Skin: Skin is warm and dry.  Psychiatric: She has a normal mood and affect. Her behavior is normal.  Nursing note and vitals reviewed.    ED Treatments / Results  DIAGNOSTIC STUDIES: Oxygen Saturation is 97% on RA,  normal by my interpretation.   COORDINATION OF CARE: 2:22 PM- Will X-Ray left knee and discuss appropriate plan of care with Dr. Tamera Punt. Offered pain medication but pt declined. Pt verbalizes understanding and agrees to plan.  Medications - No data to display  Labs (all labs ordered are listed, but only abnormal results are displayed) Labs Reviewed - No data to display  EKG  EKG Interpretation None       Radiology Dg Knee Complete 4 Views Left  Result Date: 03/22/2017 CLINICAL DATA:  Pt states that she fell on her carport after slipping up on the wet floor and landed on her left knee. Pt c/o pain in left knee as well as swelling, bruising and lots of discoloration anteriorly. No hx of injuries. EXAM: LEFT KNEE - COMPLETE 4+ VIEW COMPARISON:  None. FINDINGS: There is a comminuted fracture of the patella. Primary fracture is transverse across the mid patella, with the major fracture components displaced 11 mm. Another transverse nondisplaced fracture is noted along the lower pole. No other fractures.  The knee joint is normally aligned. Small to moderate joint effusion.  Anterior soft tissue swelling. IMPRESSION: 1. Mildly comminuted and displaced fracture of the patella. No dislocation. Electronically Signed   By: Lajean Manes M.D.   On: 03/22/2017 14:50    Procedures Procedures (including critical care time)  Medications Ordered in ED Medications - No data to display   Initial Impression / Assessment and Plan / ED Course  I have reviewed the triage vital signs and the nursing notes.  Pertinent labs & imaging results that were available during my care of the patient were reviewed by me and considered in my medical decision making (see chart for details).     Patient presenting after a mechanical fall earlier today resulting in a left knee injury. X-Ray showing a mildly comminuted and displaced fracture of the patella with no dislocation. Pt advised to follow up with  orthopedics.  Patient given knee immobilizer while in ED, conservative therapy recommended and discussed. Patient will be discharged home & is agreeable with above plan. Returns precautions discussed. Pt appears safe for discharge.  Care discussed with Dr. Tamera Punt.    Final Clinical Impressions(s) / ED Diagnoses   Final diagnoses:  Closed sleeve fracture of left patella, initial encounter    New Prescriptions New Prescriptions   TRAMADOL (ULTRAM) 50 MG TABLET    Take 1 tablet (50 mg total) by mouth every 6 (six) hours as needed for moderate pain or severe pain.   I personally performed the services described in this documentation, which was scribed in my presence. The recorded information has been reviewed and is accurate.       Audrey Moras, PA-C 03/22/17 1504    Malvin Johns, MD 03/22/17 [phone]

## 2017-03-22 NOTE — Progress Notes (Signed)
Orthopedic Tech Progress Note Patient Details:  [name] 27-Oct-1946 [id number]  Ortho Devices Type of Ortho Device: Crutches, Knee Immobilizer Ortho Device/Splint Interventions: Application   [name] 03/22/2017, 3:51 PM

## 2017-03-22 NOTE — Discharge Instructions (Signed)
You have a broken knee cap.  Please wear knee immobilizer, use crutches and contact orthopedist next week for further management of your condition.  Take tramadol as needed for pain.

## 2017-03-24 ENCOUNTER — Encounter: Payer: Self-pay | Admitting: Family Medicine

## 2017-03-24 MED ORDER — VITAMIN D (ERGOCALCIFEROL) 1.25 MG (50000 UNIT) PO CAPS: 50000.0000 [IU] | ORAL_CAPSULE | ORAL | 0 refills | Status: DC

## 2017-03-26 ENCOUNTER — Ambulatory Visit
Admission: RE | Admit: 2017-03-26 | Discharge: 2017-03-26 | Disposition: A | Discharge status: Home / Self Care | Payer: Medicare Other | Source: Ambulatory Visit | Attending: Orthopedic Surgery | Admitting: Orthopedic Surgery

## 2017-03-26 ENCOUNTER — Other Ambulatory Visit: Payer: Self-pay | Admitting: Orthopedic Surgery

## 2017-03-26 ENCOUNTER — Ambulatory Visit: Payer: Self-pay | Admitting: Orthopedic Surgery

## 2017-03-26 ENCOUNTER — Encounter (HOSPITAL_COMMUNITY): Payer: Self-pay | Admitting: *Deleted

## 2017-03-26 DIAGNOSIS — S82042A Displaced comminuted fracture of left patella, initial encounter for closed fracture: Secondary | ICD-10-CM

## 2017-03-26 NOTE — Progress Notes (Signed)
Called Lorenda Peck, diabetes coordinator, about patient's admission for surgery on 03/27/17.

## 2017-03-27 ENCOUNTER — Ambulatory Visit (HOSPITAL_COMMUNITY)
Admission: RE | Admit: 2017-03-27 | Discharge: 2017-03-27 | Disposition: A | Discharge status: Home / Self Care | Payer: Medicare Other | Source: Ambulatory Visit | Attending: Orthopedic Surgery | Admitting: Orthopedic Surgery

## 2017-03-27 ENCOUNTER — Encounter (HOSPITAL_COMMUNITY)
Admission: RE | Disposition: A | Discharge status: Home / Self Care | Payer: Self-pay | Source: Ambulatory Visit | Attending: Orthopedic Surgery

## 2017-03-27 ENCOUNTER — Encounter (HOSPITAL_COMMUNITY): Payer: Self-pay | Admitting: *Deleted

## 2017-03-27 ENCOUNTER — Ambulatory Visit (HOSPITAL_COMMUNITY): Payer: Medicare Other | Admitting: Anesthesiology

## 2017-03-27 DIAGNOSIS — Z794 Long term (current) use of insulin: Secondary | ICD-10-CM | POA: Insufficient documentation

## 2017-03-27 DIAGNOSIS — X58XXXA Exposure to other specified factors, initial encounter: Secondary | ICD-10-CM | POA: Diagnosis not present

## 2017-03-27 DIAGNOSIS — E119 Type 2 diabetes mellitus without complications: Secondary | ICD-10-CM | POA: Diagnosis not present

## 2017-03-27 DIAGNOSIS — I2510 Atherosclerotic heart disease of native coronary artery without angina pectoris: Secondary | ICD-10-CM | POA: Diagnosis not present

## 2017-03-27 DIAGNOSIS — Z955 Presence of coronary angioplasty implant and graft: Secondary | ICD-10-CM | POA: Diagnosis not present

## 2017-03-27 DIAGNOSIS — Z538 Procedure and treatment not carried out for other reasons: Secondary | ICD-10-CM | POA: Diagnosis not present

## 2017-03-27 DIAGNOSIS — S82042A Displaced comminuted fracture of left patella, initial encounter for closed fracture: Secondary | ICD-10-CM | POA: Diagnosis not present

## 2017-03-27 LAB — BASIC METABOLIC PANEL
ANION GAP: 9 (ref 5–15)
BUN: 12 mg/dL (ref 6–20)
CHLORIDE: 103 mmol/L (ref 101–111)
CO2: 27 mmol/L (ref 22–32)
Calcium: 8.8 mg/dL — ABNORMAL LOW (ref 8.9–10.3)
Creatinine, Ser: 0.73 mg/dL (ref 0.44–1.00)
GFR calc Af Amer: >60 mL/min (ref >60)
GFR calc non Af Amer: >60 mL/min (ref >60)
GLUCOSE: 154 mg/dL — AB (ref 65–99)
POTASSIUM: 4.1 mmol/L (ref 3.5–5.1)
Sodium: 139 mmol/L (ref 135–145)

## 2017-03-27 LAB — GLUCOSE, CAPILLARY
GLUCOSE-CAPILLARY: 156 mg/dL — AB (ref 65–99)
GLUCOSE-CAPILLARY: 104 mg/dL — AB (ref 65–99)

## 2017-03-27 LAB — CBC
HCT: 41.3 % (ref 36.0–46.0)
Hemoglobin: 13.9 g/dL (ref 12.0–15.0)
MCH: 30.8 pg (ref 26.0–34.0)
MCHC: 33.7 g/dL (ref 30.0–36.0)
MCV: 91.4 fL (ref 78.0–100.0)
PLATELETS: 302 10*3/uL (ref 150–400)
RBC: 4.52 MIL/uL (ref 3.87–5.11)
RDW: 12.8 % (ref 11.5–15.5)
WBC: 6.5 10*3/uL (ref 4.0–10.5)

## 2017-03-27 SURGERY — OPEN REDUCTION INTERNAL FIXATION (ORIF) PATELLA
Anesthesia: Regional | Site: Knee | Laterality: Left

## 2017-03-27 MED ORDER — DEXAMETHASONE SODIUM PHOSPHATE 10 MG/ML IJ SOLN
INTRAMUSCULAR | Status: AC
  Filled 2017-03-27: qty 1

## 2017-03-27 MED ORDER — MIDAZOLAM HCL 2 MG/2ML IJ SOLN
1.0000 mg | INTRAMUSCULAR | Status: DC | PRN
  Administered 2017-03-27: 2 mg via INTRAVENOUS
  Filled 2017-03-27: qty 2

## 2017-03-27 MED ORDER — MIDAZOLAM HCL 2 MG/2ML IJ SOLN
INTRAMUSCULAR | Status: AC
  Administered 2017-03-27: 2 mg via INTRAVENOUS
  Filled 2017-03-27: qty 2

## 2017-03-27 MED ORDER — FENTANYL CITRATE (PF) 100 MCG/2ML IJ SOLN
INTRAMUSCULAR | Status: AC
  Filled 2017-03-27: qty 2

## 2017-03-27 MED ORDER — MIDAZOLAM HCL 2 MG/2ML IJ SOLN
INTRAMUSCULAR | Status: AC
  Filled 2017-03-27: qty 2

## 2017-03-27 MED ORDER — PROPOFOL 10 MG/ML IV BOLUS
INTRAVENOUS | Status: AC
  Filled 2017-03-27: qty 20

## 2017-03-27 MED ORDER — FENTANYL CITRATE (PF) 100 MCG/2ML IJ SOLN
INTRAMUSCULAR | Status: AC
  Administered 2017-03-27: 100 ug via INTRAVENOUS
  Filled 2017-03-27: qty 2

## 2017-03-27 MED ORDER — ONDANSETRON HCL 4 MG/2ML IJ SOLN
INTRAMUSCULAR | Status: AC
  Filled 2017-03-27: qty 2

## 2017-03-27 MED ORDER — CEFAZOLIN SODIUM-DEXTROSE 2-4 GM/100ML-% IV SOLN
2.0000 g | INTRAVENOUS | Status: DC
  Filled 2017-03-27: qty 100

## 2017-03-27 MED ORDER — FENTANYL CITRATE (PF) 100 MCG/2ML IJ SOLN
50.0000 ug | INTRAMUSCULAR | Status: DC | PRN
  Administered 2017-03-27: 100 ug via INTRAVENOUS

## 2017-03-27 MED ORDER — ROPIVACAINE HCL 5 MG/ML IJ SOLN
INTRAMUSCULAR | Status: DC | PRN
  Administered 2017-03-27: 30 mL via PERINEURAL

## 2017-03-27 MED ORDER — LACTATED RINGERS IV SOLN
INTRAVENOUS | Status: DC
  Administered 2017-03-27 (×2): via INTRAVENOUS

## 2017-03-27 MED ORDER — CHLORHEXIDINE GLUCONATE 4 % EX LIQD: 60.0000 mL | Freq: Once | CUTANEOUS | Status: DC

## 2017-03-27 NOTE — Anesthesia Procedure Notes (Signed)
Anesthesia Regional Block: Femoral nerve block   Pre-Anesthetic Checklist: ,, timeout performed, Correct Patient, Correct Site, Correct Laterality, Correct Procedure, Correct Position, site marked, Risks and benefits discussed,  Surgical consent,  Pre-op evaluation,  At surgeon's request and post-op pain management  Laterality: Left  Prep: Dura Prep       Needles:  Injection technique: Single-shot  Needle Type: Stimiplex     Needle Length: 5cm  Needle Gauge: 22     Additional Needles:   Procedures: ultrasound guided,,,,,,,,   Nerve Stimulator or Paresthesia:  Response: quadraceps contraction, 0.45 mA,   Additional Responses:   Narrative:  Injection made incrementally with aspirations every 5 mL.  Performed by: Personally  Anesthesiologist: Candida Peeling RAY  Additional Notes: Functioning IV was confirmed and monitors were applied.  Ultrasound guidance: relevant anatomy identified, needle position confirmed, local anesthetic spread visualized around nerve(s)., vascular puncture avoided.  Image printed for medical record. Negative aspiration and negative test dose prior to incremental administration of local anesthetic. The patient tolerated the procedure well.

## 2017-03-27 NOTE — Transfer of Care (Signed)
Case cancelled

## 2017-03-27 NOTE — Anesthesia Postprocedure Evaluation (Signed)
Anesthesia Post Note  Patient: [name]  Procedure(s) Performed: * No procedures listed *  Patient location during evaluation: PACU Anesthesia Type: Regional Level of consciousness: awake and alert Pain management: pain level controlled Vital Signs Assessment: post-procedure vital signs reviewed and stable Respiratory status: spontaneous breathing, nonlabored ventilation and respiratory function stable Cardiovascular status: stable and blood pressure returned to baseline Anesthetic complications: no       Last Vitals: There were no vitals filed for this visit.  Last Pain: There were no vitals filed for this visit.               Lynda Rainwater

## 2017-03-27 NOTE — Anesthesia Preprocedure Evaluation (Signed)
Anesthesia Evaluation  Patient identified by MRN, date of birth, ID band Patient awake    Reviewed: Allergy & Precautions, H&P , NPO status , Patient's Chart, lab work & pertinent test results, reviewed documented beta blocker date and time   Airway Mallampati: II  TM Distance: >3 FB Neck ROM: full    Dental   Pulmonary neg pulmonary ROS,    breath sounds clear to auscultation       Cardiovascular + CAD and + Cardiac Stents   Rhythm:regular     Neuro/Psych negative neurological ROS  negative psych ROS   GI/Hepatic negative GI ROS, Neg liver ROS,   Endo/Other  diabetes, Insulin Dependent  Renal/GU negative Renal ROS  negative genitourinary   Musculoskeletal  (+) Arthritis ,   Abdominal   Peds  Hematology negative hematology ROS (+)   Anesthesia Other Findings See surgeon's H&P   Reproductive/Obstetrics negative OB ROS                             Anesthesia Physical  Anesthesia Plan  ASA: III  Anesthesia Plan: General and Regional   Post-op Pain Management: GA combined w/ Regional for post-op pain   Induction: Intravenous  Airway Management Planned: LMA  Additional Equipment:   Intra-op Plan:   Post-operative Plan: Extubation in OR  Informed Consent: I have reviewed the patients History and Physical, chart, labs and discussed the procedure including the risks, benefits and alternatives for the proposed anesthesia with the patient or authorized representative who has indicated his/her understanding and acceptance.   Dental Advisory Given  Plan Discussed with: CRNA and Surgeon  Anesthesia Plan Comments:         Anesthesia Quick Evaluation

## 2017-03-27 NOTE — Consult Note (Signed)
Patient has significant abrasions over surgical site. Recommend waiting until soft tissue envelope improves prior to proceeding with surgery in order to decrease the risk of infection. Pt also diabetic. Will postpone surgery for now. F/u in the office next week to check skin.

## 2017-04-01 ENCOUNTER — Encounter: Payer: Self-pay | Admitting: Internal Medicine

## 2017-04-02 DIAGNOSIS — S82042D Displaced comminuted fracture of left patella, subsequent encounter for closed fracture with routine healing: Secondary | ICD-10-CM | POA: Diagnosis not present

## 2017-04-07 DIAGNOSIS — S82042D Displaced comminuted fracture of left patella, subsequent encounter for closed fracture with routine healing: Secondary | ICD-10-CM | POA: Diagnosis not present

## 2017-04-08 ENCOUNTER — Ambulatory Visit: Payer: Self-pay | Admitting: Orthopedic Surgery

## 2017-04-09 ENCOUNTER — Encounter (HOSPITAL_COMMUNITY): Payer: Self-pay | Admitting: *Deleted

## 2017-04-09 NOTE — Progress Notes (Signed)
Cardiologist Dr. Acie Fredrickson, last seen Nov 2017 for regular appt.  Pt denies any recent chest pain, cough, SOB, fever.  Pt reports being off of Plavix for last 8 days.  Pt instructed to stop vitamins, herbals, and NSAIDS.  Instructed pt to take metoprolol with a sip of water morning of surgery, nothing else to eat or drink after midnight.  Instructed to decrease insulin pump basal rate by 20% and to bring all pump supplies to hospital.  Pt verbalized understanding to check CBG the morning of surgery and if it is less than 70, to turn pump off and take either 1/2 cup clear juice (apple or cranberry), glucose gel, or glucose tab and then to recheck CBG 15 min later.  If CBG still low, pt instructed to call [phone] and ask to speak to a nurse.  Diabetes Coordinator called and made aware of pt's surgery date/time/surgery length.

## 2017-04-10 NOTE — Anesthesia Preprocedure Evaluation (Addendum)
Anesthesia Evaluation  Patient identified by MRN, date of birth, ID band Patient awake    Reviewed: Allergy & Precautions, H&P , NPO status , Patient's Chart, lab work & pertinent test results, reviewed documented beta blocker date and time   History of Anesthesia Complications Negative for: history of anesthetic complications  Airway Mallampati: II  TM Distance: >3 FB Neck ROM: full    Dental no notable dental hx. (+) Dental Advisory Given   Pulmonary neg pulmonary ROS,    Pulmonary exam normal        Cardiovascular + CAD and + Cardiac Stents  Normal cardiovascular exam     Neuro/Psych negative neurological ROS  negative psych ROS   GI/Hepatic negative GI ROS, Neg liver ROS,   Endo/Other  diabetes, Insulin Dependent  Renal/GU negative Renal ROS  negative genitourinary   Musculoskeletal  (+) Arthritis ,   Abdominal   Peds  Hematology negative hematology ROS (+)   Anesthesia Other Findings See surgeon's H&P   Reproductive/Obstetrics negative OB ROS                            Anesthesia Physical  Anesthesia Plan  ASA: III  Anesthesia Plan: General and Regional   Post-op Pain Management: GA combined w/ Regional for post-op pain   Induction: Intravenous  PONV Risk Score and Plan: 3 and Ondansetron, Dexamethasone, Treatment may vary due to age and Scopolamine patch - Pre-op  Airway Management Planned: LMA  Additional Equipment:   Intra-op Plan:   Post-operative Plan: Extubation in OR  Informed Consent: I have reviewed the patients History and Physical, chart, labs and discussed the procedure including the risks, benefits and alternatives for the proposed anesthesia with the patient or authorized representative who has indicated his/her understanding and acceptance.   Dental Advisory Given and Dental advisory given  Plan Discussed with: CRNA, Anesthesiologist and  Surgeon  Anesthesia Plan Comments:       Anesthesia Quick Evaluation

## 2017-04-11 ENCOUNTER — Inpatient Hospital Stay (HOSPITAL_COMMUNITY)
Admission: RE | Admit: 2017-04-11 | Discharge: 2017-04-12 | DRG: 517 | Disposition: A | Discharge status: Home Health | Payer: Medicare Other | Source: Ambulatory Visit | Attending: Orthopedic Surgery | Admitting: Orthopedic Surgery

## 2017-04-11 ENCOUNTER — Inpatient Hospital Stay (HOSPITAL_COMMUNITY): Payer: Medicare Other

## 2017-04-11 ENCOUNTER — Ambulatory Visit (HOSPITAL_COMMUNITY): Payer: Medicare Other | Admitting: Anesthesiology

## 2017-04-11 ENCOUNTER — Ambulatory Visit (HOSPITAL_COMMUNITY): Payer: Medicare Other

## 2017-04-11 ENCOUNTER — Encounter (HOSPITAL_COMMUNITY)
Admission: RE | Disposition: A | Discharge status: Home Health | Payer: Self-pay | Source: Ambulatory Visit | Attending: Orthopedic Surgery

## 2017-04-11 ENCOUNTER — Encounter (HOSPITAL_COMMUNITY): Payer: Self-pay | Admitting: Certified Registered Nurse Anesthetist

## 2017-04-11 DIAGNOSIS — I2510 Atherosclerotic heart disease of native coronary artery without angina pectoris: Secondary | ICD-10-CM | POA: Diagnosis present

## 2017-04-11 DIAGNOSIS — W010XXA Fall on same level from slipping, tripping and stumbling without subsequent striking against object, initial encounter: Secondary | ICD-10-CM | POA: Diagnosis present

## 2017-04-11 DIAGNOSIS — M1990 Unspecified osteoarthritis, unspecified site: Secondary | ICD-10-CM | POA: Diagnosis present

## 2017-04-11 DIAGNOSIS — I4892 Unspecified atrial flutter: Secondary | ICD-10-CM | POA: Diagnosis not present

## 2017-04-11 DIAGNOSIS — Z09 Encounter for follow-up examination after completed treatment for conditions other than malignant neoplasm: Secondary | ICD-10-CM

## 2017-04-11 DIAGNOSIS — Z7902 Long term (current) use of antithrombotics/antiplatelets: Secondary | ICD-10-CM | POA: Diagnosis not present

## 2017-04-11 DIAGNOSIS — S82042A Displaced comminuted fracture of left patella, initial encounter for closed fracture: Secondary | ICD-10-CM | POA: Diagnosis present

## 2017-04-11 DIAGNOSIS — E109 Type 1 diabetes mellitus without complications: Secondary | ICD-10-CM | POA: Diagnosis present

## 2017-04-11 DIAGNOSIS — Z955 Presence of coronary angioplasty implant and graft: Secondary | ICD-10-CM

## 2017-04-11 DIAGNOSIS — G8918 Other acute postprocedural pain: Secondary | ICD-10-CM | POA: Diagnosis not present

## 2017-04-11 DIAGNOSIS — S82092A Other fracture of left patella, initial encounter for closed fracture: Secondary | ICD-10-CM | POA: Diagnosis not present

## 2017-04-11 DIAGNOSIS — E785 Hyperlipidemia, unspecified: Secondary | ICD-10-CM | POA: Diagnosis present

## 2017-04-11 DIAGNOSIS — Z882 Allergy status to sulfonamides status: Secondary | ICD-10-CM

## 2017-04-11 DIAGNOSIS — Z7982 Long term (current) use of aspirin: Secondary | ICD-10-CM | POA: Diagnosis not present

## 2017-04-11 DIAGNOSIS — Z419 Encounter for procedure for purposes other than remedying health state, unspecified: Secondary | ICD-10-CM

## 2017-04-11 DIAGNOSIS — Z9641 Presence of insulin pump (external) (internal): Secondary | ICD-10-CM | POA: Diagnosis present

## 2017-04-11 DIAGNOSIS — Z8249 Family history of ischemic heart disease and other diseases of the circulatory system: Secondary | ICD-10-CM

## 2017-04-11 DIAGNOSIS — Z794 Long term (current) use of insulin: Secondary | ICD-10-CM | POA: Diagnosis not present

## 2017-04-11 DIAGNOSIS — S82002A Unspecified fracture of left patella, initial encounter for closed fracture: Secondary | ICD-10-CM | POA: Diagnosis present

## 2017-04-11 DIAGNOSIS — Z833 Family history of diabetes mellitus: Secondary | ICD-10-CM

## 2017-04-11 HISTORY — PX: ORIF PATELLA: SHX5033

## 2017-04-11 HISTORY — DX: Presence of insulin pump (external) (internal): Z96.41

## 2017-04-11 HISTORY — PX: APPLICATION OF WOUND VAC: SHX5189

## 2017-04-11 HISTORY — DX: Cardiac arrhythmia, unspecified: I49.9

## 2017-04-11 LAB — COMPREHENSIVE METABOLIC PANEL
ALBUMIN: 3.1 g/dL — AB (ref 3.5–5.0)
ALK PHOS: 87 U/L (ref 38–126)
ALT: 13 U/L — AB (ref 14–54)
ANION GAP: 6 (ref 5–15)
AST: 13 U/L — AB (ref 15–41)
BILIRUBIN TOTAL: 0.6 mg/dL (ref 0.3–1.2)
BUN: 8 mg/dL (ref 6–20)
CALCIUM: 8.6 mg/dL — AB (ref 8.9–10.3)
CO2: 29 mmol/L (ref 22–32)
CREATININE: 0.75 mg/dL (ref 0.44–1.00)
Chloride: 103 mmol/L (ref 101–111)
GFR calc Af Amer: >60 mL/min (ref >60)
GFR calc non Af Amer: >60 mL/min (ref >60)
Glucose, Bld: 180 mg/dL — ABNORMAL HIGH (ref 65–99)
Potassium: 3.9 mmol/L (ref 3.5–5.1)
Sodium: 138 mmol/L (ref 135–145)
Total Protein: 5.8 g/dL — ABNORMAL LOW (ref 6.5–8.1)

## 2017-04-11 LAB — CBC
HEMATOCRIT: 39.5 % (ref 36.0–46.0)
HEMOGLOBIN: 12.8 g/dL (ref 12.0–15.0)
MCH: 29.7 pg (ref 26.0–34.0)
MCHC: 32.4 g/dL (ref 30.0–36.0)
MCV: 91.6 fL (ref 78.0–100.0)
Platelets: 321 10*3/uL (ref 150–400)
RBC: 4.31 MIL/uL (ref 3.87–5.11)
RDW: 12.5 % (ref 11.5–15.5)
WBC: 5.5 10*3/uL (ref 4.0–10.5)

## 2017-04-11 LAB — GLUCOSE, CAPILLARY
Glucose-Capillary: 182 mg/dL — ABNORMAL HIGH (ref 65–99)
Glucose-Capillary: 131 mg/dL — ABNORMAL HIGH (ref 65–99)
Glucose-Capillary: 96 mg/dL (ref 65–99)
Glucose-Capillary: 141 mg/dL — ABNORMAL HIGH (ref 65–99)
Glucose-Capillary: 149 mg/dL — ABNORMAL HIGH (ref 65–99)

## 2017-04-11 SURGERY — OPEN REDUCTION INTERNAL FIXATION (ORIF) PATELLA
Anesthesia: Regional | Site: Knee | Laterality: Left

## 2017-04-11 MED ORDER — CHLORHEXIDINE GLUCONATE 4 % EX LIQD
60.0000 mL | Freq: Once | CUTANEOUS | Status: DC
Start: 2017-04-11 — End: 2017-04-11

## 2017-04-11 MED ORDER — ONDANSETRON HCL 4 MG/2ML IJ SOLN: 4.0000 mg | Freq: Four times a day (QID) | INTRAMUSCULAR | Status: DC | PRN

## 2017-04-11 MED ORDER — METOCLOPRAMIDE HCL 5 MG/ML IJ SOLN: 5.0000 mg | Freq: Three times a day (TID) | INTRAMUSCULAR | Status: DC | PRN

## 2017-04-11 MED ORDER — ISOPROPYL ALCOHOL 70 % SOLN
Status: DC | PRN
  Administered 2017-04-11: 1 via TOPICAL

## 2017-04-11 MED ORDER — BUPIVACAINE-EPINEPHRINE (PF) 0.5% -1:200000 IJ SOLN
INTRAMUSCULAR | Status: DC | PRN
  Administered 2017-04-11: 30 mL via PERINEURAL

## 2017-04-11 MED ORDER — ASPIRIN 81 MG PO CHEW
81.0000 mg | CHEWABLE_TABLET | Freq: Every day | ORAL | Status: DC
  Filled 2017-04-11: qty 1

## 2017-04-11 MED ORDER — POLYETHYLENE GLYCOL 3350 17 G PO PACK: 17.0000 g | PACK | Freq: Every day | ORAL | Status: DC | PRN

## 2017-04-11 MED ORDER — MIDAZOLAM HCL 2 MG/2ML IJ SOLN
INTRAMUSCULAR | Status: AC
  Filled 2017-04-11: qty 2

## 2017-04-11 MED ORDER — FENTANYL CITRATE (PF) 100 MCG/2ML IJ SOLN
INTRAMUSCULAR | Status: DC | PRN
  Administered 2017-04-11 (×2): 50 ug via INTRAVENOUS
  Administered 2017-04-11: 25 ug via INTRAVENOUS

## 2017-04-11 MED ORDER — CLOPIDOGREL BISULFATE 75 MG PO TABS
75.0000 mg | ORAL_TABLET | Freq: Every day | ORAL | Status: DC
  Administered 2017-04-12: 75 mg via ORAL
  Filled 2017-04-11: qty 1

## 2017-04-11 MED ORDER — ROSUVASTATIN CALCIUM 10 MG PO TABS
40.0000 mg | ORAL_TABLET | Freq: Every day | ORAL | Status: DC
  Administered 2017-04-11 – 2017-04-12 (×2): 40 mg via ORAL
  Filled 2017-04-11: qty 8
  Filled 2017-04-11 (×2): qty 4

## 2017-04-11 MED ORDER — EPHEDRINE 5 MG/ML INJ
INTRAVENOUS | Status: AC
  Filled 2017-04-11: qty 20

## 2017-04-11 MED ORDER — CEFAZOLIN SODIUM-DEXTROSE 2-4 GM/100ML-% IV SOLN
2.0000 g | INTRAVENOUS | Status: AC
  Administered 2017-04-11: 2 g via INTRAVENOUS
  Filled 2017-04-11: qty 100

## 2017-04-11 MED ORDER — METOCLOPRAMIDE HCL 5 MG PO TABS: 5.0000 mg | ORAL_TABLET | Freq: Three times a day (TID) | ORAL | Status: DC | PRN

## 2017-04-11 MED ORDER — CEFAZOLIN SODIUM-DEXTROSE 2-4 GM/100ML-% IV SOLN
2.0000 g | Freq: Four times a day (QID) | INTRAVENOUS | Status: AC
  Administered 2017-04-11 – 2017-04-12 (×3): 2 g via INTRAVENOUS
  Filled 2017-04-11 (×3): qty 100

## 2017-04-11 MED ORDER — ACETAMINOPHEN 325 MG PO TABS: 650.0000 mg | ORAL_TABLET | Freq: Four times a day (QID) | ORAL | Status: DC | PRN

## 2017-04-11 MED ORDER — INSULIN PUMP
Freq: Three times a day (TID) | SUBCUTANEOUS | Status: DC
  Administered 2017-04-11: 1 via SUBCUTANEOUS
  Administered 2017-04-11 – 2017-04-12 (×3): via SUBCUTANEOUS
  Filled 2017-04-11: qty 1

## 2017-04-11 MED ORDER — HYDROMORPHONE HCL 1 MG/ML IJ SOLN
INTRAMUSCULAR | Status: AC
  Filled 2017-04-11: qty 1

## 2017-04-11 MED ORDER — SCOPOLAMINE 1 MG/3DAYS TD PT72
1.0000 | MEDICATED_PATCH | TRANSDERMAL | Status: DC
  Administered 2017-04-11: 1 via TRANSDERMAL
  Filled 2017-04-11: qty 1

## 2017-04-11 MED ORDER — ONDANSETRON HCL 4 MG PO TABS: 4.0000 mg | ORAL_TABLET | Freq: Four times a day (QID) | ORAL | Status: DC | PRN

## 2017-04-11 MED ORDER — LACTATED RINGERS IV SOLN
INTRAVENOUS | Status: DC | PRN
  Administered 2017-04-11 (×2): via INTRAVENOUS

## 2017-04-11 MED ORDER — ONDANSETRON HCL 4 MG/2ML IJ SOLN
INTRAMUSCULAR | Status: DC | PRN
  Administered 2017-04-11: 4 mg via INTRAVENOUS

## 2017-04-11 MED ORDER — FENTANYL CITRATE (PF) 250 MCG/5ML IJ SOLN
INTRAMUSCULAR | Status: AC
  Filled 2017-04-11: qty 5

## 2017-04-11 MED ORDER — PROPOFOL 10 MG/ML IV BOLUS
INTRAVENOUS | Status: DC | PRN
  Administered 2017-04-11: 150 mg via INTRAVENOUS

## 2017-04-11 MED ORDER — GLYCOPYRROLATE 0.2 MG/ML IJ SOLN
INTRAMUSCULAR | Status: DC | PRN
  Administered 2017-04-11: 0.2 mg via INTRAVENOUS

## 2017-04-11 MED ORDER — KETOROLAC TROMETHAMINE 15 MG/ML IJ SOLN
7.5000 mg | Freq: Four times a day (QID) | INTRAMUSCULAR | Status: AC
  Administered 2017-04-11 – 2017-04-12 (×4): 7.5 mg via INTRAVENOUS
  Filled 2017-04-11 (×4): qty 1

## 2017-04-11 MED ORDER — HYDROCODONE-ACETAMINOPHEN 5-325 MG PO TABS
1.0000 | ORAL_TABLET | ORAL | Status: DC | PRN
  Administered 2017-04-11 – 2017-04-12 (×2): 2 via ORAL
  Filled 2017-04-11 (×2): qty 2

## 2017-04-11 MED ORDER — 0.9 % SODIUM CHLORIDE (POUR BTL) OPTIME
TOPICAL | Status: DC | PRN
  Administered 2017-04-11: 1000 mL

## 2017-04-11 MED ORDER — METHOCARBAMOL 1000 MG/10ML IJ SOLN: 500.0000 mg | Freq: Four times a day (QID) | INTRAVENOUS | Status: DC | PRN

## 2017-04-11 MED ORDER — BUPIVACAINE-EPINEPHRINE (PF) 0.25% -1:200000 IJ SOLN
INTRAMUSCULAR | Status: AC
  Filled 2017-04-11: qty 30

## 2017-04-11 MED ORDER — EPHEDRINE SULFATE 50 MG/ML IJ SOLN
INTRAMUSCULAR | Status: DC | PRN
  Administered 2017-04-11 (×2): 5 mg via INTRAVENOUS

## 2017-04-11 MED ORDER — HYDROMORPHONE HCL 1 MG/ML IJ SOLN
0.2500 mg | INTRAMUSCULAR | Status: DC | PRN
  Administered 2017-04-11: 0.5 mg via INTRAVENOUS

## 2017-04-11 MED ORDER — NITROGLYCERIN 0.4 MG SL SUBL: 0.4000 mg | SUBLINGUAL_TABLET | SUBLINGUAL | Status: DC | PRN

## 2017-04-11 MED ORDER — METHOCARBAMOL 500 MG PO TABS: 500.0000 mg | ORAL_TABLET | Freq: Four times a day (QID) | ORAL | Status: DC | PRN

## 2017-04-11 MED ORDER — HYDROMORPHONE HCL 1 MG/ML IJ SOLN: 0.5000 mg | INTRAMUSCULAR | Status: DC | PRN

## 2017-04-11 MED ORDER — SODIUM CHLORIDE 0.9 % IV SOLN
INTRAVENOUS | Status: DC
  Administered 2017-04-11: 12:00:00 via INTRAVENOUS

## 2017-04-11 MED ORDER — ACETAMINOPHEN 650 MG RE SUPP
650.0000 mg | Freq: Four times a day (QID) | RECTAL | Status: DC | PRN
Start: 2017-04-11 — End: 2017-04-12

## 2017-04-11 MED ORDER — ONDANSETRON HCL 4 MG/2ML IJ SOLN
INTRAMUSCULAR | Status: AC
  Filled 2017-04-11: qty 2

## 2017-04-11 MED ORDER — PHENYLEPHRINE 40 MCG/ML (10ML) SYRINGE FOR IV PUSH (FOR BLOOD PRESSURE SUPPORT)
PREFILLED_SYRINGE | INTRAVENOUS | Status: AC
  Filled 2017-04-11: qty 10

## 2017-04-11 MED ORDER — DIPHENHYDRAMINE HCL 12.5 MG/5ML PO ELIX: 12.5000 mg | ORAL_SOLUTION | ORAL | Status: DC | PRN

## 2017-04-11 MED ORDER — METOPROLOL SUCCINATE ER 100 MG PO TB24
100.0000 mg | ORAL_TABLET | Freq: Every day | ORAL | Status: DC
  Administered 2017-04-12: 100 mg via ORAL
  Filled 2017-04-11: qty 1

## 2017-04-11 MED ORDER — PIOGLITAZONE HCL 15 MG PO TABS
15.0000 mg | ORAL_TABLET | Freq: Every day | ORAL | Status: DC
  Administered 2017-04-11 – 2017-04-12 (×2): 15 mg via ORAL
  Filled 2017-04-11 (×2): qty 1

## 2017-04-11 MED ORDER — MIDAZOLAM HCL 2 MG/2ML IJ SOLN
INTRAMUSCULAR | Status: DC | PRN
  Administered 2017-04-11: 2 mg via INTRAVENOUS

## 2017-04-11 MED ORDER — PROPOFOL 10 MG/ML IV BOLUS
INTRAVENOUS | Status: AC
  Filled 2017-04-11: qty 20

## 2017-04-11 MED ORDER — EPHEDRINE 5 MG/ML INJ
INTRAVENOUS | Status: AC
  Filled 2017-04-11: qty 10

## 2017-04-11 MED ORDER — METFORMIN HCL ER 500 MG PO TB24
500.0000 mg | ORAL_TABLET | Freq: Two times a day (BID) | ORAL | Status: DC
  Administered 2017-04-11 – 2017-04-12 (×2): 500 mg via ORAL
  Filled 2017-04-11 (×2): qty 1

## 2017-04-11 MED ORDER — PROMETHAZINE HCL 25 MG/ML IJ SOLN: 6.2500 mg | INTRAMUSCULAR | Status: DC | PRN

## 2017-04-11 SURGICAL SUPPLY — 78 items
BANDAGE ACE 4X5 VEL STRL LF (GAUZE/BANDAGES/DRESSINGS) ×1 IMPLANT
BANDAGE ACE 6X5 VEL STRL LF (GAUZE/BANDAGES/DRESSINGS) ×1 IMPLANT
BANDAGE ESMARK 6X9 LF (GAUZE/BANDAGES/DRESSINGS) ×1 IMPLANT
BIT DRILL 2.9 CANN QC NONSTRL (BIT) ×2 IMPLANT
BNDG CMPR 9X6 STRL LF SNTH (GAUZE/BANDAGES/DRESSINGS) ×1
BNDG CMPR MED 10X6 ELC LF (GAUZE/BANDAGES/DRESSINGS) ×1
BNDG COHESIVE 4X5 TAN STRL (GAUZE/BANDAGES/DRESSINGS) ×3 IMPLANT
BNDG ELASTIC 6X10 VLCR STRL LF (GAUZE/BANDAGES/DRESSINGS) ×2 IMPLANT
BNDG ESMARK 6X9 LF (GAUZE/BANDAGES/DRESSINGS) ×3
CANISTER WOUND CARE 500ML ATS (WOUND CARE) ×2 IMPLANT
CHLORAPREP W/TINT 26ML (MISCELLANEOUS) ×3 IMPLANT
CLOSURE WOUND 1/2 X4 (GAUZE/BANDAGES/DRESSINGS)
COVER SURGICAL LIGHT HANDLE (MISCELLANEOUS) ×3 IMPLANT
CUFF TOURNIQUET SINGLE 34IN LL (TOURNIQUET CUFF) ×3 IMPLANT
DECANTER SPIKE VIAL GLASS SM (MISCELLANEOUS) ×1 IMPLANT
DRAPE C-ARM 42X72 X-RAY (DRAPES) ×3 IMPLANT
DRAPE C-ARMOR (DRAPES) ×3 IMPLANT
DRAPE HALF SHEET 40X57 (DRAPES) ×1 IMPLANT
DRAPE INCISE IOBAN 66X45 STRL (DRAPES) ×4 IMPLANT
DRAPE U-SHAPE 47X51 STRL (DRAPES) ×4 IMPLANT
DRESSING PREVENA PLUS CUSTOM (GAUZE/BANDAGES/DRESSINGS) IMPLANT
DRSG ADAPTIC 3X8 NADH LF (GAUZE/BANDAGES/DRESSINGS) ×1 IMPLANT
DRSG PAD ABDOMINAL 8X10 ST (GAUZE/BANDAGES/DRESSINGS) ×1 IMPLANT
DRSG PREVENA PLUS CUSTOM (GAUZE/BANDAGES/DRESSINGS) ×3
ELECT CAUTERY BLADE 6.4 (BLADE) ×2 IMPLANT
ELECT REM PT RETURN 9FT ADLT (ELECTROSURGICAL) ×3
ELECTRODE REM PT RTRN 9FT ADLT (ELECTROSURGICAL) ×1 IMPLANT
FACESHIELD WRAPAROUND (MASK) ×3 IMPLANT
FACESHIELD WRAPAROUND OR TEAM (MASK) ×1 IMPLANT
GAUZE SPONGE 4X4 12PLY STRL (GAUZE/BANDAGES/DRESSINGS) ×2 IMPLANT
GLOVE BIO SURGEON STRL SZ8.5 (GLOVE) ×5 IMPLANT
GLOVE BIOGEL PI IND STRL 6.5 (GLOVE) IMPLANT
GLOVE BIOGEL PI IND STRL 8 (GLOVE) IMPLANT
GLOVE BIOGEL PI IND STRL 8.5 (GLOVE) ×1 IMPLANT
GLOVE BIOGEL PI INDICATOR 6.5 (GLOVE) ×4
GLOVE BIOGEL PI INDICATOR 8 (GLOVE) ×2
GLOVE BIOGEL PI INDICATOR 8.5 (GLOVE) ×2
GLOVE SURG SS PI 6.0 STRL IVOR (GLOVE) ×2 IMPLANT
GOWN SPEC L3 XXLG W/TWL (GOWN DISPOSABLE) ×4 IMPLANT
GOWN STRL REUS W/TWL LRG LVL3 (GOWN DISPOSABLE) ×5 IMPLANT
IMMOBILIZER KNEE 22 (SOFTGOODS) ×1 IMPLANT
IMMOBILIZER KNEE 22 UNIV (SOFTGOODS) ×2 IMPLANT
MANIFOLD NEPTUNE II (INSTRUMENTS) ×3 IMPLANT
NDL 1/2 CIR MAYO (NEEDLE) ×1 IMPLANT
NEEDLE 1/2 CIR MAYO (NEEDLE) ×3 IMPLANT
NS IRRIG 1000ML POUR BTL (IV SOLUTION) ×3 IMPLANT
PACK ORTHO EXTREMITY (CUSTOM PROCEDURE TRAY) ×3 IMPLANT
PAD ARMBOARD 7.5X6 YLW CONV (MISCELLANEOUS) ×3 IMPLANT
PAD CAST 4YDX4 CTTN HI CHSV (CAST SUPPLIES) ×2 IMPLANT
PADDING CAST ABS 6INX4YD NS (CAST SUPPLIES)
PADDING CAST ABS COTTON 6X4 NS (CAST SUPPLIES) ×2 IMPLANT
PADDING CAST COTTON 4X4 STRL (CAST SUPPLIES)
PREVENA INCISION MGT 90 150 (MISCELLANEOUS) ×2 IMPLANT
RETRIEVER SUT HEWSON (MISCELLANEOUS) ×3 IMPLANT
SCREW ACE CAN 4.0 40M (Screw) ×2 IMPLANT
SCREW ACE CAN 4.0 44M (Screw) ×2 IMPLANT
SOL PREP POV-IOD 4OZ 10% (MISCELLANEOUS) ×1 IMPLANT
SPONGE LAP 4X18 X RAY DECT (DISPOSABLE) ×1 IMPLANT
STAPLER VISISTAT 35W (STAPLE) ×4 IMPLANT
STOCKINETTE IMPERVIOUS 9X36 MD (GAUZE/BANDAGES/DRESSINGS) ×3 IMPLANT
STRIP CLOSURE SKIN 1/2X4 (GAUZE/BANDAGES/DRESSINGS) ×1 IMPLANT
SUT FIBERWIRE #2 38 T-5 BLUE (SUTURE)
SUT FIBERWIRE #5 38 CONV NDL (SUTURE) ×3
SUT MNCRL AB 4-0 PS2 18 (SUTURE) ×1 IMPLANT
SUT MON AB 2-0 CT1 27 (SUTURE) ×1 IMPLANT
SUT VIC AB 1 CT1 27 (SUTURE)
SUT VIC AB 1 CT1 27XBRD ANTBC (SUTURE) ×2 IMPLANT
SUT VIC AB 2-0 CT1 27 (SUTURE) ×6
SUT VIC AB 2-0 CT1 TAPERPNT 27 (SUTURE) IMPLANT
SUT VIC AB 3-0 PS2 18 (SUTURE)
SUT VIC AB 3-0 PS2 18XBRD (SUTURE) IMPLANT
SUTURE FIBERWR #2 38 T-5 BLUE (SUTURE) ×1 IMPLANT
SUTURE FIBERWR #5 38 CONV NDL (SUTURE) IMPLANT
TOWEL OR 17X26 10 PK STRL BLUE (TOWEL DISPOSABLE) ×4 IMPLANT
TUBE CONNECTING 12'X1/4 (SUCTIONS) ×1
TUBE CONNECTING 12X1/4 (SUCTIONS) ×2 IMPLANT
WIRE K 1.6MM 144256 (MISCELLANEOUS) ×4 IMPLANT
YANKAUER SUCT BULB TIP NO VENT (SUCTIONS) ×3 IMPLANT

## 2017-04-11 NOTE — Evaluation (Signed)
Physical Therapy Evaluation Patient Details Name: [name] MRN: [id number] DOB: 1946-07-08 Today's Date: 04/11/2017   History of Present Illness  Pt admit for left patella fracture with ORIF.  PMH: aflutter, CAD with stent, ablation, cardioversion, DM with insulin pump.   Clinical Impression  Pt admitted with above diagnosis. Pt currently with functional limitations due to the deficits listed below (see PT Problem List). Pt was able to ambulate with RW to door with min guard assist.  Has support at home and was Modif I with crutches PTA.  Should progress well. Will follow acutely.  Pt will benefit from skilled PT to increase their independence and safety with mobility to allow discharge to the venue listed below.      Follow Up Recommendations Home health PT;Supervision - Intermittent [facility])    Equipment Recommendations  None recommended by PT    Recommendations for Other Services       Precautions / Restrictions Precautions Precautions: Fall;Knee Precaution Booklet Issued: Yes (comment) Precaution Comments: VAC in place Required Braces or Orthoses: Knee Immobilizer - Left Knee Immobilizer - Left: On at all times Restrictions Weight Bearing Restrictions: Yes LLE Weight Bearing: Weight bearing as tolerated      Mobility  Bed Mobility Overal bed mobility: Needs Assistance Bed Mobility: Supine to Sit     Supine to sit: Min guard     General bed mobility comments: pt was able to move LEs off bed by herself and sit up without help  Transfers Overall transfer level: Needs assistance Equipment used: Rolling walker (2 wheeled) Transfers: Sit to/from Stand Sit to Stand: Min guard         General transfer comment: No assist for power up needed.  Cues for hand placement  Pt got to 3N1 and urinated and then walked as below.    Ambulation/Gait Ambulation/Gait assistance: Min guard Ambulation Distance (Feet): 20 Feet Assistive device: Rolling walker (2 wheeled) Gait  Pattern/deviations: Step-to pattern;Decreased step length - left;Decreased stance time - left;Decreased weight shift to left;Antalgic   Gait velocity interpretation: Below normal speed for age/gender General Gait Details: Pt was able to ambulate with RW to door with min guard assist.  Pt has crutches that she will bring tomorrow because they are different than our crutches.  Overall she did well.  Some numbness still in left foot per pt due to nerve block.  Brought chair to pt.    Stairs            Wheelchair Mobility    Modified Rankin (Stroke Patients Only)       Balance Overall balance assessment: Needs assistance Sitting-balance support: No upper extremity supported;Feet supported Sitting balance-Leahy Scale: Good     Standing balance support: Bilateral upper extremity supported;During functional activity Standing balance-Leahy Scale: Poor Standing balance comment: relied on RW for support today and min guard assist.                              Pertinent Vitals/Pain Pain Assessment: No/denies pain    Home Living Family/patient expects to be discharged to:: Private residence Living Arrangements: Spouse/significant other Available Help at Discharge: Family;Available PRN/intermittently (husband works days but comes home at lunch) Type of Home: House Home Access: Stairs to enter Entrance Stairs-Rails: Right;Left;Can reach both Technical brewer of Steps: 6 Home Layout: One level Home Equipment: Crutches;Toilet riser;Shower seat - built in Additional Comments: states she goes up and down steps with rail sideways  Prior Function Level of Independence: Independent with assistive device(s)         Comments: had been using crutches     Hand Dominance        Extremity/Trunk Assessment   Upper Extremity Assessment Upper Extremity Assessment: Defer to OT evaluation    Lower Extremity Assessment Lower Extremity Assessment: LLE  deficits/detail LLE: Unable to fully assess due to immobilization    Cervical / Trunk Assessment Cervical / Trunk Assessment: Normal  Communication   Communication: No difficulties  Cognition Arousal/Alertness: Awake/alert Behavior During Therapy: WFL for tasks assessed/performed Overall Cognitive Status: Within Functional Limits for tasks assessed                                        General Comments General comments (skin integrity, edema, etc.): VAc in place    Exercises General Exercises - Lower Extremity Ankle Circles/Pumps: AROM;Both;10 reps;Supine   Assessment/Plan    PT Assessment Patient needs continued PT services  PT Problem List Decreased strength;Decreased range of motion;Decreased activity tolerance;Decreased balance;Decreased mobility;Decreased knowledge of use of DME;Decreased safety awareness;Decreased knowledge of precautions       PT Treatment Interventions DME instruction;Gait training;Stair training;Functional mobility training;Therapeutic activities;Therapeutic exercise;Balance training;Patient/family education    PT Goals (Current goals can be found in the Care Plan section)  Acute Rehab PT Goals Patient Stated Goal: to go home PT Goal Formulation: With patient Time For Goal Achievement: 04/18/17 Potential to Achieve Goals: Good    Frequency Min 6X/week   Barriers to discharge        Co-evaluation               AM-PAC PT "6 Clicks" Daily Activity  Outcome Measure Difficulty turning over in bed (including adjusting bedclothes, sheets and blankets)?: None Difficulty moving from lying on back to sitting on the side of the bed? : None Difficulty sitting down on and standing up from a chair with arms (e.g., wheelchair, bedside commode, etc,.)?: None Help needed moving to and from a bed to chair (including a wheelchair)?: A Little Help needed walking in hospital room?: A Little Help needed climbing 3-5 steps with a railing?  : A Little 6 Click Score: 21    End of Session Equipment Utilized During Treatment: Gait belt;Left knee immobilizer Activity Tolerance: Patient tolerated treatment well Patient left: in chair;with call bell/phone within reach;with family/visitor present Nurse Communication: Mobility status PT Visit Diagnosis: Muscle weakness (generalized) (M62.81)    Time: 8088-1103 PT Time Calculation (min) (ACUTE ONLY): 24 min   Charges:   PT Evaluation $PT Eval Moderate Complexity: 1 Procedure PT Treatments $Gait Training: 8-22 mins   PT G Codes:        Kathlean Cinco,PT Acute Rehabilitation [phone] (pager)   Denice Paradise 04/11/2017, 12:11 PM

## 2017-04-11 NOTE — H&P (Signed)
PREOPERATIVE H&P  Chief Complaint: Left knee patella fracture  HPI: [name] is a 71 y.o. female who presents for preoperative history and physical with a diagnosis of Left knee patella fracture. We have been delaying surgery in order to allow her soft tissue envelope to heal.  She has elected for surgical management.   Past Medical History:  Diagnosis Date  . Arthritis   . Atrial flutter ([place])    a. s/p TEE/DCCV 08/16/13 (normal LV function, no LAA thrombus).b. s/p ablation by Dr Lovena Le 09-23-2013  . Coronary artery disease    2009 LAD Promus stent  . Dysrhythmia    Aflutter, no issues after ablation 2014  . Fibroid   . Hyperlipidemia   . Osteopenia   . Type 1 diabetes mellitus on insulin therapy ([place])   . Valvular heart disease    a. Mild MR/TR by TEE 08/2013.   Past Surgical History:  Procedure Laterality Date  . ABLATION  09-23-2013   CTI by Dr Lovena Le  . APPENDECTOMY    . ATRIAL FLUTTER ABLATION N/A 09/23/2013   Procedure: ATRIAL FLUTTER ABLATION;  Surgeon: Evans Lance, MD;  Location: [facility] CATH LAB;  Service: Cardiovascular;  Laterality: N/A;  . CARDIOVERSION N/A 08/16/2013   Procedure: CARDIOVERSION;  Surgeon: Thayer Headings, MD;  Location: [facility];  Service: Cardiovascular;  Laterality: N/A;  spoke with [name]  . CATARACT EXTRACTION Bilateral   . COLONOSCOPY    . CORONARY ANGIOPLASTY WITH STENT PLACEMENT    . MYOMECTOMY  1977  . PELVIC LAPAROSCOPY    . TEE WITHOUT CARDIOVERSION N/A 08/16/2013   Procedure: TRANSESOPHAGEAL ECHOCARDIOGRAM (TEE);  Surgeon: Thayer Headings, MD;  Location: [facility] ENDOSCOPY;  Service: Cardiovascular;  Laterality: N/A;   Social History   Social History  . Marital status: Married    Spouse name: N/A  . Number of children: 2  . 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 Smoking status: Never Smoker  . Smokeless tobacco: Never Used  . Alcohol use 3.0 oz/week     5 Glasses of wine per week     Comment: on weekends  . Drug use: No  . Sexual activity: Yes    Birth control/ protection: Post-menopausal   Other Topics Concern  . None   Social History Narrative  . None   Family History  Problem Relation Age of Onset  . CAD Father 19       died age 53  . CAD Brother 82       small vessel disease  . Atrial fibrillation Mother   . Diabetes Mother   . Hypertension Mother   . CAD Cousin        paternal  . CAD Paternal Grandfather        early onset  . Colon cancer Neg Hx    Allergies  Allergen Reactions  . Sulfa Antibiotics Rash   Prior to Admission medications   Medication Sig Start Date End Date Taking? Authorizing Provider  aspirin 325 MG tablet Take 325 mg by mouth 2 (two) times a week. Take 30 minutes before niacin   Yes [provider]  Calcium Carbonate-Vitamin D (CALCIUM + D PO) Take 600 mg by mouth 2 (two) times daily.    Yes [provider]  cholecalciferol (VITAMIN D) 1000 units tablet Take 1,000 Units by mouth 2 (two) times daily.  Yes [provider]  clopidogrel (PLAVIX) 75 MG tablet Take 1 tablet (75 mg total) by mouth daily. 12/11/16  Yes Elayne Snare, MD  Insulin Aspart (INSULIN PUMP) 100 unit/ml SOLN Inject into the skin. Novolog Vial, max 200 units per day with pump   Yes [provider]  insulin aspart (NOVOLOG) 100 UNIT/ML injection Use Max 200 units per day in insulin pump Patient taking differently: Use Max 200 units per day in insulin pump Basal 35.8 units per 24 hours Bolus: midnight -5pm 1 unit per 15 carbs, 5pm -midnight = 1 per 7 carbs 12/20/16  Yes Elayne Snare, MD  metFORMIN (GLUCOPHAGE-XR) 500 MG 24 hr tablet Take 2 tablets by mouth  daily 12/16/16  Yes Elayne Snare, MD  metoprolol succinate (TOPROL-XL) 100 MG 24 hr tablet TAKE 1 TABLET (100 MG TOTAL) BY MOUTH DAILY. 03/12/17  Yes [name], Wonda Cheng, MD  niacin (NIASPAN) 1000 MG CR tablet Take 1 tablet by mouth at  bedtime Patient  taking differently: Take 1,000 mg by mouth 2 (two) times a week.  12/20/16  Yes Elayne Snare, MD  nitroGLYCERIN (NITROSTAT) 0.4 MG SL tablet Place 1 tablet (0.4 mg total) under the tongue every 5 (five) minutes as needed for chest pain. 11/13/16  Yes [name], Wonda Cheng, MD  pioglitazone (ACTOS) 15 MG tablet Take 1 tablet (15 mg total) by mouth daily. 02/13/17  Yes Elayne Snare, MD  rosuvastatin (CRESTOR) 40 MG tablet Take 1 tablet (40 mg total) by mouth daily. 12/20/16  Yes Elayne Snare, MD  traMADol (ULTRAM) 50 MG tablet Take 1 tablet (50 mg total) by mouth every 6 (six) hours as needed for moderate pain or severe pain. Patient taking differently: Take 50 mg by mouth 3 (three) times daily as needed for moderate pain or severe pain.  03/22/17  Yes [name], PA-C  Vitamin D, Ergocalciferol, (DRISDOL) 50000 units CAPS capsule Take 1 capsule (50,000 Units total) by mouth every 7 (seven) days. Patient taking differently: Take 50,000 Units by mouth every Wednesday.  03/24/17  Yes Hulan Saas M, DO  glucose blood (BAYER CONTOUR TEST) test strip Use as instructed to check sugar 6 times daily. Dx Code E10.65 07/04/16   Elayne Snare, MD  ibuprofen (ADVIL,MOTRIN) 200 MG tablet Take 400 mg by mouth 2 (two) times daily as needed for headache or moderate pain.     [provider]     Positive ROS: All other systems have been reviewed and were otherwise negative with the exception of those mentioned in the HPI and as above.  Physical Exam: General: Alert, no acute distress Cardiovascular: No pedal edema Respiratory: No cyanosis, no use of accessory musculature GI: No organomegaly, abdomen is soft and non-tender Skin: No lesions in the area of chief complaint Neurologic: Sensation intact distally Psychiatric: Patient is competent for consent with normal mood and affect Lymphatic: No axillary or cervical lymphadenopathy  MUSCULOSKELETAL: Examination of the left knee reveals multiple healing superficial  abrasions to the anterior knee. Her swelling and bruising are markedly improved. She cannot perform a straight leg raise. She has an appropriate amount of lower extremity edema. She is neurovascularly intact distally.  Assessment: Left knee patella fracture  Plan: Plan for Procedure(s): OPEN REDUCTION INTERNAL (ORIF) FIXATION PATELLA  The risks benefits and alternatives were discussed with the patient including but not limited to the risks of nonoperative treatment, versus surgical intervention including infection, bleeding, malunion, nonunion, stiffness, posttraumatic arthritis, wound healing complications, nerve injury, blood clots, cardiopulmonary complications, morbidity,  mortality, among others, and they were willing to proceed.   Ebonie Westerlund, Horald Pollen, MD Cell [phone]   04/11/2017 7:33 AM

## 2017-04-11 NOTE — Op Note (Signed)
NAME:  [name], [name] NO.:  [id number]  MEDICAL RECORD NO.:  [id number]  LOCATION:                               FACILITY:  [facility]  PHYSICIAN:  Rod Can, MD     DATE OF BIRTH:  09-Sep-1946  DATE OF PROCEDURE:  04/11/2017 DATE OF DISCHARGE:                              OPERATIVE REPORT   SURGEON:  Rod Can, MD.  ASSISTANT:  Staff.  PREOPERATIVE DIAGNOSIS:  Comminuted left patella fracture.  POSTOPERATIVE DIAGNOSIS:  Comminuted left patella fracture.  PROCEDURE PERFORMED:  Open reduction and internal fixation of left patella.  IMPLANTS:  Biomet Ace cannulated screws 4.0 mm x2.  ANESTHESIA:  Adductor canal block plus general.  EBL:  20 mL.  TOURNIQUET TIME:  34 minutes.  ANTIBIOTICS:  2 g Ancef.  COMPLICATIONS:  None.  TUBES AND DRAINS:  Prevena wound VAC.  DISPOSITION:  Stable to PACU.  INDICATIONS:  The patient is a 71 year old female, who had a ground level fall and sustained a left patella fracture.  She had a significant soft tissue injury with abrasions.  We have been letting these heal prior to proceeding with surgery.  Once the soft tissue envelope was appropriate, she was indicated for ORIF of the left patella.  We discussed the risks, benefits, and alternatives, she elected to proceed.  DESCRIPTION OF PROCEDURE IN DETAIL:  I identified the patient in the holding area.  The surgical site was marked.  Adductor canal block was placed by Anesthesia.  She was taken to the operating room.  General anesthesia was induced.  Nonsterile tourniquet was applied.  She was positioned on a bone foam.  Left lower extremity prepped and draped in normal sterile surgical fashion.  Time-out was called verifying side and site of surgery.  She did have some abrasions over the anterolateral aspect of the knee.  I made a curvilinear medially based incision to avoid this area.  I created full-thickness skin flaps.  I identified the fracture.  She did  have mostly a transverse patella fracture at the junction of the middle and superior thirds.  The retinacula were intact on either side.  I opened up the fracture site.  I irrigated the knee. I then reduced the fracture with the knee in full extension.  I held the reduction with a clamp.  I placed 2 retrograde guidewires for cannulated screws.  I placed these guidewires perpendicular to the main fracture fragment on the AP view.  The wires were sequentially measured, drilled the near cortex, placed the cannulated screws.  I then placed the #5 FiberWire through the screws with Hewson suture passer.  Figure-of-eight technique suture was tightened.  The reduction was acceptable.  I flexed the knee down to about 80 degrees.  There was no gapping.  The wound was copiously irrigated with saline.  I closed the deep dermal layer with 2- 0 Vicryl, skin with staples.  I applied a Prevena wound VAC, bulky dressing was applied followed by a knee immobilizer.  Tourniquet was let down.  She was extubated and taken to the PACU, in stable condition. Sponge, needle, and instrument counts were correct at the end of the case  x2.  There were no known complications.  Postoperatively, we will admit the patient to the hospital for overnight observation.  She may weightbear as tolerated in the knee immobilizer. She will maintain the knee immobilizer at all times.  There is to be no flexion of the knee.  We will resume her aspirin and Plavix for DVT prophylaxis.  Upon discharge, we will convert her house back to the [facility].  I will see her in 7 days after discharge from the hospital.    ______________________________ Rod Can, MD   ______________________________ Rod Can, MD    BS/MEDQ  D:  04/11/2017  T:  04/11/2017  Job:  [id number]

## 2017-04-11 NOTE — Anesthesia Procedure Notes (Signed)
Anesthesia Regional Block: Adductor canal block   Pre-Anesthetic Checklist: ,, timeout performed, Correct Patient, Correct Site, Correct Laterality, Correct Procedure, Correct Position, site marked, Risks and benefits discussed,  Surgical consent,  Pre-op evaluation,  At surgeon's request and post-op pain management  Laterality: Left  Prep: chloraprep       Needles:  Injection technique: Single-shot  Needle Type: Stimulator Needle - 80     Needle Length: 10cm  Needle Gauge: 21     Additional Needles:   Procedures: ultrasound guided,,,,,,,,  Narrative:  Start time: 04/11/2017 7:11 AM End time: 04/11/2017 7:21 AM Injection made incrementally with aspirations every 5 mL.  Performed by: Personally

## 2017-04-11 NOTE — Progress Notes (Deleted)
   Subjective:  Patient reports pain as mild to moderate.  Denies N/V/CP/SOB.  Objective:   VITALS:   Vitals:   04/11/17 0945 04/11/17 1000 04/11/17 1015 04/11/17 1039  BP: (!) 127/58 129/61 123/63 (!) 120/56  Pulse: 72 67 73 65  Resp: 20 11 16    Temp:   97.6 F (36.4 C) 97.6 F (36.4 C)  TempSrc:    Oral  SpO2: 97% 97% 99% 100%  Weight:        NAD ABD soft Sensation intact distally Intact pulses distally Dorsiflexion/Plantar flexion intact Incision: dressing C/D/I Compartment soft Prevena intact   Lab Results  Component Value Date   WBC 5.5 04/11/2017   HGB 12.8 04/11/2017   HCT 39.5 04/11/2017   MCV 91.6 04/11/2017   PLT 321 04/11/2017   BMET    Component Value Date/Time   NA 138 04/11/2017 0642   K 3.9 04/11/2017 0642   CL 103 04/11/2017 0642   CO2 29 04/11/2017 0642   GLUCOSE 180 (H) 04/11/2017 0642   BUN 8 04/11/2017 0642   CREATININE 0.75 04/11/2017 0642   CREATININE 0.65 09/14/2015 0805   CALCIUM 8.6 (L) 04/11/2017 0642   GFRNONAA >60 04/11/2017 0642   GFRAA >60 04/11/2017 8592     Assessment/Plan: Day of Surgery   Principal Problem:   Displaced comminuted fracture of left patella, initial encounter for closed fracture Active Problems:   Left patella fracture   WBAT with walker Knee immobilizer at all times DVT ppx: ASA + plavix, SCds, TEDs PT/OT Dispo: D/C home with HHPT, convert to portable VAC upon d/c   Zana Biancardi, Horald Pollen 04/11/2017, 11:02 AM   Rod Can, MD Cell [phone]

## 2017-04-11 NOTE — Discharge Instructions (Signed)
Dr. Rod Can Adult Hip & Knee Specialist [facility] [address] [place], [place] [address] [phone]   POSTOPERATIVE DIRECTIONS    Rehabilitation, Guidelines Following Surgery   WEIGHT BEARING Weight bearing as tolerated with assist device (walker, cane, etc) as directed, use it as long as suggested by your surgeon or therapist, typically at least 4-6 weeks. Wear Knee immobilizer at all times. Do not bend your knee.  HOME CARE INSTRUCTIONS  Remove items at home which could result in a fall. This includes throw rugs or furniture in walking pathways.  Continue medications as instructed at time of discharge.  You may have some home medications which will be placed on hold until you complete the course of blood thinner medication. Do not put on socks or shoes without following the instructions of your caregivers.   Sit on chairs with arms. Use the chair arms to help push yourself up when arising.  Arrange for the use of a toilet seat elevator so you are not sitting low.   Walk with walker as instructed.  You may resume a sexual relationship in 6 weeks or when given the OK by your caregiver.  Use walker as long as suggested by your caregivers.  Avoid periods of inactivity such as sitting longer than an hour when not asleep. This helps prevent blood clots.  You may return to work once you are cleared by Engineer, production.  Do not drive a car for 6 weeks or until released by your surgeon.  Do not drive while taking narcotics.  Wear elastic stockings for two weeks following surgery during the day but you may remove then at night.  Make sure you keep all of your appointments after your operation with all of your doctors and caregivers. You should call the office at the above phone number and make an appointment for approximately two weeks after the date of your surgery. Please pick up a stool softener and laxative for home use as long as you are requiring  pain medications.  ICE to the affected knee every three hours for 30 minutes at a time and then as needed for pain and swelling. Continue to use ice on the hip for pain and swelling from surgery. You may notice swelling that will progress down to the foot and ankle.  This is normal after surgery.  Elevate the leg when you are not up walking on it.   It is important for you to complete the blood thinner medication as prescribed by your doctor.  Continue to use the breathing machine which will help keep your temperature down.  It is common for your temperature to cycle up and down following surgery, especially at night when you are not up moving around and exerting yourself.  The breathing machine keeps your lungs expanded and your temperature down.  RANGE OF MOTION AND STRENGTHENING EXERCISES  These exercises are designed to help you keep full movement of your hip joint. Follow your caregiver's or physical therapist's instructions. Perform all exercises about fifteen times, three times per day or as directed. Exercise both hips, even if you have had only one joint replacement. These exercises can be done on a training (exercise) mat, on the floor, on a table or on a bed. Use whatever works the best and is most comfortable for you. Use music or television while you are exercising so that the exercises are a pleasant break in your day. This will make your life better with the exercises  acting as a break in routine you can look forward to.  Lying on your back, slowly slide your foot toward your buttocks, raising your knee up off the floor. Then slowly slide your foot back down until your leg is straight again.  Lying on your back spread your legs as far apart as you can without causing discomfort.  Lying on your side, raise your upper leg and foot straight up from the floor as far as is comfortable. Slowly lower the leg and repeat.  Lying on your back, tighten up the muscle in the front of your thigh  (quadriceps muscles). You can do this by keeping your leg straight and trying to raise your heel off the floor. This helps strengthen the largest muscle supporting your knee.  Lying on your back, tighten up the muscles of your buttocks both with the legs straight and with the knee bent at a comfortable angle while keeping your heel on the floor.   SKILLED REHAB INSTRUCTIONS: If the patient is transferred to a skilled rehab facility following release from the hospital, a list of the current medications will be sent to the facility for the patient to continue.  When discharged from the skilled rehab facility, please have the facility set up the patient's [facility] prior to being released. Also, the skilled facility will be responsible for providing the patient with their medications at time of release from the facility to include their pain medication and their blood thinner medication. If the patient is still at the rehab facility at time of the two week follow up appointment, the skilled rehab facility will also need to assist the patient in arranging follow up appointment in our office and any transportation needs.  MAKE SURE YOU:  Understand these instructions.  Will watch your condition.  Will get help right away if you are not doing well or get worse.  Pick up stool softner and laxative for home use following surgery while on pain medications. Keep dressing clean and dry. Do NOT remove. Continue to use ice for pain and swelling after surgery. Do not use any lotions or creams on the incision until instructed by your surgeon.

## 2017-04-11 NOTE — Evaluation (Signed)
Occupational Therapy Evaluation Patient Details Name: [name] MRN: [id number] DOB: 05/30/1946 Today's Date: 04/11/2017    History of Present Illness Pt admit for left patella fracture with ORIF.  PMH: aflutter, CAD with stent, ablation, cardioversion, DM with insulin pump.    Clinical Impression   Patient presenting with decreased I in self care, balance, functional transfer/mobility, and safety awareness. Patient was I prior to incident. Patient currently functioning at supervision - min A. Patient will benefit from acute OT to increase overall independence in the areas of ADLs, functional mobility, and safety awareness in order to safely discharge home.    Follow Up Recommendations  No OT follow up    Equipment Recommendations  None recommended by OT    Recommendations for Other Services       Precautions / Restrictions Precautions Precautions: Fall;Knee Precaution Booklet Issued: Yes (comment) Precaution Comments: VAC in place Required Braces or Orthoses: Knee Immobilizer - Left Knee Immobilizer - Left: On at all times Restrictions Weight Bearing Restrictions: Yes LLE Weight Bearing: Weight bearing as tolerated      Mobility Bed Mobility Overal bed mobility: Needs Assistance Bed Mobility: Supine to Sit     Supine to sit: Min guard     General bed mobility comments: seated in recliner chair upon entering the room.   Transfers Overall transfer level: Needs assistance Equipment used: Rolling walker (2 wheeled) Transfers: Sit to/from Stand Sit to Stand: Supervision         General transfer comment: verbal cues for hand placement    Balance Overall balance assessment: Needs assistance Sitting-balance support: No upper extremity supported;Feet supported Sitting balance-Leahy Scale: Good     Standing balance support: Bilateral upper extremity supported;During functional activity Standing balance-Leahy Scale: Poor Standing balance comment: relied on RW  for support today and min guard assist.                            ADL either performed or assessed with clinical judgement   ADL Overall ADL's : Needs assistance/impaired                                       General ADL Comments: min guard for toilet transfer onto elevated toilet seate with use of RW. Clothing management and hygiene with steady assistance in standing for balance.      Vision Baseline Vision/History: Wears glasses Wears Glasses: Reading only       Perception     Praxis      Pertinent Vitals/Pain Pain Assessment: No/denies pain     Hand Dominance Right   Extremity/Trunk Assessment Upper Extremity Assessment Upper Extremity Assessment: Overall WFL for tasks assessed   Lower Extremity Assessment Lower Extremity Assessment: Defer to PT evaluation LLE: Unable to fully assess due to immobilization   Cervical / Trunk Assessment Cervical / Trunk Assessment: Normal   Communication Communication Communication: No difficulties   Cognition Arousal/Alertness: Awake/alert Behavior During Therapy: WFL for tasks assessed/performed Overall Cognitive Status: Within Functional Limits for tasks assessed                                     General Comments  VAc in place    Exercises Exercises: General Lower Extremity General Exercises - Lower Extremity Ankle Circles/Pumps: AROM;Both;10 reps;Supine  Shoulder Instructions      Home Living Family/patient expects to be discharged to:: Private residence Living Arrangements: Spouse/significant other Available Help at Discharge: Family;Available PRN/intermittently Type of Home: House Home Access: Stairs to enter [employer] of Steps: 6 Entrance Stairs-Rails: Right;Left;Can reach both Home Layout: One level     Bathroom Shower/Tub: Occupational psychologist: Standard Bathroom Accessibility: Yes   Home Equipment: Crutches;Toilet riser;Shower seat  - built in   Additional Comments: states she goes up and down steps with rail sideways      Prior Functioning/Environment Level of Independence: Independent with assistive device(s)        Comments: had been using crutches        OT Problem List: Decreased strength;Decreased activity tolerance;Impaired balance (sitting and/or standing);Decreased safety awareness;Decreased knowledge of precautions;Decreased knowledge of use of DME or AE      OT Treatment/Interventions: Self-care/ADL training;Therapeutic exercise;Energy conservation;DME and/or AE instruction;Therapeutic activities;Patient/family education;Balance training    OT Goals(Current goals can be found in the care plan section) Acute Rehab OT Goals Patient Stated Goal: to go home OT Goal Formulation: With patient Time For Goal Achievement: 04/25/17 Potential to Achieve Goals: Good ADL Goals Pt Will Perform Grooming: Independently;sitting Pt Will Perform Upper Body Bathing: with modified independence;sitting Pt Will Perform Lower Body Bathing: with supervision;sit to/from stand Pt Will Perform Upper Body Dressing: with modified independence;sitting Pt Will Perform Lower Body Dressing: with supervision;sit to/from stand Pt Will Transfer to Toilet: with supervision Pt Will Perform Toileting - Clothing Manipulation and hygiene: with supervision;sit to/from stand Pt Will Perform Tub/Shower Transfer: Shower transfer;with supervision;ambulating;shower seat;rolling walker  OT Frequency: Min 2X/week   Barriers to D/C:    none known at this time       Co-evaluation              AM-PAC PT "6 Clicks" Daily Activity     Outcome Measure Help from another person eating meals?: None Help from another person taking care of personal grooming?: None Help from another person toileting, which includes using toliet, bedpan, or urinal?: A Little Help from another person bathing (including washing, rinsing, drying)?: A Little Help  from another person to put on and taking off regular upper body clothing?: A Little Help from another person to put on and taking off regular lower body clothing?: A Little 6 Click Score: 20   End of Session Equipment Utilized During Treatment: Rolling walker;Left knee immobilizer Nurse Communication: Mobility status  Activity Tolerance: Patient tolerated treatment well Patient left: in chair;with call bell/phone within reach  OT Visit Diagnosis: Muscle weakness (generalized) (M62.81)                Time: 6270-3500 OT Time Calculation (min): 34 min Charges:  OT General Charges $OT Visit: 1 Procedure OT Evaluation $OT Eval Low Complexity: 1 Procedure OT Treatments $Self Care/Home Management : 8-22 mins G-Codes:       Gypsy Decant, MS, OTR/L, CBIS 04/11/2017, 2:28 PM

## 2017-04-11 NOTE — Anesthesia Postprocedure Evaluation (Signed)
Anesthesia Post Note  Patient: [name]  Procedure(s) Performed: Procedure(s) (LRB): OPEN REDUCTION INTERNAL (ORIF) FIXATION PATELLA LEFT KNEE (Left) APPLICATION OF WOUND VAC LEFT KNEE (Left)     Patient location during evaluation: PACU Anesthesia Type: Regional and General Level of consciousness: sedated Pain management: pain level controlled Vital Signs Assessment: post-procedure vital signs reviewed and stable Respiratory status: spontaneous breathing and respiratory function stable Cardiovascular status: stable Anesthetic complications: no    Last Vitals:  Vitals:   04/11/17 1000 04/11/17 1015  BP: 129/61   Pulse: 67 73  Resp: 11 16  Temp:      Last Pain:  Vitals:   04/11/17 1015  TempSrc:   PainSc: 3     LLE Motor Response: Purposeful movement;Responds to commands (04/11/17 1015) LLE Sensation: No tingling;No pain;No numbness (04/11/17 1015)          [place]

## 2017-04-11 NOTE — Anesthesia Procedure Notes (Signed)
Procedure Name: LMA Insertion Date/Time: 04/11/2017 7:47 AM Performed by: Rejeana Brock L Pre-anesthesia Checklist: Patient identified, Emergency Drugs available, Suction available and Patient being monitored Patient Re-evaluated:Patient Re-evaluated prior to inductionOxygen Delivery Method: Circle System Utilized Preoxygenation: Pre-oxygenation with 100% oxygen Intubation Type: IV induction Ventilation: Mask ventilation without difficulty LMA: LMA inserted LMA Size: 4.0 Number of attempts: 1 Airway Equipment and Method: Bite block Placement Confirmation: positive ETCO2 and breath sounds checked- equal and bilateral Tube secured with: Tape Dental Injury: Teeth and Oropharynx as per pre-operative assessment

## 2017-04-11 NOTE — Brief Op Note (Signed)
04/11/2017  9:02 AM  PATIENT:  [name]  71 y.o. female  PRE-OPERATIVE DIAGNOSIS:  Left knee patella fracture  POST-OPERATIVE DIAGNOSIS:  Left knee patella fracture  PROCEDURE:  Procedure(s): OPEN REDUCTION INTERNAL (ORIF) FIXATION PATELLA (Left)  SURGEON:  Surgeon(s) and Role:    * Rod Can, MD - Primary  PHYSICIAN ASSISTANT:   ASSISTANTS: none   ANESTHESIA:   regional and general  EBL:  Total I/O In: 1000 [I.V.:1000] Out: 20 [Blood:20]  BLOOD ADMINISTERED:none  DRAINS: prevena   LOCAL MEDICATIONS USED:  NONE  SPECIMEN:  No Specimen  DISPOSITION OF SPECIMEN:  N/A  COUNTS:  YES  TOURNIQUET:   Total Tourniquet Time Documented: Thigh (Left) - 34 minutes Total: Thigh (Left) - 34 minutes   DICTATION: .Other Dictation: Dictation Number [id number]  PLAN OF CARE: Admit for overnight observation  PATIENT DISPOSITION:  PACU - hemodynamically stable.   Delay start of Pharmacological VTE agent (>24hrs) due to surgical blood loss or risk of bleeding: no

## 2017-04-11 NOTE — Transfer of Care (Signed)
Immediate Anesthesia Transfer of Care Note  Patient: [name]  Procedure(s) Performed: Procedure(s): OPEN REDUCTION INTERNAL (ORIF) FIXATION PATELLA (Left)  Patient Location: PACU  Anesthesia Type:GA combined with regional for post-op pain  Level of Consciousness: awake, alert  and oriented  Airway & Oxygen Therapy: Patient Spontanous Breathing and Patient connected to nasal cannula oxygen  Post-op Assessment: Report given to RN, Post -op Vital signs reviewed and stable and Patient moving all extremities X 4  Post vital signs: Reviewed and stable  Last Vitals:  Vitals:   04/11/17 0615  BP: (!) 148/57  Pulse: 68  Resp: 18  Temp: 36.6 C    Last Pain:  Vitals:   04/11/17 0651  TempSrc:   PainSc: 4       Patients Stated Pain Goal: 4 (58/72/76 1846)  Complications: No apparent anesthesia complications

## 2017-04-11 NOTE — Progress Notes (Addendum)
Inpatient Diabetes Program Recommendations  AACE/ADA: New Consensus Statement on Inpatient Glycemic Control (2015)  Target Ranges:  Prepandial:   less than 140 mg/dL      Peak postprandial:   less than 180 mg/dL (1-2 hours)      Critically ill patients:  140 - 180 mg/dL   Lab Results  Component Value Date   GLUCAP 96 04/11/2017   HGBA1C 8.3 (H) 12/20/2016    Review of Glycemic Control  Diabetes history: DM 1 Outpatient Diabetes medications: Insulin Pump with Novolog Current orders for Inpatient glycemic control: Insulin Pump order set  Spoke with patient regarding diabetes and home regimen for diabetes management.  Patient states that She  is followed by Dr. [name] for diabetes management. Patient uses Novolog insulin in her pump. Patient has insulin pump in the bed with her connected in the left lower quadrant of the abdomen, Patient is also wearing a Dexcom continuous glucose monitor in the right lower quadrant of the abdomen. Patient is due to change her insulin pump site today. Patient has all of her pump supplies and insulin at bedtime.  Current insulin pump settings are as follows:  Basal insulin  12A-5A  0.8 units/hour 5A-8A  1.9 units/hour 8A-11A  1.7 units/hour 11A-5P 1 units/hour 5P-7P  2.0 units/hour 7P-10P 2.6 units/hour 10P-12A  1.6 units/hour  Total daily basal insulin: 35.8 units/24 hours  Carb Coverage 12A-5P 1:15 1 unit for every 15 grams of carbohydrates 5P-12A 1:7 1 units for every 7 grams of carbohydrates  Insulin Sensitivity 12A 1:35 1 unit drops blood glucose 35 mg/dl 06A 1:30 1 unit drops blood glucose 30 mg/dl  Target Glucose Goals 12A 100-120 mg/dl   Patient reports her last A1c was around 7%. Last A1c in Computer was 8.3% on 12/20/16  NURSING: Once insulin pump order set is ordered please print off the Patient insulin pump contract and flow sheet. The insulin pump contract should be signed by the patient and then placed in the chart. The  patient insulin pump flow sheet will be completed by the patient at the bedside and the RN caring for the patient will use the patient's flow sheet to document in the [facility]. RN will need to complete the Nursing Insulin Pump Flowsheet at least once a shift. Patient will need to keep extra insulin pump supplies at the bedside at all times.   Will follow trends while here.  Thanks,  Tama Headings RN, MSN, [facility] Inpatient Diabetes Coordinator Team Pager [phone] (8a-5p)

## 2017-04-11 NOTE — Op Note (Deleted)
  The note originally documented on this encounter has been moved the the encounter in which it belongs.  

## 2017-04-12 DIAGNOSIS — Z9641 Presence of insulin pump (external) (internal): Secondary | ICD-10-CM | POA: Diagnosis not present

## 2017-04-12 DIAGNOSIS — Z833 Family history of diabetes mellitus: Secondary | ICD-10-CM | POA: Diagnosis not present

## 2017-04-12 DIAGNOSIS — E109 Type 1 diabetes mellitus without complications: Secondary | ICD-10-CM | POA: Diagnosis not present

## 2017-04-12 DIAGNOSIS — I2510 Atherosclerotic heart disease of native coronary artery without angina pectoris: Secondary | ICD-10-CM | POA: Diagnosis not present

## 2017-04-12 DIAGNOSIS — Z8249 Family history of ischemic heart disease and other diseases of the circulatory system: Secondary | ICD-10-CM | POA: Diagnosis not present

## 2017-04-12 DIAGNOSIS — S82042A Displaced comminuted fracture of left patella, initial encounter for closed fracture: Secondary | ICD-10-CM | POA: Diagnosis not present

## 2017-04-12 DIAGNOSIS — S82002A Unspecified fracture of left patella, initial encounter for closed fracture: Secondary | ICD-10-CM | POA: Diagnosis not present

## 2017-04-12 LAB — HEMOGLOBIN A1C
Hgb A1c MFr Bld: 7.5 % — ABNORMAL HIGH (ref 4.8–5.6)
Mean Plasma Glucose: 169 mg/dL

## 2017-04-12 LAB — GLUCOSE, CAPILLARY
Glucose-Capillary: 74 mg/dL (ref 65–99)
Glucose-Capillary: 121 mg/dL — ABNORMAL HIGH (ref 65–99)

## 2017-04-12 MED ORDER — ONDANSETRON HCL 4 MG PO TABS: 4.0000 mg | ORAL_TABLET | Freq: Four times a day (QID) | ORAL | 0 refills | Status: DC | PRN

## 2017-04-12 MED ORDER — ASPIRIN 81 MG PO CHEW: 81.0000 mg | CHEWABLE_TABLET | Freq: Every day | ORAL | Status: DC

## 2017-04-12 MED ORDER — HYDROCODONE-ACETAMINOPHEN 5-325 MG PO TABS: 1.0000 | ORAL_TABLET | ORAL | 0 refills | Status: DC | PRN

## 2017-04-12 MED ORDER — ASPIRIN 81 MG PO CHEW
81.0000 mg | CHEWABLE_TABLET | Freq: Every day | ORAL | 2 refills | Status: DC
Start: 2017-04-12 — End: 2017-08-18

## 2017-04-12 NOTE — Progress Notes (Signed)
Physical Therapy Treatment Patient Details Name: [name] MRN: [id number] DOB: 1946-05-28 Today's Date: 04/12/2017    History of Present Illness Pt admit for left patella fracture with ORIF.  PMH: aflutter, CAD with stent, ablation, cardioversion, DM with insulin pump.     PT Comments    Patient is making good progress with PT.  From a mobility standpoint anticipate patient will be ready for DC home when medically ready and receives RW.   Follow Up Recommendations  Home health PT;Supervision - [facility])     Equipment Recommendations  RW   Recommendations for Other Services       Precautions / Restrictions Precautions Precautions: Fall;Knee Precaution Comments: VAC in place Required Braces or Orthoses: Knee Immobilizer - Left Knee Immobilizer - Left: On at all times Restrictions Weight Bearing Restrictions: Yes LLE Weight Bearing: Weight bearing as tolerated    Mobility  Bed Mobility               General bed mobility comments: pt OOB in chair upon arrival  Transfers Overall transfer level: Modified independent Equipment used: Rolling walker (2 wheeled) Transfers: Sit to/from Stand              Ambulation/Gait Ambulation/Gait assistance: Supervision Ambulation Distance (Feet): 200 Feet Assistive device: Rolling walker (2 wheeled) Gait Pattern/deviations: Decreased stance time - left;Decreased weight shift to left;Antalgic;Step-through pattern;Decreased step length - right;Trunk flexed     General Gait Details: mildly antalgic gait; cues for posture and proximity of RW; slow, steady gait   Stairs Stairs: Yes   Stair Management: One rail Left;Step to pattern;Sideways Number of Stairs: 5 General stair comments: cues for sequencing and technique  Wheelchair Mobility    Modified Rankin (Stroke Patients Only)       Balance Overall balance assessment: Needs assistance Sitting-balance support: No upper extremity supported;Feet  supported Sitting balance-Leahy Scale: Good     Standing balance support: Bilateral upper extremity supported;During functional activity Standing balance-Leahy Scale: Poor                              Cognition Arousal/Alertness: Awake/alert Behavior During Therapy: WFL for tasks assessed/performed Overall Cognitive Status: Within Functional Limits for tasks assessed                                        Exercises      General Comments General comments (skin integrity, edema, etc.): husband present for session      Pertinent Vitals/Pain Pain Assessment: Faces Faces Pain Scale: No hurt    Home Living                      Prior Function            PT Goals (current goals can now be found in the care plan section) Acute Rehab PT Goals Patient Stated Goal: to go home PT Goal Formulation: With patient Time For Goal Achievement: 04/18/17 Potential to Achieve Goals: Good Progress towards PT goals: Progressing toward goals    Frequency    Min 6X/week      PT Plan Current plan remains appropriate    Co-evaluation              AM-PAC PT "6 Clicks" Daily Activity  Outcome Measure  Difficulty turning over in bed (including adjusting bedclothes, sheets  and blankets)?: None Difficulty moving from lying on back to sitting on the side of the bed? : None Difficulty sitting down on and standing up from a chair with arms (e.g., wheelchair, bedside commode, etc,.)?: None Help needed moving to and from a bed to chair (including a wheelchair)?: A Little Help needed walking in hospital room?: A Little Help needed climbing 3-5 steps with a railing? : A Little 6 Click Score: 21    End of Session Equipment Utilized During Treatment: Gait belt;Left knee immobilizer Activity Tolerance: Patient tolerated treatment well Patient left: in chair;with call bell/phone within reach;with family/visitor present Nurse Communication: Mobility  status PT Visit Diagnosis: Muscle weakness (generalized) (M62.81)     Time: 8891-6945 PT Time Calculation (min) (ACUTE ONLY): 23 min  Charges:  $Gait Training: 23-37 mins                    G Codes:       Earney Navy, PTA Pager: [phone]     Darliss Cheney 04/12/2017, 12:39 PM

## 2017-04-12 NOTE — Progress Notes (Signed)
   Subjective:  Patient reports pain as mild to moderate.  Denies N/V/CP/SOB.  Objective:   VITALS:   Vitals:   04/11/17 1424 04/11/17 2030 04/12/17 0050 04/12/17 0347  BP: (!) 112/51 (!) 123/49 (!) 124/54 (!) 123/54  Pulse: 62 74 69 68  Resp:  18 18 18   Temp: 97.7 F (36.5 C) 98.3 F (36.8 C) 98.2 F (36.8 C) 98.4 F (36.9 C)  TempSrc: Oral Oral Oral Oral  SpO2: 100% 97% 96% 97%  Weight:        NAD ABD soft Sensation intact distally Intact pulses distally Dorsiflexion/Plantar flexion intact Incision: dressing C/D/I Compartment soft   Lab Results  Component Value Date   WBC 5.5 04/11/2017   HGB 12.8 04/11/2017   HCT 39.5 04/11/2017   MCV 91.6 04/11/2017   PLT 321 04/11/2017   BMET    Component Value Date/Time   NA 138 04/11/2017 0642   K 3.9 04/11/2017 0642   CL 103 04/11/2017 0642   CO2 29 04/11/2017 0642   GLUCOSE 180 (H) 04/11/2017 0642   BUN 8 04/11/2017 0642   CREATININE 0.75 04/11/2017 0642   CREATININE 0.65 09/14/2015 0805   CALCIUM 8.6 (L) 04/11/2017 0642   GFRNONAA >60 04/11/2017 0642   GFRAA >60 04/11/2017 5277     Assessment/Plan: 1 Day Post-Op   Principal Problem:   Displaced comminuted fracture of left patella, initial encounter for closed fracture Active Problems:   Left patella fracture   WBAT with walker KI at all times Do Not bend the knee ASA / Plavix, SCDs, TEDs PT/OT D/C home today, convert VAC unit to portable prevena - no not change dressing   [name], Horald Pollen 04/12/2017, 12:10 PM   Rod Can, MD Cell [phone]

## 2017-04-12 NOTE — Progress Notes (Signed)
Occupational Therapy Treatment Patient Details Name: [name] MRN: [id number] DOB: Jun 19, 1946 Today's Date: 04/12/2017    History of present illness Pt admit for left patella fracture with ORIF.  PMH: aflutter, CAD with stent, ablation, cardioversion, DM with insulin pump.    OT comments  Pt progressing towards established goals. Provided education on safe shower transfer techniques. Pt performed walk-in shower transfer with supervision. Pt demonstrated good understanding. Reviewed LB dressing techniques. Continue to recommend dc home once medically stable per physician. Answered all pt questions and provided required education. All acute OT needs met. Will sign off.    Follow Up Recommendations  No OT follow up    Equipment Recommendations  None recommended by OT    Recommendations for Other Services      Precautions / Restrictions Precautions Precautions: Fall;Knee Precaution Booklet Issued: Yes (comment) Precaution Comments: VAC in place Required Braces or Orthoses: Knee Immobilizer - Left Knee Immobilizer - Left: On at all times Restrictions Weight Bearing Restrictions: Yes LLE Weight Bearing: Weight bearing as tolerated       Mobility Bed Mobility Overal bed mobility: Needs Assistance Bed Mobility: Supine to Sit     Supine to sit: Supervision;HOB elevated     General bed mobility comments: Pt performs bed mobility with no physical A.   Transfers Overall transfer level: Needs assistance Equipment used: Rolling walker (2 wheeled) Transfers: Sit to/from Stand Sit to Stand: Supervision         General transfer comment: demosntrates good technique and hand placement    Balance Overall balance assessment: Needs assistance Sitting-balance support: No upper extremity supported;Feet supported Sitting balance-Leahy Scale: Good     Standing balance support: Bilateral upper extremity supported;During functional activity Standing balance-Leahy Scale:  Fair Standing balance comment: Pt able to perform grooming at sink with no UE support or physical A to maintain balance                           ADL either performed or assessed with clinical judgement   ADL Overall ADL's : Needs assistance/impaired     Grooming: Wash/dry hands;Supervision/safety;Standing                 Lower Body Dressing Details (indicate cue type and reason): Reviewed LB dressing techniques - dressing surgical leg first.  Toilet Transfer: Supervision/safety;Ambulation;RW;BSC Toilet Transfer Details (indicate cue type and reason): Pt demosntrates good control during toilet transfer. Toileting- Clothing Manipulation and Hygiene: Supervision/safety;Sit to/from stand;Sitting/lateral lean   Tub/ Shower Transfer: Walk-in shower;Cueing for sequencing;Ambulation;3 in 1;Rolling walker;Supervision/safety (And crutches) Tub/Shower Transfer Details (indicate cue type and reason): Provided education for walkin shower transfer with crutches and RW. Pt demosntrated understanding and performed transfer with supervision for safety. Additionally, provided written instruction on the difference between shower transfer with crutches and RW to insure pt safety at home.    General ADL Comments: Pt demonstrating  good progress in functional performance. Pt performed ADLs and fucntional mobility with supervision.      Vision       Perception     Praxis      Cognition Arousal/Alertness: Awake/alert Behavior During Therapy: WFL for tasks assessed/performed Overall Cognitive Status: Within Functional Limits for tasks assessed                                          Exercises  Shoulder Instructions       General Comments Vac in place    Pertinent Vitals/ Pain       Pain Assessment: Faces Faces Pain Scale: No hurt  Home Living                                          Prior Functioning/Environment               Frequency  Min 2X/week        Progress Toward Goals  OT Goals(current goals can now be found in the care plan section)  Progress towards OT goals: Progressing toward goals  Acute Rehab OT Goals Patient Stated Goal: to go home OT Goal Formulation: With patient Time For Goal Achievement: 04/25/17 Potential to Achieve Goals: Good ADL Goals Pt Will Perform Grooming: Independently;sitting Pt Will Perform Upper Body Bathing: with modified independence;sitting Pt Will Perform Lower Body Bathing: with supervision;sit to/from stand Pt Will Perform Upper Body Dressing: with modified independence;sitting Pt Will Perform Lower Body Dressing: with supervision;sit to/from stand Pt Will Transfer to Toilet: with supervision Pt Will Perform Toileting - Clothing Manipulation and hygiene: with supervision;sit to/from stand Pt Will Perform Tub/Shower Transfer: Shower transfer;with supervision;ambulating;shower seat;rolling walker  Plan Discharge plan remains appropriate    Co-evaluation                 AM-PAC PT "6 Clicks" Daily Activity     Outcome Measure   Help from another person eating meals?: None Help from another person taking care of personal grooming?: None Help from another person toileting, which includes using toliet, bedpan, or urinal?: A Little Help from another person bathing (including washing, rinsing, drying)?: A Little Help from another person to put on and taking off regular upper body clothing?: A Little Help from another person to put on and taking off regular lower body clothing?: None 6 Click Score: 21    End of Session Equipment Utilized During Treatment: Rolling walker;Left knee immobilizer  OT Visit Diagnosis: Muscle weakness (generalized) (M62.81)   Activity Tolerance Patient tolerated treatment well   Patient Left in chair;with call bell/phone within reach   Nurse Communication Mobility status        Time: 2081-3887 OT Time Calculation  (min): 25 min  Charges: OT General Charges $OT Visit: 1 Procedure OT Treatments $Self Care/Home Management : 23-37 mins  [place], OTR/L Acute Rehab Pager: [phone] Office: [place] 04/12/2017, 8:58 AM

## 2017-04-12 NOTE — Discharge Summary (Signed)
Physician Discharge Summary  Patient ID: [name] MRN: [id number] DOB/AGE: 69-30-1947 69 y.o.  Admit date: 04/11/2017 Discharge date: 04/12/2017  Admission Diagnoses:  Displaced comminuted fracture of left patella, initial encounter for closed fracture  Discharge Diagnoses:  Principal Problem:   Displaced comminuted fracture of left patella, initial encounter for closed fracture Active Problems:   Left patella fracture   Past Medical History:  Diagnosis Date  . Arthritis    OA  . Atrial flutter ([place])    a. s/p TEE/DCCV 08/16/13 (normal LV function, no LAA thrombus).b. s/p ablation by Dr Lovena Le 09-23-2013  . Coronary artery disease    2009 LAD Promus stent  . Dysrhythmia    Aflutter, no issues after ablation 2014  . Fibroid   . Hyperlipidemia   . Insulin pump in place   . Osteopenia   . Type 1 diabetes mellitus on insulin therapy ([place])   . Valvular heart disease    a. Mild MR/TR by TEE 08/2013.    Surgeries: Procedure(s): OPEN REDUCTION INTERNAL (ORIF) FIXATION PATELLA LEFT KNEE APPLICATION OF WOUND VAC LEFT KNEE on 04/11/2017   Consultants (if any):   Discharged Condition: Improved  Hospital Course: [name] is an 71 y.o. female who was admitted 04/11/2017 with a diagnosis of Displaced comminuted fracture of left patella, initial encounter for closed fracture and went to the operating room on 04/11/2017 and underwent the above named procedures.    She was given perioperative antibiotics:  Anti-infectives    Start     Dose/Rate Route Frequency Ordered Stop   04/11/17 1300  ceFAZolin (ANCEF) IVPB 2g/100 mL premix     2 g 200 mL/hr over 30 Minutes Intravenous Every 6 hours 04/11/17 1043 04/12/17 0113   04/11/17 0616  ceFAZolin (ANCEF) IVPB 2g/100 mL premix     2 g 200 mL/hr over 30 Minutes Intravenous On call to O.R. 04/11/17 7939 04/11/17 0746    .  She was given sequential compression devices, early ambulation, and ASA / plavix for DVT prophylaxis.  She  benefited maximally from the hospital stay and there were no complications.    Recent vital signs:  Vitals:   04/12/17 0050 04/12/17 0347  BP: (!) 124/54 (!) 123/54  Pulse: 69 68  Resp: 18 18  Temp: 98.2 F (36.8 C) 98.4 F (36.9 C)    Recent laboratory studies:  Lab Results  Component Value Date   HGB 12.8 04/11/2017   HGB 13.9 03/27/2017   HGB 15.1 (H) 08/13/2016   Lab Results  Component Value Date   WBC 5.5 04/11/2017   PLT 321 04/11/2017   Lab Results  Component Value Date   INR 1.11 09/23/2013   Lab Results  Component Value Date   NA 138 04/11/2017   K 3.9 04/11/2017   CL 103 04/11/2017   CO2 29 04/11/2017   BUN 8 04/11/2017   CREATININE 0.75 04/11/2017   GLUCOSE 180 (H) 04/11/2017    Discharge Medications:   Allergies as of 04/12/2017      Reactions   Sulfa Antibiotics Rash      Medication List    STOP taking these medications   aspirin 325 MG tablet Replaced by:  aspirin 81 MG chewable tablet   ibuprofen 200 MG tablet Commonly known as:  ADVIL,MOTRIN   traMADol 50 MG tablet Commonly known as:  ULTRAM     TAKE these medications   aspirin 81 MG chewable tablet Chew 1 tablet (81 mg total) by mouth daily  at 6 PM. Replaces:  aspirin 325 MG tablet   CALCIUM + D PO Take 600 mg by mouth 2 (two) times daily.   cholecalciferol 1000 units tablet Commonly known as:  VITAMIN D Take 1,000 Units by mouth 2 (two) times daily.   clopidogrel 75 MG tablet Commonly known as:  PLAVIX Take 1 tablet (75 mg total) by mouth daily.   glucose blood test strip Commonly known as:  BAYER CONTOUR TEST Use as instructed to check sugar 6 times daily. Dx Code E10.65   HYDROcodone-acetaminophen 5-325 MG tablet Commonly known as:  NORCO/VICODIN Take 1-2 tablets by mouth every 4 (four) hours as needed (breakthrough pain).   insulin aspart 100 UNIT/ML injection Commonly known as:  NOVOLOG Use Max 200 units per day in insulin pump What changed:  additional  instructions   insulin pump Soln Inject into the skin. Novolog Vial, max 200 units per day with pump   metFORMIN 500 MG 24 hr tablet Commonly known as:  GLUCOPHAGE-XR Take 2 tablets by mouth  daily   metoprolol succinate 100 MG 24 hr tablet Commonly known as:  TOPROL-XL TAKE 1 TABLET (100 MG TOTAL) BY MOUTH DAILY.   niacin 1000 MG CR tablet Commonly known as:  NIASPAN Take 1 tablet by mouth at  bedtime What changed:  how much to take  how to take this  when to take this  additional instructions   nitroGLYCERIN 0.4 MG SL tablet Commonly known as:  NITROSTAT Place 1 tablet (0.4 mg total) under the tongue every 5 (five) minutes as needed for chest pain.   ondansetron 4 MG tablet Commonly known as:  ZOFRAN Take 1 tablet (4 mg total) by mouth every 6 (six) hours as needed for nausea.   pioglitazone 15 MG tablet Commonly known as:  ACTOS Take 1 tablet (15 mg total) by mouth daily.   rosuvastatin 40 MG tablet Commonly known as:  CRESTOR Take 1 tablet (40 mg total) by mouth daily.   Vitamin D (Ergocalciferol) 50000 units Caps capsule Commonly known as:  DRISDOL Take 1 capsule (50,000 Units total) by mouth every 7 (seven) days. What changed:  when to take this       Diagnostic Studies: Dg Knee 1-2 Views Left  Result Date: 04/11/2017 CLINICAL DATA:  71 year old female status post fixation of acute patellar fracture. EXAM: DG C-ARM 61-120 MIN; LEFT KNEE - 1-2 VIEW COMPARISON:  Preoperative radiographs 03/22/2017 FINDINGS: Interval surgical repair of the patellar fracture with 2 cannulated lag screws. The fracture fragments appear well approximated. No additional fracture or abnormality identified. IMPRESSION: ORIF of patellar fracture without evidence of immediate hardware complication. Electronically Signed   By: Jacqulynn Cadet M.D.   On: 04/11/2017 09:05   Ct Knee Left Wo Contrast  Result Date: 03/26/2017 CLINICAL DATA:  Patella fracture. EXAM: CT OF THE LEFT KNEE  WITHOUT CONTRAST TECHNIQUE: Multidetector CT imaging of the left knee was performed according to the standard protocol. Multiplanar CT image reconstructions were also generated. COMPARISON:  Radiographs dated 03/22/2017 FINDINGS: Bones/Joint/Cartilage There is a comminuted fracture of patella with a transverse and oblique and vertical components to the fracture. There is a lateral fragment which is rotated and displaced. The underlying femur and proximal tibia and fibula are intact. There is only a small joint effusion. Cruciate ligaments are intact. Collateral ligaments are intact. There is a small Baker's cyst. IMPRESSION: Comminuted fracture of the patella as described. Electronically Signed   By: Lorriane Shire M.D.   On: 03/26/2017  14:15   Dg Knee Complete 4 Views Left  Result Date: 03/22/2017 CLINICAL DATA:  Pt states that she fell on her carport after slipping up on the wet floor and landed on her left knee. Pt c/o pain in left knee as well as swelling, bruising and lots of discoloration anteriorly. No hx of injuries. EXAM: LEFT KNEE - COMPLETE 4+ VIEW COMPARISON:  None. FINDINGS: There is a comminuted fracture of the patella. Primary fracture is transverse across the mid patella, with the major fracture components displaced 11 mm. Another transverse nondisplaced fracture is noted along the lower pole. No other fractures.  The knee joint is normally aligned. Small to moderate joint effusion.  Anterior soft tissue swelling. IMPRESSION: 1. Mildly comminuted and displaced fracture of the patella. No dislocation. Electronically Signed   By: Lajean Manes M.D.   On: 03/22/2017 14:50   Dg Knee Left Port  Result Date: 04/11/2017 CLINICAL DATA:  Postop exam, patella fracture EXAM: PORTABLE LEFT KNEE - 1-2 VIEW COMPARISON:  04/11/2017 FINDINGS: 2 fixation screws reduce the patella fracture anteriorly. Postop changes of the soft tissues. Anterior staples noted. Normal alignment. No large effusion. IMPRESSION:  Status post ORIF of the left patella fracture. Expected postoperative findings. Electronically Signed   By: Jerilynn Mages.  Shick M.D.   On: 04/11/2017 10:23   Dg C-arm 1-60 Min  Result Date: 04/11/2017 CLINICAL DATA:  72 year old female status post fixation of acute patellar fracture. EXAM: DG C-ARM 61-120 MIN; LEFT KNEE - 1-2 VIEW COMPARISON:  Preoperative radiographs 03/22/2017 FINDINGS: Interval surgical repair of the patellar fracture with 2 cannulated lag screws. The fracture fragments appear well approximated. No additional fracture or abnormality identified. IMPRESSION: ORIF of patellar fracture without evidence of immediate hardware complication. Electronically Signed   By: Jacqulynn Cadet M.D.   On: 04/11/2017 09:05    Disposition: 01-Home or Self Care  Discharge Instructions    Call MD / Call 911    Complete by:  As directed    If you experience chest pain or shortness of breath, CALL 911 and be transported to the hospital emergency room.  If you develope a fever above 101 F, pus (white drainage) or increased drainage or redness at the wound, or calf pain, call your surgeon's office.   Constipation Prevention    Complete by:  As directed    Drink plenty of fluids.  Prune juice may be helpful.  You may use a stool softener, such as Colace (over the counter) 100 mg twice a day.  Use MiraLax (over the counter) for constipation as needed.   Diet - low sodium heart healthy    Complete by:  As directed    Discharge instructions    Complete by:  As directed    Weight bearing as tolerated with walker Wear knee immobilizer at all times Do NOT bend your knee Keep VAC dressing clean and dry   Driving restrictions    Complete by:  As directed    No driving for 12 weeks   Increase activity slowly as tolerated    Complete by:  As directed    Lifting restrictions    Complete by:  As directed    No lifting for 12 weeks      Follow-up Information    Joye Wesenberg, Aaron Edelman, MD. Schedule an appointment as  soon as possible for a visit on 04/18/2017.   Specialty:  Orthopedic Surgery Why:  For wound re-check Contact information: [address] [place] [address] [phone]  Signed: Elie Goody 04/12/2017, 12:17 PM

## 2017-04-12 NOTE — Progress Notes (Signed)
Pt ready for d/c home per MD. Discharge instructions and prescriptions reviewed with pt and husband, all questions answered. Hospital wound vac switched to prevena prior to d/c. Pt's RN left message with Primus Bravo, CM about setting up hh. Pt did not want to wait to hear back from CM. Pt has 3N1 at home, and a rolling walker to borrow from a friend.

## 2017-04-14 ENCOUNTER — Encounter (HOSPITAL_COMMUNITY): Payer: Self-pay | Admitting: Orthopedic Surgery

## 2017-04-16 DIAGNOSIS — I2510 Atherosclerotic heart disease of native coronary artery without angina pectoris: Secondary | ICD-10-CM | POA: Diagnosis not present

## 2017-04-16 DIAGNOSIS — M8580 Other specified disorders of bone density and structure, unspecified site: Secondary | ICD-10-CM | POA: Diagnosis not present

## 2017-04-16 DIAGNOSIS — I4892 Unspecified atrial flutter: Secondary | ICD-10-CM | POA: Diagnosis not present

## 2017-04-16 DIAGNOSIS — S82002D Unspecified fracture of left patella, subsequent encounter for closed fracture with routine healing: Secondary | ICD-10-CM | POA: Diagnosis not present

## 2017-04-16 DIAGNOSIS — E109 Type 1 diabetes mellitus without complications: Secondary | ICD-10-CM | POA: Diagnosis not present

## 2017-04-16 DIAGNOSIS — M1990 Unspecified osteoarthritis, unspecified site: Secondary | ICD-10-CM | POA: Diagnosis not present

## 2017-04-17 DIAGNOSIS — M1990 Unspecified osteoarthritis, unspecified site: Secondary | ICD-10-CM | POA: Diagnosis not present

## 2017-04-17 DIAGNOSIS — E109 Type 1 diabetes mellitus without complications: Secondary | ICD-10-CM | POA: Diagnosis not present

## 2017-04-17 DIAGNOSIS — M8580 Other specified disorders of bone density and structure, unspecified site: Secondary | ICD-10-CM | POA: Diagnosis not present

## 2017-04-17 DIAGNOSIS — S82002D Unspecified fracture of left patella, subsequent encounter for closed fracture with routine healing: Secondary | ICD-10-CM | POA: Diagnosis not present

## 2017-04-17 DIAGNOSIS — I4892 Unspecified atrial flutter: Secondary | ICD-10-CM | POA: Diagnosis not present

## 2017-04-17 DIAGNOSIS — I2510 Atherosclerotic heart disease of native coronary artery without angina pectoris: Secondary | ICD-10-CM | POA: Diagnosis not present

## 2017-04-18 DIAGNOSIS — I2510 Atherosclerotic heart disease of native coronary artery without angina pectoris: Secondary | ICD-10-CM | POA: Diagnosis not present

## 2017-04-18 DIAGNOSIS — E109 Type 1 diabetes mellitus without complications: Secondary | ICD-10-CM | POA: Diagnosis not present

## 2017-04-18 DIAGNOSIS — M1990 Unspecified osteoarthritis, unspecified site: Secondary | ICD-10-CM | POA: Diagnosis not present

## 2017-04-18 DIAGNOSIS — S82002D Unspecified fracture of left patella, subsequent encounter for closed fracture with routine healing: Secondary | ICD-10-CM | POA: Diagnosis not present

## 2017-04-18 DIAGNOSIS — I4892 Unspecified atrial flutter: Secondary | ICD-10-CM | POA: Diagnosis not present

## 2017-04-18 DIAGNOSIS — S82042D Displaced comminuted fracture of left patella, subsequent encounter for closed fracture with routine healing: Secondary | ICD-10-CM | POA: Diagnosis not present

## 2017-04-18 DIAGNOSIS — M8580 Other specified disorders of bone density and structure, unspecified site: Secondary | ICD-10-CM | POA: Diagnosis not present

## 2017-04-21 DIAGNOSIS — M1990 Unspecified osteoarthritis, unspecified site: Secondary | ICD-10-CM | POA: Diagnosis not present

## 2017-04-21 DIAGNOSIS — M8580 Other specified disorders of bone density and structure, unspecified site: Secondary | ICD-10-CM | POA: Diagnosis not present

## 2017-04-21 DIAGNOSIS — I4892 Unspecified atrial flutter: Secondary | ICD-10-CM | POA: Diagnosis not present

## 2017-04-21 DIAGNOSIS — S82002D Unspecified fracture of left patella, subsequent encounter for closed fracture with routine healing: Secondary | ICD-10-CM | POA: Diagnosis not present

## 2017-04-21 DIAGNOSIS — I2510 Atherosclerotic heart disease of native coronary artery without angina pectoris: Secondary | ICD-10-CM | POA: Diagnosis not present

## 2017-04-21 DIAGNOSIS — E109 Type 1 diabetes mellitus without complications: Secondary | ICD-10-CM | POA: Diagnosis not present

## 2017-04-24 DIAGNOSIS — S82042D Displaced comminuted fracture of left patella, subsequent encounter for closed fracture with routine healing: Secondary | ICD-10-CM | POA: Diagnosis not present

## 2017-04-24 DIAGNOSIS — I2510 Atherosclerotic heart disease of native coronary artery without angina pectoris: Secondary | ICD-10-CM | POA: Diagnosis not present

## 2017-04-24 DIAGNOSIS — I4892 Unspecified atrial flutter: Secondary | ICD-10-CM | POA: Diagnosis not present

## 2017-04-24 DIAGNOSIS — S82002D Unspecified fracture of left patella, subsequent encounter for closed fracture with routine healing: Secondary | ICD-10-CM | POA: Diagnosis not present

## 2017-04-24 DIAGNOSIS — Z4789 Encounter for other orthopedic aftercare: Secondary | ICD-10-CM | POA: Diagnosis not present

## 2017-04-24 DIAGNOSIS — M1990 Unspecified osteoarthritis, unspecified site: Secondary | ICD-10-CM | POA: Diagnosis not present

## 2017-04-24 DIAGNOSIS — E109 Type 1 diabetes mellitus without complications: Secondary | ICD-10-CM | POA: Diagnosis not present

## 2017-04-24 DIAGNOSIS — M8580 Other specified disorders of bone density and structure, unspecified site: Secondary | ICD-10-CM | POA: Diagnosis not present

## 2017-04-28 ENCOUNTER — Ambulatory Visit: Payer: Medicare Other | Admitting: Family Medicine

## 2017-04-30 DIAGNOSIS — M25662 Stiffness of left knee, not elsewhere classified: Secondary | ICD-10-CM | POA: Diagnosis not present

## 2017-05-12 ENCOUNTER — Other Ambulatory Visit (INDEPENDENT_AMBULATORY_CARE_PROVIDER_SITE_OTHER): Payer: Medicare Other

## 2017-05-12 DIAGNOSIS — E1065 Type 1 diabetes mellitus with hyperglycemia: Secondary | ICD-10-CM | POA: Diagnosis not present

## 2017-05-12 DIAGNOSIS — M25662 Stiffness of left knee, not elsewhere classified: Secondary | ICD-10-CM | POA: Diagnosis not present

## 2017-05-12 LAB — GLUCOSE, RANDOM: Glucose, Bld: 84 mg/dL (ref 70–99)

## 2017-05-12 LAB — HEMOGLOBIN A1C: HEMOGLOBIN A1C: 7.1 % — AB (ref 4.6–6.5)

## 2017-05-14 NOTE — Progress Notes (Signed)
Patient ID: [name], female   DOB: 03/21/1946, 71 y.o.   MRN: [id number]   Reason for visit: DIABETES followup.  Diagnosis: Type 1 diabetes mellitus, date of diagnosis: 1979.    HISTORY of present illness:   Insulin Pump: CURRENT brand: 630  The pump settings are: Midnight = 0.9,  2 AM  0.8, 5 AM = 1.9, 8 AM = 1.7, 11 AM = 1.0; 5 PM = 2.1, 7 PM = 2.6 and 10 PM = 1.7.   Total daily insulin = 44.5 units, 80% basal   Carbohydrate ratio 1:15 breakfast, lunch and 1:7 at dinner; sensitivity 1: 35, 30 after 6 AM; target  110-30  Insulin on board indicator on, duration 4 hrs   DIABETES: She has had long-standing diabetes with A1c levels usually above target. In 10/13 ACTOS was restarted because of her large insulin requirement and diabetic dyslipidemia.  Her blood sugars improved significantly reduced.  Also has been taking metformin as insulin sensitizer  RECENT history:  Her A1c is now 7.1 and has been improving, has been as high as 8.3  Current blood sugar patterns from pump and Sensor download and interpretation of her CGM download:   Her Sensor has been active for about  84 of the time  AVERAGE blood sugar with her Sensor is 184, previously 172 with standard deviation 81  Her blood sugar patterns have been extremely variable over the last 2 weeks  She had difficulties with hyperglycemia when she was on medication late June until beginning of this week with blood sugars being markedly increased after her meals when she was eating out and also one episode of infusion set malfunction  Although she was having higher readings after supper she is not showing general decline in blood sugars late at night  OVERNIGHT blood sugars have been quite variable with high blood sugars when she was eating out but more recently blood sugars have been relatively low after about 11 AM until 1 AM; also 2 nights ago her blood sugars are persistently low between 12 AM-3 AM  She does not  appear to have as much of a dawn phenomenon the last few days as she had previously  POSTPRANDIAL readings recently have been fairly good in the last few days after dinner except sporadic high readings after breakfast  Her coverage for evening meal was increased with the carb ratio 1:8 on the last visit  She also is having some tendency to HYPOGLYCEMIA mid day specially when she is late for meal this week, lowest blood sugar 51  PUMP management: She is trying to enter her carbohydrates for meals more often when bolusing  Blood sugar data from fingersticks and CGM:  She has 50.3% readings within target, 44% high with 20% over 250 and 2.3% very low  PRE-MEAL Fasting Lunch Dinner Bedtime Overall  Glucose range:  99-3 02       Mean/median:  283  139  178  193     POST-MEAL PC Breakfast PC Lunch PC Dinner  Glucose range:     Mean/median:   223     Hypoglycemic awareness: Has symptoms of feeling tired, headache, sweaty. Variable recognition threshold present  Sometimes may not be aware during the night and will be notified by her sensor that the sugar is low  She has symptoms when blood glucose is less than 60. Uses glucose tablets for treatment when not at home but otherwise eats a snack .    DIET  has been usually fairly controlled with carbohydrate and fat intake, tends to eat out on weekends Again her mealtimes are variable.  Usually trying to have low fat meals  Food preferences: Yogurt And cottage cheese for breakfast usually.   Physical activity: exercise: None recently Historically after exercise blood sugar will get lower in 1-2 hrs but sometimes it may get lower while walking and she carries glucose tablets with her. .    Wt Readings from Last 3 Encounters:  05/15/17 197 lb 6.4 oz (89.5 kg)  04/11/17 203 lb (92.1 kg)  03/27/17 203 lb (92.1 kg)    LABS:  Lab Results  Component Value Date   HGBA1C 7.1 (H) 05/12/2017   HGBA1C 7.5 (H) 04/11/2017   HGBA1C 8.3 (H)  12/20/2016   Lab Results  Component Value Date   MICROALBUR <0.7 02/04/2017   LDLCALC 69 02/04/2017   CREATININE 0.75 04/11/2017    Lab on 05/12/2017  Component Date Value Ref Range Status  . Hgb A1c MFr Bld 05/12/2017 7.1* 4.6 - 6.5 % Final   Glycemic Control Guidelines for People with Diabetes:Non Diabetic:  <6%Goal of Therapy: <7%Additional Action Suggested:  >8%   . Glucose, Bld 05/12/2017 84  70 - 99 mg/dL Final    Allergies as of 05/15/2017      Reactions   Sulfa Antibiotics Rash      Medication List       Accurate as of 05/15/17 11:39 AM. Always use your most recent med list.          aspirin 81 MG chewable tablet Chew 1 tablet (81 mg total) by mouth daily at 6 PM.   CALCIUM + D PO Take 600 mg by mouth 2 (two) times daily.   cholecalciferol 1000 units tablet Commonly known as:  VITAMIN D Take 1,000 Units by mouth 2 (two) times daily.   clopidogrel 75 MG tablet Commonly known as:  PLAVIX Take 1 tablet (75 mg total) by mouth daily.   glucose blood test strip Commonly known as:  BAYER CONTOUR TEST Use as instructed to check sugar 6 times daily. Dx Code E10.65   HYDROcodone-acetaminophen 5-325 MG tablet Commonly known as:  NORCO/VICODIN Take 1-2 tablets by mouth every 4 (four) hours as needed (breakthrough pain).   insulin aspart 100 UNIT/ML injection Commonly known as:  NOVOLOG Use Max 200 units per day in insulin pump   insulin pump Soln Inject into the skin. Novolog Vial, max 200 units per day with pump   metFORMIN 500 MG 24 hr tablet Commonly known as:  GLUCOPHAGE-XR Take 2 tablets by mouth  daily   metoprolol succinate 100 MG 24 hr tablet Commonly known as:  TOPROL-XL TAKE 1 TABLET (100 MG TOTAL) BY MOUTH DAILY.   niacin 1000 MG CR tablet Commonly known as:  NIASPAN Take 1 tablet by mouth at  bedtime   nitroGLYCERIN 0.4 MG SL tablet Commonly known as:  NITROSTAT Place 1 tablet (0.4 mg total) under the tongue every 5 (five) minutes as  needed for chest pain.   pioglitazone 15 MG tablet Commonly known as:  ACTOS Take 1 tablet (15 mg total) by mouth daily.   rosuvastatin 40 MG tablet Commonly known as:  CRESTOR Take 1 tablet (40 mg total) by mouth daily.   Vitamin D (Ergocalciferol) 50000 units Caps capsule Commonly known as:  DRISDOL Take 1 capsule (50,000 Units total) by mouth every 7 (seven) days.       Allergies:  Allergies  Allergen Reactions  .  Sulfa Antibiotics Rash    Past Medical History:  Diagnosis Date  . Arthritis    OA  . Atrial flutter ([place])    a. s/p TEE/DCCV 08/16/13 (normal LV function, no LAA thrombus).b. s/p ablation by Dr Lovena Le 09-23-2013  . Coronary artery disease    2009 LAD Promus stent  . Dysrhythmia    Aflutter, no issues after ablation 2014  . Fibroid   . Hyperlipidemia   . Insulin pump in place   . Osteopenia   . Type 1 diabetes mellitus on insulin therapy ([place])   . Valvular heart disease    a. Mild MR/TR by TEE 08/2013.    Past Surgical History:  Procedure Laterality Date  . ABLATION  09-23-2013   CTI by Dr Lovena Le  . APPENDECTOMY    . APPLICATION OF WOUND VAC Left 04/11/2017   Procedure: APPLICATION OF WOUND VAC LEFT KNEE;  Surgeon: Rod Can, MD;  Location: [facility];  Service: Orthopedics;  Laterality: Left;  . ATRIAL FLUTTER ABLATION N/A 09/23/2013   Procedure: ATRIAL FLUTTER ABLATION;  Surgeon: Evans Lance, MD;  Location[facility] CATH LAB;  Service: Cardiovascular;  Laterality: N/A;  . CARDIOVERSION N/A 08/16/2013   Procedure: CARDIOVERSION;  Surgeon: Thayer Headings, MD;  Location: [place];  Service: Cardiovascular;  Laterality: N/A;  spoke with [name]  . CATARACT EXTRACTION Bilateral   . COLONOSCOPY    . CORONARY ANGIOPLASTY WITH STENT PLACEMENT    . MYOMECTOMY  1977  . ORIF PATELLA Left 04/11/2017  . ORIF PATELLA Left 04/11/2017   Procedure: OPEN REDUCTION INTERNAL (ORIF) FIXATION PATELLA LEFT KNEE;  Surgeon: Rod Can, MD;  Location: [facility];  Service:  Orthopedics;  Laterality: Left;  . PELVIC LAPAROSCOPY    . TEE WITHOUT CARDIOVERSION N/A 08/16/2013   Procedure: TRANSESOPHAGEAL ECHOCARDIOGRAM (TEE);  Surgeon: Thayer Headings, MD;  Location: [facility] ENDOSCOPY;  Service: Cardiovascular;  Laterality: N/A;    Family History  Problem Relation Age of Onset  . CAD Father 69       died age 25  . CAD Brother 65       small vessel disease  . Atrial fibrillation Mother   . Diabetes Mother   . Hypertension Mother   . CAD Cousin        paternal  . CAD Paternal Grandfather        early onset  . Colon cancer Neg Hx     Social History:  reports that she has never smoked. She has never used smokeless tobacco. She reports that she drinks about 3.0 oz of alcohol per week . She reports that she does not use drugs.  REVIEW of systems:   She has had diabetic dyslipidemia with significantly high LDL particle number and this has been managed with Crestor 40 mg and Niaspan 1000 mg In 3/16 her particle number was below 1000 and Her last level was excellent at 856  Her last LDL is below 70, HDL excellent However for the last several weeks she has not taken niacin since she was told by other doctors not to take more than 81 mg aspirin; she will have flushing/itching unless she is taking 325 mg aspirin with her niacin   Lab Results  Component Value Date   CHOL 137 02/04/2017   HDL 55.80 02/04/2017   LDLCALC 69 02/04/2017   LDLDIRECT 79.0 01/11/2015   TRIG 60.0 02/04/2017   CHOLHDL 2 02/04/2017   She has had atrial arrhythmias treated with metoprolol  BP 130/74   Pulse 68   Ht 5' 9.5" (1.765 m)   Wt 197 lb 6.4 oz (89.5 kg)   SpO2 98%   BMI 28.73 kg/m    ASSESSMENT/PLAN:  DIABETES type 1 with fair control, A1c is good at 7.1 % and improving now  See history of present illness for detailed discussion of her current blood sugar patterns and management with her pump and Review of her home continuous glucose monitoring   She has no  consistent patterns She was having poor control [place] and [place] frequently More recently blood sugars are trending lower including some tendency to hypoglycemia now including overnight Although she was having rise in blood sugar early morning this does not appear to be occurring the last few days Lowest blood sugars are either around midnight or 1-3 PM Postprandial readings are again variable depending on her diet She is not entering extra insulin for higher fat meals or extending boluses when eating out  Recommendations:  Needs to add 30-50% more insulin for higher fat meals and extended boluses  No change in carbohydrate coverage but may consider increasing boluses at breakfast if blood sugars are more consistently high or  Basal rate changes: Midnight = 0.75, 11 AM-3 PM = 0.9, 3 PM-5 PM = 1.0, 10 PM = 1.5  She will reduce her basal rate at midnight down to 0.8, at 5 PM down to 2.0 and at 10 PM down to 1.6  Call if having nocturnal hypoglycemia  She will try to eat balanced meals with low fat content and try to be on time as much is possible especially at lunchtime  HYPERLIPIDEMIA: She needs to resume her niacin with full dose aspirin Discussed that she can take 325 mg aspirin which she did not have any different effects for her compared to 81 mg Will repeat Lipids on the next visit  Counseling time on subjects discussed in assessment and plan sections is over 50% of today's 25 minute visit   Chadwick Reiswig

## 2017-05-15 ENCOUNTER — Ambulatory Visit (INDEPENDENT_AMBULATORY_CARE_PROVIDER_SITE_OTHER): Payer: Medicare Other | Admitting: Endocrinology

## 2017-05-15 ENCOUNTER — Other Ambulatory Visit: Payer: Self-pay

## 2017-05-15 ENCOUNTER — Ambulatory Visit: Payer: Medicare Other | Admitting: Endocrinology

## 2017-05-15 ENCOUNTER — Encounter: Payer: Self-pay | Admitting: Endocrinology

## 2017-05-15 VITALS — BP 130/74 | HR 68 | Ht 69.5 in | Wt 197.4 lb

## 2017-05-15 DIAGNOSIS — E782 Mixed hyperlipidemia: Secondary | ICD-10-CM | POA: Diagnosis not present

## 2017-05-15 DIAGNOSIS — E1065 Type 1 diabetes mellitus with hyperglycemia: Secondary | ICD-10-CM | POA: Diagnosis not present

## 2017-05-15 MED ORDER — CLOPIDOGREL BISULFATE 75 MG PO TABS: 75.0000 mg | ORAL_TABLET | Freq: Every day | ORAL | 0 refills | Status: DC

## 2017-05-15 MED ORDER — ROSUVASTATIN CALCIUM 40 MG PO TABS: 40.0000 mg | ORAL_TABLET | Freq: Every day | ORAL | 1 refills | Status: DC

## 2017-05-15 MED ORDER — METFORMIN HCL ER 500 MG PO TB24: ORAL_TABLET | ORAL | 1 refills | Status: DC

## 2017-05-16 DIAGNOSIS — M25662 Stiffness of left knee, not elsewhere classified: Secondary | ICD-10-CM | POA: Diagnosis not present

## 2017-05-19 DIAGNOSIS — M25662 Stiffness of left knee, not elsewhere classified: Secondary | ICD-10-CM | POA: Diagnosis not present

## 2017-05-21 DIAGNOSIS — M25662 Stiffness of left knee, not elsewhere classified: Secondary | ICD-10-CM | POA: Diagnosis not present

## 2017-05-23 DIAGNOSIS — S82042D Displaced comminuted fracture of left patella, subsequent encounter for closed fracture with routine healing: Secondary | ICD-10-CM | POA: Diagnosis not present

## 2017-06-10 IMAGING — CT CT KNEE*L* W/O CM
3 series · 15 of 33 positions shown, 18 images · non-contrast
Comparison: Radiographs dated 03/22/2017

CLINICAL DATA: Patella fracture.

EXAM:
CT OF THE LEFT KNEE WITHOUT CONTRAST
TECHNIQUE: Multidetector CT imaging of the left knee was performed according to
the standard protocol. Multiplanar CT image reconstructions were
also generated.

[Series 4: knee soft tissue · axial · 0.48mm/px · z∈[-945,-789]mm · 7 of 62 slices shown, 9 images]
[im 5/62  soft-tissue]
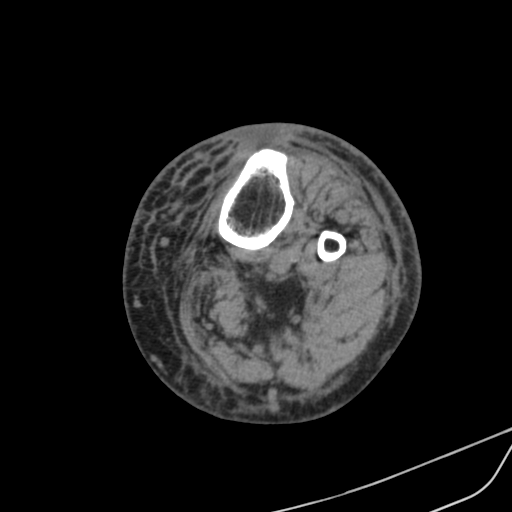
[im 5/62  bone]
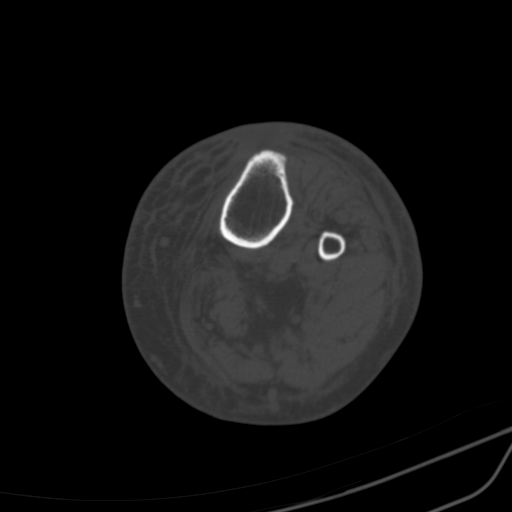
[im 15/62  bone]
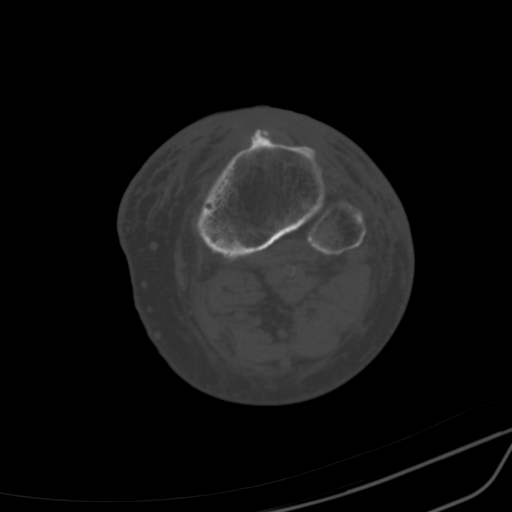
[im 24/62  bone]
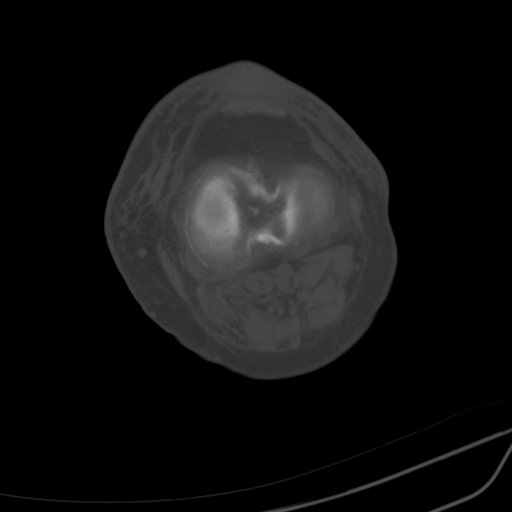
[im 33/62  bone]
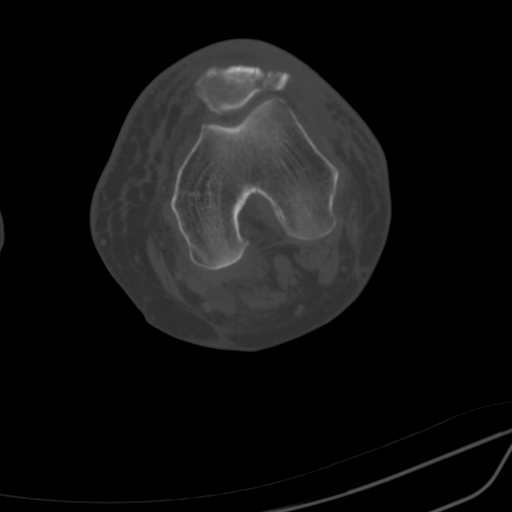
[im 38/62  soft-tissue]
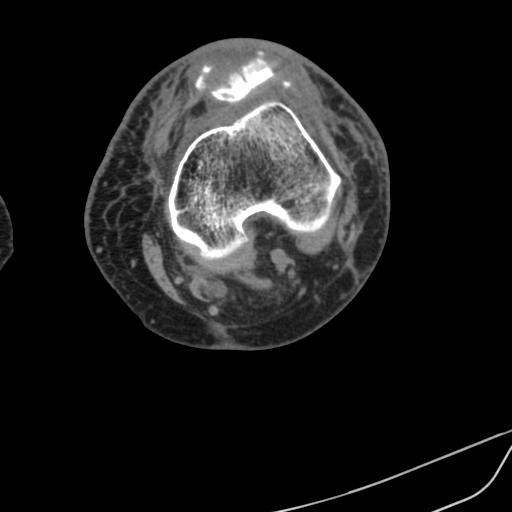
[im 38/62  bone]
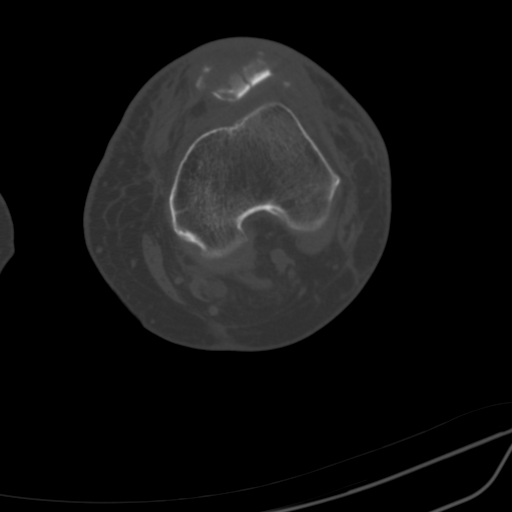
[im 47/62  bone]
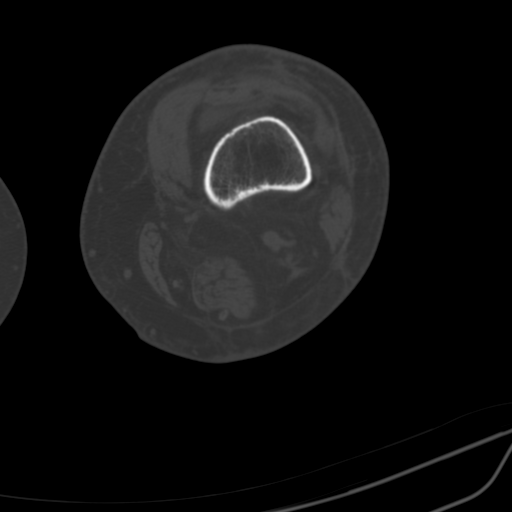
[im 57/62  bone]
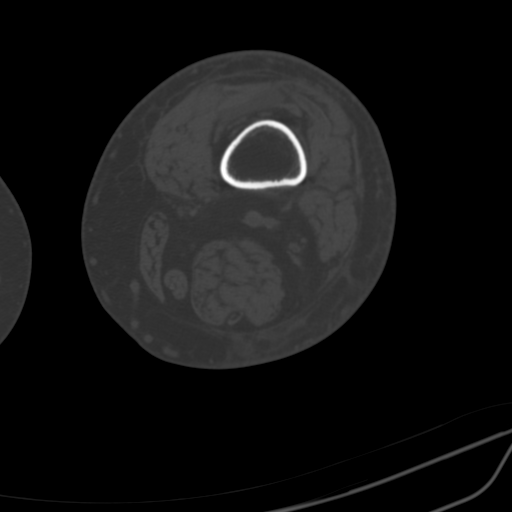

[Series 9: cor soft · coronal · 0.28mm/px · 3 of 52 slices shown]
[im 11/52  bone]
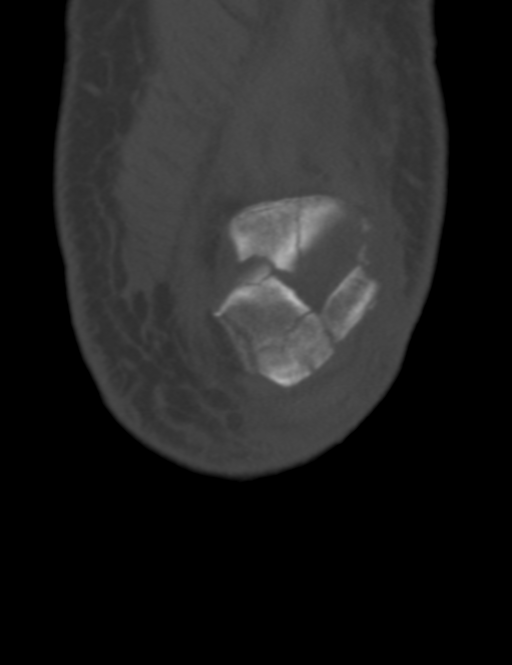
[im 21/52  bone]
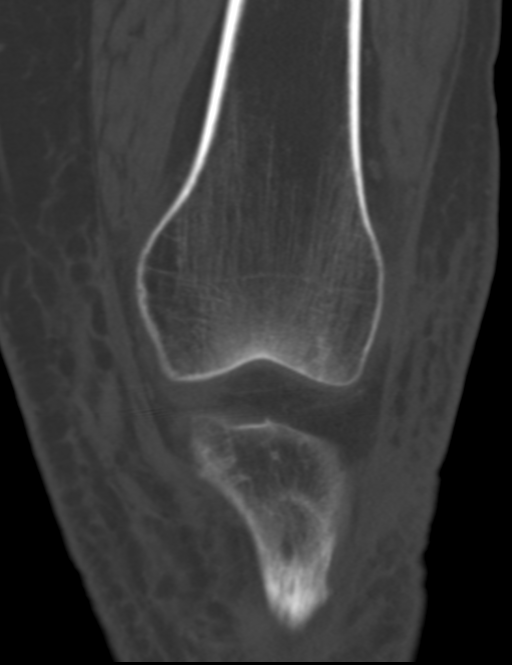
[im 31/52  bone]
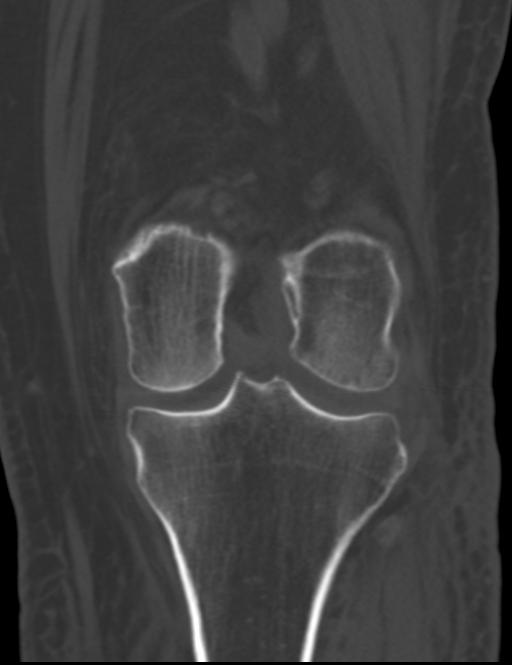

[Series 10: sag soft · sagittal · 0.34mm/px · 5 of 50 slices shown, 6 images]
[im 17/50  bone]
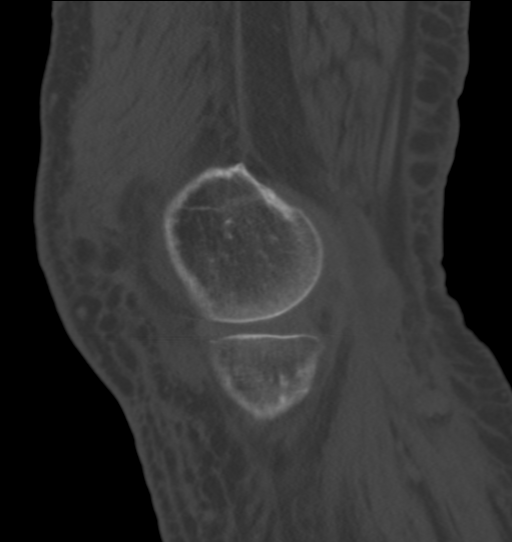
[im 21/50  bone]
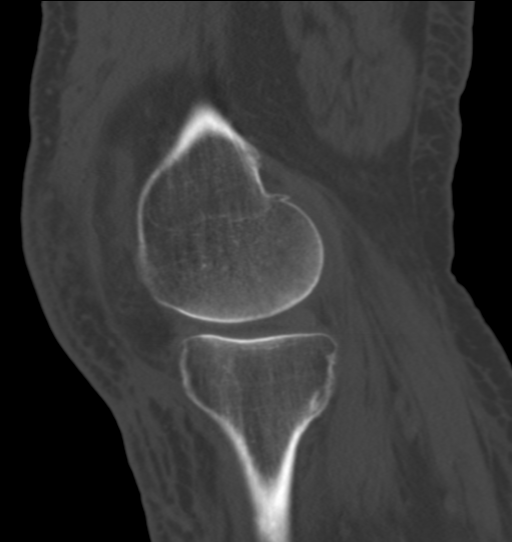
[im 25/50  soft-tissue]
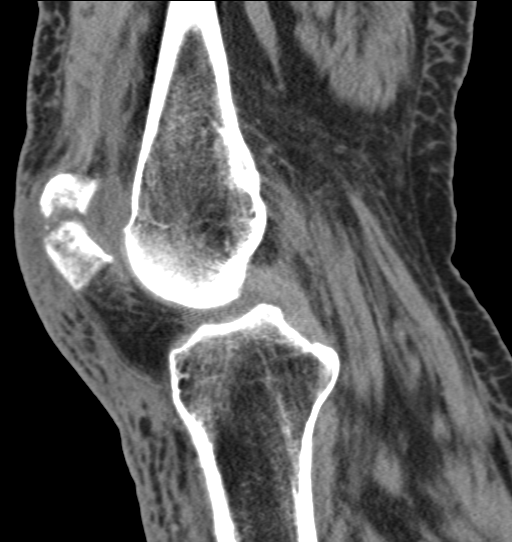
[im 25/50  bone]
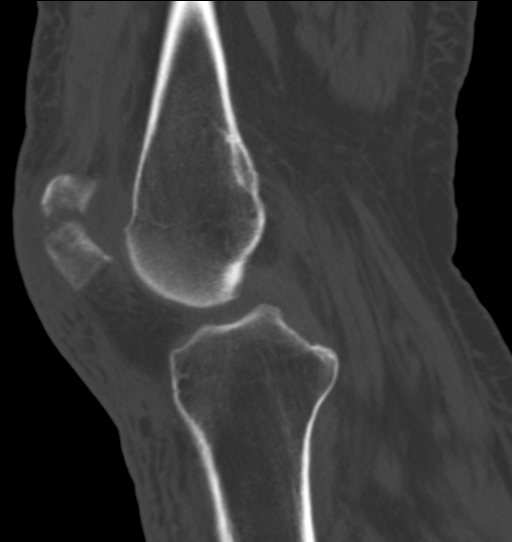
[im 29/50  bone]
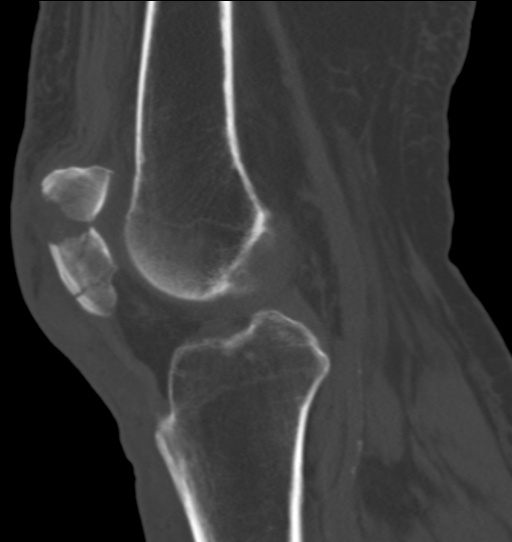
[im 33/50  bone]
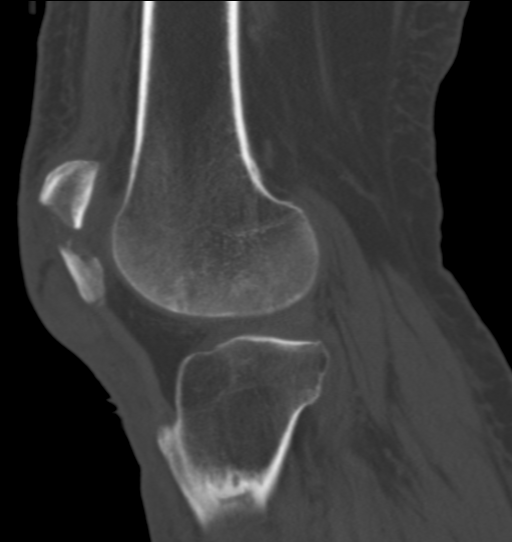

[15 of 33 positions shown; findings below may reference images not displayed]

FINDINGS: Bones/Joint/Cartilage

There is a comminuted fracture of patella with a transverse and
oblique and vertical components to the fracture. There is a lateral
fragment which is rotated and displaced. The underlying femur and
proximal tibia and fibula are intact. There is only a small joint
effusion.

Cruciate ligaments are intact. Collateral ligaments are intact.
There is a small Baker's cyst.
IMPRESSION: Comminuted fracture of the patella as described.

## 2017-06-17 DIAGNOSIS — L03115 Cellulitis of right lower limb: Secondary | ICD-10-CM | POA: Diagnosis not present

## 2017-06-25 DIAGNOSIS — E119 Type 2 diabetes mellitus without complications: Secondary | ICD-10-CM | POA: Diagnosis not present

## 2017-06-25 LAB — HM DIABETES EYE EXAM

## 2017-07-04 DIAGNOSIS — S82042D Displaced comminuted fracture of left patella, subsequent encounter for closed fracture with routine healing: Secondary | ICD-10-CM | POA: Diagnosis not present

## 2017-07-30 ENCOUNTER — Telehealth: Payer: Self-pay | Admitting: Internal Medicine

## 2017-07-30 MED ORDER — ZOSTER VAC RECOMB ADJUVANTED 50 MCG/0.5ML IM SUSR: 0.5000 mL | Freq: Once | INTRAMUSCULAR | 0 refills | Status: AC

## 2017-07-30 NOTE — Telephone Encounter (Signed)
Okay to send 

## 2017-07-30 NOTE — Telephone Encounter (Signed)
Per [facility] the must have a prescription from provider to give.../lmb 

## 2017-07-30 NOTE — Telephone Encounter (Signed)
She does not need prescription can just go get shingrix.

## 2017-07-30 NOTE — Telephone Encounter (Signed)
Pt would like to receive the Shingrix Vaccine  Please send to [facility] on [address]  Please advise

## 2017-07-30 NOTE — Telephone Encounter (Signed)
Notified pt rx for shingrx sent to [facility].../lmb

## 2017-08-01 NOTE — Progress Notes (Signed)
Late entry due to missed g-code.   2017-08-19 0800  OT G-codes **NOT FOR INPATIENT CLASS**  Functional Assessment Tool Used Clinical judgement  Functional Limitation Self care  Self Care Current Status [phone]) CJ  Self Care Goal Status (E1740) CI  Aug 19, 2017 Nestor Lewandowsky, OTR/L Pager: [phone] For Delsa Bern, OT

## 2017-08-01 NOTE — Progress Notes (Signed)
Late entry for missed g code 04/11/17:   08/02/2017 0800  PT G-Codes **NOT FOR INPATIENT CLASS**  Functional Assessment Tool Used AM-PAC 6 Clicks Basic Mobility  Functional Limitation Mobility: Walking and moving around  Mobility: Walking and Moving Around Current Status [phone]) CI  Mobility: Walking and Moving Around Goal Status [phone]) CI  [facility] Acute Rehabilitation [phone] (pager)

## 2017-08-02 NOTE — Progress Notes (Signed)
G-Code Late Entry - OT Treatment    May 17, 2017 1654  OT G-codes **NOT FOR INPATIENT CLASS**  Functional Assessment Tool Used Clinical judgement  Functional Limitation Self care  Self Care Current Status [phone]) CI  Self Care Goal Status (T2458) [facility]  Self Care Discharge Status [phone]) [place], OTR/L Acute Rehab Pager: [phone] Office: [phone]

## 2017-08-12 ENCOUNTER — Ambulatory Visit (INDEPENDENT_AMBULATORY_CARE_PROVIDER_SITE_OTHER): Payer: Medicare Other

## 2017-08-12 ENCOUNTER — Other Ambulatory Visit (INDEPENDENT_AMBULATORY_CARE_PROVIDER_SITE_OTHER): Payer: Medicare Other

## 2017-08-12 DIAGNOSIS — E1065 Type 1 diabetes mellitus with hyperglycemia: Secondary | ICD-10-CM | POA: Diagnosis not present

## 2017-08-12 DIAGNOSIS — Z23 Encounter for immunization: Secondary | ICD-10-CM

## 2017-08-12 LAB — LIPID PANEL
Cholesterol: 152 mg/dL (ref 0–200)
HDL: 55.4 mg/dL (ref >39.00)
LDL Cholesterol: 84 mg/dL (ref 0–99)
NONHDL: 96.79
TRIGLYCERIDES: 62 mg/dL (ref 0.0–149.0)
Total CHOL/HDL Ratio: 3
VLDL: 12.4 mg/dL (ref 0.0–40.0)

## 2017-08-12 LAB — COMPREHENSIVE METABOLIC PANEL
ALK PHOS: 106 U/L (ref 39–117)
ALT: 17 U/L (ref 0–35)
AST: 14 U/L (ref 0–37)
Albumin: 3.9 g/dL (ref 3.5–5.2)
BILIRUBIN TOTAL: 0.4 mg/dL (ref 0.2–1.2)
BUN: 16 mg/dL (ref 6–23)
CALCIUM: 9.2 mg/dL (ref 8.4–10.5)
CO2: 28 mEq/L (ref 19–32)
CREATININE: 0.74 mg/dL (ref 0.40–1.20)
Chloride: 102 mEq/L (ref 96–112)
GFR: 82.55 mL/min (ref >60.00)
GLUCOSE: 225 mg/dL — AB (ref 70–99)
Potassium: 4.4 mEq/L (ref 3.5–5.1)
Sodium: 136 mEq/L (ref 135–145)
TOTAL PROTEIN: 6.9 g/dL (ref 6.0–8.3)

## 2017-08-12 LAB — HEMOGLOBIN A1C: Hgb A1c MFr Bld: 7.5 % — ABNORMAL HIGH (ref 4.6–6.5)

## 2017-08-15 ENCOUNTER — Ambulatory Visit: Payer: Medicare Other | Admitting: Endocrinology

## 2017-08-17 NOTE — Progress Notes (Signed)
Patient ID: [name], female   DOB: Jul 08, 1946, 71 y.o.   MRN: [id number]   Reason for visit: DIABETES followup.  Diagnosis: Type 1 diabetes mellitus, date of diagnosis: 1979.    HISTORY of present illness:   Insulin Pump: CURRENT brand: [id number]  The pump settings are: Midnight = 0.9,  2 AM  0.8, 5 AM = 1.9, 8 AM = 1.7, 11 AM = 1.0; 5 PM = 2.1, 7 PM = 2.6 and 10 PM = 1.7.   Total daily insulin = 44.5 units, 80% basal   Carbohydrate ratio 1:15 breakfast, lunch and 1:7 at dinner; sensitivity 1: 35; target  110-30  Insulin on board indicator on, duration 4 hrs   DIABETES: She has had long-standing diabetes with A1c levels usually above target. In 10/13 ACTOS was restarted because of her large insulin requirement and diabetic dyslipidemia.  Her blood sugars improved significantly reduced.  Also has been taking metformin as insulin sensitizer  RECENT history:  Her A1c is now 7.5 and has been as high as 8.3 and last time was 7.1   Current blood sugar patterns from pump and Sensor download and interpretation of her  D[employer] CGM download:   Her Sensor has been active for about only 46% of the time  AVERAGE blood sugar with her Sensor is 185  Her blood sugar patterns most consistent abnormality has been met sugars averaging close to 200 or more after about 3 PM and this is occurring fairly frequently on the day she has had the sensor  She said that she is having difficulty keeping her receiver charged and does not have it on consistently  On the blood sugar made him to be low normal around noontime or before especially recently her blood sugars are mostly running higher later in the day  She is again not able to pay attention to her diabetes needs and is stressed with multiple issues related to her family  Eating out frequently when she is traveling during the day but frequently also does not appear to have any plans meals or possibly missing boluses   Blood sugars on  since early at least 200 or more after around 3 PM which is fairly consistent  Blood sugars after evening meal are rising significantly although to a variable extent and with a peak of about 300  Blood sugar data from fingersticks and CGM:  She has 48% readings within target  PRE-MEAL Fasting Lunch Dinner Bedtime Overall  Glucose range:  55-316        Mean/median:  167    200   185+/-78   POST-MEAL PC Breakfast PC Lunch PC Dinner  Glucose range:     Mean/median:   290    Hypoglycemic awareness: Has symptoms of feeling tired, headache, sweaty. Variable recognition threshold present  Sometimes may not be aware during the night and will be notified by her sensor that the sugar is low  She has symptoms when blood glucose is less than 60. Uses glucose tablets for treatment when not at home but otherwise eats a snack .    DIET has been usually fairly controlled with carbohydrate and fat intake, tends to eat out on weekends 5   Again her mealtimes are variable.  Usually trying to have low fat meals  Food preferences: Yogurt And cottage cheese for breakfast usually.   Physical activity: exercise: None recently Historically after exercise blood sugar will get lower in 1-2 hrs but sometimes it may get  lower while walking and she carries glucose tablets with her. .  Wt Readings from Last 3 Encounters:  08/18/17 199 lb (90.3 kg)  05/15/17 197 lb 6.4 oz (89.5 kg)  04/11/17 203 lb (92.1 kg)    LABS:  Lab Results  Component Value Date   HGBA1C 7.5 (H) 08/12/2017   HGBA1C 7.1 (H) 05/12/2017   HGBA1C 7.5 (H) 04/11/2017   Lab Results  Component Value Date   MICROALBUR <0.7 02/04/2017   LDLCALC 84 08/12/2017   CREATININE 0.74 08/12/2017    Lab on 08/12/2017  Component Date Value Ref Range Status  . Hgb A1c MFr Bld 08/12/2017 7.5* 4.6 - 6.5 % Final   Glycemic Control Guidelines for People with Diabetes:Non Diabetic:  <6%Goal of Therapy: <7%Additional Action Suggested:  >8%   .  Sodium 08/12/2017 136  135 - 145 mEq/L Final  . Potassium 08/12/2017 4.4  3.5 - 5.1 mEq/L Final  . Chloride 08/12/2017 102  96 - 112 mEq/L Final  . CO2 08/12/2017 28  19 - 32 mEq/L Final  . Glucose, Bld 08/12/2017 225* 70 - 99 mg/dL Final  . BUN 08/12/2017 16  6 - 23 mg/dL Final  . Creatinine, Ser 08/12/2017 0.74  0.40 - 1.20 mg/dL Final  . Total Bilirubin 08/12/2017 0.4  0.2 - 1.2 mg/dL Final  . Alkaline Phosphatase 08/12/2017 106  39 - 117 U/L Final  . AST 08/12/2017 14  0 - 37 U/L Final  . ALT 08/12/2017 17  0 - 35 U/L Final  . Total Protein 08/12/2017 6.9  6.0 - 8.3 g/dL Final  . Albumin 08/12/2017 3.9  3.5 - 5.2 g/dL Final  . Calcium 08/12/2017 9.2  8.4 - 10.5 mg/dL Final  . GFR 08/12/2017 82.55  >60.00 mL/min Final  . Cholesterol 08/12/2017 152  0 - 200 mg/dL Final   ATP III Classification       Desirable:  < 200 mg/dL               Borderline High:  200 - 239 mg/dL          High:  > = 240 mg/dL  . Triglycerides 08/12/2017 62.0  0.0 - 149.0 mg/dL Final   Normal:  <150 mg/dLBorderline High:  150 - 199 mg/dL  . HDL 08/12/2017 55.40  >39.00 mg/dL Final  . VLDL 08/12/2017 12.4  0.0 - 40.0 mg/dL Final  . LDL Cholesterol 08/12/2017 84  0 - 99 mg/dL Final  . Total CHOL/HDL Ratio 08/12/2017 3   Final                  Men          Women1/2 Average Risk     3.4          3.3Average Risk          5.0          4.42X Average Risk          9.6          7.13X Average Risk          15.0          11.0                      . NonHDL 08/12/2017 96.79   Final   NOTE:  Non-HDL goal should be 30 mg/dL higher than patient's LDL goal (i.e. LDL goal of < 70 mg/dL, would have non-HDL goal of < 100 mg/dL)  Allergies as of 08/18/2017      Reactions   Sulfa Antibiotics Rash      Medication List       Accurate as of 08/18/17  8:59 AM. Always use your most recent med list.          aspirin 325 MG EC tablet Take 325 mg by mouth daily.   CALCIUM + D PO Take 600 mg by mouth 2 (two) times daily.     cholecalciferol 1000 units tablet Commonly known as:  VITAMIN D Take 1,000 Units by mouth 2 (two) times daily.   clopidogrel 75 MG tablet Commonly known as:  PLAVIX Take 1 tablet (75 mg total) by mouth daily.   glucose blood test strip Commonly known as:  BAYER CONTOUR TEST Use as instructed to check sugar 6 times daily. Dx Code E10.65   insulin aspart 100 UNIT/ML injection Commonly known as:  NOVOLOG Use Max 200 units per day in insulin pump   insulin pump Soln Inject into the skin. Novolog Vial, max 200 units per day with pump   metFORMIN 500 MG 24 hr tablet Commonly known as:  GLUCOPHAGE-XR Take 2 tablets by mouth  daily   metoprolol succinate 100 MG 24 hr tablet Commonly known as:  TOPROL-XL TAKE 1 TABLET (100 MG TOTAL) BY MOUTH DAILY.   niacin 1000 MG CR tablet Commonly known as:  NIASPAN Take 1 tablet by mouth at  bedtime   nitroGLYCERIN 0.4 MG SL tablet Commonly known as:  NITROSTAT Place 1 tablet (0.4 mg total) under the tongue every 5 (five) minutes as needed for chest pain.   pioglitazone 15 MG tablet Commonly known as:  ACTOS Take 1 tablet (15 mg total) by mouth daily.   rosuvastatin 40 MG tablet Commonly known as:  CRESTOR Take 1 tablet (40 mg total) by mouth daily.       Allergies:  Allergies  Allergen Reactions  . Sulfa Antibiotics Rash    Past Medical History:  Diagnosis Date  . Arthritis    OA  . Atrial flutter ([place])    a. s/p TEE/DCCV 08/16/13 (normal LV function, no LAA thrombus).b. s/p ablation by Dr Lovena Le 09-23-2013  . Coronary artery disease    2009 LAD Promus stent  . Dysrhythmia    Aflutter, no issues after ablation 2014  . Fibroid   . Hyperlipidemia   . Insulin pump in place   . Osteopenia   . Type 1 diabetes mellitus on insulin therapy ([place])   . Valvular heart disease    a. Mild MR/TR by TEE 08/2013.    Past Surgical History:  Procedure Laterality Date  . ABLATION  09-23-2013   CTI by Dr Lovena Le  . APPENDECTOMY     . APPLICATION OF WOUND VAC Left 04/11/2017   Procedure: APPLICATION OF WOUND VAC LEFT KNEE;  Surgeon: Rod Can, MD;  Location: [place];  Service: Orthopedics;  Laterality: Left;  . ATRIAL FLUTTER ABLATION N/A 09/23/2013   Procedure: ATRIAL FLUTTER ABLATION;  Surgeon: Evans Lance, MD;  Location[facility] CATH LAB;  Service: Cardiovascular;  Laterality: N/A;  . CARDIOVERSION N/A 08/16/2013   Procedure: CARDIOVERSION;  Surgeon: Thayer Headings, MD;  Location: [place];  Service: Cardiovascular;  Laterality: N/A;  spoke with [name]  . CATARACT EXTRACTION Bilateral   . COLONOSCOPY    . CORONARY ANGIOPLASTY WITH STENT PLACEMENT    . MYOMECTOMY  1977  . ORIF PATELLA Left 04/11/2017  . ORIF PATELLA Left 04/11/2017   Procedure:  OPEN REDUCTION INTERNAL (ORIF) FIXATION PATELLA LEFT KNEE;  Surgeon: Rod Can, MD;  Location: [place];  Service: Orthopedics;  Laterality: Left;  . PELVIC LAPAROSCOPY    . TEE WITHOUT CARDIOVERSION N/A 08/16/2013   Procedure: TRANSESOPHAGEAL ECHOCARDIOGRAM (TEE);  Surgeon: Thayer Headings, MD;  Location: [facility] ENDOSCOPY;  Service: Cardiovascular;  Laterality: N/A;    Family History  Problem Relation Age of Onset  . CAD Father 32       died age 98  . CAD Brother 81       small vessel disease  . Atrial fibrillation Mother   . Diabetes Mother   . Hypertension Mother   . CAD Cousin        paternal  . CAD Paternal Grandfather        early onset  . Colon cancer Neg Hx     Social History:  reports that she has never smoked. She has never used smokeless tobacco. She reports that she drinks about 3.0 oz of alcohol per week . She reports that she does not use drugs.  REVIEW of systems:   She has had diabetic dyslipidemia with significantly high LDL particle number and this has been managed with Crestor 40 mg and Niaspan 1000 mg In 3/16 her particle number was below 1000 and Her last level was excellent at 856  Her last LDL is below 70, HDL excellent However Despite  reminders she has not taken niacin Regularly; she will have flushing/itching unless she is taking 325 mg aspirin with her niacin   Lab Results  Component Value Date   CHOL 152 08/12/2017   HDL 55.40 08/12/2017   LDLCALC 84 08/12/2017   LDLDIRECT 79.0 01/11/2015   TRIG 62.0 08/12/2017   CHOLHDL 3 08/12/2017   She has had atrial arrhythmias treated with metoprolol    BP 140/82   Pulse 76   Ht 5' 9.5" (1.765 m)   Wt 199 lb (90.3 kg)   SpO2 98%   BMI 28.97 kg/m    ASSESSMENT/PLAN:  DIABETES type 1 with fair control, A1c is good at 7.1 % and improving now  See history of present illness for detailed discussion of her current blood sugar patterns and management with her pump and Review of her home continuous glucose monitoring   She has Not used her sensor consistently and only have about 6 days of recording for review today However blood sugars are mostly high in the afternoons and late evenings without necessarily high-fat meals during the day  Blood sugars are again higher after supper despite taking correction at suppertime  Blood sugars may be low normal midday and only once overnight She is just doing a little walking with her dog but no formal exercise  Recommendations:  Needs to  Cut back on high-fat  meals   again reminded her to add 30-50% more insulin for higher fat meals and extended boluses   at least for now she can use 1:7 carbohydrate coverage  After 5 PM for dinner  She needs lower basal rate around midday and higher basal rate starting at least around 2 PM until 10 PM  Basal rate changes:  11 AM-2 PM = 0.8.  2 PM- 5 PM = 1.1, 5 PM = 2.2 and 7 PM = 2.8  Call if having nocturnal hypoglycemia  Or any other unexpected lo sugars  Correction factor 1: 30 after 6 PM  Start regular walking when able to  Continue Actos and metformin for insulin resistance  HYPERLIPIDEMIA: Her LDL is higher, partly because of poor diet and not taking niacin Has history of  high LDL particle number and needs better management She needs to take her niacin Daily with full dose aspirin She may improve her lipid panel with cutting back on high-fat foods especially fast food Otherwise consider increasing Crestor  Counseling time on subjects discussed in assessment and plan sections is over 50% of today's 25 minute visit   Jamiah Homeyer 08/18/17   Note: This office note was prepared with Dragon voice recognition system technology. Any transcriptional errors that result from this process are unintentional.

## 2017-08-18 ENCOUNTER — Encounter: Payer: Self-pay | Admitting: Endocrinology

## 2017-08-18 ENCOUNTER — Ambulatory Visit (INDEPENDENT_AMBULATORY_CARE_PROVIDER_SITE_OTHER): Payer: Medicare Other | Admitting: Endocrinology

## 2017-08-18 VITALS — BP 140/82 | HR 76 | Ht 69.5 in | Wt 199.0 lb

## 2017-08-18 DIAGNOSIS — E782 Mixed hyperlipidemia: Secondary | ICD-10-CM | POA: Diagnosis not present

## 2017-08-18 DIAGNOSIS — E1065 Type 1 diabetes mellitus with hyperglycemia: Secondary | ICD-10-CM | POA: Diagnosis not present

## 2017-08-18 NOTE — Patient Instructions (Signed)
Less high fat foods

## 2017-08-30 ENCOUNTER — Ambulatory Visit (INDEPENDENT_AMBULATORY_CARE_PROVIDER_SITE_OTHER): Payer: Medicare Other

## 2017-08-30 ENCOUNTER — Encounter (HOSPITAL_COMMUNITY): Payer: Self-pay | Admitting: *Deleted

## 2017-08-30 ENCOUNTER — Ambulatory Visit (HOSPITAL_COMMUNITY)
Admission: EM | Admit: 2017-08-30 | Discharge: 2017-08-30 | Disposition: A | Discharge status: Home / Self Care | Payer: Medicare Other | Attending: Family Medicine | Admitting: Family Medicine

## 2017-08-30 DIAGNOSIS — S82832A Other fracture of upper and lower end of left fibula, initial encounter for closed fracture: Secondary | ICD-10-CM

## 2017-08-30 DIAGNOSIS — M7989 Other specified soft tissue disorders: Secondary | ICD-10-CM | POA: Diagnosis not present

## 2017-08-30 DIAGNOSIS — M79672 Pain in left foot: Secondary | ICD-10-CM | POA: Diagnosis not present

## 2017-08-30 MED ORDER — TRAMADOL HCL 50 MG PO TABS: 50.0000 mg | ORAL_TABLET | Freq: Four times a day (QID) | ORAL | 0 refills | Status: DC | PRN

## 2017-08-30 MED ORDER — ACETAMINOPHEN 500 MG PO TABS: 1000.0000 mg | ORAL_TABLET | Freq: Three times a day (TID) | ORAL | 0 refills | Status: AC

## 2017-08-30 NOTE — Discharge Instructions (Signed)
Tylenol as directed. Tramadol for break through pain. Wear cam walker and use crutches until seen by orthopedics. Elevation and ice compress as needed.

## 2017-08-30 NOTE — ED Provider Notes (Addendum)
[name]    CSN: [id number] Arrival date & time: 08/30/17  1202     History   Chief Complaint Chief Complaint  Patient presents with  . Ankle Injury    HPI [name] is a 71 y.o. female.   71 year old female with history of CAD, a flutter, HLD, DM1, comes in for 2 day history of left ankle pain after fall. Patient states she tripped/slipped on wet flooring yesterday and fell on her left side, inverting her left ankle as she fell. She denies head injury, LOC. She was unable to bear weight after the fall. She has been elevating her leg with ice compress without relief. She took ibuprofen 800mg  this morning with some relief. She has been using her crutches and wearing cam walker to prevent bearing weight. Denies numbness/tingling.       Past Medical History:  Diagnosis Date  . Arthritis    OA  . Atrial flutter ([place])    a. s/p TEE/DCCV 08/16/13 (normal LV function, no LAA thrombus).b. s/p ablation by Dr Lovena Le 09-23-2013  . Coronary artery disease    2009 LAD Promus stent  . Dysrhythmia    Aflutter, no issues after ablation 2014  . Fibroid   . Hyperlipidemia   . Insulin pump in place   . Osteopenia   . Type 1 diabetes mellitus on insulin therapy ([place])   . Valvular heart disease    a. Mild MR/TR by TEE 08/2013.    Patient Active Problem List   Diagnosis Date Noted  . Displaced comminuted fracture of left patella, initial encounter for closed fracture 04/11/2017  . Left patella fracture 04/11/2017  . Left ankle swelling 02/14/2017  . Synovitis of toe 01/22/2017  . Trochanteric bursitis, right hip 01/22/2017  . Travel advice encounter 06/13/2016  . Closed fracture of lateral malleolus of right ankle 02/07/2016  . Routine general medical examination at a health care facility 11/08/2015  . Hyperlipidemia 03/16/2015  . 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 Metatarsal stress fracture of left foot 11/21/2014  . Cerumen  impaction 11/11/2014  . Plantar fasciitis, left 08/17/2014  . Other and unspecified hyperlipidemia 07/20/2013  . Uncontrolled type 1 diabetes mellitus ([place]) 05/12/2013  . Chronic insomnia 03/25/2012  . Osteopenia   . Coronary artery disease 04/05/2011  . Atrial flutter ([place]) 04/05/2011    Past Surgical History:  Procedure Laterality Date  . ABLATION  09-23-2013   CTI by Dr Lovena Le  . APPENDECTOMY    . APPLICATION OF WOUND VAC Left 04/11/2017   Procedure: APPLICATION OF WOUND VAC LEFT KNEE;  Surgeon: Rod Can, MD;  Location: [place];  Service: Orthopedics;  Laterality: Left;  . ATRIAL FLUTTER ABLATION N/A 09/23/2013   Procedure: ATRIAL FLUTTER ABLATION;  Surgeon: Evans Lance, MD;  [facility] CATH LAB;  Service: Cardiovascular;  Laterality: N/A;  . CARDIOVERSION N/A 08/16/2013   Procedure: CARDIOVERSION;  Surgeon: Thayer Headings, MD;  Location: [place];  Service: Cardiovascular;  Laterality: N/A;  spoke with [name]  . CATARACT EXTRACTION Bilateral   . COLONOSCOPY    . CORONARY ANGIOPLASTY WITH STENT PLACEMENT    . MYOMECTOMY  1977  . ORIF PATELLA Left 04/11/2017  . ORIF PATELLA Left 04/11/2017   Procedure: OPEN REDUCTION INTERNAL (ORIF) FIXATION PATELLA LEFT KNEE;  Surgeon: Rod Can, MD;  Location: [place];  Service: Orthopedics;  Laterality: Left;  . PELVIC LAPAROSCOPY    . TEE WITHOUT CARDIOVERSION N/A 08/16/2013  Procedure: TRANSESOPHAGEAL ECHOCARDIOGRAM (TEE);  Surgeon: Thayer Headings, MD;  Location[facility] ENDOSCOPY;  Service: Cardiovascular;  Laterality: N/A;    OB History    Gravida Para Term Preterm AB Living   2       2 0   SAB TAB Ectopic Multiple Live Births   2               Home Medications    Prior to Admission medications   Medication Sig Start Date End Date Taking? Authorizing Provider  aspirin 325 MG EC tablet Take 325 mg by mouth daily.   Yes [provider]  Calcium Carbonate-Vitamin D (CALCIUM + D PO) Take 600 mg by mouth 2 (two)  times daily.    Yes [provider]  cholecalciferol (VITAMIN D) 1000 units tablet Take 1,000 Units by mouth 2 (two) times daily.    Yes [provider]  clopidogrel (PLAVIX) 75 MG tablet Take 1 tablet (75 mg total) by mouth daily. 05/15/17  Yes Elayne Snare, MD  glucose blood (BAYER CONTOUR TEST) test strip Use as instructed to check sugar 6 times daily. Dx Code W29.93 07/04/16  Yes Elayne Snare, MD  Insulin Aspart (INSULIN PUMP) 100 unit/ml SOLN Inject into the skin. Novolog Vial, max 200 units per day with pump   Yes [provider]  insulin aspart (NOVOLOG) 100 UNIT/ML injection Use Max 200 units per day in insulin pump Patient taking differently: Use Max 200 units per day in insulin pump Basal 35.8 units per 24 hours Bolus: midnight -5pm 1 unit per 15 carbs, 5pm -midnight = 1 per 7 carbs 12/20/16  Yes Elayne Snare, MD  metFORMIN (GLUCOPHAGE-XR) 500 MG 24 hr tablet Take 2 tablets by mouth  daily 05/15/17  Yes Elayne Snare, MD  metoprolol succinate (TOPROL-XL) 100 MG 24 hr tablet TAKE 1 TABLET (100 MG TOTAL) BY MOUTH DAILY. 03/12/17  Yes [name], Wonda Cheng, MD  niacin (NIASPAN) 1000 MG CR tablet Take 1 tablet by mouth at  bedtime 12/20/16  Yes Elayne Snare, MD  pioglitazone (ACTOS) 15 MG tablet Take 1 tablet (15 mg total) by mouth daily. 02/13/17  Yes Elayne Snare, MD  rosuvastatin (CRESTOR) 40 MG tablet Take 1 tablet (40 mg total) by mouth daily. 05/15/17  Yes Elayne Snare, MD  acetaminophen (TYLENOL) 500 MG tablet Take 2 tablets (1,000 mg total) by mouth 3 (three) times daily. 08/30/17 09/09/17  Ok Edwards, PA-C  nitroGLYCERIN (NITROSTAT) 0.4 MG SL tablet Place 1 tablet (0.4 mg total) under the tongue every 5 (five) minutes as needed for chest pain. 11/13/16   [name], Wonda Cheng, MD  traMADol (ULTRAM) 50 MG tablet Take 1 tablet (50 mg total) by mouth every 6 (six) hours as needed for severe pain. 08/30/17   Ok Edwards, PA-C    Family History Family History  Problem Relation Age of Onset    . CAD Father 45       died age 40  . CAD Brother 58       small vessel disease  . Atrial fibrillation Mother   . Diabetes Mother   . Hypertension Mother   . CAD Cousin        paternal  . CAD Paternal Grandfather        early onset  . Colon cancer Neg Hx     Social History Social History  Substance Use Topics  . Smoking status: Never Smoker  . Smokeless tobacco: Never Used  . Alcohol use  3.0 oz/week    5 Glasses of wine per week     Comment: on weekends     Allergies   Sulfa antibiotics   Review of Systems Review of Systems  Reason unable to perform ROS: See HPI as above.     Physical Exam Triage Vital Signs ED Triage Vitals  Enc Vitals Group     BP 08/30/17 1216 131/70     Pulse Rate 08/30/17 1216 76     Resp 08/30/17 1216 16     Temp 08/30/17 1216 98.7 F (37.1 C)     Temp Source 08/30/17 1216 Oral     SpO2 08/30/17 1216 100 %     Weight --      Height --      Head Circumference --      Peak Flow --      Pain Score 08/30/17 1217 8     Pain Loc --      Pain Edu? --      Excl. in [place]? --    No data found.   Updated Vital Signs BP 131/70   Pulse 76   Temp 98.7 F (37.1 C) (Oral)   Resp 16   SpO2 100%   Physical Exam  Constitutional: She is oriented to person, place, and time. She appears well-developed and well-nourished. No distress.  HENT:  Head: Normocephalic and atraumatic.  Eyes: Pupils are equal, round, and reactive to light. Conjunctivae are normal.  Musculoskeletal:  Swelling of the left ankle. No contusion seen. Tenderness on palpation of the left lateral malleolus and 5th metatarsal. Full ROM. Strength deferred. Sensation intact. Pedal pulses 2+ and equal bilaterally. Cap refill unable to assess due to nail polish.   Neurological: She is alert and oriented to person, place, and time.     UC Treatments / Results  Labs (all labs ordered are listed, but only abnormal results are displayed) Labs Reviewed - No data to  display  EKG  EKG Interpretation None       Radiology Dg Ankle Complete Left  Result Date: 08/30/2017 CLINICAL DATA:  Fall, pain. EXAM: LEFT ANKLE COMPLETE - 3+ VIEW COMPARISON:  None. FINDINGS: Slightly displaced fracture within the distal fibula (upper lateral malleolus). Distal tibia appears intact and normally aligned. Ankle mortise is symmetric. Visualized portions of the hindfoot and midfoot appear intact and normally aligned. At least mild diffuse osteopenia. Soft tissue swelling about the left ankle, lateral greater than medial. IMPRESSION: 1. Slightly displaced fracture of the distal fibula (upper lateral malleolus). 2. No other fracture seen. 3. Soft tissue swelling. 4. Osteopenia. Electronically Signed   By: Franki Cabot M.D.   On: 08/30/2017 13:06   Dg Foot Complete Left  Result Date: 08/30/2017 CLINICAL DATA:  Fall, pain. EXAM: LEFT FOOT - COMPLETE 3+ VIEW COMPARISON:  None. FINDINGS: There is diffuse osteopenia which limits characterization of osseous detail, however, there is no fracture line or displaced fracture fragment identified within the foot. Slightly displaced fracture noted with distal left fibula. IMPRESSION: 1. Slightly displaced fracture of the distal left fibula (upper lateral malleolus). 2. No fracture or dislocation seen within the left foot. 3. Osteopenia. Electronically Signed   By: Franki Cabot M.D.   On: 08/30/2017 13:07    Procedures Procedures (including critical care time)  Medications Ordered in UC Medications - No data to display   Initial Impression / Assessment and Plan / UC Course  I have reviewed the triage vital signs and the nursing notes.  Pertinent labs & imaging results that were available during my care of the patient were reviewed by me and considered in my medical decision making (see chart for details).    Discussed xray results with patient. Cam walker and crutches until cleared by orthopedics. Patient with type 1 DM and on  plavix, will defer NSAIDs for now. Tylenol as directed. Tramadol for breakthrough pain. Follow up with orthopedics for further evaluation and treatment needed.   Final Clinical Impressions(s) / UC Diagnoses   Final diagnoses:  Closed fracture of distal end of left fibula, unspecified fracture morphology, initial encounter    New Prescriptions New Prescriptions   ACETAMINOPHEN (TYLENOL) 500 MG TABLET    Take 2 tablets (1,000 mg total) by mouth 3 (three) times daily.   TRAMADOL (ULTRAM) 50 MG TABLET    Take 1 tablet (50 mg total) by mouth every 6 (six) hours as needed for severe pain.     Controlled Substance Prescriptions [place] Controlled Substance Registry consulted? Yes, I have consulted the  Controlled Substances Registry for this patient, and feel the risk/benefit ratio today is favorable for proceeding with this prescription for a controlled substance.   Ok Edwards, PA-C 08/30/17 [place], [place] V, PA-C 08/30/17 1336

## 2017-08-30 NOTE — ED Triage Notes (Signed)
Reports tripping and falling yesterday, twisting LLE.  C/O pain from left posterior knee down into left ankle.  C/O significant left ankle pain.  Denies any parasthesias. LLE CMS intact. Wearing cam walker.

## 2017-08-30 NOTE — ED Notes (Signed)
Discharge time delay secondary to receiving radiology disc.

## 2017-09-01 DIAGNOSIS — M25572 Pain in left ankle and joints of left foot: Secondary | ICD-10-CM | POA: Diagnosis not present

## 2017-09-01 DIAGNOSIS — S82822A Torus fracture of lower end of left fibula, initial encounter for closed fracture: Secondary | ICD-10-CM | POA: Diagnosis not present

## 2017-09-02 ENCOUNTER — Telehealth: Payer: Self-pay | Admitting: Cardiovascular Disease

## 2017-09-02 ENCOUNTER — Other Ambulatory Visit: Payer: Self-pay | Admitting: Endocrinology

## 2017-09-02 ENCOUNTER — Telehealth: Payer: Self-pay | Admitting: Endocrinology

## 2017-09-02 NOTE — Telephone Encounter (Signed)
Patient should have a ninety day refill left with [employer] per rx below. I called patient and made her aware of this and she has verified that her bottle indicates that she does have refills remaining. She will call [employer] and request that they process and ship this to her. Patient appreciative for my call.   Medication Detail    Disp Refills Start End   metoprolol succinate (TOPROL-XL) 100 MG 24 hr tablet 90 tablet 2 03/12/2017    Sig: TAKE 1 TABLET (100 MG TOTAL) BY MOUTH DAILY.   Sent to pharmacy as: metoprolol succinate (TOPROL-XL) 100 MG 24 hr tablet   E-Prescribing Status: Receipt confirmed by pharmacy (03/12/2017 11:46 AM EDT)   Pharmacy   [place], [place]

## 2017-09-02 NOTE — Telephone Encounter (Addendum)
Patient need a refill for metformin,clopidogrel, pioglitazone 90 day supply, send to [facility]

## 2017-09-02 NOTE — Telephone Encounter (Signed)
New message     *STAT* If patient is at the pharmacy, call can be transferred to refill team.   1. Which medications need to be refilled? (please list name of each medication and dose if known) metoprolol succinate (TOPROL-XL) 100 MG 24 hr tablet  2. Which pharmacy/location (including street and city if local pharmacy) is medication to be sent to?[facility] home delivery  3. Do they need a 30 day or 90 day supply? [place]

## 2017-09-03 ENCOUNTER — Other Ambulatory Visit: Payer: Self-pay

## 2017-09-03 MED ORDER — PIOGLITAZONE HCL 15 MG PO TABS: 15.0000 mg | ORAL_TABLET | Freq: Every day | ORAL | 1 refills | Status: DC

## 2017-09-03 MED ORDER — METFORMIN HCL ER 500 MG PO TB24: ORAL_TABLET | ORAL | 1 refills | Status: DC

## 2017-09-03 NOTE — Telephone Encounter (Signed)
Called patient and let her know these prescriptions were sent to [facility] delivery service.

## 2017-09-08 ENCOUNTER — Telehealth: Payer: Self-pay | Admitting: Endocrinology

## 2017-09-08 ENCOUNTER — Other Ambulatory Visit: Payer: Self-pay | Admitting: Endocrinology

## 2017-09-08 ENCOUNTER — Other Ambulatory Visit: Payer: Self-pay | Admitting: Cardiovascular Disease

## 2017-09-08 NOTE — Telephone Encounter (Signed)
Patient needs prescription for Novalog Insulin 100 to be sent to [facility] . She only has 2 vials left.

## 2017-09-12 NOTE — Telephone Encounter (Signed)
Refill submitted on 09/08/2017.

## 2017-09-15 DIAGNOSIS — S82822D Torus fracture of lower end of left fibula, subsequent encounter for fracture with routine healing: Secondary | ICD-10-CM | POA: Diagnosis not present

## 2017-09-30 DIAGNOSIS — S82042D Displaced comminuted fracture of left patella, subsequent encounter for closed fracture with routine healing: Secondary | ICD-10-CM | POA: Diagnosis not present

## 2017-10-01 DIAGNOSIS — S82822D Torus fracture of lower end of left fibula, subsequent encounter for fracture with routine healing: Secondary | ICD-10-CM | POA: Diagnosis not present

## 2017-10-17 ENCOUNTER — Encounter: Payer: Self-pay | Admitting: Endocrinology

## 2017-10-17 ENCOUNTER — Ambulatory Visit (INDEPENDENT_AMBULATORY_CARE_PROVIDER_SITE_OTHER): Payer: Medicare Other | Admitting: Endocrinology

## 2017-10-17 VITALS — BP 136/76 | HR 70 | Ht 69.5 in | Wt 202.4 lb

## 2017-10-17 DIAGNOSIS — E1065 Type 1 diabetes mellitus with hyperglycemia: Secondary | ICD-10-CM

## 2017-10-17 DIAGNOSIS — E782 Mixed hyperlipidemia: Secondary | ICD-10-CM | POA: Diagnosis not present

## 2017-10-17 NOTE — Patient Instructions (Signed)
Change Metformin 1 in am or lunch and 2 at dinner  Extra 30%-40% for hi fat

## 2017-10-17 NOTE — Progress Notes (Signed)
Patient ID: [name], female   DOB: 10/19/1946, 71 y.o.   MRN: [id number]   Reason for visit: DIABETES followup.  Diagnosis: Type 1 diabetes mellitus, date of diagnosis: 1979.    HISTORY of present illness:   Insulin Pump: CURRENT brand: 630  The pump settings are: Midnight = 0.9,  2 AM  0.8, 5 AM = 1.9, 8 AM = 1.7, 11 AM = 1.0; 5 PM = 2.1, 7 PM = 2.6 and 10 PM = 1.7.   Total daily insulin = up to 60 units   Carbohydrate ratio 1:15 breakfast, lunch and 1:7 at dinner; sensitivity 1: 35 until 6 PM and then 1:30; target  110-30  Insulin on board indicator on, duration 4 hrs   DIABETES: She has had long-standing diabetes with A1c levels usually above target. In 10/13 ACTOS was restarted because of her large insulin requirement and diabetic dyslipidemia.  Her blood sugars improved significantly reduced.  Also has been taking metformin as insulin sensitizer  RECENT history:   Her A1c is most recently 7.5 in October and has been as high as 8.3 and last time was 7.1   Current blood sugar patterns from pump and Sensor download and interpretation of her  D[employer] CGM download:   Her Sensor has been active for 79% of the time in the last 2 weeks, previously had not used it consistently and she is here for follow-up evaluation  Her blood sugar patterns show hyperglycemia occurring significantly between about 4-6 AM as also around 3-4 PM and to a variable extent after 10 PM  FASTING readings are still high because of nighttime hyperglycemia  Her blood sugars are generally near normal by lunchtime and only variably high at suppertime  She was advised on reducing high-fat meals and she is trying to do this but probably getting higher fat meals like hamburgers, pizza or [place] food 2 or 3 times a week  She only uses about 5-10 mg more on her carbohydrate coverage when she is eating a high fat meal although she is trying to do an extended bolus  She appears to have a marked  increase in blood sugars during the night starting to a variable extent after about 1 AM and most of his blood sugars are well over 200 during the night, averaging about 250 around 3-5 AM  Although she thinks that sometimes she will forget to BOLUS at lunchtime and has delayed boluses around 3-4 PM she still appears to have mostly high readings after her lunch meals  Hypoglycemia has been occasional but may occur in the mornings after doing a correction for high readings during the night  Blood sugar data from fingersticks and CGM:  She has 44%% readings within target, 53% over 180, 2% below 70 and 1% below 50  Mean values apply above for all meters except median for One Touch  PRE-MEAL Fasting Lunch Dinner Bedtime Overall  Glucose range:  1 61-320    58-229   182+/-72   Mean/median:         Hypoglycemic awareness: Has symptoms of feeling tired, headache, sweaty. Variable recognition threshold present  Sometimes may not be aware during the night and will be notified by her sensor that the sugar is low  She has symptoms when blood glucose is less than 60. Uses glucose tablets for treatment when not at home but otherwise eats a snack .    DIET has been usually fairly controlled with carbohydrate and fat intake,  tends to eat out on weekends 5   Again her mealtimes are variable.  Usually trying to have low fat meals  Food preferences: Yogurt And cottage cheese for breakfast usually.   Physical activity: exercise: None recently Historically after exercise blood sugar will get lower in 1-2 hrs but sometimes it may get lower while walking and she carries glucose tablets with her. .  Wt Readings from Last 3 Encounters:  10/17/17 202 lb 6.4 oz (91.8 kg)  08/18/17 199 lb (90.3 kg)  05/15/17 197 lb 6.4 oz (89.5 kg)    LABS:  Lab Results  Component Value Date   HGBA1C 7.5 (H) 08/12/2017   HGBA1C 7.1 (H) 05/12/2017   HGBA1C 7.5 (H) 04/11/2017   Lab Results  Component Value Date    MICROALBUR <0.7 02/04/2017   LDLCALC 84 08/12/2017   CREATININE 0.74 08/12/2017    No visits with results within 1 Week(s) from this visit.  Latest known visit with results is:  Abstract on 08/19/2017  Component Date Value Ref Range Status  . HM Diabetic Eye Exam 06/25/2017 No Retinopathy  No Retinopathy Final    Allergies as of 10/17/2017      Reactions   Sulfa Antibiotics Rash      Medication List        Accurate as of 10/17/17  8:26 AM. Always use your most recent med list.          aspirin 325 MG EC tablet Take 325 mg by mouth daily.   CALCIUM + D PO Take 600 mg by mouth 2 (two) times daily.   cholecalciferol 1000 units tablet Commonly known as:  VITAMIN D Take 1,000 Units by mouth 2 (two) times daily.   clopidogrel 75 MG tablet Commonly known as:  PLAVIX TAKE 1 TABLET DAILY   glucose blood test strip Commonly known as:  BAYER CONTOUR TEST Use as instructed to check sugar 6 times daily. Dx Code E10.65   insulin aspart 100 UNIT/ML injection Commonly known as:  NOVOLOG USE A MAX OF 200 UNITS PER DAY VIA INSULIN PUMP   insulin pump Soln Inject into the skin. Novolog Vial, max 200 units per day with pump   metFORMIN 500 MG 24 hr tablet Commonly known as:  GLUCOPHAGE-XR Take 2 tablets by mouth  daily   metoprolol succinate 100 MG 24 hr tablet Commonly known as:  TOPROL-XL TAKE 1 TABLET (100 MG TOTAL) BY MOUTH DAILY.   niacin 1000 MG CR tablet Commonly known as:  NIASPAN Take 1 tablet by mouth at  bedtime   nitroGLYCERIN 0.4 MG SL tablet Commonly known as:  NITROSTAT Place 1 tablet (0.4 mg total) under the tongue every 5 (five) minutes as needed for chest pain.   pioglitazone 15 MG tablet Commonly known as:  ACTOS Take 1 tablet (15 mg total) by mouth daily.   rosuvastatin 40 MG tablet Commonly known as:  CRESTOR Take 1 tablet (40 mg total) by mouth daily.   traMADol 50 MG tablet Commonly known as:  ULTRAM Take 1 tablet (50 mg total) by mouth  every 6 (six) hours as needed for severe pain.       Allergies:  Allergies  Allergen Reactions  . Sulfa Antibiotics Rash    Past Medical History:  Diagnosis Date  . Arthritis    OA  . Atrial flutter ([place])    a. s/p TEE/DCCV 08/16/13 (normal LV function, no LAA thrombus).b. s/p ablation by Dr Lovena Le 09-23-2013  . Coronary artery disease  2009 LAD Promus stent  . Dysrhythmia    Aflutter, no issues after ablation 2014  . Fibroid   . Hyperlipidemia   . Insulin pump in place   . Osteopenia   . Type 1 diabetes mellitus on insulin therapy ([place])   . Valvular heart disease    a. Mild MR/TR by TEE 08/2013.    Past Surgical History:  Procedure Laterality Date  . ABLATION  09-23-2013   CTI by Dr Lovena Le  . APPENDECTOMY    . APPLICATION OF WOUND VAC Left 04/11/2017   Procedure: APPLICATION OF WOUND VAC LEFT KNEE;  Surgeon: Rod Can, MD;  Location: [place];  Service: Orthopedics;  Laterality: Left;  . ATRIAL FLUTTER ABLATION N/A 09/23/2013   Procedure: ATRIAL FLUTTER ABLATION;  Surgeon: Evans Lance, MD;  Location: [facility] CATH LAB;  Service: Cardiovascular;  Laterality: N/A;  . CARDIOVERSION N/A 08/16/2013   Procedure: CARDIOVERSION;  Surgeon: Thayer Headings, MD;  Location: [place];  Service: Cardiovascular;  Laterality: N/A;  spoke with [name]  . CATARACT EXTRACTION Bilateral   . COLONOSCOPY    . CORONARY ANGIOPLASTY WITH STENT PLACEMENT    . MYOMECTOMY  1977  . ORIF PATELLA Left 04/11/2017  . ORIF PATELLA Left 04/11/2017   Procedure: OPEN REDUCTION INTERNAL (ORIF) FIXATION PATELLA LEFT KNEE;  Surgeon: Rod Can, MD;  Location: [place];  Service: Orthopedics;  Laterality: Left;  . PELVIC LAPAROSCOPY    . TEE WITHOUT CARDIOVERSION N/A 08/16/2013   Procedure: TRANSESOPHAGEAL ECHOCARDIOGRAM (TEE);  Surgeon: Thayer Headings, MD;  Location: [facility] ENDOSCOPY;  Service: Cardiovascular;  Laterality: N/A;    Family History  Problem Relation Age of Onset  . CAD Father 67        died age 58  . CAD Brother 78       small vessel disease  . Atrial fibrillation Mother   . Diabetes Mother   . Hypertension Mother   . CAD Cousin        paternal  . CAD Paternal Grandfather        early onset  . Colon cancer Neg Hx     Social History:  reports that  has never smoked. she has never used smokeless tobacco. She reports that she drinks about 3.0 oz of alcohol per week. She reports that she does not use drugs.  REVIEW of systems:   She has had diabetic dyslipidemia with significantly high LDL particle number and this has been managed with Crestor 40 mg and Niaspan 1000 mg In 3/16 her particle number was below 1000 and Her last level was excellent at 856  Her lipids are not as good in October and she is now trying to take her niacin more regularly with the aspirin   Lab Results  Component Value Date   CHOL 152 08/12/2017   HDL 55.40 08/12/2017   LDLCALC 84 08/12/2017   LDLDIRECT 79.0 01/11/2015   TRIG 62.0 08/12/2017   CHOLHDL 3 08/12/2017   She has had atrial arrhythmias treated with metoprolol    BP 136/76   Pulse 70   Ht 5' 9.5" (1.765 m)   Wt 202 lb 6.4 oz (91.8 kg)   SpO2 97%   BMI 29.46 kg/m    ASSESSMENT/PLAN:  DIABETES type 1 with fair control, A1c last was 7.5  See history of present illness for detailed discussion of her current blood sugar patterns and management with her pump and Review of her home continuous glucose monitoring   Her blood  sugars averaging about her and 80 on his sensor for the last 2 weeks She has fairly consistent hyperglycemic patterns overnight, after lunch and sometimes after evening meal Although some of these high readings are related to missing boluses at lunchtime she seems to be requiring higher amounts of insulin to cover her lunch Also she is not covering her higher fat meals adequately especially on weekends when she is going out Currently without exercise also her blood sugars maybe higher Also seems to be  requiring higher BASAL rate overnight particularly after 2 AM   Recommendations:   Advised her to add 30-50% more insulin for higher fat meals and extended boluses, this would be generally at least 10-15 g of carbohydrates at her evening meal instead of 5-10  Carbohydrate coverage 1: 12 at lunchtime and unchanged at suppertime  Basal rate changes: 2 AM = 1.1 and 10 PM = 1.6, both increased  Call if having hypoglycemia with these changes or consistently high readings  Correction factor 1: 40 at midnight for overnight high readings  Continue 1:30 after 6 PM  Start regular walking when able to  Continue Actos and metformin for insulin resistance, however she can try to take 3 metformins daily with using 2 in the evening instead of 2 in the morning when she is not always eating breakfast, she will let us know if she is having any tendency to diarrhea with this  HYPERLIPIDEMIA: Her LDL is doing better with her niacin compliance  Counseling time on subjects discussed in assessment and plan sections is over 50% of today's 25 minute visit   Elayne Snare 10/17/17   Note: This office note was prepared with Dragon voice recognition system technology. Any transcriptional errors that result from this process are unintentional.

## 2017-10-23 DIAGNOSIS — S82822D Torus fracture of lower end of left fibula, subsequent encounter for fracture with routine healing: Secondary | ICD-10-CM | POA: Diagnosis not present

## 2017-10-29 DIAGNOSIS — I499 Cardiac arrhythmia, unspecified: Secondary | ICD-10-CM | POA: Insufficient documentation

## 2017-10-29 DIAGNOSIS — Z9641 Presence of insulin pump (external) (internal): Secondary | ICD-10-CM | POA: Insufficient documentation

## 2017-10-29 DIAGNOSIS — I38 Endocarditis, valve unspecified: Secondary | ICD-10-CM | POA: Insufficient documentation

## 2017-10-29 DIAGNOSIS — D219 Benign neoplasm of connective and other soft tissue, unspecified: Secondary | ICD-10-CM | POA: Insufficient documentation

## 2017-10-29 DIAGNOSIS — E109 Type 1 diabetes mellitus without complications: Secondary | ICD-10-CM | POA: Insufficient documentation

## 2017-10-29 DIAGNOSIS — M1990 Unspecified osteoarthritis, unspecified site: Secondary | ICD-10-CM | POA: Insufficient documentation

## 2017-11-13 ENCOUNTER — Ambulatory Visit (INDEPENDENT_AMBULATORY_CARE_PROVIDER_SITE_OTHER): Payer: Medicare Other | Admitting: Internal Medicine

## 2017-11-13 ENCOUNTER — Encounter: Payer: Self-pay | Admitting: Internal Medicine

## 2017-11-13 DIAGNOSIS — M79661 Pain in right lower leg: Secondary | ICD-10-CM | POA: Diagnosis not present

## 2017-11-13 NOTE — Progress Notes (Signed)
   Subjective:    Patient ID: [name], female    DOB: April 24, 1946, 72 y.o.   MRN: [id number]  HPI The patient is a 72 YO female coming in for wound check after a fall. Since she is a diabetic she wanted to make sure it is healing. She fell off a treadmill. She turned it up to medium as she thought that this would be slow enough for her. She could not keep up and banged her right shin and left knee on the treadmill. Denies hitting her head or LOC. She was able to walk afterwards and did not seek immediate care. These have been kept clean and are scabbing. She can concerned about some redness around 1 area so made a visit.   Review of Systems  Constitutional: Positive for activity change. Negative for appetite change and chills.  Respiratory: Negative for cough, chest tightness and shortness of breath.   Cardiovascular: Negative for chest pain, palpitations and leg swelling.  Gastrointestinal: Negative for abdominal distention, abdominal pain, constipation, diarrhea, nausea and vomiting.  Musculoskeletal: Negative.   Skin: Positive for wound.  Neurological: Negative.       Objective:   Physical Exam  Constitutional: She is oriented to person, place, and time. She appears well-developed and well-nourished.  HENT:  Head: Normocephalic and atraumatic.  Eyes: EOM are normal.  Neck: Normal range of motion.  Cardiovascular: Normal rate and regular rhythm.  Pulmonary/Chest: Effort normal and breath sounds normal. No respiratory distress. She has no wheezes. She has no rales.  Abdominal: Soft.  Musculoskeletal: She exhibits no edema.  Neurological: She is alert and oriented to person, place, and time. Coordination normal.  Skin: Skin is warm and dry.  Some scabbing on the right shin with mild redness surrounding, appears to be healing, no heat or swelling, left knee with mild scabbing appears to be healing no redness   Vitals:   11/13/17 1321  BP: 118/70  Pulse: 69  Temp: 97.7 F (36.5  C)  TempSrc: Oral  SpO2: 99%  Weight: 206 lb (93.4 kg)  Height: 5' 9.5" (1.765 m)      Assessment & Plan:

## 2017-11-14 DIAGNOSIS — M79661 Pain in right lower leg: Secondary | ICD-10-CM | POA: Insufficient documentation

## 2017-11-14 NOTE — Assessment & Plan Note (Signed)
Wound healing appropriately without signs of infection. Continue to use antibiotic ointment on the area and keep covered for protection. Encouraged to keep speed on low for treadmill.

## 2017-11-17 ENCOUNTER — Encounter: Payer: Self-pay | Admitting: Cardiovascular Disease

## 2017-11-17 ENCOUNTER — Ambulatory Visit (INDEPENDENT_AMBULATORY_CARE_PROVIDER_SITE_OTHER): Payer: Medicare Other | Admitting: Cardiovascular Disease

## 2017-11-17 VITALS — BP 146/70 | HR 64 | Ht 69.0 in | Wt 203.2 lb

## 2017-11-17 DIAGNOSIS — I2510 Atherosclerotic heart disease of native coronary artery without angina pectoris: Secondary | ICD-10-CM

## 2017-11-17 NOTE — Progress Notes (Signed)
[name] Date of Birth  1946/06/17 [place] [address]    [place]   [place], [place]  [address]    [place],   [address] [phone]  Fax  [phone]  [phone]  Fax [phone]   Problems. 1. CAD 2. Atrial Flutter - s/p Aflutter ablation Nov. 2014.  3. Diabetes Mellitus.   History of Present Illness:  [name] is doing very well. She's not having episodes of chest pain or shortness of breath. She's been able to below of her normal activities without any significant problems. She has not had chest pains .  Her insurance will run out the month before she goes on Medicare.   She has not had any CP and no palpitations.    April 08, 2013:   [name] is doing well. No CP.  Rhythm is stable.     Mar 11, 2014:  [name] is doing well.  She had a atrial flutter ablation several months ago.  She has not been taking her metoprolol..  Nov. 9, 2015:  [name] is doing well. No Cp , no dyspnea.  Active, not exerciseing as much as she would like.  Mar 16, 2015:   [name] is doing well.   Doing well from a cardiac standpoint.   Nov. 10, 2016  Lots of stress - her 45 yo mom is living with her.  Will not let anyone else do anything Lots of stress No CP , no arrhythmias No time to exercise   Mar 20, 2016:  Doing well Her mother is living back in [place].   Stress is a bit less.  Exercising some .   Nov. 22, 2017.  Doing well . Had afib ablation several years ago and feels great.  DM is well controlled.  Has UTIs frequently .   November 17, 2016: Seen back today for follow-up of her coronary artery disease. Doing well Falls occasionally ,  Broke her left kneecap and left ankle this past year.  Takes the niacin at night and the ASA 81 mg does not conrol the niacin flushng like the 325 mg niacin does.      Current Outpatient Medications on File Prior to Visit  Medication Sig Dispense Refill  . aspirin  325 MG EC tablet Take 325 mg by mouth daily.    . Calcium Carbonate-Vitamin D (CALCIUM + D PO) Take 600 mg by mouth 2 (two) times daily.     . cholecalciferol (VITAMIN D) 1000 units tablet Take 1,000 Units by mouth 2 (two) times daily.     . clopidogrel (PLAVIX) 75 MG tablet TAKE 1 TABLET DAILY 90 tablet 0  . glucose blood (BAYER CONTOUR TEST) test strip Use as instructed to check sugar 6 times daily. Dx Code E10.65 550 each 3  . Insulin Aspart (INSULIN PUMP) 100 unit/ml SOLN Inject into the skin. Novolog Vial, max 200 units per day with pump    . insulin aspart (NOVOLOG) 100 UNIT/ML injection USE A MAX OF 200 UNITS PER DAY VIA INSULIN PUMP 90 mL 1  . metFORMIN (GLUCOPHAGE) 500 MG tablet Take 500 mg by mouth 3 (three) times daily. 1 tablet in the am and 2 at dinner    . metoprolol succinate (TOPROL-XL) 100 MG 24 hr tablet TAKE 1 TABLET (100 MG TOTAL) BY MOUTH DAILY. 90 tablet 2  . niacin (NIASPAN) 1000 MG CR tablet Take 1 tablet by mouth at  bedtime 90 tablet 1  . nitroGLYCERIN (NITROSTAT) 0.4 MG SL tablet Place 1 tablet (0.4 mg total) under the tongue every 5 (five) minutes as needed for chest pain. 25 tablet 1  . pioglitazone (ACTOS) 15 MG tablet Take 1 tablet (15 mg total) by mouth daily. 90 tablet 1  . rosuvastatin (CRESTOR) 40 MG tablet Take 1 tablet (40 mg total) by mouth daily. 90 tablet 1  . traMADol (ULTRAM) 50 MG tablet Take 1 tablet (50 mg total) by mouth every 6 (six) hours as needed for severe pain. 15 tablet 0   No current facility-administered medications on file prior to visit.     Allergies  Allergen Reactions  . Sulfa Antibiotics Rash    Past Medical History:  Diagnosis Date  . Arthritis    OA  . Atrial flutter ([place])    a. s/p TEE/DCCV 08/16/13 (normal LV function, no LAA thrombus).b. s/p ablation by Dr Lovena Le 09-23-2013  . Coronary artery disease    2009 LAD Promus stent  . Dysrhythmia    Aflutter, no issues after ablation 2014  . Fibroid   . Hyperlipidemia   .  Insulin pump in place   . Osteopenia   . Type 1 diabetes mellitus on insulin therapy ([place])   . Valvular heart disease    a. Mild MR/TR by TEE 08/2013.    Past Surgical History:  Procedure Laterality Date  . ABLATION  09-23-2013   CTI by Dr Lovena Le  . APPENDECTOMY    . APPLICATION OF WOUND VAC Left 04/11/2017   Procedure: APPLICATION OF WOUND VAC LEFT KNEE;  Surgeon: Rod Can, MD;  Location: [place];  Service: Orthopedics;  Laterality: Left;  . ATRIAL FLUTTER ABLATION N/A 09/23/2013   Procedure: ATRIAL FLUTTER ABLATION;  Surgeon: Evans Lance, MD;  Location: [facility] CATH LAB;  Service: Cardiovascular;  Laterality: N/A;  . CARDIOVERSION N/A 08/16/2013   Procedure: CARDIOVERSION;  Surgeon: Thayer Headings, MD;  Location: [place];  Service: Cardiovascular;  Laterality: N/A;  spoke with [name]  . CATARACT EXTRACTION Bilateral   . COLONOSCOPY    . CORONARY ANGIOPLASTY WITH STENT PLACEMENT    . MYOMECTOMY  1977  . ORIF PATELLA Left 04/11/2017  . ORIF PATELLA Left 04/11/2017   Procedure: OPEN REDUCTION INTERNAL (ORIF) FIXATION PATELLA LEFT KNEE;  Surgeon: Rod Can, MD;  Location: [place];  Service: Orthopedics;  Laterality: Left;  . PELVIC LAPAROSCOPY    . TEE WITHOUT CARDIOVERSION N/A 08/16/2013   Procedure: TRANSESOPHAGEAL ECHOCARDIOGRAM (TEE);  Surgeon: Thayer Headings, MD;  Location: [facility] ENDOSCOPY;  Service: Cardiovascular;  Laterality: N/A;    Social History   Tobacco Use  Smoking Status Never Smoker  Smokeless Tobacco Never Used    Social History   Substance and Sexual Activity  Alcohol Use Yes  . Alcohol/week: 3.0 oz  . Types: 5 Glasses of wine per week   Comment: on weekends    Family History  Problem Relation Age of Onset  . CAD Father 87       died age 44  . CAD Brother 79       small vessel disease  . Atrial fibrillation Mother   . Diabetes Mother   . Hypertension Mother   . CAD Cousin        paternal  . CAD Paternal Grandfather        early onset  .  Colon cancer Neg Hx     Reviw of Systems:  Reviewed in the HPI.  All other systems are negative.  Physical Exam: Blood pressure (!) 146/70, pulse 64, height 5\' 9"  (1.753 m), weight 203 lb 3.2 oz (92.2 kg), SpO2 97 %.  GEN:  Well nourished, well developed in no acute distress HEENT: Normal NECK: No JVD; No carotid bruits LYMPHATICS: No lymphadenopathy CARDIAC: RR, no murmurs, rubs, gallops RESPIRATORY:  Clear to auscultation without rales, wheezing or rhonchi  ABDOMEN: Soft, non-tender, non-distended MUSCULOSKELETAL:  No edema; No deformity  SKIN: Warm and dry NEUROLOGIC:  Alert and oriented x 3   ECG: November 17, 2017: Normal sinus rhythm at 66.  Occasional premature ventricular contractions  Assessment / Plan:   1. CAD - doing well.  No angina . Continue current meds  2. Atrial Flutter - s/p Aflutter ablation Nov. 2014.  No evidence of recurrence.    3. Diabetes Mellitus.  4. Hperlipidemia:  Continue exercise program     Mertie Moores, MD  11/17/2017 9:22 AM    [facility] [address] [address],  [place], [place]  [address] Pager [phone] Phone: [phone]; Fax: [phone]

## 2017-11-17 NOTE — Patient Instructions (Signed)

## 2017-11-18 DIAGNOSIS — M25572 Pain in left ankle and joints of left foot: Secondary | ICD-10-CM | POA: Diagnosis not present

## 2017-11-18 DIAGNOSIS — S8265XD Nondisplaced fracture of lateral malleolus of left fibula, subsequent encounter for closed fracture with routine healing: Secondary | ICD-10-CM | POA: Diagnosis not present

## 2018-01-05 ENCOUNTER — Ambulatory Visit: Payer: Medicare Other | Admitting: Internal Medicine

## 2018-01-06 ENCOUNTER — Other Ambulatory Visit: Payer: Self-pay | Admitting: *Deleted

## 2018-01-06 ENCOUNTER — Telehealth: Payer: Self-pay | Admitting: Endocrinology

## 2018-01-06 MED ORDER — INSULIN ASPART 100 UNIT/ML ~~LOC~~ SOLN: SUBCUTANEOUS | 1 refills | Status: DC

## 2018-01-06 MED ORDER — ROSUVASTATIN CALCIUM 40 MG PO TABS: 40.0000 mg | ORAL_TABLET | Freq: Every day | ORAL | 1 refills | Status: DC

## 2018-01-06 MED ORDER — METOPROLOL SUCCINATE ER 100 MG PO TB24: ORAL_TABLET | ORAL | 3 refills | Status: DC

## 2018-01-06 MED ORDER — NIACIN ER (ANTIHYPERLIPIDEMIC) 1000 MG PO TBCR: EXTENDED_RELEASE_TABLET | ORAL | 1 refills | Status: DC

## 2018-01-06 MED ORDER — CLOPIDOGREL BISULFATE 75 MG PO TABS: 75.0000 mg | ORAL_TABLET | Freq: Every day | ORAL | 1 refills | Status: DC

## 2018-01-06 MED ORDER — METFORMIN HCL 500 MG PO TABS: 500.0000 mg | ORAL_TABLET | Freq: Three times a day (TID) | ORAL | 0 refills | Status: DC

## 2018-01-06 MED ORDER — PIOGLITAZONE HCL 15 MG PO TABS: 15.0000 mg | ORAL_TABLET | Freq: Every day | ORAL | 1 refills | Status: DC

## 2018-01-06 NOTE — Telephone Encounter (Signed)
Refill for  metFORMIN (GLUCOPHAGE) 500 MG tablet [[id number]]  clopidogrel (PLAVIX) 75 MG tablet [[id number]]  rosuvastatin (CRESTOR) 40 MG tablet [[id number]]  insulin aspart (NOVOLOG) 100 UNIT/ML injection [[id number]]  (90 day supply)  [facility] [place]. Provider [phone] customer care service#  [phone]      Refill for   niacin (NIASPAN) 1000 MG CR tablet [[id number]]  pioglitazone (ACTOS) 15 MG tablet [[id number]]  (Monthly refills)  [id number] [place] [place] DEA #:  [id number]

## 2018-01-06 NOTE — Telephone Encounter (Signed)
Refills sent. See meds.  

## 2018-01-12 ENCOUNTER — Other Ambulatory Visit (INDEPENDENT_AMBULATORY_CARE_PROVIDER_SITE_OTHER): Payer: Medicare Other

## 2018-01-12 DIAGNOSIS — E1065 Type 1 diabetes mellitus with hyperglycemia: Secondary | ICD-10-CM

## 2018-01-12 LAB — COMPREHENSIVE METABOLIC PANEL
ALT: 25 U/L (ref 0–35)
AST: 22 U/L (ref 0–37)
Albumin: 3.7 g/dL (ref 3.5–5.2)
Alkaline Phosphatase: 98 U/L (ref 39–117)
BUN: 15 mg/dL (ref 6–23)
CO2: 27 meq/L (ref 19–32)
CREATININE: 0.75 mg/dL (ref 0.40–1.20)
Calcium: 9.3 mg/dL (ref 8.4–10.5)
Chloride: 104 mEq/L (ref 96–112)
GFR: 81.18 mL/min (ref >60.00)
GLUCOSE: 208 mg/dL — AB (ref 70–99)
Potassium: 4.5 mEq/L (ref 3.5–5.1)
SODIUM: 137 meq/L (ref 135–145)
Total Bilirubin: 0.4 mg/dL (ref 0.2–1.2)
Total Protein: 6.6 g/dL (ref 6.0–8.3)

## 2018-01-12 LAB — LIPID PANEL
CHOLESTEROL: 158 mg/dL (ref 0–200)
HDL: 61.4 mg/dL (ref >39.00)
LDL Cholesterol: 82 mg/dL (ref 0–99)
NonHDL: 96.3
Total CHOL/HDL Ratio: 3
Triglycerides: 72 mg/dL (ref 0.0–149.0)
VLDL: 14.4 mg/dL (ref 0.0–40.0)

## 2018-01-12 LAB — MICROALBUMIN / CREATININE URINE RATIO
CREATININE, U: 63 mg/dL
MICROALB/CREAT RATIO: 1.1 mg/g (ref 0.0–30.0)

## 2018-01-12 LAB — HEMOGLOBIN A1C: Hgb A1c MFr Bld: 7.7 % — ABNORMAL HIGH (ref 4.6–6.5)

## 2018-01-13 ENCOUNTER — Encounter: Payer: Self-pay | Admitting: Internal Medicine

## 2018-01-13 ENCOUNTER — Ambulatory Visit (INDEPENDENT_AMBULATORY_CARE_PROVIDER_SITE_OTHER): Payer: Medicare Other | Admitting: Internal Medicine

## 2018-01-13 VITALS — BP 130/76 | HR 80 | Temp 97.6°F | Ht 69.0 in | Wt 204.0 lb

## 2018-01-13 DIAGNOSIS — E785 Hyperlipidemia, unspecified: Secondary | ICD-10-CM | POA: Diagnosis not present

## 2018-01-13 DIAGNOSIS — E1065 Type 1 diabetes mellitus with hyperglycemia: Secondary | ICD-10-CM | POA: Diagnosis not present

## 2018-01-13 DIAGNOSIS — Z0000 Encounter for general adult medical examination without abnormal findings: Secondary | ICD-10-CM | POA: Diagnosis not present

## 2018-01-13 DIAGNOSIS — E1169 Type 2 diabetes mellitus with other specified complication: Secondary | ICD-10-CM

## 2018-01-13 DIAGNOSIS — I483 Typical atrial flutter: Secondary | ICD-10-CM | POA: Diagnosis not present

## 2018-01-13 MED ORDER — ZOSTER VAC RECOMB ADJUVANTED 50 MCG/0.5ML IM SUSR: 0.5000 mL | Freq: Once | INTRAMUSCULAR | 1 refills | Status: AC

## 2018-01-13 NOTE — Patient Instructions (Addendum)
We have given you the shingles prescription. Get the first one then at least 2 months later (not more than 6 months later) get the second shot. If you have a hard time getting it try [facility] as [facility] may have it.   Health Maintenance, Female Adopting a healthy lifestyle and getting preventive care can go a long way to promote health and wellness. Talk with your health care provider about what schedule of regular examinations is right for you. This is a good chance for you to check in with your provider about disease prevention and staying healthy. In between checkups, there are plenty of things you can do on your own. Experts have done a lot of research about which lifestyle changes and preventive measures are most likely to keep you healthy. Ask your health care provider for more information. Weight and diet Eat a healthy diet  Be sure to include plenty of vegetables, fruits, low-fat dairy products, and lean protein.  Do not eat a lot of foods high in solid fats, added sugars, or salt.  Get regular exercise. This is one of the most important things you can do for your health. ? Most adults should exercise for at least 150 minutes each week. The exercise should increase your heart rate and make you sweat (moderate-intensity exercise). ? Most adults should also do strengthening exercises at least twice a week. This is in addition to the moderate-intensity exercise.  Maintain a healthy weight  Body mass index (BMI) is a measurement that can be used to identify possible weight problems. It estimates body fat based on height and weight. Your health care provider can help determine your BMI and help you achieve or maintain a healthy weight.  For females 24 years of age and older: ? A BMI below 18.5 is considered underweight. ? A BMI of 18.5 to 24.9 is normal. ? A BMI of 25 to 29.9 is considered overweight. ? A BMI of 30 and above is considered obese.  Watch levels of  cholesterol and blood lipids  You should start having your blood tested for lipids and cholesterol at 70 years of age, then have this test every 5 years.  You may need to have your cholesterol levels checked more often if: ? Your lipid or cholesterol levels are high. ? You are older than 70 years of age. ? You are at high risk for heart disease.  Cancer screening Lung Cancer  Lung cancer screening is recommended for adults 4-89 years old who are at high risk for lung cancer because of a history of smoking.  A yearly low-dose CT scan of the lungs is recommended for people who: ? Currently smoke. ? Have quit within the past 15 years. ? Have at least a 30-pack-year history of smoking. A pack year is smoking an average of one pack of cigarettes a day for 1 year.  Yearly screening should continue until it has been 15 years since you quit.  Yearly screening should stop if you develop a health problem that would prevent you from having lung cancer treatment.  Breast Cancer  Practice breast self-awareness. This means understanding how your breasts normally appear and feel.  It also means doing regular breast self-exams. Let your health care provider know about any changes, no matter how small.  If you are in your 20s or 30s, you should have a clinical breast exam (CBE) by a health care provider every 1-3 years as part of a regular health exam.  If you are 40 or older, have a CBE every year. Also consider having a breast X-ray (mammogram) every year.  If you have a family history of breast cancer, talk to your health care provider about genetic screening.  If you are at high risk for breast cancer, talk to your health care provider about having an MRI and a mammogram every year.  Breast cancer gene (BRCA) assessment is recommended for women who have family members with BRCA-related cancers. BRCA-related cancers include: ? Breast. ? Ovarian. ? Tubal. ? Peritoneal cancers.  Results  of the assessment will determine the need for genetic counseling and BRCA1 and BRCA2 testing.  Cervical Cancer Your health care provider may recommend that you be screened regularly for cancer of the pelvic organs (ovaries, uterus, and vagina). This screening involves a pelvic examination, including checking for microscopic changes to the surface of your cervix (Pap test). You may be encouraged to have this screening done every 3 years, beginning at age 21.  For women ages 30-65, health care providers may recommend pelvic exams and Pap testing every 3 years, or they may recommend the Pap and pelvic exam, combined with testing for human papilloma virus (HPV), every 5 years. Some types of HPV increase your risk of cervical cancer. Testing for HPV may also be done on women of any age with unclear Pap test results.  Other health care providers may not recommend any screening for nonpregnant women who are considered low risk for pelvic cancer and who do not have symptoms. Ask your health care provider if a screening pelvic exam is right for you.  If you have had past treatment for cervical cancer or a condition that could lead to cancer, you need Pap tests and screening for cancer for at least 20 years after your treatment. If Pap tests have been discontinued, your risk factors (such as having a new sexual partner) need to be reassessed to determine if screening should resume. Some women have medical problems that increase the chance of getting cervical cancer. In these cases, your health care provider may recommend more frequent screening and Pap tests.  Colorectal Cancer  This type of cancer can be detected and often prevented.  Routine colorectal cancer screening usually begins at 70 years of age and continues through 70 years of age.  Your health care provider may recommend screening at an earlier age if you have risk factors for colon cancer.  Your health care provider may also recommend using  home test kits to check for hidden blood in the stool.  A small camera at the end of a tube can be used to examine your colon directly (sigmoidoscopy or colonoscopy). This is done to check for the earliest forms of colorectal cancer.  Routine screening usually begins at age 50.  Direct examination of the colon should be repeated every 5-10 years through 70 years of age. However, you may need to be screened more often if early forms of precancerous polyps or small growths are found.  Skin Cancer  Check your skin from head to toe regularly.  Tell your health care provider about any new moles or changes in moles, especially if there is a change in a mole's shape or color.  Also tell your health care provider if you have a mole that is larger than the size of a pencil eraser.  Always use sunscreen. Apply sunscreen liberally and repeatedly throughout the day.  Protect yourself by wearing long sleeves, pants, a wide-brimmed hat, and   sunglasses whenever you are outside.  Heart disease, diabetes, and high blood pressure  High blood pressure causes heart disease and increases the risk of stroke. High blood pressure is more likely to develop in: ? People who have blood pressure in the high end of the normal range (130-139/85-89 mm Hg). ? People who are overweight or obese. ? People who are African American.  If you are 44-32 years of age, have your blood pressure checked every 3-5 years. If you are 62 years of age or older, have your blood pressure checked every year. You should have your blood pressure measured twice-once when you are at a hospital or clinic, and once when you are not at a hospital or clinic. Record the average of the two measurements. To check your blood pressure when you are not at a hospital or clinic, you can use: ? An automated blood pressure machine at a pharmacy. ? A home blood pressure monitor.  If you are between 71 years and 6 years old, ask your health care provider  if you should take aspirin to prevent strokes.  Have regular diabetes screenings. This involves taking a blood sample to check your fasting blood sugar level. ? If you are at a normal weight and have a low risk for diabetes, have this test once every three years after 71 years of age. ? If you are overweight and have a high risk for diabetes, consider being tested at a younger age or more often. Preventing infection Hepatitis B  If you have a higher risk for hepatitis B, you should be screened for this virus. You are considered at high risk for hepatitis B if: ? You were born in a country where hepatitis B is common. Ask your health care provider which countries are considered high risk. ? Your parents were born in a high-risk country, and you have not been immunized against hepatitis B (hepatitis B vaccine). ? You have HIV or AIDS. ? You use needles to inject street drugs. ? You live with someone who has hepatitis B. ? You have had sex with someone who has hepatitis B. ? You get hemodialysis treatment. ? You take certain medicines for conditions, including cancer, organ transplantation, and autoimmune conditions.  Hepatitis C  Blood testing is recommended for: ? Everyone born from [birth year] through 1963. ? Anyone with known risk factors for hepatitis C.  Sexually transmitted infections (STIs)  You should be screened for sexually transmitted infections (STIs) including gonorrhea and chlamydia if: ? You are sexually active and are younger than 71 years of age. ? You are older than 70 years of age and your health care provider tells you that you are at risk for this type of infection. ? Your sexual activity has changed since you were last screened and you are at an increased risk for chlamydia or gonorrhea. Ask your health care provider if you are at risk.  If you do not have HIV, but are at risk, it may be recommended that you take a prescription medicine daily to prevent HIV infection. This  is called pre-exposure prophylaxis (PrEP). You are considered at risk if: ? You are sexually active and do not regularly use condoms or know the HIV status of your partner(s). ? You take drugs by injection. ? You are sexually active with a partner who has HIV.  Talk with your health care provider about whether you are at high risk of being infected with HIV. If you choose to begin PrEP, you  should first be tested for HIV. You should then be tested every 3 months for as long as you are taking PrEP. Pregnancy  If you are premenopausal and you may become pregnant, ask your health care provider about preconception counseling.  If you may become pregnant, take 400 to 800 micrograms (mcg) of folic acid every day.  If you want to prevent pregnancy, talk to your health care provider about birth control (contraception). Osteoporosis and menopause  Osteoporosis is a disease in which the bones lose minerals and strength with aging. This can result in serious bone fractures. Your risk for osteoporosis can be identified using a bone density scan.  If you are 73 years of age or older, or if you are at risk for osteoporosis and fractures, ask your health care provider if you should be screened.  Ask your health care provider whether you should take a calcium or vitamin D supplement to lower your risk for osteoporosis.  Menopause may have certain physical symptoms and risks.  Hormone replacement therapy may reduce some of these symptoms and risks. Talk to your health care provider about whether hormone replacement therapy is right for you. Follow these instructions at home:  Schedule regular health, dental, and eye exams.  Stay current with your immunizations.  Do not use any tobacco products including cigarettes, chewing tobacco, or electronic cigarettes.  If you are pregnant, do not drink alcohol.  If you are breastfeeding, limit how much and how often you drink alcohol.  Limit alcohol intake  to no more than 1 drink per day for nonpregnant women. One drink equals 12 ounces of beer, 5 ounces of wine, or 1 ounces of hard liquor.  Do not use street drugs.  Do not share needles.  Ask your health care provider for help if you need support or information about quitting drugs.  Tell your health care provider if you often feel depressed.  Tell your health care provider if you have ever been abused or do not feel safe at home. This information is not intended to replace advice given to you by your health care provider. Make sure you discuss any questions you have with your health care provider. Document Released: 05/06/2011 Document Revised: 03/28/2016 Document Reviewed: 07/25/2015 Elsevier Interactive Patient Education  [employer].

## 2018-01-13 NOTE — Assessment & Plan Note (Signed)
Foot exam done today, seeing endo for chronic management. HgA1c 7.7 and reviewed with her at visit. She has upcoming apt with endo for management of pump settings. No longer getting low sugars but is having some times where the pump gets disconnected or she forgets to resume after shower and sugars go high then to 300+.

## 2018-01-13 NOTE — Assessment & Plan Note (Signed)
Given rx for shingrix. Mammogram and colonoscopy up to date. Pneumonia and flu and tetanus up to date. Dexa up to date. Counseled about sun safety and hearing protection as well as dangers of distracted driving. Given 10 year screening recommendations.

## 2018-01-13 NOTE — Progress Notes (Signed)
   Subjective:    Patient ID: [name], female    DOB: [birth year], 72 y.o.   MRN: [id number]  HPI Here for medicare wellness, no new complaints. Please see A/P for status and treatment of chronic medical problems.   HPI #2: Follow up diabetes (needs foot exam, using insulin pump and seeing endo, denies new neuropathy, complicated by high sugars recently better, denies low sugars since pump settings adjusted), and her a flutter (taking metoprolol, bp at goal, denies headaches or chest pains or abdominal pain, denies side effects, missed dose one day and got severe palpitations, prior a flutter), and her cholesterol (taking crestor daily, denies missing many doses except rare, denies stroke or heart attack symptoms, known CAD, goal LDL <100).   Diet: DM since diabetic Physical activity: sedentary Depression/mood screen: negative Hearing: moderate loss but declines hearing exam Visual acuity: grossly normal, performs annual eye exam  ADLs: capable Fall risk: none Home safety: good Cognitive evaluation: intact to orientation, naming, recall and repetition EOL planning: adv directives discussed  I have personally reviewed and have noted 1. The patient's medical and social history - reviewed today no changes 2. Their use of alcohol, tobacco or illicit drugs 3. Their current medications and supplements 4. The patient's functional ability including ADL's, fall risks, home safety risks and hearing or visual impairment. 5. Diet and physical activities 6. Evidence for depression or mood disorders 7. Care team reviewed and updated (available in snapshot)  Review of Systems  Constitutional: Negative.   HENT: Negative.   Eyes: Negative.   Respiratory: Negative for cough, chest tightness and shortness of breath.   Cardiovascular: Negative for chest pain, palpitations and leg swelling.  Gastrointestinal: Negative for abdominal distention, abdominal pain, constipation, diarrhea, nausea and  vomiting.  Musculoskeletal: Negative.   Skin: Negative.   Neurological: Negative.   Psychiatric/Behavioral: Negative.       Objective:   Physical Exam  Constitutional: She is oriented to person, place, and time. She appears well-developed and well-nourished.  HENT:  Head: Normocephalic and atraumatic.  Eyes: EOM are normal.  Neck: Normal range of motion.  Cardiovascular: Normal rate and regular rhythm.  Pulmonary/Chest: Effort normal and breath sounds normal. No respiratory distress. She has no wheezes. She has no rales.  Abdominal: Soft. Bowel sounds are normal. She exhibits no distension. There is no tenderness. There is no rebound.  Musculoskeletal: She exhibits no edema.  Neurological: She is alert and oriented to person, place, and time. Coordination normal.  Skin: Skin is warm and dry.  Foot exam done  Psychiatric: She has a normal mood and affect.   Vitals:   01/13/18 1057  BP: 130/76  Pulse: 80  Temp: 97.6 F (36.4 C)  TempSrc: Oral  SpO2: 97%  Weight: 204 lb (92.5 kg)  Height: 5\' 9"  (1.753 m)      Assessment & Plan:

## 2018-01-13 NOTE — Assessment & Plan Note (Signed)
Taking metoprolol for suppression and had severe palpitations without medication for 1 day. She needs to continue metoprolol. Recent CMP reviewed with the patient.

## 2018-01-13 NOTE — Assessment & Plan Note (Signed)
Taking crestor and last LDL at goal and reviewed with patient at the visit.

## 2018-01-15 ENCOUNTER — Encounter: Payer: Self-pay | Admitting: Endocrinology

## 2018-01-15 ENCOUNTER — Ambulatory Visit (INDEPENDENT_AMBULATORY_CARE_PROVIDER_SITE_OTHER): Payer: Medicare Other | Admitting: Endocrinology

## 2018-01-15 VITALS — BP 120/72 | HR 69 | Wt 207.0 lb

## 2018-01-15 DIAGNOSIS — E782 Mixed hyperlipidemia: Secondary | ICD-10-CM

## 2018-01-15 DIAGNOSIS — E1065 Type 1 diabetes mellitus with hyperglycemia: Secondary | ICD-10-CM | POA: Diagnosis not present

## 2018-01-15 DIAGNOSIS — Z8679 Personal history of other diseases of the circulatory system: Secondary | ICD-10-CM

## 2018-01-15 DIAGNOSIS — I2510 Atherosclerotic heart disease of native coronary artery without angina pectoris: Secondary | ICD-10-CM | POA: Diagnosis not present

## 2018-01-15 MED ORDER — EZETIMIBE 10 MG PO TABS: 10.0000 mg | ORAL_TABLET | Freq: Every day | ORAL | 1 refills | Status: DC

## 2018-01-15 NOTE — Progress Notes (Signed)
Patient ID: [name], female   DOB: 11-22-1945, 72 y.o.   MRN: [id number]   Reason for visit: DIABETES followup.  Diagnosis: Type 1 diabetes mellitus, date of diagnosis: 1979.    HISTORY of present illness:   Insulin Pump: CURRENT brand: [id number]  The pump settings are: Midnight = 0.9,  2 AM  0.8, 5 AM = 1.9, 8 AM = 1.7, 11 AM = 1.0; 5 PM = 2.1, 7 PM = 2.6 and 10 PM = 1.7.   Total daily insulin = up to 60 units   Carbohydrate ratio 1:15 breakfast, lunch and 1:7 at dinner; sensitivity 1: 35 until 6 PM and then 1:30; target  110-30  Insulin on board indicator on, duration 4 hrs   DIABETES: She has had long-standing diabetes with A1c levels usually above target. In 10/13 ACTOS was restarted because of her large insulin requirement and diabetic dyslipidemia.  Her blood sugars improved significantly reduced.  Also has been taking metformin as insulin sensitizer  RECENT history:   Her A1c is still relatively higher at 7.7 and gradually increasing, although has been as high as 8.3 before   Current blood sugar patterns from pump and Sensor download and interpretation of her  DexCom CGM download:   Her Sensor has been active for only about 58% of the time, she thinks that it has been out of range sometimes in the evening  Not clear why her blood sugars are fluctuating from day-to-day  However despite her trying to bolus more consistently she appears to be having highest readings in the afternoon after lunch  Also she does not take action and correct the high readings right away in the afternoon   The lunchtime carbohydrate coverage is lower than in the evening  However he things that she usually eating low fat meals more recently  Her blood sugars have not been fully evaluated after dinner on the CGM but had sporadic high readings previously, on her fingersticks blood sugars after evening meal or not consistently high  As before she does not always enter carbohydrates  when she is bolusing and not clear which readings are being covered by carbohydrate coverage or correction or both  She has had less stress but has not been able to start exercise as yet but is planning to  Hypoglycemia has been occasional but may occur in the mornings after doing a correction for high readings during the night  Blood sugar data from fingersticks and CGM:  CGM use %  58  Average and SD  174+/-83  Time in range  54  % Time Above 180  40  % Time above 250  18  % Time Below target  5.7    CGM analysis:  CONTINUOUS GLUCOSE MONITORING RECORD INTERPRETATION    Dates of Recording:   Sensor  summary:   Glycemic patterns: Hyperglycemic episodes have been occurring frequently in the early afternoon sugars are more consistently higher at this time with average blood sugar about 220 at this time Also has very well but persistently high readings late in the evening after about 7 PM Hyperglycemia after evening meal was more prominent in the first week of March   Hypoglycemic episodes occurred on 3 occasions overnight documented on her CGM with one episode lasting mostly between 2 AM and 6 AM; hypoglycemia usually starts after about 1 AM  Overnight periods: Blood sugars are trending downwards from an average of high readings at midnight but subsequently averaging between about  100-130, hypoglycemia occurred as above   Preprandial periods: On an average fasting blood sugars are just above 100 but showing variability Blood sugars are lunchtime are mostly over 180 and evening blood sugars are also similar although showing more variability   Postprandial periods: Hypoglycemia is occurring mostly after lunch and variably after dinner as discussed above   PRE-MEAL Fasting Lunch Dinner Bedtime Overall  Glucose range:  63-3 06   67-377  84-400+ 182+/-72   Mean/median:         Hypoglycemic awareness: Has symptoms of feeling tired, headache, sweaty. Variable recognition threshold  present  Sometimes may not be aware during the night and will be notified by her sensor that the sugar is low  She has symptoms when blood glucose is less than 60. Uses glucose tablets for treatment when not at home but otherwise eats a snack .    DIET has been usually fairly controlled with carbohydrate and fat intake, tends to eat out on weekends 5   Again her mealtimes are variable.  Usually trying to have low fat meals  Food preferences: Yogurt And cottage cheese for breakfast usually.   Physical activity: exercise: None recently  Historically after exercise blood sugar will get lower in 1-2 hrs but sometimes it may get lower while walking and she carries glucose tablets with her. .  Wt Readings from Last 3 Encounters:  01/15/18 207 lb (93.9 kg)  01/13/18 204 lb (92.5 kg)  11/17/17 203 lb 3.2 oz (92.2 kg)    LABS:  Lab Results  Component Value Date   HGBA1C 7.7 (H) 01/12/2018   HGBA1C 7.5 (H) 08/12/2017   HGBA1C 7.1 (H) 05/12/2017   Lab Results  Component Value Date   MICROALBUR <0.7 01/12/2018   LDLCALC 82 01/12/2018   CREATININE 0.75 01/12/2018    Lab on 01/12/2018  Component Date Value Ref Range Status  . Cholesterol 01/12/2018 158  0 - 200 mg/dL Final   ATP III Classification       Desirable:  < 200 mg/dL               Borderline High:  200 - 239 mg/dL          High:  > = 240 mg/dL  . Triglycerides 01/12/2018 72.0  0.0 - 149.0 mg/dL Final   Normal:  <150 mg/dLBorderline High:  150 - 199 mg/dL  . HDL 01/12/2018 61.40  >39.00 mg/dL Final  . VLDL 01/12/2018 14.4  0.0 - 40.0 mg/dL Final  . LDL Cholesterol 01/12/2018 82  0 - 99 mg/dL Final  . Total CHOL/HDL Ratio 01/12/2018 3   Final                  Men          Women1/2 Average Risk     3.4          3.3Average Risk          5.0          4.42X Average Risk          9.6          7.13X Average Risk          15.0          11.0                      . NonHDL 01/12/2018 96.30   Final   NOTE:  Non-HDL goal should be 30  mg/dL  higher than patient's LDL goal (i.e. LDL goal of < 70 mg/dL, would have non-HDL goal of < 100 mg/dL)  . Microalb, Ur 01/12/2018 <0.7  0.0 - 1.9 mg/dL Final  . Creatinine,U 01/12/2018 63.0  mg/dL Final  . Microalb Creat Ratio 01/12/2018 1.1  0.0 - 30.0 mg/g Final  . Sodium 01/12/2018 137  135 - 145 mEq/L Final  . Potassium 01/12/2018 4.5  3.5 - 5.1 mEq/L Final  . Chloride 01/12/2018 104  96 - 112 mEq/L Final  . CO2 01/12/2018 27  19 - 32 mEq/L Final  . Glucose, Bld 01/12/2018 208* 70 - 99 mg/dL Final  . BUN 01/12/2018 15  6 - 23 mg/dL Final  . Creatinine, Ser 01/12/2018 0.75  0.40 - 1.20 mg/dL Final  . Total Bilirubin 01/12/2018 0.4  0.2 - 1.2 mg/dL Final  . Alkaline Phosphatase 01/12/2018 98  39 - 117 U/L Final  . AST 01/12/2018 22  0 - 37 U/L Final  . ALT 01/12/2018 25  0 - 35 U/L Final  . Total Protein 01/12/2018 6.6  6.0 - 8.3 g/dL Final  . Albumin 01/12/2018 3.7  3.5 - 5.2 g/dL Final  . Calcium 01/12/2018 9.3  8.4 - 10.5 mg/dL Final  . GFR 01/12/2018 81.18  >60.00 mL/min Final  . Hgb A1c MFr Bld 01/12/2018 7.7* 4.6 - 6.5 % Final   Glycemic Control Guidelines for People with Diabetes:Non Diabetic:  <6%Goal of Therapy: <7%Additional Action Suggested:  >8%     Allergies as of 01/15/2018      Reactions   Sulfa Antibiotics Rash      Medication List        Accurate as of 01/15/18  8:57 AM. Always use your most recent med list.          aspirin 325 MG EC tablet Take 325 mg by mouth daily.   CALCIUM + D PO Take 600 mg by mouth 2 (two) times daily.   cholecalciferol 1000 units tablet Commonly known as:  VITAMIN D Take 1,000 Units by mouth 2 (two) times daily.   clopidogrel 75 MG tablet Commonly known as:  PLAVIX Take 1 tablet (75 mg total) by mouth daily.   glucose blood test strip Commonly known as:  BAYER CONTOUR TEST Use as instructed to check sugar 6 times daily. Dx Code E10.65   insulin aspart 100 UNIT/ML injection Commonly known as:  NOVOLOG USE A MAX OF  200 UNITS PER DAY VIA INSULIN PUMP   insulin pump Soln Inject into the skin. Novolog Vial, max 200 units per day with pump   metFORMIN 500 MG tablet Commonly known as:  GLUCOPHAGE Take 1 tablet (500 mg total) by mouth 3 (three) times daily. 1 tablet in the am and 2 at dinner   metoprolol succinate 100 MG 24 hr tablet Commonly known as:  TOPROL-XL TAKE 1 TABLET (100 MG TOTAL) BY MOUTH DAILY.   niacin 1000 MG CR tablet Commonly known as:  NIASPAN Take 1 tablet by mouth at  bedtime   nitroGLYCERIN 0.4 MG SL tablet Commonly known as:  NITROSTAT Place 1 tablet (0.4 mg total) under the tongue every 5 (five) minutes as needed for chest pain.   pioglitazone 15 MG tablet Commonly known as:  ACTOS Take 1 tablet (15 mg total) by mouth daily.   rosuvastatin 40 MG tablet Commonly known as:  CRESTOR Take 1 tablet (40 mg total) by mouth daily.   traMADol 50 MG tablet Commonly known as:  ULTRAM Take 1  tablet (50 mg total) by mouth every 6 (six) hours as needed for severe pain.       Allergies:  Allergies  Allergen Reactions  . Sulfa Antibiotics Rash    Past Medical History:  Diagnosis Date  . Arthritis    OA  . Atrial flutter ([place])    a. s/p TEE/DCCV 08/16/13 (normal LV function, no LAA thrombus).b. s/p ablation by Dr Lovena Le 09-23-2013  . Coronary artery disease    2009 LAD Promus stent  . Dysrhythmia    Aflutter, no issues after ablation 2014  . Fibroid   . Hyperlipidemia   . Insulin pump in place   . Osteopenia   . Type 1 diabetes mellitus on insulin therapy ([place])   . Valvular heart disease    a. Mild MR/TR by TEE 08/2013.    Past Surgical History:  Procedure Laterality Date  . ABLATION  09-23-2013   CTI by Dr Lovena Le  . APPENDECTOMY    . APPLICATION OF WOUND VAC Left 04/11/2017   Procedure: APPLICATION OF WOUND VAC LEFT KNEE;  Surgeon: Rod Can, MD;  Location: [place];  Service: Orthopedics;  Laterality: Left;  . ATRIAL FLUTTER ABLATION N/A 09/23/2013    Procedure: ATRIAL FLUTTER ABLATION;  Surgeon: Evans Lance, MD;  Location: [facility] CATH LAB;  Service: Cardiovascular;  Laterality: N/A;  . CARDIOVERSION N/A 08/16/2013   Procedure: CARDIOVERSION;  Surgeon: Thayer Headings, MD;  Location: [place];  Service: Cardiovascular;  Laterality: N/A;  spoke with [name]  . CATARACT EXTRACTION Bilateral   . COLONOSCOPY    . CORONARY ANGIOPLASTY WITH STENT PLACEMENT    . MYOMECTOMY  1977  . ORIF PATELLA Left 04/11/2017  . ORIF PATELLA Left 04/11/2017   Procedure: OPEN REDUCTION INTERNAL (ORIF) FIXATION PATELLA LEFT KNEE;  Surgeon: Rod Can, MD;  Location: [place];  Service: Orthopedics;  Laterality: Left;  . PELVIC LAPAROSCOPY    . TEE WITHOUT CARDIOVERSION N/A 08/16/2013   Procedure: TRANSESOPHAGEAL ECHOCARDIOGRAM (TEE);  Surgeon: Thayer Headings, MD;  Location: [facility] ENDOSCOPY;  Service: Cardiovascular;  Laterality: N/A;    Family History  Problem Relation Age of Onset  . CAD Father 39       died age 46  . CAD Brother 76       small vessel disease  . Atrial fibrillation Mother   . Diabetes Mother   . Hypertension Mother   . CAD Cousin        paternal  . CAD Paternal Grandfather        early onset  . Colon cancer Neg Hx     Social History:  reports that  has never smoked. she has never used smokeless tobacco. She reports that she drinks about 3.0 oz of alcohol per week. She reports that she does not use drugs.  REVIEW of systems:   She has had diabetic dyslipidemia with significantly high LDL particle number and this has been managed with Crestor 40 mg and Niaspan 1000 mg In 3/16 her particle number was below 1000 and Her last level was excellent at 856, no recent labs available  Her lipids again show LDL to be above 70 even though HDL is better She is usually taking her niacin regularly although occasionally may forget at bedtime but quite regular with her Crestor   Lab Results  Component Value Date   CHOL 158 01/12/2018   HDL 61.40  01/12/2018   LDLCALC 82 01/12/2018   LDLDIRECT 79.0 01/11/2015   TRIG 72.0 01/12/2018  CHOLHDL 3 01/12/2018   She has had atrial arrhythmias treated with metoprolol  No recent problems with coronary artery disease   BP 120/72 (BP Location: Left Arm, Patient Position: Sitting, Cuff Size: Normal)   Pulse 69   Wt 207 lb (93.9 kg)   SpO2 98%   BMI 30.57 kg/m    ASSESSMENT/PLAN:  DIABETES type 1 with fair control  Her A1c continues to be gradually increasing and now 7.7  See history of present illness for detailed discussion of her current blood sugar patterns and management with her pump and analysis of her home continuous glucose monitoring   Her blood sugars are tending to be higher in the afternoon and periodically in the evenings and showing variability Also has sporadic significant hypoglycemia during the night also without excessive bolusing at bedtime As discussed above she has difficulty covering her meals consistently and not always entering carbohydrates for what she is eating She probably does need a higher coverage for lunchtime hyperglycemia since the most consistent time when her blood sugars are high Also some of her blood sugar information from the DexCom is missing because of various reasons and only available 58% of the time   Recommendations:  She will reduce her basal rate at night as follows: Midnight = 0.65 until 3 AM 10 PM basal rate  10 PM basal rate is 1.5 and carbohydrate coverage 1: 10 between 11 AM and 5 PM  No other changes as yet  She does need to proactively bolus for blood sugars when they are rising at any given time  She will call if she has another sort of significant overnight hypoglycemia  To change her infusion set every 3-4 days at least  HYPERLIPIDEMIA: Her LDL is now consistently over a target of 70, she has CAD history Zetia will be added and explained to her how this works, she will continue 40 mg of Crestor also We will check  her LDL particle number on the next visit also Continues to use niacin  Counseling time on subjects discussed in assessment and plan sections is over 50% of today's 25 minute visit   Elayne Snare 01/15/18   Note: This office note was prepared with Dragon voice recognition system technology. Any transcriptional errors that result from this process are unintentional.

## 2018-02-06 DIAGNOSIS — Z1231 Encounter for screening mammogram for malignant neoplasm of breast: Secondary | ICD-10-CM | POA: Diagnosis not present

## 2018-02-06 LAB — HM MAMMOGRAPHY

## 2018-02-10 ENCOUNTER — Encounter: Payer: Self-pay | Admitting: Internal Medicine

## 2018-02-13 ENCOUNTER — Encounter (INDEPENDENT_AMBULATORY_CARE_PROVIDER_SITE_OTHER): Payer: Self-pay

## 2018-02-27 ENCOUNTER — Telehealth: Payer: Self-pay | Admitting: Nurse Practitioner

## 2018-02-27 DIAGNOSIS — I2510 Atherosclerotic heart disease of native coronary artery without angina pectoris: Secondary | ICD-10-CM

## 2018-02-27 NOTE — Telephone Encounter (Signed)
Received therapeutic alert documentation from [facility] about patient not taking an ACEI or ARB medication with her hx of diabetes and hypertension. Dr. Acie Fredrickson reviewed the patient's chart and advised she start Losartan 25 mg daily. I called patient and left a message for her to call the office to discuss.

## 2018-03-04 MED ORDER — LOSARTAN POTASSIUM 25 MG PO TABS: 25.0000 mg | ORAL_TABLET | Freq: Every day | ORAL | 3 refills | Status: DC

## 2018-03-04 NOTE — Telephone Encounter (Signed)
Follow Up: ° ° ° ° °Returning your call from last week. °

## 2018-03-04 NOTE — Telephone Encounter (Signed)
Left message for patient to call back  

## 2018-03-04 NOTE — Telephone Encounter (Signed)
Spoke with patient and reviewed Dr. Elmarie Shiley advice to start Losartan 25 mg once daily. She verbalized agreement.  She states she takes Aspirin 325 mg to help with the Niacin per advice from Dr. [name]. I scheduled her for bmet on 5/16 and advised her to call back with questions or concerns. She will monitor BP at home and will call back if needed. She thanked me for the call.

## 2018-03-07 ENCOUNTER — Other Ambulatory Visit: Payer: Self-pay | Admitting: Endocrinology

## 2018-03-19 ENCOUNTER — Encounter: Payer: Self-pay | Admitting: Cardiovascular Disease

## 2018-03-19 ENCOUNTER — Other Ambulatory Visit: Payer: Medicare Other | Admitting: *Deleted

## 2018-03-19 DIAGNOSIS — I2510 Atherosclerotic heart disease of native coronary artery without angina pectoris: Secondary | ICD-10-CM

## 2018-03-19 LAB — BASIC METABOLIC PANEL
BUN/Creatinine Ratio: 21 (ref 12–28)
BUN: 16 mg/dL (ref 8–27)
CALCIUM: 9.5 mg/dL (ref 8.7–10.3)
CHLORIDE: 104 mmol/L (ref 96–106)
CO2: 23 mmol/L (ref 20–29)
Creatinine, Ser: 0.78 mg/dL (ref 0.57–1.00)
GFR, EST AFRICAN AMERICAN: 89 mL/min/{1.73_m2} (ref >59)
GFR, EST NON AFRICAN AMERICAN: 77 mL/min/{1.73_m2} (ref >59)
Glucose: 171 mg/dL — ABNORMAL HIGH (ref 65–99)
POTASSIUM: 4.4 mmol/L (ref 3.5–5.2)
SODIUM: 141 mmol/L (ref 134–144)

## 2018-03-20 ENCOUNTER — Telehealth: Payer: Self-pay | Admitting: Cardiovascular Disease

## 2018-03-20 NOTE — Telephone Encounter (Signed)
Notes recorded by [name], Wonda Cheng, MD on 03/19/2018 at 3:45 PM EDT Glucose is still elevated.  Better than previous but still needs some improvement. BS 171  Left message to c/b for results.

## 2018-03-20 NOTE — Telephone Encounter (Signed)
New message    Patient returning call for lab results   Notes recorded by Tamsen Snider on 03/20/2018 at 11:20 AM EDT Left message on machine for pt to contact the office.

## 2018-03-20 NOTE — Telephone Encounter (Signed)
Reviewed results of lab with pt who states understanding.

## 2018-04-06 ENCOUNTER — Other Ambulatory Visit: Payer: Medicare Other

## 2018-04-07 ENCOUNTER — Other Ambulatory Visit (INDEPENDENT_AMBULATORY_CARE_PROVIDER_SITE_OTHER): Payer: Medicare Other

## 2018-04-07 DIAGNOSIS — Z8679 Personal history of other diseases of the circulatory system: Secondary | ICD-10-CM

## 2018-04-07 DIAGNOSIS — E1065 Type 1 diabetes mellitus with hyperglycemia: Secondary | ICD-10-CM

## 2018-04-07 DIAGNOSIS — E782 Mixed hyperlipidemia: Secondary | ICD-10-CM

## 2018-04-07 LAB — COMPREHENSIVE METABOLIC PANEL
ALBUMIN: 3.7 g/dL (ref 3.5–5.2)
ALT: 47 U/L — ABNORMAL HIGH (ref 0–35)
AST: 48 U/L — AB (ref 0–37)
Alkaline Phosphatase: 96 U/L (ref 39–117)
BUN: 12 mg/dL (ref 6–23)
CHLORIDE: 105 meq/L (ref 96–112)
CO2: 30 mEq/L (ref 19–32)
CREATININE: 0.74 mg/dL (ref 0.40–1.20)
Calcium: 9.2 mg/dL (ref 8.4–10.5)
GFR: 82.39 mL/min (ref >60.00)
GLUCOSE: 146 mg/dL — AB (ref 70–99)
POTASSIUM: 4.9 meq/L (ref 3.5–5.1)
SODIUM: 139 meq/L (ref 135–145)
TOTAL PROTEIN: 6.3 g/dL (ref 6.0–8.3)
Total Bilirubin: 0.4 mg/dL (ref 0.2–1.2)

## 2018-04-07 LAB — LIPID PANEL
CHOLESTEROL: 112 mg/dL (ref 0–200)
HDL: 49 mg/dL (ref >39.00)
LDL Cholesterol: 52 mg/dL (ref 0–99)
NONHDL: 62.88
Total CHOL/HDL Ratio: 2
Triglycerides: 56 mg/dL (ref 0.0–149.0)
VLDL: 11.2 mg/dL (ref 0.0–40.0)

## 2018-04-07 LAB — HEMOGLOBIN A1C: HEMOGLOBIN A1C: 7.8 % — AB (ref 4.6–6.5)

## 2018-04-07 NOTE — Progress Notes (Signed)
Patient ID: [name], female   DOB: 1946-03-17, 72 y.o.   MRN: [id number]   Reason for visit: DIABETES followup.  Diagnosis: Type 1 diabetes mellitus, date of diagnosis: 1979.    HISTORY of present illness:   Insulin Pump: CURRENT brand: 630  The pump settings are: Midnight = 0.9,  2 AM  0.8, 5 AM = 1.9, 8 AM = 1.7, 11 AM = 1.0; 5 PM = 2.1, 7 PM = 2.6 and 10 PM = 1.7.   Total daily insulin = up to 60 units   Carbohydrate ratio 1:15 breakfast, lunch and 1:7 at dinner; sensitivity 1: 35 until 6 PM and then 1:30; target  110-30  Insulin on board indicator on, duration 4 hrs   DIABETES: She has had long-standing diabetes with A1c levels usually above target. In 10/13 ACTOS was restarted because of her large insulin requirement and diabetic dyslipidemia.  Her blood sugars improved significantly reduced.  Also has been taking metformin as insulin sensitizer  RECENT history:   Her A1c is still relatively high at 7.8  Current blood sugar patterns from pump and Sensor download and interpretation of her  DexCom CGM download:   Blood sugar profile review shows blood sugars are fluctuating from day-to-day  Most of her fluctuation appears to be overnight and in late evenings with blood sugars occasionally rising up to 300  Also she does have somewhat less recent blood sugars within the target range compared to last time  Although she has hypoglycemia which may be only on one or 2 night of the last 2 weeks and tend to be relatively prolonged   she tries to treat her low sugars with glucose tablets but does not suspend her pump to allow her blood sugar to recover, has not been aware of this  She may not be covering her meals adequately on some occasions with significantly high readings, usually higher with high fat intake including last night  Also has sporadically forgotten to bolus at meals  She does try to add extra insulin for higher fat content  She does not have a  ready resource to look up her carbohydrates and may just do a web search  She is concerned about difficulty losing weight despite starting some exercise and walking her dogs regularly  Blood sugar data from fingersticks and CGM:  CGM use % of time  70  Average and SD  176+/-81  Time in range    49 versus 54    %  % Time Above 180  43  % Time above 250  17  % Time Below target  7.7    CGM analysis:  CONTINUOUS GLUCOSE MONITORING RECORD INTERPRETATION    Dates of Recording: 5/23 through 04/08/2018  Sensor  summary: Statistics as above  Glycemic patterns: Hyperglycemic episodes have been occurring mostly late in the evening after about 8 PM; these are not necessarily postprandial Also sometimes blood sugars are high for longer periods of time even before evening meal She will have episodes of high blood sugars through the night also but not consistently and also has some days where her blood sugars go up to as high as 250 in the afternoon   Hypoglycemic episodes occurred on 2 nights including last night when hypoglycemia was relatively prolonged starting with a decline around bedtime.  Last episode which occurred last night was unexplained since blood sugar was high for couple hours after dinner and then progressively declined without any correction bolus  Overnight periods: Blood sugars are trending downwards after midnight but frequently may be significantly high, average blood sugar after about 3 AM is about 130.  Has low sugars on 2 occasions as above   Preprandial periods: On an average fasting blood sugars are about 130 but showing variability    Postprandial periods: Blood sugars are quite variable at all times, generally higher after supper but occasionally after lunch, less so after breakfast    PRE-MEAL Fasting Lunch Dinner Bedtime Overall  Glucose range:  62-269      Mean/median:         Hypoglycemic awareness: Has symptoms of feeling tired, headache, sweaty. Variable  recognition threshold present  Sometimes may not be aware during the night and will be notified by her sensor that the sugar is low  She has symptoms when blood glucose is less than 60. Uses glucose tablets for treatment when not at home but otherwise eats a snack .    DIET has been usually fairly controlled with carbohydrate and fat intake, tends to eat out on weekends 5   Again her mealtimes are variable.  Usually trying to have low fat meals  Food preferences: Yogurt And cottage cheese for breakfast usually.   Physical activity: exercise: recently trying to go for exercise at the gym, this does not cause low sugars recently Previously after exercise blood sugar will get lower in 1-2 hrs but sometimes it may get lower while walking and she carries glucose tablets with her. .  Wt Readings from Last 3 Encounters:  04/08/18 209 lb 9.6 oz (95.1 kg)  01/15/18 207 lb (93.9 kg)  01/13/18 204 lb (92.5 kg)    LABS:  Lab Results  Component Value Date   HGBA1C 7.8 (H) 04/07/2018   HGBA1C 7.7 (H) 01/12/2018   HGBA1C 7.5 (H) 08/12/2017   Lab Results  Component Value Date   MICROALBUR <0.7 01/12/2018   LDLCALC 52 04/07/2018   CREATININE 0.74 04/07/2018    Lab on 04/07/2018  Component Date Value Ref Range Status  . Cholesterol 04/07/2018 112  0 - 200 mg/dL Final   ATP III Classification       Desirable:  < 200 mg/dL               Borderline High:  200 - 239 mg/dL          High:  > = 240 mg/dL  . Triglycerides 04/07/2018 56.0  0.0 - 149.0 mg/dL Final   Normal:  <150 mg/dLBorderline High:  150 - 199 mg/dL  . HDL 04/07/2018 49.00  >39.00 mg/dL Final  . VLDL 04/07/2018 11.2  0.0 - 40.0 mg/dL Final  . LDL Cholesterol 04/07/2018 52  0 - 99 mg/dL Final  . Total CHOL/HDL Ratio 04/07/2018 2   Final                  Men          Women1/2 Average Risk     3.4          3.3Average Risk          5.0          4.42X Average Risk          9.6          7.13X Average Risk          15.0          11.0                       .  NonHDL 04/07/2018 62.88   Final   NOTE:  Non-HDL goal should be 30 mg/dL higher than patient's LDL goal (i.e. LDL goal of < 70 mg/dL, would have non-HDL goal of < 100 mg/dL)  . Sodium 04/07/2018 139  135 - 145 mEq/L Final  . Potassium 04/07/2018 4.9  3.5 - 5.1 mEq/L Final  . Chloride 04/07/2018 105  96 - 112 mEq/L Final  . CO2 04/07/2018 30  19 - 32 mEq/L Final  . Glucose, Bld 04/07/2018 146* 70 - 99 mg/dL Final  . BUN 04/07/2018 12  6 - 23 mg/dL Final  . Creatinine, Ser 04/07/2018 0.74  0.40 - 1.20 mg/dL Final  . Total Bilirubin 04/07/2018 0.4  0.2 - 1.2 mg/dL Final  . Alkaline Phosphatase 04/07/2018 96  39 - 117 U/L Final  . AST 04/07/2018 48* 0 - 37 U/L Final  . ALT 04/07/2018 47* 0 - 35 U/L Final  . Total Protein 04/07/2018 6.3  6.0 - 8.3 g/dL Final  . Albumin 04/07/2018 3.7  3.5 - 5.2 g/dL Final  . Calcium 04/07/2018 9.2  8.4 - 10.5 mg/dL Final  . GFR 04/07/2018 82.39  >60.00 mL/min Final  . Hgb A1c MFr Bld 04/07/2018 7.8* 4.6 - 6.5 % Final   Glycemic Control Guidelines for People with Diabetes:Non Diabetic:  <6%Goal of Therapy: <7%Additional Action Suggested:  >8%     Allergies as of 04/08/2018      Reactions   Sulfa Antibiotics Rash      Medication List        Accurate as of 04/08/18  9:51 AM. Always use your most recent med list.          aspirin 325 MG EC tablet Take 325 mg by mouth daily.   CALCIUM + D PO Take 600 mg by mouth 2 (two) times daily.   cholecalciferol 1000 units tablet Commonly known as:  VITAMIN D Take 1,000 Units by mouth 2 (two) times daily.   clopidogrel 75 MG tablet Commonly known as:  PLAVIX Take 1 tablet (75 mg total) by mouth daily.   ezetimibe 10 MG tablet Commonly known as:  ZETIA Take 1 tablet (10 mg total) by mouth daily.   glucose blood test strip Commonly known as:  BAYER CONTOUR TEST Use as instructed to check sugar 6 times daily. Dx Code E10.65   insulin aspart 100 UNIT/ML injection Commonly known as:   NOVOLOG USE A MAX OF 200 UNITS PER DAY VIA INSULIN PUMP   insulin pump Soln Inject into the skin. Novolog Vial, max 200 units per day with pump   losartan 25 MG tablet Commonly known as:  COZAAR Take 1 tablet (25 mg total) by mouth daily.   metFORMIN 500 MG tablet Commonly known as:  GLUCOPHAGE Take 1 tablet (500 mg total) by mouth 3 (three) times daily. 1 tablet in the am and 2 at dinner   metoprolol succinate 100 MG 24 hr tablet Commonly known as:  TOPROL-XL TAKE 1 TABLET (100 MG TOTAL) BY MOUTH DAILY.   niacin 1000 MG CR tablet Commonly known as:  NIASPAN Take 1 tablet by mouth at  bedtime   nitroGLYCERIN 0.4 MG SL tablet Commonly known as:  NITROSTAT Place 1 tablet (0.4 mg total) under the tongue every 5 (five) minutes as needed for chest pain.   pioglitazone 15 MG tablet Commonly known as:  ACTOS TAKE 1 TABLET BY MOUTH EVERY DAY   rosuvastatin 40 MG tablet Commonly known as:  CRESTOR Take 1 tablet (  40 mg total) by mouth daily.   traMADol 50 MG tablet Commonly known as:  ULTRAM Take 1 tablet (50 mg total) by mouth every 6 (six) hours as needed for severe pain.       Allergies:  Allergies  Allergen Reactions  . Sulfa Antibiotics Rash    Past Medical History:  Diagnosis Date  . Arthritis    OA  . Atrial flutter ([place])    a. s/p TEE/DCCV 08/16/13 (normal LV function, no LAA thrombus).b. s/p ablation by Dr Lovena Le 09-23-2013  . Coronary artery disease    2009 LAD Promus stent  . Dysrhythmia    Aflutter, no issues after ablation 2014  . Fibroid   . Hyperlipidemia   . Insulin pump in place   . Osteopenia   . Type 1 diabetes mellitus on insulin therapy ([place])   . Valvular heart disease    a. Mild MR/TR by TEE 08/2013.    Past Surgical History:  Procedure Laterality Date  . ABLATION  09-23-2013   CTI by Dr Lovena Le  . APPENDECTOMY    . APPLICATION OF WOUND VAC Left 04/11/2017   Procedure: APPLICATION OF WOUND VAC LEFT KNEE;  Surgeon: Rod Can, MD;   Location: [facility];  Service: Orthopedics;  Laterality: Left;  . ATRIAL FLUTTER ABLATION N/A 09/23/2013   Procedure: ATRIAL FLUTTER ABLATION;  Surgeon: Evans Lance, MD;  Location: [facility] CATH LAB;  Service: Cardiovascular;  Laterality: N/A;  . CARDIOVERSION N/A 08/16/2013   Procedure: CARDIOVERSION;  Surgeon: Thayer Headings, MD;  Location: [place];  Service: Cardiovascular;  Laterality: N/A;  spoke with [name]  . CATARACT EXTRACTION Bilateral   . COLONOSCOPY    . CORONARY ANGIOPLASTY WITH STENT PLACEMENT    . MYOMECTOMY  1977  . ORIF PATELLA Left 04/11/2017  . ORIF PATELLA Left 04/11/2017   Procedure: OPEN REDUCTION INTERNAL (ORIF) FIXATION PATELLA LEFT KNEE;  Surgeon: Rod Can, MD;  Location: [facility];  Service: Orthopedics;  Laterality: Left;  . PELVIC LAPAROSCOPY    . TEE WITHOUT CARDIOVERSION N/A 08/16/2013   Procedure: TRANSESOPHAGEAL ECHOCARDIOGRAM (TEE);  Surgeon: Thayer Headings, MD;  Location: [facility] ENDOSCOPY;  Service: Cardiovascular;  Laterality: N/A;    Family History  Problem Relation Age of Onset  . CAD Father 85       died age 68  . CAD Brother 64       small vessel disease  . Atrial fibrillation Mother   . Diabetes Mother   . Hypertension Mother   . CAD Cousin        paternal  . CAD Paternal Grandfather        early onset  . Colon cancer Neg Hx     Social History:  reports that she has never smoked. She has never used smokeless tobacco. She reports that she drinks about 3.0 oz of alcohol per week. She reports that she does not use drugs.  REVIEW of systems:   She has had diabetic dyslipidemia with significantly high LDL particle number and this has been managed with Crestor 40 mg and Niaspan 1000 mg In 3/16 her LDL particle number was below 1000   This is now back down to below 900  She is usually taking her niacin regularly although occasionally may forget at bedtime but quite regular with her Crestor Now with adding Zetia in March her LDL is below  70   Lab Results  Component Value Date   CHOL 112 04/07/2018   HDL 49.00 04/07/2018  LDLCALC 52 04/07/2018   LDLDIRECT 79.0 01/11/2015   TRIG 56.0 04/07/2018   CHOLHDL 2 04/07/2018   She has had atrial arrhythmias treated with metoprolol  No recent problems with coronary artery disease   BP (!) 142/68 (BP Location: Left Arm, Patient Position: Sitting, Cuff Size: Normal)   Pulse 74   Ht 5\' 9"  (1.753 m)   Wt 209 lb 9.6 oz (95.1 kg)   SpO2 98%   BMI 30.95 kg/m    ASSESSMENT/PLAN:  DIABETES type 1 with fair control  Her A1c continues to be above 7% and now 7.8  See history of present illness for detailed discussion of her current blood sugar patterns and management with her pump and analysis of her home continuous glucose monitoring   Her blood sugars are more recently quite variable with no consistent pattern Although she has significantly high sugars late evening which may or may not be related to her meals this is not consistent Also overnight blood sugars are extremely variable including 2 episodes of hypoglycemia making it difficult to manually adjust her basal rates She has however done better with using her sensor more consistently and now 70% of the time  Recommendations: Discussed management of hypoglycemia and hypoglycemia Boluses at meals can be also better with additional insulin for higher fat meals, more than what she is estimating She can extend her boluses more frequently also She will need to try and remember to bolus consistently at meals especially lunch time before eating Discussed if she is having significant hypoglycemia with an adequate response to glucose tablets she will need to suspend her pump until blood sugar recovers, especially at bedtime Since her results from correction of high sugars is very variable will not change her correction factor To look into the newer version of the Dexcom sensor if available on Medicare, contact information given  for our local representative  HYPERLIPIDEMIA: Her LDL is now below 70 with her adding Zetia and she will continue this combination along with niacin as before   Counseling time on subjects discussed in assessment and plan sections is over 50% of today's 25 minute visit    Elayne Snare 04/08/18   Note: This office note was prepared with Dragon voice recognition system technology. Any transcriptional errors that result from this process are unintentional.

## 2018-04-08 ENCOUNTER — Ambulatory Visit (INDEPENDENT_AMBULATORY_CARE_PROVIDER_SITE_OTHER): Payer: Medicare Other | Admitting: Endocrinology

## 2018-04-08 ENCOUNTER — Encounter: Payer: Self-pay | Admitting: Endocrinology

## 2018-04-08 VITALS — BP 142/68 | HR 74 | Ht 69.0 in | Wt 209.6 lb

## 2018-04-08 DIAGNOSIS — E1065 Type 1 diabetes mellitus with hyperglycemia: Secondary | ICD-10-CM | POA: Diagnosis not present

## 2018-04-08 DIAGNOSIS — I2510 Atherosclerotic heart disease of native coronary artery without angina pectoris: Secondary | ICD-10-CM

## 2018-04-08 DIAGNOSIS — E782 Mixed hyperlipidemia: Secondary | ICD-10-CM

## 2018-04-08 LAB — LIPOPROTEIN ANALYSIS BY NMR
HDL PARTICLE NUMBER: 35.9 umol/L (ref >=30.5)
LDL PARTICLE NUMBER: 897 nmol/L (ref <1000)
LDL SIZE: 20.1 nm — AB (ref >20.5)
LP-IR Score: <25 (ref <=45)
Small LDL Particle Number: 535 nmol/L — ABNORMAL HIGH (ref <=527)

## 2018-04-08 NOTE — Patient Instructions (Addendum)
Calorie [employer] and [employer]

## 2018-04-20 ENCOUNTER — Encounter: Payer: Self-pay | Admitting: Dietician

## 2018-04-20 ENCOUNTER — Encounter: Payer: Medicare Other | Attending: Endocrinology | Admitting: Dietician

## 2018-04-20 ENCOUNTER — Telehealth: Payer: Self-pay | Admitting: Cardiovascular Disease

## 2018-04-20 DIAGNOSIS — Z713 Dietary counseling and surveillance: Secondary | ICD-10-CM | POA: Insufficient documentation

## 2018-04-20 DIAGNOSIS — E1065 Type 1 diabetes mellitus with hyperglycemia: Secondary | ICD-10-CM | POA: Insufficient documentation

## 2018-04-20 DIAGNOSIS — Z6830 Body mass index (BMI) 30.0-30.9, adult: Secondary | ICD-10-CM | POA: Diagnosis not present

## 2018-04-20 DIAGNOSIS — R600 Localized edema: Secondary | ICD-10-CM

## 2018-04-20 MED ORDER — FUROSEMIDE 20 MG PO TABS: 20.0000 mg | ORAL_TABLET | Freq: Every day | ORAL | 11 refills | Status: DC

## 2018-04-20 MED ORDER — POTASSIUM CHLORIDE ER 10 MEQ PO TBCR: 10.0000 meq | EXTENDED_RELEASE_TABLET | Freq: Every day | ORAL | 11 refills | Status: DC

## 2018-04-20 NOTE — Telephone Encounter (Signed)
Agree with note by [name], Will start Lasix 20 mg a day , Kdur 10 meq a day for 5 days Order echo for assessment of LV function BMP in 5-7 days . [name] should call us back an report how [name] is doing after completing the lasix / Kdur

## 2018-04-20 NOTE — Telephone Encounter (Signed)
Spoke with patient who reports significant bilateral foot swelling and weight gain, 5 lb in a week and 10 lb in the last month. She states nothing has changed significantly with her diet but admits maybe she needs to pay better attention.  Saw a dietician today and was encouraged to call her cardiologist because of the significant amount of swelling in her feet.  Eats lunch meat but eats [place] low sodium roast beef and ham. She admits to eating soup a couple of times per week and I advised her that this is likely very high is sodium. I asked about SOB and she states she is slower to get up a hill when walking the dog but does notice SOB at other times; denies chest discomfort or orthopnea Her last echo was 2014 and she denies hx of CHF. I advised her to monitor her diet and to limit sodium to <2000 mg daily and that I will forward message to Dr. Acie Fredrickson for advice. She verbalized understanding and agreement and thanked me for the call.

## 2018-04-20 NOTE — Progress Notes (Signed)
Diabetes Self-Management Education  Visit Type: First/Initial  Appt. Start Time: 1005 Appt. End Time: 1105  04/20/2018  [name], identified by name and date of birth, is a 72 y.o. female with a diagnosis of Diabetes: Type 1 since age 83. History includes a stent.  Today she presents for tips for weight loss and blood sugar control.  She has gained 5 lbs in the past 1 1/2 weeks and 10 lbs in the past 3 months.  She has very noticeable pitting edema in her feet.  (When she removes her Sandals, deep marks approximately 5 mm deep remains.)  She has been following a low sodium diet but with needed improvement.  Dr. Acie Fredrickson is her cardiologist and she has not seen him since January.  I asked that she call him related to the edema.  I have also messaged Dr. [name] as she is on Actos that can cause fluid gain.  Medications include: Actos Novolog in her insulin pump (Medtronic).  This is not the newest but she is not sure which one it is. She also has a Dexcom CGM (she forgot this today). Pump settings include a carbohydrate ratio of 1:15 at Breakfast and lunch and 1:7 for dinner.  She does not always enter her carbohydrates for snacks.  Sensitivity factor of 1:35.  Patient lives with her husband who has stage 4 CKD and has been trying to follow a low sodium diet due to his needs but they eat out 2 or more times per week, occasional canned soup and other higher sodium foods.  They are members of the [facility] which has a fitness center that she can use.  She broke her knee cap and ankle in the past year which inhibits exercise.  Her mother passed away in 2023/01/18.  She has gained 30 lbs in the past 7 years.  She is a retired Automotive engineer.  Basic meal plan provided with appropriate carbohydrates for weight loss.  Patient able to verbalize need for changes regarding sodium.  ASSESSMENT  Height 5\' 10"  (1.778 m), weight 214 lb (97.1 kg). Body mass index is 30.71  kg/m.  Diabetes Self-Management Education - 04/20/18 1034      Visit Information   Visit Type  First/Initial      Initial Visit   Diabetes Type  Type 1    Are you currently following a meal plan?  No    Are you taking your medications as prescribed?  Yes    Date Diagnosed  1981      Health Coping   How would you rate your overall health?  Good      Psychosocial Assessment   Patient Belief/Attitude about Diabetes  Defeat/Burnout    Self-care barriers  None    Self-management support  Doctor's office;Family    Other persons present  Patient    Patient Concerns  Nutrition/Meal planning;Weight Control;Glycemic Control    Special Needs  None    Preferred Learning Style  No preference indicated    Learning Readiness  Ready    How often do you need to have someone help you when you read instructions, pamphlets, or other written materials from your doctor or pharmacy?  1 - Never    What is the last grade level you completed in school?  4 years college      Pre-Education Assessment   Patient understands the diabetes disease and treatment process.  Demonstrates understanding / competency    Patient understands incorporating nutritional  management into lifestyle.  Demonstrates understanding / competency    Patient undertands incorporating physical activity into lifestyle.  Demonstrates understanding / competency    Patient understands using medications safely.  Demonstrates understanding / competency    Patient understands monitoring blood glucose, interpreting and using results  Demonstrates understanding / competency    Patient understands prevention, detection, and treatment of acute complications.  Demonstrates understanding / competency    Patient understands prevention, detection, and treatment of chronic complications.  Demonstrates understanding / competency    Patient understands how to develop strategies to address psychosocial issues.  Demonstrates understanding / competency     Patient understands how to develop strategies to promote health/change behavior.  Demonstrates understanding / competency      Complications   Last HgB A1C per patient/outside source  7.8 % 04/07/18 and 7.7% 01/12/18    How often do you check your blood sugar?  3-4 times/day    Fasting Blood glucose range (mg/dL)  >200;180-200;130-179;70-129    Number of hypoglycemic episodes per month  1    Can you tell when your blood sugar is low?  Yes    What do you do if your blood sugar is low?  glucose tabs    Have you had a dilated eye exam in the past 12 months?  Yes    Have you had a dental exam in the past 12 months?  Yes    Are you checking your feet?  Yes    How many days per week are you checking your feet?  5      Dietary Intake   Breakfast  Rare OR poached egg on toast OR plain greek yogurt and fruit    Snack (morning)  occasional grazing    Lunch  sandwich with LS lunch meat, fritos or cheetos, rare fruit    Snack (afternoon)  occasional fruit    Dinner  [place], stuffing, corn, green beans OR chicken casserole with LS cream soup OR grilling OR hamburger without bun, baked beans     Snack (evening)  none    Beverage(s)  diet coke, 4-5 glass wine per week, water      Exercise   Exercise Type  Light (walking / raking leaves) stationary bike, weights    How many days per week to you exercise?  3    How many minutes per day do you exercise?  30    Total minutes per week of exercise  90      Patient Education   Previous Diabetes Education  Yes (please comment) Linda, CDE 2016    Nutrition management   Carbohydrate counting;Information on hints to eating out and maintain blood glucose control.;Food label reading, portion sizes and measuring food.;Meal options for control of blood glucose level and chronic complications.;Other (comment) low sodium, modified carbohydrate for calorie control    Physical activity and exercise   Helped patient identify appropriate exercises in relation to his/her  diabetes, diabetes complications and other health issue.;Role of exercise on diabetes management, blood pressure control and cardiac health.    Medications  Reviewed patients medication for diabetes, action, purpose, timing of dose and side effects.    Monitoring  Other (comment) Dexcom info    Psychosocial adjustment  Worked with patient to identify barriers to care and solutions      Individualized Goals (developed by patient)   Nutrition  Follow meal plan discussed;Other (comment) Carbohydrate counting    Physical Activity  Exercise 3-5 times per week;30 minutes  per day As allowed by MD    Medications  take my medication as prescribed    Monitoring   Other (comment) continue CGM    Problem Solving  Low sodium when eating out    Reducing Risk  examine blood glucose patterns;increase portions of healthy fats    Health Coping  discuss diabetes with (comment) MD, RD, CDE      Post-Education Assessment   Patient understands the diabetes disease and treatment process.  Demonstrates understanding / competency    Patient understands incorporating nutritional management into lifestyle.  Demonstrates understanding / competency    Patient undertands incorporating physical activity into lifestyle.  Demonstrates understanding / competency    Patient understands using medications safely.  Demonstrates understanding / competency    Patient understands monitoring blood glucose, interpreting and using results  Demonstrates understanding / competency    Patient understands prevention, detection, and treatment of acute complications.  Demonstrates understanding / competency    Patient understands prevention, detection, and treatment of chronic complications.  Demonstrates understanding / competency    Patient understands how to develop strategies to address psychosocial issues.  Demonstrates understanding / competency    Patient understands how to develop strategies to promote health/change behavior.   Demonstrates understanding / competency      Outcomes   Expected Outcomes  Demonstrated interest in learning. Expect positive outcomes    Future DMSE  PRN    Program Status  Completed       Individualized Plan for Diabetes Self-Management Training:   Learning Objective:  Patient will have a greater understanding of diabetes self-management. Patient education plan is to attend individual and/or group sessions per assessed needs and concerns.   Plan:   Patient Instructions  Follow a low sodium diet. Ask for foods eaten out to be prepared without salt. Be sure to enter your carbohydrates into your pump. Eat mindfully and stop when satisfied.    Expected Outcomes:  Demonstrated interest in learning. Expect positive outcomes  Education material provided: Food label handouts and Meal plan card, Low sodium nutrition therapy from [facility]  If problems or questions, patient to contact team via:  Phone  Future DSME appointment: PRN

## 2018-04-20 NOTE — Patient Instructions (Signed)
Follow a low sodium diet. Ask for foods eaten out to be prepared without salt. Be sure to enter your carbohydrates into your pump. Eat mindfully and stop when satisfied.

## 2018-04-20 NOTE — Telephone Encounter (Signed)
Pt calling   Pt c/o swelling: STAT is pt has developed SOB within 24 hours  1) How much weight have you gained and in what time span? 5 lbs from last week and 10lbs in a month  2) If swelling, where is the swelling located? feet  Are you currently taking a fluid pill? No  3) Are you currently SOB? no  4) Do you have a log of your daily weights (if so, list)?No  5) Have you gained 3 pounds in a day or 5 pounds in a week? yes  6) Have you traveled recently?

## 2018-04-20 NOTE — Telephone Encounter (Signed)
Left detailed message on patient's voice mail of plan per Dr. Acie Fredrickson. I advised that I will call back tomorrow to discuss further and to answer any questions.

## 2018-04-21 NOTE — Telephone Encounter (Signed)
Reviewed Dr. Elmarie Shiley advice with patient who verbalized understanding. She is aware someone from our office will call to schedule echo. I advised her to call back tomorrow afternoon to report urine output and to call Monday to report status of lower extremity edema after 5 days of therapy. She verbalized understanding and agreement with plan and was very thankful for the help.

## 2018-04-21 NOTE — Telephone Encounter (Signed)
Called patient and told her to stop the Actos per recommendations of Dr. Cruzita Lederer as well as basal rate changes (increase overnight basal rate by 0.2 u/hr) if blood sugars rise off Actos.  Discussed that fluid retention can be a side effect of Actos.  Patient verbalized understanding and to call for any questions.  Antonieta Iba, RD, LDN, CDE

## 2018-04-22 NOTE — Telephone Encounter (Signed)
Received call from patient who called to report that she has noticed increased urine output and decreased swelling in her feet since starting Lasix. I advised her to continue Lasix and Kdur through Saturday. She asked about getting the echo scheduled and I advised someone left a message for her earlier. I scheduled patient for echo on Friday 6/21 and advised that we will call her with results next week. She verbalized understanding and agreement with plan and thanked me for the call.

## 2018-04-24 ENCOUNTER — Other Ambulatory Visit: Payer: Self-pay

## 2018-04-24 ENCOUNTER — Ambulatory Visit (HOSPITAL_COMMUNITY): Payer: Medicare Other | Attending: Cardiovascular Disease

## 2018-04-24 DIAGNOSIS — Z8249 Family history of ischemic heart disease and other diseases of the circulatory system: Secondary | ICD-10-CM | POA: Insufficient documentation

## 2018-04-24 DIAGNOSIS — E119 Type 2 diabetes mellitus without complications: Secondary | ICD-10-CM | POA: Insufficient documentation

## 2018-04-24 DIAGNOSIS — I071 Rheumatic tricuspid insufficiency: Secondary | ICD-10-CM | POA: Insufficient documentation

## 2018-04-24 DIAGNOSIS — I2510 Atherosclerotic heart disease of native coronary artery without angina pectoris: Secondary | ICD-10-CM | POA: Insufficient documentation

## 2018-04-24 DIAGNOSIS — I4892 Unspecified atrial flutter: Secondary | ICD-10-CM | POA: Insufficient documentation

## 2018-04-24 DIAGNOSIS — E785 Hyperlipidemia, unspecified: Secondary | ICD-10-CM | POA: Diagnosis not present

## 2018-04-24 DIAGNOSIS — R600 Localized edema: Secondary | ICD-10-CM | POA: Insufficient documentation

## 2018-04-27 ENCOUNTER — Telehealth: Payer: Self-pay | Admitting: Nurse Practitioner

## 2018-04-27 MED ORDER — POTASSIUM CHLORIDE ER 10 MEQ PO TBCR: 10.0000 meq | EXTENDED_RELEASE_TABLET | Freq: Every day | ORAL | 3 refills | Status: DC | PRN

## 2018-04-27 MED ORDER — FUROSEMIDE 20 MG PO TABS: 20.0000 mg | ORAL_TABLET | Freq: Every day | ORAL | 3 refills | Status: DC | PRN

## 2018-04-27 NOTE — Addendum Note (Signed)
Addended by: Emmaline Life on: 04/27/2018 01:55 PM   Modules accepted: Orders

## 2018-04-27 NOTE — Telephone Encounter (Signed)
Reviewed results of echo with patient. She states that taking the Lasix x 5 days greatly improved her leg swelling and has helped her lose some weight. She states she is also monitoring her sodium intake and following better diet. I advised her that she may take lasix and Kdur as needed for leg swelling and to call back if this occurs frequently. She states Dr. [name] advised her also to stop Actos. She is due for follow-up in January and I asked her to call back prior to that time with questions or concerns. She thanked me for the call.

## 2018-04-27 NOTE — Telephone Encounter (Signed)
-----   Message from Thayer Headings, MD sent at 04/27/2018  1:17 PM EDT ----- Normal LV systolic funciton.  Grade 1 diastolic dysfunction

## 2018-05-03 ENCOUNTER — Other Ambulatory Visit: Payer: Self-pay | Admitting: Endocrinology

## 2018-06-04 DIAGNOSIS — N390 Urinary tract infection, site not specified: Secondary | ICD-10-CM | POA: Diagnosis not present

## 2018-06-12 ENCOUNTER — Telehealth: Payer: Self-pay | Admitting: Internal Medicine

## 2018-06-12 NOTE — Telephone Encounter (Signed)
Pt is requesting to schedule with Vaughan Basta. She thinks she needs to adjust insulin. Please advise

## 2018-06-12 NOTE — Telephone Encounter (Signed)
She can see [name] as she may not be able to see me right away

## 2018-06-12 NOTE — Telephone Encounter (Signed)
Cane we schedule with Vaughan Basta or would you like her to make appt with you, please advise

## 2018-06-12 NOTE — Telephone Encounter (Signed)
Please schedule with linda

## 2018-06-23 ENCOUNTER — Other Ambulatory Visit: Payer: Self-pay | Admitting: Endocrinology

## 2018-06-23 DIAGNOSIS — E1065 Type 1 diabetes mellitus with hyperglycemia: Secondary | ICD-10-CM

## 2018-06-24 ENCOUNTER — Other Ambulatory Visit: Payer: Self-pay

## 2018-06-24 ENCOUNTER — Telehealth: Payer: Self-pay | Admitting: Endocrinology

## 2018-06-24 ENCOUNTER — Encounter: Payer: Medicare Other | Attending: Endocrinology | Admitting: Nutrition

## 2018-06-24 DIAGNOSIS — Z713 Dietary counseling and surveillance: Secondary | ICD-10-CM | POA: Insufficient documentation

## 2018-06-24 DIAGNOSIS — E1065 Type 1 diabetes mellitus with hyperglycemia: Secondary | ICD-10-CM | POA: Diagnosis not present

## 2018-06-24 DIAGNOSIS — Z6830 Body mass index (BMI) 30.0-30.9, adult: Secondary | ICD-10-CM | POA: Insufficient documentation

## 2018-06-24 MED ORDER — ROSUVASTATIN CALCIUM 40 MG PO TABS: 40.0000 mg | ORAL_TABLET | Freq: Every day | ORAL | 1 refills | Status: DC

## 2018-06-24 MED ORDER — EZETIMIBE 10 MG PO TABS: 10.0000 mg | ORAL_TABLET | Freq: Every day | ORAL | 1 refills | Status: DC

## 2018-06-24 MED ORDER — CLOPIDOGREL BISULFATE 75 MG PO TABS: 75.0000 mg | ORAL_TABLET | Freq: Every day | ORAL | 1 refills | Status: DC

## 2018-06-24 NOTE — Telephone Encounter (Signed)
Has this pt scheduled with you yet?

## 2018-06-24 NOTE — Telephone Encounter (Signed)
Yes.  Today

## 2018-06-24 NOTE — Telephone Encounter (Signed)
clopidogrel (PLAVIX) 75 MG tablet    rosuvastatin (CRESTOR) 40 MG tablet    ezetimibe (ZETIA) 10 MG tablet   Patient needs refills sent into the pharmacy She said they would be sending over a refill request but thought it would happen faster if she calls ahead  [facility] [place] [place] to Registered [employer] Sites

## 2018-06-24 NOTE — Telephone Encounter (Signed)
Prescriptions have been ordered.

## 2018-06-24 NOTE — Progress Notes (Addendum)
Typical day: 7AM:  Up.  No breakfast.  Says blood sugars high in the AM (170s-low 200s)  Left meter at restaurant for lunch today.  Pt. Very upset. 11AM.  Pt. Eats no breakfast--says she boluses in the AM for the high reading, but blood sugars continue to rise even higher by 11-12 when she eats lunch.   Lunch is no bread sandwich of meat and cheese-low sodium.  Non calorie drink 6-7 PM: supper meal is high in fat and protein. Snacks on peanuts all afternoon and evening. HS.  Says blood sugars are high, and will do a correction dose, but still high in the AM.   Discussed the idea of balalnced meals and the need for breakfast.  Gave 3 breakfast that have 150 calories or less in them for her to try.  Also told her to watch her fat--not putting lots of fat on her corn, and other veg., she is eating for supper.  Stop the nuts.  Snack on raw veg., and low fat ranch dressing.  Also stop eating taco salads and other high fat crackers and breads.  Suggestions given for snacks that have 100calories at HS.   Strongly suggested 30-40 min. Of exercise daily.   Pump was not able to download.  Computer would not recognize pump.   Basal rate was increase by 0.1 from 3AM to 7AM, and correction dose drop during the day from 35 to 30. Will call me tomorrow with blood sugar readings at: acS, HS, 3AM, and FBS, an 11AM.

## 2018-06-24 NOTE — Patient Instructions (Signed)
Test blood sugar before supper, bedtime, 3AM (if awake), and 7AM, and 11AM (before first meal).   Make sure all meals have 10-30 grams of carbohydrate, and some protein, and very little fat.   Walk for 30-40 min. Every day.

## 2018-07-10 ENCOUNTER — Telehealth: Payer: Self-pay | Admitting: Endocrinology

## 2018-07-10 MED ORDER — ROSUVASTATIN CALCIUM 40 MG PO TABS: 40.0000 mg | ORAL_TABLET | Freq: Every day | ORAL | 1 refills | Status: DC

## 2018-07-10 MED ORDER — EZETIMIBE 10 MG PO TABS: 10.0000 mg | ORAL_TABLET | Freq: Every day | ORAL | 1 refills | Status: DC

## 2018-07-10 MED ORDER — CLOPIDOGREL BISULFATE 75 MG PO TABS: 75.0000 mg | ORAL_TABLET | Freq: Every day | ORAL | 1 refills | Status: DC

## 2018-07-10 NOTE — Telephone Encounter (Signed)
ezetimibe (ZETIA) 10 MG tablet   clopidogrel (PLAVIX) 75 MG tablet  rosuvastatin (CRESTOR) 40 MG tablet  Patient stated that they have not recieved Prescriptions for the patient.    [facility] [place] [place] [facility] to [facility] [employer] [facility]

## 2018-07-10 NOTE — Telephone Encounter (Signed)
Sent!

## 2018-07-27 NOTE — Progress Notes (Addendum)
Patient ID: [name], female   DOB: August 07, 1946, 72 y.o.   MRN: [id number]   Reason for visit: DIABETES followup.  Diagnosis: Type 1 diabetes mellitus, date of diagnosis: 1979.    HISTORY of present illness:   Insulin Pump: CURRENT brand: Medtronic 630  The pump settings are: Midnight = 0.9,  2 AM  0.8, 5 AM = 1.9, 8 AM = 1.7, 11 AM = 1.0; 5 PM = 2.1, 7 PM = 2.6 and 10 PM = 1.7.   Total daily insulin = up to 60 units   Carbohydrate ratio 1:15 breakfast, lunch and 1:7 at dinner; sensitivity 1: 35 until 6 PM and then 1:30; target  110-30  Insulin on board indicator on, duration 4 hrs   DIABETES: She has had long-standing diabetes with A1c levels usually above target. In 10/13 ACTOS was restarted because of her large insulin requirement and diabetic dyslipidemia.  Her blood sugars improved significantly reduced.  Also has been taking metformin as insulin sensitizer  RECENT history:   Her A1c is still relatively high at 7.9  Current blood sugar patterns from pump and Sensor download and interpretation of her  DexCom CGM download:   She started having some hyperglycemia in August and had a visit with the nurse educator with some adjustments made  However despite this her blood sugars at least the last 2 weeks have been very significantly high with only 36% of her readings within the 70-180 target  Previously had only reduce basal rate between 2 AM and 5 AM but now appears to have the basal rate of only 0.6 between 2 AM and 9 AM  Also not clear why her blood sugars are much higher right at suppertime despite a relatively high carbohydrate coverage ratio  This may also depend on the type of meal with occasional increases after foods like Chinese  Only occasionally appears to be having high sugars from late boluses  Correction of high readings appears to be somewhat inadequate at least in the evenings  She is trying to exercise regularly in the mornings but she does  not think this causes changes in her blood sugars  Weight is a couple of pounds less Overall her carbohydrate intake is still relatively large at suppertime, usually 70 to 80 g  CONTINUOUS GLUCOSE MONITORING RECORD INTERPRETATION    Dates of Recording: 9/11 through 07/28/2018  Sensor  summary: Sensor has been active 80% of the time with some gaps in the data on 3 of the days mostly in the afternoons  Results statistics:   CGM use % of time  80  Average and SD  204+/-62, was 176  Time in range     36   %  % Time Above 180  64  % Time above 250  22  % Time Below target  0.7    Glycemic patterns summary:   She had significant hyperglycemia in the early morning hours peaking at 6 AM and also significant hyperglycemia late evening peaking around 10 PM Otherwise blood sugars are averaging about 180 the rest of the time Variability is more consistent through the night and late evening  Hyperglycemic episodes as above these are mostly through the night and late evening but also at times in the late afternoon  Hypoglycemic episodes occurred minimally with only a low normal blood sugar yesterday before suppertime  Overnight periods: Her blood sugars are typically starting off around 200 at midnight and little coming down until about  2 AM and mostly rising significantly after 3 AM with continued hyperglycemia on about 8 AM  Preprandial periods: Blood sugars are very significantly high in the morning at breakfast time and mostly over 200 as also before dinnertime.  However before dinner blood sugars have been near normal for last 2 days.  Blood sugars around lunchtime are mildly increased overall but show less variability  Postprandial periods: After breakfast: Blood sugars are generally level after breakfast and lunch but rising after dinner Some of the increase readings after dinner are significant with postprandial readings as high as 300  GLUCOSE from fingerstick data:  PRE-MEAL  Fasting Lunch Dinner Bedtime Overall  Glucose range:  199-381   99-336    Mean/median:         Hypoglycemic awareness: Has symptoms of feeling tired, headache, sweaty. Variable recognition threshold present  Sometimes may not be aware during the night and will be notified by her sensor that the sugar is low  She has symptoms when blood glucose is less than 60. Uses glucose tablets for treatment when not at home but otherwise eats a snack .    DIET has been usually fairly controlled with carbohydrate and fat intake, tends to eat out on weekends As before her mealtimes are variable.  Usually trying to have low fat meals except sometimes when going out  Food preferences: Yogurt And cottage cheese for breakfast usually.   Physical activity: exercise: trying to go for exercise at the gym late morning  Previously after exercise blood sugar will get lower in 1-2 hrs but sometimes it may get lower while walking and she carries glucose tablets with her. .  Wt Readings from Last 3 Encounters:  07/28/18 207 lb (93.9 kg)  06/24/18 207 lb (93.9 kg)  04/20/18 214 lb (97.1 kg)    LABS:  Lab Results  Component Value Date   HGBA1C 7.9 (A) 07/28/2018   HGBA1C 7.8 (H) 04/07/2018   HGBA1C 7.7 (H) 01/12/2018   Lab Results  Component Value Date   MICROALBUR <0.7 01/12/2018   LDLCALC 52 04/07/2018   CREATININE 0.74 04/07/2018    Office Visit on 07/28/2018  Component Date Value Ref Range Status  . Hemoglobin A1C 07/28/2018 7.9* 4.0 - 5.6 % Final    Allergies as of 07/28/2018      Reactions   Sulfa Antibiotics Rash      Medication List        Accurate as of 07/28/18  9:58 AM. Always use your most recent med list.          aspirin 325 MG EC tablet Take 325 mg by mouth daily.   CALCIUM + D PO Take 600 mg by mouth 2 (two) times daily.   cholecalciferol 1000 units tablet Commonly known as:  VITAMIN D Take 1,000 Units by mouth 2 (two) times daily.   clopidogrel 75 MG  tablet Commonly known as:  PLAVIX Take 1 tablet (75 mg total) by mouth daily.   ezetimibe 10 MG tablet Commonly known as:  ZETIA Take 1 tablet (10 mg total) by mouth daily.   glucose blood test strip Use as instructed to check sugar 6 times daily. Dx Code E10.65   insulin aspart 100 UNIT/ML injection Commonly known as:  novoLOG USE A MAX OF 200 UNITS PER DAY VIA INSULIN PUMP   insulin pump Soln Inject into the skin. Novolog Vial, max 200 units per day with pump   losartan 25 MG tablet Commonly known as:  COZAAR  Take 1 tablet (25 mg total) by mouth daily.   metFORMIN 500 MG tablet Commonly known as:  GLUCOPHAGE Take 1 tablet (500 mg total) by mouth 3 (three) times daily. 1 tablet in the am and 2 at dinner   metoprolol succinate 100 MG 24 hr tablet Commonly known as:  TOPROL-XL TAKE 1 TABLET (100 MG TOTAL) BY MOUTH DAILY.   niacin 1000 MG CR tablet Commonly known as:  NIASPAN TAKE 1 TABLET BY MOUTH AT BEDTIME   nitroGLYCERIN 0.4 MG SL tablet Commonly known as:  NITROSTAT Place 1 tablet (0.4 mg total) under the tongue every 5 (five) minutes as needed for chest pain.   pioglitazone 15 MG tablet Commonly known as:  ACTOS TAKE 1 TABLET BY MOUTH EVERY DAY   potassium chloride 10 MEQ tablet Commonly known as:  K-DUR Take 1 tablet (10 mEq total) by mouth daily as needed (take with Lasix).   rosuvastatin 40 MG tablet Commonly known as:  CRESTOR Take 1 tablet (40 mg total) by mouth daily.   traMADol 50 MG tablet Commonly known as:  ULTRAM Take 1 tablet (50 mg total) by mouth every 6 (six) hours as needed for severe pain.       Allergies:  Allergies  Allergen Reactions  . Sulfa Antibiotics Rash    Past Medical History:  Diagnosis Date  . Arthritis    OA  . Atrial flutter ([place])    a. s/p TEE/DCCV 08/16/13 (normal LV function, no LAA thrombus).b. s/p ablation by Dr Lovena Le 09-23-2013  . Coronary artery disease    2009 LAD Promus stent  . Dysrhythmia     Aflutter, no issues after ablation 2014  . Fibroid   . Hyperlipidemia   . Insulin pump in place   . Osteopenia   . Type 1 diabetes mellitus on insulin therapy ([place])   . Valvular heart disease    a. Mild MR/TR by TEE 08/2013.    Past Surgical History:  Procedure Laterality Date  . ABLATION  09-23-2013   CTI by Dr Lovena Le  . APPENDECTOMY    . APPLICATION OF WOUND VAC Left 04/11/2017   Procedure: APPLICATION OF WOUND VAC LEFT KNEE;  Surgeon: Rod Can, MD;  Location: [facility];  Service: Orthopedics;  Laterality: Left;  . ATRIAL FLUTTER ABLATION N/A 09/23/2013   Procedure: ATRIAL FLUTTER ABLATION;  Surgeon: Evans Lance, MD;  Location: [facility] CATH LAB;  Service: Cardiovascular;  Laterality: N/A;  . CARDIOVERSION N/A 08/16/2013   Procedure: CARDIOVERSION;  Surgeon: Thayer Headings, MD;  Location: [place];  Service: Cardiovascular;  Laterality: N/A;  spoke with [name]  . CATARACT EXTRACTION Bilateral   . COLONOSCOPY    . CORONARY ANGIOPLASTY WITH STENT PLACEMENT    . MYOMECTOMY  1977  . ORIF PATELLA Left 04/11/2017  . ORIF PATELLA Left 04/11/2017   Procedure: OPEN REDUCTION INTERNAL (ORIF) FIXATION PATELLA LEFT KNEE;  Surgeon: Rod Can, MD;  Location: [facility];  Service: Orthopedics;  Laterality: Left;  . PELVIC LAPAROSCOPY    . TEE WITHOUT CARDIOVERSION N/A 08/16/2013   Procedure: TRANSESOPHAGEAL ECHOCARDIOGRAM (TEE);  Surgeon: Thayer Headings, MD;  Location: [facility] ENDOSCOPY;  Service: Cardiovascular;  Laterality: N/A;    Family History  Problem Relation Age of Onset  . CAD Father 14       died age 82  . CAD Brother 61       small vessel disease  . Atrial fibrillation Mother   . Diabetes Mother   . Hypertension Mother   .  CAD Cousin        paternal  . CAD Paternal Grandfather        early onset  . Colon cancer Neg Hx     Social History:  reports that she has never smoked. She has never used smokeless tobacco. She reports that she drinks about 5.0 standard drinks of alcohol  per week. She reports that she does not use drugs.  REVIEW of systems:  EDEMA: She said that in May or June she was having consistent edema of her feet and was given Lasix temporarily along with stopping her Actos on the advice of the PCP and cardiologist She had taken Actos for several years before without any problems however Her echo shows only grade 1 diastolic dysfunction and normal systolic function  BP Readings from Last 3 Encounters:  07/28/18 138/82  04/08/18 (!) 142/68  01/15/18 120/72     She has had diabetic dyslipidemia with significantly high LDL particle number and this has been managed with Crestor 40 mg and Niaspan 1000 mg In 3/16 her LDL particle number was below 1000   This is now back down to below 900  She is usually taking her niacin regularly although occasionally may forget at bedtime but quite regular with her Crestor Now with adding Zetia in March her LDL is below 70   Lab Results  Component Value Date   CHOL 112 04/07/2018   HDL 49.00 04/07/2018   LDLCALC 52 04/07/2018   LDLDIRECT 79.0 01/11/2015   TRIG 56.0 04/07/2018   CHOLHDL 2 04/07/2018   She has had atrial arrhythmias treated with metoprolol  No recent problems with coronary artery disease   BP 138/82   Pulse 68   Ht 5' 9.5" (1.765 m)   Wt 207 lb (93.9 kg)   SpO2 96%   BMI 30.13 kg/m    ASSESSMENT/PLAN:  DIABETES type 1 with inadequate control on Medtronic insulin pump  Her A1c continues to be above 7% and now 7.9  See history of present illness for detailed discussion of her current blood sugar patterns and management with her pump and analysis of her home continuous glucose monitoring   Her blood sugars are very significantly high overall and averaging just over 200 on a CGM  Most of her hyperglycemia is related to inadequate basal rates in the middle of the night, some of it was probably changed inappropriately Blood sugars are also markedly increased after eating meal  later at night either from inadequate boluses or inadequate basal rate for both She tends to have mild hypoglycemia during the day but not as consistent Also may not be getting enough insulin to correct her high readings  Another factor may be her not taking Actos, she does not have known insulin resistance syndrome  Recommendations: Basal rate changes as inpatient instructions Carbohydrate coverage 1: 5.5 at suppertime Sensitivity 1: 25 in the evenings To call if she is having consistently abnormal patterns and continue using Dexcom sensor  History of EDEMA: She was only on 15 mg of Actos in the past and only recently had some edema which may or may not be related Because of her normal cardiac function and recent tendency to hyperglycemia would recommend that we try this again and watch for edema Also may consider increasing her losartan   Counseling time on subjects discussed in assessment and plan sections is over 50% of today's 25 minute visit  Patient Instructions  BASAL rate changes:  4 AM-7 AM = 0.9  7 AM-9 AM = 1.95 5 PM-6:30 PM = 2.1 6:30 PM-10 PM = 3.0 10 PM-midnight = 1.7  Insulin to carbohydrate ratio: At 5 PM = 5.5  Correction factor/sensitivity: Start a new setting at 5 PM of 25 Stay on 1: 40 at 9 PM  Influenza vaccine given  Elayne Snare 07/28/18   Note: This office note was prepared with Dragon voice recognition system technology. Any transcriptional errors that result from this process are unintentional.

## 2018-07-28 ENCOUNTER — Ambulatory Visit (INDEPENDENT_AMBULATORY_CARE_PROVIDER_SITE_OTHER): Payer: Medicare Other | Admitting: Endocrinology

## 2018-07-28 ENCOUNTER — Encounter: Payer: Self-pay | Admitting: Endocrinology

## 2018-07-28 VITALS — BP 138/82 | HR 68 | Ht 69.5 in | Wt 207.0 lb

## 2018-07-28 DIAGNOSIS — Z23 Encounter for immunization: Secondary | ICD-10-CM

## 2018-07-28 DIAGNOSIS — E1065 Type 1 diabetes mellitus with hyperglycemia: Secondary | ICD-10-CM

## 2018-07-28 DIAGNOSIS — I2510 Atherosclerotic heart disease of native coronary artery without angina pectoris: Secondary | ICD-10-CM | POA: Diagnosis not present

## 2018-07-28 LAB — POCT GLYCOSYLATED HEMOGLOBIN (HGB A1C): HEMOGLOBIN A1C: 7.9 % — AB (ref 4.0–5.6)

## 2018-07-28 NOTE — Patient Instructions (Signed)
BASAL rate changes:  4 AM-7 AM = 0.9  7 AM-9 AM = 1.95 5 PM-6:30 PM = 2.1 6:30 PM-10 PM = 3.0 10 PM-midnight = 1.7  Insulin to carbohydrate ratio: At 5 PM = 5.5  Correction factor/sensitivity: Start a new setting at 5 PM of 25 Stay on 1: 40 at 9 PM

## 2018-08-17 ENCOUNTER — Telehealth: Payer: Self-pay | Admitting: Endocrinology

## 2018-08-17 NOTE — Telephone Encounter (Signed)
Pt called stating blood sugar still rises around 7 am. She's also stating Dexcom needs confirmation to refill her glucose test strips.

## 2018-08-18 MED FILL — SHINGRIX 50 MCG SUS: 50 | 1 days supply | Qty: 1 | Fill #0

## 2018-08-18 NOTE — Telephone Encounter (Signed)
Please advise 

## 2018-08-18 NOTE — Telephone Encounter (Signed)
Confirm that blood sugars are high at 7 AM and not later in the morning.  If so increase the basal rate at 4 AM up to 1.0.  Please send whatever prescription is needed for the [facility]

## 2018-08-18 NOTE — Telephone Encounter (Signed)
Spoke with the patient and she was unsure about the exact time her blood sugar is getting high she states she will keep an eye on it for a couple of days and call back to let us know- patient stated she has everything she needw for the Dexcom at this time

## 2018-08-21 DIAGNOSIS — E119 Type 2 diabetes mellitus without complications: Secondary | ICD-10-CM | POA: Diagnosis not present

## 2018-08-21 DIAGNOSIS — H3589 Other specified retinal disorders: Secondary | ICD-10-CM | POA: Diagnosis not present

## 2018-08-21 DIAGNOSIS — Z794 Long term (current) use of insulin: Secondary | ICD-10-CM | POA: Diagnosis not present

## 2018-08-21 DIAGNOSIS — Z961 Presence of intraocular lens: Secondary | ICD-10-CM | POA: Diagnosis not present

## 2018-08-21 DIAGNOSIS — Z7984 Long term (current) use of oral hypoglycemic drugs: Secondary | ICD-10-CM | POA: Diagnosis not present

## 2018-08-24 LAB — HM DIABETES EYE EXAM

## 2018-09-11 DIAGNOSIS — E113293 Type 2 diabetes mellitus with mild nonproliferative diabetic retinopathy without macular edema, bilateral: Secondary | ICD-10-CM | POA: Diagnosis not present

## 2018-09-11 DIAGNOSIS — H31091 Other chorioretinal scars, right eye: Secondary | ICD-10-CM | POA: Diagnosis not present

## 2018-09-11 DIAGNOSIS — H43391 Other vitreous opacities, right eye: Secondary | ICD-10-CM | POA: Diagnosis not present

## 2018-09-11 DIAGNOSIS — H43813 Vitreous degeneration, bilateral: Secondary | ICD-10-CM | POA: Diagnosis not present

## 2018-09-11 LAB — HM DIABETES EYE EXAM

## 2018-09-15 ENCOUNTER — Telehealth: Payer: Self-pay | Admitting: Internal Medicine

## 2018-09-15 NOTE — Telephone Encounter (Signed)
LVM requesting patient call to discuss note below- please advise on dosage for Novolin R

## 2018-09-15 NOTE — Telephone Encounter (Signed)
Discussed with patient, she has enough NovoLog till the end of the year and explained to her that regular insulin may not work as well.  She will look into other options including generic Humalog for next year

## 2018-09-15 NOTE — Telephone Encounter (Signed)
Pt called to inquire about part d insurance requirements. Pt is asking if she could switch insulins to Novolin N at [facility] due to financial reasons.   Please advise [phone]

## 2018-09-15 NOTE — Telephone Encounter (Signed)
Please advise 

## 2018-09-15 NOTE — Telephone Encounter (Signed)
She would need to get Novolin R from [facility] but this will be working a little slower than the other insulin and she will have to let us know if she has difficulties with control

## 2018-10-06 ENCOUNTER — Other Ambulatory Visit: Payer: Self-pay | Admitting: Endocrinology

## 2018-10-19 ENCOUNTER — Other Ambulatory Visit (INDEPENDENT_AMBULATORY_CARE_PROVIDER_SITE_OTHER): Payer: Medicare Other

## 2018-10-19 DIAGNOSIS — E1065 Type 1 diabetes mellitus with hyperglycemia: Secondary | ICD-10-CM | POA: Diagnosis not present

## 2018-10-19 LAB — COMPREHENSIVE METABOLIC PANEL
ALK PHOS: 91 U/L (ref 39–117)
ALT: 22 U/L (ref 0–35)
AST: 19 U/L (ref 0–37)
Albumin: 3.7 g/dL (ref 3.5–5.2)
BILIRUBIN TOTAL: 0.5 mg/dL (ref 0.2–1.2)
BUN: 15 mg/dL (ref 6–23)
CALCIUM: 9 mg/dL (ref 8.4–10.5)
CO2: 26 meq/L (ref 19–32)
Chloride: 103 mEq/L (ref 96–112)
Creatinine, Ser: 0.71 mg/dL (ref 0.40–1.20)
GFR: 86.29 mL/min (ref >60.00)
Glucose, Bld: 119 mg/dL — ABNORMAL HIGH (ref 70–99)
POTASSIUM: 4.3 meq/L (ref 3.5–5.1)
Sodium: 136 mEq/L (ref 135–145)
TOTAL PROTEIN: 6.5 g/dL (ref 6.0–8.3)

## 2018-10-19 LAB — HEMOGLOBIN A1C: Hgb A1c MFr Bld: 8.1 % — ABNORMAL HIGH (ref 4.6–6.5)

## 2018-10-19 MED FILL — SHINGRIX 50 MCG SUS: 50 | 1 days supply | Qty: 1 | Fill #1

## 2018-10-19 NOTE — Progress Notes (Addendum)
Patient ID: [name], female   DOB: 09/11/1946, 72 y.o.   MRN: [id number]   Reason for visit: DIABETES followup.  Diagnosis: Type 1 diabetes mellitus, date of diagnosis: 1979.    HISTORY of present illness:   Insulin Pump: CURRENT brand: Medtronic 630  BASAL settings: Midnight = 0.75,  2 AM  0.6, 4 AM = 1.0, 7 AM = 1.95, 11 AM = 0.8; 2 PM = 1.1, 5 PM = 2.1, 7:30 PM = 3.0 and 10 PM = 1.7.   Total daily insulin = up to 60 units   Carbohydrate ratio 1: 12 breakfast, 1: 10 lunch and 1: 5.5 at dinner; sensitivity midnight = 1:30; 1: 25 from 5 AM-7 AM, 7 AM = 40 ; target 120-130  Insulin on board indicator on, duration 4 hrs   DIABETES: She has had long-standing diabetes with A1c levels usually above target. In 10/13 ACTOS was restarted because of her large insulin requirement and diabetic dyslipidemia.  Her blood sugars improved significantly reduced.  Also has been taking metformin as insulin sensitizer  RECENT history:   Her A1c is still relatively high at 8.1, previously 7.9  Current blood sugar patterns from pump download:   Unable to download her Dexcom today  Her Actos was resumed on her last visit  Although she is entering blood sugars about 5 times a day and her pump difficult to get a consistent pattern  HYPERGLYCEMIA appears to be occurring early morning although not consistently and also usually between about 4-7 PM  POSTPRANDIAL readings are relatively difficult to analyze because of inadequate data but appear to be fairly good at breakfast and about half the time at dinnertime  Yesterday her blood sugar was near normal throughout the day which is unusual and she did not bolus most of the day except for a late lunch after 4 PM and dinner at 8-9 PM  Not clear if she is able to estimate how to bolus for different meals: For lunch yesterday she had a sandwich and for the bread alone she was bolusing 5 units with no other carbohydrate or high fat  intake  At dinnertime she had relatively high fat meat and cheese and her blood sugar progressively got higher into the night even with 13 units of bolus  She is not always doing correction boluses when blood sugars are rising on her sensor  Recently no significant hypoglycemia documented  However she is asking about appropriate treatment of hypoglycemia when she is using glucose tablets but these appear to be working very slowly for her and she will use 1 or 2 tablets for mild hypoglycemia and more than 4 for more severe hypoglycemia  She has not had any history of HYPOGLYCEMIA unawareness and this was reviewed today  She is not finding the time to exercise   Hypoglycemic awareness: Has symptoms of feeling tired, headache, sweaty.  She thinks he can usually recognize symptoms  Sometimes may not be aware during the night and will be notified by her sensor that the sugar is low  She has symptoms when blood glucose is less than 60. Uses glucose tablets for treatment even overnight     PRE-MEAL Fasting Lunch Dinner Bedtime Overall  Glucose range:  115-332  78-238  97-280  83-261  137+/-66  Mean/median:  193  140  118  153    POST-MEAL PC Breakfast PC Lunch PC Dinner  Glucose range:    83-261  Mean/median:  DIET has been usually fairly controlled with carbohydrate and fat intake, tends to eat out on weekends The mealtimes are variable.    Food preferences: Yogurt and cottage cheese for breakfast usually.   Blood sugars with exercise: Previously after exercise blood sugar will get lower in 1-2 hrs but sometimes it may get lower while exercising and she carries glucose tablets with her. .  Wt Readings from Last 3 Encounters:  10/20/18 210 lb (95.3 kg)  07/28/18 207 lb (93.9 kg)  06/24/18 207 lb (93.9 kg)    LABS:  Lab Results  Component Value Date   HGBA1C 8.1 (H) 10/19/2018   HGBA1C 7.9 (A) 07/28/2018   HGBA1C 7.8 (H) 04/07/2018   Lab Results  Component Value Date    MICROALBUR <0.7 01/12/2018   LDLCALC 52 04/07/2018   CREATININE 0.71 10/19/2018    Lab on 10/19/2018  Component Date Value Ref Range Status  . Sodium 10/19/2018 136  135 - 145 mEq/L Final  . Potassium 10/19/2018 4.3  3.5 - 5.1 mEq/L Final  . Chloride 10/19/2018 103  96 - 112 mEq/L Final  . CO2 10/19/2018 26  19 - 32 mEq/L Final  . Glucose, Bld 10/19/2018 119* 70 - 99 mg/dL Final  . BUN 10/19/2018 15  6 - 23 mg/dL Final  . Creatinine, Ser 10/19/2018 0.71  0.40 - 1.20 mg/dL Final  . Total Bilirubin 10/19/2018 0.5  0.2 - 1.2 mg/dL Final  . Alkaline Phosphatase 10/19/2018 91  39 - 117 U/L Final  . AST 10/19/2018 19  0 - 37 U/L Final  . ALT 10/19/2018 22  0 - 35 U/L Final  . Total Protein 10/19/2018 6.5  6.0 - 8.3 g/dL Final  . Albumin 10/19/2018 3.7  3.5 - 5.2 g/dL Final  . Calcium 10/19/2018 9.0  8.4 - 10.5 mg/dL Final  . GFR 10/19/2018 86.29  >60.00 mL/min Final  . Hgb A1c MFr Bld 10/19/2018 8.1* 4.6 - 6.5 % Final   Glycemic Control Guidelines for People with Diabetes:Non Diabetic:  <6%Goal of Therapy: <7%Additional Action Suggested:  >8%     Allergies as of 10/20/2018      Reactions   Sulfa Antibiotics Rash      Medication List       Accurate as of October 20, 2018 11:01 AM. Always use your most recent med list.        aspirin 325 MG EC tablet Take 325 mg by mouth daily.   CALCIUM + D PO Take 600 mg by mouth 2 (two) times daily.   cholecalciferol 1000 units tablet Commonly known as:  VITAMIN D Take 1,000 Units by mouth 2 (two) times daily.   clopidogrel 75 MG tablet Commonly known as:  PLAVIX Take 1 tablet (75 mg total) by mouth daily.   ezetimibe 10 MG tablet Commonly known as:  ZETIA Take 1 tablet (10 mg total) by mouth daily.   furosemide 20 MG tablet Commonly known as:  LASIX Take 20 mg by mouth. Take 1 tablet by mouth as needed for 5 days.   glucose blood test strip Commonly known as:  BAYER CONTOUR TEST Use as instructed to check sugar 6 times  daily. Dx Code E10.65   insulin aspart 100 UNIT/ML injection Commonly known as:  NOVOLOG INJECT SUBCUTANEOUSLY A    MAXIMUM OF 200 UNITS PER   DAY VIA INSULIN PUMP   insulin pump Soln Inject into the skin. Novolog Vial, max 200 units per day with pump   losartan 25  MG tablet Commonly known as:  COZAAR Take 1 tablet (25 mg total) by mouth daily.   metFORMIN 500 MG 24 hr tablet Commonly known as:  GLUCOPHAGE-XR TAKE 2 TABLETS DAILY   metoprolol succinate 100 MG 24 hr tablet Commonly known as:  TOPROL-XL TAKE 1 TABLET (100 MG TOTAL) BY MOUTH DAILY.   niacin 1000 MG CR tablet Commonly known as:  NIASPAN TAKE 1 TABLET BY MOUTH AT BEDTIME   nitroGLYCERIN 0.4 MG SL tablet Commonly known as:  NITROSTAT Place 1 tablet (0.4 mg total) under the tongue every 5 (five) minutes as needed for chest pain.   pioglitazone 15 MG tablet Commonly known as:  ACTOS TAKE 1 TABLET BY MOUTH EVERY DAY   potassium chloride 10 MEQ tablet Commonly known as:  K-DUR Take 1 tablet (10 mEq total) by mouth daily as needed (take with Lasix).   rosuvastatin 40 MG tablet Commonly known as:  CRESTOR Take 1 tablet (40 mg total) by mouth daily.   traMADol 50 MG tablet Commonly known as:  ULTRAM Take 1 tablet (50 mg total) by mouth every 6 (six) hours as needed for severe pain.       Allergies:  Allergies  Allergen Reactions  . Sulfa Antibiotics Rash    Past Medical History:  Diagnosis Date  . Arthritis    OA  . Atrial flutter ([place])    a. s/p TEE/DCCV 08/16/13 (normal LV function, no LAA thrombus).b. s/p ablation by Dr Lovena Le 09-23-2013  . Coronary artery disease    2009 LAD Promus stent  . Dysrhythmia    Aflutter, no issues after ablation 2014  . Fibroid   . Hyperlipidemia   . Insulin pump in place   . Osteopenia   . Type 1 diabetes mellitus on insulin therapy ([place])   . Valvular heart disease    a. Mild MR/TR by TEE 08/2013.    Past Surgical History:  Procedure Laterality Date  .  ABLATION  09-23-2013   CTI by Dr Lovena Le  . APPENDECTOMY    . APPLICATION OF WOUND VAC Left 04/11/2017   Procedure: APPLICATION OF WOUND VAC LEFT KNEE;  Surgeon: Rod Can, MD;  Location: [place];  Service: Orthopedics;  Laterality: Left;  . ATRIAL FLUTTER ABLATION N/A 09/23/2013   Procedure: ATRIAL FLUTTER ABLATION;  Surgeon: Evans Lance, MD;  Location[facility] CATH LAB;  Service: Cardiovascular;  Laterality: N/A;  . CARDIOVERSION N/A 08/16/2013   Procedure: CARDIOVERSION;  Surgeon: Thayer Headings, MD;  Location: [place];  Service: Cardiovascular;  Laterality: N/A;  spoke with [name]  . CATARACT EXTRACTION Bilateral   . COLONOSCOPY    . CORONARY ANGIOPLASTY WITH STENT PLACEMENT    . MYOMECTOMY  1977  . ORIF PATELLA Left 04/11/2017  . ORIF PATELLA Left 04/11/2017   Procedure: OPEN REDUCTION INTERNAL (ORIF) FIXATION PATELLA LEFT KNEE;  Surgeon: Rod Can, MD;  Location: [place];  Service: Orthopedics;  Laterality: Left;  . PELVIC LAPAROSCOPY    . TEE WITHOUT CARDIOVERSION N/A 08/16/2013   Procedure: TRANSESOPHAGEAL ECHOCARDIOGRAM (TEE);  Surgeon: Thayer Headings, MD;  Location: [facility] ENDOSCOPY;  Service: Cardiovascular;  Laterality: N/A;    Family History  Problem Relation Age of Onset  . CAD Father 38       died age 55  . CAD Brother 54       small vessel disease  . Atrial fibrillation Mother   . Diabetes Mother   . Hypertension Mother   . CAD Cousin  paternal  . CAD Paternal Grandfather        early onset  . Colon cancer Neg Hx     Social History:  reports that she has never smoked. She has never used smokeless tobacco. She reports current alcohol use of about 5.0 standard drinks of alcohol per week. She reports that she does not use drugs.  REVIEW of systems:  EDEMA: Less frequent now and may take Lasix only rarely She did have more significant edema in early summer 2019 treated with Lasix  Cardiac function: Her echo shows only grade 1 diastolic dysfunction and  normal systolic function  HYPERTENSION: This is controlled with metoprolol 100 mg and 25 mg losartan She has had atrial arrhythmias treated with metoprolol  BP Readings from Last 3 Encounters:  10/20/18 (!) 150/80  07/28/18 138/82  04/08/18 (!) 142/68     She has had diabetic dyslipidemia with significantly high LDL particle number and this has been managed with Crestor 40 mg and Niaspan 1000 mg In 3/16 her LDL particle number was below 1000   This is now back down to below 900  She is usually taking her niacin regularly with her Crestor Also with adding Zetia in March her LDL is below 70   Lab Results  Component Value Date   CHOL 112 04/07/2018   HDL 49.00 04/07/2018   LDLCALC 52 04/07/2018   LDLDIRECT 79.0 01/11/2015   TRIG 56.0 04/07/2018   CHOLHDL 2 04/07/2018     No recent problems with coronary artery disease   BP (!) 150/80 (BP Location: Left Arm, Patient Position: Sitting, Cuff Size: Normal)   Pulse 62   Ht 5' 9.5" (1.765 m)   Wt 210 lb (95.3 kg)   SpO2 98%   BMI 30.57 kg/m    ASSESSMENT/PLAN:  DIABETES type 1 with inadequate control on Medtronic insulin pump  Her A1c continues to be above 7% and now 8.1  See history of present illness for detailed discussion of her current blood sugar patterns and management with her pump  Not able to download her Dexcom and may be related to her having a relatively old device  Her blood sugars do not show any consistent pattern but tend to be high early morning and late afternoon No significant hypoglycemia documented recently Today discussed in detail the adjustment of her boluses for different types of meals including split boluses and extra insulin for higher fat meals Also she will need to count her carbohydrates more accurately at all meals  Hypoglycemia treatment was reviewed in detail Discussed that she can try to use a juice or regular soft drinks to see if this works quicker for treatment compared to  glucose tablets  Recommendations: Basal rate change will be from 3:30 PM increased up to 2.1 May consider changing her sensitivity on the next visit if she is still not getting adequate correction of high readings We will review postprandial readings with sensor data on the next visit also Resume exercise, given ideas about doing indoor exercises To call if she is having consistently abnormal patterns overnight or in the evenings She will need to try and get the G6 Dexcom sensor  History of EDEMA: Minimal now and only occasionally needing Lasix  LIPIDS: Needs follow-up on the next visit   Counseling time on subjects discussed in assessment and plan sections is over 50% of today's 25 minute visit  There are no Patient Instructions on file for this visit.   Elayne Snare 10/20/18  Note: This office note was prepared with Estate agent. Any transcriptional errors that result from this process are unintentional.

## 2018-10-20 ENCOUNTER — Ambulatory Visit (INDEPENDENT_AMBULATORY_CARE_PROVIDER_SITE_OTHER): Payer: Medicare Other | Admitting: Endocrinology

## 2018-10-20 ENCOUNTER — Encounter: Payer: Self-pay | Admitting: Endocrinology

## 2018-10-20 VITALS — BP 150/80 | HR 62 | Ht 69.5 in | Wt 210.0 lb

## 2018-10-20 DIAGNOSIS — E782 Mixed hyperlipidemia: Secondary | ICD-10-CM

## 2018-10-20 DIAGNOSIS — I2510 Atherosclerotic heart disease of native coronary artery without angina pectoris: Secondary | ICD-10-CM

## 2018-10-20 DIAGNOSIS — Z8679 Personal history of other diseases of the circulatory system: Secondary | ICD-10-CM | POA: Diagnosis not present

## 2018-10-20 DIAGNOSIS — E1065 Type 1 diabetes mellitus with hyperglycemia: Secondary | ICD-10-CM

## 2018-10-20 NOTE — Patient Instructions (Signed)
INDOOR EXERCISE IDEAS   Use the following examples for a creative indoor workout (perform each move for 2-3 minutes):   Warm up. Put on some music that makes you feel like moving, and dance around the living room.  Watch exercise shows on TV and move along with them. There are tons of free cable channels that have daily exercise shows on them for all levels - beginner through advanced.   You can easily find a number of exercise videos but use one that will suit your liking and exercise level; you can do these on your own schedule.  Walk up and down the steps.  Do dumbbell curls and presses (if you don't have weights, use full water bottles).  Do assisted squats, keeping your back on a fitness ball against the wall or using the back of the couch for support.  Shadow box: Lift and lower the left leg; jab with the right arm, then the left; then lift and lower the right leg.  Fence (you don't even need swords). Pretend you're holding a sword in each hand. Create an X pattern standing still, then moving forward and back.  Hop on your exercise bike or treadmill -- or, for something different, use a weighted hula hoop. If you don't have any of those, just go back to dancing.  Do abdominal crunches (hold a weighted ball for added resistance).  Cool down with [employer] "I Feel Good" -- or whatever tune makes you feel good

## 2018-11-17 ENCOUNTER — Encounter: Payer: Self-pay | Admitting: Cardiovascular Disease

## 2018-11-17 ENCOUNTER — Ambulatory Visit (INDEPENDENT_AMBULATORY_CARE_PROVIDER_SITE_OTHER): Payer: Medicare Other | Admitting: Cardiovascular Disease

## 2018-11-17 VITALS — BP 134/70 | HR 59 | Ht 69.5 in | Wt 206.6 lb

## 2018-11-17 DIAGNOSIS — E785 Hyperlipidemia, unspecified: Secondary | ICD-10-CM | POA: Diagnosis not present

## 2018-11-17 DIAGNOSIS — I2510 Atherosclerotic heart disease of native coronary artery without angina pectoris: Secondary | ICD-10-CM

## 2018-11-17 LAB — BASIC METABOLIC PANEL
BUN/Creatinine Ratio: 15 (ref 12–28)
BUN: 13 mg/dL (ref 8–27)
CO2: 27 mmol/L (ref 20–29)
Calcium: 10.6 mg/dL — ABNORMAL HIGH (ref 8.7–10.3)
Chloride: 98 mmol/L (ref 96–106)
Creatinine, Ser: 0.86 mg/dL (ref 0.57–1.00)
GFR calc Af Amer: 79 mL/min/{1.73_m2} (ref >59)
GFR calc non Af Amer: 69 mL/min/{1.73_m2} (ref >59)
Glucose: 157 mg/dL — ABNORMAL HIGH (ref 65–99)
POTASSIUM: 4.3 mmol/L (ref 3.5–5.2)
Sodium: 137 mmol/L (ref 134–144)

## 2018-11-17 LAB — HEPATIC FUNCTION PANEL
ALT: 38 IU/L — ABNORMAL HIGH (ref 0–32)
AST: 29 IU/L (ref 0–40)
Albumin: 4.1 g/dL (ref 3.5–4.8)
Alkaline Phosphatase: 99 IU/L (ref 39–117)
Bilirubin Total: 0.5 mg/dL (ref 0.0–1.2)
Bilirubin, Direct: 0.17 mg/dL (ref 0.00–0.40)
Total Protein: 6.5 g/dL (ref 6.0–8.5)

## 2018-11-17 LAB — LIPID PANEL
Chol/HDL Ratio: 2.3 ratio (ref 0.0–4.4)
Cholesterol, Total: 100 mg/dL (ref 100–199)
HDL: 44 mg/dL (ref >39)
LDL Calculated: 43 mg/dL (ref 0–99)
Triglycerides: 66 mg/dL (ref 0–149)
VLDL Cholesterol Cal: 13 mg/dL (ref 5–40)

## 2018-11-17 NOTE — Patient Instructions (Signed)
Medication Instructions:  Your physician recommends that you continue on your current medications as directed. Please refer to the Current Medication list given to you today.  If you need a refill on your cardiac medications before your next appointment, please call your pharmacy.    Lab work: TODAY - cholesterol, liver panel, basic metabolic panel  If you have labs (blood work) drawn today and your tests are completely normal, you will receive your results only by: . MyChart Message (if you have MyChart) OR . A paper copy in the mail If you have any lab test that is abnormal or we need to change your treatment, we will call you to review the results.   Testing/Procedures: None Ordered   Follow-Up: At [facility], you and your health needs are our priority.  As part of our continuing mission to provide you with exceptional heart care, we have created designated Provider Care Teams.  These Care Teams include your primary Cardiologist (physician) and Advanced Practice Providers (APPs -  Physician Assistants and Nurse Practitioners) who all work together to provide you with the care you need, when you need it. You will need a follow up appointment in:  6 months.  Please call our office 2 months in advance to schedule this appointment.  You may see Philip [name], MD or one of the following Advanced Practice Providers on your designated Care Team: Scott Weaver, PA-C Vin Bhagat, PA-C . Janine Hammond, NP   

## 2018-11-17 NOTE — Progress Notes (Signed)
[name] Date of Birth  25-Oct-1946 [facility]          [address]    [place]     [place], [place]  [address]           Problems. 1. CAD 2. Atrial Flutter - s/p Aflutter ablation Nov. 2014.  3. Diabetes Mellitus.      [name] is doing very well. She's not having episodes of chest pain or shortness of breath. She's been able to below of her normal activities without any significant problems. She has not had chest pains .  Her insurance will run out the month before she goes on Medicare.   She has not had any CP and no palpitations.    April 08, 2013:   [name] is doing well. No CP.  Rhythm is stable.     Mar 11, 2014:  [name] is doing well.  She had a atrial flutter ablation several months ago.  She has not been taking her metoprolol..  Nov. 9, 2015:  [name] is doing well. No Cp , no dyspnea.  Active, not exerciseing as much as she would like.  Mar 16, 2015:   [name] is doing well.   Doing well from a cardiac standpoint.   Nov. 10, 2016  Lots of stress - her 26 yo mom is living with her.  Will not let anyone else do anything Lots of stress No CP , no arrhythmias No time to exercise   Mar 20, 2016:  Doing well Her mother is living back in [place].   Stress is a bit less.  Exercising some .   Nov. 22, 2017.  Doing well . Had afib ablation several years ago and feels great.  DM is well controlled.  Has UTIs frequently .   November 17, 2016: Seen back today for follow-up of her coronary artery disease. Doing well Falls occasionally ,  Broke her left kneecap and left ankle this past year.  Takes the niacin at night and the ASA 81 mg does not conrol the niacin flushng like the 325 mg niacin does.   Jan. 14, 2020  [name] is seen today . No CP or dyspnea  Exercising some.   Diabetes is well controlled.     Current Outpatient Medications on File Prior to Visit  Medication Sig Dispense Refill  . aspirin 325 MG EC tablet Take 325 mg by  mouth daily.    . Calcium Carbonate-Vitamin D (CALCIUM + D PO) Take 600 mg by mouth 2 (two) times daily.     . cholecalciferol (VITAMIN D) 1000 units tablet Take 1,000 Units by mouth 2 (two) times daily.     . clopidogrel (PLAVIX) 75 MG tablet Take 1 tablet (75 mg total) by mouth daily. 90 tablet 1  . ezetimibe (ZETIA) 10 MG tablet Take 1 tablet (10 mg total) by mouth daily. 90 tablet 1  . furosemide (LASIX) 20 MG tablet Take 20 mg by mouth. Take 1 tablet by mouth as needed for 5 days.    Marland Kitchen glucose blood (BAYER CONTOUR TEST) test strip Use as instructed to check sugar 6 times daily. Dx Code E10.65 550 each 3  . Insulin Aspart (INSULIN PUMP) 100 unit/ml SOLN Inject into the skin. Novolog Vial, max 200 units per day with pump    . insulin aspart (NOVOLOG) 100 UNIT/ML injection INJECT SUBCUTANEOUSLY A    MAXIMUM OF 200 UNITS PER   DAY VIA INSULIN PUMP 90  mL 1  . losartan (COZAAR) 25 MG tablet Take 1 tablet (25 mg total) by mouth daily. 90 tablet 3  . metFORMIN (GLUCOPHAGE-XR) 500 MG 24 hr tablet Take 500 mg by mouth as directed. 1 tablet in the AM and 2 tablets in the PM    . metoprolol succinate (TOPROL-XL) 100 MG 24 hr tablet TAKE 1 TABLET (100 MG TOTAL) BY MOUTH DAILY. 90 tablet 3  . niacin (NIASPAN) 1000 MG CR tablet TAKE 1 TABLET BY MOUTH AT BEDTIME 30 tablet 1  . nitroGLYCERIN (NITROSTAT) 0.4 MG SL tablet Place 1 tablet (0.4 mg total) under the tongue every 5 (five) minutes as needed for chest pain. 25 tablet 1  . pioglitazone (ACTOS) 15 MG tablet TAKE 1 TABLET BY MOUTH EVERY DAY 30 tablet 5  . rosuvastatin (CRESTOR) 40 MG tablet Take 1 tablet (40 mg total) by mouth daily. 90 tablet 1  . potassium chloride (K-DUR) 10 MEQ tablet Take 1 tablet (10 mEq total) by mouth daily as needed (take with Lasix). 90 tablet 3   No current facility-administered medications on file prior to visit.     Allergies  Allergen Reactions  . Sulfa Antibiotics Rash    Past Medical History:  Diagnosis Date  .  Arthritis    OA  . Atrial flutter ([place])    a. s/p TEE/DCCV 08/16/13 (normal LV function, no LAA thrombus).b. s/p ablation by Dr Lovena Le 09-23-2013  . Coronary artery disease    2009 LAD Promus stent  . Dysrhythmia    Aflutter, no issues after ablation 2014  . Fibroid   . Hyperlipidemia   . Insulin pump in place   . Osteopenia   . Type 1 diabetes mellitus on insulin therapy ([place])   . Valvular heart disease    a. Mild MR/TR by TEE 08/2013.    Past Surgical History:  Procedure Laterality Date  . ABLATION  09-23-2013   CTI by Dr Lovena Le  . APPENDECTOMY    . APPLICATION OF WOUND VAC Left 04/11/2017   Procedure: APPLICATION OF WOUND VAC LEFT KNEE;  Surgeon: Rod Can, MD;  Location: [place];  Service: Orthopedics;  Laterality: Left;  . ATRIAL FLUTTER ABLATION N/A 09/23/2013   Procedure: ATRIAL FLUTTER ABLATION;  Surgeon: Evans Lance, MD;  Location: [facility] CATH LAB;  Service: Cardiovascular;  Laterality: N/A;  . CARDIOVERSION N/A 08/16/2013   Procedure: CARDIOVERSION;  Surgeon: Thayer Headings, MD;  Location: [place];  Service: Cardiovascular;  Laterality: N/A;  spoke with [name]  . CATARACT EXTRACTION Bilateral   . COLONOSCOPY    . CORONARY ANGIOPLASTY WITH STENT PLACEMENT    . MYOMECTOMY  1977  . ORIF PATELLA Left 04/11/2017  . ORIF PATELLA Left 04/11/2017   Procedure: OPEN REDUCTION INTERNAL (ORIF) FIXATION PATELLA LEFT KNEE;  Surgeon: Rod Can, MD;  Location: [place];  Service: Orthopedics;  Laterality: Left;  . PELVIC LAPAROSCOPY    . TEE WITHOUT CARDIOVERSION N/A 08/16/2013   Procedure: TRANSESOPHAGEAL ECHOCARDIOGRAM (TEE);  Surgeon: Thayer Headings, MD;  Location: [facility] ENDOSCOPY;  Service: Cardiovascular;  Laterality: N/A;    Social History   Tobacco Use  Smoking Status Never Smoker  Smokeless Tobacco Never Used    Social History   Substance and Sexual Activity  Alcohol Use Yes  . Alcohol/week: 5.0 standard drinks  . Types: 5 Glasses of wine per week   Comment:  on weekends    Family History  Problem Relation Age of Onset  . CAD Father 20  died age 43  . CAD Brother 64       small vessel disease  . Atrial fibrillation Mother   . Diabetes Mother   . Hypertension Mother   . CAD Cousin        paternal  . CAD Paternal Grandfather        early onset  . Colon cancer Neg Hx     Reviw of Systems:  Reviewed in the HPI.  All other systems are negative.  Physical Exam: Blood pressure 134/70, pulse (!) 59, height 5' 9.5" (1.765 m), weight 206 lb 9.6 oz (93.7 kg).  GEN:  Well nourished, well developed in no acute distress HEENT: Normal NECK: No JVD; No carotid bruits LYMPHATICS: No lymphadenopathy CARDIAC: RRR , no murmurs, rubs, gallops RESPIRATORY:  Clear to auscultation without rales, wheezing or rhonchi  ABDOMEN: Soft, non-tender, non-distended MUSCULOSKELETAL:  No edema; No deformity  SKIN: Warm and dry NEUROLOGIC:  Alert and oriented x 3    ECG:  Jan. 14, 2020> sinus bradycardia 59 beats minute.  Nonspecific ST and T wave changes.  Assessment / Plan:   1. CAD -she seems to be doing well.  She is not had any episodes of chest pain or shortness of breath.  2. Atrial Flutter -status post atrial flutter ablation in 2014.  No evidence of recurrent atrial flutter at this point.  3. Diabetes Mellitus.  Diabetes is been fairly well controlled.  Continue current medications.  4. Hperlipidemia: To new medications.  Continue diet and exercise.    Mertie Moores, MD  11/17/2018 9:40 AM    [address],  [place], [place]  [address] Pager [phone] Phone: [phone]; Fax: [phone]

## 2018-12-08 ENCOUNTER — Other Ambulatory Visit: Payer: Self-pay

## 2018-12-08 MED ORDER — ROSUVASTATIN CALCIUM 40 MG PO TABS: 40.0000 mg | ORAL_TABLET | Freq: Every day | ORAL | 1 refills | Status: DC

## 2018-12-08 MED ORDER — NIACIN ER (ANTIHYPERLIPIDEMIC) 1000 MG PO TBCR: 1000.0000 mg | EXTENDED_RELEASE_TABLET | Freq: Every day | ORAL | 1 refills | Status: DC

## 2018-12-08 MED ORDER — METFORMIN HCL ER 500 MG PO TB24: 500.0000 mg | ORAL_TABLET | ORAL | 1 refills | Status: DC

## 2018-12-08 MED ORDER — EZETIMIBE 10 MG PO TABS: 10.0000 mg | ORAL_TABLET | Freq: Every day | ORAL | 1 refills | Status: DC

## 2018-12-08 MED ORDER — PIOGLITAZONE HCL 15 MG PO TABS: 15.0000 mg | ORAL_TABLET | Freq: Every day | ORAL | 1 refills | Status: DC

## 2018-12-08 MED ORDER — CLOPIDOGREL BISULFATE 75 MG PO TABS: 75.0000 mg | ORAL_TABLET | Freq: Every day | ORAL | 1 refills | Status: DC

## 2018-12-09 MED ORDER — NITROGLYCERIN 0.4 MG SL SUBL: 0.4000 mg | SUBLINGUAL_TABLET | SUBLINGUAL | 2 refills | Status: DC | PRN

## 2018-12-09 MED ORDER — METOPROLOL SUCCINATE ER 100 MG PO TB24: ORAL_TABLET | ORAL | 3 refills | Status: DC

## 2018-12-09 MED ORDER — LOSARTAN POTASSIUM 25 MG PO TABS: 25.0000 mg | ORAL_TABLET | Freq: Every day | ORAL | 3 refills | Status: DC

## 2018-12-09 NOTE — Telephone Encounter (Signed)
Pt's medications were sent to pt's pharmacy as requested. Confirmation received.  

## 2018-12-15 DIAGNOSIS — Z961 Presence of intraocular lens: Secondary | ICD-10-CM | POA: Diagnosis not present

## 2018-12-15 DIAGNOSIS — H02834 Dermatochalasis of left upper eyelid: Secondary | ICD-10-CM | POA: Diagnosis not present

## 2018-12-15 DIAGNOSIS — H02831 Dermatochalasis of right upper eyelid: Secondary | ICD-10-CM | POA: Diagnosis not present

## 2018-12-21 ENCOUNTER — Other Ambulatory Visit: Payer: Medicare Other

## 2019-01-18 ENCOUNTER — Ambulatory Visit: Payer: Medicare Other | Admitting: Internal Medicine

## 2019-01-19 ENCOUNTER — Encounter: Payer: Self-pay | Admitting: Internal Medicine

## 2019-01-19 ENCOUNTER — Other Ambulatory Visit (INDEPENDENT_AMBULATORY_CARE_PROVIDER_SITE_OTHER): Payer: Medicare Other

## 2019-01-19 ENCOUNTER — Ambulatory Visit (INDEPENDENT_AMBULATORY_CARE_PROVIDER_SITE_OTHER): Payer: Medicare Other | Admitting: Internal Medicine

## 2019-01-19 ENCOUNTER — Other Ambulatory Visit: Payer: Self-pay

## 2019-01-19 VITALS — BP 130/70 | HR 63 | Temp 97.4°F | Ht 69.5 in | Wt 209.0 lb

## 2019-01-19 DIAGNOSIS — E1065 Type 1 diabetes mellitus with hyperglycemia: Secondary | ICD-10-CM | POA: Diagnosis not present

## 2019-01-19 DIAGNOSIS — Z0000 Encounter for general adult medical examination without abnormal findings: Secondary | ICD-10-CM

## 2019-01-19 DIAGNOSIS — R32 Unspecified urinary incontinence: Secondary | ICD-10-CM | POA: Insufficient documentation

## 2019-01-19 DIAGNOSIS — I483 Typical atrial flutter: Secondary | ICD-10-CM | POA: Diagnosis not present

## 2019-01-19 DIAGNOSIS — I2510 Atherosclerotic heart disease of native coronary artery without angina pectoris: Secondary | ICD-10-CM

## 2019-01-19 DIAGNOSIS — N393 Stress incontinence (female) (male): Secondary | ICD-10-CM

## 2019-01-19 DIAGNOSIS — E782 Mixed hyperlipidemia: Secondary | ICD-10-CM

## 2019-01-19 DIAGNOSIS — Z8679 Personal history of other diseases of the circulatory system: Secondary | ICD-10-CM

## 2019-01-19 DIAGNOSIS — M1990 Unspecified osteoarthritis, unspecified site: Secondary | ICD-10-CM | POA: Diagnosis not present

## 2019-01-19 LAB — HEMOGLOBIN A1C: Hgb A1c MFr Bld: 8 % — ABNORMAL HIGH (ref 4.6–6.5)

## 2019-01-19 LAB — LIPID PANEL
Cholesterol: 115 mg/dL (ref 0–200)
HDL: 46.8 mg/dL (ref >39.00)
LDL Cholesterol: 54 mg/dL (ref 0–99)
NonHDL: 68.48
Total CHOL/HDL Ratio: 2
Triglycerides: 72 mg/dL (ref 0.0–149.0)
VLDL: 14.4 mg/dL (ref 0.0–40.0)

## 2019-01-19 LAB — COMPREHENSIVE METABOLIC PANEL WITH GFR
ALT: 44 U/L — ABNORMAL HIGH (ref 0–35)
AST: 32 U/L (ref 0–37)
Albumin: 3.7 g/dL (ref 3.5–5.2)
Alkaline Phosphatase: 96 U/L (ref 39–117)
BUN: 14 mg/dL (ref 6–23)
CO2: 31 meq/L (ref 19–32)
Calcium: 9.8 mg/dL (ref 8.4–10.5)
Chloride: 104 meq/L (ref 96–112)
Creatinine, Ser: 0.74 mg/dL (ref 0.40–1.20)
GFR: 77.34 mL/min (ref >60.00)
Glucose, Bld: 133 mg/dL — ABNORMAL HIGH (ref 70–99)
Potassium: 4.3 meq/L (ref 3.5–5.1)
Sodium: 140 meq/L (ref 135–145)
Total Bilirubin: 0.4 mg/dL (ref 0.2–1.2)
Total Protein: 6.5 g/dL (ref 6.0–8.3)

## 2019-01-19 LAB — MICROALBUMIN / CREATININE URINE RATIO
Creatinine,U: 59 mg/dL
Microalb Creat Ratio: 1.2 mg/g (ref 0.0–30.0)
Microalb, Ur: <0.7 mg/dL (ref 0.0–1.9)

## 2019-01-19 LAB — TSH: TSH: 1.86 u[IU]/mL (ref 0.35–4.50)

## 2019-01-19 MED ORDER — MIRABEGRON ER 50 MG PO TB24: 50.0000 mg | ORAL_TABLET | Freq: Every day | ORAL | 3 refills | Status: DC

## 2019-01-19 MED ORDER — DICLOFENAC SODIUM 1 % TD GEL: 4.0000 g | Freq: Four times a day (QID) | TRANSDERMAL | 11 refills | Status: DC

## 2019-01-19 NOTE — Assessment & Plan Note (Signed)
Taking metoprolol and heart rate controlled. Denies recent palpitations and recent EKG fine with cardiology.

## 2019-01-19 NOTE — Assessment & Plan Note (Signed)
Flu shot up to date. Pneumonia complete. Shingrix complete. Tetanus up to date. Colonoscopy up to date. Mammogram up to date, pap smear aged out and dexa up to date. Counseled about sun safety and mole surveillance. Counseled about the dangers of distracted driving. Given 10 year screening recommendations.   

## 2019-01-19 NOTE — Assessment & Plan Note (Signed)
Rx for myrbetriq to try for this.

## 2019-01-19 NOTE — Assessment & Plan Note (Signed)
Rx for diclofenac gel for pain and advised to decrease advil if able. Kidney function reviewed and stable as well as CBC and no signs of GI bleeding.

## 2019-01-19 NOTE — Patient Instructions (Addendum)
We have sent in myrbetriq to take for the bladder 1 pill daily for 3-4 weeks to see if this helps.   We have sent in voltaren gel to use on the hands for the arthritis up to 4 times per day.   Health Maintenance, Female Adopting a healthy lifestyle and getting preventive care can go a long way to promote health and wellness. Talk with your health care provider about what schedule of regular examinations is right for you. This is a good chance for you to check in with your provider about disease prevention and staying healthy. In between checkups, there are plenty of things you can do on your own. Experts have done a lot of research about which lifestyle changes and preventive measures are most likely to keep you healthy. Ask your health care provider for more information. Weight and diet Eat a healthy diet  Be sure to include plenty of vegetables, fruits, low-fat dairy products, and lean protein.  Do not eat a lot of foods high in solid fats, added sugars, or salt.  Get regular exercise. This is one of the most important things you can do for your health. ? Most adults should exercise for at least 150 minutes each week. The exercise should increase your heart rate and make you sweat (moderate-intensity exercise). ? Most adults should also do strengthening exercises at least twice a week. This is in addition to the moderate-intensity exercise. Maintain a healthy weight  Body mass index (BMI) is a measurement that can be used to identify possible weight problems. It estimates body fat based on height and weight. Your health care provider can help determine your BMI and help you achieve or maintain a healthy weight.  For females 64 years of age and older: ? A BMI below 18.5 is considered underweight. ? A BMI of 18.5 to 24.9 is normal. ? A BMI of 25 to 29.9 is considered overweight. ? A BMI of 30 and above is considered obese. Watch levels of cholesterol and blood lipids  You should start  having your blood tested for lipids and cholesterol at 71 years of age, then have this test every 5 years.  You may need to have your cholesterol levels checked more often if: ? Your lipid or cholesterol levels are high. ? You are older than 70 years of age. ? You are at high risk for heart disease. Cancer screening Lung Cancer  Lung cancer screening is recommended for adults 46-70 years old who are at high risk for lung cancer because of a history of smoking.  A yearly low-dose CT scan of the lungs is recommended for people who: ? Currently smoke. ? Have quit within the past 15 years. ? Have at least a 30-pack-year history of smoking. A pack year is smoking an average of one pack of cigarettes a day for 1 year.  Yearly screening should continue until it has been 15 years since you quit.  Yearly screening should stop if you develop a health problem that would prevent you from having lung cancer treatment. Breast Cancer  Practice breast self-awareness. This means understanding how your breasts normally appear and feel.  It also means doing regular breast self-exams. Let your health care provider know about any changes, no matter how small.  If you are in your 20s or 30s, you should have a clinical breast exam (CBE) by a health care provider every 1-3 years as part of a regular health exam.  If you are 40 or  older, have a CBE every year. Also consider having a breast X-ray (mammogram) every year.  If you have a family history of breast cancer, talk to your health care provider about genetic screening.  If you are at high risk for breast cancer, talk to your health care provider about having an MRI and a mammogram every year.  Breast cancer gene (BRCA) assessment is recommended for women who have family members with BRCA-related cancers. BRCA-related cancers include: ? Breast. ? Ovarian. ? Tubal. ? Peritoneal cancers.  Results of the assessment will determine the need for genetic  counseling and BRCA1 and BRCA2 testing. Cervical Cancer Your health care provider may recommend that you be screened regularly for cancer of the pelvic organs (ovaries, uterus, and vagina). This screening involves a pelvic examination, including checking for microscopic changes to the surface of your cervix (Pap test). You may be encouraged to have this screening done every 3 years, beginning at age 23.  For women ages 68-65, health care providers may recommend pelvic exams and Pap testing every 3 years, or they may recommend the Pap and pelvic exam, combined with testing for human papilloma virus (HPV), every 5 years. Some types of HPV increase your risk of cervical cancer. Testing for HPV may also be done on women of any age with unclear Pap test results.  Other health care providers may not recommend any screening for nonpregnant women who are considered low risk for pelvic cancer and who do not have symptoms. Ask your health care provider if a screening pelvic exam is right for you.  If you have had past treatment for cervical cancer or a condition that could lead to cancer, you need Pap tests and screening for cancer for at least 20 years after your treatment. If Pap tests have been discontinued, your risk factors (such as having a new sexual partner) need to be reassessed to determine if screening should resume. Some women have medical problems that increase the chance of getting cervical cancer. In these cases, your health care provider may recommend more frequent screening and Pap tests. Colorectal Cancer  This type of cancer can be detected and often prevented.  Routine colorectal cancer screening usually begins at 71 years of age and continues through 71 years of age.  Your health care provider may recommend screening at an earlier age if you have risk factors for colon cancer.  Your health care provider may also recommend using home test kits to check for hidden blood in the stool.  A  small camera at the end of a tube can be used to examine your colon directly (sigmoidoscopy or colonoscopy). This is done to check for the earliest forms of colorectal cancer.  Routine screening usually begins at age 48.  Direct examination of the colon should be repeated every 5-10 years through 71 years of age. However, you may need to be screened more often if early forms of precancerous polyps or small growths are found. Skin Cancer  Check your skin from head to toe regularly.  Tell your health care provider about any new moles or changes in moles, especially if there is a change in a mole's shape or color.  Also tell your health care provider if you have a mole that is larger than the size of a pencil eraser.  Always use sunscreen. Apply sunscreen liberally and repeatedly throughout the day.  Protect yourself by wearing long sleeves, pants, a wide-brimmed hat, and sunglasses whenever you are outside. Heart disease, diabetes,  and high blood pressure  High blood pressure causes heart disease and increases the risk of stroke. High blood pressure is more likely to develop in: ? People who have blood pressure in the high end of the normal range (130-139/85-89 mm Hg). ? People who are overweight or obese. ? People who are African American.  If you are 28-54 years of age, have your blood pressure checked every 3-5 years. If you are 50 years of age or older, have your blood pressure checked every year. You should have your blood pressure measured twice-once when you are at a hospital or clinic, and once when you are not at a hospital or clinic. Record the average of the two measurements. To check your blood pressure when you are not at a hospital or clinic, you can use: ? An automated blood pressure machine at a pharmacy. ? A home blood pressure monitor.  If you are between 72 years and 36 years old, ask your health care provider if you should take aspirin to prevent strokes.  Have regular  diabetes screenings. This involves taking a blood sample to check your fasting blood sugar level. ? If you are at a normal weight and have a low risk for diabetes, have this test once every three years after 70 years of age. ? If you are overweight and have a high risk for diabetes, consider being tested at a younger age or more often. Preventing infection Hepatitis B  If you have a higher risk for hepatitis B, you should be screened for this virus. You are considered at high risk for hepatitis B if: ? You were born in a country where hepatitis B is common. Ask your health care provider which countries are considered high risk. ? Your parents were born in a high-risk country, and you have not been immunized against hepatitis B (hepatitis B vaccine). ? You have HIV or AIDS. ? You use needles to inject street drugs. ? You live with someone who has hepatitis B. ? You have had sex with someone who has hepatitis B. ? You get hemodialysis treatment. ? You take certain medicines for conditions, including cancer, organ transplantation, and autoimmune conditions. Hepatitis C  Blood testing is recommended for: ? Everyone born from [birth year] through 1963. ? Anyone with known risk factors for hepatitis C. Sexually transmitted infections (STIs)  You should be screened for sexually transmitted infections (STIs) including gonorrhea and chlamydia if: ? You are sexually active and are younger than 71 years of age. ? You are older than 71 years of age and your health care provider tells you that you are at risk for this type of infection. ? Your sexual activity has changed since you were last screened and you are at an increased risk for chlamydia or gonorrhea. Ask your health care provider if you are at risk.  If you do not have HIV, but are at risk, it may be recommended that you take a prescription medicine daily to prevent HIV infection. This is called pre-exposure prophylaxis (PrEP). You are considered at  risk if: ? You are sexually active and do not regularly use condoms or know the HIV status of your partner(s). ? You take drugs by injection. ? You are sexually active with a partner who has HIV. Talk with your health care provider about whether you are at high risk of being infected with HIV. If you choose to begin PrEP, you should first be tested for HIV. You should then be tested every  3 months for as long as you are taking PrEP. Pregnancy  If you are premenopausal and you may become pregnant, ask your health care provider about preconception counseling.  If you may become pregnant, take 400 to 800 micrograms (mcg) of folic acid every day.  If you want to prevent pregnancy, talk to your health care provider about birth control (contraception). Osteoporosis and menopause  Osteoporosis is a disease in which the bones lose minerals and strength with aging. This can result in serious bone fractures. Your risk for osteoporosis can be identified using a bone density scan.  If you are 105 years of age or older, or if you are at risk for osteoporosis and fractures, ask your health care provider if you should be screened.  Ask your health care provider whether you should take a calcium or vitamin D supplement to lower your risk for osteoporosis.  Menopause may have certain physical symptoms and risks.  Hormone replacement therapy may reduce some of these symptoms and risks. Talk to your health care provider about whether hormone replacement therapy is right for you. Follow these instructions at home:  Schedule regular health, dental, and eye exams.  Stay current with your immunizations.  Do not use any tobacco products including cigarettes, chewing tobacco, or electronic cigarettes.  If you are pregnant, do not drink alcohol.  If you are breastfeeding, limit how much and how often you drink alcohol.  Limit alcohol intake to no more than 1 drink per day for nonpregnant women. One drink  equals 12 ounces of beer, 5 ounces of wine, or 1 ounces of hard liquor.  Do not use street drugs.  Do not share needles.  Ask your health care provider for help if you need support or information about quitting drugs.  Tell your health care provider if you often feel depressed.  Tell your health care provider if you have ever been abused or do not feel safe at home. This information is not intended to replace advice given to you by your health care provider. Make sure you discuss any questions you have with your health care provider. Document Released: 05/06/2011 Document Revised: 03/28/2016 Document Reviewed: 07/25/2015 Elsevier Interactive Patient Education  2019 [employer].

## 2019-01-19 NOTE — Progress Notes (Signed)
   Subjective:   Patient ID: [name], female    DOB: 16-Nov-1945, 73 y.o.   MRN: [id number]  HPI Here for medicare wellness, no new complaints. Please see A/P for status and treatment of chronic medical problems.   HPI #2: Here for worsening urinary incontinence (having to get up more at night to urinate, leaking when having to go urgently, denies pain with urination, denies fevers or chills, denies abdominal pain), and arthritis in hands (she is using tylenol and advil and worries that she is using advil too often, wants to know about other options), and blood pressure (taking metoprolol and losartan and lasix, denies side effects, denies chest pains or headaches).   Diet: DM since diabetic Physical activity: active Depression/mood screen: negative Hearing: intact to whispered voice Visual acuity: grossly normal, performs annual eye exam  ADLs: capable Fall risk: none Home safety: good Cognitive evaluation: intact to orientation, naming, recall and repetition EOL planning: adv directives discussed    Nutrition from 04/20/2018 in [facility]  PHQ-2 Total Score  0      I have personally reviewed and have noted 1. The patient's medical and social history - reviewed today no changes 2. Their use of alcohol, tobacco or illicit drugs 3. Their current medications and supplements 4. The patient's functional ability including ADL's, fall risks, home safety risks and hearing or visual impairment. 5. Diet and physical activities 6. Evidence for depression or mood disorders 7. Care team reviewed and updated (available in snapshot)  Review of Systems  Constitutional: Negative.   HENT: Negative.   Eyes: Negative.   Respiratory: Negative for cough, chest tightness and shortness of breath.   Cardiovascular: Negative for chest pain, palpitations and leg swelling.  Gastrointestinal: Negative for abdominal distention, abdominal pain, constipation, diarrhea,  nausea and vomiting.  Genitourinary: Positive for frequency and urgency.  Musculoskeletal: Positive for arthralgias and myalgias.  Skin: Negative.   Neurological: Negative.   Psychiatric/Behavioral: Negative.     Objective:  Physical Exam Constitutional:      Appearance: She is well-developed.  HENT:     Head: Normocephalic and atraumatic.  Neck:     Musculoskeletal: Normal range of motion.  Cardiovascular:     Rate and Rhythm: Normal rate and regular rhythm.  Pulmonary:     Effort: Pulmonary effort is normal. No respiratory distress.     Breath sounds: Normal breath sounds. No wheezing or rales.  Abdominal:     General: Bowel sounds are normal. There is no distension.     Palpations: Abdomen is soft.     Tenderness: There is no abdominal tenderness. There is no rebound.  Musculoskeletal:        General: Tenderness present.  Skin:    General: Skin is warm and dry.  Neurological:     Mental Status: She is alert and oriented to person, place, and time.     Coordination: Coordination normal.     Vitals:   01/19/19 0959  BP: 130/70  Pulse: 63  Temp: (!) 97.4 F (36.3 C)  TempSrc: Oral  SpO2: 97%  Weight: 209 lb (94.8 kg)  Height: 5' 9.5" (1.765 m)    Assessment & Plan:

## 2019-01-19 NOTE — Addendum Note (Signed)
Addended by: Pricilla Holm A on: 01/19/2019 11:25 AM   Modules accepted: Orders

## 2019-01-21 ENCOUNTER — Other Ambulatory Visit: Payer: Self-pay

## 2019-01-22 ENCOUNTER — Other Ambulatory Visit: Payer: Self-pay

## 2019-01-22 ENCOUNTER — Encounter: Payer: Self-pay | Admitting: Endocrinology

## 2019-01-22 ENCOUNTER — Ambulatory Visit (INDEPENDENT_AMBULATORY_CARE_PROVIDER_SITE_OTHER): Payer: Medicare Other | Admitting: Endocrinology

## 2019-01-22 VITALS — BP 122/64 | HR 64 | Temp 97.7°F | Ht 69.5 in | Wt 210.0 lb

## 2019-01-22 DIAGNOSIS — I2510 Atherosclerotic heart disease of native coronary artery without angina pectoris: Secondary | ICD-10-CM

## 2019-01-22 DIAGNOSIS — E1065 Type 1 diabetes mellitus with hyperglycemia: Secondary | ICD-10-CM

## 2019-01-22 DIAGNOSIS — E782 Mixed hyperlipidemia: Secondary | ICD-10-CM

## 2019-01-22 NOTE — Progress Notes (Signed)
Patient ID: [name], female   DOB: Sep 08, 1946, 73 y.o.   MRN: [id number]   Reason for visit: DIABETES followup.  Diagnosis: Type 1 diabetes mellitus, date of diagnosis: 1979.    HISTORY of present illness:   Insulin Pump: CURRENT brand: Medtronic 630  BASAL settings: Midnight = 0.75,  2 AM  0.6, 4 AM = 1.0, 7 AM = 1.95, 11 AM = 0.8; 2 PM = 1.1, 5 PM = 2.1, 7:30 PM = 3.0 and 10 PM = 1.7.   Total daily insulin = up to 60 units   Carbohydrate ratio 1: 12 breakfast, 1: 10 lunch and 1: 5.5 at dinner; sensitivity midnight = 1:30; 1: 25 from 5 AM-7 AM, 7 AM = 40 ; target 120-130  Insulin on board indicator on, duration 4 hrs   DIABETES: She has had long-standing diabetes with A1c levels usually above target. In 10/13 ACTOS was restarted because of her large insulin requirement and diabetic dyslipidemia.  Her blood sugars improved significantly reduced.  Also has been taking metformin as insulin sensitizer  RECENT history:   Her A1c is still relatively high at 8.1, previously 7.9  Current blood sugar patterns from sensor and pump download:   CONTINUOUS GLUCOSE MONITORING RECORD INTERPRETATION    Dates of Recording: 3/7 through 3/20  Sensor description: Dexcom G6  Results statistics:   CGM use % of time  97  Average and SD  179+/-50  Time in range       51 %  % Time Above 180  48  % Time above 250  7  % Time Below target  0.4    Glycemic patterns summary: Her blood sugars are within the target range only transiently in the middle of the night, midday and before dinner but otherwise relatively high with the highest readings around 9-10 PM on an average She does have fluctuation in her blood sugars at all times but has minimal hypoglycemia  Hyperglycemic episodes are occurring overnight, sometimes after lunch and frequently after dinner  Hypoglycemic episodes occurred  Overnight periods: Blood sugars are quite variable but mostly high, have been mildly low  only once possibly after correction the night before  Preprandial periods: Blood sugars are rising before breakfast and averaging nearly 200 at about 7 AM LUNCH blood sugar average is about 150 Blood sugars around dinnertime are averaging 150 with some variability  Postprandial periods: After breakfast: Blood sugars are relatively lower with correction doses within target usually After lunch: Blood sugars may rise at the most up to 200 but not consistently DINNER: Blood sugars are fairly consistently rising after eating about without her taking a bolus and only rarely appear to be not driving excessively, blood sugars are peaking at around 10 PM and averaging about 200 at bedtime   She appears to be missing some boluses at dinnertime  Otherwise mostly getting high readings after supper including last night which she thinks is from eating cornbread  Not clear how much she is benefiting from Actos but this has been helpful in the past  She is trying to exercise and this does not affect her blood sugar readings  She has noticed her blood sugars to be higher before breakfast consistently but has not asked to make any changes and does not usually feel comfortable making basal rate changes on her own  Hypoglycemic awareness: Has symptoms of feeling tired, headache, sweaty.  She thinks he can usually recognize symptoms  Sometimes may not be  aware during the night and will be notified by her sensor that the sugar is low  She has symptoms when blood glucose is less than 60. Uses glucose tablets for treatment even overnight     DIET has been usually fairly controlled with carbohydrate and fat intake, tends to eat out on weekends The mealtimes are variable.    Food preferences: Yogurt and cottage cheese for breakfast usually.   Blood sugars with exercise: Previously after exercise blood sugar will get lower in 1-2 hrs but sometimes it may get lower while exercising and she carries glucose  tablets with her. .  Wt Readings from Last 3 Encounters:  01/22/19 210 lb (95.3 kg)  01/19/19 209 lb (94.8 kg)  11/17/18 206 lb 9.6 oz (93.7 kg)    LABS:  Lab Results  Component Value Date   HGBA1C 8.0 (H) 01/19/2019   HGBA1C 8.1 (H) 10/19/2018   HGBA1C 7.9 (A) 07/28/2018   Lab Results  Component Value Date   MICROALBUR <0.7 01/19/2019   LDLCALC 54 01/19/2019   CREATININE 0.74 01/19/2019    Lab on 01/19/2019  Component Date Value Ref Range Status  . TSH 01/19/2019 1.86  0.35 - 4.50 uIU/mL Final  . Cholesterol 01/19/2019 115  0 - 200 mg/dL Final   ATP III Classification       Desirable:  < 200 mg/dL               Borderline High:  200 - 239 mg/dL          High:  > = 240 mg/dL  . Triglycerides 01/19/2019 72.0  0.0 - 149.0 mg/dL Final   Normal:  <150 mg/dLBorderline High:  150 - 199 mg/dL  . HDL 01/19/2019 46.80  >39.00 mg/dL Final  . VLDL 01/19/2019 14.4  0.0 - 40.0 mg/dL Final  . LDL Cholesterol 01/19/2019 54  0 - 99 mg/dL Final  . Total CHOL/HDL Ratio 01/19/2019 2   Final                  Men          Women1/2 Average Risk     3.4          3.3Average Risk          5.0          4.42X Average Risk          9.6          7.13X Average Risk          15.0          11.0                      . NonHDL 01/19/2019 68.48   Final   NOTE:  Non-HDL goal should be 30 mg/dL higher than patient's LDL goal (i.e. LDL goal of < 70 mg/dL, would have non-HDL goal of < 100 mg/dL)  . Microalb, Ur 01/19/2019 <0.7  0.0 - 1.9 mg/dL Final  . Creatinine,U 01/19/2019 59.0  mg/dL Final  . Microalb Creat Ratio 01/19/2019 1.2  0.0 - 30.0 mg/g Final  . Sodium 01/19/2019 140  135 - 145 mEq/L Final  . Potassium 01/19/2019 4.3  3.5 - 5.1 mEq/L Final  . Chloride 01/19/2019 104  96 - 112 mEq/L Final  . CO2 01/19/2019 31  19 - 32 mEq/L Final  . Glucose, Bld 01/19/2019 133* 70 - 99 mg/dL Final  . BUN 01/19/2019 14  6 -  23 mg/dL Final  . Creatinine, Ser 01/19/2019 0.74  0.40 - 1.20 mg/dL Final  . Total  Bilirubin 01/19/2019 0.4  0.2 - 1.2 mg/dL Final  . Alkaline Phosphatase 01/19/2019 96  39 - 117 U/L Final  . AST 01/19/2019 32  0 - 37 U/L Final  . ALT 01/19/2019 44* 0 - 35 U/L Final  . Total Protein 01/19/2019 6.5  6.0 - 8.3 g/dL Final  . Albumin 01/19/2019 3.7  3.5 - 5.2 g/dL Final  . Calcium 01/19/2019 9.8  8.4 - 10.5 mg/dL Final  . GFR 01/19/2019 77.34  >60.00 mL/min Final  . Hgb A1c MFr Bld 01/19/2019 8.0* 4.6 - 6.5 % Final   Glycemic Control Guidelines for People with Diabetes:Non Diabetic:  <6%Goal of Therapy: <7%Additional Action Suggested:  >8%     Allergies as of 01/22/2019      Reactions   Sulfa Antibiotics Rash      Medication List       Accurate as of January 22, 2019  9:18 AM. Always use your most recent med list.        aspirin 325 MG EC tablet Take 325 mg by mouth daily.   CALCIUM + D PO Take 600 mg by mouth 2 (two) times daily.   cholecalciferol 1000 units tablet Commonly known as:  VITAMIN D Take 1,000 Units by mouth 2 (two) times daily.   clopidogrel 75 MG tablet Commonly known as:  PLAVIX Take 1 tablet (75 mg total) by mouth daily.   diclofenac sodium 1 % Gel Commonly known as:  VOLTAREN Apply 4 g topically 4 (four) times daily.   ezetimibe 10 MG tablet Commonly known as:  ZETIA Take 1 tablet (10 mg total) by mouth daily.   furosemide 20 MG tablet Commonly known as:  LASIX Take 20 mg by mouth. Take 1 tablet by mouth as needed for 5 days.   glucose blood test strip Commonly known as:  Estate manager/land agent Use as instructed to check sugar 6 times daily. Dx Code E10.65   insulin aspart 100 UNIT/ML injection Commonly known as:  NovoLOG INJECT SUBCUTANEOUSLY A    MAXIMUM OF 200 UNITS PER   DAY VIA INSULIN PUMP   insulin pump Soln Inject into the skin. Novolog Vial, max 200 units per day with pump   losartan 25 MG tablet Commonly known as:  COZAAR Take 1 tablet (25 mg total) by mouth daily.   metFORMIN 500 MG 24 hr tablet Commonly known as:   GLUCOPHAGE-XR Take 1 tablet (500 mg total) by mouth as directed. 1 tablet in the AM and 2 tablets in the PM   metoprolol succinate 100 MG 24 hr tablet Commonly known as:  TOPROL-XL TAKE 1 TABLET (100 MG TOTAL) BY MOUTH DAILY.   mirabegron ER 50 MG Tb24 tablet Commonly known as:  Myrbetriq Take 1 tablet (50 mg total) by mouth daily.   niacin 1000 MG CR tablet Commonly known as:  NIASPAN Take 1 tablet (1,000 mg total) by mouth at bedtime.   nitroGLYCERIN 0.4 MG SL tablet Commonly known as:  Nitrostat Place 1 tablet (0.4 mg total) under the tongue every 5 (five) minutes as needed for chest pain.   pioglitazone 15 MG tablet Commonly known as:  ACTOS Take 1 tablet (15 mg total) by mouth daily.   potassium chloride 10 MEQ tablet Commonly known as:  K-DUR Take 1 tablet (10 mEq total) by mouth daily as needed (take with Lasix).   rosuvastatin 40 MG tablet  Commonly known as:  CRESTOR Take 1 tablet (40 mg total) by mouth daily.       Allergies:  Allergies  Allergen Reactions  . Sulfa Antibiotics Rash    Past Medical History:  Diagnosis Date  . Arthritis    OA  . Atrial flutter ([place])    a. s/p TEE/DCCV 08/16/13 (normal LV function, no LAA thrombus).b. s/p ablation by Dr Lovena Le 09-23-2013  . Coronary artery disease    2009 LAD Promus stent  . Dysrhythmia    Aflutter, no issues after ablation 2014  . Fibroid   . Hyperlipidemia   . Insulin pump in place   . Osteopenia   . Type 1 diabetes mellitus on insulin therapy ([place])   . Valvular heart disease    a. Mild MR/TR by TEE 08/2013.    Past Surgical History:  Procedure Laterality Date  . ABLATION  09-23-2013   CTI by Dr Lovena Le  . APPENDECTOMY    . APPLICATION OF WOUND VAC Left 04/11/2017   Procedure: APPLICATION OF WOUND VAC LEFT KNEE;  Surgeon: Rod Can, MD;  Location: [place];  Service: Orthopedics;  Laterality: Left;  . ATRIAL FLUTTER ABLATION N/A 09/23/2013   Procedure: ATRIAL FLUTTER ABLATION;  Surgeon: Evans Lance, MD;  Location: [facility] CATH LAB;  Service: Cardiovascular;  Laterality: N/A;  . CARDIOVERSION N/A 08/16/2013   Procedure: CARDIOVERSION;  Surgeon: Thayer Headings, MD;  Location: [place];  Service: Cardiovascular;  Laterality: N/A;  spoke with [name]  . CATARACT EXTRACTION Bilateral   . COLONOSCOPY    . CORONARY ANGIOPLASTY WITH STENT PLACEMENT    . MYOMECTOMY  1977  . ORIF PATELLA Left 04/11/2017  . ORIF PATELLA Left 04/11/2017   Procedure: OPEN REDUCTION INTERNAL (ORIF) FIXATION PATELLA LEFT KNEE;  Surgeon: Rod Can, MD;  Location: [place];  Service: Orthopedics;  Laterality: Left;  . PELVIC LAPAROSCOPY    . TEE WITHOUT CARDIOVERSION N/A 08/16/2013   Procedure: TRANSESOPHAGEAL ECHOCARDIOGRAM (TEE);  Surgeon: Thayer Headings, MD;  Location: [facility] ENDOSCOPY;  Service: Cardiovascular;  Laterality: N/A;    Family History  Problem Relation Age of Onset  . CAD Father 71       died age 62  . CAD Brother 66       small vessel disease  . Atrial fibrillation Mother   . Diabetes Mother   . Hypertension Mother   . CAD Cousin        paternal  . CAD Paternal Grandfather        early onset  . Colon cancer Neg Hx     Social History:  reports that she has never smoked. She has never used smokeless tobacco. She reports current alcohol use of about 5.0 standard drinks of alcohol per week. She reports that she does not use drugs.  REVIEW of systems:     Cardiac function: Her echo shows only grade 1 diastolic dysfunction and normal systolic function  She has had atrial arrhythmias treated with metoprolol  Blood pressure usually normal, only on 25 mg losartan for preventive reasons  BP Readings from Last 3 Encounters:  01/22/19 122/64  01/19/19 130/70  11/17/18 134/70     She has had diabetic dyslipidemia with significantly high LDL particle number and this has been managed with Crestor 40 mg and Niaspan 1000 mg Her last LDL particle number was down to below 900  She is  taking her niacin regularly with her Crestor Also with adding Zetia in 3/19 her LDL  is below 66   Lab Results  Component Value Date   CHOL 115 01/19/2019   HDL 46.80 01/19/2019   LDLCALC 54 01/19/2019   LDLDIRECT 79.0 01/11/2015   TRIG 72.0 01/19/2019   CHOLHDL 2 01/19/2019     No history of thyroid disease, recent TSH normal   BP 122/64 (BP Location: Left Arm, Patient Position: Sitting, Cuff Size: Normal)   Pulse 64   Temp 97.7 F (36.5 C) (Oral)   Ht 5' 9.5" (1.765 m)   Wt 210 lb (95.3 kg)   SpO2 98%   BMI 30.57 kg/m    ASSESSMENT/PLAN:  DIABETES type 1 with inadequate control on Medtronic insulin pump  Her A1c continues to be above 7% and now 8% about the same  See history of present illness for detailed discussion of her current blood sugar patterns and management with her pump  Using her Dexcom recently able to get a better idea of her blood sugar patterns as seen in the interpretation discussed above She appears to be needing a higher basal rate early morning and at least on some nights during the night also Appears to be quite insulin resistant in the evenings and not clear why Blood sugars are the highest around 10 PM and this may be partly related to sometimes forgetting her bolus before eating Her blood sugars may be still going up excessively after certain meals in the evening even with her carbohydrate coverage of 5.5 and a basal rate of 3.0 in the evening  No significant hypoglycemia documented recently He has exercise lately but not clear if it is helping her diabetes control   Recommendations: Consistent bolusing with every meal and high sugar She will try to bolus ahead of time 5-10 minutes if able to Carbohydrate coverage 4.5 at dinnertime She can still continue to check blood sugars with fingerstick periodically even though she has the G6 sensor now, would prefer she do this when she is bolusing or if she has an unusually high or low reading Basal  rate changes to be done as follows Midnight = 0.8, 6 AM-11 AM = 2.15 8 PM-3.2 and 10 PM = 1.8 To call if she is consistently having high readings again or if she starts getting unusual low sugars If she is able to switch to the T-Slim pump she can do so Continue Actos  LIPIDS: Stay on current regimen as lipids are well controlled including niacin   Counseling time on subjects discussed in assessment and plan sections is over 50% of today's 25 minute visit  There are no Patient Instructions on file for this visit.   Elayne Snare 01/22/19   Note: This office note was prepared with Dragon voice recognition system technology. Any transcriptional errors that result from this process are unintentional.

## 2019-02-03 ENCOUNTER — Telehealth: Payer: Self-pay | Admitting: Endocrinology

## 2019-02-03 ENCOUNTER — Other Ambulatory Visit: Payer: Self-pay

## 2019-02-03 MED ORDER — INSULIN ASPART 100 UNIT/ML ~~LOC~~ SOLN: SUBCUTANEOUS | 1 refills | Status: DC

## 2019-02-03 NOTE — Telephone Encounter (Signed)
Rx sent 

## 2019-02-03 NOTE — Telephone Encounter (Signed)
MEDICATION: Novolog  PHARMACY:  [facility]  IS THIS A 90 DAY SUPPLY :   IS PATIENT OUT OF MEDICATION:   IF NOT; HOW MUCH IS LEFT: 2 vials  LAST APPOINTMENT DATE: @3 /20/2020  NEXT APPOINTMENT DATE:@6 /15/2020  DO WE HAVE YOUR PERMISSION TO LEAVE A DETAILED MESSAGE:  OTHER COMMENTS:    **Let patient know to contact pharmacy at the end of the day to make sure medication is ready. **  ** Please notify patient to allow 48-72 hours to process**  **Encourage patient to contact the pharmacy for refills or they can request refills through [facility]**

## 2019-02-05 ENCOUNTER — Other Ambulatory Visit: Payer: Self-pay

## 2019-02-05 ENCOUNTER — Telehealth: Payer: Self-pay | Admitting: Endocrinology

## 2019-02-05 MED ORDER — INSULIN LISPRO 100 UNIT/ML ~~LOC~~ SOLN: SUBCUTANEOUS | 1 refills | Status: DC

## 2019-02-05 NOTE — Telephone Encounter (Signed)
Patient stated that insurance will not cover the Novolog but will cover the Humalog. She is needing a new prescription sent to the [facility] for the Humalog.

## 2019-02-05 NOTE — Telephone Encounter (Signed)
Rx sent 

## 2019-04-15 DIAGNOSIS — H02413 Mechanical ptosis of bilateral eyelids: Secondary | ICD-10-CM | POA: Diagnosis not present

## 2019-04-15 DIAGNOSIS — H0279 Other degenerative disorders of eyelid and periocular area: Secondary | ICD-10-CM | POA: Diagnosis not present

## 2019-04-15 DIAGNOSIS — H57813 Brow ptosis, bilateral: Secondary | ICD-10-CM | POA: Diagnosis not present

## 2019-04-15 DIAGNOSIS — H02834 Dermatochalasis of left upper eyelid: Secondary | ICD-10-CM | POA: Diagnosis not present

## 2019-04-15 DIAGNOSIS — H02831 Dermatochalasis of right upper eyelid: Secondary | ICD-10-CM | POA: Diagnosis not present

## 2019-04-15 DIAGNOSIS — H53483 Generalized contraction of visual field, bilateral: Secondary | ICD-10-CM | POA: Diagnosis not present

## 2019-04-19 ENCOUNTER — Other Ambulatory Visit: Payer: Self-pay

## 2019-04-19 ENCOUNTER — Other Ambulatory Visit (INDEPENDENT_AMBULATORY_CARE_PROVIDER_SITE_OTHER): Payer: Medicare Other

## 2019-04-19 DIAGNOSIS — E1065 Type 1 diabetes mellitus with hyperglycemia: Secondary | ICD-10-CM

## 2019-04-19 LAB — HEMOGLOBIN A1C: Hgb A1c MFr Bld: 7.7 % — ABNORMAL HIGH (ref 4.6–6.5)

## 2019-04-19 LAB — GLUCOSE, RANDOM: Glucose, Bld: 226 mg/dL — ABNORMAL HIGH (ref 70–99)

## 2019-04-26 ENCOUNTER — Encounter: Payer: Self-pay | Admitting: Endocrinology

## 2019-04-26 NOTE — Progress Notes (Signed)
Patient ID: [name], female   DOB: 11-Feb-1946, 73 y.o.   MRN: [id number]   Reason for visit: DIABETES followup.  Diagnosis: Type 1 diabetes mellitus, date of diagnosis: 1979.    HISTORY of present illness:   Insulin Pump: CURRENT brand: Medtronic 630  BASAL settings: Midnight = 0.75,  2 AM  0.6, 4 AM = 1.0, 7 AM = 1.95, 11 AM = 0.8; 2 PM = 1.1, 5 PM = 2.1, 7:30 PM = 3.0 and 10 PM = 1.7.   Total daily insulin = up to 60 units   Carbohydrate ratio 1: 12 breakfast, 1: 10 lunch and 1: 4.5 at dinner; sensitivity midnight = 1:30; 1: 25 from 5 AM-7 AM, 7 AM = 40 ; target 120-130  Insulin on board indicator on, duration 4 hrs   DIABETES: She has had long-standing diabetes with A1c levels usually above target. In 10/13 pioglitazone was restarted because of her large insulin requirement and diabetic dyslipidemia.  Her blood sugars improved significantly with this.  Also has been taking metformin as insulin sensitizer  RECENT history:   Her A1c is slightly better at 7.7 compared to 8.0  Current blood sugar patterns from sensor and pump download:   CONTINUOUS GLUCOSE MONITORING RECORD INTERPRETATION    Dates of Recording: 6/10 to 04/27/2019  Sensor description: Dexcom G6  Results statistics:   CGM use % of time  95  Average and SD  152+/-61  Time in range       67 % was 51  % Time Above 180  28  % Time above 250  7.4  % Time Below target  6%    Glycemic patterns summary: Blood sugars are showing significant variability overnight and late afternoons and evenings However they are within the range most of the time except trending higher after 4 PM and around 6 AM  Hyperglycemic episodes are occurring variably overnight, early morning until 6 AM and also periodically between 6 PM and midnight  Hypoglycemic episodes occurred 3 times overnight mostly between 1-3 AM and no significant hypoglycemia during the day  Overnight periods: Blood sugars are quite variable but  generally within the target range and showing decline between about 11 PM until 2 AM and then generally rising until about 7 AM Hypoglycemia occurred on 3 occasions  Preprandial periods: Blood sugars are modestly high at breakfast time with some variability At lunchtime blood sugars are mostly averaging about 130 Blood sugars at dinnertime are quite variable and may be low normal or occasionally going up to over 200  Postprandial periods:   After breakfast: Blood sugars are usually well controlled with her being able to control higher preprandial readings and otherwise keep the blood sugars within target range  After lunch:   Blood sugars are on an average very flat  After dinner: She has significant variability with a couple of significantly high readings but otherwise within range On Sunday night after her bolus of blood sugar progressively went down low and she got hypoglycemic by about 10 PM, this is not typical  Problems with management:  She has not apparently missed her boluses as much as before  She is having unexpected low sugars overnight as discussed above in the early part of the night which is not consistent; one episode was related to her overestimating her carbohydrate at dinnertime  She is having some tendency to gaining weight likely from increased carbohydrate intake since this is despite her exercising  She does  have quite a bit of variability in her blood sugars overnight in the evenings and some of the hyperglycemic episodes are not explained  Although she treats her hypoglycemia appropriately she tends to have occasional prolonged low sugars overnight but she has not been suspending her pump as directed to allow blood sugars to rise.  Still appears to be requiring large amounts of boluses in the evening with carbohydrate coverage 1: 4.5 at dinnertime  Hypoglycemic awareness: Has symptoms of feeling tired, headache, sweaty.  She thinks he can usually recognize  symptoms  Sometimes may not be aware during the night and will be notified by her sensor that the sugar is low  She has symptoms when blood glucose is less than 60. Uses glucose tablets for treatment even overnight     DIET has been usually fairly controlled with carbohydrate and fat intake, tends to eat out on weekends The mealtimes are variable.    Food preferences: Yogurt and cottage cheese for breakfast usually.   Blood sugars with exercise: No significant problems recently with adequate control and no hypoglycemia  .  Wt Readings from Last 3 Encounters:  04/27/19 214 lb (97.1 kg)  01/22/19 210 lb (95.3 kg)  01/19/19 209 lb (94.8 kg)    LABS:  Lab Results  Component Value Date   HGBA1C 7.7 (H) 04/19/2019   HGBA1C 8.0 (H) 01/19/2019   HGBA1C 8.1 (H) 10/19/2018   Lab Results  Component Value Date   MICROALBUR <0.7 01/19/2019   LDLCALC 54 01/19/2019   CREATININE 0.74 01/19/2019    No visits with results within 1 Week(s) from this visit.  Latest known visit with results is:  Lab on 04/19/2019  Component Date Value Ref Range Status  . Glucose, Bld 04/19/2019 226* 70 - 99 mg/dL Final  . Hgb A1c MFr Bld 04/19/2019 7.7* 4.6 - 6.5 % Final   Glycemic Control Guidelines for People with Diabetes:Non Diabetic:  <6%Goal of Therapy: <7%Additional Action Suggested:  >8%     Allergies as of 04/27/2019      Reactions   Sulfa Antibiotics Rash      Medication List       Accurate as of April 27, 2019  9:17 AM. If you have any questions, ask your nurse or doctor.        aspirin 325 MG EC tablet Take 325 mg by mouth daily.   CALCIUM + D PO Take 600 mg by mouth 2 (two) times daily.   cholecalciferol 1000 units tablet Commonly known as: VITAMIN D Take 1,000 Units by mouth 2 (two) times daily.   clopidogrel 75 MG tablet Commonly known as: PLAVIX Take 1 tablet (75 mg total) by mouth daily.   diclofenac sodium 1 % Gel Commonly known as: VOLTAREN Apply 4 g topically 4  (four) times daily.   ezetimibe 10 MG tablet Commonly known as: ZETIA Take 1 tablet (10 mg total) by mouth daily.   furosemide 20 MG tablet Commonly known as: LASIX Take 20 mg by mouth. Take 1 tablet by mouth as needed for 5 days.   glucose blood test strip Commonly known as: Estate manager/land agent Use as instructed to check sugar 6 times daily. Dx Code E10.65   insulin lispro 100 UNIT/ML injection Commonly known as: HUMALOG Use max of 200 units daily via insulin pump.   insulin pump Soln Inject into the skin. Novolog Vial, max 200 units per day with pump   losartan 25 MG tablet Commonly known as: COZAAR Take 1  tablet (25 mg total) by mouth daily.   metFORMIN 500 MG 24 hr tablet Commonly known as: GLUCOPHAGE-XR Take 1 tablet (500 mg total) by mouth as directed. 1 tablet in the AM and 2 tablets in the PM   metoprolol succinate 100 MG 24 hr tablet Commonly known as: TOPROL-XL TAKE 1 TABLET (100 MG TOTAL) BY MOUTH DAILY.   mirabegron ER 50 MG Tb24 tablet Commonly known as: Myrbetriq Take 1 tablet (50 mg total) by mouth daily.   niacin 1000 MG CR tablet Commonly known as: NIASPAN Take 1 tablet (1,000 mg total) by mouth at bedtime.   nitroGLYCERIN 0.4 MG SL tablet Commonly known as: Nitrostat Place 1 tablet (0.4 mg total) under the tongue every 5 (five) minutes as needed for chest pain.   pioglitazone 15 MG tablet Commonly known as: ACTOS Take 1 tablet (15 mg total) by mouth daily.   potassium chloride 10 MEQ tablet Commonly known as: K-DUR Take 1 tablet (10 mEq total) by mouth daily as needed (take with Lasix).   rosuvastatin 40 MG tablet Commonly known as: CRESTOR Take 1 tablet (40 mg total) by mouth daily.       Allergies:  Allergies  Allergen Reactions  . Sulfa Antibiotics Rash    Past Medical History:  Diagnosis Date  . Arthritis    OA  . Atrial flutter ([place])    a. s/p TEE/DCCV 08/16/13 (normal LV function, no LAA thrombus).b. s/p ablation by Dr  Lovena Le 09-23-2013  . Coronary artery disease    2009 LAD Promus stent  . Dysrhythmia    Aflutter, no issues after ablation 2014  . Fibroid   . Hyperlipidemia   . Insulin pump in place   . Osteopenia   . Type 1 diabetes mellitus on insulin therapy ([place])   . Valvular heart disease    a. Mild MR/TR by TEE 08/2013.    Past Surgical History:  Procedure Laterality Date  . ABLATION  09-23-2013   CTI by Dr Lovena Le  . APPENDECTOMY    . APPLICATION OF WOUND VAC Left 04/11/2017   Procedure: APPLICATION OF WOUND VAC LEFT KNEE;  Surgeon: Rod Can, MD;  Location: [place];  Service: Orthopedics;  Laterality: Left;  . ATRIAL FLUTTER ABLATION N/A 09/23/2013   Procedure: ATRIAL FLUTTER ABLATION;  Surgeon: Evans Lance, MD;  Location: [facility] CATH LAB;  Service: Cardiovascular;  Laterality: N/A;  . CARDIOVERSION N/A 08/16/2013   Procedure: CARDIOVERSION;  Surgeon: Thayer Headings, MD;  Location: [place];  Service: Cardiovascular;  Laterality: N/A;  spoke with [name]  . CATARACT EXTRACTION Bilateral   . COLONOSCOPY    . CORONARY ANGIOPLASTY WITH STENT PLACEMENT    . MYOMECTOMY  1977  . ORIF PATELLA Left 04/11/2017  . ORIF PATELLA Left 04/11/2017   Procedure: OPEN REDUCTION INTERNAL (ORIF) FIXATION PATELLA LEFT KNEE;  Surgeon: Rod Can, MD;  Location: [place];  Service: Orthopedics;  Laterality: Left;  . PELVIC LAPAROSCOPY    . TEE WITHOUT CARDIOVERSION N/A 08/16/2013   Procedure: TRANSESOPHAGEAL ECHOCARDIOGRAM (TEE);  Surgeon: Thayer Headings, MD;  Location[facility] ENDOSCOPY;  Service: Cardiovascular;  Laterality: N/A;    Family History  Problem Relation Age of Onset  . CAD Father 78       died age 78  . CAD Brother 74       small vessel disease  . Atrial fibrillation Mother   . Diabetes Mother   . Hypertension Mother   . CAD Cousin  paternal  . CAD Paternal Grandfather        early onset  . Colon cancer Neg Hx     Social History:  reports that she has never smoked. She has  never used smokeless tobacco. She reports current alcohol use of about 5.0 standard drinks of alcohol per week. She reports that she does not use drugs.  REVIEW of systems:     Cardiac function: Her echo shows only grade 1 diastolic dysfunction and normal systolic function  She has had atrial arrhythmias treated with metoprolol  Blood pressure usually normal, only on 25 mg losartan from cardiologist for preventive reasons  BP Readings from Last 3 Encounters:  04/27/19 116/60  01/22/19 122/64  01/19/19 130/70     She has had diabetic dyslipidemia with significantly high LDL particle number and this has been managed with Crestor 40 mg and Niaspan 1000 mg Her last LDL particle number was down to below 900  She is taking her niacin regularly with her Crestor  Zetia added in 3/19 More recently her LDL is below 70   Lab Results  Component Value Date   CHOL 115 01/19/2019   HDL 46.80 01/19/2019   LDLCALC 54 01/19/2019   LDLDIRECT 79.0 01/11/2015   TRIG 72.0 01/19/2019   CHOLHDL 2 01/19/2019     No history of thyroid disease, TSH normal   BP 116/60 (BP Location: Left Arm, Patient Position: Sitting, Cuff Size: Normal)   Pulse 72   Ht 5' 9.5" (1.765 m)   Wt 214 lb (97.1 kg)   SpO2 97%   BMI 31.15 kg/m    ASSESSMENT/PLAN:  DIABETES type 1 with inadequate control on Medtronic insulin pump  Her A1c is 7.7  See history of present illness for detailed discussion of her current blood sugar patterns and management with her pump  She has inconsistent level of control with significant variability especially overnight Some of her low sugars have been related to excessive bolusing and not estimating carbohydrates appropriately However she still needs to continue improving her estimation of mealtime insulin  No significant hypoglycemia documented recently He has exercise lately but not clear if it is helping her diabetes control   Recommendations: Continue to improve  compliance with bolusing before the meal Make an accurate estimate of carbohydrates before bolusing Add only appropriate amounts of extra insulin for higher fat meals and extend the bolus Pump settings will be adjusted MN: 0.7, 2AM:0.55, 3:30AM: 1.1, 6AM:2.25,11AM: 0.80, 2PM: 1.1, 3:30PM: 2.1, 8PM: 3.0,10PM: 1.8 Continue exercise  LIPIDS: Stay on current regimen as lipids are well controlled including niacin   Counseling time on subjects discussed in assessment and plan sections is over 50% of today's 25 minute visit  There are no Patient Instructions on file for this visit.   Elayne Snare 04/27/19   Note: This office note was prepared with Dragon voice recognition system technology. Any transcriptional errors that result from this process are unintentional.

## 2019-04-27 ENCOUNTER — Ambulatory Visit (INDEPENDENT_AMBULATORY_CARE_PROVIDER_SITE_OTHER): Payer: Medicare Other | Admitting: Endocrinology

## 2019-04-27 ENCOUNTER — Telehealth: Payer: Self-pay | Admitting: Nutrition

## 2019-04-27 ENCOUNTER — Other Ambulatory Visit: Payer: Self-pay

## 2019-04-27 ENCOUNTER — Encounter: Payer: Self-pay | Admitting: Endocrinology

## 2019-04-27 VITALS — BP 116/60 | HR 72 | Ht 69.5 in | Wt 214.0 lb

## 2019-04-27 DIAGNOSIS — E782 Mixed hyperlipidemia: Secondary | ICD-10-CM | POA: Diagnosis not present

## 2019-04-27 DIAGNOSIS — I2510 Atherosclerotic heart disease of native coronary artery without angina pectoris: Secondary | ICD-10-CM | POA: Diagnosis not present

## 2019-04-27 DIAGNOSIS — E1065 Type 1 diabetes mellitus with hyperglycemia: Secondary | ICD-10-CM

## 2019-04-27 NOTE — Patient Instructions (Signed)
Suspend for lows if needed  Estimate carbs accurately

## 2019-04-27 NOTE — Telephone Encounter (Signed)
Patient had already made the changes needed to her basal rate.  Per her review, settings were made appropriate0y  :MN: 0.7, 2AM:0.55, 3:30AM: 1.1, 6AM:2.25,11AM: 0.80, 2PM: 1.1, 3:30PM: 2.1, 8PM: 3.0,10PM: 1.810PM.She did need however, help with changing from a 24 hour clock, to a 12 hour clock.  She did this with help from me.  She had no final questions.

## 2019-04-28 DIAGNOSIS — Z1231 Encounter for screening mammogram for malignant neoplasm of breast: Secondary | ICD-10-CM | POA: Diagnosis not present

## 2019-04-28 LAB — HM MAMMOGRAPHY

## 2019-04-29 ENCOUNTER — Encounter: Payer: Self-pay | Admitting: Internal Medicine

## 2019-04-29 ENCOUNTER — Other Ambulatory Visit: Payer: Self-pay | Admitting: Endocrinology

## 2019-05-11 ENCOUNTER — Telehealth: Payer: Self-pay | Admitting: Cardiovascular Disease

## 2019-05-11 NOTE — Telephone Encounter (Signed)
New message    Pt scheduled for virtual visit with Dr. Acie Fredrickson. Pt has a smartphone. Phone number is listed in appt notes.       Virtual Visit Pre-Appointment Phone Call  "(Name), I am calling you today to discuss your upcoming appointment. We are currently trying to limit exposure to the virus that causes COVID-19 by seeing patients at home rather than in the office."  1. "What is the BEST phone number to call the day of the visit?" - include this in appointment notes  2. Do you have or have access to (through a family member/friend) a smartphone with video capability that we can use for your visit?" a. If yes - list this number in appt notes as cell (if different from BEST phone #) and list the appointment type as a VIDEO visit in appointment notes b. If no - list the appointment type as a PHONE visit in appointment notes  3. Confirm consent - "In the setting of the current Covid19 crisis, you are scheduled for a (phone or video) visit with your provider on (date) at (time).  Just as we do with many in-office visits, in order for you to participate in this visit, we must obtain consent.  If you'd like, I can send this to your mychart (if signed up) or email for you to review.  Otherwise, I can obtain your verbal consent now.  All virtual visits are billed to your insurance company just like a normal visit would be.  By agreeing to a virtual visit, we'd like you to understand that the technology does not allow for your provider to perform an examination, and thus may limit your provider's ability to fully assess your condition. If your provider identifies any concerns that need to be evaluated in person, we will make arrangements to do so.  Finally, though the technology is pretty good, we cannot assure that it will always work on either your or our end, and in the setting of a video visit, we may have to convert it to a phone-only visit.  In either situation, we cannot ensure that we have a  secure connection.  Are you willing to proceed?" STAFF: Did the patient verbally acknowledge consent to telehealth visit? Document YES/NO here: YES  4. Advise patient to be prepared - "Two hours prior to your appointment, go ahead and check your blood pressure, pulse, oxygen saturation, and your weight (if you have the equipment to check those) and write them all down. When your visit starts, your provider will ask you for this information. If you have an Apple Watch or Kardia device, please plan to have heart rate information ready on the day of your appointment. Please have a pen and paper handy nearby the day of the visit as well."  5. Give patient instructions for MyChart download to smartphone OR Doximity/Doxy.me as below if video visit (depending on what platform provider is using)  6. Inform patient they will receive a phone call 15 minutes prior to their appointment time (may be from unknown caller ID) so they should be prepared to answer    TELEPHONE CALL NOTE  [name] has been deemed a candidate for a follow-up tele-health visit to limit community exposure during the Covid-19 pandemic. I spoke with the patient via phone to ensure availability of phone/video source, confirm preferred email & phone number, and discuss instructions and expectations.  I reminded [name] to be prepared with any vital sign and/or heart  rhythm information that could potentially be obtained via home monitoring, at the time of her visit. I reminded [name] to expect a phone call prior to her visit.  [place] Harriette Ohara 05/11/2019 2:05 PM   INSTRUCTIONS FOR DOWNLOADING THE MYCHART APP TO SMARTPHONE  - The patient must first make sure to have activated MyChart and know their login information - If Apple, go to [employer] and type in MyChart in the search bar and download the app. If Android, ask patient to go to [employer] and type in [place] in the search bar and download the app. The app  is free but as with any other app downloads, their phone may require them to verify saved payment information or Apple/Android password.  - The patient will need to then log into the app with their MyChart username and password, and select [facility] as their healthcare provider to link the account. When it is time for your visit, go to the MyChart app, find appointments, and click Begin Video Visit. Be sure to Select Allow for your device to access the Microphone and Camera for your visit. You will then be connected, and your provider will be with you shortly.  **If they have any issues connecting, or need assistance please contact MyChart service desk [phone])**  **If using a computer, in order to ensure the best quality for their visit they will need to use either of the following Internet Browsers: [employer], or Google Chrome**  IF USING DOXIMITY or DOXY.ME - The patient will receive a link just prior to their visit by text.     FULL LENGTH CONSENT FOR TELE-HEALTH VISIT   I hereby voluntarily request, consent and authorize [facility] and its employed or contracted physicians, physician assistants, nurse practitioners or other licensed health care professionals (the Practitioner), to provide me with telemedicine health care services (the Services") as deemed necessary by the treating Practitioner. I acknowledge and consent to receive the Services by the Practitioner via telemedicine. I understand that the telemedicine visit will involve communicating with the Practitioner through live audiovisual communication technology and the disclosure of certain medical information by electronic transmission. I acknowledge that I have been given the opportunity to request an in-person assessment or other available alternative prior to the telemedicine visit and am voluntarily participating in the telemedicine visit.  I understand that I have the right to withhold or withdraw my  consent to the use of telemedicine in the course of my care at any time, without affecting my right to future care or treatment, and that the Practitioner or I may terminate the telemedicine visit at any time. I understand that I have the right to inspect all information obtained and/or recorded in the course of the telemedicine visit and may receive copies of available information for a reasonable fee.  I understand that some of the potential risks of receiving the Services via telemedicine include:   Delay or interruption in medical evaluation due to technological equipment failure or disruption;  Information transmitted may not be sufficient (e.g. poor resolution of images) to allow for appropriate medical decision making by the Practitioner; and/or   In rare instances, security protocols could fail, causing a breach of personal health information.  Furthermore, I acknowledge that it is my responsibility to provide information about my medical history, conditions and care that is complete and accurate to the best of my ability. I acknowledge that Practitioner's advice, recommendations, and/or decision may be based on  factors not within their control, such as incomplete or inaccurate data provided by me or distortions of diagnostic images or specimens that may result from electronic transmissions. I understand that the practice of medicine is not an exact science and that Practitioner makes no warranties or guarantees regarding treatment outcomes. I acknowledge that I will receive a copy of this consent concurrently upon execution via email to the email address I last provided but may also request a printed copy by calling the office of [facility].    I understand that my insurance will be billed for this visit.   I have read or had this consent read to me.  I understand the contents of this consent, which adequately explains the benefits and risks of the Services being provided via telemedicine.    I have been provided ample opportunity to ask questions regarding this consent and the Services and have had my questions answered to my satisfaction.  I give my informed consent for the services to be provided through the use of telemedicine in my medical care  By participating in this telemedicine visit I agree to the above.

## 2019-05-12 ENCOUNTER — Encounter: Payer: Self-pay | Admitting: Cardiovascular Disease

## 2019-05-13 ENCOUNTER — Telehealth (INDEPENDENT_AMBULATORY_CARE_PROVIDER_SITE_OTHER): Payer: Medicare Other | Admitting: Cardiovascular Disease

## 2019-05-13 ENCOUNTER — Encounter: Payer: Self-pay | Admitting: Cardiovascular Disease

## 2019-05-13 ENCOUNTER — Other Ambulatory Visit: Payer: Self-pay

## 2019-05-13 VITALS — BP 148/76 | HR 78 | Ht 69.5 in | Wt 210.0 lb

## 2019-05-13 DIAGNOSIS — E785 Hyperlipidemia, unspecified: Secondary | ICD-10-CM

## 2019-05-13 DIAGNOSIS — Z7189 Other specified counseling: Secondary | ICD-10-CM

## 2019-05-13 DIAGNOSIS — I483 Typical atrial flutter: Secondary | ICD-10-CM

## 2019-05-13 DIAGNOSIS — I2510 Atherosclerotic heart disease of native coronary artery without angina pectoris: Secondary | ICD-10-CM | POA: Diagnosis not present

## 2019-05-13 DIAGNOSIS — E1169 Type 2 diabetes mellitus with other specified complication: Secondary | ICD-10-CM | POA: Diagnosis not present

## 2019-05-13 DIAGNOSIS — I10 Essential (primary) hypertension: Secondary | ICD-10-CM | POA: Insufficient documentation

## 2019-05-13 NOTE — Progress Notes (Addendum)
Virtual Visit via Video Note   This visit type was conducted due to national recommendations for restrictions regarding the COVID-19 Pandemic (e.g. social distancing) in an effort to limit this patient's exposure and mitigate transmission in our community.  Due to her co-morbid illnesses, this patient is at least at moderate risk for complications without adequate follow up.  This format is felt to be most appropriate for this patient at this time.  All issues noted in this document were discussed and addressed.  A limited physical exam was performed with this format.  Please refer to the patient's chart for her consent to telehealth for [facility]   Date:  05/13/2019   ID:  [name], [name], MRN [id number]  Patient Location: Home Provider Location: Home  PCP:  Hoyt Koch, MD  Cardiologist:  Mertie Moores, MD  Electrophysiologist:  None   Problems. 1. CAD 2. Atrial Flutter - s/p Aflutter ablation Nov. 2014.  3. Diabetes Mellitus.      [name] is doing very well. She's not having episodes of chest pain or shortness of breath. She's been able to below of her normal activities without any significant problems. She has not had chest pains .  Her insurance will run out the month before she goes on Medicare.   She has not had any CP and no palpitations.    April 08, 2013:   [name] is doing well. No CP.  Rhythm is stable.     Mar 11, 2014:  [name] is doing well.  She had a atrial flutter ablation several months ago.  She has not been taking her metoprolol..  Nov. 9, 2015:  [name] is doing well. No Cp , no dyspnea.  Active, not exerciseing as much as she would like.  Mar 16, 2015:   [name] is doing well.   Doing well from a cardiac standpoint.   Nov. 10, 2016  Lots of stress - her 53 yo mom is living with her.  Will not let anyone else do anything Lots of stress No CP , no arrhythmias No time to exercise   Mar 20, 2016:  Doing well  Her mother is living back in [place].   Stress is a bit less.  Exercising some .   Nov. 22, 2017.  Doing well . Had afib ablation several years ago and feels great.  DM is well controlled.  Has UTIs frequently .   November 17, 2016: Seen back today for follow-up of her coronary artery disease. Doing well Falls occasionally ,  Broke her left kneecap and left ankle this past year.  Takes the niacin at night and the ASA 81 mg does not conrol the niacin flushng like the 325 mg niacin does.   Jan. 14, 2020  [name] is seen today . No CP or dyspnea  Exercising some.   Diabetes is well controlled.    Evaluation Performed:  Follow-Up Visit  Chief Complaint:  CAD, atrial flutter , DM   May 13, 2019    [name] is a 73 y.o. female with  CAD, hyperlipidemia, atrial flutter.   Remote visit - she is in [place].   Still working on DM  Still takes ASA 325 at night so that she can take Niacin. Tried 81 mg but it was not enough. Takes zetia for lipids  The patient does not have symptoms concerning for COVID-19 infection (fever, chills, cough, or new shortness of breath).  Past Medical History:  Diagnosis Date  . Arthritis    OA  . Atrial flutter ([place])    a. s/p TEE/DCCV 08/16/13 (normal LV function, no LAA thrombus).b. s/p ablation by Dr Lovena Le 09-23-2013  . Coronary artery disease    2009 LAD Promus stent  . Dysrhythmia    Aflutter, no issues after ablation 2014  . Fibroid   . Hyperlipidemia   . Insulin pump in place   . Osteopenia   . Type 1 diabetes mellitus on insulin therapy ([place])   . Valvular heart disease    a. Mild MR/TR by TEE 08/2013.   Past Surgical History:  Procedure Laterality Date  . ABLATION  09-23-2013   CTI by Dr Lovena Le  . APPENDECTOMY    . APPLICATION OF WOUND VAC Left 04/11/2017   Procedure: APPLICATION OF WOUND VAC LEFT KNEE;  Surgeon: Rod Can, MD;  Location: [facility];  Service: Orthopedics;  Laterality:  Left;  . ATRIAL FLUTTER ABLATION N/A 09/23/2013   Procedure: ATRIAL FLUTTER ABLATION;  Surgeon: Evans Lance, MD;  Location: [facility] CATH LAB;  Service: Cardiovascular;  Laterality: N/A;  . CARDIOVERSION N/A 08/16/2013   Procedure: CARDIOVERSION;  Surgeon: Thayer Headings, MD;  Location: [facility];  Service: Cardiovascular;  Laterality: N/A;  spoke with [name]  . CATARACT EXTRACTION Bilateral   . COLONOSCOPY    . CORONARY ANGIOPLASTY WITH STENT PLACEMENT    . MYOMECTOMY  1977  . ORIF PATELLA Left 04/11/2017  . ORIF PATELLA Left 04/11/2017   Procedure: OPEN REDUCTION INTERNAL (ORIF) FIXATION PATELLA LEFT KNEE;  Surgeon: Rod Can, MD;  Location: [facility];  Service: Orthopedics;  Laterality: Left;  . PELVIC LAPAROSCOPY    . TEE WITHOUT CARDIOVERSION N/A 08/16/2013   Procedure: TRANSESOPHAGEAL ECHOCARDIOGRAM (TEE);  Surgeon: Thayer Headings, MD;  Location: [facility] ENDOSCOPY;  Service: Cardiovascular;  Laterality: N/A;     Current Meds  Medication Sig  . aspirin 325 MG EC tablet Take 325 mg by mouth daily.  . Calcium Carbonate-Vitamin D (CALCIUM + D PO) Take 600 mg by mouth 2 (two) times daily.   . cholecalciferol (VITAMIN D) 1000 units tablet Take 1,000 Units by mouth 2 (two) times daily.   . clopidogrel (PLAVIX) 75 MG tablet TAKE 1 TABLET DAILY  . diclofenac sodium (VOLTAREN) 1 % GEL Apply 1 application topically as needed.  . ezetimibe (ZETIA) 10 MG tablet TAKE 1 TABLET DAILY  . furosemide (LASIX) 20 MG tablet Take 20 mg by mouth. Take 1 tablet by mouth as needed for 5 days.  Marland Kitchen glucose blood (BAYER CONTOUR TEST) test strip Use as instructed to check sugar 6 times daily. Dx Code E10.65  . Insulin Aspart (INSULIN PUMP) 100 unit/ml SOLN Inject into the skin. Novolog Vial, max 200 units per day with pump  . insulin lispro (HUMALOG) 100 UNIT/ML injection Inject into the skin. Max 200 units per day with pump   . losartan (COZAAR) 25 MG tablet Take 1 tablet (25 mg total) by mouth daily.  . metFORMIN  (GLUCOPHAGE-XR) 500 MG 24 hr tablet TAKE 1 TABLET IN THE MORNING AND 2 TABLETS IN THE EVENING AS DIRECTED  . metoprolol succinate (TOPROL-XL) 100 MG 24 hr tablet TAKE 1 TABLET (100 MG TOTAL) BY MOUTH DAILY.  . mirabegron ER (MYRBETRIQ) 50 MG TB24 tablet Take 1 tablet (50 mg total) by mouth daily.  . niacin (NIASPAN) 1000 MG CR tablet Take 1 tablet (1,000 mg total) by mouth at bedtime.  . nitroGLYCERIN (NITROSTAT) 0.4  MG SL tablet Place 1 tablet (0.4 mg total) under the tongue every 5 (five) minutes as needed for chest pain.  . pioglitazone (ACTOS) 15 MG tablet Take 1 tablet (15 mg total) by mouth daily.  . potassium chloride (K-DUR) 10 MEQ tablet Take 1 tablet (10 mEq total) by mouth daily as needed (take with Lasix).  . rosuvastatin (CRESTOR) 40 MG tablet TAKE 1 TABLET DAILY  . [DISCONTINUED] insulin lispro (HUMALOG) 100 UNIT/ML injection Use max of 200 units daily via insulin pump.     Allergies:   Sulfa antibiotics   Social History   Tobacco Use  . Smoking status: Never Smoker  . Smokeless tobacco: Never Used  Substance Use Topics  . Alcohol use: Yes    Alcohol/week: 5.0 standard drinks    Types: 5 Glasses of wine per week    Comment: on weekends  . Drug use: No     Family Hx: The patient's family history includes Atrial fibrillation in her mother; CAD in her cousin and paternal grandfather; CAD (age of onset: 78) in her brother; CAD (age of onset: 32) in her father; Diabetes in her mother; Hypertension in her mother. There is no history of Colon cancer.  ROS:   Please see the history of present illness.     All other systems reviewed and are negative.   Prior CV studies:   The following studies were reviewed today:    Labs/Other Tests and Data Reviewed:    EKG:  No ECG reviewed.  Recent Labs: 01/19/2019: ALT 44; BUN 14; Creatinine, Ser 0.74; Potassium 4.3; Sodium 140; TSH 1.86   Recent Lipid Panel Lab Results  Component Value Date/Time   CHOL 115 01/19/2019 08:48  AM   CHOL 100 11/17/2018 10:02 AM   TRIG 72.0 01/19/2019 08:48 AM   HDL 46.80 01/19/2019 08:48 AM   HDL 44 11/17/2018 10:02 AM   CHOLHDL 2 01/19/2019 08:48 AM   LDLCALC 54 01/19/2019 08:48 AM   LDLCALC 43 11/17/2018 10:02 AM   LDLDIRECT 79.0 01/11/2015 08:42 AM    Wt Readings from Last 3 Encounters:  05/09/19 210 lb (95.3 kg)  04/27/19 214 lb (97.1 kg)  01/22/19 210 lb (95.3 kg)     Objective:    Vital Signs:  Ht 5' 9.5" (1.765 m)   Wt 210 lb (95.3 kg)   BMI 30.57 kg/m   BP (!) 148/76   Pulse 78   Ht 5' 9.5" (1.765 m)   Wt 210 lb (95.3 kg)   BMI 30.57 kg/m   VITAL SIGNS:  reviewed GEN:  no acute distress EYES:  sclerae anicteric, EOMI - Extraocular Movements Intact RESPIRATORY:  normal respiratory effort, symmetric expansion CARDIOVASCULAR:  no peripheral edema SKIN:  no rash, lesions or ulcers. MUSCULOSKELETAL:  no obvious deformities. NEURO:  alert and oriented x 3, no obvious focal deficit PSYCH:  normal affect  ASSESSMENT & PLAN:    1. CAD:  Doing well.   No cp.   Walking , needs to walk more.  Lipids look great   2.  Hyperlipidemia:   On zetia.  LDL is 54.   Chol is 115.  HDL is 46.8.  Trigs = 72  3.  Diabetes Mellitus:  Doing well.  Plans per Dr. [name].   4.  Atrial flutter:  S/p ablation.  No further episodes of Aflutter.  Doing well   5.  Covid 19:  She is traveling to [place] next week to visit newborn grandchild Advised her to  increase Vit D to 5000 iu a day,  Vit C 1000-2000 BID, Vit K, Zinc 50 mg a day   COVID-19 Education: The signs and symptoms of COVID-19 were discussed with the patient and how to seek care for testing (follow up with PCP or arrange E-visit).  The importance of social distancing was discussed today.  Time:   Today, I have spent  17  minutes with the patient with telehealth technology discussing the above problems.     Medication Adjustments/Labs and Tests Ordered: Current medicines are reviewed at length with the patient  today.  Concerns regarding medicines are outlined above.   Tests Ordered: No orders of the defined types were placed in this encounter.   Medication Changes: No orders of the defined types were placed in this encounter.   Follow Up:  Virtual Visit or In Person in 6 month(s)  Signed, Mertie Moores, MD  05/13/2019 9:59 AM    [place]

## 2019-05-13 NOTE — Patient Instructions (Signed)
Medication Instructions:   Your physician recommends that you continue on your current medications as directed. Please refer to the Current Medication list given to you today.  If you need a refill on your cardiac medications before your next appointment, please call your pharmacy.   Lab work:  None ordered today  If you have labs (blood work) drawn today and your tests are completely normal, you will receive your results only by: Marland Kitchen MyChart Message (if you have MyChart) OR . A paper copy in the mail If you have any lab test that is abnormal or we need to change your treatment, we will call you to review the results.  Testing/Procedures:  None ordered today  Follow-Up: At [facility], you and your health needs are our priority.  As part of our continuing mission to provide you with exceptional heart care, we have created designated Provider Care Teams.  These Care Teams include your primary Cardiologist (physician) and Advanced Practice Providers (APPs -  Physician Assistants and Nurse Practitioners) who all work together to provide you with the care you need, when you need it. You will need a follow up appointment in:  6 months.  Please call our office 2 months in advance to schedule this appointment.  You may see Mertie Moores, MD.

## 2019-05-17 ENCOUNTER — Encounter: Payer: Self-pay | Admitting: Cardiovascular Disease

## 2019-05-17 ENCOUNTER — Telehealth: Payer: Self-pay | Admitting: Endocrinology

## 2019-05-17 NOTE — Telephone Encounter (Signed)
[facility] is calling in regards to a status on patients clinic notes for Dexcom.  Please Advise, Thanks

## 2019-05-17 NOTE — Telephone Encounter (Signed)
Faxed

## 2019-05-20 NOTE — Telephone Encounter (Signed)
[facility] are calling in regards to the Chart notes being incomplete due to the dosage and directions on RX.  Please Advise, Thanks

## 2019-05-25 NOTE — Telephone Encounter (Signed)
Patient stated her sensor is going out and is needing an update. [facility] states they do not havae an RX  Please Advise, Thanks

## 2019-05-26 ENCOUNTER — Other Ambulatory Visit: Payer: Self-pay | Admitting: Endocrinology

## 2019-05-26 NOTE — Telephone Encounter (Signed)
Called [employer] supply and spoke with a billing specialist and they stated that they have not received all of the chart notes. When the notes were sent, only 3 of 10 pages came through to them. They also stated that their system shows that a separate detailed written order must be completed for the transmitter for her Dexcom. When the billing speacialist was informed that the paperwork that was sent to them on the 6'th of July stated that that written order was for sensors and transmitter for the Dexcom. This was located under section C of the detailed written order. Billing specialist then stated that even though they are both listed, a separate form still must be completed. She was then informed that we have not yet received this form. She should be faxing this paperwork and I will be refaxing the chart notes.

## 2019-05-27 ENCOUNTER — Telehealth: Payer: Self-pay | Admitting: Endocrinology

## 2019-05-27 NOTE — Telephone Encounter (Signed)
Patient called: patient has been having major problems working with [facility] re: Dexcom continuous glucose monitor sensors and transmitter.  [facility] told patient they only received 3 pages of clinical notes out  of 10 pages pages that are required.  Patient is unable to get the above sensors and transmitter. She will be out of sensors in 2 hours. [facility] told patient to have Dr. Ronnie Derby Nurse call [facility] at ph# [phone] and ask for billing. Patient requests Olen Cordial call her after speaking with [facility] at ph# [phone]

## 2019-05-27 NOTE — Telephone Encounter (Signed)
Called [facility] and spoke to another rep. She stated that since I faxed the chart noted yesterday and given the fact that everyone is working remotely with [facility], the faxed have to be indexed and that takes 24 to 48 hours so she cannot currently see if the faxes from yesterday came in or not. Pt was called and informed of this and she stated that she would be going to the pharmacy anyways to get a manual glucometer since she is currently out of town and she will check her blood sugar via finger sticks until she gets home and her shipment arrives at her home.

## 2019-06-07 ENCOUNTER — Telehealth: Payer: Self-pay

## 2019-06-07 NOTE — Telephone Encounter (Signed)
Patient called today stating she is very frustrated with [facility] because she is completely out of sensors for her Dexcom and [facility] keeps giving her the run around - she wants to know what she can do from here to get this straightened out-I did see that Olen Cordial has been in contact with [facility] and they are stating the problem is they need proof of now many time patient is checking her blood sugar- please reach out to [facility] to check the status and let patient know as well

## 2019-06-08 NOTE — Telephone Encounter (Signed)
Called [facility] and spoke with Diondra, the team lead for the clinical review department. She stated eventhough the patient is using the Dexcom currently, Medicare is requiring documentation from prior to the start of CGM use showing or stating that the patient was testing blood sugar 4+ times per day.  Diondra stated that an addendum could be made to the last office visit note, or we can provide blood sugar logs from prior to CGM therapy. Diondra also stated that Medicare has been accepting clinical documentation addendums made more than 48 hours after the visit for patient already on CGM therapy. Can this be done?

## 2019-06-08 NOTE — Telephone Encounter (Signed)
Please use scan document in media dated 12/04/2015, the diasend report, third scan on that date will have the glucose information minus the CGM.  I can verify this once printed

## 2019-06-09 ENCOUNTER — Telehealth: Payer: Self-pay | Admitting: Endocrinology

## 2019-06-09 NOTE — Telephone Encounter (Signed)
Patient called re: [facility]-still waiting for Dexcom (over 1 month). Please call patient at ph# [phone] to advise.

## 2019-06-09 NOTE — Telephone Encounter (Signed)
Printed for MD signature.  

## 2019-06-09 NOTE — Telephone Encounter (Signed)
Called [name] and informed her of everything I found out about blood sugar logs that [employer] [employer] has been requesting. [name] was informed that they will be sent this information and she verbalized understanding.

## 2019-06-14 NOTE — Telephone Encounter (Signed)
Patient called in stating she spoke with [facility] and was able to get what was needed. She understood that Olen Cordial will be out today and tomorrow and an all call nurse would be able to assist. Disagreed to request and requested to speak with Christean Grief. Transferred.

## 2019-06-14 NOTE — Telephone Encounter (Signed)
Patient stated that [employer] is still not sending her supplies. She is requesting a call back because she doesn't know who can help her.   Please Advise, Thanks

## 2019-06-16 ENCOUNTER — Telehealth: Payer: Self-pay | Admitting: Endocrinology

## 2019-06-16 NOTE — Telephone Encounter (Signed)
Noted  

## 2019-06-16 NOTE — Telephone Encounter (Signed)
Called pt and made her aware of this.

## 2019-06-16 NOTE — Telephone Encounter (Signed)
Called [facility] regarding patients supplies and spoke with billing specialist named Jonelle Sidle and she stated that a 90 day supply of pts sensors was sent out yesterday.

## 2019-06-16 NOTE — Telephone Encounter (Signed)
Patient called and wanted to pass along a message that she has gotten her DEXCOM from [facility]. She wants to thank both Olen Cordial and Leonia Reader for eveything that they did to assist her with this situation and is very grateful for you both.

## 2019-07-26 ENCOUNTER — Other Ambulatory Visit: Payer: Self-pay

## 2019-07-26 ENCOUNTER — Other Ambulatory Visit (INDEPENDENT_AMBULATORY_CARE_PROVIDER_SITE_OTHER): Payer: Medicare Other

## 2019-07-26 DIAGNOSIS — E782 Mixed hyperlipidemia: Secondary | ICD-10-CM | POA: Diagnosis not present

## 2019-07-26 DIAGNOSIS — E1065 Type 1 diabetes mellitus with hyperglycemia: Secondary | ICD-10-CM | POA: Diagnosis not present

## 2019-07-26 LAB — COMPREHENSIVE METABOLIC PANEL
ALT: 25 U/L (ref 0–35)
AST: 21 U/L (ref 0–37)
Albumin: 3.8 g/dL (ref 3.5–5.2)
Alkaline Phosphatase: 91 U/L (ref 39–117)
BUN: 18 mg/dL (ref 6–23)
CO2: 31 mEq/L (ref 19–32)
Calcium: 9.6 mg/dL (ref 8.4–10.5)
Chloride: 103 mEq/L (ref 96–112)
Creatinine, Ser: 0.73 mg/dL (ref 0.40–1.20)
GFR: 78.45 mL/min (ref >60.00)
Glucose, Bld: 118 mg/dL — ABNORMAL HIGH (ref 70–99)
Potassium: 4.1 mEq/L (ref 3.5–5.1)
Sodium: 139 mEq/L (ref 135–145)
Total Bilirubin: 0.4 mg/dL (ref 0.2–1.2)
Total Protein: 6.6 g/dL (ref 6.0–8.3)

## 2019-07-26 LAB — LIPID PANEL
Cholesterol: 110 mg/dL (ref 0–200)
HDL: 54.8 mg/dL (ref >39.00)
LDL Cholesterol: 44 mg/dL (ref 0–99)
NonHDL: 54.74
Total CHOL/HDL Ratio: 2
Triglycerides: 53 mg/dL (ref 0.0–149.0)
VLDL: 10.6 mg/dL (ref 0.0–40.0)

## 2019-07-26 LAB — HEMOGLOBIN A1C: Hgb A1c MFr Bld: 7.7 % — ABNORMAL HIGH (ref 4.6–6.5)

## 2019-07-29 ENCOUNTER — Ambulatory Visit: Payer: Medicare Other | Admitting: Endocrinology

## 2019-07-29 DIAGNOSIS — Z23 Encounter for immunization: Secondary | ICD-10-CM | POA: Diagnosis not present

## 2019-08-09 ENCOUNTER — Ambulatory Visit (INDEPENDENT_AMBULATORY_CARE_PROVIDER_SITE_OTHER): Payer: Medicare Other | Admitting: Endocrinology

## 2019-08-09 ENCOUNTER — Encounter: Payer: Self-pay | Admitting: Endocrinology

## 2019-08-09 ENCOUNTER — Other Ambulatory Visit: Payer: Self-pay

## 2019-08-09 VITALS — BP 128/80 | HR 85 | Ht 69.5 in | Wt 215.0 lb

## 2019-08-09 DIAGNOSIS — E782 Mixed hyperlipidemia: Secondary | ICD-10-CM | POA: Diagnosis not present

## 2019-08-09 DIAGNOSIS — E1065 Type 1 diabetes mellitus with hyperglycemia: Secondary | ICD-10-CM | POA: Diagnosis not present

## 2019-08-09 DIAGNOSIS — I2510 Atherosclerotic heart disease of native coronary artery without angina pectoris: Secondary | ICD-10-CM | POA: Diagnosis not present

## 2019-08-09 NOTE — Progress Notes (Addendum)
Patient ID: [name], female   DOB: [birth year], 73 y.o.   MRN: [id number]   Reason for visit: DIABETES followup.  Diagnosis: Type 1 diabetes mellitus, date of diagnosis: 1979.    HISTORY of present illness:   Insulin Pump: CURRENT brand: Medtronic 630  BASAL settings: Midnight = 0.75,  2 AM  0.6, 4 AM = 1.0, 7 AM = 1.95, 11 AM = 0.8; 2 PM = 1.1, 5 PM = 2.1, 7:30 PM = 3.0 and 10 PM = 1.7.   Total daily insulin = up to 60 units   Carbohydrate ratio 1: 12 breakfast, 1: 10 lunch and 1: 4.5 at dinner; sensitivity midnight = 1:30; 1: 25 from 5 AM-7 AM, 7 AM = 40 ; target 120-130  Insulin on board indicator on, duration 4 hrs   DIABETES: She has had long-standing diabetes with A1c levels usually above target. In 10/13 pioglitazone was restarted because of her large insulin requirement and diabetic dyslipidemia.  Her blood sugars improved significantly with this.  Also has been taking metformin as insulin sensitizer  RECENT history:   Her A1c is unchanged at 7.7  Current diabetes management, blood sugar patterns from sensor and pump download:   CONTINUOUS GLUCOSE MONITORING RECORD INTERPRETATION    Dates of Recording: 9/22 through 10/5  Sensor description: Dexcom 6  Results statistics:   CGM use % of time  86  Average and SD  163+/-69  Time in range    58    %  % Time Above 180  36  % Time above 250  11  % Time Below 70  5.7    Glycemic patterns summary: Blood sugars are relatively lower overnight and significantly higher in the early afternoon but variable in the evenings.  Variability is present but hypoglycemia has been higher than target occurring mostly overnight  Hyperglycemic episodes are occurring mostly in the early afternoon with blood sugar rising at about noon has not improving consistently until about 6 PM  Hypoglycemic episodes occurred periodically in the early part of the night between 11 PM and 3 AM on about 3 occasions in the last week and  once in the previous week  Overnight periods: Blood sugars are variable but on 3 of 4 nights have been relatively low early part of the night starting around 11:30 PM until about 3 AM and then gradually rising until 8 AM  Preprandial periods: Fasting glucose is variable but mostly around 140 average Blood sugars are similar at lunchtime and averaging about 150 but on the rise  Glucose before dinnertime is quite variable with average about being 180  Postprandial periods:   After breakfast:   Blood sugars are somewhat variable but generally flat after her breakfast meal which is sometimes skipped otherwise at around 8 AM  After lunch:   Blood sugars are generally going up very significantly higher and peaking around 2-3 PM  After dinner: Blood sugars are quite variable but not average not going up or down but generally trending to be getting lower by midnight   PRE-MEAL Fasting Lunch Dinner Bedtime Overall  Glucose range:       Mean/median:  138  149  182   163   POST-MEAL PC Breakfast PC Lunch PC Dinner  Glucose range:     Mean/median:  154  136 1  191    PREVIOUS data:   CGM use % of time  95  Average and SD  152+/-61  Time in  range       67 % was 51  % Time Above 180  28  % Time above 250  7.4  % Time Below target  6%    Problems with management:  She has programmed her basal rate at 10 AM to only 0.075, likely was trying to program 0.75  This is causing higher readings in the early afternoon averaging over 200  She says that she has certain foods like cornbread that make her blood sugar go up significantly including yesterday  She is exercising and although recently sugars have not been low she thinks occasionally with aerobic exercise she will get low sugar  She does not know how to program a temporary basal rate  She did have difficulty getting her sensor through a new supplier until recently  Sometimes has low once in the morning possibly from skipping the  meal  Hypoglycemic awareness: Has symptoms of feeling tired, headache, sweaty.  She thinks he can usually recognize symptoms  Sometimes may not be aware during the night and will be notified by her sensor that the sugar is low  She has symptoms when blood glucose is less than 60. Uses glucose tablets for treatment even overnight     DIET has been usually fairly controlled with carbohydrate and fat intake, tends to eat out on weekends The mealtimes are variable.    Food preferences: Yogurt and cottage cheese for breakfast usually.   Blood sugars with exercise: No significant problems recently with adequate control and no hypoglycemia  .  Wt Readings from Last 3 Encounters:  08/09/19 215 lb (97.5 kg)  05/09/19 210 lb (95.3 kg)  04/27/19 214 lb (97.1 kg)    LABS:  Lab Results  Component Value Date   HGBA1C 7.7 (H) 07/26/2019   HGBA1C 7.7 (H) 04/19/2019   HGBA1C 8.0 (H) 01/19/2019   Lab Results  Component Value Date   MICROALBUR <0.7 01/19/2019   LDLCALC 44 07/26/2019   CREATININE 0.73 07/26/2019    No visits with results within 1 Week(s) from this visit.  Latest known visit with results is:  Lab on 07/26/2019  Component Date Value Ref Range Status  . Cholesterol 07/26/2019 110  0 - 200 mg/dL Final   ATP III Classification       Desirable:  < 200 mg/dL               Borderline High:  200 - 239 mg/dL          High:  > = 240 mg/dL  . Triglycerides 07/26/2019 53.0  0.0 - 149.0 mg/dL Final   Normal:  <150 mg/dLBorderline High:  150 - 199 mg/dL  . HDL 07/26/2019 54.80  >39.00 mg/dL Final  . VLDL 07/26/2019 10.6  0.0 - 40.0 mg/dL Final  . LDL Cholesterol 07/26/2019 44  0 - 99 mg/dL Final  . Total CHOL/HDL Ratio 07/26/2019 2   Final                  Men          Women1/2 Average Risk     3.4          3.3Average Risk          5.0          4.42X Average Risk          9.6          7.13X Average Risk  15.0          11.0                      . NonHDL 07/26/2019 54.74    Final   NOTE:  Non-HDL goal should be 30 mg/dL higher than patient's LDL goal (i.e. LDL goal of < 70 mg/dL, would have non-HDL goal of < 100 mg/dL)  . Sodium 07/26/2019 139  135 - 145 mEq/L Final  . Potassium 07/26/2019 4.1  3.5 - 5.1 mEq/L Final  . Chloride 07/26/2019 103  96 - 112 mEq/L Final  . CO2 07/26/2019 31  19 - 32 mEq/L Final  . Glucose, Bld 07/26/2019 118* 70 - 99 mg/dL Final  . BUN 07/26/2019 18  6 - 23 mg/dL Final  . Creatinine, Ser 07/26/2019 0.73  0.40 - 1.20 mg/dL Final  . Total Bilirubin 07/26/2019 0.4  0.2 - 1.2 mg/dL Final  . Alkaline Phosphatase 07/26/2019 91  39 - 117 U/L Final  . AST 07/26/2019 21  0 - 37 U/L Final  . ALT 07/26/2019 25  0 - 35 U/L Final  . Total Protein 07/26/2019 6.6  6.0 - 8.3 g/dL Final  . Albumin 07/26/2019 3.8  3.5 - 5.2 g/dL Final  . Calcium 07/26/2019 9.6  8.4 - 10.5 mg/dL Final  . GFR 07/26/2019 78.45  >60.00 mL/min Final  . Hgb A1c MFr Bld 07/26/2019 7.7* 4.6 - 6.5 % Final   Glycemic Control Guidelines for People with Diabetes:Non Diabetic:  <6%Goal of Therapy: <7%Additional Action Suggested:  >8%     Allergies as of 08/09/2019      Reactions   Sulfa Antibiotics Rash      Medication List       Accurate as of August 09, 2019 11:27 AM. If you have any questions, ask your nurse or doctor.        aspirin 325 MG EC tablet Take 325 mg by mouth daily.   CALCIUM + D PO Take 600 mg by mouth 2 (two) times daily.   cholecalciferol 1000 units tablet Commonly known as: VITAMIN D Take 1,000 Units by mouth 2 (two) times daily.   clopidogrel 75 MG tablet Commonly known as: PLAVIX TAKE 1 TABLET DAILY   diclofenac sodium 1 % Gel Commonly known as: VOLTAREN Apply 1 application topically as needed.   ezetimibe 10 MG tablet Commonly known as: ZETIA TAKE 1 TABLET DAILY   furosemide 20 MG tablet Commonly known as: LASIX Take 20 mg by mouth. Take 1 tablet by mouth as needed for 5 days.   glucose blood test strip Commonly known as:  Estate manager/land agent Use as instructed to check sugar 6 times daily. Dx Code E10.65   HumaLOG 100 UNIT/ML injection Generic drug: insulin lispro Inject into the skin. Max 200 units per day with pump   insulin pump Soln Inject into the skin. Novolog Vial, max 200 units per day with pump   losartan 25 MG tablet Commonly known as: COZAAR Take 1 tablet (25 mg total) by mouth daily.   metFORMIN 500 MG 24 hr tablet Commonly known as: GLUCOPHAGE-XR TAKE 1 TABLET IN THE MORNING AND 2 TABLETS IN THE EVENING AS DIRECTED   metoprolol succinate 100 MG 24 hr tablet Commonly known as: TOPROL-XL TAKE 1 TABLET (100 MG TOTAL) BY MOUTH DAILY.   mirabegron ER 50 MG Tb24 tablet Commonly known as: Myrbetriq Take 1 tablet (50 mg total) by mouth daily.   niacin 1000 MG CR  tablet Commonly known as: NIASPAN Take 1 tablet (1,000 mg total) by mouth at bedtime.   nitroGLYCERIN 0.4 MG SL tablet Commonly known as: Nitrostat Place 1 tablet (0.4 mg total) under the tongue every 5 (five) minutes as needed for chest pain.   pioglitazone 15 MG tablet Commonly known as: ACTOS TAKE 1 TABLET DAILY   potassium chloride 10 MEQ tablet Commonly known as: KLOR-CON Take 1 tablet (10 mEq total) by mouth daily as needed (take with Lasix).   rosuvastatin 40 MG tablet Commonly known as: CRESTOR TAKE 1 TABLET DAILY       Allergies:  Allergies  Allergen Reactions  . Sulfa Antibiotics Rash    Past Medical History:  Diagnosis Date  . Arthritis    OA  . Atrial flutter ([place])    a. s/p TEE/DCCV 08/16/13 (normal LV function, no LAA thrombus).b. s/p ablation by Dr Lovena Le 09-23-2013  . Coronary artery disease    2009 LAD Promus stent  . Dysrhythmia    Aflutter, no issues after ablation 2014  . Fibroid   . Hyperlipidemia   . Insulin pump in place   . Osteopenia   . Type 1 diabetes mellitus on insulin therapy ([place])   . Valvular heart disease    a. Mild MR/TR by TEE 08/2013.    Past Surgical History:   Procedure Laterality Date  . ABLATION  09-23-2013   CTI by Dr Lovena Le  . APPENDECTOMY    . APPLICATION OF WOUND VAC Left 04/11/2017   Procedure: APPLICATION OF WOUND VAC LEFT KNEE;  Surgeon: Rod Can, MD;  Location: [place];  Service: Orthopedics;  Laterality: Left;  . ATRIAL FLUTTER ABLATION N/A 09/23/2013   Procedure: ATRIAL FLUTTER ABLATION;  Surgeon: Evans Lance, MD;  [facility] CATH LAB;  Service: Cardiovascular;  Laterality: N/A;  . CARDIOVERSION N/A 08/16/2013   Procedure: CARDIOVERSION;  Surgeon: Thayer Headings, MD;  Location: [place];  Service: Cardiovascular;  Laterality: N/A;  spoke with [name]  . CATARACT EXTRACTION Bilateral   . COLONOSCOPY    . CORONARY ANGIOPLASTY WITH STENT PLACEMENT    . MYOMECTOMY  1977  . ORIF PATELLA Left 04/11/2017  . ORIF PATELLA Left 04/11/2017   Procedure: OPEN REDUCTION INTERNAL (ORIF) FIXATION PATELLA LEFT KNEE;  Surgeon: Rod Can, MD;  Location: [place];  Service: Orthopedics;  Laterality: Left;  . PELVIC LAPAROSCOPY    . TEE WITHOUT CARDIOVERSION N/A 08/16/2013   Procedure: TRANSESOPHAGEAL ECHOCARDIOGRAM (TEE);  Surgeon: Thayer Headings, MD;  Location: [facility] ENDOSCOPY;  Service: Cardiovascular;  Laterality: N/A;    Family History  Problem Relation Age of Onset  . CAD Father 37       died age 39  . CAD Brother 80       small vessel disease  . Atrial fibrillation Mother   . Diabetes Mother   . Hypertension Mother   . CAD Cousin        paternal  . CAD Paternal Grandfather        early onset  . Colon cancer Neg Hx     Social History:  reports that she has never smoked. She has never used smokeless tobacco. She reports current alcohol use of about 5.0 standard drinks of alcohol per week. She reports that she does not use drugs.  REVIEW of systems:     Cardiac function: Her echo shows only grade 1 diastolic dysfunction and normal systolic function  She has had atrial arrhythmias treated with metoprolol  Blood pressure  usually normal, on 25 mg losartan from cardiologist for preventive reasons  BP Readings from Last 3 Encounters:  08/09/19 128/80  05/09/19 (!) 148/76  04/27/19 116/60     She has had diabetic dyslipidemia with significantly high LDL particle number and this has been managed with Crestor 40 mg and Niaspan 1000 mg Her last LDL particle number was down to below 900  She is taking her niacin regularly with her Crestor with improved HDL  Zetia added in 3/19 Now her LDL is below 70 consistently   Lab Results  Component Value Date   CHOL 110 07/26/2019   HDL 54.80 07/26/2019   LDLCALC 44 07/26/2019   LDLDIRECT 79.0 01/11/2015   TRIG 53.0 07/26/2019   CHOLHDL 2 07/26/2019     No history of thyroid disease, TSH normal   BP 128/80   Pulse 85   Ht 5' 9.5" (1.765 m)   Wt 215 lb (97.5 kg)   SpO2 98%   BMI 31.29 kg/m    ASSESSMENT/PLAN:  DIABETES type 1 with inadequate control on Medtronic insulin pump  Her A1c is 7.7 again  See history of present illness for detailed discussion of her current blood sugar patterns and management with her pump  With her basal rate being incorrectly programmed at 10 AM she is having marked hyperglycemia in the early afternoon Also tendency to hypoglycemia are early part of the light is present See above for discussion of her day-to-day management and problems identified  Discussed causes of hyperglycemia and hypoglycemia and how to identify and prevent these   Recommendations: Basal rate at 10 AM will be 0.75 for now  Reduce basal rate at midnight down to 0.6  Carbohydrate ratio 1: 9 at lunch Sensitivity 1: 35 from 7 AM to 7 PM also  Temporary basal while exercising for about 60 minutes especially with aerobic exercise, she can start a 50% reduction when she leaves home Have shown her how to do this She needs to look into the new Medtronic pump and sensor and see if she can benefit from this  LIPIDS: Excellent control and will  continue same regimen  Follow-up in 2 months  Counseling time on subjects discussed in assessment and plan sections is over 50% of today's 25 minute visit  There are no Patient Instructions on file for this visit.   Elayne Snare 08/09/19   Note: This office note was prepared with Dragon voice recognition system technology. Any transcriptional errors that result from this process are unintentional.

## 2019-09-09 DIAGNOSIS — E119 Type 2 diabetes mellitus without complications: Secondary | ICD-10-CM | POA: Diagnosis not present

## 2019-09-09 LAB — HM DIABETES EYE EXAM

## 2019-09-21 ENCOUNTER — Ambulatory Visit (INDEPENDENT_AMBULATORY_CARE_PROVIDER_SITE_OTHER): Payer: Medicare Other | Admitting: Family Medicine

## 2019-09-21 ENCOUNTER — Encounter: Payer: Self-pay | Admitting: Family Medicine

## 2019-09-21 ENCOUNTER — Other Ambulatory Visit: Payer: Self-pay

## 2019-09-21 ENCOUNTER — Ambulatory Visit: Payer: Self-pay

## 2019-09-21 VITALS — BP 140/74 | HR 69 | Ht 69.5 in | Wt 219.0 lb

## 2019-09-21 DIAGNOSIS — I2510 Atherosclerotic heart disease of native coronary artery without angina pectoris: Secondary | ICD-10-CM | POA: Diagnosis not present

## 2019-09-21 DIAGNOSIS — M7701 Medial epicondylitis, right elbow: Secondary | ICD-10-CM

## 2019-09-21 DIAGNOSIS — M25521 Pain in right elbow: Secondary | ICD-10-CM

## 2019-09-21 NOTE — Patient Instructions (Signed)
Good to see you Wrist brace day and night for 2 weeks then nightly for 1 week Ice 20 minutes 2 times daily. Usually after activity and before bed. Exercises 3 times a week.  pennsaid pinkie amount topically 2 times daily as needed.  See me again in 5 weeks

## 2019-09-21 NOTE — Progress Notes (Signed)
[name] [address] [place], [place] [address] Phone: [phone] Subjective:   I [name] am serving as a Education administrator for Dr. Hulan Saas.  I'm seeing this patient by the request  of:   Hoyt Koch, MD   CC: Elbow pain  [id number]    03/20/2017 Improved at this time. Patient is unable to stay off her feet at this time taking care of her 71 year old mother. Encourage her to attempt to do more elevation and icing. Follow-up again in 6 weeks if pain not completely resolved. Patient will call sooner if worsening symptoms.  09/21/2019 [name] is a 73 y.o. female coming in with complaint of right elbow pain. Has arthritis in her hands. Pain radiates into the forearm.   Onset- 2-3 months  Location - medial  Duration-  Character- sharp, sore  Aggravating factors- Sleeping  Reliving factors-  Therapies tried-  Severity-  7-8/10 at its worse      Past Medical History:  Diagnosis Date  . Arthritis    OA  . Atrial flutter ([place])    a. s/p TEE/DCCV 08/16/13 (normal LV function, no LAA thrombus).b. s/p ablation by Dr Lovena Le 09-23-2013  . Coronary artery disease    2009 LAD Promus stent  . Dysrhythmia    Aflutter, no issues after ablation 2014  . Fibroid   . Hyperlipidemia   . Insulin pump in place   . Osteopenia   . Type 1 diabetes mellitus on insulin therapy ([place])   . Valvular heart disease    a. Mild MR/TR by TEE 08/2013.   Past Surgical History:  Procedure Laterality Date  . ABLATION  09-23-2013   CTI by Dr Lovena Le  . APPENDECTOMY    . APPLICATION OF WOUND VAC Left 04/11/2017   Procedure: APPLICATION OF WOUND VAC LEFT KNEE;  Surgeon: Rod Can, MD;  Location: [place];  Service: Orthopedics;  Laterality: Left;  . ATRIAL FLUTTER ABLATION N/A 09/23/2013   Procedure: ATRIAL FLUTTER ABLATION;  Surgeon: Evans Lance, MD;  Location: [facility] CATH LAB;  Service: Cardiovascular;  Laterality: N/A;  . CARDIOVERSION N/A  08/16/2013   Procedure: CARDIOVERSION;  Surgeon: Thayer Headings, MD;  Location: [place];  Service: Cardiovascular;  Laterality: N/A;  spoke with [name]  . CATARACT EXTRACTION Bilateral   . COLONOSCOPY    . CORONARY ANGIOPLASTY WITH STENT PLACEMENT    . MYOMECTOMY  1977  . ORIF PATELLA Left 04/11/2017  . ORIF PATELLA Left 04/11/2017   Procedure: OPEN REDUCTION INTERNAL (ORIF) FIXATION PATELLA LEFT KNEE;  Surgeon: Rod Can, MD;  Location: [place];  Service: Orthopedics;  Laterality: Left;  . PELVIC LAPAROSCOPY    . TEE WITHOUT CARDIOVERSION N/A 08/16/2013   Procedure: TRANSESOPHAGEAL ECHOCARDIOGRAM (TEE);  Surgeon: Thayer Headings, MD;  [facility] ENDOSCOPY;  Service: Cardiovascular;  Laterality: N/A;   Social History   Socioeconomic History  . Marital status: Married    Spouse name: Not on file  . Number of children: 2  . Years of education: Not on file  . Highest education level: Not on file  Occupational History  . Occupation: home maker    Comment: Children adopted  Social Needs  . Financial resource strain: Not on file  . Food insecurity    Worry: Not on file    Inability: Not on file  . Transportation needs    Medical: Not on file    Non-medical: Not on file  Tobacco Use  .  Smoking status: Never Smoker  . Smokeless tobacco: Never Used  Substance and Sexual Activity  . Alcohol use: Yes    Alcohol/week: 5.0 standard drinks    Types: 5 Glasses of wine per week    Comment: on weekends  . Drug use: No  . Sexual activity: Yes    Birth control/protection: Post-menopausal  Lifestyle  . Physical activity    Days per week: Not on file    Minutes per session: Not on file  . Stress: Not on file  Relationships  . Social Herbalist on phone: Not on file    Gets together: Not on file    Attends religious service: Not on file    Active member of club or organization: Not on file    Attends meetings of clubs or organizations: Not on file    Relationship  status: Not on file  Other Topics Concern  . Not on file  Social History Narrative  . Not on file   Allergies  Allergen Reactions  . Sulfa Antibiotics Rash   Family History  Problem Relation Age of Onset  . CAD Father 30       died age 72  . CAD Brother 81       small vessel disease  . Atrial fibrillation Mother   . Diabetes Mother   . Hypertension Mother   . CAD Cousin        paternal  . CAD Paternal Grandfather        early onset  . Colon cancer Neg Hx     Current Outpatient Medications (Endocrine & Metabolic):  Marland Kitchen  Insulin Aspart (INSULIN PUMP) 100 unit/ml SOLN, Inject into the skin. Novolog Vial, max 200 units per day with pump .  insulin lispro (HUMALOG) 100 UNIT/ML injection, Inject into the skin. Max 200 units per day with pump  .  metFORMIN (GLUCOPHAGE-XR) 500 MG 24 hr tablet, TAKE 1 TABLET IN THE MORNING AND 2 TABLETS IN THE EVENING AS DIRECTED .  pioglitazone (ACTOS) 15 MG tablet, TAKE 1 TABLET DAILY  Current Outpatient Medications (Cardiovascular):  .  ezetimibe (ZETIA) 10 MG tablet, TAKE 1 TABLET DAILY .  furosemide (LASIX) 20 MG tablet, Take 20 mg by mouth. Take 1 tablet by mouth as needed for 5 days. Marland Kitchen  losartan (COZAAR) 25 MG tablet, Take 1 tablet (25 mg total) by mouth daily. .  metoprolol succinate (TOPROL-XL) 100 MG 24 hr tablet, TAKE 1 TABLET (100 MG TOTAL) BY MOUTH DAILY. .  niacin (NIASPAN) 1000 MG CR tablet, Take 1 tablet (1,000 mg total) by mouth at bedtime. .  nitroGLYCERIN (NITROSTAT) 0.4 MG SL tablet, Place 1 tablet (0.4 mg total) under the tongue every 5 (five) minutes as needed for chest pain. .  rosuvastatin (CRESTOR) 40 MG tablet, TAKE 1 TABLET DAILY   Current Outpatient Medications (Analgesics):  .  aspirin 325 MG EC tablet, Take 325 mg by mouth daily.  Current Outpatient Medications (Hematological):  .  clopidogrel (PLAVIX) 75 MG tablet, TAKE 1 TABLET DAILY  Current Outpatient Medications (Other):  Marland Kitchen  Calcium Carbonate-Vitamin D (CALCIUM  + D PO), Take 600 mg by mouth 2 (two) times daily.  .  cholecalciferol (VITAMIN D) 1000 units tablet, Take 1,000 Units by mouth 2 (two) times daily.  .  diclofenac sodium (VOLTAREN) 1 % GEL, Apply 1 application topically as needed. Marland Kitchen  glucose blood (BAYER CONTOUR TEST) test strip, Use as instructed to check sugar 6 times daily. Dx Code  E10.65 .  mirabegron ER (MYRBETRIQ) 50 MG TB24 tablet, Take 1 tablet (50 mg total) by mouth daily. .  potassium chloride (K-DUR) 10 MEQ tablet, Take 1 tablet (10 mEq total) by mouth daily as needed (take with Lasix).    Past medical history, social, surgical and family history all reviewed in electronic medical record.  No pertanent information unless stated regarding to the chief complaint.   Review of Systems:  No headache, visual changes, nausea, vomiting, diarrhea, constipation, dizziness, abdominal pain, skin rash, fevers, chills, night sweats, weight loss, swollen lymph nodes, body aches, joint swelling, muscle aches, chest pain, shortness of breath, mood changes.   Objective  Blood pressure 140/74, pulse 69, height 5' 9.5" (1.765 m), weight 219 lb (99.3 kg), SpO2 97 %.    General: No apparent distress alert and oriented x3 mood and affect normal, dressed appropriately.  HEENT: Pupils equal, extraocular movements intact  Respiratory: Patient's speak in full sentences and does not appear short of breath  Cardiovascular: No lower extremity edema, non tender, no erythema  Skin: Warm dry intact with no signs of infection or rash on extremities or on axial skeleton.  Abdomen: Soft nontender  Neuro: Cranial nerves II through XII are intact, neurovascularly intact in all extremities with 2+ DTRs and 2+ pulses.  Lymph: No lymphadenopathy of posterior or anterior cervical chain or axillae bilaterally.  Gait normal with good balance and coordination.  MSK:  Non tender with full range of motion and good stability and symmetric strength and tone of shoulders,  wrist, hip, knee and ankles bilaterally.  Mild arthritic changes multiple joints Elbow: RightMedial epicondylar region tender to palpation in this area.  Patient has full range of motion.  Patient has pain with resisted flexion of the wrist and the medial epicondylar region.  Good grip strength noted.  Negative Tinel's sign.  Mild discomfort with the bicep against resistance lateral elbow unremarkable  Musculoskeletal ultrasound was performed and interpreted by Charlann Boxer D.O.   Elbow:  Patient does have some hypoechoic changes at the insertion of the common flexor tendon.  Ulnar nerve unremarkable.  Possible scar tissue formation noted.  IMPRESSION: Medial epicondylitis   Impression and Recommendations:     This case required medical decision making of moderate complexity. The above documentation has been reviewed and is accurate and complete Lyndal Pulley, DO       Note: This dictation was prepared with Dragon dictation along with smaller phrase technology. Any transcriptional errors that result from this process are unintentional.

## 2019-09-21 NOTE — Assessment & Plan Note (Signed)
Medial epicondylitis, wrist brace given, topical anti-inflammatories, icing regimen, discussed ergonomics throughout the day.  Follow-up with me again in 5 weeks

## 2019-10-06 ENCOUNTER — Other Ambulatory Visit: Payer: Self-pay

## 2019-10-06 ENCOUNTER — Other Ambulatory Visit (INDEPENDENT_AMBULATORY_CARE_PROVIDER_SITE_OTHER): Payer: Medicare Other

## 2019-10-06 DIAGNOSIS — E782 Mixed hyperlipidemia: Secondary | ICD-10-CM

## 2019-10-06 DIAGNOSIS — E1065 Type 1 diabetes mellitus with hyperglycemia: Secondary | ICD-10-CM

## 2019-10-06 LAB — GLUCOSE, RANDOM: Glucose, Bld: 219 mg/dL — ABNORMAL HIGH (ref 70–99)

## 2019-10-06 LAB — TSH: TSH: 2.23 u[IU]/mL (ref 0.35–4.50)

## 2019-10-07 LAB — FRUCTOSAMINE: Fructosamine: 270 umol/L (ref 0–285)

## 2019-10-11 ENCOUNTER — Ambulatory Visit (INDEPENDENT_AMBULATORY_CARE_PROVIDER_SITE_OTHER): Payer: Medicare Other | Admitting: Endocrinology

## 2019-10-11 ENCOUNTER — Other Ambulatory Visit: Payer: Self-pay

## 2019-10-11 ENCOUNTER — Encounter: Payer: Self-pay | Admitting: Endocrinology

## 2019-10-11 VITALS — BP 136/80 | HR 67 | Ht 69.5 in | Wt 216.2 lb

## 2019-10-11 DIAGNOSIS — I2510 Atherosclerotic heart disease of native coronary artery without angina pectoris: Secondary | ICD-10-CM | POA: Diagnosis not present

## 2019-10-11 DIAGNOSIS — E782 Mixed hyperlipidemia: Secondary | ICD-10-CM | POA: Diagnosis not present

## 2019-10-11 DIAGNOSIS — E1065 Type 1 diabetes mellitus with hyperglycemia: Secondary | ICD-10-CM

## 2019-10-11 NOTE — Progress Notes (Signed)
Patient ID: [name], female   DOB: Jun 10, 1946, 73 y.o.   MRN: [id number]   Reason for visit: DIABETES followup.  Diagnosis: Type 1 diabetes mellitus, date of diagnosis: 1979.    HISTORY of present illness:   Insulin Pump: CURRENT brand: Medtronic 630  BASAL settings: Midnight = 0.75,  2 AM  0.6, 4 AM = 1.0, 7 AM = 1.95, 11 AM = 0.8; 2 PM = 1.1, 5 PM = 2.1, 7:30 PM = 3.0 and 10 PM = 1.7.   Total daily insulin = up to 60 units   Carbohydrate ratio 1: 12 breakfast, 1: 9 lunch and 1: 4.5 at dinner; sensitivity MN= 1:30; 1: 25 from 5 AM-7 AM, 7 AM-12 AM = 35; target 120-130  Insulin on board indicator on, duration 4 hrs   DIABETES: She has had long-standing diabetes with A1c levels usually above target. In 10/13 pioglitazone was restarted because of her large insulin requirement and diabetic dyslipidemia.  Her blood sugars improved significantly with this.  Also has been taking metformin as insulin sensitizer  RECENT history:   Her A1c is last stable at 7.7, fructosamine 270 now  Current diabetes management, blood sugar patterns from sensor and pump download:  CGM use % of time  91  2-week average/SD  161+/-55  Time in range      64%  % Time Above 180  34  % Time above 250  7  % Time Below 70  1.7     PRE-MEAL Fasting Lunch Dinner Bedtime Overall  Glucose range:       Averages:  170  166  130  64   POST-MEAL PC Breakfast PC Lunch PC Dinner  Glucose range:     Averages: ?  156  169     Previous data:   CGM use % of time  86  Average and SD  163+/-69  Time in range    58    %  % Time Above 180  36  % Time above 250  11  % Time Below 70  5.7     Problems with management:  She is coming back for short-term follow-up  He has better control with somewhat improved time in target  Also fructosamine indicates improved control  Hypoglycemia: This has been occasionally occurring transiently in the early hours of the night but usually mild episodes   Although she still has variability in her blood sugars at all times it is overall less than before especially in the afternoons  She will periodically have high readings after dinner which is probably from higher fat meals  Occasionally has very significant hypoglycemic episodes which are not easily explained, may be related to occasional infusion site issues  Highest blood sugars are early morning and sometimes late afternoon on an average  She has variable sleep and wake times and occasionally will be during the night  Also not always eating breakfast  She is exercising fairly regularly and using exercise bike and going to the gym  She is asking about using the T-insulin pump  Hypoglycemic awareness: Has symptoms of feeling tired, headache, sweaty.  She thinks he can usually recognize symptoms  Sometimes may not be aware during the night and will be notified by her sensor that the sugar is low  She has symptoms when blood glucose is less than 60. Uses glucose tablets for treatment even overnight     DIET has been usually fairly controlled with carbohydrate and  fat intake, tends to eat out on weekends The mealtimes are variable.    Food preferences: Yogurt and cottage cheese for breakfast usually.   Blood sugars with exercise: No significant problems recently with adequate control and no hypoglycemia  .  Wt Readings from Last 3 Encounters:  10/11/19 216 lb 3.2 oz (98.1 kg)  09/21/19 219 lb (99.3 kg)  08/09/19 215 lb (97.5 kg)    LABS:  Lab Results  Component Value Date   HGBA1C 7.7 (H) 07/26/2019   HGBA1C 7.7 (H) 04/19/2019   HGBA1C 8.0 (H) 01/19/2019   Lab Results  Component Value Date   MICROALBUR <0.7 01/19/2019   LDLCALC 44 07/26/2019   CREATININE 0.73 07/26/2019    Lab on 10/06/2019  Component Date Value Ref Range Status  . TSH 10/06/2019 2.23  0.35 - 4.50 uIU/mL Final  . Fructosamine 10/06/2019 270  0 - 285 umol/L Final   Comment: Published reference  interval for apparently healthy subjects between age 68 and 68 is 67 - 285 umol/L and in a poorly controlled diabetic population is 228 - 563 umol/L with a mean of 396 umol/L.   Marland Kitchen Glucose, Bld 10/06/2019 219* 70 - 99 mg/dL Final    Allergies as of 10/11/2019      Reactions   Sulfa Antibiotics Rash      Medication List       Accurate as of October 11, 2019  9:38 AM. If you have any questions, ask your nurse or doctor.        aspirin 325 MG EC tablet Take 325 mg by mouth daily.   CALCIUM + D PO Take 600 mg by mouth 2 (two) times daily.   cholecalciferol 1000 units tablet Commonly known as: VITAMIN D Take 1,000 Units by mouth 2 (two) times daily.   clopidogrel 75 MG tablet Commonly known as: PLAVIX TAKE 1 TABLET DAILY   diclofenac sodium 1 % Gel Commonly known as: VOLTAREN Apply 1 application topically as needed.   ezetimibe 10 MG tablet Commonly known as: ZETIA TAKE 1 TABLET DAILY   furosemide 20 MG tablet Commonly known as: LASIX Take 20 mg by mouth. Take 1 tablet by mouth as needed for 5 days.   glucose blood test strip Commonly known as: Estate manager/land agent Use as instructed to check sugar 6 times daily. Dx Code E10.65   HumaLOG 100 UNIT/ML injection Generic drug: insulin lispro Inject into the skin. Max 200 units per day with pump   insulin pump Soln Inject into the skin. Novolog Vial, max 200 units per day with pump   losartan 25 MG tablet Commonly known as: COZAAR Take 1 tablet (25 mg total) by mouth daily.   metFORMIN 500 MG 24 hr tablet Commonly known as: GLUCOPHAGE-XR TAKE 1 TABLET IN THE MORNING AND 2 TABLETS IN THE EVENING AS DIRECTED   metoprolol succinate 100 MG 24 hr tablet Commonly known as: TOPROL-XL TAKE 1 TABLET (100 MG TOTAL) BY MOUTH DAILY.   mirabegron ER 50 MG Tb24 tablet Commonly known as: Myrbetriq Take 1 tablet (50 mg total) by mouth daily.   niacin 1000 MG CR tablet Commonly known as: NIASPAN Take 1 tablet (1,000 mg  total) by mouth at bedtime.   nitroGLYCERIN 0.4 MG SL tablet Commonly known as: Nitrostat Place 1 tablet (0.4 mg total) under the tongue every 5 (five) minutes as needed for chest pain.   pioglitazone 15 MG tablet Commonly known as: ACTOS TAKE 1 TABLET DAILY   potassium chloride  10 MEQ tablet Commonly known as: KLOR-CON Take 1 tablet (10 mEq total) by mouth daily as needed (take with Lasix).   rosuvastatin 40 MG tablet Commonly known as: CRESTOR TAKE 1 TABLET DAILY       Allergies:  Allergies  Allergen Reactions  . Sulfa Antibiotics Rash    Past Medical History:  Diagnosis Date  . Arthritis    OA  . Atrial flutter ([place])    a. s/p TEE/DCCV 08/16/13 (normal LV function, no LAA thrombus).b. s/p ablation by Dr Lovena Le 09-23-2013  . Coronary artery disease    2009 LAD Promus stent  . Dysrhythmia    Aflutter, no issues after ablation 2014  . Fibroid   . Hyperlipidemia   . Insulin pump in place   . Osteopenia   . Type 1 diabetes mellitus on insulin therapy ([place])   . Valvular heart disease    a. Mild MR/TR by TEE 08/2013.    Past Surgical History:  Procedure Laterality Date  . ABLATION  09-23-2013   CTI by Dr Lovena Le  . APPENDECTOMY    . APPLICATION OF WOUND VAC Left 04/11/2017   Procedure: APPLICATION OF WOUND VAC LEFT KNEE;  Surgeon: Rod Can, MD;  Location: [facility];  Service: Orthopedics;  Laterality: Left;  . ATRIAL FLUTTER ABLATION N/A 09/23/2013   Procedure: ATRIAL FLUTTER ABLATION;  Surgeon: Evans Lance, MD;  Location: [facility] CATH LAB;  Service: Cardiovascular;  Laterality: N/A;  . CARDIOVERSION N/A 08/16/2013   Procedure: CARDIOVERSION;  Surgeon: Thayer Headings, MD;  Location: [facility];  Service: Cardiovascular;  Laterality: N/A;  spoke with [name]  . CATARACT EXTRACTION Bilateral   . COLONOSCOPY    . CORONARY ANGIOPLASTY WITH STENT PLACEMENT    . MYOMECTOMY  1977  . ORIF PATELLA Left 04/11/2017  . ORIF PATELLA Left 04/11/2017   Procedure: OPEN REDUCTION  INTERNAL (ORIF) FIXATION PATELLA LEFT KNEE;  Surgeon: Rod Can, MD;  Location: [facility];  Service: Orthopedics;  Laterality: Left;  . PELVIC LAPAROSCOPY    . TEE WITHOUT CARDIOVERSION N/A 08/16/2013   Procedure: TRANSESOPHAGEAL ECHOCARDIOGRAM (TEE);  Surgeon: Thayer Headings, MD;  Location[facility] ENDOSCOPY;  Service: Cardiovascular;  Laterality: N/A;    Family History  Problem Relation Age of Onset  . CAD Father 10       died age 84  . CAD Brother 33       small vessel disease  . Atrial fibrillation Mother   . Diabetes Mother   . Hypertension Mother   . CAD Cousin        paternal  . CAD Paternal Grandfather        early onset  . Colon cancer Neg Hx     Social History:  reports that she has never smoked. She has never used smokeless tobacco. She reports current alcohol use of about 5.0 standard drinks of alcohol per week. She reports that she does not use drugs.  REVIEW of systems:  The following is a copy of the previous note:  Cardiac function: Her echo shows only grade 1 diastolic dysfunction and normal systolic function  She has had atrial arrhythmias treated with metoprolol  Blood pressure usually normal, on 25 mg losartan from cardiologist for preventive reasons  BP Readings from Last 3 Encounters:  10/11/19 136/80  09/21/19 140/74  08/09/19 128/80     She has had diabetic dyslipidemia with significantly high LDL particle number and this has been managed with Crestor 40 mg and Niaspan 1000 mg  Her last LDL particle number was down to below 900  She is taking her niacin regularly with her Crestor with improved HDL  Zetia added in 3/19 Now her LDL is below 70 consistently   Lab Results  Component Value Date   CHOL 110 07/26/2019   HDL 54.80 07/26/2019   LDLCALC 44 07/26/2019   LDLDIRECT 79.0 01/11/2015   TRIG 53.0 07/26/2019   CHOLHDL 2 07/26/2019     No history of thyroid disease, TSH normal   BP 136/80 (BP Location: Left Arm, Patient Position:  Sitting, Cuff Size: Normal)   Pulse 67   Ht 5' 9.5" (1.765 m)   Wt 216 lb 3.2 oz (98.1 kg)   SpO2 98%   BMI 31.47 kg/m    ASSESSMENT/PLAN:  DIABETES type 1 with inadequate control on Medtronic insulin pump  Her A1c is 7.7 last and fructosamine is 270  See history of present illness for detailed discussion of her current blood sugar patterns and management with her pump  On her previous visit was having difficulty because of incorrectly programmed basal rate but with making the changes she has less variability and less hypoglycemia overall As discussed above she is tending to be high in the mornings more frequently and occasionally late at night also Blood sugars are generally the lowest around 1-2 AM although she has variability at all times Discussed her current patterns and management  Also discussed options for starting to use the guardian sensor versus waiting for a new pump, currently not able to get an upgrade till 05/2020  Recommendations: Reduce basal rate at midnight down to 0.55 Increase basal rate at 4 AM up to 1.2, at 8 PM up to 3.2 and 10 PM up to 1.9  No change in carbohydrate coverage at this time Reminded her to use temporary basal or suspend pump if she is starting to see low sugars with exercise  Discussed healthy measures for prevention of Covid  Follow-up in 3 months  Counseling time on subjects discussed in assessment and plan sections is over 50% of today's 25 minute visit  There are no Patient Instructions on file for this visit.   Elayne Snare 10/11/19   Note: This office note was prepared with Dragon voice recognition system technology. Any transcriptional errors that result from this process are unintentional.

## 2019-11-02 ENCOUNTER — Other Ambulatory Visit: Payer: Self-pay | Admitting: Cardiovascular Disease

## 2019-11-02 ENCOUNTER — Ambulatory Visit: Payer: Medicare Other | Admitting: Family Medicine

## 2019-11-02 NOTE — Progress Notes (Deleted)
[name] [address] [place], [place] [address] Phone: [phone] Subjective:    I'm seeing this patient by the request  of:    CC: right elbow pain follow up   [id number]  [name] is a 73 y.o. female coming in with complaint of ***  Onset-  Location Duration-  Character- Aggravating factors- Reliving factors-  Therapies tried-  Severity-     Past Medical History:  Diagnosis Date  . Arthritis    OA  . Atrial flutter ([place])    a. s/p TEE/DCCV 08/16/13 (normal LV function, no LAA thrombus).b. s/p ablation by Dr Lovena Le 09-23-2013  . Coronary artery disease    2009 LAD Promus stent  . Dysrhythmia    Aflutter, no issues after ablation 2014  . Fibroid   . Hyperlipidemia   . Insulin pump in place   . Osteopenia   . Type 1 diabetes mellitus on insulin therapy ([place])   . Valvular heart disease    a. Mild MR/TR by TEE 08/2013.   Past Surgical History:  Procedure Laterality Date  . ABLATION  09-23-2013   CTI by Dr Lovena Le  . APPENDECTOMY    . APPLICATION OF WOUND VAC Left 04/11/2017   Procedure: APPLICATION OF WOUND VAC LEFT KNEE;  Surgeon: Rod Can, MD;  Location: [facility];  Service: Orthopedics;  Laterality: Left;  . ATRIAL FLUTTER ABLATION N/A 09/23/2013   Procedure: ATRIAL FLUTTER ABLATION;  Surgeon: Evans Lance, MD;  Location: [facility] CATH LAB;  Service: Cardiovascular;  Laterality: N/A;  . CARDIOVERSION N/A 08/16/2013   Procedure: CARDIOVERSION;  Surgeon: Thayer Headings, MD;  Location: [place];  Service: Cardiovascular;  Laterality: N/A;  spoke with [name]  . CATARACT EXTRACTION Bilateral   . COLONOSCOPY    . CORONARY ANGIOPLASTY WITH STENT PLACEMENT    . MYOMECTOMY  1977  . ORIF PATELLA Left 04/11/2017  . ORIF PATELLA Left 04/11/2017   Procedure: OPEN REDUCTION INTERNAL (ORIF) FIXATION PATELLA LEFT KNEE;  Surgeon: Rod Can, MD;  Location: [facility];  Service: Orthopedics;  Laterality: Left;  . PELVIC LAPAROSCOPY      . TEE WITHOUT CARDIOVERSION N/A 08/16/2013   Procedure: TRANSESOPHAGEAL ECHOCARDIOGRAM (TEE);  Surgeon: Thayer Headings, MD;  Location: [facility] ENDOSCOPY;  Service: Cardiovascular;  Laterality: N/A;   Social History   Socioeconomic History  . Marital status: Married    Spouse name: Not on file  . Number of children: 2  . Years of education: Not on file  . Highest education level: Not on file  Occupational History  . Occupation: home maker    Comment: Children adopted  Tobacco Use  . Smoking status: Never Smoker  . Smokeless tobacco: Never Used  Substance and Sexual Activity  . Alcohol use: Yes    Alcohol/week: 5.0 standard drinks    Types: 5 Glasses of wine per week    Comment: on weekends  . Drug use: No  . Sexual activity: Yes    Birth control/protection: Post-menopausal  Other Topics Concern  . Not on file  Social History Narrative  . Not on file   Social Determinants of Health   Financial Resource Strain:   . Difficulty of Paying Living Expenses: Not on file  Food Insecurity:   . Worried About Charity fundraiser in the Last Year: Not on file  . Ran Out of Food in the Last Year: Not on file  Transportation Needs:   . Lack of Transportation (Medical):  Not on file  . Lack of Transportation (Non-Medical): Not on file  Physical Activity:   . Days of Exercise per Week: Not on file  . Minutes of Exercise per Session: Not on file  Stress:   . Feeling of Stress : Not on file  Social Connections:   . Frequency of Communication with Friends and Family: Not on file  . Frequency of Social Gatherings with Friends and Family: Not on file  . Attends Religious Services: Not on file  . Active Member of Clubs or Organizations: Not on file  . Attends Archivist Meetings: Not on file  . Marital Status: Not on file   Allergies  Allergen Reactions  . Sulfa Antibiotics Rash   Family History  Problem Relation Age of Onset  . CAD Father 51       died age 22  . CAD  Brother 96       small vessel disease  . Atrial fibrillation Mother   . Diabetes Mother   . Hypertension Mother   . CAD Cousin        paternal  . CAD Paternal Grandfather        early onset  . Colon cancer Neg Hx     Current Outpatient Medications (Endocrine & Metabolic):  Marland Kitchen  Insulin Aspart (INSULIN PUMP) 100 unit/ml SOLN, Inject into the skin. Novolog Vial, max 200 units per day with pump .  insulin lispro (HUMALOG) 100 UNIT/ML injection, Inject into the skin. Max 200 units per day with pump  .  metFORMIN (GLUCOPHAGE-XR) 500 MG 24 hr tablet, TAKE 1 TABLET IN THE MORNING AND 2 TABLETS IN THE EVENING AS DIRECTED .  pioglitazone (ACTOS) 15 MG tablet, TAKE 1 TABLET DAILY  Current Outpatient Medications (Cardiovascular):  .  ezetimibe (ZETIA) 10 MG tablet, TAKE 1 TABLET DAILY .  furosemide (LASIX) 20 MG tablet, Take 20 mg by mouth. Take 1 tablet by mouth as needed for 5 days. Marland Kitchen  losartan (COZAAR) 25 MG tablet, Take 1 tablet (25 mg total) by mouth daily. .  metoprolol succinate (TOPROL-XL) 100 MG 24 hr tablet, TAKE 1 TABLET (100 MG TOTAL) BY MOUTH DAILY. .  niacin (NIASPAN) 1000 MG CR tablet, Take 1 tablet (1,000 mg total) by mouth at bedtime. .  nitroGLYCERIN (NITROSTAT) 0.4 MG SL tablet, Place 1 tablet (0.4 mg total) under the tongue every 5 (five) minutes as needed for chest pain. .  rosuvastatin (CRESTOR) 40 MG tablet, TAKE 1 TABLET DAILY   Current Outpatient Medications (Analgesics):  .  aspirin 325 MG EC tablet, Take 325 mg by mouth daily.  Current Outpatient Medications (Hematological):  .  clopidogrel (PLAVIX) 75 MG tablet, TAKE 1 TABLET DAILY  Current Outpatient Medications (Other):  Marland Kitchen  Calcium Carbonate-Vitamin D (CALCIUM + D PO), Take 600 mg by mouth 2 (two) times daily.  .  cholecalciferol (VITAMIN D) 1000 units tablet, Take 1,000 Units by mouth 2 (two) times daily.  .  diclofenac sodium (VOLTAREN) 1 % GEL, Apply 1 application topically as needed. Marland Kitchen  glucose blood (BAYER  CONTOUR TEST) test strip, Use as instructed to check sugar 6 times daily. Dx Code E10.65 .  mirabegron ER (MYRBETRIQ) 50 MG TB24 tablet, Take 1 tablet (50 mg total) by mouth daily. .  potassium chloride (K-DUR) 10 MEQ tablet, Take 1 tablet (10 mEq total) by mouth daily as needed (take with Lasix).    Past medical history, social, surgical and family history all reviewed in electronic medical record.  No pertanent information unless stated regarding to the chief complaint.   Review of Systems:  No headache, visual changes, nausea, vomiting, diarrhea, constipation, dizziness, abdominal pain, skin rash, fevers, chills, night sweats, weight loss, swollen lymph nodes, body aches, joint swelling, muscle aches, chest pain, shortness of breath, mood changes.   Objective  There were no vitals taken for this visit. Systems examined below as of    General: No apparent distress alert and oriented x3 mood and affect normal, dressed appropriately.  HEENT: Pupils equal, extraocular movements intact  Respiratory: Patient's speak in full sentences and does not appear short of breath  Cardiovascular: No lower extremity edema, non tender, no erythema  Skin: Warm dry intact with no signs of infection or rash on extremities or on axial skeleton.  Abdomen: Soft nontender  Neuro: Cranial nerves II through XII are intact, neurovascularly intact in all extremities with 2+ DTRs and 2+ pulses.  Lymph: No lymphadenopathy of posterior or anterior cervical chain or axillae bilaterally.  Gait normal with good balance and coordination.  MSK:  Non tender with full range of motion and good stability and symmetric strength and tone of shoulders, elbows, wrist, hip, knee and ankles bilaterally.     Impression and Recommendations:     This case required medical decision making of moderate complexity. The above documentation has been reviewed and is accurate and complete Lyndal Pulley, DO       Note: This dictation  was prepared with Dragon dictation along with smaller phrase technology. Any transcriptional errors that result from this process are unintentional.

## 2019-11-21 NOTE — Progress Notes (Signed)
[name] Payment Date of Birth  1946-07-22 [facility]          [id number] [address]    [place]     [place], [place]  [id number]           Problems. 1. CAD 2. Atrial Flutter - s/p Aflutter ablation Nov. 2014.  3. Diabetes Mellitus.      [name] is doing very well. She's not having episodes of chest pain or shortness of breath. She's been able to below of her normal activities without any significant problems. She has not had chest pains .  Her insurance will run out the month before she goes on Medicare.   She has not had any CP and no palpitations.    April 08, 2013:   [name] is doing well. No CP.  Rhythm is stable.     Mar 11, 2014:  [name] is doing well.  She had a atrial flutter ablation several months ago.  She has not been taking her metoprolol..  Nov. 9, 2015:  [name] is doing well. No Cp , no dyspnea.  Active, not exerciseing as much as she would like.  Mar 16, 2015:   [name] is doing well.   Doing well from a cardiac standpoint.   Nov. 10, 2016  Lots of stress - her 28 yo mom is living with her.  Will not let anyone else do anything Lots of stress No CP , no arrhythmias No time to exercise   Mar 20, 2016:  Doing well Her mother is living back in [name].   Stress is a bit less.  Exercising some .   Nov. 22, 2017.  Doing well . Had afib ablation several years ago and feels great.  DM is well controlled.  Has UTIs frequently .   November 17, 2016: Seen back today for follow-up of her coronary artery disease. Doing well Falls occasionally ,  Broke her left kneecap and left ankle this past year.  Takes the niacin at night and the ASA 81 mg does not conrol the niacin flushng like the 325 mg niacin does.   Jan. 14, 2020  [name] is seen today . No CP or dyspnea  Exercising some.   Diabetes is well controlled.   Jan. 18, 2021  [name] is seen toda for follow up of her  CAD, atrial flutter, DM, hyperlipidemia Doing well.    VS look  great Exercising regularly .  No CP or dyspnea.  Is on ASA 325 for the effects of niacin at bedtime     Current Outpatient Medications on File Prior to Visit  Medication Sig Dispense Refill  . aspirin 325 MG EC tablet Take 325 mg by mouth daily.    . Calcium Carbonate-Vitamin D (CALCIUM + D PO) Take 600 mg by mouth 2 (two) times daily.     . cholecalciferol (VITAMIN D) 1000 units tablet Take 1,000 Units by mouth 2 (two) times daily.     . clopidogrel (PLAVIX) 75 MG tablet TAKE 1 TABLET DAILY 90 tablet 3  . ezetimibe (ZETIA) 10 MG tablet TAKE 1 TABLET DAILY 90 tablet 3  . furosemide (LASIX) 20 MG tablet Take 20 mg by mouth. Take 1 tablet by mouth as needed for 5 days.    Marland Kitchen glucose blood (BAYER CONTOUR TEST) test strip Use as instructed to check sugar 6 times daily. Dx Code E10.65 550 each 3  . Insulin Aspart (INSULIN PUMP) 100 unit/ml SOLN Inject into  the skin. Novolog Vial, max 200 units per day with pump    . insulin lispro (HUMALOG) 100 UNIT/ML injection Inject into the skin. Max 200 units per day with pump     . losartan (COZAAR) 25 MG tablet TAKE 1 TABLET DAILY 90 tablet 2  . metFORMIN (GLUCOPHAGE-XR) 500 MG 24 hr tablet TAKE 1 TABLET IN THE MORNING AND 2 TABLETS IN THE EVENING AS DIRECTED 270 tablet 3  . metoprolol succinate (TOPROL-XL) 100 MG 24 hr tablet TAKE 1 TABLET DAILY 90 tablet 2  . niacin (NIASPAN) 1000 MG CR tablet Take 1 tablet (1,000 mg total) by mouth at bedtime. 90 tablet 1  . nitroGLYCERIN (NITROSTAT) 0.4 MG SL tablet Place 1 tablet (0.4 mg total) under the tongue every 5 (five) minutes as needed for chest pain. 75 tablet 2  . pioglitazone (ACTOS) 15 MG tablet TAKE 1 TABLET DAILY 90 tablet 3  . potassium chloride (K-DUR) 10 MEQ tablet Take 1 tablet (10 mEq total) by mouth daily as needed (take with Lasix). 90 tablet 3  . rosuvastatin (CRESTOR) 40 MG tablet TAKE 1 TABLET DAILY 90 tablet 3   No current facility-administered medications on file prior to visit.     Allergies  Allergen Reactions  . Sulfa Antibiotics Rash    Past Medical History:  Diagnosis Date  . Arthritis    OA  . Atrial flutter ([place])    a. s/p TEE/DCCV 08/16/13 (normal LV function, no LAA thrombus).b. s/p ablation by Dr Lovena Le 09-23-2013  . Coronary artery disease    2009 LAD Promus stent  . Dysrhythmia    Aflutter, no issues after ablation 2014  . Fibroid   . Hyperlipidemia   . Insulin pump in place   . Osteopenia   . Type 1 diabetes mellitus on insulin therapy ([place])   . Valvular heart disease    a. Mild MR/TR by TEE 08/2013.    Past Surgical History:  Procedure Laterality Date  . ABLATION  09-23-2013   CTI by Dr Lovena Le  . APPENDECTOMY    . APPLICATION OF WOUND VAC Left 04/11/2017   Procedure: APPLICATION OF WOUND VAC LEFT KNEE;  Surgeon: Rod Can, MD;  Location: [facility];  Service: Orthopedics;  Laterality: Left;  . ATRIAL FLUTTER ABLATION N/A 09/23/2013   Procedure: ATRIAL FLUTTER ABLATION;  Surgeon: Evans Lance, MD;  Location: [facility] CATH LAB;  Service: Cardiovascular;  Laterality: N/A;  . CARDIOVERSION N/A 08/16/2013   Procedure: CARDIOVERSION;  Surgeon: Thayer Headings, MD;  Location: [facility];  Service: Cardiovascular;  Laterality: N/A;  spoke with [name]  . CATARACT EXTRACTION Bilateral   . COLONOSCOPY    . CORONARY ANGIOPLASTY WITH STENT PLACEMENT    . MYOMECTOMY  1977  . ORIF PATELLA Left 04/11/2017  . ORIF PATELLA Left 04/11/2017   Procedure: OPEN REDUCTION INTERNAL (ORIF) FIXATION PATELLA LEFT KNEE;  Surgeon: Rod Can, MD;  Location: [facility];  Service: Orthopedics;  Laterality: Left;  . PELVIC LAPAROSCOPY    . TEE WITHOUT CARDIOVERSION N/A 08/16/2013   Procedure: TRANSESOPHAGEAL ECHOCARDIOGRAM (TEE);  Surgeon: Thayer Headings, MD;  [facility] ENDOSCOPY;  Service: Cardiovascular;  Laterality: N/A;    Social History   Tobacco Use  Smoking Status Never Smoker  Smokeless Tobacco Never Used    Social History   Substance and Sexual  Activity  Alcohol Use Yes  . Alcohol/week: 5.0 standard drinks  . Types: 5 Glasses of wine per week   Comment: on weekends  Family History  Problem Relation Age of Onset  . CAD Father 66       died age 51  . CAD Brother 38       small vessel disease  . Atrial fibrillation Mother   . Diabetes Mother   . Hypertension Mother   . CAD Cousin        paternal  . CAD Paternal Grandfather        early onset  . Colon cancer Neg Hx     Reviw of Systems:  Reviewed in the HPI.  All other systems are negative.   Physical Exam: Blood pressure 120/68, pulse 68, height 5\' 9"  (1.753 m), weight 218 lb (98.9 kg), SpO2 97 %.  GEN:  Elderly female,  NAD  HEENT: Normal NECK: No JVD; No carotid bruits LYMPHATICS: No lymphadenopathy CARDIAC: RRR , no murmurs, rubs, gallops RESPIRATORY:  Clear to auscultation without rales, wheezing or rhonchi  ABDOMEN: Soft, non-tender, non-distended MUSCULOSKELETAL:  No edema; No deformity  SKIN: Warm and dry NEUROLOGIC:  Alert and oriented x 3   ECG: Jan. 18, 2021:   NSR at 68.   No ST or T wave changes.   Assessment / Plan:   1. CAD -she denies having any episodes of angina.  She has been exercising on a regular basis.  2. Atrial Flutter -she status post flutter ablation.  No evidence of recurrent atrial flutter..  3. Diabetes Mellitus.  Hemoglobin A1c is 7.7.  I encouraged her to continue with a good carbohydrate restricted diet and to continue exercising.  Further management per Dr. [name].  4. Hperlipidemia: Continue current medications including Niaspan, Zetia and Crestor.  We will check lipids, liver enzymes, basic metabolic profile in 6 months when I see her again for an office visit.Marland Kitchen    Mertie Moores, MD  11/22/2019 8:35 AM    [address] [place],  [place], [place]  [address] Pager [phone] Phone: [phone]; Fax: [phone]

## 2019-11-22 ENCOUNTER — Encounter: Payer: Self-pay | Admitting: Cardiovascular Disease

## 2019-11-22 ENCOUNTER — Other Ambulatory Visit: Payer: Self-pay

## 2019-11-22 ENCOUNTER — Ambulatory Visit (INDEPENDENT_AMBULATORY_CARE_PROVIDER_SITE_OTHER): Payer: Medicare Other | Admitting: Cardiovascular Disease

## 2019-11-22 VITALS — BP 120/68 | HR 68 | Ht 69.0 in | Wt 218.0 lb

## 2019-11-22 DIAGNOSIS — I2510 Atherosclerotic heart disease of native coronary artery without angina pectoris: Secondary | ICD-10-CM | POA: Diagnosis not present

## 2019-11-22 DIAGNOSIS — I10 Essential (primary) hypertension: Secondary | ICD-10-CM

## 2019-11-22 DIAGNOSIS — E785 Hyperlipidemia, unspecified: Secondary | ICD-10-CM | POA: Diagnosis not present

## 2019-11-22 DIAGNOSIS — E1169 Type 2 diabetes mellitus with other specified complication: Secondary | ICD-10-CM

## 2019-11-22 DIAGNOSIS — I483 Typical atrial flutter: Secondary | ICD-10-CM | POA: Diagnosis not present

## 2019-11-22 LAB — CBC
Hematocrit: 43.9 % (ref 34.0–46.6)
Hemoglobin: 14.4 g/dL (ref 11.1–15.9)
MCH: 30.8 pg (ref 26.6–33.0)
MCHC: 32.8 g/dL (ref 31.5–35.7)
MCV: 94 fL (ref 79–97)
Platelets: 246 10*3/uL (ref 150–450)
RBC: 4.68 x10E6/uL (ref 3.77–5.28)
RDW: 13 % (ref 11.7–15.4)
WBC: 6.5 10*3/uL (ref 3.4–10.8)

## 2019-11-22 NOTE — Patient Instructions (Signed)
Medication Instructions:  Your physician recommends that you continue on your current medications as directed. Please refer to the Current Medication list given to you today.  *If you need a refill on your cardiac medications before your next appointment, please call your pharmacy*  Lab Work: [place]  Your physician recommends that you return for lab work in: 6 months on the day of or a few days before your office visit.  You will need to FAST for this appointment - nothing to eat or drink after midnight the night before except water.   If you have labs (blood work) drawn today and your tests are completely normal, you will receive your results only by: Marland Kitchen MyChart Message (if you have MyChart) OR . A paper copy in the mail If you have any lab test that is abnormal or we need to change your treatment, we will call you to review the results.   Testing/Procedures: None Ordered   Follow-Up: At [facility], you and your health needs are our priority.  As part of our continuing mission to provide you with exceptional heart care, we have created designated Provider Care Teams.  These Care Teams include your primary Cardiologist (physician) and Advanced Practice Providers (APPs -  Physician Assistants and Nurse Practitioners) who all work together to provide you with the care you need, when you need it.  Your next appointment:   6 month(s)  The format for your next appointment:   Either In Person or Virtual  Provider:   You may see Mertie Moores, MD or one of the following Advanced Practice Providers on your designated Care Team:    Richardson Dopp, PA-C  [place], [place]  Daune Perch, [place]

## 2020-01-11 ENCOUNTER — Other Ambulatory Visit: Payer: Self-pay

## 2020-01-11 ENCOUNTER — Other Ambulatory Visit (INDEPENDENT_AMBULATORY_CARE_PROVIDER_SITE_OTHER): Payer: Medicare Other

## 2020-01-11 DIAGNOSIS — E782 Mixed hyperlipidemia: Secondary | ICD-10-CM | POA: Diagnosis not present

## 2020-01-11 DIAGNOSIS — E1065 Type 1 diabetes mellitus with hyperglycemia: Secondary | ICD-10-CM | POA: Diagnosis not present

## 2020-01-11 LAB — LIPID PANEL
Cholesterol: 126 mg/dL (ref 0–200)
HDL: 47 mg/dL (ref >39.00)
LDL Cholesterol: 59 mg/dL (ref 0–99)
NonHDL: 78.71
Total CHOL/HDL Ratio: 3
Triglycerides: 100 mg/dL (ref 0.0–149.0)
VLDL: 20 mg/dL (ref 0.0–40.0)

## 2020-01-11 LAB — COMPREHENSIVE METABOLIC PANEL
ALT: 33 U/L (ref 0–35)
AST: 27 U/L (ref 0–37)
Albumin: 4 g/dL (ref 3.5–5.2)
Alkaline Phosphatase: 102 U/L (ref 39–117)
BUN: 12 mg/dL (ref 6–23)
CO2: 31 mEq/L (ref 19–32)
Calcium: 9.1 mg/dL (ref 8.4–10.5)
Chloride: 102 mEq/L (ref 96–112)
Creatinine, Ser: 0.78 mg/dL (ref 0.40–1.20)
GFR: 72.58 mL/min (ref >60.00)
Glucose, Bld: 107 mg/dL — ABNORMAL HIGH (ref 70–99)
Potassium: 4.4 mEq/L (ref 3.5–5.1)
Sodium: 136 mEq/L (ref 135–145)
Total Bilirubin: 0.4 mg/dL (ref 0.2–1.2)
Total Protein: 6.9 g/dL (ref 6.0–8.3)

## 2020-01-11 LAB — MICROALBUMIN / CREATININE URINE RATIO
Creatinine,U: 93.5 mg/dL
Microalb Creat Ratio: 0.7 mg/g (ref 0.0–30.0)
Microalb, Ur: <0.7 mg/dL (ref 0.0–1.9)

## 2020-01-11 LAB — HEMOGLOBIN A1C: Hgb A1c MFr Bld: 7.2 % — ABNORMAL HIGH (ref 4.6–6.5)

## 2020-01-13 NOTE — Progress Notes (Signed)
Patient ID: [name], female   DOB: [birth year], 74 y.o.   MRN: [id number]   Reason for visit: DIABETES followup.  Diagnosis: Type 1 diabetes mellitus, date of diagnosis: 1979.    HISTORY of present illness:   Insulin Pump: CURRENT brand: Medtronic 630  BASAL settings:  Midnight = 0.55, 3:30 AM = 1.2, 6 AM = 2.25, 10 AM = 0.75,  2 PM = 1.25, 5 PM = 2.1, 7:30 PM = 2.1, 8 PM = 3.2 and 10 PM = 1.9.    Total daily insulin = up to 60 units   Carbohydrate ratio 1: 12 breakfast, 1: 9 lunch and 1: 4.5 at dinner; sensitivity MN= 1:30; 1: 25 from 5 AM-7 AM, 7 AM-12 AM = 35; target 120-130  Insulin on board indicator on, duration 4 hrs   DIABETES: She has had long-standing diabetes with A1c levels usually above target. In 10/13 pioglitazone was restarted because of her large insulin requirement and diabetic dyslipidemia.  Her blood sugars improved significantly with this.  Also has been taking metformin as insulin sensitizer  RECENT history:   Her A1c is better at 7.2 compared to 7.7  Current diabetes management, blood sugar patterns from sensor and pump download:  Problems with management:  She is having a better time in range compared to last  Also hypoglycemia has been minimal  With current bolusing even though she is doing this on time her sugars are much higher after lunch,: Partly be related to not getting extra for higher fat items such as mayonnaise and cheese  Also not clear why she has unexpectedly hydrating during the night at times  She is fairly consistent with changing her infusion set on time and rotating the infusion set sites  No issues with her CGM  She is exercising fairly regularly and using exercise bike and going to the gym  However she is concerned about her difficulty losing weight and is up about 5 pounds since December   CONTINUOUS GLUCOSE MONITORING RECORD INTERPRETATION    Dates of Recording: 2/27 through 3/12  Sensor description:  G6  Results statistics:   CGM use % of time  99  Average and SD  160+/-55  Time in range  70      %  % Time Above 180  30  % Time above 250  7.7  % Time Below target  0.3    PRE-MEAL Fasting Lunch Dinner Bedtime Overall  Glucose range:       Mean/median:  150  175  151     POST-MEAL PC Breakfast PC Lunch PC Dinner  Glucose range:     Mean/median:  142  235  152    Glycemic patterns summary: Most significant abnormality is postprandial hyperglycemia after lunch averaging over 200 Otherwise average blood sugar is within target range She also has some days with hyperglycemia early morning and late evening Lowest blood sugars before dinnertime  Hyperglycemic episodes are primarily occurring after lunch with fairly consistently high readings sometimes over 300 Also may have inconsistent hyperglycemia peaking at 5 AM or 9-10 PM  Hypoglycemic episodes are minimal, only about 2 transiently around 11 PM-12 AM  Overnight periods: Blood sugars are quite variable with some good readings and a few days with significant hyperglycemia starting after 2 AM  Preprandial periods: Averages as above moderate variability  Postprandial periods:   After breakfast:   Blood sugars are fairly even compared to premeal blood sugars on an average After  lunch: Glucose is rising excessively almost all the time as above After dinner: Blood sugars are variable but on an average not rising  Previous data   CGM use % of time  91  2-week average/SD  161+/-55  Time in range      64%  % Time Above 180  34  % Time above 250  7  % Time Below 70  1.7     PRE-MEAL Fasting Lunch Dinner Bedtime Overall  Glucose range:       Averages:  170  166  130  64   POST-MEAL PC Breakfast PC Lunch PC Dinner  Glucose range:     Averages: ?  156  169    Hypoglycemic awareness: Has symptoms of feeling tired, headache, sweaty.  She thinks he can usually recognize symptoms  Sometimes may not be aware during the  night and will be notified by her sensor that the sugar is low  She has symptoms when blood glucose is less than 60. Uses glucose tablets for treatment even overnight     DIET has been usually fairly controlled with carbohydrate and fat intake, tends to eat out on weekends The mealtimes are variable.    Food preferences: Yogurt and cottage cheese for breakfast usually.   Blood sugars with exercise: No significant problems recently with adequate control and no hypoglycemia  .  Wt Readings from Last 3 Encounters:  01/14/20 221 lb (100.2 kg)  11/22/19 218 lb (98.9 kg)  10/11/19 216 lb 3.2 oz (98.1 kg)    LABS:  Lab Results  Component Value Date   HGBA1C 7.2 (H) 01/11/2020   HGBA1C 7.7 (H) 07/26/2019   HGBA1C 7.7 (H) 04/19/2019   Lab Results  Component Value Date   MICROALBUR <0.7 01/11/2020   LDLCALC 59 01/11/2020   CREATININE 0.78 01/11/2020    Lab on 01/11/2020  Component Date Value Ref Range Status  . Microalb, Ur 01/11/2020 <0.7  0.0 - 1.9 mg/dL Final  . Creatinine,U 01/11/2020 93.5  mg/dL Final  . Microalb Creat Ratio 01/11/2020 0.7  0.0 - 30.0 mg/g Final  . Cholesterol 01/11/2020 126  0 - 200 mg/dL Final   ATP III Classification       Desirable:  < 200 mg/dL               Borderline High:  200 - 239 mg/dL          High:  > = 240 mg/dL  . Triglycerides 01/11/2020 100.0  0.0 - 149.0 mg/dL Final   Normal:  <150 mg/dLBorderline High:  150 - 199 mg/dL  . HDL 01/11/2020 47.00  >39.00 mg/dL Final  . VLDL 01/11/2020 20.0  0.0 - 40.0 mg/dL Final  . LDL Cholesterol 01/11/2020 59  0 - 99 mg/dL Final  . Total CHOL/HDL Ratio 01/11/2020 3   Final                  Men          Women1/2 Average Risk     3.4          3.3Average Risk          5.0          4.42X Average Risk          9.6          7.13X Average Risk          15.0          11.0                      .  NonHDL 01/11/2020 78.71   Final   NOTE:  Non-HDL goal should be 30 mg/dL higher than patient's LDL goal (i.e. LDL goal  of < 70 mg/dL, would have non-HDL goal of < 100 mg/dL)  . Sodium 01/11/2020 136  135 - 145 mEq/L Final  . Potassium 01/11/2020 4.4  3.5 - 5.1 mEq/L Final  . Chloride 01/11/2020 102  96 - 112 mEq/L Final  . CO2 01/11/2020 31  19 - 32 mEq/L Final  . Glucose, Bld 01/11/2020 107* 70 - 99 mg/dL Final  . BUN 01/11/2020 12  6 - 23 mg/dL Final  . Creatinine, Ser 01/11/2020 0.78  0.40 - 1.20 mg/dL Final  . Total Bilirubin 01/11/2020 0.4  0.2 - 1.2 mg/dL Final  . Alkaline Phosphatase 01/11/2020 102  39 - 117 U/L Final  . AST 01/11/2020 27  0 - 37 U/L Final  . ALT 01/11/2020 33  0 - 35 U/L Final  . Total Protein 01/11/2020 6.9  6.0 - 8.3 g/dL Final  . Albumin 01/11/2020 4.0  3.5 - 5.2 g/dL Final  . GFR 01/11/2020 72.58  >60.00 mL/min Final  . Calcium 01/11/2020 9.1  8.4 - 10.5 mg/dL Final  . Hgb A1c MFr Bld 01/11/2020 7.2* 4.6 - 6.5 % Final   Glycemic Control Guidelines for People with Diabetes:Non Diabetic:  <6%Goal of Therapy: <7%Additional Action Suggested:  >8%      Allergies as of 01/14/2020      Reactions   Sulfa Antibiotics Rash      Medication List       Accurate as of January 14, 2020 10:03 AM. If you have any questions, ask your nurse or doctor.        aspirin 325 MG EC tablet Take 325 mg by mouth daily.   CALCIUM + D PO Take 600 mg by mouth 2 (two) times daily.   cholecalciferol 1000 units tablet Commonly known as: VITAMIN D Take 1,000 Units by mouth 2 (two) times daily.   clopidogrel 75 MG tablet Commonly known as: PLAVIX TAKE 1 TABLET DAILY   ezetimibe 10 MG tablet Commonly known as: ZETIA TAKE 1 TABLET DAILY   furosemide 20 MG tablet Commonly known as: LASIX Take 20 mg by mouth. Take 1 tablet by mouth as needed for 5 days.   glucose blood test strip Commonly known as: Estate manager/land agent Use as instructed to check sugar 6 times daily. Dx Code E10.65   HumaLOG 100 UNIT/ML injection Generic drug: insulin lispro Inject into the skin. Max 200 units per day with  pump   insulin pump Soln Inject into the skin. Novolog Vial, max 200 units per day with pump   losartan 25 MG tablet Commonly known as: COZAAR TAKE 1 TABLET DAILY   metFORMIN 500 MG 24 hr tablet Commonly known as: GLUCOPHAGE-XR TAKE 1 TABLET IN THE MORNING AND 2 TABLETS IN THE EVENING AS DIRECTED   metoprolol succinate 100 MG 24 hr tablet Commonly known as: TOPROL-XL TAKE 1 TABLET DAILY   niacin 1000 MG CR tablet Commonly known as: NIASPAN Take 1 tablet (1,000 mg total) by mouth at bedtime.   nitroGLYCERIN 0.4 MG SL tablet Commonly known as: Nitrostat Place 1 tablet (0.4 mg total) under the tongue every 5 (five) minutes as needed for chest pain.   pioglitazone 15 MG tablet Commonly known as: ACTOS TAKE 1 TABLET DAILY   potassium chloride 10 MEQ tablet Commonly known as: KLOR-CON Take 1 tablet (10 mEq total) by mouth daily as needed (  take with Lasix).   rosuvastatin 40 MG tablet Commonly known as: CRESTOR TAKE 1 TABLET DAILY       Allergies:  Allergies  Allergen Reactions  . Sulfa Antibiotics Rash    Past Medical History:  Diagnosis Date  . Arthritis    OA  . Atrial flutter ([place])    a. s/p TEE/DCCV 08/16/13 (normal LV function, no LAA thrombus).b. s/p ablation by Dr Lovena Le 09-23-2013  . Coronary artery disease    2009 LAD Promus stent  . Dysrhythmia    Aflutter, no issues after ablation 2014  . Fibroid   . Hyperlipidemia   . Insulin pump in place   . Osteopenia   . Type 1 diabetes mellitus on insulin therapy ([place])   . Valvular heart disease    a. Mild MR/TR by TEE 08/2013.    Past Surgical History:  Procedure Laterality Date  . ABLATION  09-23-2013   CTI by Dr Lovena Le  . APPENDECTOMY    . APPLICATION OF WOUND VAC Left 04/11/2017   Procedure: APPLICATION OF WOUND VAC LEFT KNEE;  Surgeon: Rod Can, MD;  Location: [facility];  Service: Orthopedics;  Laterality: Left;  . ATRIAL FLUTTER ABLATION N/A 09/23/2013   Procedure: ATRIAL FLUTTER ABLATION;   Surgeon: Evans Lance, MD;  Location: [facility] CATH LAB;  Service: Cardiovascular;  Laterality: N/A;  . CARDIOVERSION N/A 08/16/2013   Procedure: CARDIOVERSION;  Surgeon: Thayer Headings, MD;  Location: [facility];  Service: Cardiovascular;  Laterality: N/A;  spoke with [name]  . CATARACT EXTRACTION Bilateral   . COLONOSCOPY    . CORONARY ANGIOPLASTY WITH STENT PLACEMENT    . MYOMECTOMY  1977  . ORIF PATELLA Left 04/11/2017  . ORIF PATELLA Left 04/11/2017   Procedure: OPEN REDUCTION INTERNAL (ORIF) FIXATION PATELLA LEFT KNEE;  Surgeon: Rod Can, MD;  Location: [facility];  Service: Orthopedics;  Laterality: Left;  . PELVIC LAPAROSCOPY    . TEE WITHOUT CARDIOVERSION N/A 08/16/2013   Procedure: TRANSESOPHAGEAL ECHOCARDIOGRAM (TEE);  Surgeon: Thayer Headings, MD;  Location: [facility] ENDOSCOPY;  Service: Cardiovascular;  Laterality: N/A;    Family History  Problem Relation Age of Onset  . CAD Father 35       died age 5  . CAD Brother 36       small vessel disease  . Atrial fibrillation Mother   . Diabetes Mother   . Hypertension Mother   . CAD Cousin        paternal  . CAD Paternal Grandfather        early onset  . Colon cancer Neg Hx     Social History:  reports that she has never smoked. She has never used smokeless tobacco. She reports current alcohol use of about 5.0 standard drinks of alcohol per week. She reports that she does not use drugs.  REVIEW of systems:   Cardiac function: Her echo shows only grade 1 diastolic dysfunction and normal systolic function  She has had atrial arrhythmias treated with metoprolol  Blood pressure usually normal, on 25 mg losartan from cardiologist for preventive reasons  BP Readings from Last 3 Encounters:  01/14/20 122/74  11/22/19 120/68  10/11/19 136/80     She has had diabetic dyslipidemia with significantly high LDL particle number and this has been well controlled with Crestor 40 mg and Niaspan 1000 mg Her last LDL particle number was  down to below 900  She is taking her niacin regularly with her Crestor This is controlling HDL and  improved particle number  Zetia added in 3/19 Now her LDL is below 70 consistently although minimally higher than before   Lab Results  Component Value Date   CHOL 126 01/11/2020   HDL 47.00 01/11/2020   LDLCALC 59 01/11/2020   LDLDIRECT 79.0 01/11/2015   TRIG 100.0 01/11/2020   CHOLHDL 3 01/11/2020     No history of thyroid disease  Lab Results  Component Value Date   TSH 2.23 10/06/2019   TSH 1.86 01/19/2019   TSH 1.684 08/15/2013     Has a history of retinopathy and will need report from ophthalmology on her upcoming visits   BP 122/74 (BP Location: Left Arm, Patient Position: Sitting, Cuff Size: Normal)   Pulse 70   Ht 5\' 9"  (1.753 m)   Wt 221 lb (100.2 kg)   SpO2 (!) 79%   BMI 32.64 kg/m    ASSESSMENT/PLAN:  DIABETES type 1 with inadequate control on Medtronic insulin pump  Her A1c is 7.2   See history of present illness for detailed discussion of her current blood sugar patterns and management with her pump  She has relatively more stable blood sugars but still some variability present However she appears to be needing much more bolus coverage at lunch with significant postprandial peaks This is despite her bolusing a few minutes before starting to eat  She may be able to benefit from Lyumjev compared to Humalog and given her a sample to try it If this is beneficial for postprandial hyperglycemia we can switch, may also need to consider shortening active insulin time with this Hypoglycemia has been minimal otherwise  Discussed possibly using the T-insulin pump in 5/21 when she is eligible for upgrade unless Medtronic get approval for 770  Recommendations: Decreased basal rate at 10 PM down to 1.8  Carbohydrate ratio will be 1: 7 at lunch instead of 9 If this is not enough she can change to 1: 6 Also will add 5 to 10 g to account for higher fat  content of mayonnaise and cheese for her sandwiches  Reminded her to use temporary basal when blood sugars are near normal before exercise and trending lower  WEIGHT gain: This is unexplained and we can have her see the dietitian but however in the meantime we will try to have her review of [place]os for 3 months when his blood sugars go up significantly  Lipids: She will stay on same regimen as LDL is well below 70 and HDL normal  Follow-up in 3 months   Patient Instructions  Carb ratio 7 instead of 9    [name] 01/14/20   Note: This office note was prepared with Dragon voice recognition system technology. Any transcriptional errors that result from this process are unintentional.

## 2020-01-14 ENCOUNTER — Ambulatory Visit (INDEPENDENT_AMBULATORY_CARE_PROVIDER_SITE_OTHER): Payer: Medicare Other | Admitting: Endocrinology

## 2020-01-14 ENCOUNTER — Other Ambulatory Visit: Payer: Self-pay

## 2020-01-14 ENCOUNTER — Encounter: Payer: Self-pay | Admitting: Endocrinology

## 2020-01-14 VITALS — BP 122/74 | HR 70 | Ht 69.0 in | Wt 221.0 lb

## 2020-01-14 DIAGNOSIS — I2510 Atherosclerotic heart disease of native coronary artery without angina pectoris: Secondary | ICD-10-CM

## 2020-01-14 DIAGNOSIS — E782 Mixed hyperlipidemia: Secondary | ICD-10-CM

## 2020-01-14 DIAGNOSIS — E1065 Type 1 diabetes mellitus with hyperglycemia: Secondary | ICD-10-CM

## 2020-01-14 NOTE — Progress Notes (Signed)
Per instructions of Dr. [name], pt was given one vial of Lyumjev U100 insulin as a sample.  [id number] F EX:02/20/2021.

## 2020-01-14 NOTE — Patient Instructions (Signed)
Carb ratio 7 instead of 9

## 2020-01-20 ENCOUNTER — Other Ambulatory Visit: Payer: Self-pay

## 2020-01-20 ENCOUNTER — Encounter: Payer: Self-pay | Admitting: Internal Medicine

## 2020-01-20 ENCOUNTER — Ambulatory Visit (INDEPENDENT_AMBULATORY_CARE_PROVIDER_SITE_OTHER): Payer: Medicare Other | Admitting: Internal Medicine

## 2020-01-20 VITALS — BP 136/84 | HR 73 | Temp 97.9°F | Ht 69.0 in | Wt 223.4 lb

## 2020-01-20 DIAGNOSIS — E1169 Type 2 diabetes mellitus with other specified complication: Secondary | ICD-10-CM

## 2020-01-20 DIAGNOSIS — Z0000 Encounter for general adult medical examination without abnormal findings: Secondary | ICD-10-CM

## 2020-01-20 DIAGNOSIS — M8949 Other hypertrophic osteoarthropathy, multiple sites: Secondary | ICD-10-CM

## 2020-01-20 DIAGNOSIS — M159 Polyosteoarthritis, unspecified: Secondary | ICD-10-CM

## 2020-01-20 DIAGNOSIS — E785 Hyperlipidemia, unspecified: Secondary | ICD-10-CM | POA: Diagnosis not present

## 2020-01-20 DIAGNOSIS — E1065 Type 1 diabetes mellitus with hyperglycemia: Secondary | ICD-10-CM

## 2020-01-20 DIAGNOSIS — I2510 Atherosclerotic heart disease of native coronary artery without angina pectoris: Secondary | ICD-10-CM | POA: Diagnosis not present

## 2020-01-20 DIAGNOSIS — I10 Essential (primary) hypertension: Secondary | ICD-10-CM | POA: Diagnosis not present

## 2020-01-20 NOTE — Assessment & Plan Note (Signed)
Failed voltaren gel. Given recent microalbumin to creatinine ratio and renal function normal okay with advil daily prn. This is giving her good control. Advised she could pursue injections in thumbs if worsening in future.

## 2020-01-20 NOTE — Progress Notes (Signed)
Subjective:   Patient ID: [name], female    DOB: 1946/09/29, 74 y.o.   MRN: [id number]  HPI Here for medicare wellness, no new complaints. Please see A/P for status and treatment of chronic medical problems.   HPI #2: Here for follow up arthritis (mostly in the hands and thumbs in particular, does a lot with her hands, using advil daily prn, denies GERD or dark stools, works well, has also tried tylenol and otc creams which did not help, voltaren gel did not help) and diabetes (seeing endo and still taking insulin pump and metformin, taking statin and ARB, got sample of new insulin recently and this is causing her to have more fast lowering of sugars after meals and this caused her to have some low sugars recently) and cholesterol (taking crestor daily, denies side effects, denies chest pains or stroke symptoms).   Diet: DM since diabetic Physical activity: sedentary Depression/mood screen: negative Hearing: intact to whispered voice Visual acuity: grossly normal, performs annual eye exam  ADLs: capable Fall risk: none Home safety: good Cognitive evaluation: intact to orientation, naming, recall and repetition EOL planning: adv directives discussed    Office Visit from 01/20/2020 in [place] at [facility] Total Score  1      I have personally reviewed and have noted 1. The patient's medical and social history - reviewed today no changes 2. Their use of alcohol, tobacco or illicit drugs 3. Their current medications and supplements 4. The patient's functional ability including ADL's, fall risks, home safety risks and hearing or visual impairment. 5. Diet and physical activities 6. Evidence for depression or mood disorders 7. Care team reviewed and updated  Patient Care Team: Hoyt Koch, MD as PCP - General (Internal Medicine) [name], Wonda Cheng, MD as PCP - Cardiology (Cardiology) Elayne Snare, MD (Endocrinology) [name], Wonda Cheng, MD  (Cardiology) Lindwood Coke, MD (Dermatology) Evans Lance, MD (Cardiology) Lafayette Dragon, MD (Inactive) (Gastroenterology) Dyke Maes, [place] (Optometry) Lyndal Pulley, DO (Sports Medicine) Past Medical History:  Diagnosis Date  . Arthritis    OA  . Atrial flutter ([place])    a. s/p TEE/DCCV 08/16/13 (normal LV function, no LAA thrombus).b. s/p ablation by Dr Lovena Le 09-23-2013  . Coronary artery disease    2009 LAD Promus stent  . Dysrhythmia    Aflutter, no issues after ablation 2014  . Fibroid   . Hyperlipidemia   . Insulin pump in place   . Osteopenia   . Type 1 diabetes mellitus on insulin therapy ([place])   . Valvular heart disease    a. Mild MR/TR by TEE 08/2013.   Past Surgical History:  Procedure Laterality Date  . ABLATION  09-23-2013   CTI by Dr Lovena Le  . APPENDECTOMY    . APPLICATION OF WOUND VAC Left 04/11/2017   Procedure: APPLICATION OF WOUND VAC LEFT KNEE;  Surgeon: Rod Can, MD;  Location: [place];  Service: Orthopedics;  Laterality: Left;  . ATRIAL FLUTTER ABLATION N/A 09/23/2013   Procedure: ATRIAL FLUTTER ABLATION;  Surgeon: Evans Lance, MD;  Location: [facility] CATH LAB;  Service: Cardiovascular;  Laterality: N/A;  . CARDIOVERSION N/A 08/16/2013   Procedure: CARDIOVERSION;  Surgeon: Thayer Headings, MD;  Location: [place];  Service: Cardiovascular;  Laterality: N/A;  spoke with [name]  . CATARACT EXTRACTION Bilateral   . COLONOSCOPY    . CORONARY ANGIOPLASTY WITH STENT PLACEMENT    . MYOMECTOMY  1977  . ORIF PATELLA Left  04/11/2017  . ORIF PATELLA Left 04/11/2017   Procedure: OPEN REDUCTION INTERNAL (ORIF) FIXATION PATELLA LEFT KNEE;  Surgeon: Rod Can, MD;  Location: [place];  Service: Orthopedics;  Laterality: Left;  . PELVIC LAPAROSCOPY    . TEE WITHOUT CARDIOVERSION N/A 08/16/2013   Procedure: TRANSESOPHAGEAL ECHOCARDIOGRAM (TEE);  Surgeon: Thayer Headings, MD;  Location: [facility] ENDOSCOPY;  Service: Cardiovascular;  Laterality: N/A;   Family  History  Problem Relation Age of Onset  . CAD Father 32       died age 69  . CAD Brother 65       small vessel disease  . Atrial fibrillation Mother   . Diabetes Mother   . Hypertension Mother   . CAD Cousin        paternal  . CAD Paternal Grandfather        early onset  . Colon cancer Neg Hx     Review of Systems  Constitutional: Negative.   HENT: Negative.   Eyes: Negative.   Respiratory: Negative for cough, chest tightness and shortness of breath.   Cardiovascular: Negative for chest pain, palpitations and leg swelling.  Gastrointestinal: Negative for abdominal distention, abdominal pain, constipation, diarrhea, nausea and vomiting.  Musculoskeletal: Positive for arthralgias and myalgias.  Skin: Negative.   Neurological: Negative.   Psychiatric/Behavioral: Negative.     Objective:  Physical Exam Constitutional:      Appearance: She is well-developed.  HENT:     Head: Normocephalic and atraumatic.  Cardiovascular:     Rate and Rhythm: Normal rate and regular rhythm.  Pulmonary:     Effort: Pulmonary effort is normal. No respiratory distress.     Breath sounds: Normal breath sounds. No wheezing or rales.  Abdominal:     General: Bowel sounds are normal. There is no distension.     Palpations: Abdomen is soft.     Tenderness: There is no abdominal tenderness. There is no rebound.  Musculoskeletal:     Cervical back: Normal range of motion.  Skin:    General: Skin is warm and dry.     Comments: Foot exam done  Neurological:     Mental Status: She is alert and oriented to person, place, and time.     Coordination: Coordination normal.     Vitals:   01/20/20 1014  BP: 136/84  Pulse: 73  Temp: 97.9 F (36.6 C)  TempSrc: Oral  SpO2: 96%  Weight: 223 lb 6.4 oz (101.3 kg)  Height: 5\' 9"  (1.753 m)    This visit occurred during the SARS-CoV-2 public health emergency.  Safety protocols were in place, including screening questions prior to the visit, additional  usage of staff PPE, and extensive cleaning of exam room while observing appropriate contact time as indicated for disinfecting solutions.   Assessment & Plan:

## 2020-01-20 NOTE — Assessment & Plan Note (Signed)
Foot exam done and HgA1c last 7.2. She is having some low sugars with new insulin sample and she is asked to reach out to her endocrinologist.

## 2020-01-20 NOTE — Patient Instructions (Signed)
Health Maintenance, Female Adopting a healthy lifestyle and getting preventive care are important in promoting health and wellness. Ask your health care provider about:  The right schedule for you to have regular tests and exams.  Things you can do on your own to prevent diseases and keep yourself healthy. What should I know about diet, weight, and exercise? Eat a healthy diet   Eat a diet that includes plenty of vegetables, fruits, low-fat dairy products, and lean protein.  Do not eat a lot of foods that are high in solid fats, added sugars, or sodium. Maintain a healthy weight Body mass index (BMI) is used to identify weight problems. It estimates body fat based on height and weight. Your health care provider can help determine your BMI and help you achieve or maintain a healthy weight. Get regular exercise Get regular exercise. This is one of the most important things you can do for your health. Most adults should:  Exercise for at least 150 minutes each week. The exercise should increase your heart rate and make you sweat (moderate-intensity exercise).  Do strengthening exercises at least twice a week. This is in addition to the moderate-intensity exercise.  Spend less time sitting. Even light physical activity can be beneficial. Watch cholesterol and blood lipids Have your blood tested for lipids and cholesterol at 72 years of age, then have this test every 5 years. Have your cholesterol levels checked more often if:  Your lipid or cholesterol levels are high.  You are older than 72 years of age.  You are at high risk for heart disease. What should I know about cancer screening? Depending on your health history and family history, you may need to have cancer screening at various ages. This may include screening for:  Breast cancer.  Cervical cancer.  Colorectal cancer.  Skin cancer.  Lung cancer. What should I know about heart disease, diabetes, and high blood  pressure? Blood pressure and heart disease  High blood pressure causes heart disease and increases the risk of stroke. This is more likely to develop in people who have high blood pressure readings, are of African descent, or are overweight.  Have your blood pressure checked: ? Every 3-5 years if you are 18-39 years of age. ? Every year if you are 40 years old or older. Diabetes Have regular diabetes screenings. This checks your fasting blood sugar level. Have the screening done:  Once every three years after age 40 if you are at a normal weight and have a low risk for diabetes.  More often and at a younger age if you are overweight or have a high risk for diabetes. What should I know about preventing infection? Hepatitis B If you have a higher risk for hepatitis B, you should be screened for this virus. Talk with your health care provider to find out if you are at risk for hepatitis B infection. Hepatitis C Testing is recommended for:  Everyone born from 1945 through 1965.  Anyone with known risk factors for hepatitis C. Sexually transmitted infections (STIs)  Get screened for STIs, including gonorrhea and chlamydia, if: ? You are sexually active and are younger than 72 years of age. ? You are older than 72 years of age and your health care provider tells you that you are at risk for this type of infection. ? Your sexual activity has changed since you were last screened, and you are at increased risk for chlamydia or gonorrhea. Ask your health care provider if   you are at risk.  Ask your health care provider about whether you are at high risk for HIV. Your health care provider may recommend a prescription medicine to help prevent HIV infection. If you choose to take medicine to prevent HIV, you should first get tested for HIV. You should then be tested every 3 months for as long as you are taking the medicine. Pregnancy  If you are about to stop having your period (premenopausal) and  you may become pregnant, seek counseling before you get pregnant.  Take 400 to 800 micrograms (mcg) of folic acid every day if you become pregnant.  Ask for birth control (contraception) if you want to prevent pregnancy. Osteoporosis and menopause Osteoporosis is a disease in which the bones lose minerals and strength with aging. This can result in bone fractures. If you are 65 years old or older, or if you are at risk for osteoporosis and fractures, ask your health care provider if you should:  Be screened for bone loss.  Take a calcium or vitamin D supplement to lower your risk of fractures.  Be given hormone replacement therapy (HRT) to treat symptoms of menopause. Follow these instructions at home: Lifestyle  Do not use any products that contain nicotine or tobacco, such as cigarettes, e-cigarettes, and chewing tobacco. If you need help quitting, ask your health care provider.  Do not use street drugs.  Do not share needles.  Ask your health care provider for help if you need support or information about quitting drugs. Alcohol use  Do not drink alcohol if: ? Your health care provider tells you not to drink. ? You are pregnant, may be pregnant, or are planning to become pregnant.  If you drink alcohol: ? Limit how much you use to 0-1 drink a day. ? Limit intake if you are breastfeeding.  Be aware of how much alcohol is in your drink. In the [place] one drink equals one 12 oz bottle of beer (355 mL), one 5 oz glass of wine (148 mL), or one 1 oz glass of hard liquor (44 mL). General instructions  Schedule regular health, dental, and eye exams.  Stay current with your vaccines.  Tell your health care provider if: ? You often feel depressed. ? You have ever been abused or do not feel safe at home. Summary  Adopting a healthy lifestyle and getting preventive care are important in promoting health and wellness.  Follow your health care provider's instructions about healthy  diet, exercising, and getting tested or screened for diseases.  Follow your health care provider's instructions on monitoring your cholesterol and blood pressure. This information is not intended to replace advice given to you by your health care provider. Make sure you discuss any questions you have with your health care provider. Document Revised: 10/14/2018 Document Reviewed: 10/14/2018 Elsevier Patient Education  2020 [employer]  

## 2020-01-20 NOTE — Assessment & Plan Note (Signed)
Checking lipid panel and adjust crestor as needed.

## 2020-01-20 NOTE — Assessment & Plan Note (Signed)
BP at goal on lasix as needed and losartan and metoprolol. Recent BMP without indication for change.

## 2020-01-20 NOTE — Assessment & Plan Note (Signed)
Flu shot up to date. Pneumonia complete. Shingrix counseled. Tetanus due 2023. Colonoscopy due 2025. Mammogram due 2022, pap smear aged out and dexa due 2022. Counseled about sun safety and mole surveillance. Counseled about the dangers of distracted driving. Given 10 year screening recommendations.

## 2020-03-02 ENCOUNTER — Other Ambulatory Visit: Payer: Self-pay

## 2020-03-02 MED ORDER — LYUMJEV 100 UNIT/ML IJ SOLN: 200.0000 [IU] | Freq: Every day | INTRAMUSCULAR | 0 refills | Status: DC

## 2020-03-31 ENCOUNTER — Other Ambulatory Visit: Payer: Self-pay | Admitting: Endocrinology

## 2020-04-18 ENCOUNTER — Other Ambulatory Visit: Payer: Self-pay

## 2020-04-18 ENCOUNTER — Other Ambulatory Visit (INDEPENDENT_AMBULATORY_CARE_PROVIDER_SITE_OTHER): Payer: Medicare Other

## 2020-04-18 DIAGNOSIS — E1065 Type 1 diabetes mellitus with hyperglycemia: Secondary | ICD-10-CM

## 2020-04-18 LAB — BASIC METABOLIC PANEL
BUN: 17 mg/dL (ref 6–23)
CO2: 29 mEq/L (ref 19–32)
Calcium: 8.9 mg/dL (ref 8.4–10.5)
Chloride: 106 mEq/L (ref 96–112)
Creatinine, Ser: 0.71 mg/dL (ref 0.40–1.20)
GFR: 80.84 mL/min (ref >60.00)
Glucose, Bld: 164 mg/dL — ABNORMAL HIGH (ref 70–99)
Potassium: 4.5 mEq/L (ref 3.5–5.1)
Sodium: 136 mEq/L (ref 135–145)

## 2020-04-18 LAB — HEMOGLOBIN A1C: Hgb A1c MFr Bld: 8.3 % — ABNORMAL HIGH (ref 4.6–6.5)

## 2020-04-18 LAB — MICROALBUMIN / CREATININE URINE RATIO
Creatinine,U: 46.8 mg/dL
Microalb Creat Ratio: 1.5 mg/g (ref 0.0–30.0)
Microalb, Ur: <0.7 mg/dL (ref 0.0–1.9)

## 2020-04-19 NOTE — Progress Notes (Signed)
Patient ID: [name], female   DOB: 1946/08/14, 74 y.o.   MRN: [id number]   Reason for visit: DIABETES followup.  Diagnosis: Type 1 diabetes mellitus, date of diagnosis: 1979.    HISTORY of present illness:   Insulin Pump: CURRENT brand: Medtronic 630  BASAL settings:  Midnight = 0.55, 3:30 AM = 1.2, 6 AM = 2.25, 10 AM = 0.75,  2 PM = 1.25, 5 PM = 2.1, 7:30 PM = 2.1, 8 PM = 3.2 and 10 PM = 1.9.    Total daily insulin = up to 60 units   Carbohydrate ratio 1: 12 breakfast, 1: 9 lunch and 1: 4.5 at dinner; sensitivity MN= 1:30; 1: 25 from 5 AM-7 AM, 7 AM-12 AM = 35; target 120-130  Insulin on board indicator on, duration 4 hrs   DIABETES: She has had long-standing diabetes with A1c levels usually above target. In 10/13 pioglitazone was restarted because of her large insulin requirement and diabetic dyslipidemia.  Her blood sugars improved significantly with this.  Also has been taking metformin as insulin sensitizer  RECENT history:   Her A1c is higher at 8.3, was at 7.2    Current diabetes management, blood sugar patterns from sensor and pump download:  Problems with management:  Even with increasing her carbohydrate coverage at lunchtime her blood sugars are the highest postprandially  She was given a sample of Lyumjev and she thinks it worked faster and blood sugar may have been better but she did not ask for prescription  Still has some variability in her blood sugars overnight and after evening meals but not clear why her A1c is higher  She was also told to leave off Actos because of weight gain, although she did not think it had caused blood sugars to be higher she is back on it for about 2 months again for now  She is doing only a little walking and no formal exercise recently because of being occupied with other activities  Surprisingly her weight is down 7 pounds  Occasionally may suspend her pump for her shower but may forget to resume right  away  She is asking about getting a new pump as she may be eligible for it now   CONTINUOUS GLUCOSE MONITORING RECORD INTERPRETATION    Dates of Recording: Last 2 weeks  Sensor description: G6  Results statistics:   CGM use % of time  98  Average and SD  162+/-55  Time in range     60   %, was 70  % Time Above 180  37  % Time above 250  5  % Time Below target  2.7    Glycemic patterns summary: She has overall significant hyperglycemia except midmorning and early evenings. Hyperglycemia is more consistent in the mid afternoon between about 2-5 PM but is variably high also late evening and overnight  Hyperglycemic episodes are occurring at times overnight especially in week #1, fairly consistently after lunch and occasionally after dinner  Hypoglycemic episodes occurred rarely and transiently around midnight  Overnight periods: Blood sugars are quite variable with higher readings especially between 2-6 AM in week 1 and in week 2 blood sugars are more than in the target range  Preprandial periods: Blood sugars at breakfast are usually near normal, boluses are mostly around 9-10 AM in the morning Lunch boluses are usually in the early afternoon with usually glucose in the low 100 range Average blood sugar, 6-7 PM at dinnertime is about  129  Postprandial periods:   After breakfast: On average blood sugars are within target range after eating After lunch:   Blood sugars are very consistently going up although to a different extent, highest reading over 300 After dinner: Blood sugars are quite variable as discussed above and are rising somewhat excessively only on 3 occasions    Previous data:   CGM use % of time  99  Average and SD  160+/-55  Time in range  70      %  % Time Above 180  30  % Time above 250  7.7  % Time Below target  0.3    PRE-MEAL Fasting Lunch Dinner Bedtime Overall  Glucose range:       Mean/median:  150  175  151     POST-MEAL PC Breakfast PC  Lunch PC Dinner  Glucose range:     Mean/median:  142  235  152    Hypoglycemic awareness: Has symptoms of feeling tired, headache, sweaty.  She thinks he can usually recognize symptoms  Sometimes may not be aware during the night and will be notified by her sensor that the sugar is low  She has symptoms when blood glucose is less than 60. Uses glucose tablets for treatment even overnight     DIET has been usually fairly controlled with carbohydrate and fat intake, tends to eat out on weekends The mealtimes are variable.    Food preferences: Yogurt and cottage cheese for breakfast usually.   Blood sugars with exercise: No significant problems recently with adequate control and no hypoglycemia  .  Wt Readings from Last 3 Encounters:  04/20/20 216 lb 3.2 oz (98.1 kg)  01/20/20 223 lb 6.4 oz (101.3 kg)  01/14/20 221 lb (100.2 kg)    LABS:  Lab Results  Component Value Date   HGBA1C 8.3 (H) 04/18/2020   HGBA1C 7.2 (H) 01/11/2020   HGBA1C 7.7 (H) 07/26/2019   Lab Results  Component Value Date   MICROALBUR <0.7 04/18/2020   LDLCALC 59 01/11/2020   CREATININE 0.71 04/18/2020    Lab on 04/18/2020  Component Date Value Ref Range Status   Microalb, Ur 04/18/2020 <0.7  0.0 - 1.9 mg/dL Final   Creatinine,U 04/18/2020 46.8  mg/dL Final   Microalb Creat Ratio 04/18/2020 1.5  0.0 - 30.0 mg/g Final   Sodium 04/18/2020 136  135 - 145 mEq/L Final   Potassium 04/18/2020 4.5  3.5 - 5.1 mEq/L Final   Chloride 04/18/2020 106  96 - 112 mEq/L Final   CO2 04/18/2020 29  19 - 32 mEq/L Final   Glucose, Bld 04/18/2020 164* 70 - 99 mg/dL Final   BUN 04/18/2020 17  6 - 23 mg/dL Final   Creatinine, Ser 04/18/2020 0.71  0.40 - 1.20 mg/dL Final   GFR 04/18/2020 80.84  >60.00 mL/min Final   Calcium 04/18/2020 8.9  8.4 - 10.5 mg/dL Final   Hgb A1c MFr Bld 04/18/2020 8.3* 4.6 - 6.5 % Final   Glycemic Control Guidelines for People with Diabetes:Non Diabetic:  <6%Goal of Therapy:  <7%Additional Action Suggested:  >8%      Allergies as of 04/20/2020      Reactions   Sulfa Antibiotics Rash      Medication List       Accurate as of April 20, 2020 11:45 AM. If you have any questions, ask your nurse or doctor.        aspirin 325 MG EC tablet Take 325 mg by  mouth daily.   CALCIUM + D PO Take 600 mg by mouth 2 (two) times daily.   cholecalciferol 1000 units tablet Commonly known as: VITAMIN D Take 1,000 Units by mouth 2 (two) times daily.   clopidogrel 75 MG tablet Commonly known as: PLAVIX TAKE 1 TABLET DAILY   ezetimibe 10 MG tablet Commonly known as: ZETIA TAKE 1 TABLET DAILY   furosemide 20 MG tablet Commonly known as: LASIX Take 20 mg by mouth. Take 1 tablet by mouth as needed for 5 days.   glucose blood test strip Commonly known as: Estate manager/land agent Use as instructed to check sugar 6 times daily. Dx Code E10.65   HumaLOG 100 UNIT/ML injection Generic drug: insulin lispro Inject into the skin. Max 200 units per day with pump   insulin pump Soln Inject into the skin. Novolog Vial, max 200 units per day with pump   losartan 25 MG tablet Commonly known as: COZAAR TAKE 1 TABLET DAILY   Lyumjev 100 UNIT/ML Soln Generic drug: Insulin Lispro-aabc Inject 200 Units as directed daily. Use max of 200 units daily via insulin pump.   metFORMIN 500 MG 24 hr tablet Commonly known as: GLUCOPHAGE-XR TAKE 1 TABLET IN THE MORNING AND 2 TABLETS IN THE EVENING AS DIRECTED   metoprolol succinate 100 MG 24 hr tablet Commonly known as: TOPROL-XL TAKE 1 TABLET DAILY   niacin 1000 MG CR tablet Commonly known as: NIASPAN Take 1 tablet (1,000 mg total) by mouth at bedtime.   nitroGLYCERIN 0.4 MG SL tablet Commonly known as: Nitrostat Place 1 tablet (0.4 mg total) under the tongue every 5 (five) minutes as needed for chest pain.   pioglitazone 15 MG tablet Commonly known as: ACTOS TAKE 1 TABLET DAILY   potassium chloride 10 MEQ tablet Commonly  known as: KLOR-CON Take 1 tablet (10 mEq total) by mouth daily as needed (take with Lasix).   rosuvastatin 40 MG tablet Commonly known as: CRESTOR TAKE 1 TABLET DAILY       Allergies:  Allergies  Allergen Reactions   Sulfa Antibiotics Rash    Past Medical History:  Diagnosis Date   Arthritis    OA   Atrial flutter ([place])    a. s/p TEE/DCCV 08/16/13 (normal LV function, no LAA thrombus).b. s/p ablation by Dr Lovena Le 09-23-2013   Coronary artery disease    2009 LAD Promus stent   Dysrhythmia    Aflutter, no issues after ablation 2014   Fibroid    Hyperlipidemia    Insulin pump in place    Osteopenia    Type 1 diabetes mellitus on insulin therapy ([place])    Valvular heart disease    a. Mild MR/TR by TEE 08/2013.    Past Surgical History:  Procedure Laterality Date   ABLATION  09-23-2013   CTI by Dr Lovena Le   APPENDECTOMY     APPLICATION OF WOUND VAC Left 04/11/2017   Procedure: APPLICATION OF WOUND VAC LEFT KNEE;  Surgeon: Rod Can, MD;  Location: [place];  Service: Orthopedics;  Laterality: Left;   ATRIAL FLUTTER ABLATION N/A 09/23/2013   Procedure: ATRIAL FLUTTER ABLATION;  Surgeon: Evans Lance, MD;  [facility] LAB;  Service: Cardiovascular;  Laterality: N/A;   CARDIOVERSION N/A 08/16/2013   Procedure: CARDIOVERSION;  Surgeon: Thayer Headings, MD;  Location: [facility] ENDOSCOPY;  Service: Cardiovascular;  Laterality: N/A;  spoke with [name]   CATARACT EXTRACTION Bilateral    [place]  MYOMECTOMY  1977   ORIF PATELLA Left 04/11/2017   ORIF PATELLA Left 04/11/2017   Procedure: OPEN REDUCTION INTERNAL (ORIF) FIXATION PATELLA LEFT KNEE;  Surgeon: Rod Can, MD;  Location: [place];  Service: Orthopedics;  Laterality: Left;   PELVIC LAPAROSCOPY     TEE WITHOUT CARDIOVERSION N/A 08/16/2013   Procedure: TRANSESOPHAGEAL ECHOCARDIOGRAM (TEE);  Surgeon: Thayer Headings, MD;  Location: [facility] ENDOSCOPY;   Service: Cardiovascular;  Laterality: N/A;    Family History  Problem Relation Age of Onset   CAD Father 52       died age 48   CAD Brother 34       small vessel disease   Atrial fibrillation Mother    Diabetes Mother    Hypertension Mother    CAD Cousin        paternal   CAD Paternal Grandfather        early onset   Colon cancer Neg Hx     Social History:  reports that she has never smoked. She has never used smokeless tobacco. She reports current alcohol use of about 5.0 standard drinks of alcohol per week. She reports that she does not use drugs.  REVIEW of systems:   Cardiac function: Her echo shows only grade 1 diastolic dysfunction and normal systolic function  She has had atrial arrhythmias treated with metoprolol  Blood pressure usually normal, on 25 mg losartan from cardiologist for preventive reasons Blood pressure was higher on first measurement today  BP Readings from Last 3 Encounters:  04/20/20 128/72  01/20/20 136/84  01/14/20 122/74     She has had diabetic dyslipidemia with significantly high LDL particle number and this has been well controlled with Crestor 40 mg and Niaspan 1000 mg Her last LDL particle number was down to below 900  She is taking her niacin regularly with her Crestor This is controlling HDL and improved particle number  Zetia added in 3/19  LDL 59   Lab Results  Component Value Date   CHOL 126 01/11/2020   HDL 47.00 01/11/2020   LDLCALC 59 01/11/2020   LDLDIRECT 79.0 01/11/2015   TRIG 100.0 01/11/2020   CHOLHDL 3 01/11/2020     No history of thyroid disease  Lab Results  Component Value Date   TSH 2.23 10/06/2019   TSH 1.86 01/19/2019   TSH 1.684 08/15/2013     Has a history of retinopathy and will need report from last exam   BP 128/72    Pulse 67    Ht 5\' 9"  (1.753 m)    Wt 216 lb 3.2 oz (98.1 kg)    SpO2 97%    BMI 31.93 kg/m   Has only occasional ectopics on checking her pulse  today   ASSESSMENT/PLAN:  DIABETES type 1 with inadequate control on Medtronic insulin pump  Her A1c is 8.3 and higher than usual   See history of present illness for detailed discussion of her current blood sugar patterns and management with her pump  Her blood sugars are not as well controlled especially after lunch and not clear why she is requiring more insulin despite making changes on the last visit She has variable postprandial hyperglycemia in the evenings also Also overnight blood sugars are inconsistent She may have benefited from a trial of Lyumjev but she did not call for prescription  She may be able to benefit from Lyumjev compared to Humalog and given her a sample to try it If this is beneficial for  postprandial hyperglycemia we can switch, may also need to consider shortening active insulin time with this Hypoglycemia has been minimal otherwise  Discussed possibly using the T-insulin pump in 5/21 when she is eligible for upgrade unless Medtronic get approval for 770  Recommendations:   Carbohydrate ratio will be 1: 5  With the new insulin she can still bolus 5 to 10-minute before eating  She will make sure she does not suspend her pump when she is taking a shower  She is going to start exercising and encouraged her to do so regularly  Also will need to use temporary basal when blood sugars are near normal before exercise  For now we will keep her on the same basal rates  To call if she is starting to get any hypoglycemia  She was given brochures on the T-insulin pump which she will try to get now since she would benefit from the closed-loop insulin system  No change in Actos  Blood pressure: Although she had a high diastolic initially repeat measurement was normal  Follow-up in 3 months   Patient Instructions  Carb ratio 5 at lunch    Elayne Snare 04/20/20   Note: This office note was prepared with Dragon voice recognition system technology. Any  transcriptional errors that result from this process are unintentional.

## 2020-04-20 ENCOUNTER — Other Ambulatory Visit: Payer: Self-pay

## 2020-04-20 ENCOUNTER — Ambulatory Visit (INDEPENDENT_AMBULATORY_CARE_PROVIDER_SITE_OTHER): Payer: Medicare Other | Admitting: Endocrinology

## 2020-04-20 ENCOUNTER — Encounter: Payer: Self-pay | Admitting: Endocrinology

## 2020-04-20 ENCOUNTER — Other Ambulatory Visit: Payer: Self-pay | Admitting: Endocrinology

## 2020-04-20 VITALS — BP 128/72 | HR 67 | Ht 69.0 in | Wt 216.2 lb

## 2020-04-20 DIAGNOSIS — E1065 Type 1 diabetes mellitus with hyperglycemia: Secondary | ICD-10-CM

## 2020-04-20 DIAGNOSIS — I2510 Atherosclerotic heart disease of native coronary artery without angina pectoris: Secondary | ICD-10-CM

## 2020-04-20 DIAGNOSIS — E782 Mixed hyperlipidemia: Secondary | ICD-10-CM

## 2020-04-20 MED ORDER — LYUMJEV 100 UNIT/ML IJ SOLN: 200.0000 [IU] | Freq: Every day | INTRAMUSCULAR | 0 refills | Status: DC

## 2020-04-20 NOTE — Patient Instructions (Signed)
Carb ratio 5 at lunch

## 2020-05-01 ENCOUNTER — Telehealth: Payer: Self-pay

## 2020-05-01 NOTE — Telephone Encounter (Signed)
FAXED [place]: [name] (Faxed on 04/27/20)  Document: Request for diabetic retinopathy report Other records requested: none at this time  All above requested information has been faxed successfully to the Company listed above. Documents and fax confirmation have been placed in the faxed file for future reference.

## 2020-05-21 ENCOUNTER — Encounter: Payer: Self-pay | Admitting: Cardiovascular Disease

## 2020-05-21 NOTE — Progress Notes (Signed)
Marianna Payment Date of Birth  August 05, 1946 [facility]          [address]    [place]     [place], [place]  [address]           Problems. 1. CAD 2. Atrial Flutter - s/p Aflutter ablation Nov. 2014.  3. Diabetes Mellitus.    [name] is doing very well. She's not having episodes of chest pain or shortness of breath. She's been able to below of her normal activities without any significant problems. She has not had chest pains .  Her insurance will run out the month before she goes on Medicare.   She has not had any CP and no palpitations.    April 08, 2013:   Vittoria is doing well. No CP.  Rhythm is stable.     Mar 11, 2014:  Cheryll is doing well.  She had a atrial flutter ablation several months ago.  She has not been taking her metoprolol..  Nov. 9, 2015:  Juliet is doing well. No Cp , no dyspnea.  Active, not exerciseing as much as she would like.  Mar 16, 2015:   Amory is doing well.   Doing well from a cardiac standpoint.   Nov. 10, 2016  Lots of stress - her 72 yo mom is living with her.  Will not let anyone else do anything Lots of stress No CP , no arrhythmias No time to exercise   Mar 20, 2016:  Doing well Her mother is living back in [place].   Stress is a bit less.  Exercising some .   Nov. 22, 2017.  Doing well . Had afib ablation several years ago and feels great.  DM is well controlled.  Has UTIs frequently .   November 17, 2016: Seen back today for follow-up of her coronary artery disease. Doing well Falls occasionally ,  Broke her left kneecap and left ankle this past year.  Takes the niacin at night and the ASA 81 mg does not conrol the niacin flushng like the 325 mg niacin does.   Jan. 14, 2020  Emmaly is seen today . No CP or dyspnea  Exercising some.   Diabetes is well controlled.   Jan. 18, 2021  Jimesha is seen toda for follow up of her  CAD, atrial flutter, DM, hyperlipidemia Doing well.    VS look  great Exercising regularly .  No CP or dyspnea.  Is on ASA 325 for the effects of niacin at bedtime   May 22, 2020    Chrisma is seen today for follow up of her CAD, atrial flutter, DM, HLD Her daughter and family have moved back to [facility]  No cp, no dyspnea,   No palpitations suggestive of atrial flutter.      Current Outpatient Medications on File Prior to Visit  Medication Sig Dispense Refill  . aspirin 325 MG EC tablet Take 325 mg by mouth daily.    . Calcium Carbonate-Vitamin D (CALCIUM + D PO) Take 600 mg by mouth 2 (two) times daily.     . cholecalciferol (VITAMIN D) 1000 units tablet Take 1,000 Units by mouth 2 (two) times daily.     . clopidogrel (PLAVIX) 75 MG tablet TAKE 1 TABLET DAILY 90 tablet 0  . ezetimibe (ZETIA) 10 MG tablet TAKE 1 TABLET DAILY 90 tablet 0  . furosemide (LASIX) 20 MG tablet Take 20 mg by mouth. Take 1 tablet by  mouth as needed for 5 days.    Marland Kitchen glucose blood (BAYER CONTOUR TEST) test strip Use as instructed to check sugar 6 times daily. Dx Code E10.65 550 each 3  . Insulin Aspart (INSULIN PUMP) 100 unit/ml SOLN Inject into the skin. Novolog Vial, max 200 units per day with pump    . insulin lispro (HUMALOG) 100 UNIT/ML injection Inject into the skin. Max 200 units per day with pump     . losartan (COZAAR) 25 MG tablet TAKE 1 TABLET DAILY 90 tablet 2  . metFORMIN (GLUCOPHAGE-XR) 500 MG 24 hr tablet TAKE 1 TABLET IN THE MORNING AND 2 TABLETS IN THE EVENING AS DIRECTED 270 tablet 3  . metoprolol succinate (TOPROL-XL) 100 MG 24 hr tablet TAKE 1 TABLET DAILY 90 tablet 2  . niacin (NIASPAN) 1000 MG CR tablet Take 1 tablet (1,000 mg total) by mouth at bedtime. 90 tablet 1  . nitroGLYCERIN (NITROSTAT) 0.4 MG SL tablet Place 1 tablet (0.4 mg total) under the tongue every 5 (five) minutes as needed for chest pain. 75 tablet 2  . pioglitazone (ACTOS) 15 MG tablet TAKE 1 TABLET DAILY 90 tablet 3  . potassium chloride (K-DUR) 10 MEQ tablet Take 1 tablet (10 mEq  total) by mouth daily as needed (take with Lasix). 90 tablet 3  . rosuvastatin (CRESTOR) 40 MG tablet TAKE 1 TABLET DAILY 90 tablet 0  . Insulin Lispro-aabc (LYUMJEV) 100 UNIT/ML SOLN Inject 200 Units as directed daily. Use max of 200 units daily via insulin pump. (Patient not taking: Reported on 05/22/2020) 60 mL 0   No current facility-administered medications on file prior to visit.    Allergies  Allergen Reactions  . Sulfa Antibiotics Rash    Past Medical History:  Diagnosis Date  . Arthritis    OA  . Atrial flutter ([place])    a. s/p TEE/DCCV 08/16/13 (normal LV function, no LAA thrombus).b. s/p ablation by Dr Lovena Le 09-23-2013  . Coronary artery disease    2009 LAD Promus stent  . Dysrhythmia    Aflutter, no issues after ablation 2014  . Fibroid   . Hyperlipidemia   . Insulin pump in place   . Osteopenia   . Type 1 diabetes mellitus on insulin therapy ([place])   . Valvular heart disease    a. Mild MR/TR by TEE 08/2013.    Past Surgical History:  Procedure Laterality Date  . ABLATION  09-23-2013   CTI by Dr Lovena Le  . APPENDECTOMY    . APPLICATION OF WOUND VAC Left 04/11/2017   Procedure: APPLICATION OF WOUND VAC LEFT KNEE;  Surgeon: Rod Can, MD;  Location: [place];  Service: Orthopedics;  Laterality: Left;  . ATRIAL FLUTTER ABLATION N/A 09/23/2013   Procedure: ATRIAL FLUTTER ABLATION;  Surgeon: Evans Lance, MD;  [facility] CATH LAB;  Service: Cardiovascular;  Laterality: N/A;  . CARDIOVERSION N/A 08/16/2013   Procedure: CARDIOVERSION;  Surgeon: Thayer Headings, MD;  Location: [place];  Service: Cardiovascular;  Laterality: N/A;  spoke with [name]  . CATARACT EXTRACTION Bilateral   . COLONOSCOPY    . CORONARY ANGIOPLASTY WITH STENT PLACEMENT    . MYOMECTOMY  1977  . ORIF PATELLA Left 04/11/2017  . ORIF PATELLA Left 04/11/2017   Procedure: OPEN REDUCTION INTERNAL (ORIF) FIXATION PATELLA LEFT KNEE;  Surgeon: Rod Can, MD;  Location: [place];  Service:  Orthopedics;  Laterality: Left;  . PELVIC LAPAROSCOPY    . TEE WITHOUT CARDIOVERSION N/A 08/16/2013   Procedure: TRANSESOPHAGEAL  ECHOCARDIOGRAM (TEE);  Surgeon: Thayer Headings, MD;  Location: [facility] ENDOSCOPY;  Service: Cardiovascular;  Laterality: N/A;    Social History   Tobacco Use  Smoking Status Never Smoker  Smokeless Tobacco Never Used    Social History   Substance and Sexual Activity  Alcohol Use Yes  . Alcohol/week: 5.0 standard drinks  . Types: 5 Glasses of wine per week   Comment: on weekends    Family History  Problem Relation Age of Onset  . CAD Father 25       died age 74  . CAD Brother 79       small vessel disease  . Atrial fibrillation Mother   . Diabetes Mother   . Hypertension Mother   . CAD Cousin        paternal  . CAD Paternal Grandfather        early onset  . Colon cancer Neg Hx     Reviw of Systems:  Reviewed in the HPI.  All other systems are negative.   Physical Exam: Blood pressure (!) 118/42, pulse 75, height 5' 9.5" (1.765 m), weight 216 lb (98 kg), SpO2 97 %.  GEN:  Well nourished, well developed in no acute distress HEENT: Normal NECK: No JVD; No carotid bruits LYMPHATICS: No lymphadenopathy CARDIAC: RRR , no murmurs, rubs, gallops RESPIRATORY:  Clear to auscultation without rales, wheezing or rhonchi  ABDOMEN: Soft, non-tender, non-distended MUSCULOSKELETAL:  No edema; No deformity  SKIN: Warm and dry NEUROLOGIC:  Alert and oriented x 3  ECG:    Assessment / Plan:   1. CAD -  She denies any cp.   Encouraged her to continue to work on cardio exercise.    2. Atrial Flutter - . No recurrent a-flutter since ablation   3. Diabetes Mellitus.    HbA1c is 8.3   4. Hperlipidemia:   Check lipids. Liver, bmp in 6 months at her next office visit    Mertie Moores, MD  05/22/2020 2:43 PM    [address],  [place], [place]  [address] Pager [phone] Phone: [phone]; Fax:  [phone]

## 2020-05-22 ENCOUNTER — Other Ambulatory Visit: Payer: Self-pay

## 2020-05-22 ENCOUNTER — Ambulatory Visit (INDEPENDENT_AMBULATORY_CARE_PROVIDER_SITE_OTHER): Payer: Medicare Other | Admitting: Cardiovascular Disease

## 2020-05-22 VITALS — BP 118/42 | HR 75 | Ht 69.5 in | Wt 216.0 lb

## 2020-05-22 DIAGNOSIS — E785 Hyperlipidemia, unspecified: Secondary | ICD-10-CM | POA: Diagnosis not present

## 2020-05-22 DIAGNOSIS — E1169 Type 2 diabetes mellitus with other specified complication: Secondary | ICD-10-CM | POA: Diagnosis not present

## 2020-05-22 DIAGNOSIS — I2510 Atherosclerotic heart disease of native coronary artery without angina pectoris: Secondary | ICD-10-CM

## 2020-05-22 DIAGNOSIS — I10 Essential (primary) hypertension: Secondary | ICD-10-CM

## 2020-05-22 DIAGNOSIS — I483 Typical atrial flutter: Secondary | ICD-10-CM

## 2020-05-22 NOTE — Patient Instructions (Signed)
Medication Instructions:  Your physician recommends that you continue on your current medications as directed. Please refer to the Current Medication list given to you today.  *If you need a refill on your cardiac medications before your next appointment, please call your pharmacy*   Lab Work: Your physician recommends that you return for lab work in: 6 months a few days before your office visit. You will need to fast for this lab work(no solid food after midnight only water of black coffee)  If you have labs (blood work) drawn today and your tests are completely normal, you will receive your results only by: Marland Kitchen MyChart Message (if you have MyChart) OR . A paper copy in the mail If you have any lab test that is abnormal or we need to change your treatment, we will call you to review the results.   Testing/Procedures: None ordered   Follow-Up: At [facility], you and your health needs are our priority.  As part of our continuing mission to provide you with exceptional heart care, we have created designated Provider Care Teams.  These Care Teams include your primary Cardiologist (physician) and Advanced Practice Providers (APPs -  Physician Assistants and Nurse Practitioners) who all work together to provide you with the care you need, when you need it.   Your next appointment:  6 months   The format for your next appointment:   In Person  Provider:   You may see Mertie Moores, MD or one of the following Advanced Practice Providers on your designated Care Team:    Richardson Dopp, PA-C  [place], [place]

## 2020-05-24 ENCOUNTER — Telehealth: Payer: Self-pay | Admitting: Nutrition

## 2020-05-25 NOTE — Telephone Encounter (Signed)
Noted  

## 2020-05-25 NOTE — Telephone Encounter (Signed)
Patient says that medicare says that it is 5 years before Medicare will pay for a new pump.  She has heard back from [employer] with an out of pocket price, and will let me know what she decides.  She says it is a considerable cost.

## 2020-05-29 ENCOUNTER — Telehealth: Payer: Self-pay | Admitting: Internal Medicine

## 2020-05-29 NOTE — Telephone Encounter (Signed)
New message:   Pt is calling and states her son in law has tested positive for Covid. She states she has not been in direct contact with him but she has with her daughter about 2 hours. She states they both have been vaccinated but should they be tested. She also states they are not having any symptoms at this time and have been vaccinated. Please advise.

## 2020-05-29 NOTE — Telephone Encounter (Signed)
Pt has been informed.

## 2020-05-29 NOTE — Telephone Encounter (Signed)
I would recommend her daughters contact their healthcare providers since I do not know their medical information it is hard to say. She did not have direct contact so I would just monitor for symptoms and if she has any symptoms she should get tested.

## 2020-06-07 DIAGNOSIS — B349 Viral infection, unspecified: Secondary | ICD-10-CM | POA: Diagnosis not present

## 2020-06-07 DIAGNOSIS — Z20822 Contact with and (suspected) exposure to covid-19: Secondary | ICD-10-CM | POA: Diagnosis not present

## 2020-06-07 DIAGNOSIS — U071 COVID-19: Secondary | ICD-10-CM | POA: Diagnosis not present

## 2020-06-07 DIAGNOSIS — N390 Urinary tract infection, site not specified: Secondary | ICD-10-CM | POA: Diagnosis not present

## 2020-06-07 DIAGNOSIS — R509 Fever, unspecified: Secondary | ICD-10-CM | POA: Diagnosis not present

## 2020-06-28 ENCOUNTER — Telehealth: Payer: Self-pay | Admitting: Endocrinology

## 2020-06-28 NOTE — Telephone Encounter (Signed)
Medical necessity form has been placed on Dr. Ronnie Derby desk for signature

## 2020-07-08 ENCOUNTER — Other Ambulatory Visit: Payer: Self-pay | Admitting: Cardiovascular Disease

## 2020-07-18 DIAGNOSIS — Z23 Encounter for immunization: Secondary | ICD-10-CM | POA: Diagnosis not present

## 2020-07-19 ENCOUNTER — Other Ambulatory Visit (INDEPENDENT_AMBULATORY_CARE_PROVIDER_SITE_OTHER): Payer: Medicare Other

## 2020-07-19 ENCOUNTER — Other Ambulatory Visit: Payer: Self-pay

## 2020-07-19 DIAGNOSIS — E1065 Type 1 diabetes mellitus with hyperglycemia: Secondary | ICD-10-CM

## 2020-07-19 DIAGNOSIS — E782 Mixed hyperlipidemia: Secondary | ICD-10-CM | POA: Diagnosis not present

## 2020-07-19 LAB — COMPREHENSIVE METABOLIC PANEL
ALT: 22 U/L (ref 0–35)
AST: 19 U/L (ref 0–37)
Albumin: 3.8 g/dL (ref 3.5–5.2)
Alkaline Phosphatase: 83 U/L (ref 39–117)
BUN: 15 mg/dL (ref 6–23)
CO2: 27 mEq/L (ref 19–32)
Calcium: 9 mg/dL (ref 8.4–10.5)
Chloride: 104 mEq/L (ref 96–112)
Creatinine, Ser: 0.86 mg/dL (ref 0.40–1.20)
GFR: 64.75 mL/min (ref >60.00)
Glucose, Bld: 127 mg/dL — ABNORMAL HIGH (ref 70–99)
Potassium: 3.8 mEq/L (ref 3.5–5.1)
Sodium: 137 mEq/L (ref 135–145)
Total Bilirubin: 0.6 mg/dL (ref 0.2–1.2)
Total Protein: 6.5 g/dL (ref 6.0–8.3)

## 2020-07-19 LAB — LIPID PANEL
Cholesterol: 100 mg/dL (ref 0–200)
HDL: 47.6 mg/dL (ref >39.00)
LDL Cholesterol: 40 mg/dL (ref 0–99)
NonHDL: 52.47
Total CHOL/HDL Ratio: 2
Triglycerides: 63 mg/dL (ref 0.0–149.0)
VLDL: 12.6 mg/dL (ref 0.0–40.0)

## 2020-07-19 LAB — HEMOGLOBIN A1C: Hgb A1c MFr Bld: 7.4 % — ABNORMAL HIGH (ref 4.6–6.5)

## 2020-07-20 ENCOUNTER — Other Ambulatory Visit: Payer: Medicare Other

## 2020-07-21 ENCOUNTER — Telehealth: Payer: Self-pay

## 2020-07-21 NOTE — Telephone Encounter (Signed)
Received notification from [facility] that pt will require a current appt and current Cpeptide along with Fasting glucose. Per Kyung Rudd, previous notes and labs does not pass Medicare guidelines. Pt is scheduled for an appt to have same day labs on 07/25/20. Will need to fax this information once completed and results received.

## 2020-07-22 ENCOUNTER — Other Ambulatory Visit: Payer: Self-pay | Admitting: Endocrinology

## 2020-07-25 ENCOUNTER — Ambulatory Visit (INDEPENDENT_AMBULATORY_CARE_PROVIDER_SITE_OTHER): Payer: Medicare Other | Admitting: Endocrinology

## 2020-07-25 ENCOUNTER — Other Ambulatory Visit: Payer: Self-pay

## 2020-07-25 ENCOUNTER — Telehealth: Payer: Self-pay

## 2020-07-25 ENCOUNTER — Other Ambulatory Visit: Payer: Self-pay | Admitting: Endocrinology

## 2020-07-25 VITALS — BP 146/66 | HR 76 | Ht 69.0 in | Wt 214.0 lb

## 2020-07-25 DIAGNOSIS — E782 Mixed hyperlipidemia: Secondary | ICD-10-CM

## 2020-07-25 DIAGNOSIS — E1065 Type 1 diabetes mellitus with hyperglycemia: Secondary | ICD-10-CM

## 2020-07-25 DIAGNOSIS — I2510 Atherosclerotic heart disease of native coronary artery without angina pectoris: Secondary | ICD-10-CM | POA: Diagnosis not present

## 2020-07-25 MED ORDER — PIOGLITAZONE HCL 15 MG PO TABS
15.0000 mg | ORAL_TABLET | Freq: Every day | ORAL | 3 refills | Status: DC
Start: 2020-07-25 — End: 2021-04-30

## 2020-07-25 MED ORDER — LYUMJEV 100 UNIT/ML IJ SOLN
INTRAMUSCULAR | 3 refills | Status: DC
Start: 2020-07-25 — End: 2021-06-04

## 2020-07-25 NOTE — Progress Notes (Signed)
Patient ID: [name], female   DOB: 11-17-1945, 74 y.o.   MRN: [id number]   Reason for visit: DIABETES followup.  Diagnosis: Type 1 diabetes mellitus, date of diagnosis: 1979.    HISTORY of present illness:   Insulin Pump: CURRENT brand: Medtronic 630  BASAL settings:  Midnight = 0.55, 3:30 AM = 1.2, 6 AM = 2.25, 10 AM = 0.75,  2 PM = 1.25, 5 PM = 2.1, 7:30 PM = 2.1, 8 PM = 3.2 and 10 PM = 1.8.    Total daily insulin = usually about 65 units, maximum 90   Carbohydrate ratio 1: 12 breakfast, 1: 5 lunch and 1: 4.5 at dinner; sensitivity MN= 1:30; 1: 25 from 5 AM-7 AM, 7 AM-12 AM = 35; target 120-130  Insulin on board indicator on, duration 4 hrs   DIABETES: She has had long-standing diabetes with A1c levels usually above target. In 10/13 pioglitazone was restarted because of her large insulin requirement and diabetic dyslipidemia.  Her blood sugars improved significantly with this.  Also has been taking metformin as insulin sensitizer  RECENT history:   Her A1c is improved at 7.4 compared to 8.3,  Current diabetes management, blood sugar patterns from sensor and pump download:  Problems with management:  She said that she did try Lyumjev and it works faster but she did not however request a refill and is back on Humalog recently  However if she has significantly high readings in the afternoons frequently and sometimes overnight  Not clear if she is forgetting to bolus at lunchtime  Last evening she had a couple of episodes of low blood sugar for no apparent reason except possibly being more active in the afternoon  Subsequently she had rebound high blood sugars during the night and more recently her nighttime readings may be relatively higher also  Weight is about the same  She does have the T-insulin pump available but not daily supplies for this    CONTINUOUS GLUCOSE MONITORING RECORD INTERPRETATION    Dates of Recording: Last 2 weeks  Sensor  description: G6  Results statistics:   CGM use % of time  91  Average and SD  167, +/-68   Time in range       62%  % Time Above 180  35  % Time above 250  15  % Time Below target  2.3    PRE-MEAL Fasting Lunch Dinner Bedtime Overall  Glucose range:       Mean/median: 145 145 160      Glycemic patterns summary: Blood sugar variability is significantly more than on her last visit although timing range is about the same. HIGHEST blood sugars on average are around 4-5 PM, lowest blood sugars midmorning.  Hypoglycemia has been minimal  Hyperglycemic episodes occurred most significantly in the afternoons, now fairly consistently late afternoon.  Also more recently has had relatively higher readings above target overnight   Hypoglycemic episodes occurred rarely, mostly last evening  Overnight periods: No consistent pattern, has some nights her blood sugars are excellent and stable A few nights blood sugars are rising significantly especially recently  Preprandial periods: Blood sugars are variable but most recently generally higher at breakfast, fairly good at lunchtime and variably high at dinnertime  Postprandial periods:     After breakfast: Blood sugars are generally very evenly controlled After lunch:   She has only a few lunch boluses and occasionally blood sugars do rise significantly After dinner: Significant variability  in the blood sugar patterns but generally not rising significantly, blood sugar was relatively lower only last evening  Previous data:    CGM use % of time  98  Average and SD  162+/-55  Time in range     60   %, was 70  % Time Above 180  37  % Time above 250  5  % Time Below target  2.7    Glycemic patterns summary: She has overall significant hyperglycemia except midmorning and early evenings. Hyperglycemia is more consistent in the mid afternoon between about 2-5 PM but is variably high also late evening and overnight   Hypoglycemic awareness:  Has symptoms of feeling tired, headache, sweaty.  She thinks he can usually recognize symptoms  Sometimes may not be aware during the night and will be notified by her sensor that the sugar is low  She has symptoms when blood glucose is less than 60. Uses glucose tablets for treatment even overnight     DIET has been usually fairly controlled with carbohydrate and fat intake, tends to eat out on weekends The mealtimes are variable.    Food preferences: Yogurt and cottage cheese for breakfast usually.   Blood sugars with exercise: No significant problems recently with adequate control and no hypoglycemia  .  Wt Readings from Last 3 Encounters:  07/25/20 214 lb (97.1 kg)  05/22/20 216 lb (98 kg)  04/20/20 216 lb 3.2 oz (98.1 kg)    LABS:  Lab Results  Component Value Date   HGBA1C 7.4 (H) 07/19/2020   HGBA1C 8.3 (H) 04/18/2020   HGBA1C 7.2 (H) 01/11/2020   Lab Results  Component Value Date   MICROALBUR <0.7 04/18/2020   LDLCALC 40 07/19/2020   CREATININE 0.86 07/19/2020    Lab on 07/19/2020  Component Date Value Ref Range Status  . Cholesterol 07/19/2020 100  0 - 200 mg/dL Final   ATP III Classification       Desirable:  < 200 mg/dL               Borderline High:  200 - 239 mg/dL          High:  > = 240 mg/dL  . Triglycerides 07/19/2020 63.0  0 - 149 mg/dL Final   Normal:  <150 mg/dLBorderline High:  150 - 199 mg/dL  . HDL 07/19/2020 47.60  >39.00 mg/dL Final  . VLDL 07/19/2020 12.6  0.0 - 40.0 mg/dL Final  . LDL Cholesterol 07/19/2020 40  0 - 99 mg/dL Final  . Total CHOL/HDL Ratio 07/19/2020 2   Final                  Men          Women1/2 Average Risk     3.4          3.3Average Risk          5.0          4.42X Average Risk          9.6          7.13X Average Risk          15.0          11.0                      . NonHDL 07/19/2020 52.47   Final   NOTE:  Non-HDL goal should be 30 mg/dL higher than patient's LDL goal (i.e. LDL goal of <  70 mg/dL, would have non-HDL goal  of < 100 mg/dL)  . Sodium 07/19/2020 137  135 - 145 mEq/L Final  . Potassium 07/19/2020 3.8  3.5 - 5.1 mEq/L Final  . Chloride 07/19/2020 104  96 - 112 mEq/L Final  . CO2 07/19/2020 27  19 - 32 mEq/L Final  . Glucose, Bld 07/19/2020 127* 70 - 99 mg/dL Final  . BUN 07/19/2020 15  6 - 23 mg/dL Final  . Creatinine, Ser 07/19/2020 0.86  0.40 - 1.20 mg/dL Final  . Total Bilirubin 07/19/2020 0.6  0.2 - 1.2 mg/dL Final  . Alkaline Phosphatase 07/19/2020 83  39 - 117 U/L Final  . AST 07/19/2020 19  0 - 37 U/L Final  . ALT 07/19/2020 22  0 - 35 U/L Final  . Total Protein 07/19/2020 6.5  6.0 - 8.3 g/dL Final  . Albumin 07/19/2020 3.8  3.5 - 5.2 g/dL Final  . GFR 07/19/2020 64.75  >60.00 mL/min Final  . Calcium 07/19/2020 9.0  8.4 - 10.5 mg/dL Final  . Hgb A1c MFr Bld 07/19/2020 7.4* 4.6 - 6.5 % Final   Glycemic Control Guidelines for People with Diabetes:Non Diabetic:  <6%Goal of Therapy: <7%Additional Action Suggested:  >8%      Allergies as of 07/25/2020      Reactions   Sulfa Antibiotics Rash      Medication List       Accurate as of July 25, 2020 10:15 AM. If you have any questions, ask your nurse or doctor.        aspirin 325 MG EC tablet Take 325 mg by mouth daily.   CALCIUM + D PO Take 600 mg by mouth 2 (two) times daily.   cholecalciferol 1000 units tablet Commonly known as: VITAMIN D Take 1,000 Units by mouth 2 (two) times daily.   clopidogrel 75 MG tablet Commonly known as: PLAVIX TAKE 1 TABLET DAILY   Dexcom G6 Sensor Misc by Does not apply route.   ezetimibe 10 MG tablet Commonly known as: ZETIA TAKE 1 TABLET DAILY   furosemide 20 MG tablet Commonly known as: LASIX Take 20 mg by mouth. Take 1 tablet by mouth as needed for 5 days.   glucose blood test strip Commonly known as: Estate manager/land agent Use as instructed to check sugar 6 times daily. Dx Code E10.65   HumaLOG 100 UNIT/ML injection Generic drug: insulin lispro Inject into the skin. Max 200  units per day with pump   insulin pump Soln Inject into the skin. Novolog Vial, max 200 units per day with pump   losartan 25 MG tablet Commonly known as: COZAAR TAKE 1 TABLET DAILY   Lyumjev 100 UNIT/ML Soln Generic drug: Insulin Lispro-aabc Inject 200 Units as directed daily. Use max of 200 units daily via insulin pump.   metFORMIN 500 MG 24 hr tablet Commonly known as: GLUCOPHAGE-XR TAKE 1 TABLET IN THE MORNING AND 2 TABLETS IN THE EVENING AS DIRECTED   metoprolol succinate 100 MG 24 hr tablet Commonly known as: TOPROL-XL TAKE 1 TABLET DAILY   niacin 1000 MG CR tablet Commonly known as: NIASPAN Take 1 tablet (1,000 mg total) by mouth at bedtime.   nitroGLYCERIN 0.4 MG SL tablet Commonly known as: Nitrostat Place 1 tablet (0.4 mg total) under the tongue every 5 (five) minutes as needed for chest pain.   pioglitazone 15 MG tablet Commonly known as: ACTOS TAKE 1 TABLET DAILY   potassium chloride 10 MEQ tablet Commonly known as: KLOR-CON Take  1 tablet (10 mEq total) by mouth daily as needed (take with Lasix).   rosuvastatin 40 MG tablet Commonly known as: CRESTOR TAKE 1 TABLET DAILY       Allergies:  Allergies  Allergen Reactions  . Sulfa Antibiotics Rash    Past Medical History:  Diagnosis Date  . Arthritis    OA  . Atrial flutter ([place])    a. s/p TEE/DCCV 08/16/13 (normal LV function, no LAA thrombus).b. s/p ablation by Dr Lovena Le 09-23-2013  . Coronary artery disease    2009 LAD Promus stent  . Dysrhythmia    Aflutter, no issues after ablation 2014  . Fibroid   . Hyperlipidemia   . Insulin pump in place   . Osteopenia   . Type 1 diabetes mellitus on insulin therapy ([place])   . Valvular heart disease    a. Mild MR/TR by TEE 08/2013.    Past Surgical History:  Procedure Laterality Date  . ABLATION  09-23-2013   CTI by Dr Lovena Le  . APPENDECTOMY    . APPLICATION OF WOUND VAC Left 04/11/2017   Procedure: APPLICATION OF WOUND VAC LEFT KNEE;  Surgeon:  Rod Can, MD;  Location: [place];  Service: Orthopedics;  Laterality: Left;  . ATRIAL FLUTTER ABLATION N/A 09/23/2013   Procedure: ATRIAL FLUTTER ABLATION;  Surgeon: Evans Lance, MD;  Location: [facility] CATH LAB;  Service: Cardiovascular;  Laterality: N/A;  . CARDIOVERSION N/A 08/16/2013   Procedure: CARDIOVERSION;  Surgeon: Thayer Headings, MD;  Location: [place];  Service: Cardiovascular;  Laterality: N/A;  spoke with [name]  . CATARACT EXTRACTION Bilateral   . COLONOSCOPY    . CORONARY ANGIOPLASTY WITH STENT PLACEMENT    . MYOMECTOMY  1977  . ORIF PATELLA Left 04/11/2017  . ORIF PATELLA Left 04/11/2017   Procedure: OPEN REDUCTION INTERNAL (ORIF) FIXATION PATELLA LEFT KNEE;  Surgeon: Rod Can, MD;  Location: [place];  Service: Orthopedics;  Laterality: Left;  . PELVIC LAPAROSCOPY    . TEE WITHOUT CARDIOVERSION N/A 08/16/2013   Procedure: TRANSESOPHAGEAL ECHOCARDIOGRAM (TEE);  Surgeon: Thayer Headings, MD;  Location: [facility];  Service: Cardiovascular;  Laterality: N/A;    Family History  Problem Relation Age of Onset  . CAD Father 16       died age 1  . CAD Brother 37       small vessel disease  . Atrial fibrillation Mother   . Diabetes Mother   . Hypertension Mother   . CAD Cousin        paternal  . CAD Paternal Grandfather        early onset  . Colon cancer Neg Hx     Social History:  reports that she has never smoked. She has never used smokeless tobacco. She reports current alcohol use of about 5.0 standard drinks of alcohol per week. She reports that she does not use drugs.  REVIEW of systems:   Cardiac function: Her echo shows only grade 1 diastolic dysfunction and normal systolic function  She has had atrial arrhythmias treated with metoprolol  Blood pressure usually normal, on 25 mg losartan from cardiologist for preventive reasons  Blood pressure was higher on first measurement today, does not monitor at home  BP Readings from Last 3 Encounters:    07/25/20 (!) 146/66  05/22/20 (!) 118/42  04/20/20 128/72     She has had diabetic dyslipidemia with significantly high LDL particle number and this has been well controlled with Crestor 40 mg and Niaspan  1000 mg Her last LDL particle number was down to below 900  She is taking her niacin regularly with her Crestor This is controlling HDL and improved particle number  Zetia added in 3/19  Labs as follows:   Lab Results  Component Value Date   CHOL 100 07/19/2020   HDL 47.60 07/19/2020   LDLCALC 40 07/19/2020   LDLDIRECT 79.0 01/11/2015   TRIG 63.0 07/19/2020   CHOLHDL 2 07/19/2020     She has had consistently normal thyroid levels  Lab Results  Component Value Date   TSH 2.23 10/06/2019   TSH 1.86 01/19/2019   TSH 1.684 08/15/2013    No retinopathy reportedly on last exam in 11/20   BP (!) 146/66   Pulse 76   Ht 5\' 9"  (1.753 m)   Wt 214 lb (97.1 kg)   SpO2 97%   BMI 31.60 kg/m      ASSESSMENT/PLAN:  DIABETES type 1 on insulin pump  Her A1c is down to 7.4   See history of present illness for detailed discussion of her current blood sugar patterns and management with her pump  Her blood sugars are fluctuating more as discussed above Currently still on the 630 Medtronic pump but is awaiting to start T-insulin  Highest blood sugars are in the afternoon and occasionally overnight also as well as fasting recently  Recommendations:   Carbohydrate ratio will be the same as before including 1: 5 at lunch  Basal rate will be increased to 1.30 at 3 AM and up to 2.35 at 6 AM.  Basal rate from 1 PM-3:30 PM will be 1.45 instead of 1.25  She will call if she has any consistently high or low patterns  She will call to schedule the pump training when all supplies are available  Continue Actos for multiple benefits  Blood pressure: Again appears to have some whitecoat syndrome and recommended checking periodically at home  LIPIDS: Excellent control with  LDL below 70 again, HDL has been fairly good with Actos  Follow-up in 3 months   There are no Patient Instructions on file for this visit.   Elayne Snare 07/25/20   Note: This office note was prepared with Dragon voice recognition system technology. Any transcriptional errors that result from this process are unintentional.

## 2020-07-25 NOTE — Telephone Encounter (Signed)
New message    The patient will discuss when the nurse call back on insulin pump due to details / instruction.

## 2020-07-26 NOTE — Telephone Encounter (Signed)
Patient states that paperwork for her pump supplies need to be completed and that we need to send a new script, labs, and notes.  Patient states that everything has to be sent together are they will not take it . Paperwork is in your folder to completed.

## 2020-07-26 NOTE — Telephone Encounter (Signed)
Will try to do it as soon as possible

## 2020-07-28 ENCOUNTER — Telehealth: Payer: Self-pay

## 2020-07-28 NOTE — Telephone Encounter (Signed)
Forms have been faxed again.  

## 2020-08-02 NOTE — Telephone Encounter (Signed)
F/u   The patient is checking on previous message insulin pump supplies. New pump for over two weeks now . Asking for a call back   Need these three thing medicare required   1. Prescription   2. MD notes from office visit with Dr. [name] 9.23.2021  3. Labs done on 9.15.21

## 2020-08-03 NOTE — Telephone Encounter (Signed)
Form has been faxed again to [facility] at 9:10am

## 2020-08-11 ENCOUNTER — Telehealth: Payer: Self-pay

## 2020-08-11 DIAGNOSIS — Z23 Encounter for immunization: Secondary | ICD-10-CM | POA: Diagnosis not present

## 2020-08-11 NOTE — Telephone Encounter (Signed)
Patient called [facility] asking if they got what we sent and they said they still have not gotten it. Asking if we could please resend it.

## 2020-08-11 NOTE — Telephone Encounter (Signed)
Pt requesting provider staff call Kyung Rudd [phone] Helene Kelp, to straighten out what is needed for pt to get the supplies. Please call. [facility] says they do not have any from [place].

## 2020-08-14 NOTE — Telephone Encounter (Signed)
Notified pt faxed the document on the 08/02/2020. Re-faxed complete documentation to [phone] today.

## 2020-08-15 ENCOUNTER — Telehealth: Payer: Self-pay | Admitting: Endocrinology

## 2020-08-15 NOTE — Telephone Encounter (Signed)
Patient called to advise that the information that information that Kyung Rudd is actually needing is a confirmation that the patients labs from 05/09/2016 were fasting.  Per patients [facility] Dedicated Equipment Coordinator - Helene Kelp  [phone]).  We need one sheet of paper that states that the C-Peptide from 05/09/2016 was fasting ad this needs signed by someone in our office, dated and returned by fax. Helene Kelp, her Dedicated Mining engineer  [phone]) did fax a single sheet of our office on 08/12/2020 that can be completed with the above data and returned.  Per Helene Kelp her [place] Coordinator [phone]) this one piece of paper will the [employer] insulin supplies can be sent to patient.  Please let patient know when this is returned to [facility]. This way the patient can follow up Helene Kelp her [facility] Dedicated Equipment Coordinator [phone])

## 2020-08-15 NOTE — Telephone Encounter (Signed)
That can be done. Please print the C-peptide and the glucose reports for that date for me to sign

## 2020-08-15 NOTE — Telephone Encounter (Signed)
So [facility] is requesting for the C-peptide to say fasting on it. The lab was done 2017. Are you okay with writing fasting on the lab or do you want to order another lab.

## 2020-08-16 NOTE — Telephone Encounter (Signed)
Spoke with and let her know that Dr. [name] will be able to sign the lab and state that it's fasting

## 2020-08-16 NOTE — Telephone Encounter (Signed)
Lab faxed at 8:23am and have been signed and state that they are fasting

## 2020-09-05 ENCOUNTER — Encounter: Payer: Self-pay | Admitting: Family

## 2020-09-05 ENCOUNTER — Other Ambulatory Visit: Payer: Self-pay

## 2020-09-05 ENCOUNTER — Ambulatory Visit (INDEPENDENT_AMBULATORY_CARE_PROVIDER_SITE_OTHER): Payer: Medicare Other | Admitting: Family

## 2020-09-05 VITALS — BP 122/58 | HR 75 | Temp 98.8°F | Ht 69.0 in | Wt 218.0 lb

## 2020-09-05 DIAGNOSIS — R399 Unspecified symptoms and signs involving the genitourinary system: Secondary | ICD-10-CM

## 2020-09-05 LAB — POCT URINALYSIS DIPSTICK
Bilirubin, UA: NEGATIVE
Blood, UA: POSITIVE
Glucose, UA: NEGATIVE
Ketones, UA: NEGATIVE
Leukocytes, UA: NEGATIVE
Nitrite, UA: NEGATIVE
Protein, UA: NEGATIVE
Spec Grav, UA: >=1.03 — AB (ref 1.010–1.025)
Urobilinogen, UA: 0.2 E.U./dL
pH, UA: 6 (ref 5.0–8.0)

## 2020-09-05 MED ORDER — NITROFURANTOIN MONOHYD MACRO 100 MG PO CAPS: 100.0000 mg | ORAL_CAPSULE | Freq: Two times a day (BID) | ORAL | 0 refills | Status: DC

## 2020-09-05 NOTE — Progress Notes (Signed)
[name] is a 74 y.o. female with the following history as recorded in EpicCare:  Patient Active Problem List   Diagnosis Date Noted  . Hypertension 05/13/2019  . Urinary incontinence 01/19/2019  . Valvular heart disease   . Insulin pump in place   . Fibroid   . Osteoarthritis   . Displaced comminuted fracture of left patella, initial encounter for closed fracture 04/11/2017  . Routine general medical examination at a health care facility 11/08/2015  . Hyperlipidemia associated with type 2 diabetes mellitus ([place]) 03/16/2015  . 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 Uncontrolled type 1 diabetes mellitus ([place]) 05/12/2013  . Chronic insomnia 03/25/2012  . Osteopenia   . Coronary artery disease 04/05/2011  . Atrial flutter ([place]) 04/05/2011    Current Outpatient Medications  Medication Sig Dispense Refill  . aspirin 325 MG EC tablet Take 325 mg by mouth daily.    . Calcium Carbonate-Vitamin D (CALCIUM + D PO) Take 600 mg by mouth 2 (two) times daily.     . cholecalciferol (VITAMIN D) 1000 units tablet Take 1,000 Units by mouth 2 (two) times daily.     . clopidogrel (PLAVIX) 75 MG tablet TAKE 1 TABLET DAILY 90 tablet 3  . Continuous Blood Gluc Sensor (DEXCOM G6 SENSOR) MISC by Does not apply route.    . ezetimibe (ZETIA) 10 MG tablet TAKE 1 TABLET DAILY 90 tablet 3  . furosemide (LASIX) 20 MG tablet Take 20 mg by mouth. Take 1 tablet by mouth as needed for 5 days.    Marland Kitchen glucose blood (BAYER CONTOUR TEST) test strip Use as instructed to check sugar 6 times daily. Dx Code E10.65 550 each 3  . Insulin Aspart (INSULIN PUMP) 100 unit/ml SOLN Inject into the skin. Novolog Vial, max 200 units per day with pump    . Insulin Lispro-aabc (LYUMJEV) 100 UNIT/ML SOLN Use max of 90 units daily via insulin pump. 80 mL 3  . losartan (COZAAR) 25 MG tablet TAKE 1 TABLET DAILY 90 tablet 3  . metFORMIN (GLUCOPHAGE-XR) 500 MG 24 hr tablet TAKE 1 TABLET IN THE MORNING AND 2 TABLETS  IN THE EVENING AS DIRECTED 270 tablet 3  . metoprolol succinate (TOPROL-XL) 100 MG 24 hr tablet TAKE 1 TABLET DAILY 90 tablet 3  . niacin (NIASPAN) 1000 MG CR tablet Take 1 tablet (1,000 mg total) by mouth at bedtime. 90 tablet 1  . nitroGLYCERIN (NITROSTAT) 0.4 MG SL tablet Place 1 tablet (0.4 mg total) under the tongue every 5 (five) minutes as needed for chest pain. 75 tablet 2  . pioglitazone (ACTOS) 15 MG tablet Take 1 tablet (15 mg total) by mouth daily. 90 tablet 3  . potassium chloride (K-DUR) 10 MEQ tablet Take 1 tablet (10 mEq total) by mouth daily as needed (take with Lasix). 90 tablet 3  . rosuvastatin (CRESTOR) 40 MG tablet TAKE 1 TABLET DAILY 90 tablet 3   No current facility-administered medications for this visit.    Allergies: Sulfa antibiotics  Past Medical History:  Diagnosis Date  . Arthritis    OA  . Atrial flutter ([place])    a. s/p TEE/DCCV 08/16/13 (normal LV function, no LAA thrombus).b. s/p ablation by Dr Lovena Le 09-23-2013  . Coronary artery disease    2009 LAD Promus stent  . Dysrhythmia    Aflutter, no issues after ablation 2014  . Fibroid   . Hyperlipidemia   . Insulin pump in place   . Osteopenia   .  Type 1 diabetes mellitus on insulin therapy ([place])   . Valvular heart disease    a. Mild MR/TR by TEE 08/2013.    Past Surgical History:  Procedure Laterality Date  . ABLATION  09-23-2013   CTI by Dr Lovena Le  . APPENDECTOMY    . APPLICATION OF WOUND VAC Left 04/11/2017   Procedure: APPLICATION OF WOUND VAC LEFT KNEE;  Surgeon: Rod Can, MD;  Location: [place];  Service: Orthopedics;  Laterality: Left;  . ATRIAL FLUTTER ABLATION N/A 09/23/2013   Procedure: ATRIAL FLUTTER ABLATION;  Surgeon: Evans Lance, MD;  Location: [facility] CATH LAB;  Service: Cardiovascular;  Laterality: N/A;  . CARDIOVERSION N/A 08/16/2013   Procedure: CARDIOVERSION;  Surgeon: Thayer Headings, MD;  Location: [place];  Service: Cardiovascular;  Laterality: N/A;  spoke with [name]  .  CATARACT EXTRACTION Bilateral   . COLONOSCOPY    . CORONARY ANGIOPLASTY WITH STENT PLACEMENT    . MYOMECTOMY  1977  . ORIF PATELLA Left 04/11/2017  . ORIF PATELLA Left 04/11/2017   Procedure: OPEN REDUCTION INTERNAL (ORIF) FIXATION PATELLA LEFT KNEE;  Surgeon: Rod Can, MD;  Location: [place];  Service: Orthopedics;  Laterality: Left;  . PELVIC LAPAROSCOPY    . TEE WITHOUT CARDIOVERSION N/A 08/16/2013   Procedure: TRANSESOPHAGEAL ECHOCARDIOGRAM (TEE);  Surgeon: Thayer Headings, MD;  Location: [facility] ENDOSCOPY;  Service: Cardiovascular;  Laterality: N/A;    Family History  Problem Relation Age of Onset  . CAD Father 23       died age 27  . CAD Brother 77       small vessel disease  . Atrial fibrillation Mother   . Diabetes Mother   . Hypertension Mother   . CAD Cousin        paternal  . CAD Paternal Grandfather        early onset  . Colon cancer Neg Hx     Social History   Tobacco Use  . Smoking status: Never Smoker  . Smokeless tobacco: Never Used  Substance Use Topics  . Alcohol use: Yes    Alcohol/week: 5.0 standard drinks    Types: 5 Glasses of wine per week    Comment: on weekends    Subjective:   Presents with concerns for UTI today; urgency/ increased problems with incontinence; symptoms x 2 weeks; no fever; has not had any concerning blood sugar readings;   Objective:  Vitals:   09/05/20 1101  BP: (!) 122/58  Pulse: 75  Temp: 98.8 F (37.1 C)  TempSrc: Oral  SpO2: 97%  Weight: 218 lb (98.9 kg)  Height: 5\' 9"  (1.753 m)    General: Well developed, well nourished, in no acute distress  Head: Normocephalic and atraumatic  Lungs: Respirations unlabored;  Neurologic: Alert and oriented; speech intact; face symmetrical;   Assessment:  1. UTI symptoms     Plan:  Check U/A; unable to get urine culture; Rx for Macrobid 100 mg bid x 5 days; follow-up worse, no better.  This visit occurred during the SARS-CoV-2 public health emergency.  Safety protocols were  in place, including screening questions prior to the visit, additional usage of staff PPE, and extensive cleaning of exam room while observing appropriate contact time as indicated for disinfecting solutions.     No follow-ups on file.  Orders Placed This Encounter  Procedures  . POCT urinalysis dipstick    Requested Prescriptions    No prescriptions requested or ordered in this encounter

## 2020-09-11 DIAGNOSIS — E109 Type 1 diabetes mellitus without complications: Secondary | ICD-10-CM | POA: Diagnosis not present

## 2020-09-14 ENCOUNTER — Encounter: Payer: Self-pay | Admitting: Endocrinology

## 2020-09-14 ENCOUNTER — Other Ambulatory Visit: Payer: Self-pay

## 2020-09-14 ENCOUNTER — Ambulatory Visit (INDEPENDENT_AMBULATORY_CARE_PROVIDER_SITE_OTHER): Payer: Medicare Other | Admitting: Endocrinology

## 2020-09-14 VITALS — BP 136/72 | HR 76 | Ht 69.0 in | Wt 216.8 lb

## 2020-09-14 DIAGNOSIS — I2510 Atherosclerotic heart disease of native coronary artery without angina pectoris: Secondary | ICD-10-CM

## 2020-09-14 DIAGNOSIS — E1065 Type 1 diabetes mellitus with hyperglycemia: Secondary | ICD-10-CM

## 2020-09-14 NOTE — Progress Notes (Signed)
[name] ID: [name], female   DOB: [birth year], 74 y.o.   MRN: [id number]   Reason for visit: DIABETES followup.  Diagnosis: Type 1 diabetes mellitus, date of diagnosis: 1979.    HISTORY of present illness:   Insulin Pump: CURRENT brand: T-Slim  BASAL settings:  Midnight = 0.55, 3:30 AM = 1.2, 6 AM = 2.25, 10 AM = 0.75,  2 PM = 1.25, 5 PM = 2.1, 7:30 PM = 2.1, 8 PM = 3.2 and 10 PM = 1.8.    Total daily insulin = usually about 65 units, maximum 90   Carbohydrate ratio 1: 12 breakfast, 1: 5 lunch and 1: 4.5 at dinner; sensitivity MN= 1:30; 1: 25 from 5 AM-7 AM, 7 AM-12 AM = 35; target 120-130  Insulin on board indicator on, duration 4 hrs   DIABETES: [name] has had long-standing diabetes with A1c levels usually above target. In 10/13 pioglitazone was restarted because of [name] large insulin requirement and diabetic dyslipidemia.  [name] blood sugars improved significantly with this.  Also has been taking metformin as insulin sensitizer  RECENT history:   [name] A1c is last 7.4 compared to 8.3,  Current diabetes management, blood sugar patterns from sensor and pump download:  Problems with management:  [name] was started on the T-Slim insulin pump in late October  Also [name] is back on Lyumjev insulin  [name] has had only one issue with the cannula being bent causing high sugars otherwise is doing well with the new pump  Has had only minimal hypoglycemia compared to the previous pump  Also [name] time in range has improved to 80% instead of 62%  [name] has had some tendency to HYPERGLYCEMIA periodically at all times with HIGHEST readings around 2 PM and 10 PM  Today after lunch [name] blood sugars are well over 200 despite trying to bolus adequately  However does not show any consistent pattern of hyperglycemia  HYPOGLYCEMIA has been occasional and mild occurring around 2 AM or 6 PM  Generally blood sugars are overall the lowest in the morning hours  [name] CGM shows the following  data:    CGM use % of time  91  Average and SD  143, was 167   Time in range     80, was 62%  % Time Above 180  19  % Time above 250    % Time Below target 1    PRE-MEAL  overnight  morning  afternoon  evening Overall  Glucose range:       Mean/median:  142  124  157  153      Hypoglycemic awareness: Has symptoms of feeling tired, headache, sweaty.  [name] thinks he can usually recognize symptoms  Sometimes may not be aware during the night and will be notified by [name] sensor that the sugar is low  [name] has symptoms when blood glucose is less than 60. Uses glucose tablets for treatment even overnight     DIET has been usually fairly controlled with carbohydrate and fat intake, tends to eat out on weekends The mealtimes are variable.    Food preferences: Yogurt and cottage cheese for breakfast usually.   Blood sugars with exercise: No significant problems recently with adequate control and no hypoglycemia  .  Wt Readings from Last 3 Encounters:  09/14/20 216 lb 12.8 oz (98.3 kg)  09/05/20 218 lb (98.9 kg)  07/25/20 214 lb (97.1 kg)    LABS:  Lab Results  Component Value Date  HGBA1C 7.4 (H) 07/19/2020   HGBA1C 8.3 (H) 04/18/2020   HGBA1C 7.2 (H) 01/11/2020   Lab Results  Component Value Date   MICROALBUR <0.7 04/18/2020   LDLCALC 40 07/19/2020   CREATININE 0.86 07/19/2020    No visits with results within 1 Week(s) from this visit.  Latest known visit with results is:  Office Visit on 09/05/2020  Component Date Value Ref Range Status  . Color, UA 09/05/2020 dark   Final  . Clarity, UA 09/05/2020 cloudy   Final  . Glucose, UA 09/05/2020 Negative  Negative Final  . Bilirubin, UA 09/05/2020 Negative   Final  . Ketones, UA 09/05/2020 Negative   Final  . Spec Grav, UA 09/05/2020 >=1.030* 1.010 - 1.025 Final  . Blood, UA 09/05/2020 Positive   Final  . pH, UA 09/05/2020 6.0  5.0 - 8.0 Final  . Protein, UA 09/05/2020 Negative  Negative Final  . Urobilinogen, UA  09/05/2020 0.2  0.2 or 1.0 E.U./dL Final  . Nitrite, UA 09/05/2020 Negative   Final  . Leukocytes, UA 09/05/2020 Negative  Negative Final     Allergies as of 09/14/2020      Reactions   Sulfa Antibiotics Rash      Medication List       Accurate as of September 14, 2020  2:24 PM. If you have any questions, ask your nurse or doctor.        aspirin 325 MG EC tablet Take 325 mg by mouth daily.   CALCIUM + D PO Take 600 mg by mouth 2 (two) times daily.   cholecalciferol 1000 units tablet Commonly known as: VITAMIN D Take 1,000 Units by mouth 2 (two) times daily.   clopidogrel 75 MG tablet Commonly known as: PLAVIX TAKE 1 TABLET DAILY   Dexcom G6 Sensor Misc by Does not apply route.   ezetimibe 10 MG tablet Commonly known as: ZETIA TAKE 1 TABLET DAILY   furosemide 20 MG tablet Commonly known as: LASIX Take 20 mg by mouth. Take 1 tablet by mouth as needed for 5 days.   glucose blood test strip Commonly known as: Estate manager/land agent Use as instructed to check sugar 6 times daily. Dx Code E10.65   insulin pump Soln Inject into the skin. Novolog Vial, max 200 units per day with pump   losartan 25 MG tablet Commonly known as: COZAAR TAKE 1 TABLET DAILY   Lyumjev 100 UNIT/ML Soln Generic drug: Insulin Lispro-aabc Use max of 90 units daily via insulin pump.   metFORMIN 500 MG 24 hr tablet Commonly known as: GLUCOPHAGE-XR TAKE 1 TABLET IN THE MORNING AND 2 TABLETS IN THE EVENING AS DIRECTED   metoprolol succinate 100 MG 24 hr tablet Commonly known as: TOPROL-XL TAKE 1 TABLET DAILY   niacin 1000 MG CR tablet Commonly known as: NIASPAN Take 1 tablet (1,000 mg total) by mouth at bedtime.   nitrofurantoin (macrocrystal-monohydrate) 100 MG capsule Commonly known as: Macrobid Take 1 capsule (100 mg total) by mouth 2 (two) times daily.   nitroGLYCERIN 0.4 MG SL tablet Commonly known as: Nitrostat Place 1 tablet (0.4 mg total) under the tongue every 5 (five)  minutes as needed for chest pain.   pioglitazone 15 MG tablet Commonly known as: ACTOS Take 1 tablet (15 mg total) by mouth daily.   potassium chloride 10 MEQ tablet Commonly known as: KLOR-CON Take 1 tablet (10 mEq total) by mouth daily as needed (take with Lasix).   rosuvastatin 40 MG tablet Commonly known as:  CRESTOR TAKE 1 TABLET DAILY       Allergies:  Allergies  Allergen Reactions  . Sulfa Antibiotics Rash    Past Medical History:  Diagnosis Date  . Arthritis    OA  . Atrial flutter ([place])    a. s/p TEE/DCCV 08/16/13 (normal LV function, no LAA thrombus).b. s/p ablation by Dr Lovena Le 09-23-2013  . Coronary artery disease    2009 LAD Promus stent  . Dysrhythmia    Aflutter, no issues after ablation 2014  . Fibroid   . Hyperlipidemia   . Insulin pump in place   . Osteopenia   . Type 1 diabetes mellitus on insulin therapy ([place])   . Valvular heart disease    a. Mild MR/TR by TEE 08/2013.    Past Surgical History:  Procedure Laterality Date  . ABLATION  09-23-2013   CTI by Dr Lovena Le  . APPENDECTOMY    . APPLICATION OF WOUND VAC Left 04/11/2017   Procedure: APPLICATION OF WOUND VAC LEFT KNEE;  Surgeon: Rod Can, MD;  Location: [place];  Service: Orthopedics;  Laterality: Left;  . ATRIAL FLUTTER ABLATION N/A 09/23/2013   Procedure: ATRIAL FLUTTER ABLATION;  Surgeon: Evans Lance, MD;  Location: [facility] CATH LAB;  Service: Cardiovascular;  Laterality: N/A;  . CARDIOVERSION N/A 08/16/2013   Procedure: CARDIOVERSION;  Surgeon: Thayer Headings, MD;  Location: [place];  Service: Cardiovascular;  Laterality: N/A;  spoke with [name]  . CATARACT EXTRACTION Bilateral   . COLONOSCOPY    . CORONARY ANGIOPLASTY WITH STENT PLACEMENT    . MYOMECTOMY  1977  . ORIF PATELLA Left 04/11/2017  . ORIF PATELLA Left 04/11/2017   Procedure: OPEN REDUCTION INTERNAL (ORIF) FIXATION PATELLA LEFT KNEE;  Surgeon: Rod Can, MD;  Location: [place];  Service: Orthopedics;  Laterality:  Left;  . PELVIC LAPAROSCOPY    . TEE WITHOUT CARDIOVERSION N/A 08/16/2013   Procedure: TRANSESOPHAGEAL ECHOCARDIOGRAM (TEE);  Surgeon: Thayer Headings, MD;  Location: [facility] ENDOSCOPY;  Service: Cardiovascular;  Laterality: N/A;    Family History  Problem Relation Age of Onset  . CAD Father 25       died age 65  . CAD Brother 51       small vessel disease  . Atrial fibrillation Mother   . Diabetes Mother   . Hypertension Mother   . CAD Cousin        paternal  . CAD Paternal Grandfather        early onset  . Colon cancer Neg Hx     Social History:  reports that [name] has never smoked. [name] has never used smokeless tobacco. [name] reports current alcohol use of about 5.0 standard drinks of alcohol per week. [name] reports that [name] does not use drugs.  REVIEW of systems:   Cardiac function: [name] echo shows only grade 1 diastolic dysfunction and normal systolic function  [name] has had atrial arrhythmias treated with metoprolol  Blood pressure usually normal, on 25 mg losartan from cardiologist for preventive reasons  Blood pressure was higher on first measurement today, does not monitor at home  BP Readings from Last 3 Encounters:  09/14/20 136/72  09/05/20 (!) 122/58  07/25/20 (!) 146/66     [name] has had diabetic dyslipidemia with significantly high LDL particle number and this has been well controlled with Crestor 40 mg and Niaspan 1000 mg [name] last LDL particle number was down to below 900  [name] is taking [name] niacin regularly with [name] Crestor This is  controlling HDL and improved particle number  Zetia added in 3/19  Labs as follows:   Lab Results  Component Value Date   CHOL 100 07/19/2020   HDL 47.60 07/19/2020   LDLCALC 40 07/19/2020   LDLDIRECT 79.0 01/11/2015   TRIG 63.0 07/19/2020   CHOLHDL 2 07/19/2020     [name] has had consistently normal thyroid levels  Lab Results  Component Value Date   TSH 2.23 10/06/2019   TSH 1.86 01/19/2019   TSH 1.684 08/15/2013    No  retinopathy reportedly on last exam in 11/20   BP 136/72   Pulse 76   Ht 5\' 9"  (1.753 m)   Wt 216 lb 12.8 oz (98.3 kg)   SpO2 97%   BMI 32.02 kg/m      ASSESSMENT/PLAN:  DIABETES type 1 on insulin pump  [name] A1c is last 7.4   See history of present illness for detailed discussion of [name] current blood sugar patterns and management with [name] pump  [name] insulin pump was downloaded, [name] usage information entered in our pump online website and patterns discussed with [name]  With using the T-insulin pump [name] blood sugars are significantly more frequently in the target range and has had only minimal hypoglycemia also No consistent pattern of high or low sugars seen currently but generally seems to be having high readings in the early afternoon with or without food intake Blood sugar may be low normal at 2 AM and 6 PM  Highest blood sugars are in the afternoon and occasionally overnight also as well as fasting recently  Recommendations:  Basal rate will be reduced at midnight down to 0.50, increased at 1 PM up to 1.60 and reduced at 5 PM down to 2.0 for now [name] will let us know if [name] has any difficulty programming the pump  Follow-up in 2 months   There are no [name] Instructions on file for this visit.   Elayne Snare 09/14/20   Note: This office note was prepared with Dragon voice recognition system technology. Any transcriptional errors that result from this process are unintentional.

## 2020-09-15 ENCOUNTER — Encounter: Payer: Self-pay | Admitting: Endocrinology

## 2020-10-17 ENCOUNTER — Other Ambulatory Visit: Payer: Medicare Other

## 2020-10-24 ENCOUNTER — Ambulatory Visit: Payer: Medicare Other | Admitting: Endocrinology

## 2020-10-25 ENCOUNTER — Telehealth: Payer: Self-pay | Admitting: Internal Medicine

## 2020-10-25 NOTE — Progress Notes (Signed)
°  Chronic Care Management   Outreach Note  10/25/2020 Name: [name] MRN: [id number] DOB: November 12, 1945  Referred by: Hoyt Koch, MD Reason for referral : No chief complaint on file.   An unsuccessful telephone outreach was attempted today. The patient was referred to the pharmacist for assistance with care management and care coordination.   Follow Up Plan:   Carley Perdue [employer] Scheduler

## 2020-10-30 ENCOUNTER — Telehealth: Payer: Self-pay | Admitting: Internal Medicine

## 2020-10-30 NOTE — Progress Notes (Signed)
  Chronic Care Management   Note  10/30/2020 Name: [name] MRN: [id number] DOB: [birth year]  [name] is a 74 y.o. year old female who is a primary care patient of Hoyt Koch, MD. I reached out to Marianna Payment by phone today in response to a referral sent by [name]'s PCP, Hoyt Koch, MD.   [name] was given information about Chronic Care Management services today including:  1. CCM service includes personalized support from designated clinical staff supervised by her physician, including individualized plan of care and coordination with other care providers 2. 24/7 contact phone numbers for assistance for urgent and routine care needs. 3. Service will only be billed when office clinical staff spend 20 minutes or more in a month to coordinate care. 4. Only one practitioner may furnish and bill the service in a calendar month. 5. The patient may stop CCM services at any time (effective at the end of the month) by phone call to the office staff.   Patient agreed to services and verbal consent obtained.   Follow up plan:   Carley Perdue [employer] Scheduler

## 2020-11-23 ENCOUNTER — Other Ambulatory Visit: Payer: Self-pay

## 2020-11-26 ENCOUNTER — Encounter: Payer: Self-pay | Admitting: Cardiovascular Disease

## 2020-11-26 NOTE — Progress Notes (Signed)
[name]  [birth year] [name]          [address]    [place]     [place], [place]  [address]           Problems. 1. CAD 2. Atrial Flutter - s/p Aflutter ablation Nov. Kelley.  3. Diabetes Mellitus.    [name] is doing very well. She's not having episodes of chest pain or shortness of breath. She's been able to below of her normal activities without any significant problems. She has not had chest pains .  Her insurance will run out the month before she goes on Medicare.   She has not had any CP and no palpitations.    [name]:   [name] is doing well. No CP.  Rhythm is stable.     [name]:  [name] is doing well.  She had a atrial flutter ablation several months ago.  She has not been taking her metoprolol..  [name]:  [name] is doing well. No Cp , no dyspnea.  Active, not exerciseing as much as she would like.  [name]:   [name] is doing well.   Doing well from a cardiac standpoint.   [name]  Lots of stress - her 4 yo mom is living with her.  Will not let anyone else do anything Lots of stress No CP , no arrhythmias No time to exercise   [name]:  Doing well Her mother is living back in [place].   Stress is a bit less.  Exercising some .   [name].  Doing well . Had afib ablation several years ago and feels great.  DM is well controlled.  Has UTIs frequently .   [name]: Seen back today for follow-up of her coronary artery disease. Doing well Falls occasionally ,  Broke her left kneecap and left ankle this past year.  Takes the niacin at night and the ASA 81 mg does not conrol the niacin flushng like the 325 mg niacin does.   [name]  [name] is seen today . No CP or dyspnea  Exercising some.   Diabetes is well controlled.   [name]  [name] is seen toda for follow up of her  CAD, atrial flutter, DM, hyperlipidemia Doing well.    VS look  great Exercising regularly .  No CP or dyspnea.  Is on ASA 325 for the effects of niacin at bedtime   [name]    [name] is seen today for follow up of her CAD, atrial flutter, DM, HLD Her daughter and family have moved back to [facility]  No cp, no dyspnea,   No palpitations suggestive of atrial flutter.   [name]:  [name] is seen today for follow up of her CAD, atrial flutter, DM, HLD Doing well.  No cardiac issues  Diabetes is doing well. Has a new insulin pump  HbA1C looks good    Current Outpatient Medications on File Prior to Visit  Medication Sig Dispense Refill  . aspirin 325 MG EC tablet Take 325 mg by mouth daily.    . Calcium Carbonate-Vitamin D (CALCIUM + D PO) Take 600 mg by mouth 2 (two) times daily.    . cholecalciferol (VITAMIN D) 1000 units tablet Take 1,000 Units by mouth 2 (two) times daily.     . clopidogrel (PLAVIX) 75 MG tablet  TAKE 1 TABLET DAILY 90 tablet 3  . Continuous Blood Gluc Sensor (DEXCOM G6 SENSOR) MISC by Does not apply route.    . ezetimibe (ZETIA) 10 MG tablet TAKE 1 TABLET DAILY 90 tablet 3  . glucose blood (BAYER CONTOUR TEST) test strip Use as instructed to check sugar 6 times daily. Dx Code E10.65 550 each 3  . Insulin Lispro-aabc (LYUMJEV) 100 UNIT/ML SOLN Use max of 90 units daily via insulin pump. 80 mL 3  . losartan (COZAAR) 25 MG tablet TAKE 1 TABLET DAILY 90 tablet 3  . metFORMIN (GLUCOPHAGE-XR) 500 MG 24 hr tablet TAKE 1 TABLET IN THE MORNING AND 2 TABLETS IN THE EVENING AS DIRECTED 270 tablet 3  . metoprolol succinate (TOPROL-XL) 100 MG 24 hr tablet TAKE 1 TABLET DAILY 90 tablet 3  . niacin (NIASPAN) 1000 MG CR tablet Take 1 tablet (1,000 mg total) by mouth at bedtime. 90 tablet 1  . nitroGLYCERIN (NITROSTAT) 0.4 MG SL tablet Place 1 tablet (0.4 mg total) under the tongue every 5 (five) minutes as needed for chest pain. 75 tablet 2  . pioglitazone (ACTOS) 15 MG tablet Take 1 tablet (15 mg total) by mouth daily. 90 tablet 3   . potassium chloride (K-DUR) 10 MEQ tablet Take 1 tablet (10 mEq total) by mouth daily as needed (take with Lasix). 90 tablet 3  . rosuvastatin (CRESTOR) 40 MG tablet TAKE 1 TABLET DAILY 90 tablet 3  . furosemide (LASIX) 20 MG tablet Take 20 mg by mouth. Take 1 tablet by mouth as needed for 5 days. (Patient not taking: Reported on 1/24/Kelley)     No current facility-administered medications on file prior to visit.    Allergies  Allergen Reactions  . Sulfa Antibiotics Rash    Past Medical History:  Diagnosis Date  . Arthritis    OA  . Atrial flutter ([place])    a. s/p TEE/DCCV 08/16/13 (normal LV function, no LAA thrombus).b. s/p ablation by Dr Lovena Le 11-20-Kelley  . Coronary artery disease    2009 LAD Promus stent  . Dysrhythmia    Aflutter, no issues after ablation Kelley  . Fibroid   . Hyperlipidemia   . Insulin pump in place   . Osteopenia   . Type 1 diabetes mellitus on insulin therapy ([place])   . Valvular heart disease    a. Mild MR/TR by TEE 10/Kelley.    Past Surgical History:  Procedure Laterality Date  . ABLATION  11-20-Kelley   CTI by Dr Lovena Le  . APPENDECTOMY    . APPLICATION OF WOUND VAC Left 6/8/Kelley   Procedure: APPLICATION OF WOUND VAC LEFT KNEE;  Surgeon: Rod Can, MD;  Location: [facility];  Service: Orthopedics;  Laterality: Left;  . ATRIAL FLUTTER ABLATION N/A 11/20/Kelley   Procedure: ATRIAL FLUTTER ABLATION;  Surgeon: Evans Lance, MD;  Location: [facility] CATH LAB;  Service: Cardiovascular;  Laterality: N/A;  . CARDIOVERSION N/A 10/13/Kelley   Procedure: CARDIOVERSION;  Surgeon: Thayer Headings, MD;  Location: [facility];  Service: Cardiovascular;  Laterality: N/A;  spoke with [name]  . CATARACT EXTRACTION Bilateral   . COLONOSCOPY    . CORONARY ANGIOPLASTY WITH STENT PLACEMENT    . MYOMECTOMY  1977  . ORIF PATELLA Left 06/08/Kelley  . ORIF PATELLA Left 6/8/Kelley   Procedure: OPEN REDUCTION INTERNAL (ORIF) FIXATION PATELLA LEFT KNEE;  Surgeon: Rod Can, MD;   Location: [facility];  Service: Orthopedics;  Laterality: Left;  . PELVIC LAPAROSCOPY    . TEE WITHOUT  CARDIOVERSION N/A 10/13/Kelley   Procedure: TRANSESOPHAGEAL ECHOCARDIOGRAM (TEE);  Surgeon: Vesta Mixer, MD;  Location: [facility] ENDOSCOPY;  Service: Cardiovascular;  Laterality: N/A;    Social History   Tobacco Use  Smoking Status Never Smoker  Smokeless Tobacco Never Used    Social History   Substance and Sexual Activity  Alcohol Use Yes  . Alcohol/week: 5.0 standard drinks  . Types: 5 Glasses of wine per week   Comment: on weekends    Family History  Problem Relation Age of Onset  . CAD Father 41       died age 61  . CAD Brother 30       small vessel disease  . Atrial fibrillation Mother   . Diabetes Mother   . Hypertension Mother   . CAD Cousin        paternal  . CAD Paternal Grandfather        early onset  . Colon cancer Neg Hx     Reviw of Systems:  Reviewed in the HPI.  All other systems are negative.  Physical Exam: Blood pressure 122/76, pulse 62, height 5\' 9"  (1.753 m), weight 218 lb 12.8 oz (99.2 kg), SpO2 97 %.  GEN:  Well nourished, well developed in no acute distress HEENT: Normal NECK: No JVD; No carotid bruits LYMPHATICS: No lymphadenopathy CARDIAC: RRR , no murmurs, rubs, gallops RESPIRATORY:  Clear to auscultation without rales, wheezing or rhonchi  ABDOMEN: Soft, non-tender, non-distended MUSCULOSKELETAL:  No edema; No deformity  SKIN: Warm and dry NEUROLOGIC:  Alert and oriented x 3  ECG:    Assessment / Plan:   1. CAD -  Not having any angina .  Cont meds   2. Atrial Flutter - .is maintaining NSR   3. Diabetes Mellitus.      4. Hperlipidemia:   Lipid levels have been well controlled.  She really does not tolerate the niacin.  She in fact is not taking it on any regular basis.  We will go ahead and stop the niacin.  We will also go ahead and stop her aspirin since she is on Plavix. She will discuss these changes with Dr. .  If her  lipids remain well controlled then we can continue with high-dose rosuvastatin.  If her LDL increases, we should consider one of the newer PCSK9 inhibitors.  5. Weight gain:   We had a long discussion about diet and exercise She will work on these   Lucianne Muss, MD  1/24/Kelley 11:18 AM    [facility] [place] [address] [place],  [address] [place], [place]  [place] Pager [phone] Phone: [phone]; Fax: [phone]

## 2020-11-27 ENCOUNTER — Other Ambulatory Visit (INDEPENDENT_AMBULATORY_CARE_PROVIDER_SITE_OTHER): Payer: Medicare Other

## 2020-11-27 ENCOUNTER — Ambulatory Visit (INDEPENDENT_AMBULATORY_CARE_PROVIDER_SITE_OTHER): Payer: Medicare Other | Admitting: Cardiovascular Disease

## 2020-11-27 ENCOUNTER — Encounter: Payer: Self-pay | Admitting: Cardiovascular Disease

## 2020-11-27 ENCOUNTER — Other Ambulatory Visit: Payer: Self-pay

## 2020-11-27 VITALS — BP 122/76 | HR 62 | Ht 69.0 in | Wt 218.8 lb

## 2020-11-27 DIAGNOSIS — E1065 Type 1 diabetes mellitus with hyperglycemia: Secondary | ICD-10-CM

## 2020-11-27 DIAGNOSIS — E1169 Type 2 diabetes mellitus with other specified complication: Secondary | ICD-10-CM

## 2020-11-27 DIAGNOSIS — I483 Typical atrial flutter: Secondary | ICD-10-CM | POA: Diagnosis not present

## 2020-11-27 DIAGNOSIS — E785 Hyperlipidemia, unspecified: Secondary | ICD-10-CM | POA: Diagnosis not present

## 2020-11-27 DIAGNOSIS — I2510 Atherosclerotic heart disease of native coronary artery without angina pectoris: Secondary | ICD-10-CM

## 2020-11-27 LAB — BASIC METABOLIC PANEL
BUN: 18 mg/dL (ref 6–23)
CO2: 28 mEq/L (ref 19–32)
Calcium: 9.3 mg/dL (ref 8.4–10.5)
Chloride: 101 mEq/L (ref 96–112)
Creatinine, Ser: 0.84 mg/dL (ref 0.40–1.20)
GFR: 69.17 mL/min (ref >60.00)
Glucose, Bld: 150 mg/dL — ABNORMAL HIGH (ref 70–99)
Potassium: 4.2 mEq/L (ref 3.5–5.1)
Sodium: 136 mEq/L (ref 135–145)

## 2020-11-27 LAB — HEMOGLOBIN A1C: Hgb A1c MFr Bld: 7.2 % — ABNORMAL HIGH (ref 4.6–6.5)

## 2020-11-27 MED ORDER — FUROSEMIDE 20 MG PO TABS: 20.0000 mg | ORAL_TABLET | ORAL | 11 refills | Status: DC | PRN

## 2020-11-27 NOTE — Patient Instructions (Addendum)
Medication Instructions:  Your physician has recommended you make the following change in your medication:  1) STOP taking aspirin and niacin *If you need a refill on your cardiac medications before your next appointment, please call your pharmacy*  Follow-Up: At [facility] you and your health needs are our priority.  As part of our continuing mission to provide you with exceptional heart care, we have created designated Provider Care Teams.  These Care Teams include your primary Cardiologist (physician) and Advanced Practice Providers (APPs -  Physician Assistants and Nurse Practitioners) who all work together to provide you with the care you need, when you need it.  We recommend signing up for the patient portal called "MyChart".  Sign up information is provided on this After Visit Summary.  MyChart is used to connect with patients for Virtual Visits (Telemedicine).  Patients are able to view lab/test results, encounter notes, upcoming appointments, etc.  Non-urgent messages can be sent to your provider as well.   To learn more about what you can do with MyChart, go to [email or web address].    Your next appointment:   1 year(s)  The format for your next appointment:   In Person  Provider:   You may see Mertie Moores, MD or one of the following Advanced Practice Providers on your designated Care Team:    Richardson Dopp, PA-C  [place], [place]

## 2020-11-27 NOTE — Addendum Note (Signed)
Addended by: Antonieta Iba on: 11/27/2020 03:13 PM   Modules accepted: Orders

## 2020-11-27 NOTE — Addendum Note (Signed)
Addended by: Antonieta Iba on: 11/27/2020 02:02 PM   Modules accepted: Orders

## 2020-11-30 ENCOUNTER — Other Ambulatory Visit: Payer: Self-pay

## 2020-11-30 ENCOUNTER — Ambulatory Visit (INDEPENDENT_AMBULATORY_CARE_PROVIDER_SITE_OTHER): Payer: Medicare Other | Admitting: Endocrinology

## 2020-11-30 ENCOUNTER — Encounter: Payer: Self-pay | Admitting: Endocrinology

## 2020-11-30 VITALS — BP 124/74 | HR 74 | Ht 69.0 in | Wt 219.4 lb

## 2020-11-30 DIAGNOSIS — E785 Hyperlipidemia, unspecified: Secondary | ICD-10-CM | POA: Diagnosis not present

## 2020-11-30 DIAGNOSIS — E1169 Type 2 diabetes mellitus with other specified complication: Secondary | ICD-10-CM

## 2020-11-30 DIAGNOSIS — E1069 Type 1 diabetes mellitus with other specified complication: Secondary | ICD-10-CM | POA: Diagnosis not present

## 2020-11-30 DIAGNOSIS — E1065 Type 1 diabetes mellitus with hyperglycemia: Secondary | ICD-10-CM | POA: Diagnosis not present

## 2020-11-30 DIAGNOSIS — I2510 Atherosclerotic heart disease of native coronary artery without angina pectoris: Secondary | ICD-10-CM

## 2020-11-30 NOTE — Progress Notes (Signed)
Patient ID: [name], female   DOB: [birth year], 75 y.o.   MRN: [id number]   Reason for visit: DIABETES followup.  Diagnosis: Type 1 diabetes mellitus, date of diagnosis: 1979.    HISTORY of present illness:   Insulin Pump: CURRENT brand: T-Slim since 08/2020  BASAL settings:  Midnight = 0.55, 3:30 AM = 1.2, 6 AM = 2.25, 10 AM = 0.75,  2 PM = 1.25, 5 PM = 2.1, 7:30 PM = 2.1, 8 PM = 3.2 and 10 PM = 1.8.    Total daily insulin = usually about 65 units, maximum 90, using Lyumjev   Carbohydrate ratio 1: 12 breakfast, 1: 5 lunch and 1: 4.5 at dinner; sensitivity MN= 1:30; 1: 25 from 5 AM-7 AM, 7 AM-12 AM = 35; target 120-130  Insulin on board indicator on, duration 4 hrs   DIABETES: She has had long-standing diabetes with A1c levels usually above target. In 10/13 pioglitazone was restarted because of her large insulin requirement and diabetic dyslipidemia.  Her blood sugars improved significantly with this.  Also has been taking metformin as insulin sensitizer  RECENT history:   Her A1c is further improved at 7.2, previously 7.4  Current diabetes management, blood sugar patterns from sensor and pump download:  Problems with management:  She has had generally stable blood sugars with some periods of hyperglycemia as discussed in the CGM download information  Because of a few days of significant hyperglycemia her time in range is not as good as the last visit for the last 2 weeks  Has had only minimal hypoglycemia as before  For some reason she is turning on the exercise mode without realizing it and not clear if this is active only overnight or all the time The current auto correction boluses do not appear to be getting her blood sugar down to target She is not utilizing the sleep mode at night   Generally blood sugars are higher overnight and late evening with average just above 180 up to about 210 but not consistent  Blood sugars may be the lowest before  dinnertime between about 5-7 PM with occasional low normal readings but not hypoglycemia  As above hypoglycemia minimal and only transient, once related to lunch bolus  Postprandial readings are occasionally higher especially after dinner when she may not be getting adequate bolus coverage, occasionally will include additional carbohydrate to get correction bolus override  Blood sugars before breakfast and lunch are usually on an average fairly good and low normal at dinnertime especially if she is late for eating  Overall overnight blood sugars are mostly a little high except on 1/24  Her CGM shows the following data:    CGM use % of time  98  2-week average/SD  162  Time in range       75%  % Time Above 180  25  % Time above 250   % Time Below 70  0      PRE-MEAL  overnight  mornings  afternoon  evening Overall  Glucose range:      60-400  Averages:  185  145  147  173  162   Previous data:  CGM use % of time  91  Average and SD  143, was 167   Time in range     80, was 62%  % Time Above 180  19  % Time above 250    % Time Below target 1    PRE-MEAL  overnight  morning  afternoon  evening Overall  Glucose range:       Mean/median:  142  124  157  153      Hypoglycemic awareness: Has symptoms of feeling tired, headache, sweaty.  She thinks he can usually recognize symptoms  Sometimes may not be aware during the night and will be notified by her sensor that the sugar is low  She has symptoms when blood glucose is less than 60. Uses glucose tablets for treatment even overnight     DIET has been usually fairly controlled with carbohydrate and fat intake, tends to eat out on weekends The mealtimes are variable.    Food preferences: Yogurt and cottage cheese for breakfast usually.   Blood sugars with exercise: No significant problems recently with adequate control and no hypoglycemia  .  Wt Readings from Last 3 Encounters:  11/30/20 219 lb 6.4 oz (99.5 kg)   11/27/20 218 lb 12.8 oz (99.2 kg)  09/14/20 216 lb 12.8 oz (98.3 kg)    LABS:  Lab Results  Component Value Date   HGBA1C 7.2 (H) 11/27/2020   HGBA1C 7.4 (H) 07/19/2020   HGBA1C 8.3 (H) 04/18/2020   Lab Results  Component Value Date   MICROALBUR <0.7 04/18/2020   LDLCALC 40 07/19/2020   CREATININE 0.84 11/27/2020    Lab on 11/27/2020  Component Date Value Ref Range Status  . Sodium 11/27/2020 136  135 - 145 mEq/L Final  . Potassium 11/27/2020 4.2  3.5 - 5.1 mEq/L Final  . Chloride 11/27/2020 101  96 - 112 mEq/L Final  . CO2 11/27/2020 28  19 - 32 mEq/L Final  . Glucose, Bld 11/27/2020 150* 70 - 99 mg/dL Final  . BUN 11/27/2020 18  6 - 23 mg/dL Final  . Creatinine, Ser 11/27/2020 0.84  0.40 - 1.20 mg/dL Final  . GFR 11/27/2020 69.17  >60.00 mL/min Final   Calculated using the CKD-EPI Creatinine Equation (2021)  . Calcium 11/27/2020 9.3  8.4 - 10.5 mg/dL Final  . Hgb A1c MFr Bld 11/27/2020 7.2* 4.6 - 6.5 % Final   Glycemic Control Guidelines for People with Diabetes:Non Diabetic:  <6%Goal of Therapy: <7%Additional Action Suggested:  >8%      Allergies as of 11/30/2020      Reactions   Sulfa Antibiotics Rash      Medication List       Accurate as of November 30, 2020 11:08 AM. If you have any questions, ask your nurse or doctor.        CALCIUM + D PO Take 600 mg by mouth 2 (two) times daily.   cholecalciferol 1000 units tablet Commonly known as: VITAMIN D Take 1,000 Units by mouth 2 (two) times daily.   clopidogrel 75 MG tablet Commonly known as: PLAVIX TAKE 1 TABLET DAILY   Dexcom G6 Sensor Misc by Does not apply route.   ezetimibe 10 MG tablet Commonly known as: ZETIA TAKE 1 TABLET DAILY   furosemide 20 MG tablet Commonly known as: LASIX Take 1 tablet (20 mg total) by mouth as needed. Take 1 tablet by mouth as needed for 5 days.   glucose blood test strip Commonly known as: Estate manager/land agent Use as instructed to check sugar 6 times daily. Dx  Code E10.65   losartan 25 MG tablet Commonly known as: COZAAR TAKE 1 TABLET DAILY   Lyumjev 100 UNIT/ML Soln Generic drug: Insulin Lispro-aabc Use max of 90 units daily via insulin pump. What changed: additional instructions  metFORMIN 500 MG 24 hr tablet Commonly known as: GLUCOPHAGE-XR TAKE 1 TABLET IN THE MORNING AND 2 TABLETS IN THE EVENING AS DIRECTED   metoprolol succinate 100 MG 24 hr tablet Commonly known as: TOPROL-XL TAKE 1 TABLET DAILY   niacin 100 MG tablet Take 100 mg by mouth at bedtime.   nitroGLYCERIN 0.4 MG SL tablet Commonly known as: Nitrostat Place 1 tablet (0.4 mg total) under the tongue every 5 (five) minutes as needed for chest pain.   pioglitazone 15 MG tablet Commonly known as: ACTOS Take 1 tablet (15 mg total) by mouth daily.   potassium chloride 10 MEQ tablet Commonly known as: KLOR-CON Take 1 tablet (10 mEq total) by mouth daily as needed (take with Lasix).   rosuvastatin 40 MG tablet Commonly known as: CRESTOR TAKE 1 TABLET DAILY       Allergies:  Allergies  Allergen Reactions  . Sulfa Antibiotics Rash    Past Medical History:  Diagnosis Date  . Arthritis    OA  . Atrial flutter ([facility])    a. s/p TEE/DCCV 08/16/13 (normal LV function, no LAA thrombus).b. s/p ablation by Dr Ladona Ridgel 09-23-2013  . Coronary artery disease    2009 LAD Promus stent  . Dysrhythmia    Aflutter, no issues after ablation 2014  . Fibroid   . Hyperlipidemia   . Insulin pump in place   . Osteopenia   . Type 1 diabetes mellitus on insulin therapy ([facility])   . Valvular heart disease    a. Mild MR/TR by TEE 08/2013.    Past Surgical History:  Procedure Laterality Date  . ABLATION  09-23-2013   CTI by Dr Ladona Ridgel  . APPENDECTOMY    . APPLICATION OF WOUND VAC Left 04/11/2017   Procedure: APPLICATION OF WOUND VAC LEFT KNEE;  Surgeon: Samson Frederic, MD;  Location: [facility] OR;  Service: Orthopedics;  Laterality: Left;  . ATRIAL FLUTTER ABLATION N/A 09/23/2013    Procedure: ATRIAL FLUTTER ABLATION;  Surgeon: Marinus Maw, MD;  Location: [facility] CATH LAB;  Service: Cardiovascular;  Laterality: N/A;  . CARDIOVERSION N/A 08/16/2013   Procedure: CARDIOVERSION;  Surgeon: Vesta Mixer, MD;  Location: [facility] ENDOSCOPY;  Service: Cardiovascular;  Laterality: N/A;  spoke with Elijah Birk  . CATARACT EXTRACTION Bilateral   . COLONOSCOPY    . CORONARY ANGIOPLASTY WITH STENT PLACEMENT    . MYOMECTOMY  1977  . ORIF PATELLA Left 04/11/2017  . ORIF PATELLA Left 04/11/2017   Procedure: OPEN REDUCTION INTERNAL (ORIF) FIXATION PATELLA LEFT KNEE;  Surgeon: Samson Frederic, MD;  Location: [facility] OR;  Service: Orthopedics;  Laterality: Left;  . PELVIC LAPAROSCOPY    . TEE WITHOUT CARDIOVERSION N/A 08/16/2013   Procedure: TRANSESOPHAGEAL ECHOCARDIOGRAM (TEE);  Surgeon: Vesta Mixer, MD;  Location[facility] ENDOSCOPY;  Service: Cardiovascular;  Laterality: N/A;    Family History  Problem Relation Age of Onset  . CAD Father 63       died age 58  . CAD Brother 30       small vessel disease  . Atrial fibrillation Mother   . Diabetes Mother   . Hypertension Mother   . CAD Cousin        paternal  . CAD Paternal Grandfather        early onset  . Colon cancer Neg Hx     Social History:  reports that she has never smoked. She has never used smokeless tobacco. She reports current alcohol use of about 5.0 standard drinks of alcohol  per week. She reports that she does not use drugs.  REVIEW of systems:   Cardiac function: Her echo shows only grade 1 diastolic dysfunction and normal systolic function  She has had atrial arrhythmias treated with metoprolol  Blood pressure usually normal, on 25 mg losartan from cardiologist for preventive reasons  Blood pressure was higher on first measurement today, does not monitor at home  BP Readings from Last 3 Encounters:  11/30/20 124/74  11/27/20 122/76  09/14/20 136/72     She has had diabetic dyslipidemia with significantly high LDL  particle number and this has been well controlled with Crestor 40 mg and Niaspan 1000 mg Her last LDL particle number was down to below 900  She is taking her niacin irregularly now because of tending to get flushing and itching which may be late at night, not generally prevented by aspirin, also she finds the regimen difficult to do at night She thinks she has been irregular for quite some time; however her HDL has not been decreasing recently  She has been regular with her Crestor This is controlling HDL and improved particle number  Zetia added in 3/19  Labs as follows:  Lab Results  Component Value Date   CHOL 100 07/19/2020   CHOL 126 01/11/2020   CHOL 110 07/26/2019   Lab Results  Component Value Date   HDL 47.60 07/19/2020   HDL 47.00 01/11/2020   HDL 54.80 07/26/2019   Lab Results  Component Value Date   LDLCALC 40 07/19/2020   LDLCALC 59 01/11/2020   LDLCALC 44 07/26/2019   Lab Results  Component Value Date   TRIG 63.0 07/19/2020   TRIG 100.0 01/11/2020   TRIG 53.0 07/26/2019   Lab Results  Component Value Date   CHOLHDL 2 07/19/2020   CHOLHDL 3 01/11/2020   CHOLHDL 2 07/26/2019   Lab Results  Component Value Date   LDLDIRECT 79.0 01/11/2015   LDLDIRECT 73.1 10/13/2014      She has had consistently normal thyroid levels  Lab Results  Component Value Date   TSH 2.23 10/06/2019   TSH 1.86 01/19/2019   TSH 1.684 08/15/2013    No retinopathy reportedly on last exam in 11/20   BP 124/74   Pulse 74   Ht 5\' 9"  (1.753 m)   Wt 219 lb 6.4 oz (99.5 kg)   SpO2 98%   BMI 32.40 kg/m      ASSESSMENT/PLAN:  DIABETES type 1 on T-slim insulin pump  Her A1c is now 7.2   See history of present illness for detailed discussion of her current blood sugar patterns and management with her pump  Her insulin pump download and sensor data was reviewed as above Blood sugars are not consistently controlled but still over 70% within target range Some of  her high readings may be related to having the exercise mode turned on and this may be affecting her control overnight at least Also correction boluses do not appear to be adequate as also occasionally mealtime boluses  Recommendations:  Basal rate will be reduced at 3:30 PM down to 2.0 for now Correction factor will be 1: 30 from 7 AM-12 AM She will use the sleep mode at night but at least wait a couple of hours after dinner to turn it on Exercise more when she is doing more vigorous exercise May take extra correction boluses with persistently high readings Program extra boluses for higher fat meals  Hyperlipidemia: She can niacin since she cannot  tolerate it and likely has not taken enough anyway We will recheck LDL particle number on the next visit  Follow-up in 3 months   There are no Patient Instructions on file for this visit.   Elayne Snare 11/30/20   Note: This office note was prepared with Dragon voice recognition system technology. Any transcriptional errors that result from this process are unintentional.

## 2020-12-14 ENCOUNTER — Other Ambulatory Visit: Payer: Self-pay

## 2020-12-14 ENCOUNTER — Encounter: Payer: Self-pay | Admitting: Podiatry

## 2020-12-14 ENCOUNTER — Ambulatory Visit (INDEPENDENT_AMBULATORY_CARE_PROVIDER_SITE_OTHER): Payer: Medicare Other | Admitting: Podiatry

## 2020-12-14 DIAGNOSIS — M779 Enthesopathy, unspecified: Secondary | ICD-10-CM | POA: Diagnosis not present

## 2020-12-14 DIAGNOSIS — I2510 Atherosclerotic heart disease of native coronary artery without angina pectoris: Secondary | ICD-10-CM

## 2020-12-14 DIAGNOSIS — M2041 Other hammer toe(s) (acquired), right foot: Secondary | ICD-10-CM | POA: Diagnosis not present

## 2020-12-14 MED ORDER — TRIAMCINOLONE ACETONIDE 10 MG/ML IJ SUSP
10.0000 mg | Freq: Once | INTRAMUSCULAR | Status: AC
  Administered 2020-12-14: 10 mg

## 2020-12-14 NOTE — Progress Notes (Signed)
Subjective:   Patient ID: [name], female   DOB: 75 y.o.   MRN: [id number]   HPI patient presents today with a painful fifth digit of my right foot and its been hard for me to wear shoe gear for the last couple months and I am not sure what I may have done.  States there is also a lesion that sore and she has long-term diabetes under good control   Review of Systems  All other systems reviewed and are negative.       Objective:  Physical Exam Vitals and nursing note reviewed.  Constitutional:      Appearance: She is well-developed and well-nourished.  Cardiovascular:     Pulses: Intact distal pulses.  Pulmonary:     Effort: Pulmonary effort is normal.  Musculoskeletal:        General: Normal range of motion.  Skin:    General: Skin is warm.  Neurological:     Mental Status: She is alert.     Neurovascular status intact muscle strength adequate range of motion adequate.  Patient's fifth digit right shows keratotic lesion with inflammation of the inner phalangeal joint and pain with palpation.  Patient has rotated fifth digit also noted and has good digital perfusion     Assessment:  Inflammatory capsulitis digit 5 right with hammertoe deformity and keratotic lesion     Plan:  H&P reviewed condition and explained hammertoe deformity lesion and inflammatory capsulitis inner phalangeal joint.  I went ahead today and I did a anesthesia block of the fifth toe and with sterile instrumentation I did an inner phalangeal joint injection 1 mg Dexasone Kenalog 3 mg Xylocaine and I debrided the lesion on the fifth digit and applied pad.  Instructed possibility for surgical intervention in future

## 2020-12-18 ENCOUNTER — Other Ambulatory Visit: Payer: Self-pay

## 2020-12-18 ENCOUNTER — Telehealth (INDEPENDENT_AMBULATORY_CARE_PROVIDER_SITE_OTHER): Payer: Medicare Other | Admitting: Internal Medicine

## 2020-12-18 DIAGNOSIS — I2510 Atherosclerotic heart disease of native coronary artery without angina pectoris: Secondary | ICD-10-CM | POA: Diagnosis not present

## 2020-12-18 DIAGNOSIS — N3000 Acute cystitis without hematuria: Secondary | ICD-10-CM

## 2020-12-18 MED ORDER — CEPHALEXIN 500 MG PO CAPS: 500.0000 mg | ORAL_CAPSULE | Freq: Two times a day (BID) | ORAL | 0 refills | Status: AC

## 2020-12-18 NOTE — Progress Notes (Signed)
Virtual Visit via Video Note  I connected with [name] on 12/18/20 at  2:40 PM EST by a video enabled telemedicine application and verified that I am speaking with the correct person using two identifiers.  The patient and the provider were at separate locations throughout the entire encounter. Patient location: home, Provider location: work   I discussed the limitations of evaluation and management by telemedicine and the availability of in person appointments. The patient expressed understanding and agreed to proceed. The patient and the provider were the only parties present for the visit unless noted in HPI below.  History of Present Illness: The patient is a 75 y.o. female with visit for possible UTI. She is currently exposed to covid-19 husband tested positive this weekend. She does not currently have symptoms and is vaccinated and boosted. She is having some burning with urination and frequency. Denies fevers or chills. Denies new back pain. No stomach pain other than when urinating. Started recently.  Observations/Objective: Appearance: normal, breathing appears normal, casual grooming, abdomen does not appear distended, throat not well visualized, mental status is A and O times 3  Assessment and Plan: See problem oriented charting  Follow Up Instructions: rx keflex 5 day course, if no improvement can do U/A and culture  I discussed the assessment and treatment plan with the patient. The patient was provided an opportunity to ask questions and all were answered. The patient agreed with the plan and demonstrated an understanding of the instructions.   The patient was advised to call back or seek an in-person evaluation if the symptoms worsen or if the condition fails to improve as anticipated.  Hoyt Koch, MD

## 2020-12-19 ENCOUNTER — Encounter: Payer: Self-pay | Admitting: Internal Medicine

## 2020-12-19 DIAGNOSIS — N3000 Acute cystitis without hematuria: Secondary | ICD-10-CM | POA: Insufficient documentation

## 2020-12-19 NOTE — Assessment & Plan Note (Signed)
Rx 5 day keflex course. If no improvement needs urine sample.

## 2020-12-21 ENCOUNTER — Telehealth (INDEPENDENT_AMBULATORY_CARE_PROVIDER_SITE_OTHER): Payer: Medicare Other | Admitting: Family Medicine

## 2020-12-21 DIAGNOSIS — U071 COVID-19: Secondary | ICD-10-CM

## 2020-12-21 DIAGNOSIS — I2510 Atherosclerotic heart disease of native coronary artery without angina pectoris: Secondary | ICD-10-CM

## 2020-12-21 MED ORDER — BENZONATATE 100 MG PO CAPS: 100.0000 mg | ORAL_CAPSULE | Freq: Three times a day (TID) | ORAL | 0 refills | Status: DC | PRN

## 2020-12-21 NOTE — Progress Notes (Signed)
Virtual Visit via Video Note  I connected with [name]  on 12/21/20 at  3:40 PM EST by a video enabled telemedicine application and verified that I am speaking with the correct person using two identifiers.  Location patient: home, [place] Location provider:work or home office Persons participating in the virtual visit: patient, provider  I discussed the limitations of evaluation and management by telemedicine and the availability of in person appointments. The patient expressed understanding and agreed to proceed.   HPI:  Acute telemedicine visit for COVID19 illness: -Onset:husabnd sick earlier this week; [name] got sick starting on 12/19/20 -Symptoms include: fever, laryngitis, lots of nasal congestion, cough, sore throat, diarrhea -positive test yesterday -felt a little better yesterday, fever better today -Denies: CP, SOB, vomiting, difficulty getting out or bed/eating or drinking -Pertinent past medical history: Obesity, HTN, CAD, Type 1 diabetes - on a pump; reports sugars run high when sick, BS 145 currently -Pertinent medication allergies: sulfa -COVID-19 vaccine status: fully vaccinated + booster (pfizer) in September; had flu shot this year as well  On keflex for UTI currently.   ROS: See pertinent positives and negatives per HPI.  Past Medical History:  Diagnosis Date  . Arthritis    OA  . Atrial flutter ([place])    a. s/p TEE/DCCV 08/16/13 (normal LV function, no LAA thrombus).b. s/p ablation by Dr Lovena Le 09-23-2013  . Coronary artery disease    2009 LAD Promus stent  . Dysrhythmia    Aflutter, no issues after ablation 2014  . Fibroid   . Hyperlipidemia   . Insulin pump in place   . Osteopenia   . Type 1 diabetes mellitus on insulin therapy ([place])   . Valvular heart disease    a. Mild MR/TR by TEE 08/2013.    Past Surgical History:  Procedure Laterality Date  . ABLATION  09-23-2013   CTI by Dr Lovena Le  . APPENDECTOMY    . APPLICATION OF WOUND VAC Left 04/11/2017    Procedure: APPLICATION OF WOUND VAC LEFT KNEE;  Surgeon: Rod Can, MD;  Location: [facility];  Service: Orthopedics;  Laterality: Left;  . ATRIAL FLUTTER ABLATION N/A 09/23/2013   Procedure: ATRIAL FLUTTER ABLATION;  Surgeon: Evans Lance, MD;  Location: [facility] CATH LAB;  Service: Cardiovascular;  Laterality: N/A;  . CARDIOVERSION N/A 08/16/2013   Procedure: CARDIOVERSION;  Surgeon: Thayer Headings, MD;  Location: [facility];  Service: Cardiovascular;  Laterality: N/A;  spoke with [name]  . CATARACT EXTRACTION Bilateral   . COLONOSCOPY    . CORONARY ANGIOPLASTY WITH STENT PLACEMENT    . MYOMECTOMY  1977  . ORIF PATELLA Left 04/11/2017  . ORIF PATELLA Left 04/11/2017   Procedure: OPEN REDUCTION INTERNAL (ORIF) FIXATION PATELLA LEFT KNEE;  Surgeon: Rod Can, MD;  Location: [facility];  Service: Orthopedics;  Laterality: Left;  . PELVIC LAPAROSCOPY    . TEE WITHOUT CARDIOVERSION N/A 08/16/2013   Procedure: TRANSESOPHAGEAL ECHOCARDIOGRAM (TEE);  Surgeon: Thayer Headings, MD;  Location: [facility];  Service: Cardiovascular;  Laterality: N/A;     Current Outpatient Medications:  .  Calcium Carbonate-Vitamin D (CALCIUM + D PO), Take 600 mg by mouth 2 (two) times daily., Disp: , Rfl:  .  cephALEXin (KEFLEX) 500 MG capsule, Take 1 capsule (500 mg total) by mouth 2 (two) times daily for 5 days., Disp: 10 capsule, Rfl: 0 .  cholecalciferol (VITAMIN D) 1000 units tablet, Take 1,000 Units by mouth 2 (two) times daily. , Disp: , Rfl:  .  clopidogrel (  PLAVIX) 75 MG tablet, TAKE 1 TABLET DAILY, Disp: 90 tablet, Rfl: 3 .  Continuous Blood Gluc Sensor (DEXCOM G6 SENSOR) MISC, by Does not apply route., Disp: , Rfl:  .  ezetimibe (ZETIA) 10 MG tablet, TAKE 1 TABLET DAILY, Disp: 90 tablet, Rfl: 3 .  furosemide (LASIX) 20 MG tablet, Take 1 tablet (20 mg total) by mouth as needed. Take 1 tablet by mouth as needed for 5 days., Disp: 30 tablet, Rfl: 11 .  Insulin Lispro-aabc (LYUMJEV) 100 UNIT/ML SOLN, Use max of  90 units daily via insulin pump. (Patient taking differently: Use max of 250 units daily via insulin pump.), Disp: 80 mL, Rfl: 3 .  losartan (COZAAR) 25 MG tablet, TAKE 1 TABLET DAILY, Disp: 90 tablet, Rfl: 3 .  metFORMIN (GLUCOPHAGE-XR) 500 MG 24 hr tablet, TAKE 1 TABLET IN THE MORNING AND 2 TABLETS IN THE EVENING AS DIRECTED, Disp: 270 tablet, Rfl: 3 .  metoprolol succinate (TOPROL-XL) 100 MG 24 hr tablet, TAKE 1 TABLET DAILY, Disp: 90 tablet, Rfl: 3 .  nitroGLYCERIN (NITROSTAT) 0.4 MG SL tablet, Place 1 tablet (0.4 mg total) under the tongue every 5 (five) minutes as needed for chest pain., Disp: 75 tablet, Rfl: 2 .  pioglitazone (ACTOS) 15 MG tablet, Take 1 tablet (15 mg total) by mouth daily., Disp: 90 tablet, Rfl: 3 .  potassium chloride (K-DUR) 10 MEQ tablet, Take 1 tablet (10 mEq total) by mouth daily as needed (take with Lasix). (Patient not taking: Reported on 11/30/2020), Disp: 90 tablet, Rfl: 3 .  rosuvastatin (CRESTOR) 40 MG tablet, TAKE 1 TABLET DAILY, Disp: 90 tablet, Rfl: 3  EXAM:  VITALS per patient if applicable:  GENERAL: alert, oriented, appears well and in no acute distress  HEENT: atraumatic, conjunttiva clear, no obvious abnormalities on inspection of external nose and ears  NECK: normal movements of the head and neck  LUNGS: on inspection no signs of respiratory distress, breathing rate appears normal, no obvious gross SOB, gasping or wheezing  CV: no obvious cyanosis  MS: moves all visible extremities without noticeable abnormality  PSYCH/NEURO: pleasant and cooperative, no obvious depression or anxiety, speech and thought processing grossly intact  ASSESSMENT AND PLAN:  Discussed the following assessment and plan:  COVID-19  -we discussed possible serious and likely etiologies, options for evaluation and workup, limitations of telemedicine visit vs in person visit, treatment, treatment risks and precautions. Pt prefers to treat via telemedicine empirically  rather than in person at this moment.  Currently has mild symptoms, fully vaccinated and had booster.   Discussed treatment options, ideal treatment window, potential complications, isolation and precautions for COVID-19.  She opted referral for Covid outpatient treatment.  Did discuss treatment shortage and the way the treatment center currently is triaging treatment doses.  She was understanding.  She did want a prescription for cough, Tessalon Rx sent.  Other symptomatic care measures summarized in patient instructions.  Advised a low threshold to seek prompt in person care if worsening, new symptoms arise, or if is not improving with treatment. Discussed options for inperson care if PCP office not available. Did let this patient know that I only do telemedicine on Tuesdays and Thursdays for [place]. Advised to schedule follow up visit with PCP or [facility] if any further questions or concerns to avoid delays in care.   I discussed the assessment and treatment plan with the patient. The patient was provided an opportunity to ask questions and all were answered. The patient agreed with the plan  and demonstrated an understanding of the instructions.     Lucretia Kern, DO

## 2020-12-21 NOTE — Patient Instructions (Signed)
  HOME CARE TIPS:   -I sent the medication(s) we discussed to your pharmacy: Meds ordered this encounter  Medications  . benzonatate (TESSALON PERLES) 100 MG capsule    Sig: Take 1 capsule (100 mg total) by mouth 3 (three) times daily as needed.    Dispense:  20 capsule    Refill:  0     -I sent a message to the outpatient Covid treatment center letting them know that you are interested in treatment. Currently there are very limited treatment doses available and the are calling the folks that are at the highest risk of hospitalization or severe disease first.   -can use tylenol if needed for fevers, aches and pains per instructions  -can use nasal saline a few times per day if you have nasal congestion  -stay hydrated, drink plenty of fluids and eat small healthy meals - avoid dairy  -can take 1000 IU (45mcg) Vit D3 and 100-500 mg of Vit C daily per instructions  -If the Covid test is positive, check out the CDC website for more information on home care, transmission and treatment for COVID19  -follow up with your doctor in 2-3 days unless improving and feeling better  -stay home while sick, except to seek medical care, and if you have [place] ideally it would be best to stay home for a full 10 days since the onset of symptoms PLUS one day of no fever and feeling better. Wear a good mask (such as N95 or KN95) if around others to reduce the risk of transmission.  It was nice to meet you today, and I really hope you are feeling better soon. I help [facility] out with telemedicine visits on Tuesdays and Thursdays and am available for visits on those days. If you have any concerns or questions following this visit please schedule a follow up visit with your Primary Care doctor or seek care at a local urgent care clinic to avoid delays in care.    Seek in person care or schedule a follow up video visit promptly if your symptoms worsen, new concerns arise or you are not improving with  treatment. Call 911 and/or seek emergency care if your symptoms are severe or life threatening.

## 2020-12-22 ENCOUNTER — Telehealth: Payer: Self-pay | Admitting: Family

## 2020-12-22 ENCOUNTER — Telehealth (HOSPITAL_COMMUNITY): Payer: Self-pay

## 2020-12-22 ENCOUNTER — Other Ambulatory Visit (HOSPITAL_COMMUNITY): Payer: Self-pay | Admitting: Family

## 2020-12-22 DIAGNOSIS — U071 COVID-19: Secondary | ICD-10-CM

## 2020-12-22 MED ORDER — NIRMATRELVIR/RITONAVIR (PAXLOVID)TABLET
ORAL_TABLET | ORAL | 0 refills | Status: DC
Start: 2020-12-22 — End: 2021-01-19

## 2020-12-22 MED FILL — PAXLOVID 20 X 150 MG & 10 X: 20 X 150 MG | 5 days supply | Qty: 30 | Fill #0

## 2020-12-22 NOTE — Telephone Encounter (Signed)
Outpatient Oral COVID Treatment Note  I connected with [name] on 12/22/2020/12:45 PM by telephone and verified that I am speaking with the correct person using two identifiers.  I discussed the limitations, risks, security, and privacy concerns of performing an evaluation and management service by telephone and the availability of in person appointments. I also discussed with the patient that there may be a patient responsible charge related to this service. The patient expressed understanding and agreed to proceed.  Patient location: Home  Provider location: Clinic  Diagnosis: COVID-19 infection  Purpose of visit: Discussion of potential use of Molnupiravir or Paxlovid, a new treatment for mild to moderate COVID-19 viral infection in non-hospitalized patients.   Subjective: Patient is a 75 y.o. female who has been diagnosed with COVID 19 viral infection.  Their symptoms began on 12/19/20 with cough, fever, sore throat.    Past Medical History:  Diagnosis Date  . Arthritis    OA  . Atrial flutter ([place])    a. s/p TEE/DCCV 08/16/13 (normal LV function, no LAA thrombus).b. s/p ablation by Dr Lovena Le 09-23-2013  . Coronary artery disease    2009 LAD Promus stent  . Dysrhythmia    Aflutter, no issues after ablation 2014  . Fibroid   . Hyperlipidemia   . Insulin pump in place   . Osteopenia   . Type 1 diabetes mellitus on insulin therapy ([place])   . Valvular heart disease    a. Mild MR/TR by TEE 08/2013.    Allergies  Allergen Reactions  . Sulfa Antibiotics Rash     Current Outpatient Medications:  .  benzonatate (TESSALON PERLES) 100 MG capsule, Take 1 capsule (100 mg total) by mouth 3 (three) times daily as needed., Disp: 20 capsule, Rfl: 0 .  Calcium Carbonate-Vitamin D (CALCIUM + D PO), Take 600 mg by mouth 2 (two) times daily., Disp: , Rfl:  .  cephALEXin (KEFLEX) 500 MG capsule, Take 1 capsule (500 mg total) by mouth 2 (two) times daily for 5 days., Disp: 10 capsule, Rfl:  0 .  cholecalciferol (VITAMIN D) 1000 units tablet, Take 1,000 Units by mouth 2 (two) times daily. , Disp: , Rfl:  .  clopidogrel (PLAVIX) 75 MG tablet, TAKE 1 TABLET DAILY, Disp: 90 tablet, Rfl: 3 .  Continuous Blood Gluc Sensor (DEXCOM G6 SENSOR) MISC, by Does not apply route., Disp: , Rfl:  .  ezetimibe (ZETIA) 10 MG tablet, TAKE 1 TABLET DAILY, Disp: 90 tablet, Rfl: 3 .  furosemide (LASIX) 20 MG tablet, Take 1 tablet (20 mg total) by mouth as needed. Take 1 tablet by mouth as needed for 5 days., Disp: 30 tablet, Rfl: 11 .  Insulin Lispro-aabc (LYUMJEV) 100 UNIT/ML SOLN, Use max of 90 units daily via insulin pump. (Patient taking differently: Use max of 250 units daily via insulin pump.), Disp: 80 mL, Rfl: 3 .  losartan (COZAAR) 25 MG tablet, TAKE 1 TABLET DAILY, Disp: 90 tablet, Rfl: 3 .  metFORMIN (GLUCOPHAGE-XR) 500 MG 24 hr tablet, TAKE 1 TABLET IN THE MORNING AND 2 TABLETS IN THE EVENING AS DIRECTED, Disp: 270 tablet, Rfl: 3 .  metoprolol succinate (TOPROL-XL) 100 MG 24 hr tablet, TAKE 1 TABLET DAILY, Disp: 90 tablet, Rfl: 3 .  nitroGLYCERIN (NITROSTAT) 0.4 MG SL tablet, Place 1 tablet (0.4 mg total) under the tongue every 5 (five) minutes as needed for chest pain., Disp: 75 tablet, Rfl: 2 .  pioglitazone (ACTOS) 15 MG tablet, Take 1 tablet (15 mg total) by  mouth daily., Disp: 90 tablet, Rfl: 3 .  potassium chloride (K-DUR) 10 MEQ tablet, Take 1 tablet (10 mEq total) by mouth daily as needed (take with Lasix). (Patient not taking: Reported on 11/30/2020), Disp: 90 tablet, Rfl: 3 .  rosuvastatin (CRESTOR) 40 MG tablet, TAKE 1 TABLET DAILY, Disp: 90 tablet, Rfl: 3  Objective: Patient appears/sounds hoarse.  They are in no apparent distress.  Breathing is non labored.  Mood and behavior are normal.  Laboratory Data:  Recent Results (from the past 2160 hour(s))  Basic metabolic panel     Status: Abnormal   Collection Time: 11/27/20  9:19 AM  Result Value Ref Range   Sodium 136 135 - 145  mEq/L   Potassium 4.2 3.5 - 5.1 mEq/L   Chloride 101 96 - 112 mEq/L   CO2 28 19 - 32 mEq/L   Glucose, Bld 150 (H) 70 - 99 mg/dL   BUN 18 6 - 23 mg/dL   Creatinine, Ser 0.84 0.40 - 1.20 mg/dL   GFR 69.17 >60.00 mL/min    Comment: Calculated using the CKD-EPI Creatinine Equation (2021)   Calcium 9.3 8.4 - 10.5 mg/dL  Hemoglobin A1c     Status: Abnormal   Collection Time: 11/27/20  9:19 AM  Result Value Ref Range   Hgb A1c MFr Bld 7.2 (H) 4.6 - 6.5 %    Comment: Glycemic Control Guidelines for People with Diabetes:Non Diabetic:  <6%Goal of Therapy: <7%Additional Action Suggested:  >8%      Assessment: 75 y.o. female with mild/moderate COVID 19 viral infection diagnosed on 2/16 at high risk for progression to severe COVID 19.  Plan:  This patient is a 75 y.o. female that meets the following criteria for Emergency Use Authorization of: Paxlovid 1. Age >12 yr AND > 40 kg 2. SARS-COV-2 positive test 3. Symptom onset < 5 days 4. Mild-to-moderate COVID disease with high risk for severe progression to hospitalization or death  I have spoken and communicated the following to the patient or parent/caregiver regarding: 1. Paxlovid is an unapproved drug that is authorized for use under an Emergency Use Authorization.  2. There are no adequate, approved, available products for the treatment of COVID-19 in adults who have mild-to-moderate COVID-19 and are at high risk for progressing to severe COVID-19, including hospitalization or death. 3. Other therapeutics are currently authorized. For additional information on all products authorized for treatment or prevention of COVID-19, please see [employer].  4. There are benefits and risks of taking this treatment as outlined in the "Fact Sheet for Patients and Caregivers."  5. "Fact Sheet for Patients and Caregivers" was reviewed with patient. A  hard copy will be provided to patient from pharmacy prior to the patient receiving treatment. 6. Patients should continue to self-isolate and use infection control measures (e.g., wear mask, isolate, social distance, avoid sharing personal items, clean and disinfect "high touch" surfaces, and frequent handwashing) according to CDC guidelines.  7. The patient or parent/caregiver has the option to accept or refuse treatment. 8. Patient medication history was reviewed for potential drug interactions:Interaction with home meds: Crestor 9. Patient's GFR was calculated to be 69, and they were therefore prescribed Normal dose (GFR>60) - nirmatrelvir 150mg  tab (2 tablet) by mouth twice daily AND ritonavir 100mg  tab (1 tablet) by mouth twice daily   After reviewing above information with the patient, the patient agrees to receive Paxlovid.  Follow up instructions:    . Take prescription BID x 5 days as directed . Reach  out to pharmacist for counseling on medication if desired . For concerns regarding further COVID symptoms please follow up with your PCP or urgent care . For urgent or life-threatening issues, seek care at your local emergency department  The patient was provided an opportunity to ask questions, and all were answered. The patient agreed with the plan and demonstrated an understanding of the instructions.   Script sent to [facility] and opted to [employer] via mail order (verified patients address for delivery).  The patient was advised to call their PCP or seek an in-person evaluation if the symptoms worsen or if the condition fails to improve as anticipated.   I provided 12 minutes of non face-to-face telephone visit time during this encounter, and > 50% was spent counseling as documented under my assessment & plan.  Mauricio Po, FNP 12/22/2020 /12:45 PM

## 2020-12-22 NOTE — Addendum Note (Signed)
Addended by: Mauricio Po D on: 12/22/2020 12:49 PM   Modules accepted: Orders

## 2020-12-22 NOTE — Telephone Encounter (Signed)
Called to discuss with patient about COVID-19 symptoms and the use of one of the available treatments for those with mild to moderate Covid symptoms and at a high risk of hospitalization.  Pt appears to qualify for outpatient treatment due to co-morbid conditions and/or a member of an at-risk group in accordance with the FDA Emergency Use Authorization.    Symptom onset: 12/19/20  Vaccinated: Yes Booster? Yes Immunocompromised? No Qualifiers: Age, hypertension, diabetes, hyperlipidemia, CAD  [name] was seen through telehealth on 2/18 with fever, sore throat, congestion, diarrhea, and cough. She is vaccinated and boosted. Qualifying factors above. I attempted to contact [name] and was unable to reach her via phone. Left a voicemail and MyChart message. Appears to qualify for Paxlovid or Sotrovimab.   Terri Piedra, NP 12/22/2020 10:42 AM

## 2020-12-22 NOTE — Telephone Encounter (Signed)
Patient was prescribed oral covid treatment Paxlovid and treatment note was reviewed. Medication has been received by [facility] and reviewed for appropriateness.  Drug Interactions or Dosage Adjustments Noted: hold crestor  Delivery Method: pick up  Patient contacted for counseling on 12/22/20 and verbalized understanding.   Delivery or Pick-Up Date: 12/22/20   Alinda Dooms 12/22/2020, 1:23 PM [facility] Outpatient Pharmacist Phone# [phone]

## 2021-01-05 ENCOUNTER — Telehealth: Payer: Self-pay | Admitting: Endocrinology

## 2021-01-05 NOTE — Telephone Encounter (Signed)
Faxed OV to [facility] [phone].

## 2021-01-05 NOTE — Telephone Encounter (Signed)
[facility] called, they have been requesting clinical notes from pt's most recent office visit on 11/30/20 since the 16th of February. They ask that these clinical notes be sent to fax # [phone].

## 2021-01-15 ENCOUNTER — Telehealth: Payer: Self-pay | Admitting: Internal Medicine

## 2021-01-15 ENCOUNTER — Telehealth: Payer: Self-pay | Admitting: Pharmacist

## 2021-01-15 NOTE — Progress Notes (Signed)
  Chronic Care Management   Note  01/15/2021 Name: JESS SULAK MRN: [id number] DOB: May 17, 1946  TEAL RABEN is a 75 y.o. year old female who is a primary care patient of Hoyt Koch, MD. I reached out to [facility] [employer] by phone today in response to a referral sent by Ms. Merri Ray Kipnis's PCP, Hoyt Koch, MD.   Ms. Huwe was given information about Chronic Care Management services today including:  1. CCM service includes personalized support from designated clinical staff supervised by her physician, including individualized plan of care and coordination with other care providers 2. 24/7 contact phone numbers for assistance for urgent and routine care needs. 3. Service will only be billed when office clinical staff spend 20 minutes or more in a month to coordinate care. 4. Only one practitioner may furnish and bill the service in a calendar month. 5. The patient may stop CCM services at any time (effective at the end of the month) by phone call to the office staff.   Patient agreed to services and verbal consent obtained.   Follow up plan:   Carley Perdue [employer] Scheduler

## 2021-01-16 ENCOUNTER — Telehealth: Payer: Medicare Other

## 2021-01-16 NOTE — Progress Notes (Signed)
Chronic Care Management Pharmacy Assistant   Name: [name]  MRN: [id number] DOB: 1945-12-24   Reason for Encounter: Initial Questions      Medications: Outpatient Encounter Medications as of 01/15/2021  Medication Sig  . benzonatate (TESSALON PERLES) 100 MG capsule Take 1 capsule (100 mg total) by mouth 3 (three) times daily as needed.  . Calcium Carbonate-Vitamin D (CALCIUM + D PO) Take 600 mg by mouth 2 (two) times daily.  . cholecalciferol (VITAMIN D) 1000 units tablet Take 1,000 Units by mouth 2 (two) times daily.   . clopidogrel (PLAVIX) 75 MG tablet TAKE 1 TABLET DAILY  . Continuous Blood Gluc Sensor (DEXCOM G6 SENSOR) MISC by Does not apply route.  . ezetimibe (ZETIA) 10 MG tablet TAKE 1 TABLET DAILY  . furosemide (LASIX) 20 MG tablet Take 1 tablet (20 mg total) by mouth as needed. Take 1 tablet by mouth as needed for 5 days.  . Insulin Lispro-aabc (LYUMJEV) 100 UNIT/ML SOLN Use max of 90 units daily via insulin pump. (Patient taking differently: Use max of 250 units daily via insulin pump.)  . losartan (COZAAR) 25 MG tablet TAKE 1 TABLET DAILY  . metFORMIN (GLUCOPHAGE-XR) 500 MG 24 hr tablet TAKE 1 TABLET IN THE MORNING AND 2 TABLETS IN THE EVENING AS DIRECTED  . metoprolol succinate (TOPROL-XL) 100 MG 24 hr tablet TAKE 1 TABLET DAILY  . nirmatrelvir/ritonavir EUA (PAXLOVID) TABS Take nirmatrelvir (150 mg) 2 tablet(s) twice daily for 5 days and ritonavir (100 mg) one tablet twice daily for 5 days.  . nitroGLYCERIN (NITROSTAT) 0.4 MG SL tablet Place 1 tablet (0.4 mg total) under the tongue every 5 (five) minutes as needed for chest pain.  . pioglitazone (ACTOS) 15 MG tablet Take 1 tablet (15 mg total) by mouth daily.  . potassium chloride (K-DUR) 10 MEQ tablet Take 1 tablet (10 mEq total) by mouth daily as needed (take with Lasix). (Patient not taking: Reported on 11/30/2020)  . rosuvastatin (CRESTOR) 40 MG tablet TAKE 1 TABLET DAILY   No facility-administered encounter  medications on file as of 01/15/2021.    Care Gaps: Overdue eye exam  Star Rating Drugs: No ACE/ARB   Have you seen any other providers since your last visit? The patient states that the las provider she saw was Dr. Cathie Olden  Any changes in your medications or health? The patient states that she has not had any changes to her medication or health  Any side effects from any medications? The patient states that she does not have any side effects from medications  Do you have an symptoms or problems not managed by your medications? The patient states that she does not have any symptoms or problems not managed by medications  Any concerns about your health right now? The patient states that she does not have any concerns about her health at this time  Has your provider asked that you check blood pressure, blood sugar, or follow special diet at home?  The patient states that her only concern is that Dr. Cathie Olden asked her to keep a log of blood pressure but she has not done it so far. Patient states that she does check her blood sugar and this morning it was 93. She stated at other times it has been as high as 200 and low as 43  Do you get any type of exercise on a regular basis? The patient states that she does walk dogs every other day  Can you think of  a goal you would like to reach for your health? The patient states that her goal is to lose about 50 lbs and to be more fit  Do you have any problems getting your medications? The patient states that she does not have any problems getting medication or the cost of medications from the pharmacy  Is there anything that you would like to discuss during the appointment? The patient states that she does not have anything specific to discuss at this time  Patient's appointment was rescheduled for 03/01/21 at 2:00 pm  Please bring medications and supplements to appointment   Time spent:40

## 2021-01-19 ENCOUNTER — Ambulatory Visit (INDEPENDENT_AMBULATORY_CARE_PROVIDER_SITE_OTHER): Payer: Medicare Other | Admitting: Internal Medicine

## 2021-01-19 ENCOUNTER — Other Ambulatory Visit: Payer: Self-pay

## 2021-01-19 ENCOUNTER — Encounter: Payer: Self-pay | Admitting: Internal Medicine

## 2021-01-19 VITALS — BP 126/72 | HR 69 | Temp 98.0°F | Resp 18 | Ht 69.0 in | Wt 215.8 lb

## 2021-01-19 DIAGNOSIS — I10 Essential (primary) hypertension: Secondary | ICD-10-CM | POA: Diagnosis not present

## 2021-01-19 DIAGNOSIS — E1065 Type 1 diabetes mellitus with hyperglycemia: Secondary | ICD-10-CM

## 2021-01-19 DIAGNOSIS — E785 Hyperlipidemia, unspecified: Secondary | ICD-10-CM | POA: Diagnosis not present

## 2021-01-19 DIAGNOSIS — I2510 Atherosclerotic heart disease of native coronary artery without angina pectoris: Secondary | ICD-10-CM

## 2021-01-19 DIAGNOSIS — E1169 Type 2 diabetes mellitus with other specified complication: Secondary | ICD-10-CM

## 2021-01-19 NOTE — Assessment & Plan Note (Signed)
BP at goal on losartan, metoprolol and lasix. Recent CMP without indication for change.

## 2021-01-19 NOTE — Patient Instructions (Signed)
Health Maintenance, Female Adopting a healthy lifestyle and getting preventive care are important in promoting health and wellness. Ask your health care provider about:  The right schedule for you to have regular tests and exams.  Things you can do on your own to prevent diseases and keep yourself healthy. What should I know about diet, weight, and exercise? Eat a healthy diet  Eat a diet that includes plenty of vegetables, fruits, low-fat dairy products, and lean protein.  Do not eat a lot of foods that are high in solid fats, added sugars, or sodium.   Maintain a healthy weight Body mass index (BMI) is used to identify weight problems. It estimates body fat based on height and weight. Your health care provider can help determine your BMI and help you achieve or maintain a healthy weight. Get regular exercise Get regular exercise. This is one of the most important things you can do for your health. Most adults should:  Exercise for at least 150 minutes each week. The exercise should increase your heart rate and make you sweat (moderate-intensity exercise).  Do strengthening exercises at least twice a week. This is in addition to the moderate-intensity exercise.  Spend less time sitting. Even light physical activity can be beneficial. Watch cholesterol and blood lipids Have your blood tested for lipids and cholesterol at 73 years of age, then have this test every 5 years. Have your cholesterol levels checked more often if:  Your lipid or cholesterol levels are high.  You are older than 73 years of age.  You are at high risk for heart disease. What should I know about cancer screening? Depending on your health history and family history, you may need to have cancer screening at various ages. This may include screening for:  Breast cancer.  Cervical cancer.  Colorectal cancer.  Skin cancer.  Lung cancer. What should I know about heart disease, diabetes, and high blood  pressure? Blood pressure and heart disease  High blood pressure causes heart disease and increases the risk of stroke. This is more likely to develop in people who have high blood pressure readings, are of African descent, or are overweight.  Have your blood pressure checked: ? Every 3-5 years if you are 18-39 years of age. ? Every year if you are 40 years old or older. Diabetes Have regular diabetes screenings. This checks your fasting blood sugar level. Have the screening done:  Once every three years after age 40 if you are at a normal weight and have a low risk for diabetes.  More often and at a younger age if you are overweight or have a high risk for diabetes. What should I know about preventing infection? Hepatitis B If you have a higher risk for hepatitis B, you should be screened for this virus. Talk with your health care provider to find out if you are at risk for hepatitis B infection. Hepatitis C Testing is recommended for:  Everyone born from 1945 through 1965.  Anyone with known risk factors for hepatitis C. Sexually transmitted infections (STIs)  Get screened for STIs, including gonorrhea and chlamydia, if: ? You are sexually active and are younger than 73 years of age. ? You are older than 73 years of age and your health care provider tells you that you are at risk for this type of infection. ? Your sexual activity has changed since you were last screened, and you are at increased risk for chlamydia or gonorrhea. Ask your health care provider   if you are at risk.  Ask your health care provider about whether you are at high risk for HIV. Your health care provider may recommend a prescription medicine to help prevent HIV infection. If you choose to take medicine to prevent HIV, you should first get tested for HIV. You should then be tested every 3 months for as long as you are taking the medicine. Pregnancy  If you are about to stop having your period (premenopausal) and  you may become pregnant, seek counseling before you get pregnant.  Take 400 to 800 micrograms (mcg) of folic acid every day if you become pregnant.  Ask for birth control (contraception) if you want to prevent pregnancy. Osteoporosis and menopause Osteoporosis is a disease in which the bones lose minerals and strength with aging. This can result in bone fractures. If you are 65 years old or older, or if you are at risk for osteoporosis and fractures, ask your health care provider if you should:  Be screened for bone loss.  Take a calcium or vitamin D supplement to lower your risk of fractures.  Be given hormone replacement therapy (HRT) to treat symptoms of menopause. Follow these instructions at home: Lifestyle  Do not use any products that contain nicotine or tobacco, such as cigarettes, e-cigarettes, and chewing tobacco. If you need help quitting, ask your health care provider.  Do not use street drugs.  Do not share needles.  Ask your health care provider for help if you need support or information about quitting drugs. Alcohol use  Do not drink alcohol if: ? Your health care provider tells you not to drink. ? You are pregnant, may be pregnant, or are planning to become pregnant.  If you drink alcohol: ? Limit how much you use to 0-1 drink a day. ? Limit intake if you are breastfeeding.  Be aware of how much alcohol is in your drink. In the [place] one drink equals one 12 oz bottle of beer (355 mL), one 5 oz glass of wine (148 mL), or one 1 oz glass of hard liquor (44 mL). General instructions  Schedule regular health, dental, and eye exams.  Stay current with your vaccines.  Tell your health care provider if: ? You often feel depressed. ? You have ever been abused or do not feel safe at home. Summary  Adopting a healthy lifestyle and getting preventive care are important in promoting health and wellness.  Follow your health care provider's instructions about healthy  diet, exercising, and getting tested or screened for diseases.  Follow your health care provider's instructions on monitoring your cholesterol and blood pressure. This information is not intended to replace advice given to you by your health care provider. Make sure you discuss any questions you have with your health care provider. Document Revised: 10/14/2018 Document Reviewed: 10/14/2018 Elsevier Patient Education  2021 [employer]vier Inc.  

## 2021-01-19 NOTE — Assessment & Plan Note (Signed)
Doing well overall with control. Foot exam done. Will get records from eye exam which is up to date. Seeing endo for management of her pump and also taking metformin and actos.

## 2021-01-19 NOTE — Progress Notes (Signed)
   Subjective:   Patient ID: [name], female    DOB: [birth year], 75 y.o.   MRN: [id number]  HPI The patient is a 75 YO female coming in for follow up diabetes (insulin pump working better now, 1 low sugar to 43 but none since, did have recent GI bug but was able to manage fluids and sugars, denies change in feet does check them), and cholesterol (recent labs Sept 2021 normal, taking crestor 40 mg and zetia daily, denies missing or side effects), and blood pressure (taking lasix and losartan and metoprolol, denies side effects, denies chest pains or headaches).   Review of Systems  Constitutional: Negative.   HENT: Negative.   Eyes: Negative.   Respiratory: Negative for cough, chest tightness and shortness of breath.   Cardiovascular: Negative for chest pain, palpitations and leg swelling.  Gastrointestinal: Negative for abdominal distention, abdominal pain, constipation, diarrhea, nausea and vomiting.  Musculoskeletal: Negative.   Skin: Negative.   Neurological: Negative.   Psychiatric/Behavioral: Negative.     Objective:  Physical Exam Constitutional:      Appearance: She is well-developed.  HENT:     Head: Normocephalic and atraumatic.  Cardiovascular:     Rate and Rhythm: Normal rate and regular rhythm.  Pulmonary:     Effort: Pulmonary effort is normal. No respiratory distress.     Breath sounds: Normal breath sounds. No wheezing or rales.  Abdominal:     General: Bowel sounds are normal. There is no distension.     Palpations: Abdomen is soft.     Tenderness: There is no abdominal tenderness. There is no rebound.  Musculoskeletal:     Cervical back: Normal range of motion.  Skin:    General: Skin is warm and dry.  Neurological:     Mental Status: She is alert and oriented to person, place, and time.     Coordination: Coordination normal.     Vitals:   01/19/21 0937  BP: 126/72  Pulse: 69  Resp: 18  Temp: 98 F (36.7 C)  TempSrc: Oral  SpO2: 97%   Weight: 215 lb 12.8 oz (97.9 kg)  Height: 5\' 9"  (1.753 m)    This visit occurred during the SARS-CoV-2 public health emergency.  Safety protocols were in place, including screening questions prior to the visit, additional usage of staff PPE, and extensive cleaning of exam room while observing appropriate contact time as indicated for disinfecting solutions.   Assessment & Plan:

## 2021-01-19 NOTE — Assessment & Plan Note (Signed)
Recent lipid panel at goal on crestor and zetia. Will continue without change. LDL goal <70.

## 2021-02-13 ENCOUNTER — Other Ambulatory Visit: Payer: Self-pay

## 2021-02-13 ENCOUNTER — Ambulatory Visit: Payer: Medicare Other | Admitting: Internal Medicine

## 2021-02-14 ENCOUNTER — Other Ambulatory Visit: Payer: Self-pay

## 2021-02-14 ENCOUNTER — Ambulatory Visit (INDEPENDENT_AMBULATORY_CARE_PROVIDER_SITE_OTHER): Payer: Medicare Other | Admitting: Internal Medicine

## 2021-02-14 ENCOUNTER — Encounter: Payer: Self-pay | Admitting: Internal Medicine

## 2021-02-14 VITALS — BP 124/64 | HR 87 | Temp 98.0°F | Resp 18 | Ht 69.0 in | Wt 213.4 lb

## 2021-02-14 DIAGNOSIS — R350 Frequency of micturition: Secondary | ICD-10-CM

## 2021-02-14 DIAGNOSIS — I2510 Atherosclerotic heart disease of native coronary artery without angina pectoris: Secondary | ICD-10-CM | POA: Diagnosis not present

## 2021-02-14 LAB — POCT URINALYSIS DIPSTICK
Bilirubin, UA: NEGATIVE
Blood, UA: NEGATIVE
Glucose, UA: NEGATIVE
Ketones, UA: NEGATIVE
Leukocytes, UA: NEGATIVE
Nitrite, UA: NEGATIVE
Protein, UA: NEGATIVE
Spec Grav, UA: 1.025 (ref 1.010–1.025)
Urobilinogen, UA: 0.2 E.U./dL
pH, UA: 6 (ref 5.0–8.0)

## 2021-02-14 MED ORDER — FLUCONAZOLE 150 MG PO TABS: 150.0000 mg | ORAL_TABLET | ORAL | 0 refills | Status: DC

## 2021-02-14 NOTE — Progress Notes (Signed)
   Subjective:   Patient ID: [name], female    DOB: 12/08/1945, 75 y.o.   MRN: [id number]  HPI The patient is a 75 YO female coming in for concerns about burning with urination. Some itching perineum in the last 2-3 weeks. Does have diabetes and denies change in sugars recently. Denies abdominal pain or back pain. Denies fevers or chills. Did have recent course of antibiotics in the last 1-2 months for UTI. Denies vaginal discharge.   Review of Systems  Constitutional: Negative.   HENT: Negative.   Eyes: Negative.   Respiratory: Negative for cough, chest tightness and shortness of breath.   Cardiovascular: Negative for chest pain, palpitations and leg swelling.  Gastrointestinal: Negative for abdominal distention, abdominal pain, constipation, diarrhea, nausea and vomiting.  Genitourinary: Positive for dysuria.  Musculoskeletal: Negative.   Skin: Negative.   Neurological: Negative.   Psychiatric/Behavioral: Negative.     Objective:  Physical Exam Constitutional:      Appearance: She is well-developed.  HENT:     Head: Normocephalic and atraumatic.  Cardiovascular:     Rate and Rhythm: Normal rate and regular rhythm.  Pulmonary:     Effort: Pulmonary effort is normal. No respiratory distress.     Breath sounds: Normal breath sounds. No wheezing or rales.  Abdominal:     General: Bowel sounds are normal. There is no distension.     Palpations: Abdomen is soft.     Tenderness: There is no abdominal tenderness. There is no rebound.  Musculoskeletal:     Cervical back: Normal range of motion.  Skin:    General: Skin is warm and dry.  Neurological:     Mental Status: She is alert and oriented to person, place, and time.     Coordination: Coordination normal.     Vitals:   02/14/21 0912  BP: 124/64  Pulse: 87  Resp: 18  Temp: 98 F (36.7 C)  TempSrc: Oral  SpO2: 98%  Weight: 213 lb 6.4 oz (96.8 kg)  Height: 5\' 9"  (1.753 m)    This visit occurred during the  SARS-CoV-2 public health emergency.  Safety protocols were in place, including screening questions prior to the visit, additional usage of staff PPE, and extensive cleaning of exam room while observing appropriate contact time as indicated for disinfecting solutions.   Assessment & Plan:

## 2021-02-14 NOTE — Patient Instructions (Addendum)
We have sent in diflucan to take 1 pill today. Then in 3 days if still having the problem take a second.  May 19th for [name] to get booster

## 2021-02-15 DIAGNOSIS — R350 Frequency of micturition: Secondary | ICD-10-CM | POA: Insufficient documentation

## 2021-02-15 NOTE — Assessment & Plan Note (Signed)
U/A done in office not consistent with infection. Rx diflucan as she likely has candidal infection given recent antibiotics and given concurrent diabetes will do 2 dose course.

## 2021-02-28 ENCOUNTER — Encounter: Payer: Self-pay | Admitting: Endocrinology

## 2021-02-28 ENCOUNTER — Ambulatory Visit (INDEPENDENT_AMBULATORY_CARE_PROVIDER_SITE_OTHER): Payer: Medicare Other | Admitting: Endocrinology

## 2021-02-28 ENCOUNTER — Telehealth: Payer: Self-pay

## 2021-02-28 ENCOUNTER — Other Ambulatory Visit: Payer: Self-pay

## 2021-02-28 VITALS — BP 138/76 | HR 72 | Ht 69.0 in | Wt 219.0 lb

## 2021-02-28 DIAGNOSIS — E1069 Type 1 diabetes mellitus with other specified complication: Secondary | ICD-10-CM | POA: Diagnosis not present

## 2021-02-28 DIAGNOSIS — E1169 Type 2 diabetes mellitus with other specified complication: Secondary | ICD-10-CM | POA: Diagnosis not present

## 2021-02-28 DIAGNOSIS — I2510 Atherosclerotic heart disease of native coronary artery without angina pectoris: Secondary | ICD-10-CM

## 2021-02-28 DIAGNOSIS — E785 Hyperlipidemia, unspecified: Secondary | ICD-10-CM

## 2021-02-28 DIAGNOSIS — E1065 Type 1 diabetes mellitus with hyperglycemia: Secondary | ICD-10-CM

## 2021-02-28 LAB — LIPID PANEL
Cholesterol: 104 mg/dL (ref 0–200)
HDL: 47.1 mg/dL (ref >39.00)
LDL Cholesterol: 39 mg/dL (ref 0–99)
NonHDL: 57.17
Total CHOL/HDL Ratio: 2
Triglycerides: 89 mg/dL (ref 0.0–149.0)
VLDL: 17.8 mg/dL (ref 0.0–40.0)

## 2021-02-28 LAB — COMPREHENSIVE METABOLIC PANEL WITH GFR
ALT: 30 U/L (ref 0–35)
AST: 23 U/L (ref 0–37)
Albumin: 4.1 g/dL (ref 3.5–5.2)
Alkaline Phosphatase: 91 U/L (ref 39–117)
BUN: 18 mg/dL (ref 6–23)
CO2: 23 meq/L (ref 19–32)
Calcium: 9.9 mg/dL (ref 8.4–10.5)
Chloride: 105 meq/L (ref 96–112)
Creatinine, Ser: 0.82 mg/dL (ref 0.40–1.20)
GFR: 71.07 mL/min
Glucose, Bld: 132 mg/dL — ABNORMAL HIGH (ref 70–99)
Potassium: 4.6 meq/L (ref 3.5–5.1)
Sodium: 139 meq/L (ref 135–145)
Total Bilirubin: 0.5 mg/dL (ref 0.2–1.2)
Total Protein: 7 g/dL (ref 6.0–8.3)

## 2021-02-28 LAB — POCT GLYCOSYLATED HEMOGLOBIN (HGB A1C): Hemoglobin A1C: 6.7 % — AB (ref 4.0–5.6)

## 2021-02-28 NOTE — Progress Notes (Signed)
Chronic Care Management Pharmacy Assistant   Name: [name]  MRN: [id number] DOB: 22-May-1946   Reason for Encounter: Initial Questions Appointment: Telephone 03/01/21 - 2 pm    Recent office visits:  02/14/21 [name] (PCP) - Urinary frequency. Start Fluconzale 150 mg every 72 hrs.   01/19/21 [name] (PCP) - Hyperlipidemia. Start  Nirmatrelvir-Ritonavir. F/u 1 year.  12/18/20 [name] (PCP) - Televisit. Hematuria. Start Cephalexin 500 mg 2x day..  09/05/20 [name], [name] (PCP) -  UTI. Start nitrofurantoin. F/u prn.  Recent consult visits:  02/28/21 [name] (Endocrinology) - Uncontrolled type 1 diabetes mellitus with hyperglycemia. A1C 7.2. F/u 3 months.   12/21/20  [name] (Family Medicine) - Covid 71. Start  Benzonatate 100 mg 3x day.   12/14/20 [name] (Podiatry) - Callouses. Injection 1 mg Dexasone & Kenalog 3 mg Xylocaine. No med changes.  11/30/20 [name] (Endocrinology) - Uncontrolled type 1 diabetes  mellitus with hyperglycemia.  No med changes. F/u 3 months.   11/27/20 [name] (Cardiology) - Coronary Artery Disease; Stop aspirin, niacin and nitrofurantoin. Start Furosemide 20 mg for 5 days. F/u 1 year.    09/14/20 [name] (Endocrinology) - Uncontrolled type 1  diabetes mellitus with hyperglycemia. D/c Insulin Aspart. F/u 2  months.  09/11/20 - [name] (Optometry) - Type 1 diabetes mellitus without complications.  Hospital visits:  None in previous 6 months  Medications: Outpatient Encounter Medications as of 02/28/2021  Medication Sig  . Calcium Carbonate-Vitamin D (CALCIUM + D PO) Take 600 mg by mouth 2 (two) times daily.  . cholecalciferol (VITAMIN D) 1000 units tablet Take 1,000 Units by mouth 2 (two) times daily.   . clopidogrel (PLAVIX) 75 MG tablet TAKE 1 TABLET DAILY  . Continuous Blood Gluc Sensor (DEXCOM G6 SENSOR) MISC by Does not apply route.  . ezetimibe (ZETIA) 10 MG tablet TAKE 1 TABLET DAILY  . fluconazole (DIFLUCAN) 150 MG tablet Take 1 tablet (150 mg total) by mouth  every 3 (three) days.  . furosemide (LASIX) 20 MG tablet Take 1 tablet (20 mg total) by mouth as needed. Take 1 tablet by mouth as needed for 5 days.  . Insulin Lispro-aabc (LYUMJEV) 100 UNIT/ML SOLN Use max of 90 units daily via insulin pump. (Patient taking differently: Use max of 250 units daily via insulin pump.)  . losartan (COZAAR) 25 MG tablet TAKE 1 TABLET DAILY  . metFORMIN (GLUCOPHAGE-XR) 500 MG 24 hr tablet TAKE 1 TABLET IN THE MORNING AND 2 TABLETS IN THE EVENING AS DIRECTED  . metoprolol succinate (TOPROL-XL) 100 MG 24 hr tablet TAKE 1 TABLET DAILY  . nitroGLYCERIN (NITROSTAT) 0.4 MG SL tablet Place 1 tablet (0.4 mg total) under the tongue every 5 (five) minutes as needed for chest pain.  . pioglitazone (ACTOS) 15 MG tablet Take 1 tablet (15 mg total) by mouth daily.  . potassium chloride (K-DUR) 10 MEQ tablet Take 1 tablet (10 mEq total) by mouth daily as needed (take with Lasix).  . rosuvastatin (CRESTOR) 40 MG tablet TAKE 1 TABLET DAILY   No facility-administered encounter medications on file as of 02/28/2021.   Have you seen any other providers since your last visit?  Patient states she seen Dr. Dwyane Kelley today and was given a good report.  Any changes in your medications or health?  Patient states no changes in medications.  Any side effects from any medications?  Patient states she no longer takes Niacin, it caused her itching and a rash.  Do you have an symptoms or problems not managed  by your medications?  Patient states she has arthritis and doesn't take anything for it.  Any concerns about your health right now?  Patient states she has a Sprained ankle and ready for it to heal.  Has your provider asked that you check blood pressure, blood sugar, or follow special diet at home?   Patient states she uses a Dexcom G6 Sensor monitor and it    records her glucose for her. Patient states she doesn't follow a  special diet but she know she should.  Do you get any type of  exercise on a regular basis?  Patient states she walk dogs for her exercise.  Can you think of a goal you would like to reach for your health?  Patient states she would like to lose 50 lbs.  Do you have any problems getting your medications?  Patient states she has no problem getting medications.  Is there anything that you would like to discuss during the appointment?  Patient states not at this time.  Please bring medications and supplements to appointment    Star Rating Drugs: Losartan-last fill 01/07/21 90D Metformin-last fill 04/18/20 90D  Rosuvastatin-last fill 01/02/21 90D  Patient states she has extra Metformin on hand and hasn't needed a refill.  [name], [facility] Assistant [phone]  Time Spent: [id number]

## 2021-02-28 NOTE — Progress Notes (Signed)
Patient ID: LYA HOLBEN, female   DOB: Jun 27, 1946, 75 y.o.   MRN: [id number]   Reason for visit: DIABETES followup.  Diagnosis: Type 1 diabetes mellitus, date of diagnosis: 1979.    HISTORY of present illness:   Insulin Pump: CURRENT brand: T-Slim since 08/2020  BASAL settings:  Midnight = 0.55, 3:30 AM = 1.2, 6 AM = 2.25, 10 AM = 0.75,  2 PM = 1.25, 5 PM = 2.1, 7:30 PM = 2.1, 8 PM = 3.2 and 10 PM = 1.8.    Total daily insulin = usually about 65 units, maximum 90, using Lyumjev   Carbohydrate ratio 1: 12 breakfast, 1: 5 lunch and 1: 4.5 at dinner; sensitivity MN= 1:30; 1: 25 from 5 AM-7 AM, 7 AM-12 AM = 35; target 120-130  Insulin on board indicator on, duration 4 hrs   DIABETES: She has had long-standing diabetes with A1c levels usually above target. In 10/13 pioglitazone was restarted because of her large insulin requirement and diabetic dyslipidemia.  Her blood sugars improved significantly with this.  Also has been taking metformin as insulin sensitizer  RECENT history:   Her A1c is further improved at 6.7 compared to 7.2 and is the lowest level in a few years  Current diabetes management, blood sugar patterns from sensor and pump download:  Insulin pump management management:  She has had somewhat better blood sugars compared to her last visit as judged by time in range  Most of low blood sugars are stable with some variability as before  She does occasionally have persistently high readings through the night and may actually take extra boluses by entering carbohydrate amounts for correction  Mostly eating 2 meals a day as judged by her boluses  With higher fat meals she will have a late rise in blood sugar and is not consistently doing split boluses  Has had only minimal hypoglycemia and occasionally may overestimate her bolus amount to cause low sugars  She is using the sleep mode at night more recently  She has not been able to exercise regularly  because of ankle sprain  Correction factor was changed to 1: 30 on the last visit with better results   Her blood sugar analysis from the [facility] information is interpreted as below   Her blood sugars are on an average within the target range throughout the day and night  Although highest blood sugars are overnight on an average these are not consistent in the last 3 nights her blood sugars have been fairly good within the target range and stable  Also no overnight hypoglycemia  She does occasionally have persistently high readings through the night and may actually take extra boluses by entering carbohydrate amounts for correction  Postprandial readings are variably high and occasionally may be low normal or low with the bolus, may have some tendency to high readings after dinner at times, sometimes more than 1 hour later  Her CGM shows the following data:    CGM use % of time   2-week average/SD 143  Time in range       80 %  % Time Above 180  19  % Time above 250   % Time Below 70 1      PRE-MEAL  overnight  mornings  afternoon  evening Overall  Glucose range:       Averages/SD:  161+/-26  123/26  138/25  153/48  143    Previous data:   CGM use %  of time  98  2-week average/SD  162  Time in range       75%  % Time Above 180  25  % Time above 250   % Time Below 70  0      PRE-MEAL  overnight  mornings  afternoon  evening Overall  Glucose range:      60-400  Averages:  185  145  147  173  162     Hypoglycemic awareness: Has symptoms of feeling tired, headache, sweaty.  She thinks he can usually recognize symptoms  Sometimes may not be aware during the night and will be notified by her sensor that the sugar is low  She has symptoms when blood glucose is less than 60. Uses glucose tablets for treatment even overnight     DIET has been usually fairly controlled with carbohydrate and fat intake, tends to eat out on weekends The mealtimes are variable.    Food  preferences: Yogurt and cottage cheese for breakfast usually.   Blood sugars with exercise: No significant problems recently with adequate control and no hypoglycemia  .  Wt Readings from Last 3 Encounters:  02/28/21 219 lb (99.3 kg)  02/14/21 213 lb 6.4 oz (96.8 kg)  01/19/21 215 lb 12.8 oz (97.9 kg)    LABS:  Lab Results  Component Value Date   HGBA1C 6.7 (A) 02/28/2021   HGBA1C 7.2 (H) 11/27/2020   HGBA1C 7.4 (H) 07/19/2020   Lab Results  Component Value Date   MICROALBUR <0.7 04/18/2020   LDLCALC 40 07/19/2020   CREATININE 0.84 11/27/2020    Office Visit on 02/28/2021  Component Date Value Ref Range Status  . Hemoglobin A1C 02/28/2021 6.7* 4.0 - 5.6 % Final     Allergies as of 02/28/2021      Reactions   Sulfa Antibiotics Rash      Medication List       Accurate as of February 28, 2021  9:03 AM. If you have any questions, ask your nurse or doctor.        benzonatate 100 MG capsule Commonly known as: Tessalon Perles Take 1 capsule (100 mg total) by mouth 3 (three) times daily as needed.   CALCIUM + D PO Take 600 mg by mouth 2 (two) times daily.   cholecalciferol 1000 units tablet Commonly known as: VITAMIN D Take 1,000 Units by mouth 2 (two) times daily.   clopidogrel 75 MG tablet Commonly known as: PLAVIX TAKE 1 TABLET DAILY   Dexcom G6 Sensor Misc by Does not apply route.   ezetimibe 10 MG tablet Commonly known as: ZETIA TAKE 1 TABLET DAILY   fluconazole 150 MG tablet Commonly known as: DIFLUCAN Take 1 tablet (150 mg total) by mouth every 3 (three) days.   furosemide 20 MG tablet Commonly known as: LASIX Take 1 tablet (20 mg total) by mouth as needed. Take 1 tablet by mouth as needed for 5 days.   losartan 25 MG tablet Commonly known as: COZAAR TAKE 1 TABLET DAILY   Lyumjev 100 UNIT/ML Soln Generic drug: Insulin Lispro-aabc Use max of 90 units daily via insulin pump. What changed: additional instructions   metFORMIN 500 MG 24 hr  tablet Commonly known as: GLUCOPHAGE-XR TAKE 1 TABLET IN THE MORNING AND 2 TABLETS IN THE EVENING AS DIRECTED   metoprolol succinate 100 MG 24 hr tablet Commonly known as: TOPROL-XL TAKE 1 TABLET DAILY   nitroGLYCERIN 0.4 MG SL tablet Commonly known as: Nitrostat Place 1 tablet (  0.4 mg total) under the tongue every 5 (five) minutes as needed for chest pain.   pioglitazone 15 MG tablet Commonly known as: ACTOS Take 1 tablet (15 mg total) by mouth daily.   potassium chloride 10 MEQ tablet Commonly known as: KLOR-CON Take 1 tablet (10 mEq total) by mouth daily as needed (take with Lasix).   rosuvastatin 40 MG tablet Commonly known as: CRESTOR TAKE 1 TABLET DAILY       Allergies:  Allergies  Allergen Reactions  . Sulfa Antibiotics Rash    Past Medical History:  Diagnosis Date  . Arthritis    OA  . Atrial flutter ([place])    a. s/p TEE/DCCV 08/16/13 (normal LV function, no LAA thrombus).b. s/p ablation by Dr Lovena Le 09-23-2013  . Coronary artery disease    2009 LAD Promus stent  . Dysrhythmia    Aflutter, no issues after ablation 2014  . Fibroid   . Hyperlipidemia   . Insulin pump in place   . Osteopenia   . Type 1 diabetes mellitus on insulin therapy ([place])   . Valvular heart disease    a. Mild MR/TR by TEE 08/2013.    Past Surgical History:  Procedure Laterality Date  . ABLATION  09-23-2013   CTI by Dr Lovena Le  . APPENDECTOMY    . APPLICATION OF WOUND VAC Left 04/11/2017   Procedure: APPLICATION OF WOUND VAC LEFT KNEE;  Surgeon: Rod Can, MD;  Location: [place];  Service: Orthopedics;  Laterality: Left;  . ATRIAL FLUTTER ABLATION N/A 09/23/2013   Procedure: ATRIAL FLUTTER ABLATION;  Surgeon: Evans Lance, MD;  Location: [facility] CATH LAB;  Service: Cardiovascular;  Laterality: N/A;  . CARDIOVERSION N/A 08/16/2013   Procedure: CARDIOVERSION;  Surgeon: Thayer Headings, MD;  Location: [place];  Service: Cardiovascular;  Laterality: N/A;  spoke with [name]  .  CATARACT EXTRACTION Bilateral   . COLONOSCOPY    . CORONARY ANGIOPLASTY WITH STENT PLACEMENT    . MYOMECTOMY  1977  . ORIF PATELLA Left 04/11/2017  . ORIF PATELLA Left 04/11/2017   Procedure: OPEN REDUCTION INTERNAL (ORIF) FIXATION PATELLA LEFT KNEE;  Surgeon: Rod Can, MD;  Location: [place];  Service: Orthopedics;  Laterality: Left;  . PELVIC LAPAROSCOPY    . TEE WITHOUT CARDIOVERSION N/A 08/16/2013   Procedure: TRANSESOPHAGEAL ECHOCARDIOGRAM (TEE);  Surgeon: Thayer Headings, MD;  Location: [facility] ENDOSCOPY;  Service: Cardiovascular;  Laterality: N/A;    Family History  Problem Relation Age of Onset  . CAD Father 66       died age 67  . CAD Brother 20       small vessel disease  . Atrial fibrillation Mother   . Diabetes Mother   . Hypertension Mother   . CAD Cousin        paternal  . CAD Paternal Grandfather        early onset  . Colon cancer Neg Hx     Social History:  reports that she has never smoked. She has never used smokeless tobacco. She reports current alcohol use of about 5.0 standard drinks of alcohol per week. She reports that she does not use drugs.  REVIEW of systems:   Cardiac function: Her echo shows only grade 1 diastolic dysfunction and normal systolic function  She has had atrial arrhythmias treated with metoprolol  Blood pressure usually normal, on 25 mg losartan from cardiologist for preventive reasons  Blood pressure was higher on first measurement today, does not monitor at home  BP Readings from Last 3 Encounters:  02/28/21 138/76  02/14/21 124/64  01/19/21 126/72     She has had diabetic dyslipidemia with significantly high LDL particle number This has been well controlled with Crestor 40 mg and Niaspan 1000 mg Her last LDL particle number was down to below 900, labs pending from today  Niacin was stopped because of tending to get flushing and itching which may be late at night, not generally prevented by aspirin, also she finds the  regimen difficult to do at night  She has been regular with her Crestor and Zetia This is controlling HDL and improved particle number  Zetia added in 3/19  Labs as follows:  Lab Results  Component Value Date   CHOL 100 07/19/2020   CHOL 126 01/11/2020   CHOL 110 07/26/2019   Lab Results  Component Value Date   HDL 47.60 07/19/2020   HDL 47.00 01/11/2020   HDL 54.80 07/26/2019   Lab Results  Component Value Date   LDLCALC 40 07/19/2020   LDLCALC 59 01/11/2020   LDLCALC 44 07/26/2019   Lab Results  Component Value Date   TRIG 63.0 07/19/2020   TRIG 100.0 01/11/2020   TRIG 53.0 07/26/2019   Lab Results  Component Value Date   CHOLHDL 2 07/19/2020   CHOLHDL 3 01/11/2020   CHOLHDL 2 07/26/2019   Lab Results  Component Value Date   LDLDIRECT 79.0 01/11/2015   LDLDIRECT 73.1 10/13/2014      She has had consistently normal thyroid levels  Lab Results  Component Value Date   TSH 2.23 10/06/2019   TSH 1.86 01/19/2019   TSH 1.684 08/15/2013    No retinopathy reportedly on last exam    BP 138/76   Pulse 72   Ht 5\' 9"  (1.753 m)   Wt 219 lb (99.3 kg)   SpO2 98%   BMI 32.34 kg/m      ASSESSMENT/PLAN:  DIABETES type 1 on T-slim insulin pump  Her A1c is now 6.7 compared to 7.2   See history of present illness for detailed discussion of her current blood sugar patterns and management with her pump  Her insulin pump download and Dexcom data on the download was reviewed and interpreted as above  Blood sugar within goal 80% of the time now and improved A1c is also about the best he has had, this was however done by fingerstick today  She does have periodic high blood sugars either overnight or late evening after her meal but usually not having high readings during the day Also minimal hypoglycemia unless she over boluses Recently not exercising and is concerned about some weight gain  Recommendations:  Basal rate will be unchanged Bolus  consistently before starting to eat She will stop using the sleep mode at night to allow for corrections of persistently high readings that are not occurring otherwise Exercise when she is able to May take extra correction boluses with persistently high readings Program extra boluses for higher fat meals and use plate boluses  Hyperlipidemia: On Zetia and Crestor  We will recheck LDL particle number today  Follow-up in 4 months   There are no Patient Instructions on file for this visit.   Elayne Snare 02/28/21   Note: This office note was prepared with Dragon voice recognition system technology. Any transcriptional errors that result from this process are unintentional.

## 2021-02-28 NOTE — Patient Instructions (Signed)
No sleep mode

## 2021-03-01 ENCOUNTER — Ambulatory Visit (INDEPENDENT_AMBULATORY_CARE_PROVIDER_SITE_OTHER): Payer: Medicare Other | Admitting: Pharmacist

## 2021-03-01 DIAGNOSIS — E1169 Type 2 diabetes mellitus with other specified complication: Secondary | ICD-10-CM

## 2021-03-01 DIAGNOSIS — E785 Hyperlipidemia, unspecified: Secondary | ICD-10-CM | POA: Diagnosis not present

## 2021-03-01 DIAGNOSIS — I2510 Atherosclerotic heart disease of native coronary artery without angina pectoris: Secondary | ICD-10-CM | POA: Diagnosis not present

## 2021-03-01 DIAGNOSIS — I10 Essential (primary) hypertension: Secondary | ICD-10-CM

## 2021-03-01 DIAGNOSIS — M8949 Other hypertrophic osteoarthropathy, multiple sites: Secondary | ICD-10-CM | POA: Diagnosis not present

## 2021-03-01 DIAGNOSIS — E782 Mixed hyperlipidemia: Secondary | ICD-10-CM

## 2021-03-01 DIAGNOSIS — M8580 Other specified disorders of bone density and structure, unspecified site: Secondary | ICD-10-CM

## 2021-03-01 DIAGNOSIS — M159 Polyosteoarthritis, unspecified: Secondary | ICD-10-CM

## 2021-03-01 LAB — LIPOPROTEIN ANALYSIS BY NMR
HDL Particle Number: 33 umol/L (ref >=30.5)
LDL Particle Number: 914 nmol/L (ref <1000)
LDL Size: 20.3 nm — ABNORMAL LOW (ref >20.5)
LP-IR Score: 50 — ABNORMAL HIGH (ref <=45)
Small LDL Particle Number: 602 nmol/L — ABNORMAL HIGH (ref <=527)

## 2021-03-01 NOTE — Progress Notes (Signed)
Chronic Care Management Pharmacy Note  03/02/2021 Name:  [name] MRN:  [id number] DOB:  1946/07/28  Subjective: [name] is an 75 y.o. year old female who is a primary patient of Audrey Koch, MD.  The CCM team was consulted for assistance with disease management and care coordination needs.    Engaged with patient by telephone for initial visit in response to provider referral for pharmacy case management and/or care coordination services.   Consent to Services:  The patient was given the following information about Chronic Care Management services today, agreed to services, and gave verbal consent: 1. CCM service includes personalized support from designated clinical staff supervised by the primary care provider, including individualized plan of care and coordination with other care providers 2. 24/7 contact phone numbers for assistance for urgent and routine care needs. 3. Service will only be billed when office clinical staff spend 20 minutes or more in a month to coordinate care. 4. Only one practitioner may furnish and bill the service in a calendar month. 5.The patient may stop CCM services at any time (effective at the end of the month) by phone call to the office staff. 6. The patient will be responsible for cost sharing (co-pay) of up to 20% of the service fee (after annual deductible is met). Patient agreed to services and consent obtained.  Patient Care Team: Audrey Koch, MD as PCP - General (Internal Medicine) [name], Wonda Cheng, MD as PCP - Cardiology (Cardiology) Elayne Snare, MD (Endocrinology) [name], Wonda Cheng, MD (Cardiology) Audrey Coke, MD (Dermatology) Audrey Lance, MD (Cardiology) Audrey Dragon, MD (Inactive) (Gastroenterology) [name], [place] (Optometry) Audrey Pulley, DO (Sports Medicine) [name], [name] as Pharmacist (Pharmacist)   Pt lives with husband of 81 years, have lived in [place] the whole time. She has  82-year old granddaughter who just recently moved back to [facility] from [place] and they visit home.   They have a dog. They have season tickets to the [facility]. They have a beach house in [place].   Recent office visits: 02/14/21 [name] (PCP) - Urinary frequency. Start Fluconzale 150 mg every 72 hrs  01/19/21 [name] (PCP) - Hyperlipidemia. Start Nirmatrelvir-Ritonavir (Paxlovid for covid). F/u 1 year.  12/18/20 [name] (PCP) - Televisit. Hematuria. Start Cephalexin 500 mg 2x day.  09/05/20 [name], [name] (PCP) -  UTI. Start nitrofurantoin. F/u prn.  Recent consult visits: 02/28/21 [name] (Endocrinology) - Uncontrolled type 1 diabetes mellitus with hyperglycemia. A1C 7.2. F/u 3 months.  12/21/20  [name] (Family Medicine) - Covid 74. Start Benzonatate 100 mg 3x day.  12/14/20 [name] (Podiatry) - Callouses. Injection 1 mg Dexasone & Kenalog 3 mg Xylocaine. No med changes.  11/30/20 [name] (Endocrinology) - Uncontrolled type 1 diabetes mellitus with hyperglycemia.  No med changes. F/u 3 months.  11/27/20 [name] (Cardiology) - Coronary Artery Disease; Stop aspirin, niacin and nitrofurantoin. Start Furosemide 20 mg for 5 days. F/u 1 year.   09/14/20 [name] (Endocrinology) - Uncontrolled type 1  diabetes mellitus with hyperglycemia. D/c Insulin Aspart, change to biosimilar. F/u 2  months.  09/11/20- [name] (Optometry) - Type 1 diabetes mellitus without complications.  Hospital visits: None in previous 6 months  Objective:  Lab Results  Component Value Date   CREATININE 0.82 02/28/2021   BUN 18 02/28/2021   GFR 71.07 02/28/2021   GFRNONAA 69 11/17/2018   GFRAA 79 11/17/2018   NA 139 02/28/2021   K 4.6 02/28/2021   CALCIUM 9.9 02/28/2021  CO2 23 02/28/2021   GLUCOSE 132 (H) 02/28/2021    Lab Results  Component Value Date/Time   HGBA1C 6.7 (A) 02/28/2021 08:56 AM   HGBA1C 7.2 (H) 11/27/2020 09:19 AM   HGBA1C 7.4 (H) 07/19/2020 09:39 AM   FRUCTOSAMINE 270 10/06/2019 08:18 AM    FRUCTOSAMINE 292 (H) 02/04/2017 11:30 AM   GFR 71.07 02/28/2021 09:31 AM   GFR 69.17 11/27/2020 09:19 AM   MICROALBUR <0.7 04/18/2020 09:06 AM   MICROALBUR <0.7 01/11/2020 09:01 AM    Last diabetic Eye exam:  Lab Results  Component Value Date/Time   HMDIABEYEEXA No Retinopathy 09/09/2019 12:00 AM    Last diabetic Foot exam: No results found for: HMDIABFOOTEX   Lab Results  Component Value Date   CHOL 104 02/28/2021   HDL 47.10 02/28/2021   LDLCALC 39 02/28/2021   LDLDIRECT 79.0 01/11/2015   TRIG 89.0 02/28/2021   CHOLHDL 2 02/28/2021    Hepatic Function Latest Ref Rng & Units 02/28/2021 07/19/2020 01/11/2020  Total Protein 6.0 - 8.3 g/dL 7.0 6.5 6.9  Albumin 3.5 - 5.2 g/dL 4.1 3.8 4.0  AST 0 - 37 U/L $Remo'23 19 27  'CgECI$ ALT 0 - 35 U/L 30 22 33  Alk Phosphatase 39 - 117 U/L 91 83 102  Total Bilirubin 0.2 - 1.2 mg/dL 0.5 0.6 0.4  Bilirubin, Direct 0.00 - 0.40 mg/dL - - -    Lab Results  Component Value Date/Time   TSH 2.23 10/06/2019 08:18 AM   TSH 1.86 01/19/2019 08:48 AM    CBC Latest Ref Rng & Units 11/22/2019 04/11/2017 03/27/2017  WBC 3.4 - 10.8 x10E3/uL 6.5 5.5 6.5  Hemoglobin 11.1 - 15.9 g/dL 14.4 12.8 13.9  Hematocrit 34.0 - 46.6 % 43.9 39.5 41.3  Platelets 150 - 450 x10E3/uL 246 321 302    Lab Results  Component Value Date/Time   VD25OH 55.38 02/14/2017 11:35 AM   VD25OH 64 01/13/2014 07:40 AM    Clinical ASCVD: Yes  The ASCVD Risk score (Goff DC Jr., et al., 2013) failed to calculate for the following reasons:   The valid total cholesterol range is 130 to 320 mg/dL    Depression screen [facility] 2/9 02/14/2021 01/20/2020 04/20/2018  Decreased Interest 0 0 0  Down, Depressed, Hopeless 0 1 0  PHQ - 2 Score 0 1 0  Some recent data might be hidden      Social History   Tobacco Use  Smoking Status Never Smoker  Smokeless Tobacco Never Used   BP Readings from Last 3 Encounters:  02/28/21 138/76  02/14/21 124/64  01/19/21 126/72   Pulse Readings from Last 3  Encounters:  02/28/21 72  02/14/21 87  01/19/21 69   Wt Readings from Last 3 Encounters:  02/28/21 219 lb (99.3 kg)  02/14/21 213 lb 6.4 oz (96.8 kg)  01/19/21 215 lb 12.8 oz (97.9 kg)   BMI Readings from Last 3 Encounters:  02/28/21 32.34 kg/m  02/14/21 31.51 kg/m  01/19/21 31.87 kg/m    Assessment/Interventions: Review of patient past medical history, allergies, medications, health status, including review of consultants reports, laboratory and other test data, was performed as part of comprehensive evaluation and provision of chronic care management services.   SDOH:  (Social Determinants of Health) assessments and interventions performed: Yes SDOH Interventions   Flowsheet Row Most Recent Value  SDOH Interventions   Financial Strain Interventions Intervention Not Indicated     SDOH Screenings   Alcohol Screen: Not on file  Depression (PHQ2-9): Low  Risk   . PHQ-2 Score: 0  Financial Resource Strain: Low Risk   . Difficulty of Paying Living Expenses: Not hard at all  Food Insecurity: Not on file  Housing: Not on file  Physical Activity: Not on file  Social Connections: Not on file  Stress: Not on file  Tobacco Use: Low Risk   . Smoking Tobacco Use: Never Smoker  . Smokeless Tobacco Use: Never Used  Transportation Needs: Not on file    CCM Care Plan  Allergies  Allergen Reactions  . Sulfa Antibiotics Rash    Medications Reviewed Today    Reviewed by [name], [facility] (Pharmacist) on 03/02/21 at 1041  Med List Status: <None>  Medication Order Taking? Sig Documenting Provider Last Dose Status Informant  Calcium Carbonate-Vitamin D (CALCIUM + D PO) [id number] Yes Take 600 mg by mouth 2 (two) times daily. [provider] Taking Active Self  cholecalciferol (VITAMIN D) 1000 units tablet [id number] Yes Take 1,000 Units by mouth 2 (two) times daily.  [provider] Taking Active Self  clopidogrel (PLAVIX) 75 MG tablet [id number] Yes TAKE 1  TABLET DAILY Elayne Snare, MD Taking Active   Continuous Blood Gluc Sensor ([place]) [place] [id number] Yes by Does not apply route. [provider] Taking Active   ezetimibe (ZETIA) 10 MG tablet [id number] Yes TAKE 1 TABLET DAILY Elayne Snare, MD Taking Active   furosemide (LASIX) 20 MG tablet [id number] Yes Take 1 tablet (20 mg total) by mouth as needed. Take 1 tablet by mouth as needed for 5 days. [name], Wonda Cheng, MD Taking Active   ibuprofen (ADVIL) 200 MG tablet [id number] Yes Take 200 mg by mouth every 6 (six) hours as needed. [provider] Taking Active   Insulin Lispro-aabc (LYUMJEV) 100 UNIT/ML SOLN [id number] Yes Use max of 90 units daily via insulin pump.  Patient taking differently: Use max of 250 units daily via insulin pump.   Elayne Snare, MD Taking Active   losartan (COZAAR) 25 MG tablet [id number] Yes TAKE 1 TABLET DAILY [name], Wonda Cheng, MD Taking Active   metFORMIN (GLUCOPHAGE-XR) 500 MG 24 hr tablet [id number] Yes TAKE 1 TABLET IN THE MORNING AND 2 TABLETS IN THE EVENING AS DIRECTED Elayne Snare, MD Taking Active   metoprolol succinate (TOPROL-XL) 100 MG 24 hr tablet [id number] Yes TAKE 1 TABLET DAILY [name], Wonda Cheng, MD Taking Active   nitroGLYCERIN (NITROSTAT) 0.4 MG SL tablet [id number] Yes Place 1 tablet (0.4 mg total) under the tongue every 5 (five) minutes as needed for chest pain. [name], Wonda Cheng, MD Taking Active            Med Note [name]   Fri Mar 02, 2021 10:41 AM) Never used  pioglitazone (ACTOS) 15 MG tablet [id number] Yes Take 1 tablet (15 mg total) by mouth daily. Elayne Snare, MD Taking Active   potassium chloride (K-DUR) 10 MEQ tablet [id number] Yes Take 1 tablet (10 mEq total) by mouth daily as needed (take with Lasix). [name], Wonda Cheng, MD Taking Active   rosuvastatin (CRESTOR) 40 MG tablet [id number] Yes TAKE 1 TABLET DAILY Elayne Snare, MD Taking Active           Patient Active Problem List   Diagnosis Date Noted  . Urinary  frequency 02/15/2021  . Hypertension 05/13/2019  . Urinary incontinence 01/19/2019  . Valvular heart disease   . Insulin pump in place   . Fibroid   . Osteoarthritis   . Displaced comminuted fracture of  left patella, initial encounter for closed fracture 04/11/2017  . Routine general medical examination at a health care facility 11/08/2015  . Hyperlipidemia associated with type 2 diabetes mellitus ([place]) 03/16/2015  . 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 Uncontrolled type 1 diabetes mellitus ([place]) 05/12/2013  . Chronic insomnia 03/25/2012  . Osteopenia   . Coronary artery disease 04/05/2011  . Atrial flutter ([place]) 04/05/2011    Immunization History  Administered Date(s) Administered  . Hep A / Hep B 06/13/2016  . Influenza Split 08/04/2012  . Influenza, High Dose Seasonal PF 07/18/2016, 08/12/2017, 07/28/2018, 07/28/2018  . Influenza,inj,Quad PF,6+ Mos 08/09/2013, 08/11/2014  . Influenza-Unspecified 08/16/2015, 08/13/2020  . PFIZER(Purple Top)SARS-COV-2 Vaccination 11/23/2019, 12/14/2019, 07/18/2020  . Pneumococcal Conjugate-13 11/07/2014  . Pneumococcal Polysaccharide-23 11/05/2004, 09/24/2013  . Td 01/14/1997  . Tdap 03/25/2012  . Zoster 11/04/2010  . Zoster Recombinat (Shingrix) 08/18/2018, 10/22/2018    Conditions to be addressed/monitored:  Hypertension, Hyperlipidemia, Diabetes, Coronary Artery Disease, Osteopenia and Osteoarthritis  Care Plan : CCM Pharmacy Care Plan  Updates made by [name], [place] since 03/02/2021 12:00 AM    Problem: Hypertension, Hyperlipidemia, Diabetes, Coronary Artery Disease, Osteopenia and Osteoarthritis   Priority: High    Long-Range Goal: Disease management   Start Date: 03/02/2021  Expected End Date: 03/02/2022  This Visit's Progress: On track  Priority: High  Note:   Current Barriers:  . Unable to independently monitor therapeutic efficacy  Pharmacist Clinical Goal(s):  Marland Kitchen Patient will achieve  adherence to monitoring guidelines and medication adherence to achieve therapeutic efficacy through collaboration with PharmD and provider.   Interventions: . 1:1 collaboration with Audrey Koch, MD regarding development and update of comprehensive plan of care as evidenced by provider attestation and co-signature . Inter-disciplinary care team collaboration (see longitudinal plan of care) . Comprehensive medication review performed; medication list updated in electronic medical record  Hypertension (BP goal <130/80) -Controlled - pt endorses compliance and denies side effects -Current treatment: . Losartan 25 mg daily AM . Metoprolol succinate 100 mg daily AM . Furosemide 20 mg daily PRN (infrequently) -Current home readings: not checking -Denies hypotensive/hypertensive symptoms -Educated on BP goals and benefits of medications for prevention of heart attack, stroke and kidney damage; Daily salt intake goal < 2300 mg; Exercise goal of 150 minutes per week; -Counseled to monitor BP at home twice a week, document, and provide log at future appointments -Recommended to continue current medication  Hyperlipidemia / CAD: (LDL goal < 70) -Controlled  - LDL is at goal; pt endorses compliance and denies side effects -s/p PTCA/stent 2009 -Current treatment: . Rosuvastatin 40 mg daily AM . Ezetimibe 10 mg daily AM . Nitroglycerin 0.4 mg SL prn - never used . Clopidogrel 75 mg daily AM -Educated on Cholesterol goals;  Benefits of statin for ASCVD risk reduction; -Recommended to refill nitroglycerin every 6 months to ensure it is in date if needed  Diabetes (A1c goal <7%) -Controlled - pt has cut back on wine. Pt is planning to restart exercise at [place] -managed per Dr [name] -Current medications: Marland Kitchen Metformin XR 500 mg - 1 tab AM, 2 tab PM . Pioglitazone 15 mg daily . Insulin lispro via Tandem T-slim pump (TDD ~ 65 units) . Dexcom G6 -Current home glucose  readings: via CGM -Denies hypoglycemic/hyperglycemic symptoms -Current exercise: stationary bike, elliptical sometimes -Educated on A1c and blood sugar goals; Exercise goal of 150 minutes per week; -Counseled to check feet daily and get yearly eye exams -  Recommended to continue current medication  Osteopenia (Goal: prevent fractures) -Controlled -Last DEXA Scan: 11/2014  T-Score femoral neck: -1.7  T-Score lumbar spine: +0.6  10-year probability of major osteoporotic fracture: 15.3%  10-year probability of hip fracture: 2.1% -Patient is not a candidate for pharmacologic treatment -Current treatment  . Calcium-Vitamin D 600 mg BID . Vitamin D 1000 IU BID -Recommend [phone] units of vitamin D daily. Recommend 1200 mg of calcium daily from dietary and supplemental sources.  Health Maintenance -Vaccine gaps: none -Current therapy:  . Potassium chloride 10 mEq daily PRN w/ lasix . Ibuprofen 200 mg AM (arthritis in thumb) -Counseled on potential risks of chronic NSAID use including kidney damage, GI bleeding and BP elevations; pt is also taking clopidogrel which increases risk for bleeding -Recommend to limit ibuprofen use and always take with food  Patient Goals/Self-Care Activities . Patient will:  - take medications as prescribed focus on medication adherence by pill box check glucose via CGM, document, and provide at future appointments check blood pressure twice a week, document, and provide at future appointments target a minimum of 150 minutes of moderate intensity exercise weekly  -Limit ibuprofen use as much as possible and always take with food  Follow Up Plan: Telephone follow up appointment with care management team member scheduled for: 6 months      Medication Assistance: None required.  Patient affirms current coverage meets needs.  Patient's preferred pharmacy is:  [place], [place] - [address] [place]. [address] [place] [address] Phone: [phone] Fax: [phone]  [id number] - [place], [place] [address] [place] [address] Phone: [phone] Fax: [phone]  [place]. [place] [address] Phone: [phone] Fax: [phone]  Uses pill box? Yes Pt endorses 100% compliance  We discussed: Current pharmacy is preferred with insurance plan and patient is satisfied with pharmacy services Patient decided to: Continue current medication management strategy  Care Plan and Follow Up Patient Decision:  Patient agrees to Care Plan and Follow-up.  Plan: Telephone follow up appointment with care management team member scheduled for:  6 months  [name], PharmD, [place], CPP Clinical Pharmacist [facility] [phone]

## 2021-03-02 NOTE — Patient Instructions (Addendum)
Visit Information  Phone number for Pharmacist: [phone]  Thank you for meeting with me to discuss your medications! I look forward to working with you to achieve your health care goals. Below is a summary of what we talked about during the visit:  Goals Addressed            This Visit's Progress   . Manage My Medicine       Timeframe:  Long-Range Goal Priority:  Medium Start Date:       03/02/21                      Expected End Date:   03/02/22                    Follow Up Date 09/02/21   - call for medicine refill 2 or 3 days before it runs out - call if I am sick and can't take my medicine - keep a list of all the medicines I take; vitamins and herbals too - use a pillbox to sort medicine  -Limit ibuprofen use as much as possible and always take with food   Why is this important?   . These steps will help you keep on track with your medicines.   Notes:        [name] was given information about Chronic Care Management services today including:  1. CCM service includes personalized support from designated clinical staff supervised by her physician, including individualized plan of care and coordination with other care providers 2. 24/7 contact phone numbers for assistance for urgent and routine care needs. 3. Standard insurance, coinsurance, copays and deductibles apply for chronic care management only during months in which we provide at least 20 minutes of these services. Most insurances cover these services at 100%, however patients may be responsible for any copay, coinsurance and/or deductible if applicable. This service may help you avoid the need for more expensive face-to-face services. 4. Only one practitioner may furnish and bill the service in a calendar month. 5. The patient may stop CCM services at any time (effective at the end of the month) by phone call to the office staff.  Patient agreed to services and verbal consent obtained.   Patient verbalizes  understanding of instructions provided today and agrees to view in [place].  Telephone follow up appointment with pharmacy team member scheduled for: 6 months  [name], PharmD, Para [place], CPP Clinical Pharmacist [facility] at [facility] [phone]  Heart-Healthy Eating Plan Many factors influence your heart (coronary) health, including eating and exercise habits. Coronary risk increases with abnormal blood fat (lipid) levels. Heart-healthy meal planning includes limiting unhealthy fats, increasing healthy fats, and making other diet and lifestyle changes. What is my plan? Your health care provider may recommend that you:  Limit your fat intake to _________% or less of your total calories each day.  Limit your saturated fat intake to _________% or less of your total calories each day.  Limit the amount of cholesterol in your diet to less than _________ mg per day. What are tips for following this plan? Cooking Cook foods using methods other than frying. Baking, boiling, grilling, and broiling are all good options. Other ways to reduce fat include:  Removing the skin from poultry.  Removing all visible fats from meats.  Steaming vegetables in water or broth. Meal planning  At meals, imagine dividing your plate into fourths: ? Fill one-half of your plate with vegetables  and green salads. ? Fill one-fourth of your plate with whole grains. ? Fill one-fourth of your plate with lean protein foods.  Eat 4-5 servings of vegetables per day. One serving equals 1 cup raw or cooked vegetable, or 2 cups raw leafy greens.  Eat 4-5 servings of fruit per day. One serving equals 1 medium whole fruit,  cup dried fruit,  cup fresh, frozen, or canned fruit, or  cup 100% fruit juice.  Eat more foods that contain soluble fiber. Examples include apples, broccoli, carrots, beans, peas, and barley. Aim to get 25-30 g of fiber per day.  Increase your consumption of legumes, nuts,  and seeds to 4-5 servings per week. One serving of dried beans or legumes equals  cup cooked, 1 serving of nuts is  cup, and 1 serving of seeds equals 1 tablespoon.   Fats  Choose healthy fats more often. Choose monounsaturated and polyunsaturated fats, such as olive and canola oils, flaxseeds, walnuts, almonds, and seeds.  Eat more omega-3 fats. Choose salmon, mackerel, sardines, tuna, flaxseed oil, and ground flaxseeds. Aim to eat fish at least 2 times each week.  Check food labels carefully to identify foods with trans fats or high amounts of saturated fat.  Limit saturated fats. These are found in animal products, such as meats, butter, and cream. Plant sources of saturated fats include palm oil, palm kernel oil, and coconut oil.  Avoid foods with partially hydrogenated oils in them. These contain trans fats. Examples are stick margarine, some tub margarines, cookies, crackers, and other baked goods.  Avoid fried foods. General information  Eat more home-cooked food and less restaurant, buffet, and fast food.  Limit or avoid alcohol.  Limit foods that are high in starch and sugar.  Lose weight if you are overweight. Losing just 5-10% of your body weight can help your overall health and prevent diseases such as diabetes and heart disease.  Monitor your salt (sodium) intake, especially if you have high blood pressure. Talk with your health care provider about your sodium intake.  Try to incorporate more vegetarian meals weekly. What foods can I eat? Fruits All fresh, canned (in natural juice), or frozen fruits. Vegetables Fresh or frozen vegetables (raw, steamed, roasted, or grilled). Green salads. Grains Most grains. Choose whole wheat and whole grains most of the time. Rice and pasta, including brown rice and pastas made with whole wheat. Meats and other proteins Lean, well-trimmed beef, veal, pork, and lamb. Chicken and [place] without skin. All fish and shellfish. Wild duck,  rabbit, pheasant, and venison. Egg whites or low-cholesterol egg substitutes. Dried beans, peas, lentils, and tofu. Seeds and most nuts. Dairy Low-fat or nonfat cheeses, including ricotta and mozzarella. Skim or 1% milk (liquid, powdered, or evaporated). Buttermilk made with low-fat milk. Nonfat or low-fat yogurt. Fats and oils Non-hydrogenated (trans-free) margarines. Vegetable oils, including soybean, sesame, sunflower, olive, peanut, safflower, corn, canola, and cottonseed. Salad dressings or mayonnaise made with a vegetable oil. Beverages Water (mineral or sparkling). Coffee and tea. Diet carbonated beverages. Sweets and desserts Sherbet, gelatin, and fruit ice. Small amounts of dark chocolate. Limit all sweets and desserts. Seasonings and condiments All seasonings and condiments. The items listed above may not be a complete list of foods and beverages you can eat. Contact a dietitian for more options. What foods are not recommended? Fruits Canned fruit in heavy syrup. Fruit in cream or butter sauce. Fried fruit. Limit coconut. Vegetables Vegetables cooked in cheese, cream, or butter sauce. Fried vegetables. Grains  Breads made with saturated or trans fats, oils, or whole milk. Croissants. Sweet rolls. Donuts. High-fat crackers, such as cheese crackers. Meats and other proteins Fatty meats, such as hot dogs, ribs, sausage, bacon, rib-eye roast or steak. High-fat deli meats, such as salami and bologna. Caviar. Domestic duck and goose. Organ meats, such as liver. Dairy Cream, sour cream, cream cheese, and creamed cottage cheese. Whole milk cheeses. Whole or 2% milk (liquid, evaporated, or condensed). Whole buttermilk. Cream sauce or high-fat cheese sauce. Whole-milk yogurt. Fats and oils Meat fat, or shortening. Cocoa butter, hydrogenated oils, palm oil, coconut oil, palm kernel oil. Solid fats and shortenings, including bacon fat, salt pork, lard, and butter. Nondairy cream substitutes.  Salad dressings with cheese or sour cream. Beverages Regular sodas and any drinks with added sugar. Sweets and desserts Frosting. Pudding. Cookies. Cakes. Pies. Milk chocolate or white chocolate. Buttered syrups. Full-fat ice cream or ice cream drinks. The items listed above may not be a complete list of foods and beverages to avoid. Contact a dietitian for more information. Summary  Heart-healthy meal planning includes limiting unhealthy fats, increasing healthy fats, and making other diet and lifestyle changes.  Lose weight if you are overweight. Losing just 5-10% of your body weight can help your overall health and prevent diseases such as diabetes and heart disease.  Focus on eating a balance of foods, including fruits and vegetables, low-fat or nonfat dairy, lean protein, nuts and legumes, whole grains, and heart-healthy oils and fats. This information is not intended to replace advice given to you by your health care provider. Make sure you discuss any questions you have with your health care provider. Document Revised: 11/28/2017 Document Reviewed: 11/28/2017 Elsevier Patient Education  2021 [employer].

## 2021-03-04 DIAGNOSIS — Z23 Encounter for immunization: Secondary | ICD-10-CM | POA: Diagnosis not present

## 2021-03-07 ENCOUNTER — Telehealth: Payer: Self-pay | Admitting: Nutrition

## 2021-03-07 NOTE — Telephone Encounter (Signed)
Patient called requesting that her pump be linked to the practice.  I did this and she had no final questions.

## 2021-03-08 ENCOUNTER — Encounter: Payer: Self-pay | Admitting: Family Medicine

## 2021-03-08 ENCOUNTER — Ambulatory Visit: Payer: Self-pay

## 2021-03-08 ENCOUNTER — Ambulatory Visit (INDEPENDENT_AMBULATORY_CARE_PROVIDER_SITE_OTHER): Payer: Medicare Other

## 2021-03-08 ENCOUNTER — Other Ambulatory Visit: Payer: Self-pay

## 2021-03-08 ENCOUNTER — Ambulatory Visit (INDEPENDENT_AMBULATORY_CARE_PROVIDER_SITE_OTHER): Payer: Medicare Other | Admitting: Family Medicine

## 2021-03-08 VITALS — BP 110/56 | HR 75 | Ht 69.0 in | Wt 216.8 lb

## 2021-03-08 DIAGNOSIS — M25571 Pain in right ankle and joints of right foot: Secondary | ICD-10-CM

## 2021-03-08 DIAGNOSIS — S99921A Unspecified injury of right foot, initial encounter: Secondary | ICD-10-CM | POA: Diagnosis not present

## 2021-03-08 DIAGNOSIS — I2510 Atherosclerotic heart disease of native coronary artery without angina pectoris: Secondary | ICD-10-CM

## 2021-03-08 DIAGNOSIS — M79671 Pain in right foot: Secondary | ICD-10-CM

## 2021-03-08 DIAGNOSIS — M7989 Other specified soft tissue disorders: Secondary | ICD-10-CM | POA: Diagnosis not present

## 2021-03-08 NOTE — Progress Notes (Signed)
I, Wendy Poet, LAT, ATC, am serving as scribe for Dr. Lynne Leader.  GAE BIHL is a 75 y.o. female who presents to [place] at [facility] today for R ankle pain.  She was last seen by Dr. Tamala Julian on 09/21/19 for R elbow pain.  Since then, pt reports R ankle pain x approximately one month after she rolled her ankle into inversion while walking on some rocks at a local nursery.  She locates her pain to her R lateral ankle that radiates into her R medial ankle.  R ankle swelling: yes  Aggravating factors: walking/weight-bearing Treatments tried: walking boot; ice; Advil; elevation   Pertinent review of systems: No fevers or chills  Relevant historical information: Hypertension and diabetes.  History of AVN of the foot Osteopenia  Exam:  BP (!) 110/56 (BP Location: Left Arm, Patient Position: Sitting, Cuff Size: Normal)   Pulse 75   Ht 5\' 9"  (1.753 m)   Wt 216 lb 12.8 oz (98.3 kg)   SpO2 95%   BMI 32.02 kg/m  General: Well Developed, well nourished, and in no acute distress.   MSK: Right ankle and foot slight ankle effusion otherwise normal-appearing Normal ankle motion. Tender palpation right ATFL and dorsal lateral midfoot. Stable ligamentous exam. Intact strength. Pulses cap refill and sensation are intact distally.     Lab and Radiology Results   X-ray images right foot and ankle obtained today personally independently interpreted.  Right ankle: No fractures.  Mild degenerative changes.  Right foot: No fractures.  Degenerative changes present.  Await formal radiology review  Diagnostic Limited MSK Ultrasound of: Right ankle Joint effusion lateral joint line No acute fractures visible. Impression: Joint effusion    Assessment and Plan: 75 y.o. female with right foot and ankle pain after ankle inversion injury about a month ago.  Patient thought to have a ankle sprain.  She may also have some exacerbation of degenerative changes as well.   Discussed options.  Plan for ankle brace plus or minus cam walker boot.  Additionally recommend physical therapy and Voltaren gel.  Recheck back in about 1 month.  Return sooner if needed.   PDMP not reviewed this encounter. Orders Placed This Encounter  Procedures  . [place] LIMITED JOINT SPACE STRUCTURES LOW RIGHT(NO LINKED CHARGES)    Order Specific Question:   Reason for Exam (SYMPTOM  OR DIAGNOSIS REQUIRED)    Answer:   R ankle pain    Order Specific Question:   Preferred imaging location?    Answer:   [place]  . DG Foot Complete Right    Standing Status:   Future    Standing Expiration Date:   03/08/2022    Order Specific Question:   Reason for Exam (SYMPTOM  OR DIAGNOSIS REQUIRED)    Answer:   eval foot pain    Order Specific Question:   Preferred imaging location?    Answer:   [facility]  . DG Ankle Complete Right    Standing Status:   Future    Standing Expiration Date:   03/08/2022    Order Specific Question:   Reason for Exam (SYMPTOM  OR DIAGNOSIS REQUIRED)    Answer:   eval ankle pain    Order Specific Question:   Preferred imaging location?    Answer:   [facility]  . Ambulatory referral to Physical Therapy    Referral Priority:   Routine    Referral Type:   Physical  Medicine    Referral Reason:   Specialty Services Required    Requested Specialty:   Physical Therapy   No orders of the defined types were placed in this encounter.    Discussed warning signs or symptoms. Please see discharge instructions. Patient expresses understanding.   The above documentation has been reviewed and is accurate and complete Lynne Leader, M.D.

## 2021-03-08 NOTE — Patient Instructions (Addendum)
Thank you for coming in today.  Med Spec ASO Ankle Stabilizer  Ok to use cam walker as needed.   I've referred you to Physical Therapy.  Let us know if you don't hear from them in one week.  Please get an Xray today before you leave  Please use Voltaren gel (Generic Diclofenac Gel) up to 4x daily for pain as needed.  This is available over-the-counter as both the name brand Voltaren gel and the generic diclofenac gel.  Recheck in about 1 month.   Let me know sooner if you are having problems.

## 2021-03-12 NOTE — Progress Notes (Signed)
Spurring is present at the heel and at the lateral ankle bone

## 2021-03-12 NOTE — Progress Notes (Signed)
Bone spur is present at the heel but otherwise looks normal to radiology

## 2021-03-20 ENCOUNTER — Ambulatory Visit (INDEPENDENT_AMBULATORY_CARE_PROVIDER_SITE_OTHER): Payer: Medicare Other | Admitting: Physical Therapy

## 2021-03-20 ENCOUNTER — Other Ambulatory Visit: Payer: Self-pay

## 2021-03-20 ENCOUNTER — Encounter: Payer: Self-pay | Admitting: Physical Therapy

## 2021-03-20 DIAGNOSIS — M6281 Muscle weakness (generalized): Secondary | ICD-10-CM | POA: Diagnosis not present

## 2021-03-20 DIAGNOSIS — R600 Localized edema: Secondary | ICD-10-CM

## 2021-03-20 DIAGNOSIS — M25671 Stiffness of right ankle, not elsewhere classified: Secondary | ICD-10-CM | POA: Diagnosis not present

## 2021-03-20 DIAGNOSIS — R262 Difficulty in walking, not elsewhere classified: Secondary | ICD-10-CM | POA: Diagnosis not present

## 2021-03-20 DIAGNOSIS — M25571 Pain in right ankle and joints of right foot: Secondary | ICD-10-CM | POA: Diagnosis not present

## 2021-03-20 NOTE — Therapy (Signed)
[facility] [address] [place], [place], [address] Phone: [phone]   Fax:  [phone]  Physical Therapy Evaluation  Patient Details  Name: [name] MRN: [id number] Date of Birth: May 16, 1946 Referring Provider (PT): Gregor Hams, MD   Encounter Date: 03/20/2021   PT End of Session - 03/20/21 1231    Visit Number 1    Number of Visits 12    Date for PT Re-Evaluation 05/01/21    PT Start Time 1102    PT Stop Time 1145    PT Time Calculation (min) 43 min    Behavior During Therapy [facility] for tasks assessed/performed           Past Medical History:  Diagnosis Date  . Arthritis    OA  . Atrial flutter ([place])    a. s/p TEE/DCCV 08/16/13 (normal LV function, no LAA thrombus).b. s/p ablation by Dr Lovena Le 09-23-2013  . Coronary artery disease    2009 LAD Promus stent  . Dysrhythmia    Aflutter, no issues after ablation 2014  . Fibroid   . Hyperlipidemia   . Insulin pump in place   . Osteopenia   . Type 1 diabetes mellitus on insulin therapy ([place])   . Valvular heart disease    a. Mild MR/TR by TEE 08/2013.    Past Surgical History:  Procedure Laterality Date  . ABLATION  09-23-2013   CTI by Dr Lovena Le  . APPENDECTOMY    . APPLICATION OF WOUND VAC Left 04/11/2017   Procedure: APPLICATION OF WOUND VAC LEFT KNEE;  Surgeon: Rod Can, MD;  Location: [place];  Service: Orthopedics;  Laterality: Left;  . ATRIAL FLUTTER ABLATION N/A 09/23/2013   Procedure: ATRIAL FLUTTER ABLATION;  Surgeon: Evans Lance, MD;  Location: [facility] CATH LAB;  Service: Cardiovascular;  Laterality: N/A;  . CARDIOVERSION N/A 08/16/2013   Procedure: CARDIOVERSION;  Surgeon: Thayer Headings, MD;  Location: [place];  Service: Cardiovascular;  Laterality: N/A;  spoke with [name]  . CATARACT EXTRACTION Bilateral   . COLONOSCOPY    . CORONARY ANGIOPLASTY WITH STENT PLACEMENT    . MYOMECTOMY  1977  . ORIF PATELLA Left 04/11/2017  . ORIF PATELLA Left 04/11/2017    Procedure: OPEN REDUCTION INTERNAL (ORIF) FIXATION PATELLA LEFT KNEE;  Surgeon: Rod Can, MD;  Location: [place];  Service: Orthopedics;  Laterality: Left;  . PELVIC LAPAROSCOPY    . TEE WITHOUT CARDIOVERSION N/A 08/16/2013   Procedure: TRANSESOPHAGEAL ECHOCARDIOGRAM (TEE);  Surgeon: Thayer Headings, MD;  Location: [place];  Service: Cardiovascular;  Laterality: N/A;    There were no vitals filed for this visit.    Subjective Assessment - 03/20/21 1106    Subjective pt reports R ankle pain for approximately one month after she rolled her ankle into inversion while walking on some rocks at a local nursery.  She locates her pain to her R lateral ankle that radiates into her R medial ankle. She has been wearing ASO brace and relays this helps.    Pertinent History PMH: DM,CAD,OA,osteopenia    Limitations Lifting;Walking;Standing;House hold activities    How long can you stand comfortably? with brace can be on her feet 45 minutes in brace    How long can you walk comfortably? with brace 30 minutes    Diagnostic tests XR of Rt foot and ankle 03/08/21 "no fracture, some calcaneal and lateral mallolar spurring, Extensive calcaneal enthesophyte formation."    Currently in Pain? Yes    Pain Score  4     Pain Location Ankle    Pain Orientation Right    Pain Descriptors / Indicators Aching;Stabbing    Pain Type Acute pain    Pain Radiating Towards denies N/T and burning    Pain Onset 1 to 4 weeks ago    Pain Frequency Intermittent    Aggravating Factors  prolonged standing or walking    Pain Relieving Factors NSAIDS, ice    Multiple Pain Sites No              [facility] PT Assessment - 03/20/21 0001      Assessment   Medical Diagnosis Rt ankle/foot pain    Referring Provider (PT) Rodolph Bongorey, Evan S, MD    Onset Date/Surgical Date --   4 week onset of pain, inversion injury   Next MD Visit 04/17/21    Prior Therapy none      Precautions   Precaution Comments currently in ASO brace       Balance Screen   Has the patient fallen in the past 6 months No    Has the patient had a decrease in activity level because of a fear of falling?  No    Is the patient reluctant to leave their home because of a fear of falling?  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   Living Environment Private residence      Prior Function   Level of Independence Independent    Vocation Retired    Leisure garden and Diplomatic [employer] operational officerbeach      Cognition   Overall Cognitive Status Within Functional Limits for tasks assessed      Observation/Other Assessments   Observations minimal swelling today    Focus on Therapeutic Outcomes (FOTO)  50% functional intake      ROM / Strength   AROM / PROM / Strength AROM;Strength      AROM   Overall AROM Comments WFL except Rt ankle DF limited to 3 degrees      Strength   Overall Strength Comments in supine 4+/5 grossly for Rt ankle strength      Special Tests   Other special tests tandem balance about 5 sec avg, SLS on Rt about 2 sec avg, negative anterior drawer test for Rt ankle instability and negative heel thump test to rule out fracture      Ambulation/Gait   Gait Comments PLOF was normal community ambulator, since injury is limited community ambulator has been wearing ASO for ambulation but did trial walking 150 feet without ASO brace and up/down ramp fwd and lateral without increased pain                      Objective measurements completed on examination: See above findings.       [facility] Adult PT Treatment/Exercise - 03/20/21 0001      Exercises   Exercises Ankle      Manual Therapy   Manual therapy comments Rt ankle PROM into DF      Ankle Exercises: Stretches   Soleus Stretch 30 seconds;1 rep    Gastroc Stretch 30 seconds;1 rep    Other Stretch lunge stretch for DF mobility with Rt foot in chair 10 sec X10      Ankle Exercises: Standing   SLS 3 sec X3    Other Standing Ankle Exercises tandem balance 20 sec X2 with Rt foot behind    Other  Standing Ankle Exercises heel and toe raises X10  Walked up down ramp fwd and lateral X 1 ea without ASO brace         PT Education - 03/20/21 1230    Education Details HEP,POC,    Person(s) Educated Patient    Methods Explanation;Demonstration;Verbal cues;Handout    Comprehension Verbalized understanding;Returned demonstration            PT Short Term Goals - 03/20/21 1312      PT SHORT TERM GOAL #1   Title Pt will be I and compliant with HEP    Time 4    Period Weeks    Status New    Target Date 04/17/21             PT Long Term Goals - 03/20/21 1313      PT LONG TERM GOAL #1   Title Pt will improve FOTO to at least 60% functional score    Time 6    Period Weeks    Status New    Target Date 05/01/21      PT LONG TERM GOAL #2   Title Pt will improve Rt ankle strength to WNL.    Time 6    Period Weeks    Status New      PT LONG TERM GOAL #3   Title Pt will be able to maintain SLS at least 10 sec and tandem stance at least 20 seconds to show improved balance    Time 6    Period Weeks    Status New      PT LONG TERM GOAL #4   Title PT will be able to ambulate community distances on even and uneven terrrain and neavigate stairs with less than 2/10 overall Rt ankle/foot pain    Time 6    Period Weeks    Status New                  Plan - 03/20/21 1230    Clinical Impression Statement Pt presents with signs and symptoms of Rt ankle strain/spain after inversion type injury rolling her ankle 4 weeks ago. XR was negative for fractures but does show OA. MD impression is ankle sprain with exacerbation of degenerative changes. She will benefit from skilled PT to improve ankle ROM, strength, balance, proprioception, and address her functional deficits with pain during prolonged standing and walking.    Personal Factors and Comorbidities Comorbidity 3+    Comorbidities PMH: DM,CAD,OA,osteopenia    Examination-Activity Limitations Locomotion  Level;Squat;Lift;Stand;Stairs;Bend    Examination-Participation Restrictions Cleaning;Community Activity;Shop;Yard Work    Merchant navy officer Stable/Uncomplicated    Clinical Decision Making Low    Rehab Potential Good    PT Frequency 2x / week   1-2   PT Duration 6 weeks    PT Treatment/Interventions Cryotherapy;Electrical Stimulation;Iontophoresis 4mg /ml Dexamethasone;Moist Heat;Ultrasound;Gait training;Stair training;Therapeutic activities;Therapeutic exercise;Balance training;Neuromuscular re-education;Manual techniques;Passive range of motion;Joint Manipulations;Taping;Dry needling;Vasopneumatic Device    PT Next Visit Plan will be out of town 5/31 to 6/12, work on DF ROM, overall ankle strength and proprioception, weaning out of ASO brace as tolerated    PT Home Exercise Plan access code: [email or web address]           Patient will benefit from skilled therapeutic intervention in order to improve the following deficits and impairments:  Decreased activity tolerance,Decreased balance,Decreased mobility,Decreased range of motion,Decreased strength,Increased edema,Pain  Visit Diagnosis: Pain in right ankle and joints of right foot  Stiffness of right ankle, not elsewhere classified  Muscle weakness (generalized)  Difficulty in walking,  not elsewhere classified  Localized edema     Problem List Patient Active Problem List   Diagnosis Date Noted  . Urinary frequency 02/15/2021  . Hypertension 05/13/2019  . Urinary incontinence 01/19/2019  . Valvular heart disease   . Insulin pump in place   . Fibroid   . Osteoarthritis   . Displaced comminuted fracture of left patella, initial encounter for closed fracture 04/11/2017  . Routine general medical examination at a health care facility 11/08/2015  . Hyperlipidemia associated with type 2 diabetes mellitus ([place]) 03/16/2015  . 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 Uncontrolled type 1 diabetes  mellitus ([place]) 05/12/2013  . Chronic insomnia 03/25/2012  . Osteopenia   . Coronary artery disease 04/05/2011  . Atrial flutter ([place]) 04/05/2011    [name] 03/20/2021, 1:30 PM  [facility] [address] [place], [place], [address] Phone: [phone]   Fax:  [phone]  Name: [name] MRN: [id number] Date of Birth: 03/02/1946

## 2021-03-20 NOTE — Patient Instructions (Signed)
Access Code: [email or web address] URL: [email or web address] Date: 03/20/2021 Prepared by: Elsie Ra  Exercises Standing Gastroc Stretch - 2 x daily - 6 x weekly - 1 sets - 3 reps - 30 hold Standing Soleus Stretch - 2 x daily - 6 x weekly - 1 sets - 3 reps - 30 hold Standing Dorsiflexion Self-Mobilization on Step - 2 x daily - 6 x weekly - 1 sets - 10 reps - 10 hold Heel Toe Raises with Counter Support - 2 x daily - 6 x weekly - 2 sets - 10 reps Standing Tandem Balance with Counter Support - 2 x daily - 6 x weekly - 1 sets - 3 reps - 30 hold Side Stepping with Counter Support - 2 x daily - 6 x weekly - 1 sets - 5 reps

## 2021-03-22 ENCOUNTER — Ambulatory Visit (INDEPENDENT_AMBULATORY_CARE_PROVIDER_SITE_OTHER): Payer: Medicare Other | Admitting: Rehabilitative and Restorative Service Providers"

## 2021-03-22 ENCOUNTER — Encounter: Payer: Self-pay | Admitting: Rehabilitative and Restorative Service Providers"

## 2021-03-22 ENCOUNTER — Other Ambulatory Visit: Payer: Self-pay

## 2021-03-22 DIAGNOSIS — M25571 Pain in right ankle and joints of right foot: Secondary | ICD-10-CM

## 2021-03-22 DIAGNOSIS — R600 Localized edema: Secondary | ICD-10-CM

## 2021-03-22 DIAGNOSIS — M6281 Muscle weakness (generalized): Secondary | ICD-10-CM

## 2021-03-22 DIAGNOSIS — R262 Difficulty in walking, not elsewhere classified: Secondary | ICD-10-CM

## 2021-03-22 DIAGNOSIS — M25671 Stiffness of right ankle, not elsewhere classified: Secondary | ICD-10-CM

## 2021-03-22 NOTE — Therapy (Signed)
[facility] [address] [place], [place], [address] Phone: [phone]   Fax:  [phone]  Physical Therapy Treatment  Patient Details  Name: [name] MRN: [id number] Date of Birth: [birth year] Referring Provider (PT): Gregor Hams, MD   Encounter Date: 03/22/2021   PT End of Session - 03/22/21 1158    Visit Number 2    Number of Visits 12    Date for PT Re-Evaluation 05/01/21    PT Start Time 0845    PT Stop Time 0927    PT Time Calculation (min) 42 min    Activity Tolerance Patient tolerated treatment well;No increased pain    Behavior During Therapy WFL for tasks assessed/performed           Past Medical History:  Diagnosis Date  . Arthritis    OA  . Atrial flutter ([place])    a. s/p TEE/DCCV 08/16/13 (normal LV function, no LAA thrombus).b. s/p ablation by Dr Lovena Le 09-23-2013  . Coronary artery disease    2009 LAD Promus stent  . Dysrhythmia    Aflutter, no issues after ablation 2014  . Fibroid   . Hyperlipidemia   . Insulin pump in place   . Osteopenia   . Type 1 diabetes mellitus on insulin therapy ([place])   . Valvular heart disease    a. Mild MR/TR by TEE 08/2013.    Past Surgical History:  Procedure Laterality Date  . ABLATION  09-23-2013   CTI by Dr Lovena Le  . APPENDECTOMY    . APPLICATION OF WOUND VAC Left 04/11/2017   Procedure: APPLICATION OF WOUND VAC LEFT KNEE;  Surgeon: Rod Can, MD;  Location: [facility];  Service: Orthopedics;  Laterality: Left;  . ATRIAL FLUTTER ABLATION N/A 09/23/2013   Procedure: ATRIAL FLUTTER ABLATION;  Surgeon: Evans Lance, MD;  Location: [facility] CATH LAB;  Service: Cardiovascular;  Laterality: N/A;  . CARDIOVERSION N/A 08/16/2013   Procedure: CARDIOVERSION;  Surgeon: Thayer Headings, MD;  Location: [place];  Service: Cardiovascular;  Laterality: N/A;  spoke with [name]  . CATARACT EXTRACTION Bilateral   . COLONOSCOPY    . CORONARY ANGIOPLASTY WITH STENT PLACEMENT    .  MYOMECTOMY  1977  . ORIF PATELLA Left 04/11/2017  . ORIF PATELLA Left 04/11/2017   Procedure: OPEN REDUCTION INTERNAL (ORIF) FIXATION PATELLA LEFT KNEE;  Surgeon: Rod Can, MD;  Location: [place];  Service: Orthopedics;  Laterality: Left;  . PELVIC LAPAROSCOPY    . TEE WITHOUT CARDIOVERSION N/A 08/16/2013   Procedure: TRANSESOPHAGEAL ECHOCARDIOGRAM (TEE);  Surgeon: Thayer Headings, MD;  Location: [place];  Service: Cardiovascular;  Laterality: N/A;    There were no vitals filed for this visit.   Subjective Assessment - 03/22/21 0904    Subjective [name] reports early HEP compliance.    Pertinent History PMH: DM,CAD,OA,osteopenia    Limitations Lifting;Walking;Standing;House hold activities    How long can you stand comfortably? with brace can be on her feet 45 minutes in brace    How long can you walk comfortably? with brace 30 minutes    Diagnostic tests XR of Rt foot and ankle 03/08/21 "no fracture, some calcaneal and lateral mallolar spurring, Extensive calcaneal enthesophyte formation."    Currently in Pain? No/denies    Pain Score 0-No pain    Pain Location Ankle    Pain Orientation Right    Pain Descriptors / Indicators Aching    Pain Type Acute pain    Pain Onset More than a  month ago    Pain Frequency Intermittent    Aggravating Factors  Prolonged WB    Pain Relieving Factors Ice and anti-inflammatory medications    Multiple Pain Sites No                             [facility] Adult PT Treatment/Exercise - 03/22/21 0001      Neuro Re-ed    Neuro Re-ed Details  Tandem balance 5X 30 seconds      Exercises   Exercises Ankle      Ankle Exercises: Stretches   Soleus Stretch 3 reps;30 seconds    Gastroc Stretch 3 reps;30 seconds      Ankle Exercises: Standing   Other Standing Ankle Exercises Heel and toe raises 2 sets of 10 for 3 seconds      Ankle Exercises: Seated   Other Seated Ankle Exercises Theraband In/Ev [place] 2 sets of 10 each with slow  eccentrics                  PT Education - 03/22/21 0927    Education Details Reviewed HEP    Person(s) Educated Patient    Methods Explanation;Demonstration;Tactile cues;Verbal cues    Comprehension Verbal cues required;Returned demonstration;Need further instruction;Tactile cues required;Verbalized understanding            PT Short Term Goals - 03/22/21 0928      PT SHORT TERM GOAL #1   Title Pt will be I and compliant with HEP    Time 4    Period Weeks    Status On-going    Target Date 04/17/21             PT Long Term Goals - 03/22/21 0928      PT LONG TERM GOAL #1   Title Pt will improve FOTO to at least 60% functional score    Time 6    Period Weeks    Status On-going      PT LONG TERM GOAL #2   Title Pt will improve Rt ankle strength to WNL.    Time 6    Period Weeks    Status On-going      PT LONG TERM GOAL #3   Title Pt will be able to maintain SLS at least 10 sec and tandem stance at least 20 seconds to show improved balance    Time 6    Period Weeks    Status On-going      PT LONG TERM GOAL #4   Title PT will be able to ambulate community distances on even and uneven terrrain and neavigate stairs with less than 2/10 overall Rt ankle/foot pain    Time 6    Period Weeks    Status On-going                 Plan - 03/22/21 1158    Clinical Impression Statement [name] is doing a good job with her early HEP.  Corrections were needed and she was able to correctly complete her exercises by the end of the visit.  Added theraband inversion/eversion in the office and would like to add it to her HEP next visit if she is able to do a good job with it in the office next visit.  Continue strength and proprioception emphasis with AROM and flexibility as needed.    Personal Factors and Comorbidities Comorbidity 3+    Comorbidities PMH: DM,CAD,OA,osteopenia  Examination-Activity Limitations Locomotion Level;Squat;Lift;Stand;Stairs;Bend     Examination-Participation Restrictions Cleaning;Community Activity;Shop;Yard Work    Stability/Clinical Decision Making Stable/Uncomplicated    Rehab Potential Good    PT Frequency 2x / week   1-2   PT Duration 6 weeks    PT Treatment/Interventions Cryotherapy;Electrical Stimulation;Iontophoresis 4mg /ml Dexamethasone;Moist Heat;Ultrasound;Gait training;Stair training;Therapeutic activities;Therapeutic exercise;Balance training;Neuromuscular re-education;Manual techniques;Passive range of motion;Joint Manipulations;Taping;Dry needling;Vasopneumatic Device    PT Next Visit Plan will be out of town 5/31 to 6/12, work on DF ROM, overall ankle strength and proprioception, weaning out of ASO brace as tolerated    PT Home Exercise Plan access code: [email or web address]           Patient will benefit from skilled therapeutic intervention in order to improve the following deficits and impairments:  Decreased activity tolerance,Decreased balance,Decreased mobility,Decreased range of motion,Decreased strength,Increased edema,Pain  Visit Diagnosis: Muscle weakness (generalized)  Difficulty in walking, not elsewhere classified  Stiffness of right ankle, not elsewhere classified  Pain in right ankle and joints of right foot  Localized edema     Problem List Patient Active Problem List   Diagnosis Date Noted  . Urinary frequency 02/15/2021  . Hypertension 05/13/2019  . Urinary incontinence 01/19/2019  . Valvular heart disease   . Insulin pump in place   . Fibroid   . Osteoarthritis   . Displaced comminuted fracture of left patella, initial encounter for closed fracture 04/11/2017  . Routine general medical examination at a health care facility 11/08/2015  . Hyperlipidemia associated with type 2 diabetes mellitus ([place]) 03/16/2015  . 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 Uncontrolled type 1 diabetes mellitus ([place]) 05/12/2013  . Chronic insomnia 03/25/2012  . Osteopenia   .  Coronary artery disease 04/05/2011  . Atrial flutter ([place]) 04/05/2011    Farley Ly PT, MPT 03/22/2021, 12:01 PM  [facility] [address] [place], [place], [address] Phone: [phone]   Fax:  [phone]  Name: [name] MRN: [id number] Date of Birth: 1946/05/03

## 2021-03-27 ENCOUNTER — Encounter: Payer: Self-pay | Admitting: Physical Therapy

## 2021-03-27 ENCOUNTER — Ambulatory Visit (INDEPENDENT_AMBULATORY_CARE_PROVIDER_SITE_OTHER): Payer: Medicare Other | Admitting: Physical Therapy

## 2021-03-27 ENCOUNTER — Other Ambulatory Visit: Payer: Self-pay

## 2021-03-27 DIAGNOSIS — R600 Localized edema: Secondary | ICD-10-CM | POA: Diagnosis not present

## 2021-03-27 DIAGNOSIS — M25571 Pain in right ankle and joints of right foot: Secondary | ICD-10-CM | POA: Diagnosis not present

## 2021-03-27 DIAGNOSIS — R262 Difficulty in walking, not elsewhere classified: Secondary | ICD-10-CM

## 2021-03-27 DIAGNOSIS — M6281 Muscle weakness (generalized): Secondary | ICD-10-CM | POA: Diagnosis not present

## 2021-03-27 DIAGNOSIS — M25671 Stiffness of right ankle, not elsewhere classified: Secondary | ICD-10-CM

## 2021-03-27 NOTE — Therapy (Signed)
[facility] [address] [place], [place], [address] Phone: [phone]   Fax:  [phone]  Physical Therapy Treatment  Patient Details  Name: [name] MRN: [id number] Date of Birth: [birth year] Referring Provider (PT): Gregor Hams, MD   Encounter Date: 03/27/2021   PT End of Session - 03/27/21 1602    Visit Number 3    Number of Visits 12    Date for PT Re-Evaluation 05/01/21    PT Start Time 1600    PT Stop Time 1601    PT Time Calculation (min) 45 min    Activity Tolerance Patient tolerated treatment well;No increased pain    Behavior During Therapy WFL for tasks assessed/performed           Past Medical History:  Diagnosis Date  . Arthritis    OA  . Atrial flutter ([place])    a. s/p TEE/DCCV 08/16/13 (normal LV function, no LAA thrombus).b. s/p ablation by Dr Lovena Le 09-23-2013  . Coronary artery disease    2009 LAD Promus stent  . Dysrhythmia    Aflutter, no issues after ablation 2014  . Fibroid   . Hyperlipidemia   . Insulin pump in place   . Osteopenia   . Type 1 diabetes mellitus on insulin therapy ([place])   . Valvular heart disease    a. Mild MR/TR by TEE 08/2013.    Past Surgical History:  Procedure Laterality Date  . ABLATION  09-23-2013   CTI by Dr Lovena Le  . APPENDECTOMY    . APPLICATION OF WOUND VAC Left 04/11/2017   Procedure: APPLICATION OF WOUND VAC LEFT KNEE;  Surgeon: Rod Can, MD;  Location: [place];  Service: Orthopedics;  Laterality: Left;  . ATRIAL FLUTTER ABLATION N/A 09/23/2013   Procedure: ATRIAL FLUTTER ABLATION;  Surgeon: Evans Lance, MD;  Location: [facility] CATH LAB;  Service: Cardiovascular;  Laterality: N/A;  . CARDIOVERSION N/A 08/16/2013   Procedure: CARDIOVERSION;  Surgeon: Thayer Headings, MD;  Location: [place];  Service: Cardiovascular;  Laterality: N/A;  spoke with [name]  . CATARACT EXTRACTION Bilateral   . COLONOSCOPY    . CORONARY ANGIOPLASTY WITH STENT PLACEMENT    .  MYOMECTOMY  1977  . ORIF PATELLA Left 04/11/2017  . ORIF PATELLA Left 04/11/2017   Procedure: OPEN REDUCTION INTERNAL (ORIF) FIXATION PATELLA LEFT KNEE;  Surgeon: Rod Can, MD;  Location: [place];  Service: Orthopedics;  Laterality: Left;  . PELVIC LAPAROSCOPY    . TEE WITHOUT CARDIOVERSION N/A 08/16/2013   Procedure: TRANSESOPHAGEAL ECHOCARDIOGRAM (TEE);  Surgeon: Thayer Headings, MD;  Location: [place];  Service: Cardiovascular;  Laterality: N/A;    There were no vitals filed for this visit.   Subjective Assessment - 03/27/21 1559    Subjective Pt states that the ankle is much better. She states the walking is better. She is doing a few fitness classes and it is moving better. Pt reports no pain with HEP.    Pertinent History PMH: DM,CAD,OA,osteopenia    Limitations Lifting;Walking;Standing;House hold activities    How long can you stand comfortably? with brace can be on her feet 45 minutes in brace    How long can you walk comfortably? with brace 30 minutes    Diagnostic tests XR of Rt foot and ankle 03/08/21 "no fracture, some calcaneal and lateral mallolar spurring, Extensive calcaneal enthesophyte formation."    Currently in Pain? No/denies    Pain Score 0-No pain    Pain Onset More than a  month ago                             [facility] Adult PT Treatment/Exercise - 03/27/21 0001      Neuro Re-ed    Neuro Re-ed Details  --      Exercises   Exercises Ankle      Manual Therapy   Manual Therapy Joint mobilization   Joint Mobilization TCJ post mob grade III-IV; varying degrees of DF bias     Ankle Exercises: Stretches   Soleus Stretch 3 reps;30 seconds    Gastroc Stretch 3 reps;30 seconds      Ankle Exercises: Standing   Other Standing Ankle Exercises woodpecker 10x 3s;  tandem balance 30s 5x;  feet together balance on foam 30s 3x    Other Standing Ankle Exercises Heel and toe raises 2 sets of 10 for 3 seconds;  STS 15lbs KB 10x (towel under R foot)                             PT Short Term Goals - 03/22/21 0928      PT SHORT TERM GOAL #1   Title Pt will be I and compliant with HEP    Time 4    Period Weeks    Status On-going    Target Date 04/17/21             PT Long Term Goals - 03/22/21 0928      PT LONG TERM GOAL #1   Title Pt will improve FOTO to at least 60% functional score    Time 6    Period Weeks    Status On-going      PT LONG TERM GOAL #2   Title Pt will improve Rt ankle strength to WNL.    Time 6    Period Weeks    Status On-going      PT LONG TERM GOAL #3   Title Pt will be able to maintain SLS at least 10 sec and tandem stance at least 20 seconds to show improved balance    Time 6    Period Weeks    Status On-going      PT LONG TERM GOAL #4   Title PT will be able to ambulate community distances on even and uneven terrrain and neavigate stairs with less than 2/10 overall Rt ankle/foot pain    Time 6    Period Weeks    Status On-going                 Plan - 03/27/21 1615    Clinical Impression Statement Pt was able to progress R ankle strengthening and balance at today's session. Pt was able to introduce CKC isometrics and ankle loading without increased pain. Pt responded well to joint mob with report of increased flexibility and DF with gait. Pt demonstrates increased AROM DF following manual. Pt is progressing with rehab well but still has difficulty with medial lateral R ankle stability and joint stiffness. Pt would benefit from continued skilled therapy in order to reach goals and maximize functional R LE strength and ROM for return to regular community ambulation and activity.    Personal Factors and Comorbidities Comorbidity 3+    Comorbidities PMH: DM,CAD,OA,osteopenia    Examination-Activity Limitations Locomotion Level;Squat;Lift;Stand;Stairs;Bend    Examination-Participation Restrictions Cleaning;Community Activity;Shop;Yard Work    Multimedia programmer Stable/Uncomplicated  Rehab Potential Good    PT Frequency 2x / week   1-2   PT Duration 6 weeks    PT Treatment/Interventions Cryotherapy;Electrical Stimulation;Iontophoresis 4mg /ml Dexamethasone;Moist Heat;Ultrasound;Gait training;Stair training;Therapeutic activities;Therapeutic exercise;Balance training;Neuromuscular re-education;Manual techniques;Passive range of motion;Joint Manipulations;Taping;Dry needling;Vasopneumatic Device    PT Next Visit Plan will be out of town 5/31 to 6/12, work on DF ROM, overall ankle strength and proprioception, review woodpecker and STS    PT Home Exercise Plan access code: [email or web address]           Patient will benefit from skilled therapeutic intervention in order to improve the following deficits and impairments:  Decreased activity tolerance,Decreased balance,Decreased mobility,Decreased range of motion,Decreased strength,Increased edema,Pain  Visit Diagnosis: Difficulty in walking, not elsewhere classified  Muscle weakness (generalized)  Stiffness of right ankle, not elsewhere classified  Pain in right ankle and joints of right foot  Localized edema     Problem List Patient Active Problem List   Diagnosis Date Noted  . Urinary frequency 02/15/2021  . Hypertension 05/13/2019  . Urinary incontinence 01/19/2019  . Valvular heart disease   . Insulin pump in place   . Fibroid   . Osteoarthritis   . Displaced comminuted fracture of left patella, initial encounter for closed fracture 04/11/2017  . Routine general medical examination at a health care facility 11/08/2015  . Hyperlipidemia associated with type 2 diabetes mellitus ([place]) 03/16/2015  . 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 Uncontrolled type 1 diabetes mellitus ([place]) 05/12/2013  . Chronic insomnia 03/25/2012  . Osteopenia   . Coronary artery disease 04/05/2011  . Atrial flutter ([place]) 04/05/2011    Daleen Bo PT, DPT 03/27/21 4:36 PM    [facility] [address] [place], [place], [address] Phone: [phone]   Fax:  [phone]  Name: CHEYNA RETANA MRN: [id number] Date of Birth: 11-17-1945

## 2021-03-30 ENCOUNTER — Ambulatory Visit (INDEPENDENT_AMBULATORY_CARE_PROVIDER_SITE_OTHER): Payer: Medicare Other | Admitting: Rehabilitative and Restorative Service Providers"

## 2021-03-30 ENCOUNTER — Other Ambulatory Visit: Payer: Self-pay

## 2021-03-30 ENCOUNTER — Encounter: Payer: Self-pay | Admitting: Rehabilitative and Restorative Service Providers"

## 2021-03-30 DIAGNOSIS — M6281 Muscle weakness (generalized): Secondary | ICD-10-CM

## 2021-03-30 DIAGNOSIS — M25571 Pain in right ankle and joints of right foot: Secondary | ICD-10-CM

## 2021-03-30 DIAGNOSIS — R262 Difficulty in walking, not elsewhere classified: Secondary | ICD-10-CM

## 2021-03-30 DIAGNOSIS — R600 Localized edema: Secondary | ICD-10-CM | POA: Diagnosis not present

## 2021-03-30 DIAGNOSIS — M25671 Stiffness of right ankle, not elsewhere classified: Secondary | ICD-10-CM | POA: Diagnosis not present

## 2021-03-30 NOTE — Therapy (Signed)
[facility] Physical Therapy [address] [place], [place], [address] Phone: [phone]   Fax:  [phone]  Physical Therapy Treatment  Patient Details  Name: [name] MRN: [id number] Date of Birth: 1946/07/01 Referring Provider (PT): Gregor Hams, MD   Encounter Date: 03/30/2021   PT End of Session - 03/30/21 1618    Visit Number 4    Number of Visits 12    Date for PT Re-Evaluation 05/01/21    PT Start Time 1510    PT Stop Time 1600    PT Time Calculation (min) 50 min    Activity Tolerance Patient tolerated treatment well;No increased pain    Behavior During Therapy WFL for tasks assessed/performed           Past Medical History:  Diagnosis Date  . Arthritis    OA  . Atrial flutter ([place])    a. s/p TEE/DCCV 08/16/13 (normal LV function, no LAA thrombus).b. s/p ablation by Dr Lovena Le 09-23-2013  . Coronary artery disease    2009 LAD Promus stent  . Dysrhythmia    Aflutter, no issues after ablation 2014  . Fibroid   . Hyperlipidemia   . Insulin pump in place   . Osteopenia   . Type 1 diabetes mellitus on insulin therapy ([place])   . Valvular heart disease    a. Mild MR/TR by TEE 08/2013.    Past Surgical History:  Procedure Laterality Date  . ABLATION  09-23-2013   CTI by Dr Lovena Le  . APPENDECTOMY    . APPLICATION OF WOUND VAC Left 04/11/2017   Procedure: APPLICATION OF WOUND VAC LEFT KNEE;  Surgeon: Rod Can, MD;  Location: [place];  Service: Orthopedics;  Laterality: Left;  . ATRIAL FLUTTER ABLATION N/A 09/23/2013   Procedure: ATRIAL FLUTTER ABLATION;  Surgeon: Evans Lance, MD;  Location: [facility] CATH LAB;  Service: Cardiovascular;  Laterality: N/A;  . CARDIOVERSION N/A 08/16/2013   Procedure: CARDIOVERSION;  Surgeon: Thayer Headings, MD;  Location: [place];  Service: Cardiovascular;  Laterality: N/A;  spoke with [name]  . CATARACT EXTRACTION Bilateral   . COLONOSCOPY    . CORONARY ANGIOPLASTY WITH STENT PLACEMENT    .  MYOMECTOMY  1977  . ORIF PATELLA Left 04/11/2017  . ORIF PATELLA Left 04/11/2017   Procedure: OPEN REDUCTION INTERNAL (ORIF) FIXATION PATELLA LEFT KNEE;  Surgeon: Rod Can, MD;  Location: [place];  Service: Orthopedics;  Laterality: Left;  . PELVIC LAPAROSCOPY    . TEE WITHOUT CARDIOVERSION N/A 08/16/2013   Procedure: TRANSESOPHAGEAL ECHOCARDIOGRAM (TEE);  Surgeon: Thayer Headings, MD;  Location: [place];  Service: Cardiovascular;  Laterality: N/A;    There were no vitals filed for this visit.   Subjective Assessment - 03/30/21 1551    Subjective [name] reports good overall progress since starting PT.  She notices more pain today, possibly weather-related.  Limitations persist with prolonged standing and walking.    Pertinent History PMH: DM,CAD,OA,osteopenia    Limitations Lifting;Walking;Standing;House hold activities    How long can you stand comfortably? With brace can be on her feet 45 minutes    How long can you walk comfortably? With brace 30 minutes    Diagnostic tests XR of Rt foot and ankle 03/08/21 "no fracture, some calcaneal and lateral mallolar spurring, Extensive calcaneal enthesophyte formation."    Currently in Pain? Yes    Pain Score 5     Pain Location Ankle    Pain Orientation Right    Pain Descriptors /  Indicators Sharp    Pain Type Acute pain    Pain Radiating Towards NA    Pain Onset More than a month ago    Pain Frequency Intermittent    Aggravating Factors  Prolonged Kelley    Pain Relieving Factors Ice and anti-inflammatories    Effect of Pain on Daily Activities Limits her ability to be on her feet for prolonged periods of time    Multiple Pain Sites No                             [facility] Adult PT Treatment/Exercise - 03/30/21 0001      Neuro Re-ed    Neuro Re-ed Details  Tandem balance 6X 30 seconds      Exercises   Exercises Ankle      Ankle Exercises: Stretches   Soleus Stretch 3 reps;30 seconds    Gastroc Stretch 3 reps;30  seconds    Other Stretch Slant board Knees straight 3X 1 minute      Ankle Exercises: Standing   Other Standing Ankle Exercises Heel and toe raises 2 sets of 10 for 3 seconds      Ankle Exercises: Seated   Other Seated Ankle Exercises Theraband In/Ev Green 3 sets of 10 each with slow eccentrics                  PT Education - 03/30/21 1617    Education Details Reviewed HEP.  Feedback needed with heel cords stretching and theraband.  Added theraband to HEP and recommended considering a slant board for heel cords stretching at home.    Person(s) Educated Patient    Methods Explanation;Demonstration;Verbal cues    Comprehension Verbalized understanding;Returned demonstration;Verbal cues required;Need further instruction            PT Short Term Goals - 03/30/21 1618      PT SHORT TERM GOAL #1   Title Pt will be I and compliant with HEP    Time 4    Period Weeks    Status On-going    Target Date 04/17/21             PT Long Term Goals - 03/30/21 1618      PT LONG TERM GOAL #1   Title Pt will improve FOTO to at least 60% functional score    Time 6    Period Weeks    Status On-going      PT LONG TERM GOAL #2   Title Pt will improve Rt ankle strength to WNL.    Time 6    Period Weeks    Status On-going      PT LONG TERM GOAL #3   Title Pt will be able to maintain SLS at least 10 sec and tandem stance at least 20 seconds to show improved balance    Time 6    Period Weeks    Status On-going      PT LONG TERM GOAL #4   Title PT will be able to ambulate community distances on even and uneven terrrain and neavigate stairs with less than 2/10 overall Rt ankle/foot pain    Time 6    Period Weeks    Status On-going                 Plan - 03/30/21 1619    Clinical Impression Statement [name] notes overall progress, although today has been more painful with the front  that came through today.  Added a theraband to her HEP for inversion/eversion  strengthening and [name] required frequent feedback with her heel cord stretches to maintain a slight toe in position to avoid over-pronation.  Continue heel cords stretching, ankle strength and proprioceptive activities to improve [name] endurance and self-reported function.    Personal Factors and Comorbidities Comorbidity 3+    Comorbidities PMH: DM,CAD,OA,osteopenia    Examination-Activity Limitations Locomotion Level;Squat;Lift;Stand;Stairs;Bend    Examination-Participation Restrictions Cleaning;Community Activity;Shop;Yard Work    Stability/Clinical Decision Making Stable/Uncomplicated    Rehab Potential Good    PT Frequency 2x / week   1-2   PT Duration 6 weeks    PT Treatment/Interventions Cryotherapy;Electrical Stimulation;Iontophoresis 4mg /ml Dexamethasone;Moist Heat;Ultrasound;Gait training;Stair training;Therapeutic activities;Therapeutic exercise;Balance training;Neuromuscular re-education;Manual techniques;Passive range of motion;Joint Manipulations;Taping;Dry needling;Vasopneumatic Device    PT Next Visit Plan Will be out of town 5/31 to 6/12, work on DF ROM, overall ankle strength and proprioception, review woodpecker and STS    PT Home Exercise Plan access code: [email or web address]           Patient will benefit from skilled therapeutic intervention in order to improve the following deficits and impairments:  Decreased activity tolerance,Decreased balance,Decreased mobility,Decreased range of motion,Decreased strength,Increased edema,Pain  Visit Diagnosis: Difficulty in walking, not elsewhere classified  Muscle weakness (generalized)  Stiffness of right ankle, not elsewhere classified  Pain in right ankle and joints of right foot  Localized edema     Problem List Patient Active Problem List   Diagnosis Date Noted  . Urinary frequency 02/15/2021  . Hypertension 05/13/2019  . Urinary incontinence 01/19/2019  . Valvular heart disease   . Insulin pump in place   .  Fibroid   . Osteoarthritis   . Displaced comminuted fracture of left patella, initial encounter for closed fracture 04/11/2017  . Routine general medical examination at a health care facility 11/08/2015  . Hyperlipidemia associated with type 2 diabetes mellitus ([place]) 03/16/2015  . 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 Uncontrolled type 1 diabetes mellitus ([place]) 05/12/2013  . Chronic insomnia 03/25/2012  . Osteopenia   . Coronary artery disease 04/05/2011  . Atrial flutter ([place]) 04/05/2011    Farley Ly PT, MPT 03/30/2021, 4:22 PM  [facility] Physical Therapy [address] [place], [place], [address] Phone: [phone]   Fax:  [phone]  Name: NORVELLA LOSCALZO MRN: [id number] Date of Birth: 01-20-1946

## 2021-04-03 MED ORDER — NITROGLYCERIN 0.4 MG SL SUBL: 0.4000 mg | SUBLINGUAL_TABLET | SUBLINGUAL | 2 refills | Status: DC | PRN

## 2021-04-03 NOTE — Addendum Note (Signed)
Addended by: Carter Kitten D on: 04/03/2021 04:59 PM   Modules accepted: Orders

## 2021-04-17 ENCOUNTER — Ambulatory Visit (INDEPENDENT_AMBULATORY_CARE_PROVIDER_SITE_OTHER): Payer: Medicare Other | Admitting: Family Medicine

## 2021-04-17 ENCOUNTER — Other Ambulatory Visit: Payer: Self-pay

## 2021-04-17 ENCOUNTER — Encounter: Payer: Self-pay | Admitting: Family Medicine

## 2021-04-17 ENCOUNTER — Ambulatory Visit (INDEPENDENT_AMBULATORY_CARE_PROVIDER_SITE_OTHER): Payer: Medicare Other | Admitting: Physical Therapy

## 2021-04-17 VITALS — BP 122/70 | HR 69 | Ht 69.0 in | Wt 217.0 lb

## 2021-04-17 DIAGNOSIS — M6281 Muscle weakness (generalized): Secondary | ICD-10-CM

## 2021-04-17 DIAGNOSIS — R600 Localized edema: Secondary | ICD-10-CM | POA: Diagnosis not present

## 2021-04-17 DIAGNOSIS — I2510 Atherosclerotic heart disease of native coronary artery without angina pectoris: Secondary | ICD-10-CM

## 2021-04-17 DIAGNOSIS — M25571 Pain in right ankle and joints of right foot: Secondary | ICD-10-CM | POA: Diagnosis not present

## 2021-04-17 DIAGNOSIS — R262 Difficulty in walking, not elsewhere classified: Secondary | ICD-10-CM

## 2021-04-17 DIAGNOSIS — M25671 Stiffness of right ankle, not elsewhere classified: Secondary | ICD-10-CM | POA: Diagnosis not present

## 2021-04-17 NOTE — Therapy (Signed)
[facility] Physical Therapy [address] [place], [place], [address] Phone: [phone]   Fax:  [phone]  Physical Therapy Treatment/Discharge PHYSICAL THERAPY DISCHARGE SUMMARY  Visits from Start of Care: 5  Current functional level related to goals / functional outcomes: See below   Remaining deficits: See below   Education / Equipment: HEP Plan: Patient agrees to discharge.  Patient goals were met. Patient is being discharged due to meeting the stated rehab goals.        Patient Details  Name: [name] MRN: [id number] Date of Birth: Dec 30, 1945 Referring Provider (PT): Gregor Hams, MD   Encounter Date: 04/17/2021   PT End of Session - 04/17/21 1226     Visit Number 5    Number of Visits 12    Date for PT Re-Evaluation 05/01/21    PT Start Time 5427    PT Stop Time 1230    PT Time Calculation (min) 45 min    Activity Tolerance Patient tolerated treatment well;No increased pain    Behavior During Therapy WFL for tasks assessed/performed             Past Medical History:  Diagnosis Date   Arthritis    OA   Atrial flutter ([place])    a. s/p TEE/DCCV 08/16/13 (normal LV function, no LAA thrombus).b. s/p ablation by Dr Lovena Le 09-23-2013   Coronary artery disease    2009 LAD Promus stent   Dysrhythmia    Aflutter, no issues after ablation 2014   Fibroid    Hyperlipidemia    Insulin pump in place    Osteopenia    Type 1 diabetes mellitus on insulin therapy ([place])    Valvular heart disease    a. Mild MR/TR by TEE 08/2013.    Past Surgical History:  Procedure Laterality Date   ABLATION  09-23-2013   CTI by Dr Lovena Le   APPENDECTOMY     APPLICATION OF WOUND VAC Left 04/11/2017   Procedure: APPLICATION OF WOUND VAC LEFT KNEE;  Surgeon: Rod Can, MD;  Location: [place];  Service: Orthopedics;  Laterality: Left;   ATRIAL FLUTTER ABLATION N/A 09/23/2013   Procedure: ATRIAL FLUTTER ABLATION;  Surgeon: Evans Lance, MD;   Location: [facility] CATH LAB;  Service: Cardiovascular;  Laterality: N/A;   CARDIOVERSION N/A 08/16/2013   Procedure: CARDIOVERSION;  Surgeon: Thayer Headings, MD;  Location: [place];  Service: Cardiovascular;  Laterality: N/A;  spoke with Tom   CATARACT EXTRACTION Bilateral    COLONOSCOPY     CORONARY ANGIOPLASTY WITH STENT [place]   ORIF PATELLA Left 04/11/2017   ORIF PATELLA Left 04/11/2017   Procedure: OPEN REDUCTION INTERNAL (ORIF) FIXATION PATELLA LEFT KNEE;  Surgeon: Rod Can, MD;  Location: [place];  Service: Orthopedics;  Laterality: Left;   PELVIC LAPAROSCOPY     TEE WITHOUT CARDIOVERSION N/A 08/16/2013   Procedure: TRANSESOPHAGEAL ECHOCARDIOGRAM (TEE);  Surgeon: Thayer Headings, MD;  Location: [place];  Service: Cardiovascular;  Laterality: N/A;    There were no vitals filed for this visit.   Subjective Assessment - 04/17/21 1222     Subjective relays no pain or difficulty with usual activity and feels ready for discharge today.    Pertinent History PMH: DM,CAD,OA,osteopenia    Limitations Lifting;Walking;Standing;House hold activities    How long can you stand comfortably? With brace can be on her feet 45 minutes    How long can you walk comfortably? With brace 30 minutes  Diagnostic tests XR of Rt foot and ankle 03/08/21 "no fracture, some calcaneal and lateral mallolar spurring, Extensive calcaneal enthesophyte formation."    Pain Onset More than a month ago                [facility] PT Assessment - 04/17/21 0001       Assessment   Medical Diagnosis Rt ankle/foot pain    Referring Provider (PT) Gregor Hams, MD      Observation/Other Assessments   Focus on Therapeutic Outcomes (FOTO)  96% functional score update      AROM   Overall AROM Comments ankle ROM WFL      Strength   Overall Strength Comments WFL ankle strength                           [facility] Adult PT Treatment/Exercise - 04/17/21 0001       Neuro  Re-ed    Neuro Re-ed Details  tandem balance and SLS      Ankle Exercises: Stretches   Soleus Stretch 2 reps;30 seconds    Gastroc Stretch 2 reps;30 seconds    Other Stretch lunge stretch 5 sec x10      Ankle Exercises: Standing   Other Standing Ankle Exercises eccentric Rt heel raise X10 then Heel and toe raises 2 sets of 10 for 3 seconds      Ankle Exercises: Seated   Other Seated Ankle Exercises Theraband In/Ev Green 3 sets of 10 each with slow eccentrics                      PT Short Term Goals - 03/30/21 1618       PT SHORT TERM GOAL #1   Title Pt will be I and compliant with HEP    Time 4    Period Weeks    Status On-going    Target Date 04/17/21               PT Long Term Goals - 04/17/21 1227       PT LONG TERM GOAL #1   Title Pt will improve FOTO to at least 60% functional score    Baseline 96% today    Time 6    Period Weeks    Status Achieved      PT LONG TERM GOAL #2   Title Pt will improve Rt ankle strength to WNL.    Time 6    Period Weeks    Status Achieved      PT LONG TERM GOAL #3   Title Pt will be able to maintain SLS at least 10 sec and tandem stance at least 20 seconds to show improved balance    Time 6    Period Weeks    Status Achieved      PT LONG TERM GOAL #4   Title PT will be able to ambulate community distances on even and uneven terrrain and neavigate stairs with less than 2/10 overall Rt ankle/foot pain    Time 6    Period Weeks    Status Achieved                   Plan - 04/17/21 1228     Clinical Impression Statement She has done great with PT and had good follow up with MD. She has met all PT goals and is back to full functional activities. She will be discharged today  and she had no further questions or concerns.    Personal Factors and Comorbidities Comorbidity 3+    Comorbidities PMH: DM,CAD,OA,osteopenia    Examination-Activity Limitations Locomotion Level;Squat;Lift;Stand;Stairs;Bend     Examination-Participation Restrictions Cleaning;Community Activity;Shop;Yard Work    Stability/Clinical Decision Making Stable/Uncomplicated    Rehab Potential Good    PT Frequency 2x / week   1-2   PT Duration 6 weeks    PT Treatment/Interventions Cryotherapy;Electrical Stimulation;Iontophoresis 41m/ml Dexamethasone;Moist Heat;Ultrasound;Gait training;Stair training;Therapeutic activities;Therapeutic exercise;Balance training;Neuromuscular re-education;Manual techniques;Passive range of motion;Joint Manipulations;Taping;Dry needling;Vasopneumatic Device    PT Next Visit Plan DC    PT Home Exercise Plan access code: [email or web address]            Patient will benefit from skilled therapeutic intervention in order to improve the following deficits and impairments:  Decreased activity tolerance, Decreased balance, Decreased mobility, Decreased range of motion, Decreased strength, Increased edema, Pain  Visit Diagnosis: Difficulty in walking, not elsewhere classified  Muscle weakness (generalized)  Stiffness of right ankle, not elsewhere classified  Pain in right ankle and joints of right foot  Localized edema     Problem List Patient Active Problem List   Diagnosis Date Noted   Urinary frequency 02/15/2021   Hypertension 05/13/2019   Urinary incontinence 01/19/2019   Valvular heart disease    Insulin pump in place    Fibroid    Osteoarthritis    Displaced comminuted fracture of left patella, initial encounter for closed fracture 04/11/2017   Routine general medical examination at a health care facility 11/08/2015   Hyperlipidemia associated with type 2 diabetes mellitus ([place] 03/16/2015   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

## 2021-04-17 NOTE — Patient Instructions (Signed)
Thank you for coming in today.   Continue PT and finish it up soon.   Return as needed.   OK to use that brace with heavy duty activity like hiking or walking in the woods.

## 2021-04-17 NOTE — Progress Notes (Signed)
I, Peterson Lombard, LAT, ATC acting as a scribe for Lynne Leader, MD.  [name] is a 75 y.o. female who presents to [facility] today for f/u R ankle pain. MOI: In April, pt rolled her ankle into INV while on some rocks at a local nursery and pain lingered for a month+. Pt was last seen by Dr. Georgina Snell on 03/08/21 and was advised to use an ankle brace and/or CAM walker boot and Voltaren gel. Pt was also referred to PT and has completed a total of 4 visits. Today, pt reports that her foot is doing really great. PT is helping and has not needed to use her ankle brace.   She has an upcoming [place] cruise in a few weeks that she is looking forward to.  Dx imaging: 03/08/21 R foot & ankle XR   Pertinent review of systems: No fevers or chills  Relevant historical information: Diabetes   Exam:  BP 122/70 (BP Location: Left Arm, Patient Position: Sitting, Cuff Size: Normal)   Pulse 69   Ht 5\' 9"  (1.753 m)   Wt 217 lb (98.4 kg)   SpO2 97%   BMI 32.05 kg/m  General: Well Developed, well nourished, and in no acute distress.   MSK: Right ankle normal-appearing nontender normal motion stable ligamentous exam intact strength.    Lab and Radiology Results  DG Ankle Complete Right  Result Date: 03/11/2021 CLINICAL DATA:  RIGHT ankle pain for 1 month after rolling her ankle/inversion injury while walking on some rocks, lateral ankle pain radiating to medial ankle EXAM: RIGHT ANKLE - COMPLETE 3+ VIEW COMPARISON:  None FINDINGS: Osseous demineralization. Joint spaces preserved. Significant soft tissue swelling. Minimal spurring at the tip of lateral malleolus. Small corticated ossicle adjacent to tip of medial malleolus. Plantar and Achilles insertion calcaneal spurs. No acute fracture, dislocation, or bone destruction. IMPRESSION: No acute osseous abnormalities. Calcaneal and lateral malleolar spurring. Electronically Signed   By: Lavonia Dana M.D.   On: 03/11/2021 18:22    DG Foot Complete Right  Result Date: 03/11/2021 CLINICAL DATA:  Lateral right ankle pain following an injury 1 month ago. EXAM: RIGHT FOOT COMPLETE - 3+ VIEW COMPARISON:  Right ankle radiographs obtained at the same time. FINDINGS: Extensive calcaneal enthesophyte formation. No fracture or dislocation. IMPRESSION: No fracture. Electronically Signed   By: Claudie Revering M.D.   On: 03/11/2021 18:19   [place] LIMITED JOINT SPACE STRUCTURES LOW RIGHT(NO LINKED CHARGES)  Result Date: 03/20/2021   Diagnostic Limited MSK Ultrasound of: Right ankle Joint effusion lateral joint line No acute fractures visible. Impression: Joint effusion  I, Lynne Leader, personally (independently) visualized and performed the interpretation of the xray images attached in this note.    Assessment and Plan: 75 y.o. female with right ankle pain after ankle sprain occurred about 6 weeks ago.  Patient is improving with physical therapy.  Plan to finish out physical therapy and continue home exercise program.  Advance activity as tolerated.  Recommend using ankle brace with heavy duty activity especially on her shore based activities during the [place] cruise.  Recheck with me as needed.     Discussed warning signs or symptoms. Please see discharge instructions. Patient expresses understanding.   The above documentation has been reviewed and is accurate and complete Lynne Leader, M.D.  Total encounter time 20 minutes including face-to-face time with the patient and, reviewing past medical record, and charting on the date of service.   Treatment plan and  options

## 2021-04-19 ENCOUNTER — Ambulatory Visit: Payer: Medicare Other | Admitting: Podiatry

## 2021-04-19 ENCOUNTER — Encounter: Payer: Medicare Other | Admitting: Physical Therapy

## 2021-04-19 ENCOUNTER — Encounter: Payer: Self-pay | Admitting: Podiatry

## 2021-04-19 ENCOUNTER — Ambulatory Visit (INDEPENDENT_AMBULATORY_CARE_PROVIDER_SITE_OTHER): Payer: Medicare Other | Admitting: Podiatry

## 2021-04-19 ENCOUNTER — Other Ambulatory Visit: Payer: Self-pay

## 2021-04-19 DIAGNOSIS — I2510 Atherosclerotic heart disease of native coronary artery without angina pectoris: Secondary | ICD-10-CM

## 2021-04-19 DIAGNOSIS — M2041 Other hammer toe(s) (acquired), right foot: Secondary | ICD-10-CM | POA: Diagnosis not present

## 2021-04-19 DIAGNOSIS — M779 Enthesopathy, unspecified: Secondary | ICD-10-CM

## 2021-04-19 MED ORDER — TRIAMCINOLONE ACETONIDE 10 MG/ML IJ SUSP
10.0000 mg | Freq: Once | INTRAMUSCULAR | Status: AC
  Administered 2021-04-19: 10 mg

## 2021-04-19 NOTE — Progress Notes (Signed)
Subjective:   Patient ID: [name], female   DOB: 75 y.o.   MRN: [id number]   HPI Patient presents stating the right fifth toe has really started to bother her again and she states that it is getting hard to wear shoe gear with comfortably.  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  She wants surgery understanding risk and is scheduled for surgery mid July encouraged to call with questions prior to procedure

## 2021-04-23 ENCOUNTER — Other Ambulatory Visit (HOSPITAL_COMMUNITY): Payer: Self-pay

## 2021-04-24 ENCOUNTER — Encounter: Payer: Medicare Other | Admitting: Physical Therapy

## 2021-04-26 ENCOUNTER — Encounter: Payer: Medicare Other | Admitting: Physical Therapy

## 2021-04-30 ENCOUNTER — Other Ambulatory Visit: Payer: Self-pay

## 2021-04-30 ENCOUNTER — Encounter: Payer: Medicare Other | Admitting: Physical Therapy

## 2021-04-30 DIAGNOSIS — E785 Hyperlipidemia, unspecified: Secondary | ICD-10-CM

## 2021-04-30 MED ORDER — PIOGLITAZONE HCL 15 MG PO TABS: 30.0000 mg | ORAL_TABLET | Freq: Every day | ORAL | 3 refills | Status: DC

## 2021-05-08 ENCOUNTER — Other Ambulatory Visit: Payer: Self-pay

## 2021-05-08 DIAGNOSIS — I10 Essential (primary) hypertension: Secondary | ICD-10-CM

## 2021-05-08 DIAGNOSIS — E782 Mixed hyperlipidemia: Secondary | ICD-10-CM

## 2021-05-08 MED ORDER — PIOGLITAZONE HCL 30 MG PO TABS: 30.0000 mg | ORAL_TABLET | Freq: Every day | ORAL | 3 refills | Status: DC

## 2021-05-11 ENCOUNTER — Other Ambulatory Visit: Payer: Self-pay

## 2021-05-11 MED ORDER — CLOPIDOGREL BISULFATE 75 MG PO TABS: 75.0000 mg | ORAL_TABLET | Freq: Every day | ORAL | 1 refills | Status: DC

## 2021-05-14 ENCOUNTER — Ambulatory Visit (INDEPENDENT_AMBULATORY_CARE_PROVIDER_SITE_OTHER): Payer: Medicare Other

## 2021-05-14 ENCOUNTER — Other Ambulatory Visit: Payer: Self-pay

## 2021-05-14 VITALS — BP 116/70 | HR 72 | Temp 98.2°F | Ht 69.0 in | Wt 212.8 lb

## 2021-05-14 DIAGNOSIS — Z0000 Encounter for general adult medical examination without abnormal findings: Secondary | ICD-10-CM

## 2021-05-14 DIAGNOSIS — Z1231 Encounter for screening mammogram for malignant neoplasm of breast: Secondary | ICD-10-CM | POA: Diagnosis not present

## 2021-05-14 NOTE — Progress Notes (Signed)
Subjective:   [name] is a 75 y.o. female who presents for Medicare Annual (Subsequent) preventive examination.  Review of Systems     Cardiac Risk Factors include: advanced age (>34men, >35 women);diabetes mellitus;dyslipidemia;family history of premature cardiovascular disease;hypertension;obesity (BMI >30kg/m2)     Objective:    Today's Vitals   05/14/21 1238 05/14/21 1247  BP:  116/70  Pulse:  72  Temp:  98.2 F (36.8 C)  SpO2:  98%  Weight:  212 lb 12.8 oz (96.5 kg)  Height:  5\' 9"  (1.753 m)  PainSc: 0-No pain 0-No pain   Body mass index is 31.43 kg/m.  Advanced Directives 05/14/2021 03/20/2021 04/20/2018 04/11/2017 03/22/2017 07/08/2014 09/23/2013  Does Patient Have a Medical Advance Directive? Yes Yes Yes Yes No No Patient does not have advance directive  Type of Advance Directive [place];Living will [place];Living will - [place];Living will - - -  Does patient want to make changes to medical advance directive? No - Patient declined No - Patient declined No - Patient declined No - Patient declined - - -  Copy of [place] in Chart? No - copy requested No - copy requested - No - copy requested - - -  Pre-existing out of facility DNR order (yellow form or pink MOST form) - - - - - - -    Current Medications (verified) Outpatient Encounter Medications as of 05/14/2021  Medication Sig   Calcium Carbonate-Vitamin D (CALCIUM + D PO) Take 600 mg by mouth 2 (two) times daily.   cholecalciferol (VITAMIN D) 1000 units tablet Take 1,000 Units by mouth 2 (two) times daily.    clopidogrel (PLAVIX) 75 MG tablet Take 1 tablet (75 mg total) by mouth daily.   Continuous Blood Gluc Sensor (DEXCOM G6 SENSOR) MISC by Does not apply route.   ezetimibe (ZETIA) 10 MG tablet TAKE 1 TABLET DAILY   furosemide (LASIX) 20 MG tablet Take 1 tablet (20 mg total) by mouth as needed. Take 1 tablet by mouth as needed for  5 days.   ibuprofen (ADVIL) 200 MG tablet Take 200 mg by mouth every 6 (six) hours as needed.   Insulin Lispro-aabc (LYUMJEV) 100 UNIT/ML SOLN Use max of 90 units daily via insulin pump. (Patient taking differently: Use max of 250 units daily via insulin pump.)   losartan (COZAAR) 25 MG tablet TAKE 1 TABLET DAILY   metFORMIN (GLUCOPHAGE-XR) 500 MG 24 hr tablet TAKE 1 TABLET IN THE MORNING AND 2 TABLETS IN THE EVENING AS DIRECTED   metoprolol succinate (TOPROL-XL) 100 MG 24 hr tablet TAKE 1 TABLET DAILY   nitroGLYCERIN (NITROSTAT) 0.4 MG SL tablet Place 1 tablet (0.4 mg total) under the tongue every 5 (five) minutes as needed for chest pain.   pioglitazone (ACTOS) 30 MG tablet Take 1 tablet (30 mg total) by mouth daily.   potassium chloride (K-DUR) 10 MEQ tablet Take 1 tablet (10 mEq total) by mouth daily as needed (take with Lasix).   rosuvastatin (CRESTOR) 40 MG tablet TAKE 1 TABLET DAILY   No facility-administered encounter medications on file as of 05/14/2021.    Allergies (verified) Sulfa antibiotics   History: Past Medical History:  Diagnosis Date   Arthritis    OA   Atrial flutter ([place])    a. s/p TEE/DCCV 08/16/13 (normal LV function, no LAA thrombus).b. s/p ablation by Dr Lovena Le 09-23-2013   Coronary artery disease    2009 LAD Promus stent  Dysrhythmia    Aflutter, no issues after ablation 2014   Fibroid    Hyperlipidemia    Insulin pump in place    Osteopenia    Type 1 diabetes mellitus on insulin therapy ([place])    Valvular heart disease    a. Mild MR/TR by TEE 08/2013.   Past Surgical History:  Procedure Laterality Date   ABLATION  09-23-2013   CTI by Dr Lovena Le   APPENDECTOMY     APPLICATION OF WOUND VAC Left 04/11/2017   Procedure: APPLICATION OF WOUND VAC LEFT KNEE;  Surgeon: Rod Can, MD;  Location: [place];  Service: Orthopedics;  Laterality: Left;   ATRIAL FLUTTER ABLATION N/A 09/23/2013   Procedure: ATRIAL FLUTTER ABLATION;  Surgeon: Evans Lance, MD;   Location: [facility] CATH LAB;  Service: Cardiovascular;  Laterality: N/A;   CARDIOVERSION N/A 08/16/2013   Procedure: CARDIOVERSION;  Surgeon: Thayer Headings, MD;  Location: [place];  Service: Cardiovascular;  Laterality: N/A;  spoke with Tom   CATARACT EXTRACTION Bilateral    COLONOSCOPY     CORONARY ANGIOPLASTY WITH STENT [place]   ORIF PATELLA Left 04/11/2017   ORIF PATELLA Left 04/11/2017   Procedure: OPEN REDUCTION INTERNAL (ORIF) FIXATION PATELLA LEFT KNEE;  Surgeon: Rod Can, MD;  Location: [place];  Service: Orthopedics;  Laterality: Left;   PELVIC LAPAROSCOPY     TEE WITHOUT CARDIOVERSION N/A 08/16/2013   Procedure: TRANSESOPHAGEAL ECHOCARDIOGRAM (TEE);  Surgeon: Thayer Headings, MD;  Location: [facility];  Service: Cardiovascular;  Laterality: N/A;   Family History  Problem Relation Age of Onset   CAD Father 68       died age 76   CAD Brother 32       small vessel disease   Atrial fibrillation Mother    Diabetes Mother    Hypertension Mother    CAD Cousin        paternal   CAD Paternal Grandfather        early onset   Colon cancer Neg Hx    Social History   Socioeconomic History   Marital status: Married    Spouse name: Not on file   Number of children: 2   Years of education: Not on file   Highest education level: Not on file  Occupational History   Occupation: home maker    Comment: Children adopted  Tobacco Use   Smoking status: Never   Smokeless tobacco: Never  Vaping Use   Vaping Use: Never used  Substance and Sexual Activity   Alcohol use: Yes    Alcohol/week: 5.0 standard drinks    Types: 5 Glasses of wine per week    Comment: on weekends   Drug use: No   Sexual activity: Yes    Birth control/protection: Post-menopausal  Other Topics Concern   Not on file  Social History Narrative   Not on file   Social Determinants of Health   Financial Resource Strain: Low Risk    Difficulty of Paying Living Expenses: Not hard  at all  Food Insecurity: Not on file  Transportation Needs: Not on file  Physical Activity: Not on file  Stress: Not on file  Social Connections: Socially Integrated   Frequency of Communication with Friends and Family: More than three times a week   Frequency of Social Gatherings with Friends and Family: Twice a week   Attends Religious Services: More than 4 times per year   Active Member of  Clubs or Organizations: Yes   Attends Music therapist: More than 4 times per year   Marital Status: Married    Tobacco Counseling Counseling given: Not Answered   Clinical Intake:  Pre-visit preparation completed: Yes  Pain : No/denies pain Pain Score: 0-No pain     BMI - recorded: 31.43 Nutritional Status: BMI > 30  Obese Nutritional Risks: None Diabetes: Yes CBG done?: No Did pt. bring in CBG monitor from home?: No  How often do you need to have someone help you when you read instructions, pamphlets, or other written materials from your doctor or pharmacy?: 1 - Never What is the last grade level you completed in school?: Bachelor's Degree  Diabetic? yes  Interpreter Needed?: No  Information entered by :: Lisette Abu, LPN   Activities of Daily Living In your present state of health, do you have any difficulty performing the following activities: 05/14/2021 02/14/2021  Hearing? Y N  Vision? N N  Difficulty concentrating or making decisions? N N  Walking or climbing stairs? N N  Dressing or bathing? N N  Doing errands, shopping? N N  Preparing Food and eating ? N -  Using the Toilet? N -  In the past six months, have you accidently leaked urine? Y -  Do you have problems with loss of bowel control? N -  Managing your Medications? N -  Managing your Finances? N -  Housekeeping or managing your Housekeeping? N -  Some recent data might be hidden    Patient Care Team: Hoyt Koch, MD as PCP - General (Internal Medicine) [name], Wonda Cheng, MD  as PCP - Cardiology (Cardiology) Elayne Snare, MD (Endocrinology) [name], Wonda Cheng, MD (Cardiology) Lindwood Coke, MD (Dermatology) Evans Lance, MD (Cardiology) Lafayette Dragon, MD (Inactive) (Gastroenterology) Lyndal Pulley, DO (Sports Medicine) Charlton Haws, [facility] as Pharmacist (Pharmacist) [name], Heather, OD as Consulting Physician (Optometry)  Indicate any recent Medical Services you may have received from other than [facility] providers in the past year (date may be approximate).     Assessment:   This is a routine wellness examination for Loryn.  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 Vision Screening - Comments:: Eye exam every year by Dr. Heather [name].  Dietary issues and exercise activities discussed: Current Exercise Habits: Home exercise routine, Type of exercise: walking, Time (Minutes): 30, Frequency (Times/Week): 5, Weekly Exercise (Minutes/Week): 150, Intensity: Moderate, Exercise limited by: None identified   Goals Addressed   None   Depression Screen PHQ 2/9 Scores 05/14/2021 02/14/2021 01/20/2020 04/20/2018 11/13/2017 11/08/2015 11/07/2014  PHQ - 2 Score 0 0 1 0 0 0 0    Fall Risk Fall Risk  05/14/2021 01/19/2021 01/20/2020 04/20/2018 11/13/2017  Falls in the past year? 0 0 0 Yes Yes  Number falls in past yr: 0 0 0 2 or more 2 or more  Comment - - - broke knee cap and broke ankle -  Injury with Fall? 0 0 0 Yes Yes  Risk for fall due to : No Fall Risks - - - -  Follow up Falls evaluation completed - - - -    FALL RISK PREVENTION PERTAINING TO THE HOME:  Any stairs in or around the home? Yes  If so, are there any without handrails? No  Home free of loose throw rugs in walkways, pet beds, electrical cords, etc? Yes  Adequate lighting in your home to reduce  risk of falls? Yes   ASSISTIVE DEVICES UTILIZED TO PREVENT FALLS:  Life alert? No  Use of a cane, walker or w/c? No  Grab bars in the  bathroom? Yes  Shower chair or bench in shower? Yes  Elevated toilet seat or a handicapped toilet? No   TIMED UP AND GO:  Was the test performed? Yes .  Length of time to ambulate 10 feet: 6 sec.   Gait steady and fast without use of assistive device  Cognitive Function: Normal cognitive status assessed by direct observation by this Nurse Health Advisor. No abnormalities found.          Immunizations Immunization History  Administered Date(s) Administered   Hep A / Hep B 06/13/2016   Influenza Split 08/04/2012   Influenza, High Dose Seasonal PF 07/18/2016, 08/12/2017, 07/28/2018, 07/28/2018   Influenza,inj,Quad PF,6+ Mos 08/09/2013, 08/11/2014   Influenza-Unspecified 08/16/2015, 08/13/2020   PFIZER(Purple Top)SARS-COV-2 Vaccination 11/23/2019, 12/14/2019, 07/18/2020, 03/04/2021   Pneumococcal Conjugate-13 11/07/2014   Pneumococcal Polysaccharide-23 11/05/2004, 09/24/2013   Td 01/14/1997   Tdap 03/25/2012   Zoster Recombinat (Shingrix) 08/18/2018, 10/22/2018   Zoster, Live 11/04/2010    TDAP status: Up to date  Flu Vaccine status: Up to date  Pneumococcal vaccine status: Up to date  Covid-19 vaccine status: Completed vaccines  Qualifies for Shingles Vaccine? Yes   Zostavax completed Yes   Shingrix Completed?: Yes  Screening Tests Health Maintenance  Topic Date Due   OPHTHALMOLOGY EXAM  09/08/2020   MAMMOGRAM  04/27/2021   INFLUENZA VACCINE  06/04/2021   HEMOGLOBIN A1C  08/30/2021   FOOT EXAM  01/19/2022   TETANUS/TDAP  03/25/2022   COLONOSCOPY (Pts 45-81yrs Insurance coverage will need to be confirmed)  07/08/2024   DEXA SCAN  Completed   COVID-19 Vaccine  Completed   Hepatitis C Screening  Completed   PNA vac Low Risk Adult  Completed   Zoster Vaccines- Shingrix  Completed   HPV VACCINES  Aged Out    Health Maintenance  Health Maintenance Due  Topic Date Due   OPHTHALMOLOGY EXAM  09/08/2020   MAMMOGRAM  04/27/2021    Colorectal cancer screening:  Type of screening: Colonoscopy. Completed 07/08/2014. Repeat every 10 years  Mammogram status: Completed 05/14/2021. Repeat every year  Bone Density status: Completed 11/21/2014. Results reflect: Bone density results: OSTEOPENIA. Repeat every 2 years.  Lung Cancer Screening: (Low Dose CT Chest recommended if Age 73-80 years, 30 pack-year currently smoking OR have quit w/in 15years.) does not qualify.   Lung Cancer Screening Referral: no  Additional Screening:  Hepatitis C Screening: does qualify; Completed: yes  Vision Screening: Recommended annual ophthalmology exams for early detection of glaucoma and other disorders of the eye. Is the patient up to date with their annual eye exam?  Yes  Who is the provider or what is the name of the office in which the patient attends annual eye exams? Heather Oma, OD. If pt is not established with a provider, would they like to be referred to a provider to establish care? No .   Dental Screening: Recommended annual dental exams for proper oral hygiene  Community Resource Referral / Chronic Care Management: CRR required this visit?  No   CCM required this visit?  No      Plan:     I have personally reviewed and noted the following in the patient's chart:   Medical and social history Use of alcohol, tobacco or illicit drugs  Current medications and supplements including opioid prescriptions.  Functional ability and status Nutritional status Physical activity Advanced directives List of other physicians Hospitalizations, surgeries, and ER visits in previous 12 months Vitals Screenings to include cognitive, depression, and falls Referrals and appointments  In addition, I have reviewed and discussed with patient certain preventive protocols, quality metrics, and best practice recommendations. A written personalized care plan for preventive services as well as general preventive health recommendations were provided to patient.     Sheral Flow, LPN   5/46/2703   Nurse Notes: n/a

## 2021-05-14 NOTE — Patient Instructions (Signed)
[name] , Thank you for taking time to come for your Medicare Wellness Visit. I appreciate your ongoing commitment to your health goals. Please review the following plan we discussed and let me know if I can assist you in the future.   Screening recommendations/referrals: Colonoscopy: 07/08/2014; due every 10 years Mammogram: 04/28/2020; due every 2 years Bone Density: 11/21/2014; discontinued Recommended yearly ophthalmology/optometry visit for glaucoma screening and checkup Recommended yearly dental visit for hygiene and checkup  Vaccinations: Influenza vaccine: 08/13/2020 Pneumococcal vaccine: 09/24/2013, 11/07/2014 Tdap vaccine: 03/25/2012; due every 10 years Shingles vaccine: 08/18/2018, 10/22/2018   Covid-19: 11/23/2019, 12/14/2019, 07/18/2020, 03/04/2021  Advanced directives: Please bring a copy of your health care power of attorney and living will to the office at your convenience.  Conditions/risks identified: Yes.  Goals: Recommend to drink at least 6-8 8oz glasses of water per day.   Recommend to exercise for at least 150 minutes per week.   Recommend to remove any items from the home that may cause slips or trips.   Recommend to decrease portion sizes by eating 3 small healthy meals and at least 2 healthy snacks per day.   Recommend to begin DASH diet as directed below   Next appointment: Please schedule your next Medicare Wellness Visit with your Nurse Health Advisor in 1 year by calling [phone].   Preventive Care 60 Years and Older, Female Preventive care refers to lifestyle choices and visits with your health care provider that can promote health and wellness. What does preventive care include? A yearly physical exam. This is also called an annual well check. Dental exams once or twice a year. Routine eye exams. Ask your health care provider how often you should have your eyes checked. Personal lifestyle choices, including: Daily care of your teeth and  gums. Regular physical activity. Eating a healthy diet. Avoiding tobacco and drug use. Limiting alcohol use. Practicing safe sex. Taking low-dose aspirin every day. Taking vitamin and mineral supplements as recommended by your health care provider. What happens during an annual well check? The services and screenings done by your health care provider during your annual well check will depend on your age, overall health, lifestyle risk factors, and family history of disease. Counseling  Your health care provider may ask you questions about your: Alcohol use. Tobacco use. Drug use. Emotional well-being. Home and relationship well-being. Sexual activity. Eating habits. History of falls. Memory and ability to understand (cognition). Work and work Statistician. Reproductive health. Screening  You may have the following tests or measurements: Height, weight, and BMI. Blood pressure. Lipid and cholesterol levels. These may be checked every 5 years, or more frequently if you are over 51 years old. Skin check. Lung cancer screening. You may have this screening every year starting at age 73 if you have a 30-pack-year history of smoking and currently smoke or have quit within the past 15 years. Fecal occult blood test (FOBT) of the stool. You may have this test every year starting at age 50. Flexible sigmoidoscopy or colonoscopy. You may have a sigmoidoscopy every 5 years or a colonoscopy every 10 years starting at age 80. Hepatitis C blood test. Hepatitis B blood test. Sexually transmitted disease (STD) testing. Diabetes screening. This is done by checking your blood sugar (glucose) after you have not eaten for a while (fasting). You may have this done every 1-3 years. Bone density scan. This is done to screen for osteoporosis. You may have this done starting at age 34. Mammogram. This may  be done every 1-2 years. Talk to your health care provider about how often you should have regular  mammograms. Talk with your health care provider about your test results, treatment options, and if necessary, the need for more tests. Vaccines  Your health care provider may recommend certain vaccines, such as: Influenza vaccine. This is recommended every year. Tetanus, diphtheria, and acellular pertussis (Tdap, Td) vaccine. You may need a Td booster every 10 years. Zoster vaccine. You may need this after age 39. Pneumococcal 13-valent conjugate (PCV13) vaccine. One dose is recommended after age 62. Pneumococcal polysaccharide (PPSV23) vaccine. One dose is recommended after age 55. Talk to your health care provider about which screenings and vaccines you need and how often you need them. This information is not intended to replace advice given to you by your health care provider. Make sure you discuss any questions you have with your health care provider. Document Released: 11/17/2015 Document Revised: 07/10/2016 Document Reviewed: 08/22/2015 Elsevier Interactive Patient Education  2017 [place] Prevention in the Home Falls can cause injuries. They can happen to people of all ages. There are many things you can do to make your home safe and to help prevent falls. What can I do on the outside of my home? Regularly fix the edges of walkways and driveways and fix any cracks. Remove anything that might make you trip as you walk through a door, such as a raised step or threshold. Trim any bushes or trees on the path to your home. Use bright outdoor lighting. Clear any walking paths of anything that might make someone trip, such as rocks or tools. Regularly check to see if handrails are loose or broken. Make sure that both sides of any steps have handrails. Any raised decks and porches should have guardrails on the edges. Have any leaves, snow, or ice cleared regularly. Use sand or salt on walking paths during winter. Clean up any spills in your garage right away. This includes oil  or grease spills. What can I do in the bathroom? Use night lights. Install grab bars by the toilet and in the tub and shower. Do not use towel bars as grab bars. Use non-skid mats or decals in the tub or shower. If you need to sit down in the shower, use a plastic, non-slip stool. Keep the floor dry. Clean up any water that spills on the floor as soon as it happens. Remove soap buildup in the tub or shower regularly. Attach bath mats securely with double-sided non-slip rug tape. Do not have throw rugs and other things on the floor that can make you trip. What can I do in the bedroom? Use night lights. Make sure that you have a light by your bed that is easy to reach. Do not use any sheets or blankets that are too big for your bed. They should not hang down onto the floor. Have a firm chair that has side arms. You can use this for support while you get dressed. Do not have throw rugs and other things on the floor that can make you trip. What can I do in the kitchen? Clean up any spills right away. Avoid walking on wet floors. Keep items that you use a lot in easy-to-reach places. If you need to reach something above you, use a strong step stool that has a grab bar. Keep electrical cords out of the way. Do not use floor polish or wax that makes floors slippery. If you must use  wax, use non-skid floor wax. Do not have throw rugs and other things on the floor that can make you trip. What can I do with my stairs? Do not leave any items on the stairs. Make sure that there are handrails on both sides of the stairs and use them. Fix handrails that are broken or loose. Make sure that handrails are as long as the stairways. Check any carpeting to make sure that it is firmly attached to the stairs. Fix any carpet that is loose or worn. Avoid having throw rugs at the top or bottom of the stairs. If you do have throw rugs, attach them to the floor with carpet tape. Make sure that you have a light  switch at the top of the stairs and the bottom of the stairs. If you do not have them, ask someone to add them for you. What else can I do to help prevent falls? Wear shoes that: Do not have high heels. Have rubber bottoms. Are comfortable and fit you well. Are closed at the toe. Do not wear sandals. If you use a stepladder: Make sure that it is fully opened. Do not climb a closed stepladder. Make sure that both sides of the stepladder are locked into place. Ask someone to hold it for you, if possible. Clearly mark and make sure that you can see: Any grab bars or handrails. First and last steps. Where the edge of each step is. Use tools that help you move around (mobility aids) if they are needed. These include: Canes. Walkers. Scooters. Crutches. Turn on the lights when you go into a dark area. Replace any light bulbs as soon as they burn out. Set up your furniture so you have a clear path. Avoid moving your furniture around. If any of your floors are uneven, fix them. If there are any pets around you, be aware of where they are. Review your medicines with your doctor. Some medicines can make you feel dizzy. This can increase your chance of falling. Ask your doctor what other things that you can do to help prevent falls. This information is not intended to replace advice given to you by your health care provider. Make sure you discuss any questions you have with your health care provider. Document Released: 08/17/2009 Document Revised: 03/28/2016 Document Reviewed: 11/25/2014 Elsevier Interactive Patient Education  2017 [employer].

## 2021-05-17 ENCOUNTER — Telehealth: Payer: Self-pay | Admitting: Pharmacist

## 2021-05-17 NOTE — Progress Notes (Signed)
Chronic Care Management Pharmacy Assistant   Name: [name]  MRN: [id number] DOB: December 27, 1945  Reason for Encounter: Disease State-Hypertension   Recent office visits:  None noted  Recent consult visits:  04/19/21 [name] (Podiatry) - Capsulitis. Kenalog 10 mg injection.  04/17/21 [name] (Sports Med.) Acute right ankle pain.  03/08/21 [name] (Sports Med.) Acute right ankle pain.   Hospital visits:  None in previous 6 months  Medications: Outpatient Encounter Medications as of 05/17/2021  Medication Sig   Calcium Carbonate-Vitamin D (CALCIUM + D PO) Take 600 mg by mouth 2 (two) times daily.   cholecalciferol (VITAMIN D) 1000 units tablet Take 1,000 Units by mouth 2 (two) times daily.    clopidogrel (PLAVIX) 75 MG tablet Take 1 tablet (75 mg total) by mouth daily.   Continuous Blood Gluc Sensor (DEXCOM G6 SENSOR) MISC by Does not apply route.   ezetimibe (ZETIA) 10 MG tablet TAKE 1 TABLET DAILY   furosemide (LASIX) 20 MG tablet Take 1 tablet (20 mg total) by mouth as needed. Take 1 tablet by mouth as needed for 5 days.   ibuprofen (ADVIL) 200 MG tablet Take 200 mg by mouth every 6 (six) hours as needed.   Insulin Lispro-aabc (LYUMJEV) 100 UNIT/ML SOLN Use max of 90 units daily via insulin pump. (Patient taking differently: Use max of 250 units daily via insulin pump.)   losartan (COZAAR) 25 MG tablet TAKE 1 TABLET DAILY   metFORMIN (GLUCOPHAGE-XR) 500 MG 24 hr tablet TAKE 1 TABLET IN THE MORNING AND 2 TABLETS IN THE EVENING AS DIRECTED   metoprolol succinate (TOPROL-XL) 100 MG 24 hr tablet TAKE 1 TABLET DAILY   nitroGLYCERIN (NITROSTAT) 0.4 MG SL tablet Place 1 tablet (0.4 mg total) under the tongue every 5 (five) minutes as needed for chest pain.   pioglitazone (ACTOS) 30 MG tablet Take 1 tablet (30 mg total) by mouth daily.   potassium chloride (K-DUR) 10 MEQ tablet Take 1 tablet (10 mEq total) by mouth daily as needed (take with Lasix).   rosuvastatin (CRESTOR) 40 MG tablet  TAKE 1 TABLET DAILY   No facility-administered encounter medications on file as of 05/17/2021.    Reviewed chart prior to disease state call. Spoke with patient regarding BP  Recent Office Vitals: BP Readings from Last 3 Encounters:  05/14/21 116/70  04/17/21 122/70  03/08/21 (!) 110/56   Pulse Readings from Last 3 Encounters:  05/14/21 72  04/17/21 69  03/08/21 75    Wt Readings from Last 3 Encounters:  05/14/21 212 lb 12.8 oz (96.5 kg)  04/17/21 217 lb (98.4 kg)  03/08/21 216 lb 12.8 oz (98.3 kg)     Kidney Function Lab Results  Component Value Date/Time   CREATININE 0.82 02/28/2021 09:31 AM   CREATININE 0.84 11/27/2020 09:19 AM   CREATININE 0.65 09/14/2015 08:05 AM   GFR 71.07 02/28/2021 09:31 AM   GFRNONAA 69 11/17/2018 10:02 AM   GFRAA 79 11/17/2018 10:02 AM    BMP Latest Ref Rng & Units 02/28/2021 11/27/2020 07/19/2020  Glucose 70 - 99 mg/dL 132(H) 150(H) 127(H)  BUN 6 - 23 mg/dL 18 18 15   Creatinine 0.40 - 1.20 mg/dL 0.82 0.84 0.86  BUN/Creat Ratio 12 - 28 - - -  Sodium 135 - 145 mEq/L 139 136 137  Potassium 3.5 - 5.1 mEq/L 4.6 4.2 3.8  Chloride 96 - 112 mEq/L 105 101 104  CO2 19 - 32 mEq/L 23 28 27   Calcium 8.4 - 10.5 mg/dL 9.9  9.3 9.0    Current antihypertensive regimen:  Losartan 25 mg daily AM Metoprolol succinate 100 mg daily AM Furosemide 20 mg daily PRN (infrequently)  How often are you checking your Blood Pressure?  None Patient states she will start checking her BP and keeping a record.  Current home BP readings: None  What recent interventions/DTPs have been made by any provider to improve Blood Pressure control since last CPP Visit:   None noted Any recent hospitalizations or ED visits since last visit with CPP? No  What diet changes have been made to improve Blood Pressure Control?  Patient states she has been reducing her salt intake, eating less bread and more summer veggies.  What exercise is being done to improve your Blood Pressure  Control?  Patient states she has a sprain ankle so she can't exercise right now.  Adherence Review: Is the patient currently on ACE/ARB medication? Yes Does the patient have >5 day gap between last estimated fill dates? No Losartan - last fill 04/07/21 90D Metoprolol succinate - last fill 03/15/21 90D Furosemide - last fill 03/31/21 30D  Star Rating Drugs: Losartan - last fill 04/07/21 90D Metformin - last fill 04/18/20 90D (mail order) Rosuvastatin - last fill 04/03/21 90D  Patient states she needs refills on the following medications Plavix, Zetia & Rosuvastatin.  [name], [facility] Clinical Pharmacists Assistant [phone]  Time Spent: [phone]

## 2021-05-28 MED ORDER — HYDROCODONE-ACETAMINOPHEN 10-325 MG PO TABS: 1.0000 | ORAL_TABLET | Freq: Three times a day (TID) | ORAL | 0 refills | Status: AC | PRN

## 2021-05-28 NOTE — Addendum Note (Signed)
Addended by: Wallene Huh on: 05/28/2021 05:18 PM   Modules accepted: Orders

## 2021-05-29 DIAGNOSIS — M2041 Other hammer toe(s) (acquired), right foot: Secondary | ICD-10-CM | POA: Diagnosis not present

## 2021-05-30 ENCOUNTER — Encounter: Payer: Self-pay | Admitting: Podiatry

## 2021-06-01 ENCOUNTER — Telehealth: Payer: Self-pay

## 2021-06-04 ENCOUNTER — Other Ambulatory Visit: Payer: Self-pay

## 2021-06-04 ENCOUNTER — Encounter: Payer: Self-pay | Admitting: Podiatry

## 2021-06-04 ENCOUNTER — Ambulatory Visit (INDEPENDENT_AMBULATORY_CARE_PROVIDER_SITE_OTHER): Payer: Medicare Other

## 2021-06-04 ENCOUNTER — Other Ambulatory Visit: Payer: Self-pay | Admitting: Endocrinology

## 2021-06-04 ENCOUNTER — Ambulatory Visit (INDEPENDENT_AMBULATORY_CARE_PROVIDER_SITE_OTHER): Payer: Medicare Other | Admitting: Podiatry

## 2021-06-04 DIAGNOSIS — M2041 Other hammer toe(s) (acquired), right foot: Secondary | ICD-10-CM | POA: Diagnosis not present

## 2021-06-04 DIAGNOSIS — Z9889 Other specified postprocedural states: Secondary | ICD-10-CM

## 2021-06-04 NOTE — Progress Notes (Signed)
Subjective:   Patient ID: [name], female   DOB: 75 y.o.   MRN: [id number]   HPI Patient states doing very well with toe very pleased   ROS      Objective:  Physical Exam  Neurovascular status intact with patient found to have well-healing fifth digit right wound edges well coapted good alignment     Assessment:  Doing well post arthroplasty fifth digit right     Plan:  H&P x-ray reviewed again increased activity continue open toed shoes reappoint 2 weeks suture removal earlier if needed  X-rays indicate satisfactory resection of bone head of proximal phalanx digit 5 right

## 2021-06-13 ENCOUNTER — Other Ambulatory Visit: Payer: Self-pay | Admitting: Cardiovascular Disease

## 2021-06-15 ENCOUNTER — Other Ambulatory Visit (INDEPENDENT_AMBULATORY_CARE_PROVIDER_SITE_OTHER): Payer: Medicare Other

## 2021-06-15 ENCOUNTER — Other Ambulatory Visit: Payer: Self-pay

## 2021-06-15 DIAGNOSIS — E1065 Type 1 diabetes mellitus with hyperglycemia: Secondary | ICD-10-CM

## 2021-06-15 LAB — BASIC METABOLIC PANEL
BUN: 23 mg/dL (ref 6–23)
CO2: 31 mEq/L (ref 19–32)
Calcium: 9.9 mg/dL (ref 8.4–10.5)
Chloride: 100 mEq/L (ref 96–112)
Creatinine, Ser: 0.92 mg/dL (ref 0.40–1.20)
GFR: 61.77 mL/min (ref >60.00)
Glucose, Bld: 186 mg/dL — ABNORMAL HIGH (ref 70–99)
Potassium: 4.5 mEq/L (ref 3.5–5.1)
Sodium: 137 mEq/L (ref 135–145)

## 2021-06-15 LAB — MICROALBUMIN / CREATININE URINE RATIO
Creatinine,U: 82.7 mg/dL
Microalb Creat Ratio: 0.8 mg/g (ref 0.0–30.0)
Microalb, Ur: <0.7 mg/dL (ref 0.0–1.9)

## 2021-06-15 LAB — HEMOGLOBIN A1C: Hgb A1c MFr Bld: 7.2 % — ABNORMAL HIGH (ref 4.6–6.5)

## 2021-06-18 ENCOUNTER — Ambulatory Visit (INDEPENDENT_AMBULATORY_CARE_PROVIDER_SITE_OTHER): Payer: Medicare Other | Admitting: Podiatry

## 2021-06-18 ENCOUNTER — Ambulatory Visit (INDEPENDENT_AMBULATORY_CARE_PROVIDER_SITE_OTHER): Payer: Medicare Other

## 2021-06-18 ENCOUNTER — Encounter: Payer: Self-pay | Admitting: Podiatry

## 2021-06-18 ENCOUNTER — Other Ambulatory Visit: Payer: Self-pay

## 2021-06-18 DIAGNOSIS — Z9889 Other specified postprocedural states: Secondary | ICD-10-CM

## 2021-06-18 NOTE — Telephone Encounter (Signed)
Post-op call

## 2021-06-18 NOTE — Progress Notes (Signed)
Subjective:   Patient ID: [name], female   DOB: 75 y.o.   MRN: [id number]   HPI Patient states doing very well with her toe very pleased so far stitches intact   ROS      Objective:  Physical Exam  Neurovascular status intact negative Bevelyn Buckles' sign noted wound edges well coapted digit 5 right good alignment noted     Assessment:  Doing well post arthroplasty digit 5 right     Plan:  Advised on x-rays gradual return to soft shoe gear and patient will be seen back gradually over time but is encouraged to start to get more active.  Patient had all questions answered today  X-rays indicate satisfactory resection of bone good alignment of digit

## 2021-06-19 ENCOUNTER — Encounter: Payer: Self-pay | Admitting: Endocrinology

## 2021-06-19 ENCOUNTER — Ambulatory Visit (INDEPENDENT_AMBULATORY_CARE_PROVIDER_SITE_OTHER): Payer: Medicare Other | Admitting: Endocrinology

## 2021-06-19 VITALS — BP 118/62 | HR 88 | Ht 69.0 in | Wt 215.6 lb

## 2021-06-19 DIAGNOSIS — E1065 Type 1 diabetes mellitus with hyperglycemia: Secondary | ICD-10-CM | POA: Diagnosis not present

## 2021-06-19 DIAGNOSIS — E782 Mixed hyperlipidemia: Secondary | ICD-10-CM

## 2021-06-19 DIAGNOSIS — I2510 Atherosclerotic heart disease of native coronary artery without angina pectoris: Secondary | ICD-10-CM

## 2021-06-19 NOTE — Progress Notes (Signed)
Patient ID: [name], female   DOB: 1946-07-29, 75 y.o.   MRN: [id number]   Reason for visit: DIABETES followup.  Diagnosis: Type 1 diabetes mellitus, date of diagnosis: 1979.    HISTORY of present illness:   Insulin Pump: CURRENT brand: T-Slim since 08/2020  BASAL settings:  Midnight = 0.55, 3:30 AM = 1.2, 6 AM = 2.25, 10 AM = 0.75,  2 PM = 1.25, 5 PM = 2.1, 7:30 PM = 2.1, 8 PM = 3.2 and 10 PM = 1.8.    Total daily insulin = usually about 65 units, maximum 90, using Lyumjev   Carbohydrate ratio 1: 12 breakfast, 1: 5 lunch and 1: 4.5 at dinner; sensitivity MN= 1:30; 1: 25 from 5 AM-7 AM, 7 AM-12 AM = 35; target 120-130  Insulin on board indicator on, duration 4 hrs   DIABETES: She has had long-standing diabetes with A1c levels usually above target. In 10/13 pioglitazone was restarted because of her large insulin requirement and diabetic dyslipidemia.  Her blood sugars improved significantly with this.  Also has been taking metformin as insulin sensitizer  RECENT history:   Her A1c is back up to 7.2 compared to 6.7  Current diabetes management, blood sugar patterns from sensor and pump download:  Insulin pump management management: She has in the last couple of weeks 77% within the target range without excessive hypoglycemia Not clear why her A1c is higher compared to her last visit Although she does have periodically significantly high readings these are not consistent especially in the last week Again will have persistently high readings into the night and may take extra boluses by entering carbohydrate amounts for correction Rarely boluses at mealtimes may be late after starting eating With higher fat meals she will have a late rise in blood sugar; still not using dual boluses and correction doses when blood sugars are unusually high postprandially Has had only occasional hypoglycemia partly from excessive boluses but today because she was late for  breakfast She is not using the sleep mode at night as directed She has not been able to exercise again because of foot surgery Correction factor was changed to 1: 30 on the last visit with better results   Her blood sugar analysis from the [facility] information is interpreted as below  Her blood sugars are on an average HIGHEST overnight and somewhat higher early afternoon and late evening also  With this her average blood sugar is only 61% within target range in the night hours compared to 80+ %  the rest of the day  POSTPRANDIAL readings appear to be randomly higher mostly after dinner but sometimes after lunch  Occasionally high readings in the evenings may persist into the night for couple of hours  Appears to have high readings occasionally with late or inadequate boluses  With some variability in her blood sugars overnight are not consistently high and fairly stable; also does not appear to have a dawn phenomenon  On an average does not have any consistently high postprandial readings with likely the highest increment after dinner  Hypoglycemia has occurred only once or twice once recently at about 4:30 AM  Her CGM shows the following data:    CGM use % of time 93  2-week average/SD 153  Time in range 77     %  % Time Above 180 22  % Time above 250   % Time Below 70 1      PRE-MEAL  overnight  mornings  afternoon  evening Overall  Glucose range:       Averages: 168 150 145 149    Previously:   CGM use % of time   2-week average/SD 143  Time in range       80 %  % Time Above 180  19  % Time above 250   % Time Below 70 1      PRE-MEAL  overnight  mornings  afternoon  evening Overall  Glucose range:       Averages/SD:  161+/-26  123/26  138/25  153/48  143     Hypoglycemic awareness: Has symptoms of feeling tired, headache, sweaty.  She thinks he can usually recognize symptoms  Sometimes may not be aware during the night and will be notified by her sensor that the  sugar is low  She has symptoms when blood glucose is less than 60. Uses glucose tablets for treatment even overnight     DIET has been usually fairly controlled with carbohydrate and fat intake, tends to eat out on weekends The mealtimes are variable.    Food preferences: Yogurt and cottage cheese for breakfast usually.   Blood sugars with exercise: No significant problems recently with adequate control and no hypoglycemia  .  Wt Readings from Last 3 Encounters:  06/19/21 215 lb 9.6 oz (97.8 kg)  05/14/21 212 lb 12.8 oz (96.5 kg)  04/17/21 217 lb (98.4 kg)    LABS:  Lab Results  Component Value Date   HGBA1C 7.2 (H) 06/15/2021   HGBA1C 6.7 (A) 02/28/2021   HGBA1C 7.2 (H) 11/27/2020   Lab Results  Component Value Date   MICROALBUR <0.7 06/15/2021   LDLCALC 39 02/28/2021   CREATININE 0.92 06/15/2021    Lab on 06/15/2021  Component Date Value Ref Range Status   Microalb, Ur 06/15/2021 <0.7  0.0 - 1.9 mg/dL Final   Creatinine,U 06/15/2021 82.7  mg/dL Final   Microalb Creat Ratio 06/15/2021 0.8  0.0 - 30.0 mg/g Final   Sodium 06/15/2021 137  135 - 145 mEq/L Final   Potassium 06/15/2021 4.5  3.5 - 5.1 mEq/L Final   Chloride 06/15/2021 100  96 - 112 mEq/L Final   CO2 06/15/2021 31  19 - 32 mEq/L Final   Glucose, Bld 06/15/2021 186 (A) 70 - 99 mg/dL Final   BUN 06/15/2021 23  6 - 23 mg/dL Final   Creatinine, Ser 06/15/2021 0.92  0.40 - 1.20 mg/dL Final   GFR 06/15/2021 61.77  >60.00 mL/min Final   Calculated using the CKD-EPI Creatinine Equation (2021)   Calcium 06/15/2021 9.9  8.4 - 10.5 mg/dL Final   Hgb A1c MFr Bld 06/15/2021 7.2 (A) 4.6 - 6.5 % Final   Glycemic Control Guidelines for People with Diabetes:Non Diabetic:  <6%Goal of Therapy: <7%Additional Action Suggested:  >8%      Allergies as of 06/19/2021       Reactions   Sulfa Antibiotics Rash        Medication List        Accurate as of June 19, 2021  9:23 AM. If you have any questions, ask your  nurse or doctor.          ASPIRIN 81 PO Take by mouth.   CALCIUM + D PO Take 600 mg by mouth 2 (two) times daily.   cholecalciferol 1000 units tablet Commonly known as: VITAMIN D Take 1,000 Units by mouth 2 (two) times daily.   clopidogrel 75 MG tablet Commonly  known as: PLAVIX Take 1 tablet (75 mg total) by mouth daily.   Dexcom G6 Sensor Misc by Does not apply route.   ezetimibe 10 MG tablet Commonly known as: ZETIA TAKE 1 TABLET DAILY   furosemide 20 MG tablet Commonly known as: LASIX Take 1 tablet (20 mg total) by mouth as needed. Take 1 tablet by mouth as needed for 5 days.   ibuprofen 200 MG tablet Commonly known as: ADVIL Take 200 mg by mouth every 6 (six) hours as needed.   losartan 25 MG tablet Commonly known as: COZAAR TAKE 1 TABLET DAILY   Lyumjev 100 UNIT/ML Soln Generic drug: Insulin Lispro-aabc USE A MAXIMUM OF 90 UNITS DAILY VIA INSULIN PUMP   metFORMIN 500 MG 24 hr tablet Commonly known as: GLUCOPHAGE-XR TAKE 1 TABLET IN THE MORNING AND 2 TABLETS IN THE EVENING AS DIRECTED   metoprolol succinate 100 MG 24 hr tablet Commonly known as: TOPROL-XL TAKE 1 TABLET DAILY   nitroGLYCERIN 0.4 MG SL tablet Commonly known as: Nitrostat Place 1 tablet (0.4 mg total) under the tongue every 5 (five) minutes as needed for chest pain.   pioglitazone 30 MG tablet Commonly known as: Actos Take 1 tablet (30 mg total) by mouth daily.   potassium chloride 10 MEQ tablet Commonly known as: KLOR-CON Take 1 tablet (10 mEq total) by mouth daily as needed (take with Lasix).   rosuvastatin 40 MG tablet Commonly known as: CRESTOR TAKE 1 TABLET DAILY        Allergies:  Allergies  Allergen Reactions   Sulfa Antibiotics Rash    Past Medical History:  Diagnosis Date   Arthritis    OA   Atrial flutter ([place])    a. s/p TEE/DCCV 08/16/13 (normal LV function, no LAA thrombus).b. s/p ablation by Dr Lovena Le 09-23-2013   Coronary artery disease    2009 LAD  Promus stent   Dysrhythmia    Aflutter, no issues after ablation 2014   Fibroid    Hyperlipidemia    Insulin pump in place    Osteopenia    Type 1 diabetes mellitus on insulin therapy ([place])    Valvular heart disease    a. Mild MR/TR by TEE 08/2013.    Past Surgical History:  Procedure Laterality Date   ABLATION  09-23-2013   CTI by Dr Lovena Le   APPENDECTOMY     APPLICATION OF WOUND VAC Left 04/11/2017   Procedure: APPLICATION OF WOUND VAC LEFT KNEE;  Surgeon: Rod Can, MD;  Location: [facility];  Service: Orthopedics;  Laterality: Left;   ATRIAL FLUTTER ABLATION N/A 09/23/2013   Procedure: ATRIAL FLUTTER ABLATION;  Surgeon: Evans Lance, MD;  [facility];  Service: Cardiovascular;  Laterality: N/A;   CARDIOVERSION N/A 08/16/2013   Procedure: CARDIOVERSION;  Surgeon: Thayer Headings, MD;  Location: [facility];  Service: Cardiovascular;  Laterality: N/A;  spoke with Tom   CATARACT EXTRACTION Bilateral    COLONOSCOPY     CORONARY ANGIOPLASTY WITH STENT [place]   ORIF PATELLA Left 04/11/2017   ORIF PATELLA Left 04/11/2017   Procedure: OPEN REDUCTION INTERNAL (ORIF) FIXATION PATELLA LEFT KNEE;  Surgeon: Rod Can, MD;  Location: [facility];  Service: Orthopedics;  Laterality: Left;   PELVIC LAPAROSCOPY     TEE WITHOUT CARDIOVERSION N/A 08/16/2013   Procedure: TRANSESOPHAGEAL ECHOCARDIOGRAM (TEE);  Surgeon: Thayer Headings, MD;  Location: [place];  Service: Cardiovascular;  Laterality: N/A;    Family History  Problem Relation Age of Onset   CAD Father 51       died age 20   CAD Brother 15       small vessel disease   Atrial fibrillation Mother    Diabetes Mother    Hypertension Mother    CAD Cousin        paternal   CAD Paternal Grandfather        early onset   Colon cancer Neg Hx     Social History:  reports that she has never smoked. She has never used smokeless tobacco. She reports current alcohol use of about 5.0 standard drinks  per week. She reports that she does not use drugs.  REVIEW of systems:   Cardiac function: Her echo shows only grade 1 diastolic dysfunction and normal systolic function  She has had atrial arrhythmias treated with metoprolol  Blood pressure usually normal, on 25 mg losartan from cardiologist for preventive reasons  Blood pressure was higher on first measurement today, does not monitor at home  BP Readings from Last 3 Encounters:  06/19/21 118/62  05/14/21 116/70  04/17/21 122/70     She has had diabetic dyslipidemia with significantly high LDL particle number This has been well controlled with Crestor 40 mg and Niaspan 1000 mg Her last LDL particle number was down to below 900, labs pending from today  Niacin was stopped because of tending to get flushing and itching which may be late at night, not generally prevented by aspirin, also she finds the regimen difficult to do at night  She has been regular with her Crestor 40 and Zetia without side effects This is controlling HDL and improved particle number  Zetia added in 3/19  Labs as follows:  Lab Results  Component Value Date   CHOL 104 02/28/2021   CHOL 100 07/19/2020   CHOL 126 01/11/2020   Lab Results  Component Value Date   HDL 47.10 02/28/2021   HDL 47.60 07/19/2020   HDL 47.00 01/11/2020   Lab Results  Component Value Date   LDLCALC 39 02/28/2021   LDLCALC 40 07/19/2020   LDLCALC 59 01/11/2020   Lab Results  Component Value Date   TRIG 89.0 02/28/2021   TRIG 63.0 07/19/2020   TRIG 100.0 01/11/2020   Lab Results  Component Value Date   CHOLHDL 2 02/28/2021   CHOLHDL 2 07/19/2020   CHOLHDL 3 01/11/2020   Lab Results  Component Value Date   LDLDIRECT 79.0 01/11/2015   LDLDIRECT 73.1 10/13/2014      She has had consistently normal thyroid levels  Lab Results  Component Value Date   TSH 2.23 10/06/2019   TSH 1.86 01/19/2019   TSH 1.684 08/15/2013    No retinopathy reportedly on last  exam    BP 118/62   Pulse 88   Ht '5\' 9"'$  (1.753 m)   Wt 215 lb 9.6 oz (97.8 kg)   SpO2 98%   BMI 31.84 kg/m      ASSESSMENT/PLAN:  DIABETES type 1 on T-slim insulin pump  Her A1c is  7.2   See history of present illness for detailed discussion of her current blood sugar patterns and management with her pump  Her insulin pump download and Dexcom data on the download was reviewed and interpreted as above  Blood sugar within target range 77% of the time although highest overnight She likely can get a better postprandial control as discussed above Hypoglycemia has been minimal Also likely somewhat better  control overnight with avoiding sleep mode and enabling auto correction However she does need to do manual correction boluses when blood sugars are persistently high, explained that recent low sugar overnight was from entering extra carbohydrates  Recommendations:  Basal rate will be unchanged She was reminded to start bolusing consistently before starting a meal She will try to do supplemental boluses if blood sugars are unexpectedly higher postprandially especially if they are staying consistently high 2 to 3 hours later Discussed how to do correction boluses with only blood sugar entry and no carbohydrate entry at that time May also try extended boluses for higher fat meals Sensitivity 1: 25 instead of 30 throughout the day to enable better correction including with auto correction She may need to use the exercise mode when she is starting a workout at the gym  Microalbumin normal  Hyperlipidemia: Will repeat labs on next visit, currently on Zetia and Crestor   Follow-up in 4 months   There are no Patient Instructions on file for this visit.   Elayne Snare 06/19/21   Note: This office note was prepared with Dragon voice recognition system technology. Any transcriptional errors that result from this process are unintentional.

## 2021-06-20 DIAGNOSIS — I10 Essential (primary) hypertension: Secondary | ICD-10-CM

## 2021-06-20 MED ORDER — CLOPIDOGREL BISULFATE 75 MG PO TABS
75.0000 mg | ORAL_TABLET | Freq: Every day | ORAL | 1 refills | Status: DC
Start: 2021-06-20 — End: 2021-12-24

## 2021-06-20 MED ORDER — PIOGLITAZONE HCL 30 MG PO TABS
30.0000 mg | ORAL_TABLET | Freq: Every day | ORAL | 3 refills | Status: DC
Start: 2021-06-20 — End: 2021-12-24

## 2021-06-26 ENCOUNTER — Telehealth: Payer: Self-pay | Admitting: Endocrinology

## 2021-06-26 NOTE — Telephone Encounter (Signed)
Pt voiced that [employer] mail order express scripts will not fill actos, due to needing to know information regarding drug interactions, from provider. Pt states that they have reached out to [place] for this but not have gotten any response. Pt is reaching out to [place]. Pt would like a call back. Pt has enough to last her for two weeks    pioglitazone (ACTOS) 30 MG tablet  [facility] [place], [place]

## 2021-06-28 NOTE — Telephone Encounter (Signed)
Paperwork received. Waiting for Dr to complete so that I can fax back.

## 2021-07-02 ENCOUNTER — Other Ambulatory Visit: Payer: Self-pay | Admitting: Endocrinology

## 2021-07-06 ENCOUNTER — Other Ambulatory Visit: Payer: Self-pay | Admitting: Cardiovascular Disease

## 2021-07-19 DIAGNOSIS — Z23 Encounter for immunization: Secondary | ICD-10-CM | POA: Diagnosis not present

## 2021-08-07 DIAGNOSIS — Z23 Encounter for immunization: Secondary | ICD-10-CM | POA: Diagnosis not present

## 2021-08-23 ENCOUNTER — Telehealth (INDEPENDENT_AMBULATORY_CARE_PROVIDER_SITE_OTHER): Payer: Medicare Other | Admitting: Family Medicine

## 2021-08-23 ENCOUNTER — Encounter: Payer: Self-pay | Admitting: Family Medicine

## 2021-08-23 VITALS — Temp 100.2°F

## 2021-08-23 DIAGNOSIS — I2510 Atherosclerotic heart disease of native coronary artery without angina pectoris: Secondary | ICD-10-CM

## 2021-08-23 DIAGNOSIS — R6889 Other general symptoms and signs: Secondary | ICD-10-CM

## 2021-08-23 MED ORDER — BENZONATATE 100 MG PO CAPS: ORAL_CAPSULE | ORAL | 0 refills | Status: DC

## 2021-08-23 MED ORDER — OSELTAMIVIR PHOSPHATE 75 MG PO CAPS: 75.0000 mg | ORAL_CAPSULE | Freq: Two times a day (BID) | ORAL | 0 refills | Status: DC

## 2021-08-23 NOTE — Progress Notes (Signed)
Virtual Visit via Video Note  I connected with [name]  on 08/23/21 at 11:40 AM EDT by a video enabled telemedicine application and verified that I am speaking with the correct person using two identifiers.  Location patient: home, [place] Location provider:work or home office Persons participating in the virtual visit: patient, provider  I discussed the limitations of evaluation and management by telemedicine and the availability of in person appointments. The patient expressed understanding and agreed to proceed.   HPI:  Acute telemedicine visit for flu like symptoms: -Onset: about 1 day ago -grandson was sick last week with a resp illness -Symptoms include: lots of sinus/nasal congestion, cough, fever 100.4, body aches -Denies:CP, SOB, NVD, inability to eat/drink/get out of bed -Pertinent past medical history:see below -Pertinent medication allergies:  Allergies  Allergen Reactions   Sulfa Antibiotics Rash  -COVID-19 vaccine status: has had 2 doses and 3 boosters (had updated booster in September) -had flu shot a few weeks ago  ROS: See pertinent positives and negatives per HPI.  Past Medical History:  Diagnosis Date   Arthritis    OA   Atrial flutter ([place])    a. s/p TEE/DCCV 08/16/13 (normal LV function, no LAA thrombus).b. s/p ablation by Dr Lovena Le 09-23-2013   Coronary artery disease    2009 LAD Promus stent   Dysrhythmia    Aflutter, no issues after ablation 2014   Fibroid    Hyperlipidemia    Insulin pump in place    Osteopenia    Type 1 diabetes mellitus on insulin therapy ([place])    Valvular heart disease    a. Mild MR/TR by TEE 08/2013.    Past Surgical History:  Procedure Laterality Date   ABLATION  09-23-2013   CTI by Dr Lovena Le   APPENDECTOMY     APPLICATION OF WOUND VAC Left 04/11/2017   Procedure: APPLICATION OF WOUND VAC LEFT KNEE;  Surgeon: Rod Can, MD;  Location: [facility];  Service: Orthopedics;  Laterality: Left;   ATRIAL FLUTTER ABLATION N/A  09/23/2013   Procedure: ATRIAL FLUTTER ABLATION;  Surgeon: Evans Lance, MD;  Location: [facility] CATH LAB;  Service: Cardiovascular;  Laterality: N/A;   CARDIOVERSION N/A 08/16/2013   Procedure: CARDIOVERSION;  Surgeon: Thayer Headings, MD;  Location: [facility];  Service: Cardiovascular;  Laterality: N/A;  spoke with Tom   CATARACT EXTRACTION Bilateral    COLONOSCOPY     CORONARY ANGIOPLASTY WITH STENT [place]   ORIF PATELLA Left 04/11/2017   ORIF PATELLA Left 04/11/2017   Procedure: OPEN REDUCTION INTERNAL (ORIF) FIXATION PATELLA LEFT KNEE;  Surgeon: Rod Can, MD;  Location: [facility];  Service: Orthopedics;  Laterality: Left;   PELVIC LAPAROSCOPY     TEE WITHOUT CARDIOVERSION N/A 08/16/2013   Procedure: TRANSESOPHAGEAL ECHOCARDIOGRAM (TEE);  Surgeon: Thayer Headings, MD;  Location[facility] ENDOSCOPY;  Service: Cardiovascular;  Laterality: N/A;     Current Outpatient Medications:    ASPIRIN 81 PO, Take by mouth., Disp: , Rfl:    benzonatate (TESSALON PERLES) 100 MG capsule, 1-2 tablets twice daily as needed for cough, Disp: 30 capsule, Rfl: 0   Calcium Carbonate-Vitamin D (CALCIUM + D PO), Take 600 mg by mouth 2 (two) times daily., Disp: , Rfl:    cholecalciferol (VITAMIN D) 1000 units tablet, Take 1,000 Units by mouth 2 (two) times daily. , Disp: , Rfl:    clopidogrel (PLAVIX) 75 MG tablet, Take 1 tablet (75 mg total) by mouth daily., Disp: 90  tablet, Rfl: 1   Continuous Blood Gluc Sensor (DEXCOM G6 SENSOR) MISC, by Does not apply route., Disp: , Rfl:    ezetimibe (ZETIA) 10 MG tablet, TAKE 1 TABLET DAILY, Disp: 90 tablet, Rfl: 3   furosemide (LASIX) 20 MG tablet, Take 1 tablet (20 mg total) by mouth as needed. Take 1 tablet by mouth as needed for 5 days., Disp: 30 tablet, Rfl: 11   ibuprofen (ADVIL) 200 MG tablet, Take 200 mg by mouth every 6 (six) hours as needed., Disp: , Rfl:    Insulin Lispro-aabc (LYUMJEV) 100 UNIT/ML SOLN, USE A MAXIMUM OF 90 UNITS DAILY VIA  INSULIN PUMP, Disp: 80 mL, Rfl: 3   losartan (COZAAR) 25 MG tablet, TAKE 1 TABLET DAILY, Disp: 90 tablet, Rfl: 1   metFORMIN (GLUCOPHAGE-XR) 500 MG 24 hr tablet, TAKE 1 TABLET IN THE MORNING AND 2 TABLETS IN THE EVENING AS DIRECTED, Disp: 270 tablet, Rfl: 3   metoprolol succinate (TOPROL-XL) 100 MG 24 hr tablet, TAKE 1 TABLET DAILY, Disp: 90 tablet, Rfl: 1   nitroGLYCERIN (NITROSTAT) 0.4 MG SL tablet, Place 1 tablet (0.4 mg total) under the tongue every 5 (five) minutes as needed for chest pain., Disp: 75 tablet, Rfl: 2   oseltamivir (TAMIFLU) 75 MG capsule, Take 1 capsule (75 mg total) by mouth 2 (two) times daily., Disp: 10 capsule, Rfl: 0   pioglitazone (ACTOS) 30 MG tablet, Take 1 tablet (30 mg total) by mouth daily., Disp: 90 tablet, Rfl: 3   potassium chloride (K-DUR) 10 MEQ tablet, Take 1 tablet (10 mEq total) by mouth daily as needed (take with Lasix)., Disp: 90 tablet, Rfl: 3   rosuvastatin (CRESTOR) 40 MG tablet, TAKE 1 TABLET DAILY, Disp: 90 tablet, Rfl: 3  EXAM:  VITALS per patient if applicable:  GENERAL: alert, oriented, appears well and in no acute distress  HEENT: atraumatic, conjunttiva clear, no obvious abnormalities on inspection of external nose and ears  NECK: normal movements of the head and neck  LUNGS: on inspection no signs of respiratory distress, breathing rate appears normal, no obvious gross SOB, gasping or wheezing  CV: no obvious cyanosis  MS: moves all visible extremities without noticeable abnormality  PSYCH/NEURO: pleasant and cooperative, no obvious depression or anxiety, speech and thought processing grossly intact  ASSESSMENT AND PLAN:  Discussed the following assessment and plan:  Flu-like symptoms  -we discussed possible serious and likely etiologies, options for evaluation and workup, limitations of telemedicine visit vs in person visit, treatment, treatment risks and precautions. Pt is agreeable to treatment via telemedicine at this moment.  Query influenza, Covid19 vs other. She does not wish to go inperson for care right now and has opted to retest for covid at home. She is aware of options for follow up for treatment via [place] pharmacies or virtual visit f/u if covid testing is positive. In the interim she wants to start tamiflu empirically for possible flu.  Also, Tessalon sent for cough.  Other symptomatic care measures summarized in patient instructions.  Advised to seek prompt in person care if worsening, new symptoms arise, or if is not improving with treatment. Discussed options for inperson care if PCP office not available. Did let this patient know that I only do telemedicine on Tuesdays and Thursdays for [place]. Advised to schedule follow up visit with PCP or [facility] if any further questions or concerns to avoid delays in care.   I discussed the assessment and treatment plan with the patient. The patient was provided an  opportunity to ask questions and all were answered. The patient agreed with the plan and demonstrated an understanding of the instructions.     Lucretia Kern, DO

## 2021-08-23 NOTE — Patient Instructions (Signed)
  HOME CARE TIPS:  -COVID19 testing information: [email or web address]  Most pharmacies also offer testing and home test kits. If the Covid19 test is positive and you desire antiviral treatment, please contact a [facility] or schedule a follow up virtual visit through your primary care office or through the [facility].  Please stop the Tamiflu if the COVID test is positive. Other test to treat options: [email or web address]  -I sent the medication(s) we discussed to your pharmacy: Meds ordered this encounter  Medications   oseltamivir (TAMIFLU) 75 MG capsule    Sig: Take 1 capsule (75 mg total) by mouth 2 (two) times daily.    Dispense:  10 capsule    Refill:  0   benzonatate (TESSALON PERLES) 100 MG capsule    Sig: 1-2 tablets twice daily as needed for cough    Dispense:  30 capsule    Refill:  0      -can use tylenolif needed for fevers, aches and pains per instructions  -can use nasal saline a few times per day if you have nasal congestion; sometimes  a short course of Afrin nasal spray for 3 days can help with symptoms as well  -stay hydrated, drink plenty of fluids and eat small healthy meals - avoid dairy  -can take 1000 IU (2mcg) Vit D3 and 100-500 mg of Vit C daily per instructions  -If the Covid test is positive, check out the CDC website for more information on home care, transmission and treatment for COVID19  -follow up with your doctor in 2-3 days unless improving and feeling better  -stay home while sick, except to seek medical care. If you have COVID19, ideally it would be best to stay home for a full 10 days since the onset of symptoms PLUS one day of no fever and feeling better. Wear a good mask that fits snugly (such as N95 or KN95) if around others to reduce the risk of transmission.  It was nice to meet you today, and I really hope you are feeling better soon. I help [name] out with  telemedicine visits on Tuesdays and Thursdays and am available for visits on those days. If you have any concerns or questions following this visit please schedule a follow up visit with your Primary Care doctor or seek care at a local urgent care clinic to avoid delays in care.    Seek in person care or schedule a follow up video visit promptly if your symptoms worsen, new concerns arise or you are not improving with treatment. Call 911 and/or seek emergency care if your symptoms are severe or life threatening.

## 2021-08-30 ENCOUNTER — Ambulatory Visit (INDEPENDENT_AMBULATORY_CARE_PROVIDER_SITE_OTHER): Payer: Medicare Other | Admitting: Pharmacist

## 2021-08-30 ENCOUNTER — Other Ambulatory Visit: Payer: Self-pay

## 2021-08-30 DIAGNOSIS — M159 Polyosteoarthritis, unspecified: Secondary | ICD-10-CM

## 2021-08-30 DIAGNOSIS — M8580 Other specified disorders of bone density and structure, unspecified site: Secondary | ICD-10-CM

## 2021-08-30 DIAGNOSIS — I10 Essential (primary) hypertension: Secondary | ICD-10-CM

## 2021-08-30 DIAGNOSIS — M150 Primary generalized (osteo)arthritis: Secondary | ICD-10-CM

## 2021-08-30 DIAGNOSIS — I2510 Atherosclerotic heart disease of native coronary artery without angina pectoris: Secondary | ICD-10-CM

## 2021-08-30 DIAGNOSIS — E1169 Type 2 diabetes mellitus with other specified complication: Secondary | ICD-10-CM

## 2021-08-30 DIAGNOSIS — E1065 Type 1 diabetes mellitus with hyperglycemia: Secondary | ICD-10-CM

## 2021-08-30 NOTE — Progress Notes (Signed)
Chronic Care Management Pharmacy Note  09/03/2021 Name:  [name] MRN:  [id number] DOB:  1946/02/11  Summary: -Pt is recovering from viral respiratory illness (s/p Tamiflu); her sugars were high for a few days but now back to normal -Pt is due for repeat DEXA scan (last 2016), she deferred for now  Recommendations/Changes made from today's visit: -No medication changes -Recommend repeat DEXA scan when pt is agreeable   Subjective: [name] is an 75 y.o. year old female who is a primary patient of Audrey Koch, MD.  The CCM team was consulted for assistance with disease management and care coordination needs.    Engaged with patient by telephone for follow up visit in response to provider referral for pharmacy case management and/or care coordination services.   Consent to Services:  The patient was given information about Chronic Care Management services, agreed to services, and gave verbal consent prior to initiation of services.  Please see initial visit note for detailed documentation.   Patient Care Team: Audrey Koch, MD as PCP - General (Internal Medicine) [name], Wonda Cheng, MD as PCP - Cardiology (Cardiology) Elayne Snare, MD (Endocrinology) [name], Wonda Cheng, MD (Cardiology) Audrey Coke, MD (Dermatology) Audrey Lance, MD (Cardiology) Audrey Dragon, MD (Inactive) (Gastroenterology) Audrey Pulley, DO (Sports Medicine) [name], [facility] as Pharmacist (Pharmacist) [name], [name], OD as Consulting Physician (Optometry)   Pt lives with husband of 54 years, have lived in [place] the whole time. She has 31-year old granddaughter who just recently moved back to [facility] from [place] and they visit often.   They have a dog. They have season tickets to the [facility]. They have a beach house in [place].   Recent office visits: 02/14/21 [name] (PCP) - Urinary frequency. Start Fluconzale 150 mg every 72 hrs  01/19/21 [name] (PCP) -  Hyperlipidemia. Start Nirmatrelvir-Ritonavir (Paxlovid for covid). F/u 1 year.  12/18/20 [name] (PCP) - Televisit. Hematuria. Start Cephalexin 500 mg 2x day.  09/05/20 [name], [place] (PCP) -  UTI. Start nitrofurantoin. F/u prn.   Recent consult visits: 06/19/21 Dr [name] (endocrine): f/u DM. A1c increased to 7.2. Advised insulin pump boluses for correction w/o inputting carbs.  04/17/21 Dr [name] (sports med): f/u ankle pain  02/28/21 [name] (Endocrinology) - Uncontrolled type 1 diabetes mellitus with hyperglycemia. A1C 7.2. F/u 3 months.  12/21/20  [name] (Family Medicine) - Covid 47. Start Benzonatate 100 mg 3x day.  12/14/20 [name] (Podiatry) - Callouses. Injection 1 mg Dexasone & Kenalog 3 mg Xylocaine. No med changes.  11/30/20 [name] (Endocrinology) - Uncontrolled type 1 diabetes mellitus with hyperglycemia.  No med changes. F/u 3 months.  11/27/20 [name] (Cardiology) - Coronary Artery Disease; Stop aspirin, niacin and nitrofurantoin. Start Furosemide 20 mg for 5 days. F/u 1 year.   09/14/20 [name] (Endocrinology) - Uncontrolled type 1  diabetes mellitus with hyperglycemia. D/c Insulin Aspart, change to biosimilar. F/u 2  months.  Hospital visits: None in previous 6 months  Objective:  Lab Results  Component Value Date   CREATININE 0.92 06/15/2021   BUN 23 06/15/2021   GFR 61.77 06/15/2021   GFRNONAA 69 11/17/2018   GFRAA 79 11/17/2018   NA 137 06/15/2021   K 4.5 06/15/2021   CALCIUM 9.9 06/15/2021   CO2 31 06/15/2021   GLUCOSE 186 (H) 06/15/2021    Lab Results  Component Value Date/Time   HGBA1C 7.2 (H) 06/15/2021 09:00 AM   HGBA1C 6.7 (A) 02/28/2021 08:56 AM   HGBA1C 7.2 (  H) 11/27/2020 09:19 AM   FRUCTOSAMINE 270 10/06/2019 08:18 AM   FRUCTOSAMINE 292 (H) 02/04/2017 11:30 AM   GFR 61.77 06/15/2021 09:00 AM   GFR 71.07 02/28/2021 09:31 AM   MICROALBUR <0.7 06/15/2021 09:00 AM   MICROALBUR <0.7 04/18/2020 09:06 AM    Last diabetic Eye exam:  Lab Results  Component Value  Date/Time   HMDIABEYEEXA No Retinopathy 09/09/2019 12:00 AM    Last diabetic Foot exam: No results found for: HMDIABFOOTEX   Lab Results  Component Value Date   CHOL 104 02/28/2021   HDL 47.10 02/28/2021   LDLCALC 39 02/28/2021   LDLDIRECT 79.0 01/11/2015   TRIG 89.0 02/28/2021   CHOLHDL 2 02/28/2021    Hepatic Function Latest Ref Rng & Units 02/28/2021 07/19/2020 01/11/2020  Total Protein 6.0 - 8.3 g/dL 7.0 6.5 6.9  Albumin 3.5 - 5.2 g/dL 4.1 3.8 4.0  AST 0 - 37 U/L $Remo'23 19 27  'zOVGZ$ ALT 0 - 35 U/L 30 22 33  Alk Phosphatase 39 - 117 U/L 91 83 102  Total Bilirubin 0.2 - 1.2 mg/dL 0.5 0.6 0.4  Bilirubin, Direct 0.00 - 0.40 mg/dL - - -    Lab Results  Component Value Date/Time   TSH 2.23 10/06/2019 08:18 AM   TSH 1.86 01/19/2019 08:48 AM    CBC Latest Ref Rng & Units 11/22/2019 04/11/2017 03/27/2017  WBC 3.4 - 10.8 x10E3/uL 6.5 5.5 6.5  Hemoglobin 11.1 - 15.9 g/dL 14.4 12.8 13.9  Hematocrit 34.0 - 46.6 % 43.9 39.5 41.3  Platelets 150 - 450 x10E3/uL 246 321 302    Lab Results  Component Value Date/Time   VD25OH 55.38 02/14/2017 11:35 AM   VD25OH 64 01/13/2014 07:40 AM    Clinical ASCVD: Yes  The ASCVD Risk score (Arnett DK, et al., 2019) failed to calculate for the following reasons:   The valid total cholesterol range is 130 to 320 mg/dL    Depression screen [facility] 2/9 05/14/2021 02/14/2021 01/20/2020  Decreased Interest 0 0 0  Down, Depressed, Hopeless 0 0 1  PHQ - 2 Score 0 0 1  Some recent data might be hidden      Social History   Tobacco Use  Smoking Status Never  Smokeless Tobacco Never   BP Readings from Last 3 Encounters:  06/19/21 118/62  05/14/21 116/70  04/17/21 122/70   Pulse Readings from Last 3 Encounters:  06/19/21 88  05/14/21 72  04/17/21 69   Wt Readings from Last 3 Encounters:  06/19/21 215 lb 9.6 oz (97.8 kg)  05/14/21 212 lb 12.8 oz (96.5 kg)  04/17/21 217 lb (98.4 kg)   BMI Readings from Last 3 Encounters:  06/19/21 31.84 kg/m  05/14/21  31.43 kg/m  04/17/21 32.05 kg/m    Assessment/Interventions: Review of patient past medical history, allergies, medications, health status, including review of consultants reports, laboratory and other test data, was performed as part of comprehensive evaluation and provision of chronic care management services.   SDOH:  (Social Determinants of Health) assessments and interventions performed: Yes   SDOH Screenings   Alcohol Screen: Not on file  Depression (PHQ2-9): Low Risk    PHQ-2 Score: 0  Financial Resource Strain: Low Risk    Difficulty of Paying Living Expenses: Not hard at all  Food Insecurity: Not on file  Housing: Not on file  Physical Activity: Not on file  Social Connections: Socially Integrated   Frequency of Communication with Friends and Family: More than three times a week  Frequency of Social Gatherings with Friends and Family: Twice a week   Attends Religious Services: More than 4 times per year   Active Member of [employer] or Organizations: Yes   Attends Music therapist: More than 4 times per year   Marital Status: Married  Stress: Not on file  Tobacco Use: Low Risk    Smoking Tobacco Use: Never   Smokeless Tobacco Use: Never   Passive Exposure: Not on file  Transportation Needs: Not on file    [place]  Allergies  Allergen Reactions   Sulfa Antibiotics Rash    Medications Reviewed Today     Reviewed by [name], [place] (Pharmacist) on 09/03/21 at 1008  Med List Status: <None>   Medication Order Taking? Sig Documenting Provider Last Dose Status Informant  ASPIRIN 81 PO [id number] Yes Take by mouth. [provider] Taking Active   benzonatate (TESSALON PERLES) 100 MG capsule [id number] Yes 1-2 tablets twice daily as needed for cough Lucretia Kern, DO Taking Active   Calcium Carbonate-Vitamin D (CALCIUM + D PO) [id number] Yes Take 600 mg by mouth 2 (two) times daily. [provider] Taking Active Self   cholecalciferol (VITAMIN D) 1000 units tablet [id number] Yes Take 1,000 Units by mouth 2 (two) times daily.  [provider] Taking Active Self  clopidogrel (PLAVIX) 75 MG tablet [id number] Yes Take 1 tablet (75 mg total) by mouth daily. Elayne Snare, MD Taking Active   Continuous Blood Gluc Sensor ([place]) [place] [id number] Yes by Does not apply route. [provider] Taking Active   ezetimibe (ZETIA) 10 MG tablet [id number] Yes TAKE 1 TABLET DAILY Elayne Snare, MD Taking Active   furosemide (LASIX) 20 MG tablet [id number] Yes Take 1 tablet (20 mg total) by mouth as needed. Take 1 tablet by mouth as needed for 5 days.  Patient taking differently: Take 20 mg by mouth as needed.   [name], Wonda Cheng, MD Taking Active   ibuprofen (ADVIL) 200 MG tablet [id number] Yes Take 200 mg by mouth every 6 (six) hours as needed. [provider] Taking Active   Insulin Lispro-aabc (LYUMJEV) 100 UNIT/ML SOLN [id number] Yes USE A MAXIMUM OF 90 UNITS DAILY VIA INSULIN PUMP Elayne Snare, MD Taking Active   losartan (COZAAR) 25 MG tablet [id number] Yes TAKE 1 TABLET DAILY [name], Wonda Cheng, MD Taking Active   metFORMIN (GLUCOPHAGE-XR) 500 MG 24 hr tablet [id number] Yes TAKE 1 TABLET IN THE MORNING AND 2 TABLETS IN THE EVENING AS DIRECTED Elayne Snare, MD Taking Active   metoprolol succinate (TOPROL-XL) 100 MG 24 hr tablet [id number] Yes TAKE 1 TABLET DAILY [name], Wonda Cheng, MD Taking Active   nitroGLYCERIN (NITROSTAT) 0.4 MG SL tablet [id number] Yes Place 1 tablet (0.4 mg total) under the tongue every 5 (five) minutes as needed for chest pain. [name], Wonda Cheng, MD Taking Active   pioglitazone (ACTOS) 30 MG tablet [id number] Yes Take 1 tablet (30 mg total) by mouth daily. Elayne Snare, MD Taking Active   potassium chloride (K-DUR) 10 MEQ tablet [id number] Yes Take 1 tablet (10 mEq total) by mouth daily as needed (take with Lasix). [name], Wonda Cheng, MD Taking Active   rosuvastatin (CRESTOR) 40 MG tablet  [id number] Yes TAKE 1 TABLET DAILY Elayne Snare, MD Taking Active             Patient Active Problem List   Diagnosis Date Noted   Urinary frequency 02/15/2021   Hypertension 05/13/2019   Urinary incontinence  01/19/2019   Valvular heart disease    Insulin pump in place    Fibroid    Osteoarthritis    Displaced comminuted fracture of left patella, initial encounter for closed fracture 04/11/2017   Routine general medical examination at a health care facility 11/08/2015   Hyperlipidemia associated with type 2 diabetes mellitus ([place]) 03/16/2015   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   Uncontrolled type 1 diabetes mellitus 05/12/2013   Chronic insomnia 03/25/2012   Osteopenia    Coronary artery disease 04/05/2011   Atrial flutter ([place]) 04/05/2011    Immunization History  Administered Date(s) Administered   Hep A / Hep B 06/13/2016   Influenza Split 08/04/2012   Influenza, High Dose Seasonal PF 07/18/2016, 08/12/2017, 07/28/2018, 07/28/2018, 08/04/2021   Influenza,inj,Quad PF,6+ Mos 08/09/2013, 08/11/2014   Influenza-Unspecified 08/16/2015, 08/13/2020   PFIZER(Purple Top)SARS-COV-2 Vaccination 11/23/2019, 12/14/2019, 07/18/2020, 03/04/2021, 07/05/2021   Pneumococcal Conjugate-13 11/07/2014   Pneumococcal Polysaccharide-23 11/05/2004, 09/24/2013   Td 01/14/1997   Tdap 03/25/2012   Zoster Recombinat (Shingrix) 08/18/2018, 10/22/2018   Zoster, Live 11/04/2010    Conditions to be addressed/monitored:  Hypertension, Hyperlipidemia, Diabetes, Coronary Artery Disease, Osteopenia and Osteoarthritis  Care Plan : [place]  Updates made by [name], [place] since 09/03/2021 12:00 AM     Problem: Hypertension, Hyperlipidemia, Diabetes, Coronary Artery Disease, Osteopenia   Priority: High     Long-Range Goal: Disease management   Start Date: 03/02/2021  Expected End Date: 03/02/2022  This Visit's Progress: On track  Recent Progress: On  track  Priority: High  Note:   Current Barriers:  Unable to independently monitor therapeutic efficacy  Pharmacist Clinical Goal(s):  Patient will achieve adherence to monitoring guidelines and medication adherence to achieve therapeutic efficacy through collaboration with PharmD and provider.   Interventions: 1:1 collaboration with Audrey Koch, MD regarding development and update of comprehensive plan of care as evidenced by provider attestation and co-signature Inter-disciplinary care team collaboration (see longitudinal plan of care) Comprehensive medication review performed; medication list updated in electronic medical record  Hypertension (BP goal <130/80) -Controlled - BP is at goal in office visits; pt endorses compliance and denies side effects; she is not monitoring BP at home -Current treatment: Losartan 25 mg daily AM Metoprolol succinate 100 mg daily AM Furosemide 20 mg daily PRN (infrequently) Potassium chloride 10 mEq daily PRN w/ lasix -Educated on BP goals and benefits of medications for prevention of heart attack, stroke and kidney damage; -Counseled to monitor BP at home periodically -Recommended to continue current medication  Hyperlipidemia / CAD: (LDL goal < 70) -Controlled  - LDL is at goal; pt endorses compliance and denies side effects -s/p PTCA/stent 2009 -Current treatment: Rosuvastatin 40 mg daily AM Ezetimibe 10 mg daily AM Nitroglycerin 0.4 mg SL prn - never used Clopidogrel 75 mg daily AM -Educated on Cholesterol goals; Benefits of statin for ASCVD risk reduction; -Recommend to continue current medication  Type 1 Diabetes (A1c goal <7%) -Not ideally controlled - A1c was above goal 06/2021; pt was advised per Dr [name] to bolus consistently with meals and try supplemental correction boluses as needed -managed per Dr [name]; metformin and Actos as insulin sensitizers -Current medications: Metformin XR 500 mg - 1 tab AM, 2 tab  PM Pioglitazone 15 mg daily Insulin lispro via Tandem T-slim pump (TDD ~ 65 units) Dexcom G6 -Current home glucose readings: via CGM -Denies hypoglycemic/hyperglycemic symptoms -Current exercise: stationary bike, elliptical sometimes -Educated on A1c and blood sugar goals;  Exercise goal of 150 minutes per week -Recommended to continue current medication  Osteopenia (Goal: prevent fractures) -Controlled -Last DEXA Scan: 11/2014  T-Score femoral neck: -1.7  T-Score lumbar spine: +0.6  10-year probability of major osteoporotic fracture: 15.3%  10-year probability of hip fracture: 2.1% -Patient is not a candidate for pharmacologic treatment -Current treatment  Calcium-Vitamin D 600 mg BID Vitamin D 1000 IU BID -Recommend to continue current medication; repeat DEXA scan is due, pt elected to defer for now  Health Maintenance -Vaccine gaps: none -Current therapy: Ibuprofen 200 mg AM (arthritis in thumb) -Counseled on potential risks of chronic NSAID use including kidney damage, GI bleeding and BP elevations; pt is also taking clopidogrel which increases risk for bleeding -Recommended to limit ibuprofen use and always take with food  Patient Goals/Self-Care Activities Patient will:  - take medications as prescribed -focus on medication adherence by pill box -check glucose via CGM -check blood pressure twice a week -target a minimum of 150 minutes of moderate intensity exercise weekly  -Limit ibuprofen use as much as possible and always take with food       Compliance/Adherence/Medication fill history: Care Gaps: Eye exam (due 09/08/20)  Star-Rating Drugs: Losartan - LF 07/06/21 x 90 ds Rosuvastatin - LF 07/02/21 x 90 ds Pioglitazone - LF 06/20/21 x 90 ds  Medication Assistance: None required.  Patient affirms current coverage meets needs.  Patient's preferred pharmacy is:  [id number] - [place], [place] [address] [place] [address] Phone: [phone] Fax: [phone]  Uses pill box? Yes Pt endorses 100% compliance  We discussed: Current pharmacy is preferred with insurance plan and patient is satisfied with pharmacy services Patient decided to: Continue current medication management strategy  Care Plan and Follow Up Patient Decision:  Patient agrees to Care Plan and Follow-up.  Plan: Telephone follow up appointment with care management team member scheduled for:  6 months  [name], PharmD, [place], CPP Clinical Pharmacist [facility] at [facility] [phone]

## 2021-09-03 DIAGNOSIS — E1065 Type 1 diabetes mellitus with hyperglycemia: Secondary | ICD-10-CM | POA: Diagnosis not present

## 2021-09-03 DIAGNOSIS — I10 Essential (primary) hypertension: Secondary | ICD-10-CM | POA: Diagnosis not present

## 2021-09-03 DIAGNOSIS — M159 Polyosteoarthritis, unspecified: Secondary | ICD-10-CM | POA: Diagnosis not present

## 2021-09-03 DIAGNOSIS — I2510 Atherosclerotic heart disease of native coronary artery without angina pectoris: Secondary | ICD-10-CM | POA: Diagnosis not present

## 2021-09-03 DIAGNOSIS — E785 Hyperlipidemia, unspecified: Secondary | ICD-10-CM

## 2021-09-03 DIAGNOSIS — E1169 Type 2 diabetes mellitus with other specified complication: Secondary | ICD-10-CM | POA: Diagnosis not present

## 2021-09-03 NOTE — Patient Instructions (Signed)
Visit Information  Phone number for Pharmacist: [phone]   Goals Addressed             This Visit's Progress    Manage My Medicine       Timeframe:  Long-Range Goal Priority:  Medium Start Date:       03/02/21                      Expected End Date:   03/02/22                    Follow Up Date April 2023   - call for medicine refill 2 or 3 days before it runs out - call if I am sick and can't take my medicine - keep a list of all the medicines I take; vitamins and herbals too - use a pillbox to sort medicine  -Limit ibuprofen use as much as possible and always take with food   Why is this important?   These steps will help you keep on track with your medicines.   Notes:         Care Plan : [place]  Updates made by Charlton Haws, RPH since 09/03/2021 12:00 AM     Problem: Hypertension, Hyperlipidemia, Diabetes, Coronary Artery Disease, Osteopenia   Priority: High     Long-Range Goal: Disease management   Start Date: 03/02/2021  Expected End Date: 03/02/2022  This Visit's Progress: On track  Recent Progress: On track  Priority: High  Note:   Current Barriers:  Unable to independently monitor therapeutic efficacy  Pharmacist Clinical Goal(s):  Patient will achieve adherence to monitoring guidelines and medication adherence to achieve therapeutic efficacy through collaboration with PharmD and provider.   Interventions: 1:1 collaboration with Hoyt Koch, MD regarding development and update of comprehensive plan of care as evidenced by provider attestation and co-signature Inter-disciplinary care team collaboration (see longitudinal plan of care) Comprehensive medication review performed; medication list updated in electronic medical record  Hypertension (BP goal <130/80) -Controlled - BP is at goal in office visits; pt endorses compliance and denies side effects; she is not monitoring BP at home -Current treatment: Losartan 25  mg daily AM Metoprolol succinate 100 mg daily AM Furosemide 20 mg daily PRN (infrequently) Potassium chloride 10 mEq daily PRN w/ lasix -Educated on BP goals and benefits of medications for prevention of heart attack, stroke and kidney damage; -Counseled to monitor BP at home periodically -Recommended to continue current medication  Hyperlipidemia / CAD: (LDL goal < 70) -Controlled  - LDL is at goal; pt endorses compliance and denies side effects -s/p PTCA/stent 2009 -Current treatment: Rosuvastatin 40 mg daily AM Ezetimibe 10 mg daily AM Nitroglycerin 0.4 mg SL prn - never used Clopidogrel 75 mg daily AM -Educated on Cholesterol goals; Benefits of statin for ASCVD risk reduction; -Recommend to continue current medication  Type 1 Diabetes (A1c goal <7%) -Not ideally controlled - A1c was above goal 06/2021; pt was advised per Dr [name] to bolus consistently with meals and try supplemental correction boluses as needed -managed per Dr [name]; metformin and Actos as insulin sensitizers -Current medications: Metformin XR 500 mg - 1 tab AM, 2 tab PM Pioglitazone 15 mg daily Insulin lispro via Tandem T-slim pump (TDD ~ 65 units) Dexcom G6 -Current home glucose readings: via CGM -Denies hypoglycemic/hyperglycemic symptoms -Current exercise: stationary bike, elliptical sometimes -Educated on A1c and blood sugar goals; Exercise goal of 150 minutes per week -  Recommended to continue current medication  Osteopenia (Goal: prevent fractures) -Controlled -Last DEXA Scan: 11/2014  T-Score femoral neck: -1.7  T-Score lumbar spine: +0.6  10-year probability of major osteoporotic fracture: 15.3%  10-year probability of hip fracture: 2.1% -Patient is not a candidate for pharmacologic treatment -Current treatment  Calcium-Vitamin D 600 mg BID Vitamin D 1000 IU BID -Recommend to continue current medication; repeat DEXA scan is due, pt elected to defer for now  Health Maintenance -Vaccine gaps:  none -Current therapy: Ibuprofen 200 mg AM (arthritis in thumb) -Counseled on potential risks of chronic NSAID use including kidney damage, GI bleeding and BP elevations; pt is also taking clopidogrel which increases risk for bleeding -Recommended to limit ibuprofen use and always take with food  Patient Goals/Self-Care Activities Patient will:  - take medications as prescribed -focus on medication adherence by pill box -check glucose via CGM -check blood pressure twice a week -target a minimum of 150 minutes of moderate intensity exercise weekly  -Limit ibuprofen use as much as possible and always take with food -Schedule repeat Bone Scan (DEXA) when able       Patient verbalizes understanding of instructions provided today and agrees to view in [place].  Telephone follow up appointment with pharmacy team member scheduled for: 6 months  [name], PharmD, [place], CPP Clinical Pharmacist [facility] at [facility] [phone]

## 2021-10-10 ENCOUNTER — Encounter: Payer: Self-pay | Admitting: Internal Medicine

## 2021-10-10 DIAGNOSIS — E119 Type 2 diabetes mellitus without complications: Secondary | ICD-10-CM | POA: Diagnosis not present

## 2021-10-10 LAB — HM DIABETES EYE EXAM

## 2021-10-11 ENCOUNTER — Other Ambulatory Visit (INDEPENDENT_AMBULATORY_CARE_PROVIDER_SITE_OTHER): Payer: Medicare Other

## 2021-10-11 ENCOUNTER — Other Ambulatory Visit: Payer: Self-pay

## 2021-10-11 DIAGNOSIS — E1065 Type 1 diabetes mellitus with hyperglycemia: Secondary | ICD-10-CM

## 2021-10-11 DIAGNOSIS — E782 Mixed hyperlipidemia: Secondary | ICD-10-CM | POA: Diagnosis not present

## 2021-10-11 LAB — COMPREHENSIVE METABOLIC PANEL
ALT: 29 U/L (ref 0–35)
AST: 23 U/L (ref 0–37)
Albumin: 3.6 g/dL (ref 3.5–5.2)
Alkaline Phosphatase: 87 U/L (ref 39–117)
BUN: 16 mg/dL (ref 6–23)
CO2: 30 mEq/L (ref 19–32)
Calcium: 9.5 mg/dL (ref 8.4–10.5)
Chloride: 104 mEq/L (ref 96–112)
Creatinine, Ser: 0.79 mg/dL (ref 0.40–1.20)
GFR: 74 mL/min (ref >60.00)
Glucose, Bld: 115 mg/dL — ABNORMAL HIGH (ref 70–99)
Potassium: 4.9 mEq/L (ref 3.5–5.1)
Sodium: 139 mEq/L (ref 135–145)
Total Bilirubin: 0.4 mg/dL (ref 0.2–1.2)
Total Protein: 6 g/dL (ref 6.0–8.3)

## 2021-10-11 LAB — TSH: TSH: 2.33 u[IU]/mL (ref 0.35–5.50)

## 2021-10-11 LAB — LIPID PANEL
Cholesterol: 104 mg/dL (ref 0–200)
HDL: 46.5 mg/dL (ref >39.00)
LDL Cholesterol: 43 mg/dL (ref 0–99)
NonHDL: 57.93
Total CHOL/HDL Ratio: 2
Triglycerides: 76 mg/dL (ref 0.0–149.0)
VLDL: 15.2 mg/dL (ref 0.0–40.0)

## 2021-10-11 LAB — HEMOGLOBIN A1C: Hgb A1c MFr Bld: 6.8 % — ABNORMAL HIGH (ref 4.6–6.5)

## 2021-10-17 ENCOUNTER — Encounter: Payer: Medicare Other | Admitting: Endocrinology

## 2021-10-17 ENCOUNTER — Encounter: Payer: Self-pay | Admitting: Endocrinology

## 2021-10-17 ENCOUNTER — Ambulatory Visit: Payer: Medicare Other | Admitting: Endocrinology

## 2021-10-17 ENCOUNTER — Other Ambulatory Visit: Payer: Self-pay

## 2021-10-17 NOTE — Progress Notes (Deleted)
This encounter was created in error - please disregard.

## 2021-10-17 NOTE — Progress Notes (Signed)
This encounter was created in error - please disregard.

## 2021-10-17 NOTE — Addendum Note (Signed)
Addended by: Elayne Snare on: 10/17/2021 10:31 AM   Modules accepted: Orders, Level of Service

## 2021-10-24 ENCOUNTER — Encounter: Payer: Self-pay | Admitting: Endocrinology

## 2021-11-01 ENCOUNTER — Other Ambulatory Visit: Payer: Self-pay

## 2021-11-01 ENCOUNTER — Encounter: Payer: Medicare Other | Admitting: Endocrinology

## 2021-11-01 NOTE — Progress Notes (Signed)
This encounter was created in error - please disregard.

## 2021-11-02 ENCOUNTER — Emergency Department (HOSPITAL_BASED_OUTPATIENT_CLINIC_OR_DEPARTMENT_OTHER)
Admission: EM | Admit: 2021-11-02 | Discharge: 2021-11-02 | Disposition: A | Discharge status: Home / Self Care | Payer: Medicare Other | Attending: Emergency Medicine | Admitting: Emergency Medicine

## 2021-11-02 ENCOUNTER — Other Ambulatory Visit: Payer: Self-pay

## 2021-11-02 ENCOUNTER — Encounter (HOSPITAL_BASED_OUTPATIENT_CLINIC_OR_DEPARTMENT_OTHER): Payer: Self-pay | Admitting: Emergency Medicine

## 2021-11-02 DIAGNOSIS — L02212 Cutaneous abscess of back [any part, except buttock]: Secondary | ICD-10-CM | POA: Diagnosis not present

## 2021-11-02 DIAGNOSIS — Z5321 Procedure and treatment not carried out due to patient leaving prior to being seen by health care provider: Secondary | ICD-10-CM | POA: Insufficient documentation

## 2021-11-02 NOTE — ED Triage Notes (Signed)
Abscess to upper back x 1 week

## 2021-11-02 NOTE — ED Notes (Signed)
Pt seen walking out with clothes on after telling the tech that she "will go somewhere else"

## 2021-11-06 DIAGNOSIS — L720 Epidermal cyst: Secondary | ICD-10-CM | POA: Diagnosis not present

## 2021-11-12 ENCOUNTER — Other Ambulatory Visit: Payer: Self-pay

## 2021-11-12 ENCOUNTER — Ambulatory Visit (INDEPENDENT_AMBULATORY_CARE_PROVIDER_SITE_OTHER): Payer: Medicare Other | Admitting: Endocrinology

## 2021-11-12 ENCOUNTER — Encounter: Payer: Self-pay | Admitting: Endocrinology

## 2021-11-12 VITALS — BP 128/58 | HR 76 | Ht 69.5 in | Wt 218.4 lb

## 2021-11-12 DIAGNOSIS — E1065 Type 1 diabetes mellitus with hyperglycemia: Secondary | ICD-10-CM

## 2021-11-12 DIAGNOSIS — E782 Mixed hyperlipidemia: Secondary | ICD-10-CM | POA: Diagnosis not present

## 2021-11-12 NOTE — Progress Notes (Signed)
Patient ID: [name], female   DOB: 02/04/1946, 76 y.o.   MRN: [id number]   Reason for visit: DIABETES followup.  Diagnosis: Type 1 diabetes mellitus, date of diagnosis: 1979.    HISTORY of present illness:   Insulin Pump: CURRENT brand: T-Slim since 08/2020  BASAL settings:  Midnight = 0.55, 3:30 AM = 1.2, 6 AM = 2.25, 10 AM = 0.75,  2 PM = 1.25, 5 PM = 2.1, 7:30 PM = 2.1, 8 PM = 3.2 and 10 PM = 1.8.    Total daily insulin = usually about 65 units, maximum 90, using Lyumjev   Carbohydrate ratio 1: 12 breakfast, 1: 5 lunch and 1: 4.5 at dinner; sensitivity MN= 1:30; 1: 25 from 5 AM-7 AM, 7 AM-12 AM = 25 target 120-130  Insulin on board indicator on, duration 4 hrs   DIABETES: She has had long-standing diabetes with A1c levels usually above target. In 10/13 pioglitazone was restarted because of her large insulin requirement and diabetic dyslipidemia.  Her blood sugars improved significantly with this.  Also has been taking metformin as insulin sensitizer  RECENT history:   Her A1c is back down below 7% and now 6.8 compared to 7.2  Current diabetes management, blood sugar patterns from sensor and pump download:  Insulin pump management management: She has in the last couple of weeks the same time in target of 77% as before  Only change on the last visit was correction factor of 1: 25 She has difficulty getting consistent coverage for her meals in the evenings and sometimes at lunch, this is a combination of likely inadequate boluses for higher fat meals, delayed or missed boluses and possibly inaccurate calculation of carbohydrates She tends to have fairly good blood sugars when she has a good amount of protein with low fat with her meals and occasionally blood sugar may be low normal after the bolus Highest blood sugars are discussed below are after the evening meal She also says that she will feel hypoglycemic if she is late for eating breakfast in the morning  especially if blood sugars are near 100 waking up No specific exercise except taking care of her grandchild and this may be occasionally causing low normal sugars She is using the sleep mode after discussion with the diabetes educator and this seems to be working well but still has a couple of high nights Does not appear to be getting enough manual correction boluses for high readings especially postprandial   Her blood sugar analysis from the Dexcom data for the last 2 weeks is interpreted as below  Her blood sugars are on an average within the target range at all times except between about 7 PM-10 PM when they are higher  HIGHEST blood sugars are averaging about 180-210  HYPERGLYCEMIA episodes are generally occurring after 6 PM but occasionally during the afternoons postprandially but not consistently  HYPOGLYCEMIA has been minimal but tends to occur either around 5-6 PM or occasionally around midnight and 7 AM, usually transient  OVERNIGHT blood sugars are generally fairly stable within the target range but may be low normal early morning between 6-8 AM with minimal hypoglycemia  POSTPRANDIAL readings are on an average fairly well controlled after breakfast and lunch but periodically does show significantly high readings after lunch as much as 300  Postprandial readings are mostly higher after dinner but no consistent pattern is seen and on occasion may be low normal after the bolus    Her CGM  shows the following data for the last 2 weeks:    CGM use % of time 95  2-week average/SD 152  Time in range 77  % Time Above 180 22  % Time above 250   % Time Below 70 1      PRE-MEAL  overnight  mornings  afternoon  evening Overall  Glucose range:       Averages: 150 129 149  180    Previously:   CGM use % of time 93  2-week average/SD 153  Time in range 77     %  % Time Above 180 22  % Time above 250   % Time Below 70 1      PRE-MEAL  overnight  mornings  afternoon  evening  Overall  Glucose range:       Averages: 168 150 145 149       Hypoglycemic awareness: Has symptoms of feeling tired, headache, sweaty.  She thinks he can usually recognize symptoms  Sometimes may not be aware during the night and will be notified by her sensor that the sugar is low  She has symptoms when blood glucose is less than 60. Uses glucose tablets for treatment even overnight     DIET has been usually fairly controlled with carbohydrate and fat intake, tends to eat out on weekends The mealtimes are variable.    Food preferences: Yogurt and cottage cheese for breakfast usually.   Blood sugars with exercise: No significant problems recently with adequate control and no hypoglycemia  .  Wt Readings from Last 3 Encounters:  11/12/21 218 lb 6.4 oz (99.1 kg)  11/02/21 216 lb (98 kg)  10/17/21 217 lb 9.6 oz (98.7 kg)    LABS:  Lab Results  Component Value Date   HGBA1C 6.8 (H) 10/11/2021   HGBA1C 7.2 (H) 06/15/2021   HGBA1C 6.7 (A) 02/28/2021   Lab Results  Component Value Date   MICROALBUR <0.7 06/15/2021   LDLCALC 43 10/11/2021   CREATININE 0.79 10/11/2021    No visits with results within 1 Week(s) from this visit.  Latest known visit with results is:  Lab on 10/11/2021  Component Date Value Ref Range Status   TSH 10/11/2021 2.33  0.35 - 5.50 uIU/mL Final   Cholesterol 10/11/2021 104  0 - 200 mg/dL Final   ATP III Classification       Desirable:  < 200 mg/dL               Borderline High:  200 - 239 mg/dL          High:  > = 240 mg/dL   Triglycerides 10/11/2021 76.0  0.0 - 149.0 mg/dL Final   Normal:  <150 mg/dLBorderline High:  150 - 199 mg/dL   HDL 10/11/2021 46.50  >39.00 mg/dL Final   VLDL 10/11/2021 15.2  0.0 - 40.0 mg/dL Final   LDL Cholesterol 10/11/2021 43  0 - 99 mg/dL Final   Total CHOL/HDL Ratio 10/11/2021 2   Final                  Men          Women1/2 Average Risk     3.4          3.3Average Risk          5.0          4.42X Average Risk           9.6  7.13X Average Risk          15.0          11.0                       NonHDL 10/11/2021 57.93   Final   NOTE:  Non-HDL goal should be 30 mg/dL higher than patient's LDL goal (i.e. LDL goal of < 70 mg/dL, would have non-HDL goal of < 100 mg/dL)   Sodium 10/11/2021 139  135 - 145 mEq/L Final   Potassium 10/11/2021 4.9  3.5 - 5.1 mEq/L Final   Chloride 10/11/2021 104  96 - 112 mEq/L Final   CO2 10/11/2021 30  19 - 32 mEq/L Final   Glucose, Bld 10/11/2021 115 (H)  70 - 99 mg/dL Final   BUN 10/11/2021 16  6 - 23 mg/dL Final   Creatinine, Ser 10/11/2021 0.79  0.40 - 1.20 mg/dL Final   Total Bilirubin 10/11/2021 0.4  0.2 - 1.2 mg/dL Final   Alkaline Phosphatase 10/11/2021 87  39 - 117 U/L Final   AST 10/11/2021 23  0 - 37 U/L Final   ALT 10/11/2021 29  0 - 35 U/L Final   Total Protein 10/11/2021 6.0  6.0 - 8.3 g/dL Final   Albumin 10/11/2021 3.6  3.5 - 5.2 g/dL Final   GFR 10/11/2021 74.00  >60.00 mL/min Final   Calculated using the CKD-EPI Creatinine Equation (2021)   Calcium 10/11/2021 9.5  8.4 - 10.5 mg/dL Final   Hgb A1c MFr Bld 10/11/2021 6.8 (H)  4.6 - 6.5 % Final   Glycemic Control Guidelines for People with Diabetes:Non Diabetic:  <6%Goal of Therapy: <7%Additional Action Suggested:  >8%      Allergies as of 11/12/2021       Reactions   Sulfa Antibiotics Rash   Sulfamethoxazole-trimethoprim Rash        Medication List        Accurate as of November 12, 2021  2:29 PM. If you have any questions, ask your nurse or doctor.          ASPIRIN 81 PO Take by mouth.   benzonatate 100 MG capsule Commonly known as: Tessalon Perles 1-2 tablets twice daily as needed for cough   CALCIUM + D PO Take 600 mg by mouth 2 (two) times daily.   cholecalciferol 1000 units tablet Commonly known as: VITAMIN D Take 1,000 Units by mouth 2 (two) times daily.   clopidogrel 75 MG tablet Commonly known as: PLAVIX Take 1 tablet (75 mg total) by mouth daily.   Dexcom G6 Sensor  Misc by Does not apply route.   ezetimibe 10 MG tablet Commonly known as: ZETIA TAKE 1 TABLET DAILY   furosemide 20 MG tablet Commonly known as: LASIX Take 1 tablet (20 mg total) by mouth as needed. Take 1 tablet by mouth as needed for 5 days. What changed: additional instructions   ibuprofen 200 MG tablet Commonly known as: ADVIL Take 200 mg by mouth every 6 (six) hours as needed.   ICAPS AREDS 2 PO Take 2 tablets by mouth daily.   losartan 25 MG tablet Commonly known as: COZAAR TAKE 1 TABLET DAILY   Lyumjev 100 UNIT/ML Soln Generic drug: Insulin Lispro-aabc USE A MAXIMUM OF 90 UNITS DAILY VIA INSULIN PUMP   metFORMIN 500 MG 24 hr tablet Commonly known as: GLUCOPHAGE-XR TAKE 1 TABLET IN THE MORNING AND 2 TABLETS IN THE EVENING AS DIRECTED   metoprolol succinate 100 MG  24 hr tablet Commonly known as: TOPROL-XL TAKE 1 TABLET DAILY   nitroGLYCERIN 0.4 MG SL tablet Commonly known as: Nitrostat Place 1 tablet (0.4 mg total) under the tongue every 5 (five) minutes as needed for chest pain.   pioglitazone 30 MG tablet Commonly known as: Actos Take 1 tablet (30 mg total) by mouth daily.   potassium chloride 10 MEQ tablet Commonly known as: KLOR-CON Take 1 tablet (10 mEq total) by mouth daily as needed (take with Lasix).   rosuvastatin 40 MG tablet Commonly known as: CRESTOR TAKE 1 TABLET DAILY        Allergies:  Allergies  Allergen Reactions   Sulfa Antibiotics Rash   Sulfamethoxazole-Trimethoprim Rash    Past Medical History:  Diagnosis Date   Arthritis    OA   Atrial flutter ([place])    a. s/p TEE/DCCV 08/16/13 (normal LV function, no LAA thrombus).b. s/p ablation by Dr Lovena Le 09-23-2013   Coronary artery disease    2009 LAD Promus stent   Dysrhythmia    Aflutter, no issues after ablation 2014   Fibroid    Hyperlipidemia    Insulin pump in place    Osteopenia    Type 1 diabetes mellitus on insulin therapy ([place])    Valvular heart disease    a. Mild  MR/TR by TEE 08/2013.    Past Surgical History:  Procedure Laterality Date   ABLATION  09-23-2013   CTI by Dr Lovena Le   APPENDECTOMY     APPLICATION OF WOUND VAC Left 04/11/2017   Procedure: APPLICATION OF WOUND VAC LEFT KNEE;  Surgeon: Rod Can, MD;  Location: [facility];  Service: Orthopedics;  Laterality: Left;   ATRIAL FLUTTER ABLATION N/A 09/23/2013   Procedure: ATRIAL FLUTTER ABLATION;  Surgeon: Evans Lance, MD;  Location: [facility] CATH LAB;  Service: Cardiovascular;  Laterality: N/A;   CARDIOVERSION N/A 08/16/2013   Procedure: CARDIOVERSION;  Surgeon: Thayer Headings, MD;  Location: [facility];  Service: Cardiovascular;  Laterality: N/A;  spoke with Tom   CATARACT EXTRACTION Bilateral    COLONOSCOPY     CORONARY ANGIOPLASTY WITH STENT [place]   ORIF PATELLA Left 04/11/2017   ORIF PATELLA Left 04/11/2017   Procedure: OPEN REDUCTION INTERNAL (ORIF) FIXATION PATELLA LEFT KNEE;  Surgeon: Rod Can, MD;  Location: [facility];  Service: Orthopedics;  Laterality: Left;   PELVIC LAPAROSCOPY     TEE WITHOUT CARDIOVERSION N/A 08/16/2013   Procedure: TRANSESOPHAGEAL ECHOCARDIOGRAM (TEE);  Surgeon: Thayer Headings, MD;  Location: [facility] ENDOSCOPY;  Service: Cardiovascular;  Laterality: N/A;    Family History  Problem Relation Age of Onset   CAD Father 66       died age 58   CAD Brother 36       small vessel disease   Atrial fibrillation Mother    Diabetes Mother    Hypertension Mother    CAD Cousin        paternal   CAD Paternal Grandfather        early onset   Colon cancer Neg Hx     Social History:  reports that she has never smoked. She has never used smokeless tobacco. She reports current alcohol use of about 5.0 standard drinks per week. She reports that she does not use drugs.  REVIEW of systems:   Cardiac function: Her echo shows only grade 1 diastolic dysfunction and normal systolic function  She has had atrial arrhythmias treated with  metoprolol  Blood pressure usually normal, on 25 mg losartan from cardiologist for preventive reasons   BP Readings from Last 3 Encounters:  11/12/21 (!) 128/58  11/02/21 (!) 146/84  10/17/21 122/72     She has had diabetic dyslipidemia with significantly high LDL particle number This has been well controlled with Crestor 40 mg and Niaspan 1000 mg Her last LDL particle number was down to below 900, labs pending from today  Niacin was stopped because of tending to get flushing and itching which may be late at night, not generally prevented by aspirin, also she finds the regimen difficult to do at night  She has been consistent with generic Crestor 40 and Zetia without side effects This is still controlling HDL and improved particle number  Zetia added in 3/19  Labs as follows:  Lab Results  Component Value Date   CHOL 104 10/11/2021   CHOL 104 02/28/2021   CHOL 100 07/19/2020   Lab Results  Component Value Date   HDL 46.50 10/11/2021   HDL 47.10 02/28/2021   HDL 47.60 07/19/2020   Lab Results  Component Value Date   LDLCALC 43 10/11/2021   LDLCALC 39 02/28/2021   LDLCALC 40 07/19/2020   Lab Results  Component Value Date   TRIG 76.0 10/11/2021   TRIG 89.0 02/28/2021   TRIG 63.0 07/19/2020   Lab Results  Component Value Date   CHOLHDL 2 10/11/2021   CHOLHDL 2 02/28/2021   CHOLHDL 2 07/19/2020   Lab Results  Component Value Date   LDLDIRECT 79.0 01/11/2015   LDLDIRECT 73.1 10/13/2014      She has had consistently normal thyroid levels  Lab Results  Component Value Date   TSH 2.33 10/11/2021   TSH 2.23 10/06/2019   TSH 1.86 01/19/2019    No retinopathy on last exam    BP (!) 128/58    Pulse 76    Ht 5' 9.5" (1.765 m)    Wt 218 lb 6.4 oz (99.1 kg)    SpO2 97%    BMI 31.79 kg/m      ASSESSMENT/PLAN:  DIABETES type 1 on T-slim insulin pump  Her A1c is 6.8 compared to 7.2   See history of present illness for detailed discussion of her  current blood sugar patterns and management with her pump  Her insulin pump download and Dexcom data on the download was reviewed and interpreted as above  Blood glucose is again within target range 77% of the time although periodically higher after dinner She likely can get a better postprandial control with bolusing consistently before eating, adding extra for higher fat meals and making corrections for any unusually high readings, she will also need to make sure she is looking of her carbohydrates accurately  She may tend to have low normal or slightly low readings early morning or before dinner as discussed above  Recommendations:  Basal rate will be changed as follows: 6 AM-10 AM = 2.20 and 3:30 PM = 1.9 She will try to make sure she boluses ahead of time when eating Add extra 30% approximately to her carbohydrate calculation for any higher fat meals Make sure she boluses consistently to correct any unusually high readings especially postprandially Call if getting low sugars Use activity mode if needed for any exercise or periods when she is more physically active  Hyperlipidemia: All parameters including HDL well controlled on Zetia and Crestor   Follow-up in 4 months   There are no Patient Instructions on file for  this visit.   Elayne Snare 11/12/21   Note: This office note was prepared with Dragon voice recognition system technology. Any transcriptional errors that result from this process are unintentional.

## 2021-11-27 ENCOUNTER — Encounter: Payer: Self-pay | Admitting: Endocrinology

## 2021-12-10 ENCOUNTER — Other Ambulatory Visit: Payer: Self-pay | Admitting: Cardiovascular Disease

## 2021-12-24 ENCOUNTER — Encounter: Payer: Self-pay | Admitting: Cardiovascular Disease

## 2021-12-24 ENCOUNTER — Encounter: Payer: Self-pay | Admitting: Internal Medicine

## 2021-12-24 ENCOUNTER — Encounter: Payer: Self-pay | Admitting: Endocrinology

## 2021-12-24 DIAGNOSIS — I10 Essential (primary) hypertension: Secondary | ICD-10-CM

## 2021-12-24 DIAGNOSIS — E1065 Type 1 diabetes mellitus with hyperglycemia: Secondary | ICD-10-CM

## 2021-12-24 MED ORDER — PIOGLITAZONE HCL 30 MG PO TABS: 30.0000 mg | ORAL_TABLET | Freq: Every day | ORAL | 3 refills | Status: DC

## 2021-12-24 MED ORDER — CLOPIDOGREL BISULFATE 75 MG PO TABS: 75.0000 mg | ORAL_TABLET | Freq: Every day | ORAL | 0 refills | Status: DC

## 2021-12-24 MED ORDER — ROSUVASTATIN CALCIUM 40 MG PO TABS: 40.0000 mg | ORAL_TABLET | Freq: Every day | ORAL | 3 refills | Status: DC

## 2021-12-24 MED ORDER — METOPROLOL SUCCINATE ER 100 MG PO TB24: ORAL_TABLET | ORAL | 0 refills | Status: DC

## 2021-12-24 MED ORDER — METFORMIN HCL ER 500 MG PO TB24: ORAL_TABLET | ORAL | 3 refills | Status: DC

## 2021-12-25 ENCOUNTER — Encounter: Payer: Self-pay | Admitting: Endocrinology

## 2021-12-25 ENCOUNTER — Telehealth: Payer: Self-pay

## 2021-12-25 MED ORDER — LOSARTAN POTASSIUM 25 MG PO TABS: 25.0000 mg | ORAL_TABLET | Freq: Every day | ORAL | 2 refills | Status: DC

## 2021-12-25 NOTE — Addendum Note (Signed)
Addended by: Cinda Quest on: 12/25/2021 03:55 PM   Modules accepted: Orders

## 2021-12-25 NOTE — Telephone Encounter (Signed)
[facility] faxed requesting chart notes to be sent. Faxed Electronically

## 2022-01-08 ENCOUNTER — Encounter: Payer: Self-pay | Admitting: Internal Medicine

## 2022-01-08 ENCOUNTER — Other Ambulatory Visit: Payer: Self-pay

## 2022-01-08 ENCOUNTER — Ambulatory Visit (INDEPENDENT_AMBULATORY_CARE_PROVIDER_SITE_OTHER): Payer: Medicare Other | Admitting: Internal Medicine

## 2022-01-08 VITALS — BP 134/70 | HR 72 | Temp 97.9°F | Ht 69.5 in | Wt 220.0 lb

## 2022-01-08 DIAGNOSIS — N393 Stress incontinence (female) (male): Secondary | ICD-10-CM

## 2022-01-08 DIAGNOSIS — E108 Type 1 diabetes mellitus with unspecified complications: Secondary | ICD-10-CM | POA: Diagnosis not present

## 2022-01-08 DIAGNOSIS — R300 Dysuria: Secondary | ICD-10-CM

## 2022-01-08 DIAGNOSIS — H9193 Unspecified hearing loss, bilateral: Secondary | ICD-10-CM | POA: Diagnosis not present

## 2022-01-08 MED ORDER — SOLIFENACIN SUCCINATE 5 MG PO TABS: 5.0000 mg | ORAL_TABLET | Freq: Every day | ORAL | 1 refills | Status: DC

## 2022-01-08 NOTE — Assessment & Plan Note (Addendum)
Checking U/A and culture today to rule out infection. Having more incontinence and urgency lately with mild discomfort. Treat as appropriate. ?

## 2022-01-08 NOTE — Progress Notes (Signed)
? ?  Subjective:  ? ?Patient ID: [name], female    DOB: Jul 14, 1946, 76 y.o.   MRN: [id number] ? ?HPI ?The patient is a 76 YO female coming in for concerns. ? ?Review of Systems  ?Constitutional:  Positive for unexpected weight change.  ?HENT: Negative.    ?Eyes: Negative.   ?Respiratory:  Negative for cough, chest tightness and shortness of breath.   ?Cardiovascular:  Negative for chest pain, palpitations and leg swelling.  ?Gastrointestinal:  Negative for abdominal distention, abdominal pain, constipation, diarrhea, nausea and vomiting.  ?Genitourinary:  Positive for frequency and urgency.  ?Musculoskeletal: Negative.   ?Skin: Negative.   ?Neurological: Negative.   ?Psychiatric/Behavioral: Negative.    ? ?Objective:  ?Physical Exam ?Constitutional:   ?   Appearance: She is well-developed.  ?HENT:  ?   Head: Normocephalic and atraumatic.  ?Cardiovascular:  ?   Rate and Rhythm: Normal rate and regular rhythm.  ?Pulmonary:  ?   Effort: Pulmonary effort is normal. No respiratory distress.  ?   Breath sounds: Normal breath sounds. No wheezing or rales.  ?Abdominal:  ?   General: Bowel sounds are normal. There is no distension.  ?   Palpations: Abdomen is soft.  ?   Tenderness: There is no abdominal tenderness. There is no rebound.  ?Musculoskeletal:  ?   Cervical back: Normal range of motion.  ?Skin: ?   General: Skin is warm and dry.  ?Neurological:  ?   Mental Status: She is alert and oriented to person, place, and time.  ?   Coordination: Coordination normal.  ? ? ?Vitals:  ? 01/08/22 1031  ?BP: 134/70  ?Pulse: 72  ?Temp: 97.9 ?F (36.6 ?C)  ?TempSrc: Oral  ?SpO2: 94%  ?Weight: 220 lb (99.8 kg)  ?Height: 5' 9.5" (1.765 m)  ? ? ?This visit occurred during the SARS-CoV-2 public health emergency.  Safety protocols were in place, including screening questions prior to the visit, additional usage of staff PPE, and extensive cleaning of exam room while observing appropriate contact time as indicated for disinfecting  solutions.  ? ?Assessment & Plan:  ? ?

## 2022-01-08 NOTE — Assessment & Plan Note (Signed)
Ruling out infection. Previously tried myrbetriq without benefit. Rx vesicare 5 mg daily and if no improvement in 1 month will increase dose to 10 mg daily vesicare.  ?

## 2022-01-08 NOTE — Assessment & Plan Note (Signed)
She is struggling with attempting to lose weight. She wishes to consult with her endo provider before making change. We discussed possibly stopping actos and adding rybelsus to help encourage weight loss. She will continue on metformin, insulin pump, and actos for now at current dosing.  ?

## 2022-01-08 NOTE — Assessment & Plan Note (Signed)
Referral to audiology today. 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 ?

## 2022-01-08 NOTE — Patient Instructions (Addendum)
We have sent in vesicare to take 1 pill daily. ? ?Give this 2-3 weeks and if no improvement it does come in a stronger dose. ? ?We will get you in with the audiologist. ? ?Think about switching actos for rybelsus to help with weight loss. ?

## 2022-01-14 NOTE — Progress Notes (Signed)
[facility] [address] [place] [address] Phone: [phone] Subjective:   [name], am serving as a scribe for Dr. Antoine Primas.  This visit occurred during the SARS-CoV-2 public health emergency.  Safety protocols were in place, including screening questions prior to the visit, additional usage of staff PPE, and extensive cleaning of exam room while observing appropriate contact time as indicated for disinfecting solutions.    I'm seeing this patient by the request  of:  Myrlene Broker, MD  CC: R thumb and back pain  [id number]  [name] is a 76 y.o. female coming in with complaint of R thumb and back pain. Last seen in 2020 for elbow pain. Patient states that she has pain with chopping foods and wrapping packages. Using Advil and Voltaren. Also notes pain in L CMC but worse in R side. Does wear gloves which have helped.   Pain in back is on left side. Walking her dogs increases her pain. Holds leash in R hand.   R thumb: -Swelling: -Aggravating factors: -Treatments tried:  Low back pain: -Radiating pain: -LE paresthesias: -Aggravating factors: -Treatments tried:      Past Medical History:  Diagnosis Date   Arthritis    OA   Atrial flutter (HCC)    a. s/p TEE/DCCV 08/16/13 (normal LV function, no LAA thrombus).b. s/p ablation by Dr Ladona Ridgel 09-23-2013   Coronary artery disease    2009 LAD Promus stent   Dysrhythmia    Aflutter, no issues after ablation 2014   Fibroid    Hyperlipidemia    Insulin pump in place    Osteopenia    Type 1 diabetes mellitus on insulin therapy (HCC)    Valvular heart disease    a. Mild MR/TR by TEE 08/2013.   Past Surgical History:  Procedure Laterality Date   ABLATION  09-23-2013   CTI by Dr Ladona Ridgel   APPENDECTOMY     APPLICATION OF WOUND VAC Left 04/11/2017   Procedure: APPLICATION OF WOUND VAC LEFT KNEE;  Surgeon: Samson Frederic, MD;  Location: [facility] OR;  Service:  Orthopedics;  Laterality: Left;   ATRIAL FLUTTER ABLATION N/A 09/23/2013   Procedure: ATRIAL FLUTTER ABLATION;  Surgeon: Marinus Maw, MD;  Location: [facility] CATH LAB;  Service: Cardiovascular;  Laterality: N/A;   CARDIOVERSION N/A 08/16/2013   Procedure: CARDIOVERSION;  Surgeon: Vesta Mixer, MD;  Location: [facility] ENDOSCOPY;  Service: Cardiovascular;  Laterality: N/A;  spoke with Tom   CATARACT EXTRACTION Bilateral    COLONOSCOPY     CORONARY ANGIOPLASTY WITH STENT PLACEMENT     MYOMECTOMY  1977   ORIF PATELLA Left 04/11/2017   ORIF PATELLA Left 04/11/2017   Procedure: OPEN REDUCTION INTERNAL (ORIF) FIXATION PATELLA LEFT KNEE;  Surgeon: Samson Frederic, MD;  Location: [facility] OR;  Service: Orthopedics;  Laterality: Left;   PELVIC LAPAROSCOPY     TEE WITHOUT CARDIOVERSION N/A 08/16/2013   Procedure: TRANSESOPHAGEAL ECHOCARDIOGRAM (TEE);  Surgeon: Vesta Mixer, MD;  Location: [facility] ENDOSCOPY;  Service: Cardiovascular;  Laterality: N/A;   Social History   Socioeconomic History   Marital status: Married    Spouse name: Not on file   Number of children: 2   Years of education: Not on file   Highest education level: Not on file  Occupational History   Occupation: home maker    Comment: Children adopted  Tobacco Use   Smoking status: Never   Smokeless tobacco: Never  Vaping Use  Vaping Use: Never used  Substance and Sexual Activity   Alcohol use: Yes    Alcohol/week: 5.0 standard drinks    Types: 5 Glasses of wine per week    Comment: on weekends   Drug use: No   Sexual activity: Yes    Birth control/protection: Post-menopausal  Other Topics Concern   Not on file  Social History Narrative   Not on file   Social Determinants of Health   Financial Resource Strain: Low Risk    Difficulty of Paying Living Expenses: Not hard at all  Food Insecurity: Not on file  Transportation Needs: Not on file  Physical Activity: Not on file  Stress: Not on file  Social Connections: Socially  Integrated   Frequency of Communication with Friends and Family: More than three times a week   Frequency of Social Gatherings with Friends and Family: Twice a week   Attends Religious Services: More than 4 times per year   Active Member of [employer] or Organizations: Yes   Attends Engineer, structural: More than 4 times per year   Marital Status: Married   Allergies  Allergen Reactions   Sulfa Antibiotics Rash   Sulfamethoxazole-Trimethoprim Rash   Family History  Problem Relation Age of Onset   CAD Father 52       died age 55   CAD Brother 30       small vessel disease   Atrial fibrillation Mother    Diabetes Mother    Hypertension Mother    CAD Cousin        paternal   CAD Paternal Grandfather        early onset   Colon cancer Neg Hx     Current Outpatient Medications (Endocrine & Metabolic):    Insulin Lispro-aabc (LYUMJEV) 100 UNIT/ML SOLN, USE A MAXIMUM OF 90 UNITS DAILY VIA INSULIN PUMP   metFORMIN (GLUCOPHAGE-XR) 500 MG 24 hr tablet, TAKE 1 TABLET IN THE MORNING AND 2 TABLETS IN THE EVENING AS DIRECTED   pioglitazone (ACTOS) 30 MG tablet, Take 1 tablet (30 mg total) by mouth daily.  Current Outpatient Medications (Cardiovascular):    ezetimibe (ZETIA) 10 MG tablet, TAKE 1 TABLET DAILY   furosemide (LASIX) 20 MG tablet, Take 1 tablet (20 mg total) by mouth as needed. Take 1 tablet by mouth as needed for 5 days. (Patient taking differently: Take 20 mg by mouth as needed.)   losartan (COZAAR) 25 MG tablet, Take 1 tablet (25 mg total) by mouth daily.   metoprolol succinate (TOPROL-XL) 100 MG 24 hr tablet, TAKE 1 TABLET DAILY.   nitroGLYCERIN (NITROSTAT) 0.4 MG SL tablet, DISSOLVE 1 TABLET UNDER THE TONGUE EVERY 5 MINUTES AS NEEDED FOR CHEST PAIN   rosuvastatin (CRESTOR) 40 MG tablet, Take 1 tablet (40 mg total) by mouth daily.  Current Outpatient Medications (Respiratory):    benzonatate (TESSALON PERLES) 100 MG capsule, 1-2 tablets twice daily as needed for  cough  Current Outpatient Medications (Analgesics):    ASPIRIN 81 PO, Take by mouth.   ibuprofen (ADVIL) 200 MG tablet, Take 200 mg by mouth every 6 (six) hours as needed.  Current Outpatient Medications (Hematological):    clopidogrel (PLAVIX) 75 MG tablet, Take 1 tablet (75 mg total) by mouth daily. Please keep upcoming appt in March 2023 withy Dr. Elease Hashimoto before anymore refills. Thank you Final Attempt  Current Outpatient Medications (Other):    Calcium Carbonate-Vitamin D (CALCIUM + D PO), Take 600 mg by mouth 2 (two) times daily.  cholecalciferol (VITAMIN D) 1000 units tablet, Take 1,000 Units by mouth 2 (two) times daily.    Continuous Blood Gluc Sensor (DEXCOM G6 SENSOR) MISC, by Does not apply route.   gabapentin (NEURONTIN) 100 MG capsule, Take 2 capsules (200 mg total) by mouth at bedtime.   Multiple Vitamins-Minerals (ICAPS AREDS 2 PO), Take 2 tablets by mouth daily.   potassium chloride (K-DUR) 10 MEQ tablet, Take 1 tablet (10 mEq total) by mouth daily as needed (take with Lasix).   solifenacin (VESICARE) 5 MG tablet, Take 1 tablet (5 mg total) by mouth daily.   Reviewed prior external information including notes and imaging from  primary care provider As well as notes that were available from care everywhere and other healthcare systems.  Past medical history, social, surgical and family history all reviewed in electronic medical record.  No pertanent information unless stated regarding to the chief complaint.   Review of Systems:  No headache, visual changes, nausea, vomiting, diarrhea, constipation, dizziness, abdominal pain, skin rash, fevers, chills, night sweats, weight loss, swollen lymph nodes, body aches, joint swelling, chest pain, shortness of breath, mood changes. POSITIVE muscle aches  Objective  Blood pressure 124/60, pulse 81, height 5' 9.5" (1.765 m), weight 219 lb (99.3 kg), SpO2 97 %.   General: No apparent distress alert and oriented x3 mood and affect  normal, dressed appropriately.  HEENT: Pupils equal, extraocular movements intact  Respiratory: Patient's speak in full sentences and does not appear short of breath  Cardiovascular: No lower extremity edema, non tender, no erythema  Gait normal with good balance and coordination.  MSK: Patient low back has significant loss of lordosis.  Only 5 degrees of extension of the back noted today.  Patient has tightness with FABER test bilaterally.  Negative straight leg test.  Neurovascularly intact distally.  4+ out of 5 strength but symmetric to the lower extremities.  Right hand exam shows severe CMC arthritic changes with effusion noted.  Positive grind test noted.  Good grip strength though still noted. Limited muscular skeletal ultrasound was performed and interpreted by Antoine Primas, M  Limited ultrasound of the [facility] joint of the right hand shows the patient does have a large effusion noted with possible loose body.  In addition to this patient does have a very large bone spur. Impression: CMC arthritis with effusion  [id number]; 15 additional minutes spent for Therapeutic exercises as stated in above notes.  This included exercises focusing on stretching, strengthening, with significant focus on eccentric aspects.   Long term goals include an improvement in range of motion, strength, endurance as well as avoiding reinjury. Patient's frequency would include in 1-2 times a day, 3-5 times a week for a duration of 6-12 weeks.  Low back exercises that included:  Pelvic tilt/bracing instruction to focus on control of the pelvic girdle and lower abdominal muscles  Glute strengthening exercises, focusing on proper firing of the glutes without engaging the low back muscles Proper stretching techniques for maximum relief for the hamstrings, hip flexors, low back and some rotation where tolerated Proper technique shown and discussed handout in great detail with ATC.  All questions were discussed and answered.        Impression and Recommendations:     The above documentation has been reviewed and is accurate and complete Judi Saa, DO

## 2022-01-15 ENCOUNTER — Other Ambulatory Visit: Payer: Self-pay | Admitting: Cardiovascular Disease

## 2022-01-17 ENCOUNTER — Other Ambulatory Visit: Payer: Self-pay | Admitting: Internal Medicine

## 2022-01-17 ENCOUNTER — Other Ambulatory Visit: Payer: Self-pay

## 2022-01-17 ENCOUNTER — Ambulatory Visit (INDEPENDENT_AMBULATORY_CARE_PROVIDER_SITE_OTHER): Payer: Medicare Other

## 2022-01-17 ENCOUNTER — Ambulatory Visit: Payer: Medicare Other | Attending: Internal Medicine | Admitting: Audiology

## 2022-01-17 ENCOUNTER — Ambulatory Visit (INDEPENDENT_AMBULATORY_CARE_PROVIDER_SITE_OTHER): Payer: Medicare Other | Admitting: Family Medicine

## 2022-01-17 ENCOUNTER — Ambulatory Visit: Payer: Self-pay

## 2022-01-17 VITALS — BP 124/60 | HR 81 | Ht 69.5 in | Wt 219.0 lb

## 2022-01-17 DIAGNOSIS — H903 Sensorineural hearing loss, bilateral: Secondary | ICD-10-CM | POA: Insufficient documentation

## 2022-01-17 DIAGNOSIS — G8929 Other chronic pain: Secondary | ICD-10-CM | POA: Diagnosis not present

## 2022-01-17 DIAGNOSIS — M545 Low back pain, unspecified: Secondary | ICD-10-CM | POA: Diagnosis not present

## 2022-01-17 DIAGNOSIS — M1811 Unilateral primary osteoarthritis of first carpometacarpal joint, right hand: Secondary | ICD-10-CM | POA: Diagnosis not present

## 2022-01-17 DIAGNOSIS — M79644 Pain in right finger(s): Secondary | ICD-10-CM | POA: Diagnosis not present

## 2022-01-17 DIAGNOSIS — M180 Bilateral primary osteoarthritis of first carpometacarpal joints: Secondary | ICD-10-CM | POA: Insufficient documentation

## 2022-01-17 MED ORDER — GABAPENTIN 100 MG PO CAPS: 200.0000 mg | ORAL_CAPSULE | Freq: Every day | ORAL | 0 refills | Status: DC

## 2022-01-17 NOTE — Assessment & Plan Note (Signed)
Arthritis noted.  Discussed icing regimen and home exercises.  Patient will do a brace.  I think that this will be most beneficial.  Worsening pain we did discuss the potential for different types of injections.  Patient wants to try the conservative therapy and see how patient responds. ?

## 2022-01-17 NOTE — Patient Instructions (Addendum)
Xray today ?Thumb spica at night ?Heat before and ice afterwards ?Tart cherry extract '1200mg'$   at night ?Exercises ?Gabapentin '200mg'$  at night ?See me in 5-6 weeks ?

## 2022-01-17 NOTE — Assessment & Plan Note (Signed)
Concern the patient does have some underlying spinal stenosis that could be contributing.  No significant neurogenic claudication at the moment patient would like to try home exercises first.  We discussed the potential for formal physical therapy.  Patient wants to hold at this time.  We will get x-rays to further evaluate.  Start gabapentin again.  Do think that this will be beneficial.  Follow-up with me again in 6 weeks ?

## 2022-01-17 NOTE — Procedures (Signed)
?  [facility] ?[address] ?[place], [place]  [address] ?[phone] ? ?AUDIOLOGICAL  EVALUATION ? 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 ?History: ?[name] was seen for an audiological evaluation due to concerns with decreased hearing. [name] stated she has noticed a gradual decrease in her hearing over the last few years. She stated she has trouble hearing her husband especially when in background noise. She reported she feels that her left ear is worse than the right. She denied any pain, pressure or fullness at today's appointment. [name] denied tinnitus today as well. She does not report a history of noise exposure. [name] stated she does have a family history of hearing loss. No other case history was gathered ? ? ?Evaluation:  ?Otoscopy showed a clear view of the tympanic membranes, bilaterally ?Tympanometry results were consistent with normal middle ear mobility, bilaterally ?Audiometric testing was completed using Conventional Audiometry techniques with insert earphones and high- frequency headphones. Test results are consistent with normal sloping to a mild high frequency hearing loss in the right ear and normal sloping to a moderate high-frequency hearing loss in the left ear. Some asymmetry was noted between the right and left ears with the left ear being worse from 3,000-8,000 Hz. Speech Recognition Thresholds were obtained at 30 dB HL in the right ear and at 25  dB HL in the left ear. Word Recognition Testing was completed at 70 dB HL with masking and Wh[place] scored 96% in the right ear and completed at 65 dB HL with masking and [place] scored 96% in the left ear. Based on hearing sensitivity [name] is not a candidate for  amplification at this time.   ? ? ?Results:  ?The test results were reviewed with [name] and that she  has a mild high frequency hearing loss in the right ear and a moderate high-frequency hearing loss in the left ear. [name] is not a candidate for amplification at this time. Due to the asymmetry between the ears it is recommended that [name] follow up with an Ear, Nose, and Throat physician. It is recommended that [name] have a reevaluation in a year to monitor hearing sensitivity. [name] was given a copy of her hearing results as well as handouts on good communication strategies to take with her at today's appointment.  ? ? ?Recommendations: ?1.   Follow up with ENT due to asymmetry between the right and left ears ?2.  Revaluation in a year to monitor hearing sensitivity  ? ?  ?If you have any questions please feel free to contact me at [phone]. ? ?Lorenza Evangelist, [place].  ?Audiology Intern ? ?[name] ?Audiologist, Au.D., CCC-A ?01/17/2022  9:09 AM ? ?Cc: Hoyt Koch, MD  ?

## 2022-01-18 ENCOUNTER — Encounter: Payer: Self-pay | Admitting: Cardiovascular Disease

## 2022-01-18 ENCOUNTER — Ambulatory Visit (INDEPENDENT_AMBULATORY_CARE_PROVIDER_SITE_OTHER): Payer: Medicare Other | Admitting: Cardiovascular Disease

## 2022-01-18 VITALS — BP 126/78 | HR 70 | Ht 69.5 in | Wt 219.0 lb

## 2022-01-18 DIAGNOSIS — I483 Typical atrial flutter: Secondary | ICD-10-CM | POA: Diagnosis not present

## 2022-01-18 DIAGNOSIS — I2510 Atherosclerotic heart disease of native coronary artery without angina pectoris: Secondary | ICD-10-CM

## 2022-01-18 DIAGNOSIS — E1169 Type 2 diabetes mellitus with other specified complication: Secondary | ICD-10-CM | POA: Diagnosis not present

## 2022-01-18 DIAGNOSIS — E785 Hyperlipidemia, unspecified: Secondary | ICD-10-CM | POA: Diagnosis not present

## 2022-01-18 NOTE — Progress Notes (Signed)
? ? ?[name] ?Date of Birth  Mar 19, 1946 ?[facility]         ? ?[address]     ?[place], [place]  [address]       ?  ? ? ?Problems. ?1. CAD ?2. Atrial Flutter - s/p Aflutter ablation Nov. 2014.  ?3. Diabetes Mellitus. ? ? ? ?[name] is doing very well. She's not having episodes of chest pain or shortness of breath. She's been able to below of her normal activities without any significant problems. She has not had chest pains .  Her insurance will run out the month before she goes on Medicare.   She has not had any CP and no palpitations.   ? ?April 08, 2013: ? ? [name] is doing well. No CP.  Rhythm is stable.    ? ?Mar 11, 2014: ? ?[name] is doing well.  She had a atrial flutter ablation several months ago.  She has not been taking her metoprolol.. ? ?Nov. 9, 2015: ? ?[name] is doing well. ?No Cp , no dyspnea. ? ?Active, not exerciseing as much as she would like. ? ?Mar 16, 2015:  ? ?[name] is doing well.   Doing well from a cardiac standpoint.  ? ?Nov. 10, 2016 ? ?Lots of stress - her 7 yo mom is living with her.  Will not let anyone else do anything ?Lots of stress ?No CP , no arrhythmias ?No time to exercise ? ? ?Mar 20, 2016: ? ?Doing well ?Her mother is living back in [place].   ?Stress is a bit less. ? ?Exercising some .  ? ?Nov. 22, 2017. ? ?Doing well . ?Had afib ablation several years ago and feels great.  ?DM is well controlled.  ?Has UTIs frequently .  ? ?November 17, 2016: ?Seen back today for follow-up of her coronary artery disease. ?Doing well ?Falls occasionally ,  Broke her left kneecap and left ankle this past year.  ?Takes the niacin at night and the ASA 81 mg does not conrol the niacin flushng like the 325 mg niacin does.  ? ?Jan. 14, 2020 ? ?[name] is seen today . ?No CP or dyspnea  ?Exercising some.   ?Diabetes is well controlled.  ? ?Jan. 18, 2021 ? ?[name] is seen toda for follow up of her  CAD, atrial flutter, DM, hyperlipidemia ?Doing well.    ?VS look  great ?Exercising regularly .  ?No CP or dyspnea.  ?Is on ASA 325 for the effects of niacin at bedtime  ? ?May 22, 2020 ?   ?[name] is seen today for follow up of her CAD, atrial flutter, DM, HLD ?Her daughter and family have moved back to [place]  ?No cp, no dyspnea,   ?No palpitations suggestive of atrial flutter.  ? ?Jan. 24, 2022: ? ?[name] is seen today for follow up of her CAD, atrial flutter, DM, HLD ?Doing well.  ?No cardiac issues  ?Diabetes is doing well. Has a new insulin pump  ?HbA1C looks good  ? ?January 18, 2022: ?[name] is seen today for follow-up of her coronary artery disease, atrial flutter, diabetes, hyperlipidemia. ? ?No CP , no dyspnea  ?Is not getting enough exercise  ?Needs to do more cardio ? ?Current Outpatient Medications on File Prior to Visit  ?Medication Sig Dispense Refill  ? ASPIRIN 81 PO Take by mouth.    ? Calcium Carbonate-Vitamin D (CALCIUM + D PO) Take 600 mg by mouth 2 (two)  times daily.    ? cholecalciferol (VITAMIN D) 1000 units tablet Take 1,000 Units by mouth 2 (two) times daily.     ? clopidogrel (PLAVIX) 75 MG tablet Take 1 tablet (75 mg total) by mouth daily. Please keep upcoming appt in March 2023 withy Dr. Acie Fredrickson before anymore refills. Thank you Final Attempt 90 tablet 0  ? Continuous Blood Gluc Sensor (DEXCOM G6 SENSOR) MISC by Does not apply route.    ? ezetimibe (ZETIA) 10 MG tablet TAKE 1 TABLET DAILY 90 tablet 3  ? furosemide (LASIX) 20 MG tablet Take 1 tablet (20 mg total) by mouth as needed. Take 1 tablet by mouth as needed for 5 days. 30 tablet 11  ? gabapentin (NEURONTIN) 100 MG capsule Take 2 capsules (200 mg total) by mouth at bedtime. 180 capsule 0  ? ibuprofen (ADVIL) 200 MG tablet Take 200 mg by mouth every 6 (six) hours as needed.    ? Insulin Lispro-aabc (LYUMJEV) 100 UNIT/ML SOLN USE A MAXIMUM OF 90 UNITS DAILY VIA INSULIN PUMP 80 mL 3  ? losartan (COZAAR) 25 MG tablet Take 1 tablet (25 mg total) by mouth daily. 90 tablet 2  ? metFORMIN (GLUCOPHAGE-XR)  500 MG 24 hr tablet TAKE 1 TABLET IN THE MORNING AND 2 TABLETS IN THE EVENING AS DIRECTED 270 tablet 3  ? metoprolol succinate (TOPROL-XL) 100 MG 24 hr tablet TAKE 1 TABLET DAILY. 90 tablet 0  ? Multiple Vitamins-Minerals (ICAPS AREDS 2 PO) Take 2 tablets by mouth daily.    ? nitroGLYCERIN (NITROSTAT) 0.4 MG SL tablet DISSOLVE 1 TABLET UNDER THE TONGUE EVERY 5 MINUTES AS NEEDED FOR CHEST PAIN 75 tablet 0  ? pioglitazone (ACTOS) 30 MG tablet Take 1 tablet (30 mg total) by mouth daily. 90 tablet 3  ? potassium chloride (K-DUR) 10 MEQ tablet Take 1 tablet (10 mEq total) by mouth daily as needed (take with Lasix). 90 tablet 3  ? rosuvastatin (CRESTOR) 40 MG tablet Take 1 tablet (40 mg total) by mouth daily. 90 tablet 3  ? solifenacin (VESICARE) 5 MG tablet Take 1 tablet (5 mg total) by mouth daily. 90 tablet 1  ? benzonatate (TESSALON PERLES) 100 MG capsule 1-2 tablets twice daily as needed for cough (Patient not taking: Reported on 01/18/2022) 30 capsule 0  ? ?No current facility-administered medications on file prior to visit.  ? ? ?Allergies  ?Allergen Reactions  ? Sulfa Antibiotics Rash  ? Sulfamethoxazole-Trimethoprim Rash  ? ? ?Past Medical History:  ?Diagnosis Date  ? Arthritis   ? OA  ? Atrial flutter ([place])   ? a. s/p TEE/DCCV 08/16/13 (normal LV function, no LAA thrombus).b. s/p ablation by Dr Lovena Le 09-23-2013  ? Coronary artery disease   ? 2009 LAD Promus stent  ? Dysrhythmia   ? Aflutter, no issues after ablation 2014  ? Fibroid   ? Hyperlipidemia   ? Insulin pump in place   ? Osteopenia   ? Type 1 diabetes mellitus on insulin therapy ([place])   ? Valvular heart disease   ? a. Mild MR/TR by TEE 08/2013.  ? ? ?Past Surgical History:  ?Procedure Laterality Date  ? ABLATION  09-23-2013  ? CTI by Dr Lovena Le  ? APPENDECTOMY    ? APPLICATION OF WOUND VAC Left 04/11/2017  ? Procedure: APPLICATION OF WOUND VAC LEFT KNEE;  Surgeon: Rod Can, MD;  Location: [facility];  Service: Orthopedics;  Laterality: Left;  ? ATRIAL  FLUTTER ABLATION N/A 09/23/2013  ? Procedure: ATRIAL FLUTTER ABLATION;  Surgeon: Evans Lance, MD;  Location: [facility] CATH LAB;  Service: Cardiovascular;  Laterality: N/A;  ? CARDIOVERSION N/A 08/16/2013  ? Procedure: CARDIOVERSION;  Surgeon: Thayer Headings, MD;  Location: [facility];  Service: Cardiovascular;  Laterality: N/A;  spoke with [name]  ? CATARACT EXTRACTION Bilateral   ? COLONOSCOPY    ? CORONARY ANGIOPLASTY WITH STENT PLACEMENT    ? MYOMECTOMY  1977  ? ORIF PATELLA Left 04/11/2017  ? ORIF PATELLA Left 04/11/2017  ? Procedure: OPEN REDUCTION INTERNAL (ORIF) FIXATION PATELLA LEFT KNEE;  Surgeon: Rod Can, MD;  Location: [facility];  Service: Orthopedics;  Laterality: Left;  ? PELVIC LAPAROSCOPY    ? TEE WITHOUT CARDIOVERSION N/A 08/16/2013  ? Procedure: TRANSESOPHAGEAL ECHOCARDIOGRAM (TEE);  Surgeon: Thayer Headings, MD;  Location: [facility];  Service: Cardiovascular;  Laterality: N/A;  ? ? ?Social History  ? ?Tobacco Use  ?Smoking Status Never  ?Smokeless Tobacco Never  ? ? ?Social History  ? ?Substance and Sexual Activity  ?Alcohol Use Yes  ? Alcohol/week: 5.0 standard drinks  ? Types: 5 Glasses of wine per week  ? Comment: on weekends  ? ? ?Family History  ?Problem Relation Age of Onset  ? CAD Father 69  ?     died age 2  ? CAD Brother 62  ?     small vessel disease  ? Atrial fibrillation Mother   ? Diabetes Mother   ? Hypertension Mother   ? CAD Cousin   ?     paternal  ? CAD Paternal Grandfather   ?     early onset  ? Colon cancer Neg Hx   ? ? ?Reviw of Systems:  ?Reviewed in the HPI.  All other systems are negative. ? ?Physical Exam: ?Blood pressure 126/78, pulse 70, height 5' 9.5" (1.765 m), weight 219 lb (99.3 kg), SpO2 99 %. ? ?GEN:  Well nourished, well developed in no acute distress ?HEENT: Normal ?NECK: No JVD; No carotid bruits ?LYMPHATICS: No lymphadenopathy ?CARDIAC: RRR , no murmurs, rubs, gallops ?RESPIRATORY:  Clear to auscultation without rales, wheezing or rhonchi  ?ABDOMEN: Soft,  non-tender, non-distended ?MUSCULOSKELETAL:  No edema; No deformity  ?SKIN: Warm and dry ?NEUROLOGIC:  Alert and oriented x 3 ? ? ?ECG: January 18, 2022: Normal sinus rhythm at 70.  No ST or T wave changes. ? ?Assessment

## 2022-01-18 NOTE — Patient Instructions (Signed)
Medication Instructions:  ?Your physician recommends that you continue on your current medications as directed. Please refer to the Current Medication list given to you today. ? ?*If you need a refill on your cardiac medications before your next appointment, please call your pharmacy* ? ?Lab Work: ?NONE ?If you have labs (blood work) drawn today and your tests are completely normal, you will receive your results only by: ?MyChart Message (if you have MyChart) OR ?A paper copy in the mail ?If you have any lab test that is abnormal or we need to change your treatment, we will call you to review the results. ? ?Testing/Procedures: ?NONE ? ?Follow-Up: ?At [facility], you and your health needs are our priority.  As part of our continuing mission to provide you with exceptional heart care, we have created designated Provider Care Teams.  These Care Teams include your primary Cardiologist (physician) and Advanced Practice Providers (APPs -  Physician Assistants and Nurse Practitioners) who all work together to provide you with the care you need, when you need it. ? ?Your next appointment:   ?1 year(s) ? ?The format for your next appointment:   ?In Person ? ?Provider:   ?Philip [name], MD  or Vin Bhagat, PA-C or Scott Weaver, PA-C  ?

## 2022-01-20 ENCOUNTER — Encounter: Payer: Self-pay | Admitting: Cardiovascular Disease

## 2022-01-22 ENCOUNTER — Other Ambulatory Visit: Payer: Self-pay | Admitting: Cardiovascular Disease

## 2022-02-21 NOTE — Progress Notes (Signed)
Can you remind ?Charlann Boxer D.O. ?[facility] ?[place] ?Phone: [phone] ?Subjective:   ?I, [name], am serving as a Education administrator for Dr. Hulan Saas. ?This visit occurred during the SARS-CoV-2 public health emergency.  Safety protocols were in place, including screening questions prior to the visit, additional usage of staff PPE, and extensive cleaning of exam room while observing appropriate contact time as indicated for disinfecting solutions.  ? ?I'm seeing this patient by the request  of:  Hoyt Koch, MD ? ?CC: Low back pain follow-up ? ?AST:[id number]  ?01/17/2022 ?Arthritis noted.  Discussed icing regimen and home exercises.  Patient will do a brace.  I think that this will be most beneficial.  Worsening pain we did discuss the potential for different types of injections.  Patient wants to try the conservative therapy and see how patient responds. ? ?Concern the patient does have some underlying spinal stenosis that could be contributing.  No significant neurogenic claudication at the moment patient would like to try home exercises first.  We discussed the potential for formal physical therapy.  Patient wants to hold at this time.  We will get x-rays to further evaluate.  Start gabapentin again.  Do think that this will be beneficial.  Follow-up with me again in 6 weeks ? ?Update 02/22/2022 ?[name] is a 76 y.o. female coming in with complaint of R thumb and low back pain. Patient states thumb is doing better. LBP is about the same.  Patient has been noncompliant though with the exercises at this moment.  Patient still having pain even with walking her dog.  Is having to take still ibuprofen more days of the week.  Patient is on Plavix but has not noticed any significant bleeding. ? ? ? ?  ? ?Past Medical History:  ?Diagnosis Date  ? Arthritis   ? OA  ? Atrial flutter ([place])   ? a. s/p TEE/DCCV 08/16/13 (normal LV function, no LAA thrombus).b. s/p  ablation by Dr Lovena Le 09-23-2013  ? Coronary artery disease   ? 2009 LAD Promus stent  ? Dysrhythmia   ? Aflutter, no issues after ablation 2014  ? Fibroid   ? Hyperlipidemia   ? Insulin pump in place   ? Osteopenia   ? Type 1 diabetes mellitus on insulin therapy ([place])   ? Valvular heart disease   ? a. Mild MR/TR by TEE 08/2013.  ? ?Past Surgical History:  ?Procedure Laterality Date  ? ABLATION  09-23-2013  ? CTI by Dr Lovena Le  ? APPENDECTOMY    ? APPLICATION OF WOUND VAC Left 04/11/2017  ? Procedure: APPLICATION OF WOUND VAC LEFT KNEE;  Surgeon: Rod Can, MD;  Location: [place];  Service: Orthopedics;  Laterality: Left;  ? ATRIAL FLUTTER ABLATION N/A 09/23/2013  ? Procedure: ATRIAL FLUTTER ABLATION;  Surgeon: Evans Lance, MD;  Location: [facility] CATH LAB;  Service: Cardiovascular;  Laterality: N/A;  ? CARDIOVERSION N/A 08/16/2013  ? Procedure: CARDIOVERSION;  Surgeon: Thayer Headings, MD;  Location: [place];  Service: Cardiovascular;  Laterality: N/A;  spoke with [name]  ? CATARACT EXTRACTION Bilateral   ? COLONOSCOPY    ? CORONARY ANGIOPLASTY WITH STENT PLACEMENT    ? MYOMECTOMY  1977  ? ORIF PATELLA Left 04/11/2017  ? ORIF PATELLA Left 04/11/2017  ? Procedure: OPEN REDUCTION INTERNAL (ORIF) FIXATION PATELLA LEFT KNEE;  Surgeon: Rod Can, MD;  Location: [place];  Service: Orthopedics;  Laterality: Left;  ? PELVIC  LAPAROSCOPY    ? TEE WITHOUT CARDIOVERSION N/A 08/16/2013  ? Procedure: TRANSESOPHAGEAL ECHOCARDIOGRAM (TEE);  Surgeon: Thayer Headings, MD;  Location: [place];  Service: Cardiovascular;  Laterality: N/A;  ? ?Social History  ? ?Socioeconomic History  ? Marital status: Married  ?  Spouse name: Not on file  ? Number of children: 2  ? Years of education: Not on file  ? Highest education level: Not on file  ?Occupational History  ? Occupation: home maker  ?  Comment: Children adopted  ?Tobacco Use  ? Smoking status: Never  ? Smokeless tobacco: Never  ?Vaping Use  ? Vaping Use: Never used  ?Substance  and Sexual Activity  ? Alcohol use: Yes  ?  Alcohol/week: 5.0 standard drinks  ?  Types: 5 Glasses of wine per week  ?  Comment: on weekends  ? Drug use: No  ? Sexual activity: Yes  ?  Birth control/protection: Post-menopausal  ?Other Topics Concern  ? Not on file  ?Social History Narrative  ? Not on file  ? ?Social Determinants of Health  ? ?Financial Resource Strain: Low Risk   ? Difficulty of Paying Living Expenses: Not hard at all  ?Food Insecurity: Not on file  ?Transportation Needs: Not on file  ?Physical Activity: Not on file  ?Stress: Not on file  ?Social Connections: Socially Integrated  ? Frequency of Communication with Friends and Family: More than three times a week  ? Frequency of Social Gatherings with Friends and Family: Twice a week  ? Attends Religious Services: More than 4 times per year  ? Active Member of Clubs or Organizations: Yes  ? Attends Archivist Meetings: More than 4 times per year  ? Marital Status: Married  ? ?Allergies  ?Allergen Reactions  ? Sulfa Antibiotics Rash  ? Sulfamethoxazole-Trimethoprim Rash  ? ?Family History  ?Problem Relation Age of Onset  ? CAD Father 34  ?     died age 66  ? CAD Brother 53  ?     small vessel disease  ? Atrial fibrillation Mother   ? Diabetes Mother   ? Hypertension Mother   ? CAD Cousin   ?     paternal  ? CAD Paternal Grandfather   ?     early onset  ? Colon cancer Neg Hx   ? ? ?Current Outpatient Medications (Endocrine & Metabolic):  ?  Insulin Lispro-aabc (LYUMJEV) 100 UNIT/ML SOLN, USE A MAXIMUM OF 90 UNITS DAILY VIA INSULIN PUMP ?  metFORMIN (GLUCOPHAGE-XR) 500 MG 24 hr tablet, TAKE 1 TABLET IN THE MORNING AND 2 TABLETS IN THE EVENING AS DIRECTED ?  pioglitazone (ACTOS) 30 MG tablet, Take 1 tablet (30 mg total) by mouth daily. ? ?Current Outpatient Medications (Cardiovascular):  ?  ezetimibe (ZETIA) 10 MG tablet, TAKE 1 TABLET DAILY ?  furosemide (LASIX) 20 MG tablet, Take 1 tablet (20 mg total) by mouth as needed. Take 1 tablet by  mouth as needed for 5 days. ?  losartan (COZAAR) 25 MG tablet, Take 1 tablet (25 mg total) by mouth daily. ?  metoprolol succinate (TOPROL-XL) 100 MG 24 hr tablet, TAKE 1 TABLET DAILY. ?  nitroGLYCERIN (NITROSTAT) 0.4 MG SL tablet, DISSOLVE 1 TABLET UNDER THE TONGUE EVERY 5 MINUTES AS NEEDED FOR CHEST PAIN ?  rosuvastatin (CRESTOR) 40 MG tablet, Take 1 tablet (40 mg total) by mouth daily. ? ?Current Outpatient Medications (Respiratory):  ?  benzonatate (TESSALON PERLES) 100 MG capsule, 1-2 tablets twice daily as needed  for cough (Patient not taking: Reported on 01/18/2022) ? ?Current Outpatient Medications (Analgesics):  ?  meloxicam (MOBIC) 7.5 MG tablet, Take 1 tablet (7.5 mg total) by mouth daily. ?  ASPIRIN 81 PO, Take by mouth. ?  ibuprofen (ADVIL) 200 MG tablet, Take 200 mg by mouth every 6 (six) hours as needed. ? ?Current Outpatient Medications (Hematological):  ?  clopidogrel (PLAVIX) 75 MG tablet, Take 1 tablet (75 mg total) by mouth daily. Please keep upcoming appt in March 2023 withy Dr. Acie Fredrickson before anymore refills. Thank you Final Attempt ? ?Current Outpatient Medications (Other):  ?  Calcium Carbonate-Vitamin D (CALCIUM + D PO), Take 600 mg by mouth 2 (two) times daily. ?  cholecalciferol (VITAMIN D) 1000 units tablet, Take 1,000 Units by mouth 2 (two) times daily.  ?  Continuous Blood Gluc Sensor (DEXCOM G6 SENSOR) MISC, by Does not apply route. ?  gabapentin (NEURONTIN) 100 MG capsule, Take 2 capsules (200 mg total) by mouth at bedtime. ?  Multiple Vitamins-Minerals (ICAPS AREDS 2 PO), Take 2 tablets by mouth daily. ?  potassium chloride (K-DUR) 10 MEQ tablet, Take 1 tablet (10 mEq total) by mouth daily as needed (take with Lasix). ?  solifenacin (VESICARE) 5 MG tablet, Take 1 tablet (5 mg total) by mouth daily. ? ? ?Reviewed prior external information including notes and imaging from  ?primary care provider ?As well as notes that were available from care everywhere and other healthcare  systems. ? ?Past medical history, social, surgical and family history all reviewed in electronic medical record.  No pertanent information unless stated regarding to the chief complaint.  ? ?Review of Systems: ? No hea

## 2022-02-22 ENCOUNTER — Ambulatory Visit (INDEPENDENT_AMBULATORY_CARE_PROVIDER_SITE_OTHER): Payer: Medicare Other | Admitting: Family Medicine

## 2022-02-22 DIAGNOSIS — I2510 Atherosclerotic heart disease of native coronary artery without angina pectoris: Secondary | ICD-10-CM | POA: Diagnosis not present

## 2022-02-22 DIAGNOSIS — M545 Low back pain, unspecified: Secondary | ICD-10-CM | POA: Diagnosis not present

## 2022-02-22 DIAGNOSIS — G8929 Other chronic pain: Secondary | ICD-10-CM | POA: Diagnosis not present

## 2022-02-22 MED ORDER — MELOXICAM 7.5 MG PO TABS: 7.5000 mg | ORAL_TABLET | Freq: Every day | ORAL | 0 refills | Status: DC

## 2022-02-22 NOTE — Patient Instructions (Signed)
Meloxicam 7.'5mg'$  daily for 10 days then as needed in 3 day burst ?Don't take any other anti inflammatories while on Meloxicam ?Stop if hurts stomach ?Please try and do exercises ?[place] recovery sandals while inside or walking dog ?[place] or [place] shoes maybe good for you ?See you again in 6 weeks ?

## 2022-02-22 NOTE — Assessment & Plan Note (Signed)
Chronic arthritic changes.  Discussed potential meloxicam. ?Patient does take the Plavix and will do a very low dose at 7.5.  Encouraged her to discontinue the ibuprofen that she was using more frequently.  Patient will watch for any type of bruising.  We discussed which activities to do and which ones to avoid.  Patient still wants to hold on any type of formal physical therapy.  We will continue the gabapentin at night.  Worsening pain will consider the MRI. ?

## 2022-02-27 ENCOUNTER — Encounter: Payer: Self-pay | Admitting: Internal Medicine

## 2022-02-28 ENCOUNTER — Ambulatory Visit: Payer: Medicare Other

## 2022-02-28 DIAGNOSIS — I10 Essential (primary) hypertension: Secondary | ICD-10-CM

## 2022-02-28 DIAGNOSIS — E108 Type 1 diabetes mellitus with unspecified complications: Secondary | ICD-10-CM

## 2022-02-28 DIAGNOSIS — I2510 Atherosclerotic heart disease of native coronary artery without angina pectoris: Secondary | ICD-10-CM

## 2022-02-28 NOTE — Patient Instructions (Signed)
Visit Information ? ?Following are the goals we discussed today:  ? ?Manage My Medicine  ? ?Timeframe:  Long-Range Goal ?Priority:  Medium ?Start Date:       03/02/21                      ?Expected End Date:   03/01/2023                 ? ?Follow Up Date 08/2022 ?  ?- call for medicine refill 2 or 3 days before it runs out ?- call if I am sick and can't take my medicine ?- keep a list of all the medicines I take; vitamins and herbals too ?- use a pillbox to sort medicine  ?  ?Why is this important?   ?These steps will help you keep on track with your medicines. ? ?Plan: Telephone follow up appointment with care management team member scheduled for:  6 months ?The patient has been provided with contact information for the care management team and has been advised to call with any health related questions or concerns.  ? ?Tomasa Blase, PharmD ?Clinical Pharmacist, [place]  ? ?Please call the care guide team at [phone] if you need to cancel or reschedule your appointment.  ? ?Patient verbalizes understanding of instructions and care plan provided today and agrees to view in [place]. Active MyChart status confirmed with patient.   ? ?

## 2022-02-28 NOTE — Progress Notes (Signed)
? ?Chronic Care Management ?Pharmacy Note ? ?02/28/2022 ?Name:  [name] MRN:  [id number] DOB:  July 29, 1946 ? ?Summary: ?-Patient endorses compliance with current medications, denies any issues or concerns  ?-BG well controlled - has follow up with Dr. [name] next month  ?-Pain has improved since starting meloxicam and gabapentin started by Dr. Tamala Julian ?-Denies any issues since starting vesicare, feels OAB has improved ?-BP at home, can be someone elevated in the 140-150/70-80's - but in office has been well controlled - possible home BP cuff is not accurate ? ?Recommendations/Changes made from today's visit: ?-Recommending no medication changes at this time, advised for patient to avoid use of IBU now that she has meloxicam prescription - will now be taking in 3 days bursts if needed  ?-Patient to reach out to cardiology should BP averages be >140/90 ?-F/u in 6 months  ? ? ?Subjective: ?[name] is an 76 y.o. year old female who is a primary patient of Hoyt Koch, MD.  The CCM team was consulted for assistance with disease management and care coordination needs.   ? ?Engaged with patient by telephone for follow up visit in response to provider referral for pharmacy case management and/or care coordination services.  ? ?Consent to Services:  ?The patient was given information about Chronic Care Management services, agreed to services, and gave verbal consent prior to initiation of services.  Please see initial visit note for detailed documentation.  ? ?Patient Care Team: ?Hoyt Koch, MD as PCP - General (Internal Medicine) ?[name], Wonda Cheng, MD as PCP - Cardiology (Cardiology) ?Elayne Snare, MD (Endocrinology) ?[name], Wonda Cheng, MD (Cardiology) ?Lindwood Coke, MD (Dermatology) ?Evans Lance, MD (Cardiology) ?Lafayette Dragon, MD (Inactive) (Gastroenterology) ?Lyndal Pulley, DO (Sports Medicine) ?[name], Heather, [place] as Consulting Physician (Optometry) ?Tomasa Blase, [facility]  (Pharmacist) ? ? ?Pt lives with husband of 64 years, have lived in [place] the whole time. She has 45-year old granddaughter who just recently moved back to [facility] from [place] and they visit often.  ? ?They have a dog. They have season tickets to the [facility]. They have a beach house in [place].  ? ?Recent office visits: ?01/08/2022 - Dr. [name] - vesicare rx'd for urinary frequency - pt to discuss rybelsus with endocrinology  ?  ?Recent consult visits: ?02/22/2022 - Dr. Tamala Julian - sports Medicine - chronic low back pain - meloxicam rx'd - f/u in 6 weeks  ?01/18/2022 - Dr. Cathie Olden - Cardiology - no changes to medications - f/u in 1 year  ?01/17/2022 - Dr. Tamala Julian - Sports Medicine - r thumb and back pain - start gabapentin - follow up in 6 weeks  ?11/12/2021 - Dr. [name] - endocrinology - no changes to medications - f/u in 4 months    ? ?Hospital visits: ?11/02/2021 - ED visit - abcess  ? ?Objective: ? ?Lab Results  ?Component Value Date  ? CREATININE 0.79 10/11/2021  ? BUN 16 10/11/2021  ? GFR 74.00 10/11/2021  ? GFRNONAA 69 11/17/2018  ? GFRAA 79 11/17/2018  ? NA 139 10/11/2021  ? K 4.9 10/11/2021  ? CALCIUM 9.5 10/11/2021  ? CO2 30 10/11/2021  ? GLUCOSE 115 (H) 10/11/2021  ? ? ?Lab Results  ?Component Value Date/Time  ? HGBA1C 6.8 (H) 10/11/2021 09:06 AM  ? HGBA1C 7.2 (H) 06/15/2021 09:00 AM  ? FRUCTOSAMINE 270 10/06/2019 08:18 AM  ? FRUCTOSAMINE 292 (H) 02/04/2017 11:30 AM  ? GFR 74.00 10/11/2021 09:06 AM  ? GFR 61.77  06/15/2021 09:00 AM  ? MICROALBUR <0.7 06/15/2021 09:00 AM  ? MICROALBUR <0.7 04/18/2020 09:06 AM  ?  ?Last diabetic Eye exam:  ?Lab Results  ?Component Value Date/Time  ? HMDIABEYEEXA No Retinopathy 10/10/2021 03:07 PM  ?  ?Last diabetic Foot exam: No results found for: HMDIABFOOTEX  ? ?Lab Results  ?Component Value Date  ? CHOL 104 10/11/2021  ? HDL 46.50 10/11/2021  ? LDLCALC 43 10/11/2021  ? LDLDIRECT 79.0 01/11/2015  ? TRIG 76.0 10/11/2021  ? CHOLHDL 2 10/11/2021  ? ? ? ?  Latest Ref Rng & Units  10/11/2021  ?  9:06 AM 02/28/2021  ?  9:31 AM 07/19/2020  ?  9:39 AM  ?Hepatic Function  ?Total Protein 6.0 - 8.3 g/dL 6.0   7.0   6.5    ?Albumin 3.5 - 5.2 g/dL 3.6   4.1   3.8    ?AST 0 - 37 U/L _0 ?ALT 0 - 35 U/L _1 ?Alk Phosphatase 39 - 117 U/L 87   91   83    ?Total Bilirubin 0.2 - 1.2 mg/dL 0.4   0.5   0.6    ? ? ?Lab Results  ?Component Value Date/Time  ? TSH 2.33 10/11/2021 09:06 AM  ? TSH 2.23 10/06/2019 08:18 AM  ? ? ? ?  Latest Ref Rng & Units 11/22/2019  ?  8:49 AM 04/11/2017  ?  6:42 AM 03/27/2017  ? 10:13 AM  ?CBC  ?WBC 3.4 - 10.8 x10E3/uL 6.5   5.5   6.5    ?Hemoglobin 11.1 - 15.9 g/dL 14.4   12.8   13.9    ?Hematocrit 34.0 - 46.6 % 43.9   39.5   41.3    ?Platelets 150 - 450 x10E3/uL 246   321   302    ? ? ?Lab Results  ?Component Value Date/Time  ? VD25OH 55.38 02/14/2017 11:35 AM  ? VD25OH 64 01/13/2014 07:40 AM  ? ? ?Clinical ASCVD: Yes  ?The ASCVD Risk score (Arnett DK, et al., 2019) failed to calculate for the following reasons: ?  The valid total cholesterol range is 130 to 320 mg/dL   ? ? ?  01/08/2022  ? 10:32 AM 05/14/2021  ?  1:09 PM 02/14/2021  ?  9:18 AM  ?Depression screen PHQ 2/9  ?Decreased Interest 0 0 0  ?Down, Depressed, Hopeless 0 0 0  ?PHQ - 2 Score 0 0 0  ?  ? ? ?Social History  ? ?Tobacco Use  ?Smoking Status Never  ?Smokeless Tobacco Never  ? ?BP Readings from Last 3 Encounters:  ?02/22/22 126/74  ?01/18/22 126/78  ?01/17/22 124/60  ? ?Pulse Readings from Last 3 Encounters:  ?02/22/22 75  ?01/18/22 70  ?01/17/22 81  ? ?Wt Readings from Last 3 Encounters:  ?02/22/22 224 lb (101.6 kg)  ?01/18/22 219 lb (99.3 kg)  ?01/17/22 219 lb (99.3 kg)  ? ?BMI Readings from Last 3 Encounters:  ?02/22/22 33.08 kg/m?  ?01/18/22 31.88 kg/m?  ?01/17/22 31.88 kg/m?  ? ? ?Assessment/Interventions: Review of patient past medical history, allergies, medications, health status, including review of consultants reports, laboratory and other test data, was performed as part of comprehensive  evaluation and provision of chronic care management services.  ? ?SDOH:  (Social Determinants of Health) assessments and interventions performed: Yes ? ? ?SDOH Screenings  ? ?Alcohol Screen: Not on file  ?Depression (PHQ2-9):  Low Risk   ? PHQ-2 Score: 0  ?Financial Resource Strain: Low Risk   ? Difficulty of Paying Living Expenses: Not hard at all  ?Food Insecurity: Not on file  ?Housing: Not on file  ?Physical Activity: Not on file  ?Social Connections: Socially Integrated  ? Frequency of Communication with Friends and Family: More than three times a week  ? Frequency of Social Gatherings with Friends and Family: Twice a week  ? Attends Religious Services: More than 4 times per year  ? Active Member of Clubs or Organizations: Yes  ? Attends Archivist Meetings: More than 4 times per year  ? Marital Status: Married  ?Stress: Not on file  ?Tobacco Use: Low Risk   ? Smoking Tobacco Use: Never  ? Smokeless Tobacco Use: Never  ? Passive Exposure: Not on file  ?Transportation Needs: Not on file  ? ? ?[place] ? ?Allergies  ?Allergen Reactions  ? Sulfa Antibiotics Rash  ? Sulfamethoxazole-Trimethoprim Rash  ? ? ?Medications Reviewed Today   ? ? Reviewed by Tomasa Blase, [facility] (Pharmacist) on 02/28/22 at 1637  Med List Status: <None>  ? ?Medication Order Taking? Sig Documenting Provider Last Dose Status Informant  ?ASPIRIN 81 PO [id number] Yes Take by mouth. [provider] Taking Active   ?Calcium Carbonate-Vitamin D (CALCIUM + D PO) [id number] Yes Take 600 mg by mouth 2 (two) times daily. [provider] Taking Active Self  ?cholecalciferol (VITAMIN D) 1000 units tablet [id number] Yes Take 1,000 Units by mouth 2 (two) times daily.  [provider] Taking Active Self  ?clopidogrel (PLAVIX) 75 MG tablet [id number] Yes Take 1 tablet (75 mg total) by mouth daily. Please keep upcoming appt in March 2023 withy Dr. Acie Fredrickson before anymore refills. Thank you Final Attempt [name], Wonda Cheng, MD  Taking Active   ?Continuous Blood Gluc Sensor ([place]) MISC [id number]  by Does not apply route. [provider]  Active   ?ezetimibe (ZETIA) 10 MG tablet [id number] Yes TAKE 1 TABLET DAILY Elayne Snare

## 2022-03-19 ENCOUNTER — Other Ambulatory Visit (INDEPENDENT_AMBULATORY_CARE_PROVIDER_SITE_OTHER): Payer: Medicare Other

## 2022-03-19 DIAGNOSIS — E1065 Type 1 diabetes mellitus with hyperglycemia: Secondary | ICD-10-CM

## 2022-03-19 LAB — GLUCOSE, RANDOM: Glucose, Bld: 94 mg/dL (ref 70–99)

## 2022-03-19 LAB — HEMOGLOBIN A1C: Hgb A1c MFr Bld: 6.5 % (ref 4.6–6.5)

## 2022-03-22 ENCOUNTER — Ambulatory Visit (INDEPENDENT_AMBULATORY_CARE_PROVIDER_SITE_OTHER): Payer: Medicare Other | Admitting: Endocrinology

## 2022-03-22 ENCOUNTER — Encounter: Payer: Self-pay | Admitting: Endocrinology

## 2022-03-22 VITALS — BP 142/72 | HR 61 | Ht 69.5 in | Wt 224.8 lb

## 2022-03-22 DIAGNOSIS — E782 Mixed hyperlipidemia: Secondary | ICD-10-CM

## 2022-03-22 DIAGNOSIS — I2510 Atherosclerotic heart disease of native coronary artery without angina pectoris: Secondary | ICD-10-CM

## 2022-03-22 DIAGNOSIS — E1065 Type 1 diabetes mellitus with hyperglycemia: Secondary | ICD-10-CM | POA: Diagnosis not present

## 2022-03-22 NOTE — Progress Notes (Signed)
Patient ID: [name], female   DOB: 10/18/1946, 76 y.o.   MRN: [id number]   Reason for visit: DIABETES followup.  Diagnosis: Type 1 diabetes mellitus, date of diagnosis: 1979.    HISTORY of present illness:   Insulin Pump: CURRENT brand: T-Slim since 08/2020  BASAL settings:  Midnight = 0.55, 3:30 AM = 1.2, 6 AM = 2.25, 10 AM = 0.75,  2 PM = 1.25, 5 PM = 2.1, 7:30 PM = 2.1, 8 PM = 3.2 and 10 PM = 1.8.    Total daily insulin = usually about 65 units, maximum 90, using Lyumjev   Carbohydrate ratio 1: 12 breakfast, 1: 5 lunch and 1: 4.5 at dinner; sensitivity 1: 25 from 12 AM- 10 AM, 1: 30 from 10-11 AM and 330-5 PM and then 1: 25 target 145  Insulin on board indicator on, duration 4 hrs   DIABETES: She has had long-standing diabetes with A1c levels usually above target. In 10/13 pioglitazone was restarted because of her large insulin requirement and diabetic dyslipidemia.  Her blood sugars improved significantly with this.  Also has been taking metformin as insulin sensitizer  RECENT history:   Her A1c is back down to 6.5 now  Current diabetes management, blood sugar patterns from sensor and pump download:  Recent total daily insulin usage = 57 units  Insulin pump management management: She has still issues with periodic moderate hyperglycemia after lunch but overall appears to have better control with her A1c coming down and no excessive hypoglycemia Likely has high sugars after lunch because of inadequate boluses with mostly higher fat meals or snacks  Only occasionally will have a late or missed bolus  Blood sugars are less likely to be higher after dinner  She will do some correction boluses but not in time frequently  She is not quite understanding the time to peak for her bolus insulin  Also gradually gaining weight She does not exercise again No significant hypoglycemia  OVERNIGHT blood sugars are relatively well controlled with the sleep mode as  before The last couple of weeks the same time in target of 77% as before  HIGHEST blood sugars overall are after lunch and periodically after dinner   Her blood sugar analysis from the [facility] data for the last 2 weeks is interpreted as below  OVERNIGHT blood sugars are relatively stable with average 150 but occasionally higher at times especially before 6 AM  PRE-meal blood sugars before breakfast and midmorning are relatively evenly controlled with no hypoglycemia  POSTPRANDIAL readings are mostly assessed with lunch and dinner and has no breakfast boluses With lunch her blood sugars are highly variable with occasional low normal readings post bolus and otherwise may go up to over 350 Also blood sugars may stay high for a few hours after meals  Similarly blood sugars are higher after dinner at times but on an average less often  Highest variability is in the afternoon with standard deviation 71  Hypoglycemia has been arranged with only transient relatively low readings around 4-5 PM and rarely after a dinner bolus  Time in range is slightly better than the last time   Her CGM shows the following data for the last 2 weeks:    CGM use % of time 96  2-week average/SD 147  Time in range      80%  % Time Above 180 19  % Time above 250   % Time Below 70 1  PRE-MEAL  overnight  mornings  afternoon  evening Overall  Glucose range:     48-389  Averages: 151 123 167+/-71 145       CGM use % of time 95  2-week average/SD 152  Time in range 77  % Time Above 180 22  % Time above 250   % Time Below 70 1      PRE-MEAL  overnight  mornings  afternoon  evening Overall  Glucose range:       Averages: 150 129 149  180    Previously:   CGM use % of time 93  2-week average/SD 153  Time in range 77     %  % Time Above 180 22  % Time above 250   % Time Below 70 1      PRE-MEAL  overnight  mornings  afternoon  evening Overall  Glucose range:       Averages: 168 150 145 149        Hypoglycemic awareness: Has symptoms of feeling tired, headache, sweaty.  She thinks he can usually recognize symptoms  Sometimes may not be aware during the night and will be notified by her sensor that the sugar is low  She has symptoms when blood glucose is less than 60. Uses glucose tablets for treatment even overnight     DIET has been usually fairly controlled with carbohydrate and fat intake, tends to eat out on weekends The mealtimes are variable.    Food preferences: Yogurt and cottage cheese for breakfast usually.   Blood sugars with exercise: No significant problems recently with adequate control and no hypoglycemia  .  Wt Readings from Last 3 Encounters:  03/22/22 224 lb 12.8 oz (102 kg)  02/22/22 224 lb (101.6 kg)  01/18/22 219 lb (99.3 kg)    LABS:  Lab Results  Component Value Date   HGBA1C 6.5 03/19/2022   HGBA1C 6.8 (H) 10/11/2021   HGBA1C 7.2 (H) 06/15/2021   Lab Results  Component Value Date   MICROALBUR <0.7 06/15/2021   LDLCALC 43 10/11/2021   CREATININE 0.79 10/11/2021    Lab on 03/19/2022  Component Date Value Ref Range Status   Hgb A1c MFr Bld 03/19/2022 6.5  4.6 - 6.5 % Final   Glycemic Control Guidelines for People with Diabetes:Non Diabetic:  <6%Goal of Therapy: <7%Additional Action Suggested:  >8%    Glucose, Bld 03/19/2022 94  70 - 99 mg/dL Final     Allergies as of 03/22/2022       Reactions   Sulfa Antibiotics Rash   Sulfamethoxazole-trimethoprim Rash        Medication List        Accurate as of Mar 22, 2022  9:13 AM. If you have any questions, ask your nurse or doctor.          ASPIRIN 81 PO Take by mouth.   CALCIUM + D PO Take 600 mg by mouth 2 (two) times daily.   cholecalciferol 1000 units tablet Commonly known as: VITAMIN D Take 1,000 Units by mouth 2 (two) times daily.   clopidogrel 75 MG tablet Commonly known as: PLAVIX Take 1 tablet (75 mg total) by mouth daily. Please keep upcoming appt in  March 2023 withy Dr. Acie Fredrickson before anymore refills. Thank you Final Attempt   Dexcom G6 Sensor Misc by Does not apply route.   ezetimibe 10 MG tablet Commonly known as: ZETIA TAKE 1 TABLET DAILY   furosemide 20 MG tablet Commonly known  as: LASIX Take 1 tablet (20 mg total) by mouth as needed. Take 1 tablet by mouth as needed for 5 days.   gabapentin 100 MG capsule Commonly known as: NEURONTIN Take 2 capsules (200 mg total) by mouth at bedtime.   ibuprofen 200 MG tablet Commonly known as: ADVIL Take 200 mg by mouth every 6 (six) hours as needed.   ICAPS AREDS 2 PO Take 2 tablets by mouth daily.   losartan 25 MG tablet Commonly known as: COZAAR Take 1 tablet (25 mg total) by mouth daily.   Lyumjev 100 UNIT/ML Soln Generic drug: Insulin Lispro-aabc USE A MAXIMUM OF 90 UNITS DAILY VIA INSULIN PUMP   meloxicam 7.5 MG tablet Commonly known as: MOBIC Take 1 tablet (7.5 mg total) by mouth daily.   metFORMIN 500 MG 24 hr tablet Commonly known as: GLUCOPHAGE-XR TAKE 1 TABLET IN THE MORNING AND 2 TABLETS IN THE EVENING AS DIRECTED   metoprolol succinate 100 MG 24 hr tablet Commonly known as: TOPROL-XL TAKE 1 TABLET DAILY.   nitroGLYCERIN 0.4 MG SL tablet Commonly known as: NITROSTAT DISSOLVE 1 TABLET UNDER THE TONGUE EVERY 5 MINUTES AS NEEDED FOR CHEST PAIN   pioglitazone 30 MG tablet Commonly known as: Actos Take 1 tablet (30 mg total) by mouth daily.   potassium chloride 10 MEQ tablet Commonly known as: KLOR-CON Take 1 tablet (10 mEq total) by mouth daily as needed (take with Lasix).   rosuvastatin 40 MG tablet Commonly known as: CRESTOR Take 1 tablet (40 mg total) by mouth daily.   solifenacin 5 MG tablet Commonly known as: VESICARE Take 1 tablet (5 mg total) by mouth daily.   TART CHERRY PO Take by mouth.        Allergies:  Allergies  Allergen Reactions   Sulfa Antibiotics Rash   Sulfamethoxazole-Trimethoprim Rash    Past Medical History:   Diagnosis Date   Arthritis    OA   Atrial flutter ([place])    a. s/p TEE/DCCV 08/16/13 (normal LV function, no LAA thrombus).b. s/p ablation by Dr Lovena Le 09-23-2013   Coronary artery disease    2009 LAD Promus stent   Dysrhythmia    Aflutter, no issues after ablation 2014   Fibroid    Hyperlipidemia    Insulin pump in place    Osteopenia    Type 1 diabetes mellitus on insulin therapy ([place])    Valvular heart disease    a. Mild MR/TR by TEE 08/2013.    Past Surgical History:  Procedure Laterality Date   ABLATION  09-23-2013   CTI by Dr Lovena Le   APPENDECTOMY     APPLICATION OF WOUND VAC Left 04/11/2017   Procedure: APPLICATION OF WOUND VAC LEFT KNEE;  Surgeon: Rod Can, MD;  Location: [place];  Service: Orthopedics;  Laterality: Left;   ATRIAL FLUTTER ABLATION N/A 09/23/2013   Procedure: ATRIAL FLUTTER ABLATION;  Surgeon: Evans Lance, MD;  Location: [facility] CATH LAB;  Service: Cardiovascular;  Laterality: N/A;   CARDIOVERSION N/A 08/16/2013   Procedure: CARDIOVERSION;  Surgeon: Thayer Headings, MD;  Location: [place];  Service: Cardiovascular;  Laterality: N/A;  spoke with Tom   CATARACT EXTRACTION Bilateral    COLONOSCOPY     CORONARY ANGIOPLASTY WITH STENT [place]   ORIF PATELLA Left 04/11/2017   ORIF PATELLA Left 04/11/2017   Procedure: OPEN REDUCTION INTERNAL (ORIF) FIXATION PATELLA LEFT KNEE;  Surgeon: Rod Can, MD;  Location: [place];  Service: Orthopedics;  Laterality: Left;   PELVIC LAPAROSCOPY     TEE WITHOUT CARDIOVERSION N/A 08/16/2013   Procedure: TRANSESOPHAGEAL ECHOCARDIOGRAM (TEE);  Surgeon: Thayer Headings, MD;  Location: [facility] ENDOSCOPY;  Service: Cardiovascular;  Laterality: N/A;    Family History  Problem Relation Age of Onset   CAD Father 7       died age 58   CAD Brother 74       small vessel disease   Atrial fibrillation Mother    Diabetes Mother    Hypertension Mother    CAD Cousin        paternal   CAD Paternal  Grandfather        early onset   Colon cancer Neg Hx     Social History:  reports that she has never smoked. She has never used smokeless tobacco. She reports current alcohol use of about 5.0 standard drinks per week. She reports that she does not use drugs.  REVIEW of systems:  Following is a copy of the previous note  Cardiac function: Her echo shows only grade 1 diastolic dysfunction and normal systolic function  She has had atrial arrhythmias treated with metoprolol  Blood pressure usually normal, on 25 mg losartan from cardiologist for preventive reasons   BP Readings from Last 3 Encounters:  03/22/22 (!) 142/72  02/22/22 126/74  01/18/22 126/78     She has had diabetic dyslipidemia with significantly high LDL particle number This has been well controlled with Crestor 40 mg and Niaspan 1000 mg Her last LDL particle number was down to below 900, labs pending from today  Niacin was stopped because of tending to get flushing and itching which may be late at night, not generally prevented by aspirin, also she finds the regimen difficult to do at night  She has been consistent with generic Crestor 40 and Zetia without side effects This is still controlling HDL and improved particle number  Zetia added in 3/19  Labs as follows:  Lab Results  Component Value Date   CHOL 104 10/11/2021   CHOL 104 02/28/2021   CHOL 100 07/19/2020   Lab Results  Component Value Date   HDL 46.50 10/11/2021   HDL 47.10 02/28/2021   HDL 47.60 07/19/2020   Lab Results  Component Value Date   LDLCALC 43 10/11/2021   LDLCALC 39 02/28/2021   LDLCALC 40 07/19/2020   Lab Results  Component Value Date   TRIG 76.0 10/11/2021   TRIG 89.0 02/28/2021   TRIG 63.0 07/19/2020   Lab Results  Component Value Date   CHOLHDL 2 10/11/2021   CHOLHDL 2 02/28/2021   CHOLHDL 2 07/19/2020   Lab Results  Component Value Date   LDLDIRECT 79.0 01/11/2015   LDLDIRECT 73.1 10/13/2014      She  has had consistently normal thyroid levels  Lab Results  Component Value Date   TSH 2.33 10/11/2021   TSH 2.23 10/06/2019   TSH 1.86 01/19/2019    No retinopathy on last exam    BP (!) 142/72   Pulse 61   Ht 5' 9.5" (1.765 m)   Wt 224 lb 12.8 oz (102 kg)   SpO2 98%   BMI 32.72 kg/m      ASSESSMENT/PLAN:  DIABETES type 1 on T-slim insulin pump  Her A1c is further improved at 6.5   See history of present illness for detailed discussion of her current blood sugar patterns and management with her pump  Her insulin pump download and Dexcom  data on the download was reviewed, discussion above  Except for variability in postprandial blood sugars her overall control is good with current management Discussed day-to-day variability and causes for high and low sugars Discussed timing of boluses, duration of action of boluses, need for correction of persistently high readings postprandially with manual boluses Also discussed need for exercise for weight loss   Recommendations:  Basal rate will be changed as follows:  Basal rate at 3:30 AM will be 1.4 Start regular exercise Carbohydrate coverage to be the same Correction factor I: 20 in the afternoon between 1-5 PM   Hyperlipidemia: Follow-up on next visit   Follow-up in 4 months Total visit time includes Counseling = 30 minutes  There are no Patient Instructions on file for this visit.   Elayne Snare 03/22/22   Note: This office note was prepared with Dragon voice recognition system technology. Any transcriptional errors that result from this process are unintentional.

## 2022-03-22 NOTE — Patient Instructions (Signed)
Exercise 5/7   Lower fat meals at lunch

## 2022-03-26 ENCOUNTER — Other Ambulatory Visit: Payer: Self-pay | Admitting: Cardiovascular Disease

## 2022-04-04 NOTE — Progress Notes (Unsigned)
[name] [facility] [address] [place] Phone: [phone] Subjective:   [name], am serving as a scribe for Dr. Hulan Saas.  I'm seeing this patient by the request  of:  Hoyt Koch, MD  CC: Low back pain follow-up  GHW:[id number]  02/22/2022 Chronic arthritic changes.  Discussed potential meloxicam. Patient does take the Plavix and will do a very low dose at 7.5.  Encouraged her to discontinue the ibuprofen that she was using more frequently.  Patient will watch for any type of bruising.  We discussed which activities to do and which ones to avoid.  Patient still wants to hold on any type of formal physical therapy.  We will continue the gabapentin at night.  Worsening pain will consider the MRI  Update 04/05/2022 [name] is a 76 y.o. female coming in with complaint of LBP. Patient states back is doing better. Still taking gabapentin. Pain has decreased in intensity and frequency, but still has bad days. Takes the meloxicam in 5 day bouts when needed, seems to work better than just 3 days.       Past Medical History:  Diagnosis Date   Arthritis    OA   Atrial flutter ([place])    a. s/p TEE/DCCV 08/16/13 (normal LV function, no LAA thrombus).b. s/p ablation by Dr Lovena Le 09-23-2013   Coronary artery disease    2009 LAD Promus stent   Dysrhythmia    Aflutter, no issues after ablation 2014   Fibroid    Hyperlipidemia    Insulin pump in place    Osteopenia    Type 1 diabetes mellitus on insulin therapy ([place])    Valvular heart disease    a. Mild MR/TR by TEE 08/2013.   Past Surgical History:  Procedure Laterality Date   ABLATION  09-23-2013   CTI by Dr Lovena Le   APPENDECTOMY     APPLICATION OF WOUND VAC Left 04/11/2017   Procedure: APPLICATION OF WOUND VAC LEFT KNEE;  Surgeon: Rod Can, MD;  Location: [place];  Service: Orthopedics;  Laterality: Left;   ATRIAL FLUTTER ABLATION N/A 09/23/2013   Procedure:  ATRIAL FLUTTER ABLATION;  Surgeon: Evans Lance, MD;  Location: [facility] CATH LAB;  Service: Cardiovascular;  Laterality: N/A;   CARDIOVERSION N/A 08/16/2013   Procedure: CARDIOVERSION;  Surgeon: Thayer Headings, MD;  Location: [place];  Service: Cardiovascular;  Laterality: N/A;  spoke with Tom   CATARACT EXTRACTION Bilateral    COLONOSCOPY     CORONARY ANGIOPLASTY WITH STENT [place]   ORIF PATELLA Left 04/11/2017   ORIF PATELLA Left 04/11/2017   Procedure: OPEN REDUCTION INTERNAL (ORIF) FIXATION PATELLA LEFT KNEE;  Surgeon: Rod Can, MD;  Location: [place];  Service: Orthopedics;  Laterality: Left;   PELVIC LAPAROSCOPY     TEE WITHOUT CARDIOVERSION N/A 08/16/2013   Procedure: TRANSESOPHAGEAL ECHOCARDIOGRAM (TEE);  Surgeon: Thayer Headings, MD;  Location: [facility] ENDOSCOPY;  Service: Cardiovascular;  Laterality: N/A;   Social History   Socioeconomic History   Marital status: Married    Spouse name: Not on file   Number of children: 2   Years of education: Not on file   Highest education level: Not on file  Occupational History   Occupation: home maker    Comment: Children adopted  Tobacco Use   Smoking status: Never   Smokeless tobacco: Never  Vaping Use   Vaping Use: Never used  Substance  and Sexual Activity   Alcohol use: Yes    Alcohol/week: 5.0 standard drinks    Types: 5 Glasses of wine per week    Comment: on weekends   Drug use: No   Sexual activity: Yes    Birth control/protection: Post-menopausal  Other Topics Concern   Not on file  Social History Narrative   Not on file   Social Determinants of Health   Financial Resource Strain: Not on file  Food Insecurity: Not on file  Transportation Needs: Not on file  Physical Activity: Not on file  Stress: Not on file  Social Connections: Socially Integrated   Frequency of Communication with Friends and Family: More than three times a week   Frequency of Social Gatherings with Friends and  Family: Twice a week   Attends Religious Services: More than 4 times per year   Active Member of [employer] or Organizations: Yes   Attends Music therapist: More than 4 times per year   Marital Status: Married   Allergies  Allergen Reactions   Sulfa Antibiotics Rash   Sulfamethoxazole-Trimethoprim Rash   Family History  Problem Relation Age of Onset   CAD Father 44       died age 53   CAD Brother 88       small vessel disease   Atrial fibrillation Mother    Diabetes Mother    Hypertension Mother    CAD Cousin        paternal   CAD Paternal Grandfather        early onset   Colon cancer Neg Hx     Current Outpatient Medications (Endocrine & Metabolic):    Insulin Lispro-aabc (LYUMJEV) 100 UNIT/ML SOLN, USE A MAXIMUM OF 90 UNITS DAILY VIA INSULIN PUMP   metFORMIN (GLUCOPHAGE-XR) 500 MG 24 hr tablet, TAKE 1 TABLET IN THE MORNING AND 2 TABLETS IN THE EVENING AS DIRECTED   pioglitazone (ACTOS) 30 MG tablet, Take 1 tablet (30 mg total) by mouth daily.  Current Outpatient Medications (Cardiovascular):    ezetimibe (ZETIA) 10 MG tablet, TAKE 1 TABLET DAILY   furosemide (LASIX) 20 MG tablet, Take 1 tablet (20 mg total) by mouth as needed. Take 1 tablet by mouth as needed for 5 days. (Patient not taking: Reported on 02/28/2022)   losartan (COZAAR) 25 MG tablet, Take 1 tablet (25 mg total) by mouth daily.   metoprolol succinate (TOPROL-XL) 100 MG 24 hr tablet, TAKE 1 TABLET DAILY (KEEP UPCOMING APPOINTMENT IN MARCH 2023 WITH DR. Acie Fredrickson BEFORE ANYMORE REFILLS)   nitroGLYCERIN (NITROSTAT) 0.4 MG SL tablet, DISSOLVE 1 TABLET UNDER THE TONGUE EVERY 5 MINUTES AS NEEDED FOR CHEST PAIN   rosuvastatin (CRESTOR) 40 MG tablet, Take 1 tablet (40 mg total) by mouth daily.   Current Outpatient Medications (Analgesics):    ASPIRIN 81 PO, Take by mouth.   ibuprofen (ADVIL) 200 MG tablet, Take 200 mg by mouth every 6 (six) hours as needed.  Current Outpatient Medications (Hematological):     clopidogrel (PLAVIX) 75 MG tablet, Take 1 tablet (75 mg total) by mouth daily. Please keep upcoming appt in March 2023 withy Dr. Acie Fredrickson before anymore refills. Thank you Final Attempt  Current Outpatient Medications (Other):    Calcium Carbonate-Vitamin D (CALCIUM + D PO), Take 600 mg by mouth 2 (two) times daily.   cholecalciferol (VITAMIN D) 1000 units tablet, Take 1,000 Units by mouth 2 (two) times daily.    Continuous Blood Gluc Sensor (DEXCOM G6 SENSOR) MISC, by Does  not apply route.   gabapentin (NEURONTIN) 100 MG capsule, Take 2 capsules (200 mg total) by mouth at bedtime.   Multiple Vitamins-Minerals (ICAPS AREDS 2 PO), Take 2 tablets by mouth daily.   potassium chloride (K-DUR) 10 MEQ tablet, Take 1 tablet (10 mEq total) by mouth daily as needed (take with Lasix). (Patient not taking: Reported on 02/28/2022)   solifenacin (VESICARE) 5 MG tablet, Take 1 tablet (5 mg total) by mouth daily.   TART CHERRY PO, Take by mouth.   Review of Systems:  No headache, visual changes, nausea, vomiting, diarrhea, constipation, dizziness, abdominal pain, skin rash, fevers, chills, night sweats, weight loss, swollen lymph nodes, body aches, joint swelling, chest pain, shortness of breath, mood changes. POSITIVE muscle aches  Objective  Blood pressure 122/60, pulse 73, height '5\' 9"'$  (1.753 m), weight 226 lb (102.5 kg), SpO2 95 %.   General: No apparent distress alert and oriented x3 mood and affect normal, dressed appropriately.  HEENT: Pupils equal, extraocular movements intact  Respiratory: Patient's speak in full sentences and does not appear short of breath  Cardiovascular: No lower extremity edema, non tender, no erythema  Gait normal with good balance and coordination.  MSK: Patient back does have some mild loss of lordosis.  Minimal tenderness to palpation today.  Patient does have minor tightness still with FABER test.  Mild bruising noted over the anterior aspect of the knee.    Impression  and Recommendations:    The above documentation has been reviewed and is accurate and complete Lyndal Pulley, DO

## 2022-04-05 ENCOUNTER — Encounter: Payer: Self-pay | Admitting: Family Medicine

## 2022-04-05 ENCOUNTER — Ambulatory Visit (INDEPENDENT_AMBULATORY_CARE_PROVIDER_SITE_OTHER): Payer: Medicare Other | Admitting: Family Medicine

## 2022-04-05 DIAGNOSIS — M545 Low back pain, unspecified: Secondary | ICD-10-CM | POA: Diagnosis not present

## 2022-04-05 DIAGNOSIS — G8929 Other chronic pain: Secondary | ICD-10-CM

## 2022-04-05 DIAGNOSIS — I2510 Atherosclerotic heart disease of native coronary artery without angina pectoris: Secondary | ICD-10-CM

## 2022-04-05 NOTE — Patient Instructions (Signed)
Great to see you Try 2IBU 2x  day for 3 days-watch for bruising Send picture of [name]'s ankle See me again in 3 months

## 2022-04-05 NOTE — Assessment & Plan Note (Signed)
Seems to be doing better with conservative therapy at this moment.  Discussed icing regimen and home exercises, discussed avoiding certain activities.  Patient is still on the Plavix.  Discussed with patient again about the anti-inflammatories.  We will discontinue the meloxicam at this point.  Patient can take ibuprofen only if she truly needs it severely.  Once again discussed the possibility of injections at some point if necessary.  Follow-up again in 3 months with conservative therapy and patient is doing okay

## 2022-04-11 ENCOUNTER — Encounter: Payer: Self-pay | Admitting: Endocrinology

## 2022-04-24 DIAGNOSIS — H903 Sensorineural hearing loss, bilateral: Secondary | ICD-10-CM | POA: Diagnosis not present

## 2022-04-26 ENCOUNTER — Encounter: Payer: Self-pay | Admitting: Family Medicine

## 2022-04-26 ENCOUNTER — Other Ambulatory Visit: Payer: Self-pay | Admitting: Family Medicine

## 2022-04-29 MED ORDER — GABAPENTIN 100 MG PO CAPS
200.0000 mg | ORAL_CAPSULE | Freq: Every day | ORAL | 0 refills | Status: DC
Start: 2022-04-29 — End: 2022-07-23

## 2022-05-05 DIAGNOSIS — Z23 Encounter for immunization: Secondary | ICD-10-CM | POA: Diagnosis not present

## 2022-05-06 DIAGNOSIS — Z23 Encounter for immunization: Secondary | ICD-10-CM | POA: Diagnosis not present

## 2022-05-08 ENCOUNTER — Other Ambulatory Visit: Payer: Self-pay | Admitting: Cardiovascular Disease

## 2022-05-16 DIAGNOSIS — Z1231 Encounter for screening mammogram for malignant neoplasm of breast: Secondary | ICD-10-CM | POA: Diagnosis not present

## 2022-05-27 ENCOUNTER — Other Ambulatory Visit: Payer: Self-pay | Admitting: Endocrinology

## 2022-06-27 ENCOUNTER — Other Ambulatory Visit: Payer: Self-pay | Admitting: Endocrinology

## 2022-07-03 NOTE — Progress Notes (Signed)
Zach Jeremaih Klima [place] [address] [place] Phone: [phone] Subjective:   [name], am serving as a scribe for Dr. Hulan Saas.  I'm seeing this patient by the request  of:  Hoyt Koch, MD  CC: low back pain follow up   DPO:[id number]  04/05/2022 Seems to be doing better with conservative therapy at this moment.  Discussed icing regimen and home exercises, discussed avoiding certain activities.  Patient is still on the Plavix.  Discussed with patient again about the anti-inflammatories.  We will discontinue the meloxicam at this point.  Patient can take ibuprofen only if she truly needs it severely.  Once again discussed the possibility of injections at some point if necessary.  Follow-up again in 3 months with conservative therapy and patient is doing okay  Update 07/10/2022 AMY GOTHARD is a 76 y.o. female coming in with complaint of LBP. Patient states back is doing better. Doing the exercises on a somewhat consistent bases is contributing to that. No new issues.      Past Medical History:  Diagnosis Date   Arthritis    OA   Atrial flutter ([place])    a. s/p TEE/DCCV 08/16/13 (normal LV function, no LAA thrombus).b. s/p ablation by Dr Lovena Le 09-23-2013   Coronary artery disease    2009 LAD Promus stent   Dysrhythmia    Aflutter, no issues after ablation 2014   Fibroid    Hyperlipidemia    Insulin pump in place    Osteopenia    Type 1 diabetes mellitus on insulin therapy ([place])    Valvular heart disease    a. Mild MR/TR by TEE 08/2013.   Past Surgical History:  Procedure Laterality Date   ABLATION  09-23-2013   CTI by Dr Lovena Le   APPENDECTOMY     APPLICATION OF WOUND VAC Left 04/11/2017   Procedure: APPLICATION OF WOUND VAC LEFT KNEE;  Surgeon: Rod Can, MD;  Location: [facility];  Service: Orthopedics;  Laterality: Left;   ATRIAL FLUTTER ABLATION N/A 09/23/2013   Procedure: ATRIAL FLUTTER ABLATION;  Surgeon: Evans Lance, MD;  Location: [facility] CATH LAB;  Service: Cardiovascular;  Laterality: N/A;   CARDIOVERSION N/A 08/16/2013   Procedure: CARDIOVERSION;  Surgeon: Thayer Headings, MD;  Location: [place];  Service: Cardiovascular;  Laterality: N/A;  spoke with Tom   CATARACT EXTRACTION Bilateral    COLONOSCOPY     CORONARY ANGIOPLASTY WITH STENT [employer]   ORIF PATELLA Left 04/11/2017   ORIF PATELLA Left 04/11/2017   Procedure: OPEN REDUCTION INTERNAL (ORIF) FIXATION PATELLA LEFT KNEE;  Surgeon: Rod Can, MD;  Location: [place];  Service: Orthopedics;  Laterality: Left;   PELVIC LAPAROSCOPY     TEE WITHOUT CARDIOVERSION N/A 08/16/2013   Procedure: TRANSESOPHAGEAL ECHOCARDIOGRAM (TEE);  Surgeon: Thayer Headings, MD;  Location: [facility] ENDOSCOPY;  Service: Cardiovascular;  Laterality: N/A;   Social History   Socioeconomic History   Marital status: Married    Spouse name: Not on file   Number of children: 2   Years of education: Not on file   Highest education level: Not on file  Occupational History   Occupation: home maker    Comment: Children adopted  Tobacco Use   Smoking status: Never   Smokeless tobacco: Never  Vaping Use   Vaping Use: Never used  Substance and Sexual Activity   Alcohol use: Yes    Alcohol/week: 5.0 standard  drinks of alcohol    Types: 5 Glasses of wine per week    Comment: on weekends   Drug use: No   Sexual activity: Yes    Birth control/protection: Post-menopausal  Other Topics Concern   Not on file  Social History Narrative   Not on file   Social Determinants of Health   Financial Resource Strain: Low Risk  (03/02/2021)   Overall Financial Resource Strain (CARDIA)    Difficulty of Paying Living Expenses: Not hard at all  Food Insecurity: Not on file  Transportation Needs: Not on file  Physical Activity: Not on file  Stress: Not on file  Social Connections: Socially Integrated (05/14/2021)   Social Connection and Isolation Panel  [NHANES]    Frequency of Communication with Friends and Family: More than three times a week    Frequency of Social Gatherings with Friends and Family: Twice a week    Attends Religious Services: More than 4 times per year    Active Member of [employer] or Organizations: Yes    Attends Music therapist: More than 4 times per year    Marital Status: Married   Allergies  Allergen Reactions   Sulfa Antibiotics Rash   Sulfamethoxazole-Trimethoprim Rash   Family History  Problem Relation Age of Onset   CAD Father 37       died age 11   CAD Brother 51       small vessel disease   Atrial fibrillation Mother    Diabetes Mother    Hypertension Mother    CAD Cousin        paternal   CAD Paternal Grandfather        early onset   Colon cancer Neg Hx     Current Outpatient Medications (Endocrine & Metabolic):    LYUMJEV 469 UNIT/ML SOLN, USE A MAXIMUM OF 90 UNITS DAILY VIA INSULIN PUMP   metFORMIN (GLUCOPHAGE-XR) 500 MG 24 hr tablet, TAKE 1 TABLET IN THE MORNING AND 2 TABLETS IN THE EVENING AS DIRECTED   pioglitazone (ACTOS) 30 MG tablet, Take 1 tablet (30 mg total) by mouth daily.  Current Outpatient Medications (Cardiovascular):    ezetimibe (ZETIA) 10 MG tablet, TAKE 1 TABLET DAILY   furosemide (LASIX) 20 MG tablet, Take 1 tablet (20 mg total) by mouth as needed. Take 1 tablet by mouth as needed for 5 days. (Patient not taking: Reported on 02/28/2022)   losartan (COZAAR) 25 MG tablet, Take 1 tablet (25 mg total) by mouth daily.   metoprolol succinate (TOPROL-XL) 100 MG 24 hr tablet, TAKE 1 TABLET DAILY (KEEP UPCOMING APPOINTMENT IN MARCH 2023 WITH DR. Acie Fredrickson BEFORE ANYMORE REFILLS)   nitroGLYCERIN (NITROSTAT) 0.4 MG SL tablet, DISSOLVE 1 TABLET UNDER THE TONGUE EVERY 5 MINUTES AS NEEDED FOR CHEST PAIN   rosuvastatin (CRESTOR) 40 MG tablet, Take 1 tablet (40 mg total) by mouth daily.   Current Outpatient Medications (Analgesics):    ASPIRIN 81 PO, Take by mouth.   ibuprofen  (ADVIL) 200 MG tablet, Take 200 mg by mouth every 6 (six) hours as needed.  Current Outpatient Medications (Hematological):    clopidogrel (PLAVIX) 75 MG tablet, TAKE 1 TABLET DAILY (KEEP UPCOMING APPOINTMENT IN MARCH 2023 WITH DR. Acie Fredrickson BEFORE ANYMORE REFILLS)  Current Outpatient Medications (Other):    Calcium Carbonate-Vitamin D (CALCIUM + D PO), Take 600 mg by mouth 2 (two) times daily.   cholecalciferol (VITAMIN D) 1000 units tablet, Take 1,000 Units by mouth 2 (two) times daily.  Continuous Blood Gluc Sensor (DEXCOM G6 SENSOR) MISC, by Does not apply route.   gabapentin (NEURONTIN) 100 MG capsule, Take 2 capsules (200 mg total) by mouth at bedtime.   Multiple Vitamins-Minerals (ICAPS AREDS 2 PO), Take 2 tablets by mouth daily.   potassium chloride (K-DUR) 10 MEQ tablet, Take 1 tablet (10 mEq total) by mouth daily as needed (take with Lasix). (Patient not taking: Reported on 02/28/2022)   solifenacin (VESICARE) 5 MG tablet, Take 1 tablet (5 mg total) by mouth daily.   TART CHERRY PO, Take by mouth.    Review of Systems:  No headache, visual changes, nausea, vomiting, diarrhea, constipation, dizziness, abdominal pain, skin rash, fevers, chills, night sweats, weight loss, swollen lymph nodes, body aches, joint swelling, chest pain, shortness of breath, mood changes. POSITIVE muscle aches  Objective  Blood pressure 124/70, pulse 69, height '5\' 9"'$  (1.753 m), weight 229 lb (103.9 kg), SpO2 96 %.   General: No apparent distress alert and oriented x3 mood and affect normal, dressed appropriately.  HEENT: Pupils equal, extraocular movements intact  Respiratory: Patient's speak in full sentences and does not appear short of breath  Cardiovascular: No lower extremity edema, non tender, no erythema  Lower back exam does have some loss of lordosis.  Patient has improvement though noted it with the FABER test noted.  Patient does have some slowness getting out of bed to chair.  Patient denies any  significant weakness.    Impression and Recommendations:    The above documentation has been reviewed and is accurate and complete Lyndal Pulley, DO

## 2022-07-05 ENCOUNTER — Encounter: Payer: Self-pay | Admitting: Internal Medicine

## 2022-07-10 ENCOUNTER — Ambulatory Visit (INDEPENDENT_AMBULATORY_CARE_PROVIDER_SITE_OTHER): Payer: Medicare Other | Admitting: Family Medicine

## 2022-07-10 DIAGNOSIS — M545 Low back pain, unspecified: Secondary | ICD-10-CM

## 2022-07-10 DIAGNOSIS — G8929 Other chronic pain: Secondary | ICD-10-CM | POA: Diagnosis not present

## 2022-07-10 DIAGNOSIS — I2510 Atherosclerotic heart disease of native coronary artery without angina pectoris: Secondary | ICD-10-CM | POA: Diagnosis not present

## 2022-07-10 NOTE — Assessment & Plan Note (Signed)
Discussed with patient at great length.  We discussed with her being somewhat noncompliant with some of the exercises.  Continues formal physical therapy again if necessary.  Patient is finding herself from being relatively active though.  We discussed continue to work on core strength and weight loss.  Follow-up with me again in 3 to 4 months

## 2022-07-10 NOTE — Patient Instructions (Signed)
Always great to catch up If anything worsening you know where we are

## 2022-07-11 ENCOUNTER — Other Ambulatory Visit (INDEPENDENT_AMBULATORY_CARE_PROVIDER_SITE_OTHER): Payer: Medicare Other

## 2022-07-11 DIAGNOSIS — E1065 Type 1 diabetes mellitus with hyperglycemia: Secondary | ICD-10-CM

## 2022-07-11 DIAGNOSIS — E782 Mixed hyperlipidemia: Secondary | ICD-10-CM

## 2022-07-11 LAB — LIPID PANEL
Cholesterol: 105 mg/dL (ref 0–200)
HDL: 43 mg/dL (ref >39.00)
LDL Cholesterol: 45 mg/dL (ref 0–99)
NonHDL: 61.53
Total CHOL/HDL Ratio: 2
Triglycerides: 82 mg/dL (ref 0.0–149.0)
VLDL: 16.4 mg/dL (ref 0.0–40.0)

## 2022-07-11 LAB — COMPREHENSIVE METABOLIC PANEL
ALT: 21 U/L (ref 0–35)
AST: 22 U/L (ref 0–37)
Albumin: 3.9 g/dL (ref 3.5–5.2)
Alkaline Phosphatase: 80 U/L (ref 39–117)
BUN: 19 mg/dL (ref 6–23)
CO2: 29 mEq/L (ref 19–32)
Calcium: 9.3 mg/dL (ref 8.4–10.5)
Chloride: 103 mEq/L (ref 96–112)
Creatinine, Ser: 0.83 mg/dL (ref 0.40–1.20)
GFR: 69.37 mL/min (ref >60.00)
Glucose, Bld: 103 mg/dL — ABNORMAL HIGH (ref 70–99)
Potassium: 4.7 mEq/L (ref 3.5–5.1)
Sodium: 138 mEq/L (ref 135–145)
Total Bilirubin: 0.4 mg/dL (ref 0.2–1.2)
Total Protein: 7 g/dL (ref 6.0–8.3)

## 2022-07-11 LAB — MICROALBUMIN / CREATININE URINE RATIO
Creatinine,U: 60.7 mg/dL
Microalb Creat Ratio: 1.2 mg/g (ref 0.0–30.0)
Microalb, Ur: <0.7 mg/dL (ref 0.0–1.9)

## 2022-07-11 LAB — HEMOGLOBIN A1C: Hgb A1c MFr Bld: 6.6 % — ABNORMAL HIGH (ref 4.6–6.5)

## 2022-07-15 DIAGNOSIS — Z23 Encounter for immunization: Secondary | ICD-10-CM | POA: Diagnosis not present

## 2022-07-19 ENCOUNTER — Telehealth: Payer: Self-pay

## 2022-07-19 NOTE — Telephone Encounter (Signed)
[facility] requested last office notes be faxed to [phone]. Office notes faxed.

## 2022-07-23 ENCOUNTER — Other Ambulatory Visit: Payer: Self-pay | Admitting: Family Medicine

## 2022-07-25 ENCOUNTER — Telehealth (INDEPENDENT_AMBULATORY_CARE_PROVIDER_SITE_OTHER): Payer: Medicare Other | Admitting: Endocrinology

## 2022-07-25 ENCOUNTER — Other Ambulatory Visit: Payer: Self-pay | Admitting: Internal Medicine

## 2022-07-25 DIAGNOSIS — E1065 Type 1 diabetes mellitus with hyperglycemia: Secondary | ICD-10-CM

## 2022-07-25 DIAGNOSIS — I2510 Atherosclerotic heart disease of native coronary artery without angina pectoris: Secondary | ICD-10-CM

## 2022-07-25 DIAGNOSIS — E782 Mixed hyperlipidemia: Secondary | ICD-10-CM

## 2022-07-25 NOTE — Patient Instructions (Signed)
Stop Actos 

## 2022-07-25 NOTE — Progress Notes (Signed)
Patient ID: [name], female   DOB: [birth year], 76 y.o.   MRN: [id number]  I connected with the above-named patient by video enabled telemedicine application and verified that I am speaking with the correct person. The patient was explained the limitations of evaluation and management by telemedicine and the availability of in person appointments.  Patient also understood that there may be a patient responsible charge related to this service  Location of the patient: Patient's home  Location of the provider: Physician office Only the patient and myself were participating in the encounter The patient understood the above statements and agreed to proceed.  Reason for visit: DIABETES followup.  Diagnosis: Type 1 diabetes mellitus, date of diagnosis: 1979.    HISTORY of present illness:   Insulin Pump: CURRENT brand: T-Slim since 08/2020  BASAL settings:  Midnight = 0.55, 3:30 AM = 1.4, 6 AM = 2.25, 10 AM = 0.75,  2 PM = 1.25, 5 PM = 2.1, 7:30 PM = 2.1, 8 PM = 3.2 and 10 PM = 1.8.    Total daily insulin = usually about 60 units, maximum 90, using Lyumjev   Carbohydrate ratio 1: 12 breakfast, 1: 5 lunch and 1: 4.5 at dinner; sensitivity 1: 25 from 12 AM- 10 AM, 1: 30 from 10-11 AM and 330-5 PM and then 1: 25 target 145  Insulin on board indicator on, duration 4 hrs   DIABETES: She has had long-standing diabetes with A1c levels usually above target. In 08/2012 pioglitazone was restarted because of her large insulin requirement and diabetic dyslipidemia.  Her blood sugars improved significantly with this.  Also has been taking metformin as insulin sensitizer  RECENT history:   Her A1c is consistent at 6.6  Current diabetes management, blood sugar patterns from sensor and pump download:  Recent total daily insulin usage = 55 units  Insulin pump management management: Blood sugars recently are excellent with time in range 82% However still has difficulty getting  coverage for certain meals especially eating out such as hot dogs This may cause high readings periodically at lunch and dinner but overall blood sugars are well controlled Boluses are being done on time most of the time She is trying to walk and late mornings but this does not affect her sugars She is concerned about her weight continuing to based staying up Overnight blood sugars are averaging better with increasing basal rate on the last visit HIGHEST blood sugars overall are after dinner but not consistent   Her blood sugar analysis from the [facility] data for the last 2 weeks is interpreted as below Data is posted from the media at the end of the record  OVERNIGHT blood sugars are usually well controlled with average 143 and no hypoglycemia, may be periodically higher especially late at night getting lower from the evening  No hypoglycemia overnight PRE-meal blood sugars are mildly increased before lunch but usually low normal before dinner POSTPRANDIAL readings are generally controlled but highly variable and has had significantly high readings after lunch or dinner periodically, highest reading nearly 400 after lunch last Thursday Hypoglycemia has been minimal with only tendency to relatively low normal readings around 4-5 PM and low sugars only once recently Time in range is better than the last time   Her CGM shows the following data for the last 2 weeks:    CGM use % of time   2-week average/SD 144  Time in range 82  % Time Above 180 17  %  Time above 250   % Time Below 70 2      PRE-MEAL  overnight  mornings  afternoon  evening Overall  Glucose range:       Averages: 143 123 149 152+/-54    Previously:   CGM use % of time 96  2-week average/SD 147  Time in range      80%  % Time Above 180 19  % Time above 250   % Time Below 70 1      PRE-MEAL  overnight  mornings  afternoon  evening Overall  Glucose range:     48-389  Averages: 151 123 167+/-71 145        CGM use % of time 95  2-week average/SD 152  Time in range 77  % Time Above 180 22  % Time above 250   % Time Below 70 1      PRE-MEAL  overnight  mornings  afternoon  evening Overall  Glucose range:       Averages: 150 129 149  180    Previously:   CGM use % of time 93  2-week average/SD 153  Time in range 77     %  % Time Above 180 22  % Time above 250   % Time Below 70 1      PRE-MEAL  overnight  mornings  afternoon  evening Overall  Glucose range:       Averages: 168 150 145 149       Hypoglycemic awareness: Has symptoms of feeling tired, headache, sweaty.  She thinks he can usually recognize symptoms  Sometimes may not be aware during the night and will be notified by her sensor that the sugar is low  She has symptoms when blood glucose is less than 60. Uses glucose tablets for treatment even overnight     DIET has been usually fairly controlled with carbohydrate and fat intake, tends to eat out on weekends The mealtimes are variable.    Food preferences: Yogurt and cottage cheese for breakfast usually.   Blood sugars with exercise: No significant problems recently with adequate control and no hypoglycemia  .  Wt Readings from Last 3 Encounters:  07/10/22 229 lb (103.9 kg)  04/05/22 226 lb (102.5 kg)  03/22/22 224 lb 12.8 oz (102 kg)    LABS:  Lab Results  Component Value Date   HGBA1C 6.6 (H) 07/11/2022   HGBA1C 6.5 03/19/2022   HGBA1C 6.8 (H) 10/11/2021   Lab Results  Component Value Date   MICROALBUR <0.7 07/11/2022   LDLCALC 45 07/11/2022   CREATININE 0.83 07/11/2022    No visits with results within 1 Week(s) from this visit.  Latest known visit with results is:  Lab on 07/11/2022  Component Date Value Ref Range Status   Cholesterol 07/11/2022 105  0 - 200 mg/dL Final   ATP III Classification       Desirable:  < 200 mg/dL               Borderline High:  200 - 239 mg/dL          High:  > = 240 mg/dL   Triglycerides 07/11/2022 82.0   0.0 - 149.0 mg/dL Final   Normal:  <150 mg/dLBorderline High:  150 - 199 mg/dL   HDL 07/11/2022 43.00  >39.00 mg/dL Final   VLDL 07/11/2022 16.4  0.0 - 40.0 mg/dL Final   LDL Cholesterol 07/11/2022 45  0 - 99 mg/dL Final  Total CHOL/HDL Ratio 07/11/2022 2   Final                  Men          Women1/2 Average Risk     3.4          3.3Average Risk          5.0          4.42X Average Risk          9.6          7.13X Average Risk          15.0          11.0                       NonHDL 07/11/2022 61.53   Final   NOTE:  Non-HDL goal should be 30 mg/dL higher than patient's LDL goal (i.e. LDL goal of < 70 mg/dL, would have non-HDL goal of < 100 mg/dL)   Microalb, Ur 07/11/2022 <0.7  0.0 - 1.9 mg/dL Final   Creatinine,U 07/11/2022 60.7  mg/dL Final   Microalb Creat Ratio 07/11/2022 1.2  0.0 - 30.0 mg/g Final   Sodium 07/11/2022 138  135 - 145 mEq/L Final   Potassium 07/11/2022 4.7  3.5 - 5.1 mEq/L Final   Chloride 07/11/2022 103  96 - 112 mEq/L Final   CO2 07/11/2022 29  19 - 32 mEq/L Final   Glucose, Bld 07/11/2022 103 (H)  70 - 99 mg/dL Final   BUN 07/11/2022 19  6 - 23 mg/dL Final   Creatinine, Ser 07/11/2022 0.83  0.40 - 1.20 mg/dL Final   Total Bilirubin 07/11/2022 0.4  0.2 - 1.2 mg/dL Final   Alkaline Phosphatase 07/11/2022 80  39 - 117 U/L Final   AST 07/11/2022 22  0 - 37 U/L Final   ALT 07/11/2022 21  0 - 35 U/L Final   Total Protein 07/11/2022 7.0  6.0 - 8.3 g/dL Final   Albumin 07/11/2022 3.9  3.5 - 5.2 g/dL Final   GFR 07/11/2022 69.37  >60.00 mL/min Final   Calculated using the CKD-EPI Creatinine Equation (2021)   Calcium 07/11/2022 9.3  8.4 - 10.5 mg/dL Final   Hgb A1c MFr Bld 07/11/2022 6.6 (H)  4.6 - 6.5 % Final   Glycemic Control Guidelines for People with Diabetes:Non Diabetic:  <6%Goal of Therapy: <7%Additional Action Suggested:  >8%      Allergies as of 07/25/2022       Reactions   Sulfa Antibiotics Rash   Sulfamethoxazole-trimethoprim Rash        Medication  List        Accurate as of July 25, 2022 11:00 AM. If you have any questions, ask your nurse or doctor.          ASPIRIN 81 PO Take by mouth.   CALCIUM + D PO Take 600 mg by mouth 2 (two) times daily.   cholecalciferol 1000 units tablet Commonly known as: VITAMIN D Take 1,000 Units by mouth 2 (two) times daily.   clopidogrel 75 MG tablet Commonly known as: PLAVIX TAKE 1 TABLET DAILY (KEEP UPCOMING APPOINTMENT IN MARCH 2023 WITH DR. Acie Fredrickson BEFORE ANYMORE REFILLS)   Dexcom G6 Sensor Misc by Does not apply route.   ezetimibe 10 MG tablet Commonly known as: ZETIA TAKE 1 TABLET DAILY   furosemide 20 MG tablet Commonly known as: LASIX Take 1 tablet (20 mg total) by mouth  as needed. Take 1 tablet by mouth as needed for 5 days.   gabapentin 100 MG capsule Commonly known as: NEURONTIN TAKE 2 CAPSULES(200 MG) BY MOUTH AT BEDTIME   ibuprofen 200 MG tablet Commonly known as: ADVIL Take 200 mg by mouth every 6 (six) hours as needed.   ICAPS AREDS 2 PO Take 2 tablets by mouth daily.   losartan 25 MG tablet Commonly known as: COZAAR Take 1 tablet (25 mg total) by mouth daily.   Lyumjev 100 UNIT/ML Soln Generic drug: Insulin Lispro-aabc USE A MAXIMUM OF 90 UNITS DAILY VIA INSULIN PUMP   metFORMIN 500 MG 24 hr tablet Commonly known as: GLUCOPHAGE-XR TAKE 1 TABLET IN THE MORNING AND 2 TABLETS IN THE EVENING AS DIRECTED   metoprolol succinate 100 MG 24 hr tablet Commonly known as: TOPROL-XL TAKE 1 TABLET DAILY (KEEP UPCOMING APPOINTMENT IN MARCH 2023 WITH DR. Acie Fredrickson BEFORE ANYMORE REFILLS)   nitroGLYCERIN 0.4 MG SL tablet Commonly known as: NITROSTAT DISSOLVE 1 TABLET UNDER THE TONGUE EVERY 5 MINUTES AS NEEDED FOR CHEST PAIN   pioglitazone 30 MG tablet Commonly known as: Actos Take 1 tablet (30 mg total) by mouth daily.   potassium chloride 10 MEQ tablet Commonly known as: KLOR-CON Take 1 tablet (10 mEq total) by mouth daily as needed (take with Lasix).    rosuvastatin 40 MG tablet Commonly known as: CRESTOR Take 1 tablet (40 mg total) by mouth daily.   solifenacin 5 MG tablet Commonly known as: VESICARE TAKE 1 TABLET(5 MG) BY MOUTH DAILY What changed: See the new instructions. Changed by: Hoyt Koch, MD   TART CHERRY PO Take by mouth.        Allergies:  Allergies  Allergen Reactions   Sulfa Antibiotics Rash   Sulfamethoxazole-Trimethoprim Rash    Past Medical History:  Diagnosis Date   Arthritis    OA   Atrial flutter ([place])    a. s/p TEE/DCCV 08/16/13 (normal LV function, no LAA thrombus).b. s/p ablation by Dr Lovena Le 09-23-2013   Coronary artery disease    2009 LAD Promus stent   Dysrhythmia    Aflutter, no issues after ablation 2014   Fibroid    Hyperlipidemia    Insulin pump in place    Osteopenia    Type 1 diabetes mellitus on insulin therapy ([place])    Valvular heart disease    a. Mild MR/TR by TEE 08/2013.    Past Surgical History:  Procedure Laterality Date   ABLATION  09-23-2013   CTI by Dr Lovena Le   APPENDECTOMY     APPLICATION OF WOUND VAC Left 04/11/2017   Procedure: APPLICATION OF WOUND VAC LEFT KNEE;  Surgeon: Rod Can, MD;  Location: [place];  Service: Orthopedics;  Laterality: Left;   ATRIAL FLUTTER ABLATION N/A 09/23/2013   Procedure: ATRIAL FLUTTER ABLATION;  Surgeon: Evans Lance, MD;  Location: [facility] CATH LAB;  Service: Cardiovascular;  Laterality: N/A;   CARDIOVERSION N/A 08/16/2013   Procedure: CARDIOVERSION;  Surgeon: Thayer Headings, MD;  Location: [place];  Service: Cardiovascular;  Laterality: N/A;  spoke with Tom   CATARACT EXTRACTION Bilateral    COLONOSCOPY     CORONARY ANGIOPLASTY WITH STENT [place]   ORIF PATELLA Left 04/11/2017   ORIF PATELLA Left 04/11/2017   Procedure: OPEN REDUCTION INTERNAL (ORIF) FIXATION PATELLA LEFT KNEE;  Surgeon: Rod Can, MD;  Location: [place];  Service: Orthopedics;  Laterality: Left;   PELVIC LAPAROSCOPY  TEE WITHOUT CARDIOVERSION N/A 08/16/2013   Procedure: TRANSESOPHAGEAL ECHOCARDIOGRAM (TEE);  Surgeon: Thayer Headings, MD;  Location: [facility] ENDOSCOPY;  Service: Cardiovascular;  Laterality: N/A;    Family History  Problem Relation Age of Onset   CAD Father 65       died age 68   CAD Brother 34       small vessel disease   Atrial fibrillation Mother    Diabetes Mother    Hypertension Mother    CAD Cousin        paternal   CAD Paternal Grandfather        early onset   Colon cancer Neg Hx     Social History:  reports that she has never smoked. She has never used smokeless tobacco. She reports current alcohol use of about 5.0 standard drinks of alcohol per week. She reports that she does not use drugs.  REVIEW of systems:   Cardiac function: Her echo shows only grade 1 diastolic dysfunction and normal systolic function  She has had atrial arrhythmias treated with metoprolol  Blood pressure consistently normal, on 25 mg losartan from cardiologist for preventive reasons   BP Readings from Last 3 Encounters:  07/10/22 124/70  04/05/22 122/60  03/22/22 (!) 142/72     She has had diabetic dyslipidemia with significantly high LDL particle number This has been well controlled with Crestor 40 mg and Niaspan 1000 mg Her last LDL particle number was down to below 900 previously  Niacin was stopped because of tending to get flushing and itching which may be late at night, not generally prevented by aspirin, also she finds the regimen difficult to do at night  She has been consistent with generic Crestor 40 and Zetia without side effects  Zetia added in 3/19  Labs as follows:  Lab Results  Component Value Date   CHOL 105 07/11/2022   CHOL 104 10/11/2021   CHOL 104 02/28/2021   Lab Results  Component Value Date   HDL 43.00 07/11/2022   HDL 46.50 10/11/2021   HDL 47.10 02/28/2021   Lab Results  Component Value Date   LDLCALC 45 07/11/2022   LDLCALC 43 10/11/2021    LDLCALC 39 02/28/2021   Lab Results  Component Value Date   TRIG 82.0 07/11/2022   TRIG 76.0 10/11/2021   TRIG 89.0 02/28/2021   Lab Results  Component Value Date   CHOLHDL 2 07/11/2022   CHOLHDL 2 10/11/2021   CHOLHDL 2 02/28/2021   Lab Results  Component Value Date   LDLDIRECT 79.0 01/11/2015   LDLDIRECT 73.1 10/13/2014     She has had consistently normal thyroid levels  Lab Results  Component Value Date   TSH 2.33 10/11/2021   TSH 2.23 10/06/2019   TSH 1.86 01/19/2019    No retinopathy on last exam    There were no vitals taken for this visit.     ASSESSMENT/PLAN:  DIABETES type 1 on T-slim insulin pump  Her A1c is stable at 6.6 compared to 6.5   See history of present illness for detailed discussion of her current blood sugar patterns seen on the CGM and management with insulin pump  Time in range and excellent 82% However still has tendency to some significantly high postprandial readings including once to nearly 400 This is likely to be from eating out and higher fat meals Otherwise overall blood sugars are well controlled postprandially  She does not appear to have a tendency to low normal or low  sugars between about 5-8 PM  She is concerned about her difficulty losing weight, currently doing only some walking for exercise However not clear if she will need to continue taking Actos for insulin resistance, requiring less insulin than before   Recommendations:  Basal rate will be changed as follows:  Basal rate at 4:30 PM will be 1.8 through 8 PM Increase frequency and duration of aerobic exercise, can also add some weights Carbohydrate coverage to be the same  She will leave off Actos and let us know if her blood sugars start going up, we will also continue to monitor her lipids with this   Hyperlipidemia: All parameters controlled and she will continue Crestor and Zetia   Follow-up in 4 months  Total visit time includes Counseling = 30  minutes  There are no Patient Instructions on file for this visit.   Elayne Snare 07/25/22   Note: This office note was prepared with Dragon voice recognition system technology. Any transcriptional errors that result from this process are unintentional.

## 2022-07-29 DIAGNOSIS — Z23 Encounter for immunization: Secondary | ICD-10-CM | POA: Diagnosis not present

## 2022-07-31 ENCOUNTER — Encounter: Payer: Self-pay | Admitting: Internal Medicine

## 2022-07-31 ENCOUNTER — Ambulatory Visit (INDEPENDENT_AMBULATORY_CARE_PROVIDER_SITE_OTHER): Payer: Medicare Other | Admitting: Internal Medicine

## 2022-07-31 VITALS — BP 128/64 | HR 74 | Temp 97.6°F | Ht 69.0 in | Wt 221.0 lb

## 2022-07-31 DIAGNOSIS — I2510 Atherosclerotic heart disease of native coronary artery without angina pectoris: Secondary | ICD-10-CM | POA: Diagnosis not present

## 2022-07-31 DIAGNOSIS — E108 Type 1 diabetes mellitus with unspecified complications: Secondary | ICD-10-CM | POA: Diagnosis not present

## 2022-07-31 DIAGNOSIS — L0231 Cutaneous abscess of buttock: Secondary | ICD-10-CM | POA: Insufficient documentation

## 2022-07-31 MED ORDER — CEPHALEXIN 500 MG PO CAPS: 500.0000 mg | ORAL_CAPSULE | Freq: Two times a day (BID) | ORAL | 0 refills | Status: AC

## 2022-07-31 NOTE — Assessment & Plan Note (Signed)
Partial rupture about 1 week ago. There is still some firmness under the wound which is not amenable to I and D. Rx keflex 500 mg BID for 1 week to help clear. Use bandages and antibiotic ointment on this. Monitor closely if not healing come back or let us know.

## 2022-07-31 NOTE — Assessment & Plan Note (Signed)
She is having some labile sugars with this wound. Foot exam done. Advised that she will need to monitor sugars with the wound and the diabetes can cause delayed wound healing.

## 2022-07-31 NOTE — Patient Instructions (Signed)
We have sent in keflex to take 1 pill twice a day for 1 week.

## 2022-07-31 NOTE — Progress Notes (Signed)
   Subjective:   Patient ID: [name], female    DOB: 09/11/1946, 76 y.o.   MRN: [id number]  HPI The patient is a 76 YO female coming in for wound left buttock. Started 1-2 weeks ago. Overall stable.  Review of Systems  Constitutional: Negative.   HENT: Negative.    Eyes: Negative.   Respiratory:  Negative for cough, chest tightness and shortness of breath.   Cardiovascular:  Negative for chest pain, palpitations and leg swelling.  Gastrointestinal:  Negative for abdominal distention, abdominal pain, constipation, diarrhea, nausea and vomiting.  Musculoskeletal: Negative.   Skin:  Positive for wound.  Neurological: Negative.   Psychiatric/Behavioral: Negative.      Objective:  Physical Exam Constitutional:      Appearance: She is well-developed.  HENT:     Head: Normocephalic and atraumatic.  Cardiovascular:     Rate and Rhythm: Normal rate and regular rhythm.  Pulmonary:     Effort: Pulmonary effort is normal. No respiratory distress.     Breath sounds: Normal breath sounds. No wheezing or rales.  Abdominal:     General: Bowel sounds are normal. There is no distension.     Palpations: Abdomen is soft.     Tenderness: There is no abdominal tenderness. There is no rebound.  Musculoskeletal:     Cervical back: Normal range of motion.  Skin:    General: Skin is warm and dry.     Comments: Foot exam done. Wound left buttock with about 3 cm oval area with good granulation tissue. Some firmness under the wound no fluctuance  Neurological:     Mental Status: She is alert and oriented to person, place, and time.     Coordination: Coordination normal.     Vitals:   07/31/22 0827  BP: 128/64  Pulse: 74  Temp: 97.6 F (36.4 C)  SpO2: 94%  Weight: 221 lb (100.2 kg)  Height: '5\' 9"'$  (1.753 m)    Assessment & Plan:

## 2022-08-29 ENCOUNTER — Telehealth: Payer: Medicare Other

## 2022-09-05 ENCOUNTER — Ambulatory Visit: Payer: Medicare Other

## 2022-09-09 ENCOUNTER — Ambulatory Visit: Payer: Medicare Other

## 2022-09-09 NOTE — Progress Notes (Unsigned)
Subjective:   [name] is a 76 y.o. female who presents for Medicare Annual (Subsequent) preventive examination. I connected with  MOIRA UMHOLTZ on 09/10/22 by a audio enabled telemedicine application and verified that I am speaking with the correct person using two identifiers.  Patient Location: Home  Provider Location: Home Office  I discussed the limitations of evaluation and management by telemedicine. The patient expressed understanding and agreed to proceed.  Review of Systems    Deferred to PCP Cardiac Risk Factors include: advanced age (>12mn, >>72women);diabetes mellitus;dyslipidemia;hypertension     Objective:    There were no vitals filed for this visit. There is no height or weight on file to calculate BMI.     09/10/2022   10:29 AM 11/02/2021    9:40 AM 05/14/2021   12:49 PM 03/20/2021   11:16 AM 04/20/2018   10:22 AM 04/11/2017    6:45 AM 03/22/2017    1:52 PM  Advanced Directives  Does Patient Have a Medical Advance Directive? Yes No Yes Yes Yes Yes No  Type of AParamedicof AMount KiscoLiving will  HBruceLiving will HHarding-Birch LakesLiving will  HMcDonaldLiving will   Does patient want to make changes to medical advance directive? No - Patient declined  No - Patient declined No - Patient declined No - Patient declined No - Patient declined   Copy of HMontalvin Manorin Chart? No - copy requested  No - copy requested No - copy requested  No - copy requested     Current Medications (verified) Outpatient Encounter Medications as of 09/10/2022  Medication Sig   ASPIRIN 81 PO Take by mouth.   Calcium Carbonate-Vitamin D (CALCIUM + D PO) Take 600 mg by mouth 2 (two) times daily.   cholecalciferol (VITAMIN D) 1000 units tablet Take 1,000 Units by mouth 2 (two) times daily.    clopidogrel (PLAVIX) 75 MG tablet TAKE 1 TABLET DAILY (KEEP UPCOMING APPOINTMENT IN MARCH 2023 WITH  DR. NAcie FredricksonBEFORE ANYMORE REFILLS)   Continuous Blood Gluc Sensor (DEXCOM G6 SENSOR) MISC by Does not apply route.   ezetimibe (ZETIA) 10 MG tablet TAKE 1 TABLET DAILY   gabapentin (NEURONTIN) 100 MG capsule TAKE 2 CAPSULES(200 MG) BY MOUTH AT BEDTIME   ibuprofen (ADVIL) 200 MG tablet Take 200 mg by mouth every 6 (six) hours as needed.   losartan (COZAAR) 25 MG tablet Take 1 tablet (25 mg total) by mouth daily.   LYUMJEV 100 UNIT/ML SOLN USE A MAXIMUM OF 90 UNITS DAILY VIA INSULIN PUMP   metFORMIN (GLUCOPHAGE-XR) 500 MG 24 hr tablet TAKE 1 TABLET IN THE MORNING AND 2 TABLETS IN THE EVENING AS DIRECTED   metoprolol succinate (TOPROL-XL) 100 MG 24 hr tablet TAKE 1 TABLET DAILY (KEEP UPCOMING APPOINTMENT IN MARCH 2023 WITH DR. NAcie FredricksonBEFORE ANYMORE REFILLS)   Multiple Vitamins-Minerals (ICAPS AREDS 2 PO) Take 2 tablets by mouth daily.   nitroGLYCERIN (NITROSTAT) 0.4 MG SL tablet DISSOLVE 1 TABLET UNDER THE TONGUE EVERY 5 MINUTES AS NEEDED FOR CHEST PAIN   rosuvastatin (CRESTOR) 40 MG tablet Take 1 tablet (40 mg total) by mouth daily.   solifenacin (VESICARE) 5 MG tablet TAKE 1 TABLET(5 MG) BY MOUTH DAILY   TART CHERRY PO Take by mouth.   pioglitazone (ACTOS) 30 MG tablet Take 1 tablet (30 mg total) by mouth daily. (Patient not taking: Reported on 07/31/2022)   No facility-administered encounter medications on file as  of 09/10/2022.    Allergies (verified) Sulfa antibiotics and Sulfamethoxazole-trimethoprim   History: Past Medical History:  Diagnosis Date   Arthritis    OA   Atrial flutter ([place])    a. s/p TEE/DCCV 08/16/13 (normal LV function, no LAA thrombus).b. s/p ablation by Dr Lovena Le 09-23-2013   Coronary artery disease    2009 LAD Promus stent   Dysrhythmia    Aflutter, no issues after ablation 2014   Fibroid    Hyperlipidemia    Insulin pump in place    Osteopenia    Type 1 diabetes mellitus on insulin therapy ([place])    Valvular heart disease    a. Mild MR/TR by TEE 08/2013.    Past Surgical History:  Procedure Laterality Date   ABLATION  09-23-2013   CTI by Dr Lovena Le   APPENDECTOMY     APPLICATION OF WOUND VAC Left 04/11/2017   Procedure: APPLICATION OF WOUND VAC LEFT KNEE;  Surgeon: Rod Can, MD;  Location: [facility];  Service: Orthopedics;  Laterality: Left;   ATRIAL FLUTTER ABLATION N/A 09/23/2013   Procedure: ATRIAL FLUTTER ABLATION;  Surgeon: Evans Lance, MD;  Location: [facility] CATH LAB;  Service: Cardiovascular;  Laterality: N/A;   CARDIOVERSION N/A 08/16/2013   Procedure: CARDIOVERSION;  Surgeon: Thayer Headings, MD;  Location: [facility];  Service: Cardiovascular;  Laterality: N/A;  spoke with Tom   CATARACT EXTRACTION Bilateral    COLONOSCOPY     CORONARY ANGIOPLASTY WITH STENT [place]   ORIF PATELLA Left 04/11/2017   ORIF PATELLA Left 04/11/2017   Procedure: OPEN REDUCTION INTERNAL (ORIF) FIXATION PATELLA LEFT KNEE;  Surgeon: Rod Can, MD;  Location: [facility];  Service: Orthopedics;  Laterality: Left;   PELVIC LAPAROSCOPY     TEE WITHOUT CARDIOVERSION N/A 08/16/2013   Procedure: TRANSESOPHAGEAL ECHOCARDIOGRAM (TEE);  Surgeon: Thayer Headings, MD;  [facility] ENDOSCOPY;  Service: Cardiovascular;  Laterality: N/A;   Family History  Problem Relation Age of Onset   CAD Father 32       died age 64   CAD Brother 32       small vessel disease   Atrial fibrillation Mother    Diabetes Mother    Hypertension Mother    CAD Cousin        paternal   CAD Paternal Grandfather        early onset   Colon cancer Neg Hx    Social History   Socioeconomic History   Marital status: Married    Spouse name: [name]   Number of children: 2   Years of education: 14   Highest education level: Not on file  Occupational History   Occupation: home maker    Comment: Children adopted  Tobacco Use   Smoking status: Never   Smokeless tobacco: Never  Vaping Use   Vaping Use: Never used  Substance and Sexual Activity   Alcohol use:  Yes    Alcohol/week: 5.0 standard drinks of alcohol    Types: 5 Glasses of Lorrin Bodner per week    Comment: on weekends   Drug use: No   Sexual activity: Yes    Birth control/protection: Post-menopausal  Other Topics Concern   Not on file  Social History Narrative   Not on file   Social Determinants of Health   Financial Resource Strain: Low Risk  (09/10/2022)   Overall Financial Resource Strain (CARDIA)    Difficulty of Paying Living Expenses: Not hard at all  Food Insecurity: No Food Insecurity (09/10/2022)   Hunger Vital Sign    Worried About Running Out of Food in the Last Year: Never true    Ran Out of Food in the Last Year: Never true  Transportation Needs: No Transportation Needs (09/10/2022)   PRAPARE - Hydrologist (Medical): No    Lack of Transportation (Non-Medical): No  Physical Activity: Sufficiently Active (09/10/2022)   Exercise Vital Sign    Days of Exercise per Week: 4 days    Minutes of Exercise per Session: 40 min  Stress: No Stress Concern Present (09/10/2022)   [place]    Feeling of Stress : Not at all  Social Connections: [place] (09/10/2022)   Social Connection and Isolation Panel [NHANES]    Frequency of Communication with Friends and Family: More than three times a week    Frequency of Social Gatherings with Friends and Family: More than three times a week    Attends Religious Services: More than 4 times per year    Active Member of [employer] or Organizations: Yes    Attends Music therapist: More than 4 times per year    Marital Status: Married    Tobacco Counseling Counseling given: Not Answered   Clinical Intake:  Pre-visit preparation completed: Yes  Pain : No/denies pain     Nutritional Status: BMI > 30  Obese Nutritional Risks: None Diabetes: Yes CBG done?: No (phone visit) Did pt. bring in CBG monitor from home?: No (Phone  visit)  How often do you need to have someone help you when you read instructions, pamphlets, or other written materials from your doctor or pharmacy?: 1 - Never What is the last grade level you completed in school?: college  Diabetic?Yes Nutrition Risk Assessment:  Has the patient had any N/V/D within the last 2 months?  No  Does the patient have any non-healing wounds?  No  Has the patient had any unintentional weight loss or weight gain?  No   Diabetes:  Is the patient diabetic?  Yes  If diabetic, was a CBG obtained today?  No  Did the patient bring in their glucometer from home?  No  How often do you monitor your CBG's? CGM.   Financial Strains and Diabetes Management:  Are you having any financial strains with the device, your supplies or your medication? No .  Does the patient want to be seen by Chronic Care Management for management of their diabetes?  No  Would the patient like to be referred to a Nutritionist or for Diabetic Management?  No   Diabetic Exams:  Diabetic Eye Exam: Completed 10/10/21 Diabetic Foot Exam: Completed 07/31/22  Interpreter Needed?: No  Information entered by :: Emelia Loron RN   Activities of Daily Living    09/10/2022   10:26 AM  In your present state of health, do you have any difficulty performing the following activities:  Hearing? 1  Comment followed by audiology; some deficit especially in left ear  Vision? 0  Difficulty concentrating or making decisions? 0  Walking or climbing stairs? 0  Dressing or bathing? 0  Doing errands, shopping? 0  Preparing Food and eating ? N  Using the Toilet? N  In the past six months, have you accidently leaked urine? N  Do you have problems with loss of bowel control? N  Managing your Medications? N  Managing your Finances? N  Housekeeping or managing  your Housekeeping? N    Patient Care Team: Hoyt Koch, MD as PCP - General (Internal Medicine) [name], Wonda Cheng, MD as PCP - Cardiology  (Cardiology) Elayne Snare, MD (Endocrinology) [name], Wonda Cheng, MD (Cardiology) Lindwood Coke, MD (Dermatology) Evans Lance, MD (Cardiology) Lafayette Dragon, MD (Inactive) (Gastroenterology) Lyndal Pulley, DO (Sports Medicine) [name], Heather, [place] as Consulting Physician (Optometry) Szabat, Darnelle Maffucci, [facility] (Inactive) (Pharmacist)  Indicate any recent Medical Services you may have received from other than [facility] providers in the past year (date may be approximate).     Assessment:   This is a routine wellness examination for Faithlyn.  Hearing/Vision screen No results found.  Dietary issues and exercise activities discussed: Current Exercise Habits: Home exercise routine, Type of exercise: walking, Time (Minutes): 40, Frequency (Times/Week): 4, Weekly Exercise (Minutes/Week): 160, Intensity: Mild, Exercise limited by: None identified   Goals Addressed             This Visit's Progress    Patient Stated       I want to go to the fitness center more to increase my exercise.       Depression Screen    09/10/2022   10:21 AM 01/08/2022   10:32 AM 05/14/2021    1:09 PM 02/14/2021    9:18 AM 01/20/2020   10:16 AM 04/20/2018   10:20 AM 11/13/2017    1:28 PM  PHQ 2/9 Scores  PHQ - 2 Score 0 0 0 0 1 0 0    Fall Risk    09/10/2022   10:28 AM 01/08/2022   10:32 AM 05/14/2021   12:53 PM 01/19/2021    9:44 AM 01/20/2020   10:16 AM  Fall Risk   Falls in the past year? 0 0 0 0 0  Number falls in past yr: 0  0 0 0  Injury with Fall? 0  0 0 0  Risk for fall due to : History of fall(s)  No Fall Risks    Follow up Falls evaluation completed  Falls evaluation completed      [place]:  Any stairs in or around the home? Yes  If so, are there any without handrails? Yes  Home free of loose throw rugs in walkways, pet beds, electrical cords, etc? Yes  Adequate lighting in your home to reduce risk of falls? Yes   ASSISTIVE DEVICES UTILIZED TO PREVENT  FALLS:  Life alert? No  Use of a cane, walker or w/c? No  Grab bars in the bathroom? No  Shower chair or bench in shower? No  Elevated toilet seat or a handicapped toilet? No   Cognitive Function:        09/10/2022   10:30 AM  6CIT Screen  What Year? 0 points  What month? 0 points  What time? 0 points  Count back from 20 0 points  Months in reverse 0 points  Repeat phrase 0 points  Total Score 0 points    Immunizations Immunization History  Administered Date(s) Administered   Fluad Quad(high Dose 65+) 07/15/2022   Hep A / Hep B 06/13/2016   Influenza Split 08/04/2012   Influenza, High Dose Seasonal PF 07/18/2016, 08/12/2017, 07/28/2018, 07/28/2018, 08/04/2021   Influenza,inj,Quad PF,6+ Mos 08/09/2013, 08/11/2014   Influenza-Unspecified 08/16/2015, 08/13/2020   PFIZER(Purple Top)SARS-COV-2 Vaccination 11/23/2019, 12/14/2019, 07/18/2020, 03/04/2021, 07/05/2021   PNEUMOCOCCAL CONJUGATE-20 04/05/2022, 05/05/2022   Pfizer Covid-19 Vaccine Bivalent Booster 70yr & up 05/06/2022   Pneumococcal Conjugate-13 11/07/2014  Pneumococcal Polysaccharide-23 11/05/2004, 09/24/2013   Pneumococcal-Unspecified 05/05/2022   Td 01/14/1997   Tdap 03/25/2012, 07/15/2022   Zoster Recombinat (Shingrix) 08/18/2018, 10/22/2018   Zoster, Live 11/04/2010    TDAP status: Up to date  Flu Vaccine status: Up to date  Pneumococcal vaccine status: Up to date  Covid-19 vaccine status: Information provided on how to obtain vaccines.   Qualifies for Shingles Vaccine? Yes   Zostavax completed No   Shingrix Completed?: Yes  Screening Tests Health Maintenance  Topic Date Due   COVID-19 Vaccine (7 - Pfizer series) 09/06/2022   OPHTHALMOLOGY EXAM  10/10/2022   HEMOGLOBIN A1C  01/09/2023   MAMMOGRAM  05/15/2023   Diabetic kidney evaluation - GFR measurement  07/12/2023   Diabetic kidney evaluation - Urine ACR  07/12/2023   FOOT EXAM  08/01/2023   Medicare Annual Wellness (AWV)  09/11/2023    COLONOSCOPY (Pts 45-58yr Insurance coverage will need to be confirmed)  07/08/2024   TETANUS/TDAP  07/15/2032   Pneumonia Vaccine 74 Years old  Completed   INFLUENZA VACCINE  Completed   DEXA SCAN  Completed   Hepatitis C Screening  Completed   Zoster Vaccines- Shingrix  Completed   HPV VACCINES  Aged Out    Health Maintenance  Health Maintenance Due  Topic Date Due   COVID-19 Vaccine (7 - Pfizer series) 09/06/2022    Colorectal cancer screening: Type of screening: Colonoscopy. Completed 07/08/14. Repeat every 10 years  Mammogram status: Completed 05/14/21. Repeat every year  Bone Density status: Completed 11/21/14. Results reflect: Bone density results: NORMAL. Repeat every once years.  Lung Cancer Screening: (Low Dose CT Chest recommended if Age 74-80years, 30 pack-year currently smoking OR have quit w/in 15years.) does not qualify.   Additional Screening:  Hepatitis C Screening: does qualify; Completed 11/08/15  Vision Screening: Recommended annual ophthalmology exams for early detection of glaucoma and other disorders of the eye. Is the patient up to date with their annual eye exam?  Yes  Who is the provider or what is the name of the office in which the patient attends annual eye exams? [facility] If pt is not established with a provider, would they like to be referred to a provider to establish care?  N/A .   Dental Screening: Recommended annual dental exams for proper oral hygiene  Community Resource Referral / Chronic Care Management: CRR required this visit?  No   CCM required this visit?  No      Plan:     I have personally reviewed and noted the following in the patient's chart:   Medical and social history Use of alcohol, tobacco or illicit drugs  Current medications and supplements including opioid prescriptions. Patient is not currently taking opioid prescriptions. Functional ability and status Nutritional status Physical activity Advanced  directives List of other physicians Hospitalizations, surgeries, and ER visits in previous 12 months Vitals Screenings to include cognitive, depression, and falls Referrals and appointments  In addition, I have reviewed and discussed with patient certain preventive protocols, quality metrics, and best practice recommendations. A written personalized care plan for preventive services as well as general preventive health recommendations were provided to patient.     JMichiel Cowboy RN   09/10/2022   Nurse Notes:  Ms. PSonntag, Thank you for taking time to come for your Medicare Wellness Visit. I appreciate your ongoing commitment to your health goals. Please review the following plan we discussed and let me know if I can assist you  in the future.   These are the goals we discussed:  Goals      Manage My Medicine     Timeframe:  Long-Range Goal Priority:  Medium Start Date:       03/02/21                      Expected End Date:   03/01/2023                  Follow Up Date 08/2022   - call for medicine refill 2 or 3 days before it runs out - call if I am sick and can't take my medicine - keep a list of all the medicines I take; vitamins and herbals too - use a pillbox to sort medicine    Why is this important?   These steps will help you keep on track with your medicines.   Notes:      Patient Stated     I want to go to the fitness center more to increase my exercise.         This is a list of the screening recommended for you and due dates:  Health Maintenance  Topic Date Due   COVID-19 Vaccine (7 - [place] series) 09/06/2022   Eye exam for diabetics  10/10/2022   Hemoglobin A1C  01/09/2023   Mammogram  05/15/2023   Yearly kidney function blood test for diabetes  07/12/2023   Yearly kidney health urinalysis for diabetes  07/12/2023   Complete foot exam   08/01/2023   Medicare Annual Wellness Visit  09/11/2023   Colon Cancer Screening  07/08/2024   Tetanus Vaccine   07/15/2032   Pneumonia Vaccine  Completed   Flu Shot  Completed   DEXA scan (bone density measurement)  Completed   Hepatitis C Screening: USPSTF Recommendation to screen - Ages 7-79 yo.  Completed   Zoster (Shingles) Vaccine  Completed   HPV Vaccine  Aged Out

## 2022-09-09 NOTE — Patient Instructions (Signed)

## 2022-09-10 ENCOUNTER — Ambulatory Visit (INDEPENDENT_AMBULATORY_CARE_PROVIDER_SITE_OTHER): Payer: Medicare Other | Admitting: *Deleted

## 2022-09-10 ENCOUNTER — Encounter: Payer: Self-pay | Admitting: Internal Medicine

## 2022-09-10 DIAGNOSIS — Z0000 Encounter for general adult medical examination without abnormal findings: Secondary | ICD-10-CM | POA: Diagnosis not present

## 2022-09-19 ENCOUNTER — Encounter: Payer: Self-pay | Admitting: Internal Medicine

## 2022-09-19 NOTE — Telephone Encounter (Signed)
Pt called back and said she has not had a covid test but she will go do one now and call back to let us know the results.

## 2022-09-19 NOTE — Telephone Encounter (Signed)
Called patient and left voicemail to call us back to get her scheuduled

## 2022-09-20 ENCOUNTER — Other Ambulatory Visit: Payer: Self-pay | Admitting: Endocrinology

## 2022-09-20 DIAGNOSIS — I10 Essential (primary) hypertension: Secondary | ICD-10-CM

## 2022-09-25 ENCOUNTER — Ambulatory Visit (INDEPENDENT_AMBULATORY_CARE_PROVIDER_SITE_OTHER): Payer: Medicare Other | Admitting: Internal Medicine

## 2022-09-25 ENCOUNTER — Encounter: Payer: Self-pay | Admitting: Internal Medicine

## 2022-09-25 VITALS — BP 120/60 | HR 86 | Temp 97.8°F | Ht 69.0 in | Wt 218.0 lb

## 2022-09-25 DIAGNOSIS — I2510 Atherosclerotic heart disease of native coronary artery without angina pectoris: Secondary | ICD-10-CM

## 2022-09-25 DIAGNOSIS — R052 Subacute cough: Secondary | ICD-10-CM | POA: Insufficient documentation

## 2022-09-25 DIAGNOSIS — N393 Stress incontinence (female) (male): Secondary | ICD-10-CM

## 2022-09-25 MED ORDER — AZITHROMYCIN 250 MG PO TABS: ORAL_TABLET | ORAL | 0 refills | Status: AC

## 2022-09-25 MED ORDER — SOLIFENACIN SUCCINATE 10 MG PO TABS: 10.0000 mg | ORAL_TABLET | Freq: Every day | ORAL | 3 refills | Status: DC

## 2022-09-25 NOTE — Patient Instructions (Signed)
I have sent in a z-pack for you and [name]. Take 2 pills today, then 1 pill daily starting tomorrow.   We have also sent in prednisone for [name] to take 2 pills daily for 5 days.

## 2022-09-25 NOTE — Assessment & Plan Note (Addendum)
Suspect CAP with rhonchi on exam and 2 weeks of symptoms without improvement. Rx azithromcyin 5 day course.

## 2022-09-25 NOTE — Assessment & Plan Note (Signed)
Vesicare helping some will increase to 10 mg daily as this is still a problem.

## 2022-09-25 NOTE — Progress Notes (Signed)
   Subjective:   Patient ID: [name], female    DOB: [birth year], 76 y.o.   MRN: [id number]  Cough Associated symptoms include myalgias and postnasal drip. Pertinent negatives include no chills, ear pain, fever, rhinorrhea, sore throat, shortness of breath or wheezing.   The patient is a 76 YO female coming in for acute sick 2 weeks not improving.  Review of Systems  Constitutional:  Positive for activity change. Negative for appetite change, chills, fatigue, fever and unexpected weight change.  HENT:  Positive for congestion and postnasal drip. Negative for ear discharge, ear pain, rhinorrhea, sinus pressure, sinus pain, sneezing, sore throat, tinnitus, trouble swallowing and voice change.   Eyes: Negative.   Respiratory:  Positive for cough. Negative for chest tightness, shortness of breath and wheezing.   Cardiovascular: Negative.   Gastrointestinal: Negative.   Musculoskeletal:  Positive for myalgias.  Neurological: Negative.     Objective:  Physical Exam Constitutional:      Appearance: She is well-developed.  HENT:     Head: Normocephalic and atraumatic.  Cardiovascular:     Rate and Rhythm: Normal rate and regular rhythm.  Pulmonary:     Effort: Pulmonary effort is normal. No respiratory distress.     Breath sounds: Rhonchi present. No wheezing or rales.  Abdominal:     General: Bowel sounds are normal. There is no distension.     Palpations: Abdomen is soft.     Tenderness: There is no abdominal tenderness. There is no rebound.  Musculoskeletal:     Cervical back: Normal range of motion.  Skin:    General: Skin is warm and dry.  Neurological:     Mental Status: She is alert and oriented to person, place, and time.     Coordination: Coordination normal.     Vitals:   09/25/22 0954  BP: 120/60  Pulse: 86  Temp: 97.8 F (36.6 C)  TempSrc: Oral  SpO2: 93%  Weight: 218 lb (98.9 kg)  Height: '5\' 9"'$  (1.753 m)    Assessment & Plan:

## 2022-10-11 ENCOUNTER — Telehealth: Payer: Self-pay

## 2022-10-11 NOTE — Telephone Encounter (Signed)
[facility] requested most recent chart notes to be faxed to [phone]. Chart notes sent.

## 2022-10-18 ENCOUNTER — Encounter: Payer: Self-pay | Admitting: Family Medicine

## 2022-10-21 ENCOUNTER — Encounter: Payer: Self-pay | Admitting: Family Medicine

## 2022-10-27 ENCOUNTER — Encounter: Payer: Self-pay | Admitting: Family Medicine

## 2022-11-01 ENCOUNTER — Encounter: Payer: Self-pay | Admitting: Internal Medicine

## 2022-11-01 ENCOUNTER — Ambulatory Visit (INDEPENDENT_AMBULATORY_CARE_PROVIDER_SITE_OTHER): Payer: Medicare Other | Admitting: Internal Medicine

## 2022-11-01 VITALS — BP 140/70 | HR 75 | Temp 97.5°F | Ht 69.0 in | Wt 220.0 lb

## 2022-11-01 DIAGNOSIS — I2510 Atherosclerotic heart disease of native coronary artery without angina pectoris: Secondary | ICD-10-CM

## 2022-11-01 DIAGNOSIS — N3000 Acute cystitis without hematuria: Secondary | ICD-10-CM

## 2022-11-01 LAB — POCT URINALYSIS DIPSTICK
Bilirubin, UA: NEGATIVE
Blood, UA: POSITIVE
Glucose, UA: NEGATIVE
Ketones, UA: NEGATIVE
Nitrite, UA: NEGATIVE
Protein, UA: POSITIVE — AB
Spec Grav, UA: 1.015 (ref 1.010–1.025)
Urobilinogen, UA: NEGATIVE E.U./dL — AB
pH, UA: 7 (ref 5.0–8.0)

## 2022-11-01 MED ORDER — CEPHALEXIN 500 MG PO CAPS: 500.0000 mg | ORAL_CAPSULE | Freq: Two times a day (BID) | ORAL | 0 refills | Status: AC

## 2022-11-01 NOTE — Assessment & Plan Note (Signed)
POC U/A consistent with infection. Rx keflex 5 day course.

## 2022-11-01 NOTE — Progress Notes (Signed)
   Subjective:   Patient ID: [name], female    DOB: Aug 05, 1946, 76 y.o.   MRN: [id number]  Urinary Tract Infection  Associated symptoms include urgency. Pertinent negatives include no frequency, nausea or vomiting.   The patient is a 76 YO female coming in for possible UTI.   Review of Systems  Constitutional: Negative.   Respiratory: Negative.    Cardiovascular: Negative.   Gastrointestinal:  Negative for abdominal distention, abdominal pain, constipation, diarrhea, nausea and vomiting.  Genitourinary:  Positive for dysuria and urgency. Negative for frequency.  Musculoskeletal: Negative.   Skin: Negative.     Objective:  Physical Exam Constitutional:      Appearance: Normal appearance. She is well-developed.  HENT:     Head: Normocephalic and atraumatic.  Eyes:     Extraocular Movements: Extraocular movements intact.  Cardiovascular:     Rate and Rhythm: Normal rate and regular rhythm.  Pulmonary:     Effort: Pulmonary effort is normal. No respiratory distress.     Breath sounds: Normal breath sounds. No wheezing or rales.  Abdominal:     General: Bowel sounds are normal. There is no distension.     Palpations: Abdomen is soft.     Tenderness: There is no abdominal tenderness. There is no rebound.  Musculoskeletal:     Cervical back: Normal range of motion.  Skin:    General: Skin is warm and dry.  Neurological:     Mental Status: She is alert and oriented to person, place, and time.     Coordination: Coordination normal.    Vitals:   11/01/22 1454  BP: (!) 140/70  Pulse: 75  Temp: (!) 97.5 F (36.4 C)  TempSrc: Oral  SpO2: 96%  Weight: 220 lb (99.8 kg)  Height: '5\' 9"'$  (1.753 m)    Assessment & Plan:

## 2022-11-01 NOTE — Patient Instructions (Addendum)
We have sent in keflex to take 1 pill twice a day for 5 days. 

## 2022-11-06 NOTE — Progress Notes (Deleted)
[place] Phone: [phone] Subjective:    I'm seeing this patient by the request  of:  Hoyt Koch, MD  CC:   [id number]  07/10/2022 Discussed with patient at great length.  We discussed with her being somewhat noncompliant with some of the exercises.  Continues formal physical therapy again if necessary.  Patient is finding herself from being relatively active though.  We discussed continue to work on core strength and weight loss.  Follow-up with me again in 3 to 4 months      Update 11/13/2022 [name] is a 77 y.o. female coming in with complaint of LBP. Patient states       Past Medical History:  Diagnosis Date   Arthritis    OA   Atrial flutter ([place])    a. s/p TEE/DCCV 08/16/13 (normal LV function, no LAA thrombus).b. s/p ablation by Dr Lovena Le 09-23-2013   Coronary artery disease    2009 LAD Promus stent   Dysrhythmia    Aflutter, no issues after ablation 2014   Fibroid    Hyperlipidemia    Insulin pump in place    Osteopenia    Type 1 diabetes mellitus on insulin therapy ([place])    Valvular heart disease    a. Mild MR/TR by TEE 08/2013.   Past Surgical History:  Procedure Laterality Date   ABLATION  09-23-2013   CTI by Dr Lovena Le   APPENDECTOMY     APPLICATION OF WOUND VAC Left 04/11/2017   Procedure: APPLICATION OF WOUND VAC LEFT KNEE;  Surgeon: Rod Can, MD;  Location: [facility];  Service: Orthopedics;  Laterality: Left;   ATRIAL FLUTTER ABLATION N/A 09/23/2013   Procedure: ATRIAL FLUTTER ABLATION;  Surgeon: Evans Lance, MD;  Location: [facility] CATH LAB;  Service: Cardiovascular;  Laterality: N/A;   CARDIOVERSION N/A 08/16/2013   Procedure: CARDIOVERSION;  Surgeon: Thayer Headings, MD;  Location: [facility];  Service: Cardiovascular;  Laterality: N/A;  spoke with Tom   CATARACT EXTRACTION Bilateral    COLONOSCOPY     CORONARY ANGIOPLASTY WITH STENT [place]   ORIF PATELLA Left 04/11/2017   ORIF PATELLA Left 04/11/2017   Procedure: OPEN REDUCTION INTERNAL (ORIF) FIXATION PATELLA LEFT KNEE;  Surgeon: Rod Can, MD;  Location: [facility];  Service: Orthopedics;  Laterality: Left;   PELVIC LAPAROSCOPY     TEE WITHOUT CARDIOVERSION N/A 08/16/2013   Procedure: TRANSESOPHAGEAL ECHOCARDIOGRAM (TEE);  Surgeon: Thayer Headings, MD;  Location: [facility] ENDOSCOPY;  Service: Cardiovascular;  Laterality: N/A;   Social History   Socioeconomic History   Marital status: Married    Spouse name: [name]   Number of children: 2   Years of education: 14   Highest education level: Not on file  Occupational History   Occupation: home maker    Comment: Children adopted  Tobacco Use   Smoking status: Never   Smokeless tobacco: Never  Vaping Use   Vaping Use: Never used  Substance and Sexual Activity   Alcohol use: Yes    Alcohol/week: 5.0 standard drinks of alcohol    Types: 5 Glasses of wine per week    Comment: on weekends   Drug use: No   Sexual activity: Yes    Birth control/protection: Post-menopausal  Other Topics Concern   Not on file  Social History Narrative   Not on file   Social Determinants of Health  Financial Resource Strain: Low Risk  (09/10/2022)   Overall Financial Resource Strain (CARDIA)    Difficulty of Paying Living Expenses: Not hard at all  Food Insecurity: No Food Insecurity (09/10/2022)   Hunger Vital Sign    Worried About Running Out of Food in the Last Year: Never true    Ran Out of Food in the Last Year: Never true  Transportation Needs: No Transportation Needs (09/10/2022)   PRAPARE - Hydrologist (Medical): No    Lack of Transportation (Non-Medical): No  Physical Activity: Sufficiently Active (09/10/2022)   Exercise Vital Sign    Days of Exercise per Week: 4 days    Minutes of Exercise per Session: 40 min  Stress: No Stress Concern Present (09/10/2022)   [place]    Feeling of Stress : Not at all  Social Connections: [place] (09/10/2022)   Social Connection and Isolation Panel [NHANES]    Frequency of Communication with Friends and Family: More than three times a week    Frequency of Social Gatherings with Friends and Family: More than three times a week    Attends Religious Services: More than 4 times per year    Active Member of [employer] or Organizations: Yes    Attends Music therapist: More than 4 times per year    Marital Status: Married   Allergies  Allergen Reactions   Sulfa Antibiotics Rash   Sulfamethoxazole-Trimethoprim Rash   Family History  Problem Relation Age of Onset   CAD Father 91       died age 53   CAD Brother 53       small vessel disease   Atrial fibrillation Mother    Diabetes Mother    Hypertension Mother    CAD Cousin        paternal   CAD Paternal Grandfather        early onset   Colon cancer Neg Hx     Current Outpatient Medications (Endocrine & Metabolic):    LYUMJEV [id number] UNIT/ML SOLN, USE A MAXIMUM OF 90 UNITS DAILY VIA INSULIN PUMP   metFORMIN (GLUCOPHAGE-XR) 500 MG 24 hr tablet, TAKE 1 TABLET IN THE MORNING AND 2 TABLETS IN THE EVENING AS DIRECTED   pioglitazone (ACTOS) 30 MG tablet, Take 1 tablet (30 mg total) by mouth daily.  Current Outpatient Medications (Cardiovascular):    ezetimibe (ZETIA) 10 MG tablet, TAKE 1 TABLET DAILY   losartan (COZAAR) 25 MG tablet, TAKE 1 TABLET DAILY   metoprolol succinate (TOPROL-XL) 100 MG 24 hr tablet, TAKE 1 TABLET DAILY (KEEP UPCOMING APPOINTMENT IN MARCH 2023 WITH DR. Acie Fredrickson BEFORE ANYMORE REFILLS)   nitroGLYCERIN (NITROSTAT) 0.4 MG SL tablet, DISSOLVE 1 TABLET UNDER THE TONGUE EVERY 5 MINUTES AS NEEDED FOR CHEST PAIN   rosuvastatin (CRESTOR) 40 MG tablet, Take 1 tablet (40 mg total) by mouth daily.   Current Outpatient Medications (Analgesics):    ASPIRIN 81 PO, Take by mouth.    ibuprofen (ADVIL) 200 MG tablet, Take 200 mg by mouth every 6 (six) hours as needed.  Current Outpatient Medications (Hematological):    clopidogrel (PLAVIX) 75 MG tablet, TAKE 1 TABLET DAILY (KEEP UPCOMING APPOINTMENT IN MARCH 2023 WITH DR. Acie Fredrickson BEFORE ANYMORE REFILLS)  Current Outpatient Medications (Other):    Calcium Carbonate-Vitamin D (CALCIUM + D PO), Take 600 mg by mouth 2 (two) times daily.   cephALEXin (KEFLEX) 500 MG capsule, Take 1 capsule (500  mg total) by mouth 2 (two) times daily for 5 days.   cholecalciferol (VITAMIN D) 1000 units tablet, Take 1,000 Units by mouth 2 (two) times daily.    Continuous Blood Gluc Sensor (DEXCOM G6 SENSOR) MISC, by Does not apply route.   gabapentin (NEURONTIN) 100 MG capsule, TAKE 2 CAPSULES(200 MG) BY MOUTH AT BEDTIME   Multiple Vitamins-Minerals (ICAPS AREDS 2 PO), Take 2 tablets by mouth daily.   solifenacin (VESICARE) 10 MG tablet, Take 1 tablet (10 mg total) by mouth daily.   TART CHERRY PO, Take by mouth.   Reviewed prior external information including notes and imaging from  primary care provider As well as notes that were available from care everywhere and other healthcare systems.  Past medical history, social, surgical and family history all reviewed in electronic medical record.  No pertanent information unless stated regarding to the chief complaint.   Review of Systems:  No headache, visual changes, nausea, vomiting, diarrhea, constipation, dizziness, abdominal pain, skin rash, fevers, chills, night sweats, weight loss, swollen lymph nodes, body aches, joint swelling, chest pain, shortness of breath, mood changes. POSITIVE muscle aches  Objective  There were no vitals taken for this visit.   General: No apparent distress alert and oriented x3 mood and affect normal, dressed appropriately.  HEENT: Pupils equal, extraocular movements intact  Respiratory: Patient's speak in full sentences and does not appear short of breath   Cardiovascular: No lower extremity edema, non tender, no erythema      Impression and Recommendations:

## 2022-11-11 ENCOUNTER — Telehealth: Payer: Medicare Other | Admitting: Emergency Medicine

## 2022-11-11 DIAGNOSIS — R300 Dysuria: Secondary | ICD-10-CM

## 2022-11-11 NOTE — Progress Notes (Signed)
Because you were recently treated for a UTI and then symptoms returned, I am not able to treat you by Evisit. You will need your urine tested again, and I cannot do that by Evisit. I recommend that you be seen for a face to face visit.  Please contact your primary care physician practice to be seen. Alternatively, you can try an in person urgent care.   NOTE: You will NOT be charged for this eVisit.  If you do not have a PCP, [facility] offers a free physician referral service available at [phone]. Our trained staff has the experience, knowledge and resources to put you in touch with a physician who is right for you.    If you are having a true medical emergency please call 911.   Your e-visit answers were reviewed by a board certified advanced clinical practitioner to complete your personal care plan.  Thank you for using e-Visits.     For an urgent face to face visit, [facility] has seven urgent care centers for your convenience:     [facility] [place] [facility] [place] Get Driving Directions [phone] [address] [place], [place] [address]    [facility]) Get Driving Directions [phone] [place], [place] [address]  [facility] [place] [facility]) Get Driving Directions [phone] [address] [place],  [place]  [address]  [facility] at [address] [place] Get Driving Directions  [phone] [id number] [place],  [place] [address]   [facility] Get Driving Directions [phone] [address], [address], [place] [address]   [facility] Get Driving Directions  [phone] [address], [place] [address]   [facility] [place] Get Driving Directions [phone] [address] [place], [place] [address]

## 2022-11-12 ENCOUNTER — Ambulatory Visit (INDEPENDENT_AMBULATORY_CARE_PROVIDER_SITE_OTHER): Payer: Medicare Other | Admitting: Emergency Medicine

## 2022-11-12 ENCOUNTER — Encounter: Payer: Self-pay | Admitting: Emergency Medicine

## 2022-11-12 VITALS — BP 132/74 | HR 69 | Temp 97.7°F | Ht 69.0 in | Wt 217.1 lb

## 2022-11-12 DIAGNOSIS — R300 Dysuria: Secondary | ICD-10-CM | POA: Diagnosis not present

## 2022-11-12 DIAGNOSIS — N390 Urinary tract infection, site not specified: Secondary | ICD-10-CM | POA: Diagnosis not present

## 2022-11-12 DIAGNOSIS — R399 Unspecified symptoms and signs involving the genitourinary system: Secondary | ICD-10-CM | POA: Diagnosis not present

## 2022-11-12 LAB — POCT URINALYSIS DIPSTICK
Bilirubin, UA: NEGATIVE
Glucose, UA: NEGATIVE
Ketones, UA: NEGATIVE
Nitrite, UA: NEGATIVE
Protein, UA: NEGATIVE
Spec Grav, UA: 1.02 (ref 1.010–1.025)
Urobilinogen, UA: 0.2 E.U./dL
pH, UA: 6 (ref 5.0–8.0)

## 2022-11-12 MED ORDER — CIPROFLOXACIN HCL 500 MG PO TABS: 500.0000 mg | ORAL_TABLET | Freq: Two times a day (BID) | ORAL | 0 refills | Status: AC

## 2022-11-12 NOTE — Assessment & Plan Note (Signed)
Partially treated recent infection.  Clinically stable.  No red flag signs or symptoms.  Benign physical examination and stable vital signs. Urine culture not available. Urine collected today and sent for urine culture. Positive urinalysis today. Recommend to start Cipro 500 mg twice a day for 7 days. ED precautions given.

## 2022-11-12 NOTE — Patient Instructions (Signed)
Urinary Tract Infection, Adult A urinary tract infection (UTI) is an infection of any part of the urinary tract. The urinary tract includes: The kidneys. The ureters. The bladder. The urethra. These organs make, store, and get rid of pee (urine) in the body. What are the causes? This infection is caused by germs (bacteria) in your genital area. These germs grow and cause swelling (inflammation) of your urinary tract. What increases the risk? The following factors may make you more likely to develop this condition: Using a small, thin tube (catheter) to drain pee. Not being able to control when you pee or poop (incontinence). Being female. If you are female, these things can increase the risk: Using these methods to prevent pregnancy: A medicine that kills sperm (spermicide). A device that blocks sperm (diaphragm). Having low levels of a female hormone (estrogen). Being pregnant. You are more likely to develop this condition if: You have genes that add to your risk. You are sexually active. You take antibiotic medicines. You have trouble peeing because of: A prostate that is bigger than normal, if you are female. A blockage in the part of your body that drains pee from the bladder. A kidney stone. A nerve condition that affects your bladder. Not getting enough to drink. Not peeing often enough. You have other conditions, such as: Diabetes. A weak disease-fighting system (immune system). Sickle cell disease. Gout. Injury of the spine. What are the signs or symptoms? Symptoms of this condition include: Needing to pee right away. Peeing small amounts often. Pain or burning when peeing. Blood in the pee. Pee that smells bad or not like normal. Trouble peeing. Pee that is cloudy. Fluid coming from the vagina, if you are female. Pain in the belly or lower back. Other symptoms include: Vomiting. Not feeling hungry. Feeling mixed up (confused). This may be the first symptom in  older adults. Being tired and grouchy (irritable). A fever. Watery poop (diarrhea). How is this treated? Taking antibiotic medicine. Taking other medicines. Drinking enough water. In some cases, you may need to see a specialist. Follow these instructions at home:  Medicines Take over-the-counter and prescription medicines only as told by your doctor. If you were prescribed an antibiotic medicine, take it as told by your doctor. Do not stop taking it even if you start to feel better. General instructions Make sure you: Pee until your bladder is empty. Do not hold pee for a long time. Empty your bladder after sex. Wipe from front to back after peeing or pooping if you are a female. Use each tissue one time when you wipe. Drink enough fluid to keep your pee pale yellow. Keep all follow-up visits. Contact a doctor if: You do not get better after 1-2 days. Your symptoms go away and then come back. Get help right away if: You have very bad back pain. You have very bad pain in your lower belly. You have a fever. You have chills. You feeling like you will vomit or you vomit. Summary A urinary tract infection (UTI) is an infection of any part of the urinary tract. This condition is caused by germs in your genital area. There are many risk factors for a UTI. Treatment includes antibiotic medicines. Drink enough fluid to keep your pee pale yellow. This information is not intended to replace advice given to you by your health care provider. Make sure you discuss any questions you have with your health care provider. Document Revised: 06/02/2020 Document Reviewed: 06/02/2020 Elsevier Patient Education    2023 [employer]  

## 2022-11-12 NOTE — Progress Notes (Signed)
[name] 77 y.o.   Chief Complaint  Patient presents with   Follow-up    F/u UTI, patient was seen 12/29 for UTI given ABX, patient still having symptoms.  Patient is complaining about a cough was taking ABX in November,     HISTORY OF PRESENT ILLNESS: This is a 77 y.o. female complaining of burning and frequency on urination for the past several days Seen by PCP on 11/01/2022 with UTI symptoms.  Positive urinalysis.  Started on Keflex twice a day for 5 days.  Symptoms partially improved but have returned over the past several days.  Denies fever or chills.  Denies nausea or vomiting.  Denies gross hematuria or flank pain. No other complaints or medical concerns today. Also had respiratory illness last November and still has lingering cough.  Overall however much improved.  HPI   Prior to Admission medications   Medication Sig Start Date End Date Taking? Authorizing Provider  ASPIRIN 81 PO Take by mouth.   Yes [provider]  Calcium Carbonate-Vitamin D (CALCIUM + D PO) Take 600 mg by mouth 2 (two) times daily.   Yes [provider]  cholecalciferol (VITAMIN D) 1000 units tablet Take 1,000 Units by mouth 2 (two) times daily.    Yes [provider]  ciprofloxacin (CIPRO) 500 MG tablet Take 1 tablet (500 mg total) by mouth 2 (two) times daily for 7 days. 11/12/22 11/19/22 Yes Mackynzie Woolford, Ines Bloomer, MD  clopidogrel (PLAVIX) 75 MG tablet TAKE 1 TABLET DAILY (KEEP UPCOMING APPOINTMENT IN MARCH 2023 WITH DR. Acie Fredrickson BEFORE ANYMORE REFILLS) 05/09/22  Yes [name], Wonda Cheng, MD  Continuous Blood Gluc Sensor (DEXCOM G6 SENSOR) MISC by Does not apply route.   Yes [provider]  ezetimibe (ZETIA) 10 MG tablet TAKE 1 TABLET DAILY 06/28/22  Yes Elayne Snare, MD  gabapentin (NEURONTIN) 100 MG capsule TAKE 2 CAPSULES(200 MG) BY MOUTH AT BEDTIME 07/23/22  Yes Hulan Saas M, DO  ibuprofen (ADVIL) 200 MG tablet Take 200 mg by mouth every 6 (six) hours as needed.    Yes [provider]  losartan (COZAAR) 25 MG tablet TAKE 1 TABLET DAILY 09/20/22  Yes Elayne Snare, MD  LYUMJEV 100 UNIT/ML SOLN USE A MAXIMUM OF 90 UNITS DAILY VIA INSULIN PUMP 05/27/22  Yes Elayne Snare, MD  metFORMIN (GLUCOPHAGE-XR) 500 MG 24 hr tablet TAKE 1 TABLET IN THE MORNING AND 2 TABLETS IN THE EVENING AS DIRECTED 12/24/21  Yes Elayne Snare, MD  metoprolol succinate (TOPROL-XL) 100 MG 24 hr tablet TAKE 1 TABLET DAILY (KEEP UPCOMING APPOINTMENT IN MARCH 2023 WITH DR. Acie Fredrickson BEFORE ANYMORE REFILLS) 03/26/22  Yes [name], Wonda Cheng, MD  nitroGLYCERIN (NITROSTAT) 0.4 MG SL tablet DISSOLVE 1 TABLET UNDER THE TONGUE EVERY 5 MINUTES AS NEEDED FOR CHEST PAIN 01/23/22  Yes [name], Wonda Cheng, MD  pioglitazone (ACTOS) 30 MG tablet Take 1 tablet (30 mg total) by mouth daily. 12/24/21  Yes Elayne Snare, MD  rosuvastatin (CRESTOR) 40 MG tablet Take 1 tablet (40 mg total) by mouth daily. 12/24/21  Yes Elayne Snare, MD  solifenacin (VESICARE) 10 MG tablet Take 1 tablet (10 mg total) by mouth daily. 09/25/22  Yes Hoyt Koch, MD  TART CHERRY PO Take by mouth.   Yes [provider]    Allergies  Allergen Reactions   Sulfa Antibiotics Rash   Sulfamethoxazole-Trimethoprim Rash    Patient Active Problem List   Diagnosis Date Noted   Subacute cough 09/25/2022   Left buttock abscess  07/31/2022   Low back pain 01/17/2022   Arthritis of carpometacarpal [facility]) joint of right thumb 01/17/2022   Dysuria 01/08/2022   Bilateral hearing loss 01/08/2022   Acute cystitis 12/19/2020   Hypertension 05/13/2019   Urinary incontinence 01/19/2019   Valvular heart disease    Insulin pump in place    Fibroid    Osteoarthritis    Displaced comminuted fracture of left patella, initial encounter for closed fracture 04/11/2017   Routine general medical examination at a health care facility 11/08/2015   Hyperlipidemia associated with type 2 diabetes mellitus ([place]) 03/16/2015   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 s/p TEE/DCCV 08/16/13 (normal LV function, no LAA thrombus).b. s/p ablation by Dr Lovena Le 09-23-2013   Coronary artery disease    2009 LAD Promus stent   Dysrhythmia    Aflutter, no issues after ablation 2014   Fibroid    Hyperlipidemia    Insulin pump in place    Osteopenia    Type 1 diabetes mellitus on insulin therapy ([place])    Valvular heart disease    a. Mild MR/TR by TEE 08/2013.    Past Surgical History:  Procedure Laterality Date   ABLATION  09-23-2013   CTI by Dr Lovena Le   APPENDECTOMY     APPLICATION OF WOUND VAC Left 04/11/2017   Procedure: APPLICATION OF WOUND VAC LEFT KNEE;  Surgeon: Rod Can, MD;  Location: [place];  Service: Orthopedics;  Laterality: Left;   ATRIAL FLUTTER ABLATION N/A 09/23/2013   Procedure: ATRIAL FLUTTER ABLATION;  Surgeon: Evans Lance, MD;  Location: [facility] CATH LAB;  Service: Cardiovascular;  Laterality: N/A;   CARDIOVERSION N/A 08/16/2013   Procedure: CARDIOVERSION;  Surgeon: Thayer Headings, MD;  Location: [place];  Service: Cardiovascular;  Laterality: N/A;  spoke with Tom   CATARACT EXTRACTION Bilateral    COLONOSCOPY     CORONARY ANGIOPLASTY WITH STENT [place]   ORIF PATELLA Left 04/11/2017   ORIF PATELLA Left 04/11/2017   Procedure: OPEN REDUCTION INTERNAL (ORIF) FIXATION PATELLA LEFT KNEE;  Surgeon: Rod Can, MD;  Location: [place];  Service: Orthopedics;  Laterality: Left;   PELVIC LAPAROSCOPY     TEE WITHOUT CARDIOVERSION N/A 08/16/2013   Procedure: TRANSESOPHAGEAL ECHOCARDIOGRAM (TEE);  Surgeon: Thayer Headings, MD;  Location: [facility] ENDOSCOPY;  Service: Cardiovascular;  Laterality: N/A;    Social  History   Socioeconomic History   Marital status: Married    Spouse name: [name]   Number of children: 2   Years of education: 14   Highest education level: Not on file  Occupational History   Occupation: home maker    Comment: Children adopted  Tobacco Use   Smoking status: Never   Smokeless tobacco: Never  Vaping Use   Vaping Use: Never used  Substance and Sexual Activity   Alcohol use: Yes    Alcohol/week: 5.0 standard drinks of alcohol    Types: 5 Glasses of wine per week    Comment: on weekends   Drug use: No   Sexual activity: Yes    Birth control/protection: Post-menopausal  Other Topics Concern   Not on  file  Social History Narrative   Not on file   Social Determinants of Health   Financial Resource Strain: Low Risk  (09/10/2022)   Overall Financial Resource Strain (CARDIA)    Difficulty of Paying Living Expenses: Not hard at all  Food Insecurity: No Food Insecurity (09/10/2022)   Hunger Vital Sign    Worried About Running Out of Food in the Last Year: Never true    Ran Out of Food in the Last Year: Never true  Transportation Needs: No Transportation Needs (09/10/2022)   PRAPARE - Hydrologist (Medical): No    Lack of Transportation (Non-Medical): No  Physical Activity: Sufficiently Active (09/10/2022)   Exercise Vital Sign    Days of Exercise per Week: 4 days    Minutes of Exercise per Session: 40 min  Stress: No Stress Concern Present (09/10/2022)   [place]    Feeling of Stress : Not at all  Social Connections: [place] (09/10/2022)   Social Connection and Isolation Panel [NHANES]    Frequency of Communication with Friends and Family: More than three times a week    Frequency of Social Gatherings with Friends and Family: More than three times a week    Attends Religious Services: More than 4 times per year    Active Member of [employer] or Organizations: Yes     Attends Music therapist: More than 4 times per year    Marital Status: Married  Human resources officer Violence: Not At Risk (09/10/2022)   Humiliation, Afraid, Rape, and Kick questionnaire    Fear of Current or Ex-Partner: No    Emotionally Abused: No    Physically Abused: No    Sexually Abused: No    Family History  Problem Relation Age of Onset   CAD Father 26       died age 59   CAD Brother 25       small vessel disease   Atrial fibrillation Mother    Diabetes Mother    Hypertension Mother    CAD Cousin        paternal   CAD Paternal Grandfather        early onset   Colon cancer Neg Hx      Review of Systems  Constitutional: Negative.  Negative for chills and fever.  HENT: Negative.  Negative for congestion and sore throat.   Respiratory:  Positive for cough.   Cardiovascular: Negative.  Negative for chest pain and palpitations.  Gastrointestinal:  Negative for abdominal pain, diarrhea, nausea and vomiting.  Genitourinary:  Positive for dysuria and frequency. Negative for flank pain and hematuria.  Skin: Negative.  Negative for rash.  Neurological: Negative.  Negative for dizziness and headaches.  All other systems reviewed and are negative.   Today's Vitals   11/12/22 1056  BP: 132/74  Pulse: 69  Temp: 97.7 F (36.5 C)  TempSrc: Oral  SpO2: 95%  Weight: 217 lb 2 oz (98.5 kg)  Height: '5\' 9"'$  (1.753 m)   Body mass index is 32.06 kg/m.  Physical Exam Vitals reviewed.  Constitutional:      Appearance: Normal appearance.  HENT:     Head: Normocephalic.  Eyes:     Extraocular Movements: Extraocular movements intact.     Pupils: Pupils are equal, round, and reactive to light.  Cardiovascular:     Rate and Rhythm: Normal rate and regular rhythm.     Pulses: Normal  pulses.     Heart sounds: Normal heart sounds.  Pulmonary:     Effort: Pulmonary effort is normal.     Breath sounds: Normal breath sounds.  Abdominal:     Palpations: Abdomen is  soft.     Tenderness: There is no abdominal tenderness. There is no right CVA tenderness or left CVA tenderness.  Musculoskeletal:     Cervical back: No tenderness.  Lymphadenopathy:     Cervical: No cervical adenopathy.  Skin:    General: Skin is warm and dry.  Neurological:     General: No focal deficit present.     Mental Status: She is alert and oriented to person, place, and time.  Psychiatric:        Mood and Affect: Mood normal.        Behavior: Behavior normal.    Results for orders placed or performed in visit on 11/12/22 (from the past 24 hour(s))  POCT Urinalysis Dipstick     Status: Abnormal   Collection Time: 11/12/22 11:31 AM  Result Value Ref Range   Color, UA yellow    Clarity, UA cloudy    Glucose, UA Negative Negative   Bilirubin, UA negative    Ketones, UA negative    Spec Grav, UA 1.020 1.010 - 1.025   Blood, UA trace    pH, UA 6.0 5.0 - 8.0   Protein, UA Negative Negative   Urobilinogen, UA 0.2 0.2 or 1.0 E.U./dL   Nitrite, UA negative    Leukocytes, UA Moderate (2+) (A) Negative   Appearance     Odor       ASSESSMENT & PLAN: A total of 33 minutes was spent with the patient and counseling/coordination of care regarding preparing for this visit, review of most recent office visit notes, review of chronic medical conditions under management, review of all medications, diagnosis of acute UTI and need to treat with antibiotics, prognosis, documentation, need for follow-up.  Problem List Items Addressed This Visit       Genitourinary   Acute UTI - Primary    Partially treated recent infection.  Clinically stable.  No red flag signs or symptoms.  Benign physical examination and stable vital signs. Urine culture not available. Urine collected today and sent for urine culture. Positive urinalysis today. Recommend to start Cipro 500 mg twice a day for 7 days. ED precautions given.      Relevant Medications   ciprofloxacin (CIPRO) 500 MG tablet      Other   Dysuria   Relevant Orders   POCT Urinalysis Dipstick (Completed)   Urine Culture   Patient Instructions  Urinary Tract Infection, Adult A urinary tract infection (UTI) is an infection of any part of the urinary tract. The urinary tract includes: The kidneys. The ureters. The bladder. The urethra. These organs make, store, and get rid of pee (urine) in the body. What are the causes? This infection is caused by germs (bacteria) in your genital area. These germs grow and cause swelling (inflammation) of your urinary tract. What increases the risk? The following factors may make you more likely to develop this condition: Using a small, thin tube (catheter) to drain pee. Not being able to control when you pee or poop (incontinence). Being female. If you are female, these things can increase the risk: Using these methods to prevent pregnancy: A medicine that kills sperm (spermicide). A device that blocks sperm (diaphragm). Having low levels of a female hormone (estrogen). Being pregnant. You are more  likely to develop this condition if: You have genes that add to your risk. You are sexually active. You take antibiotic medicines. You have trouble peeing because of: A prostate that is bigger than normal, if you are female. A blockage in the part of your body that drains pee from the bladder. A kidney stone. A nerve condition that affects your bladder. Not getting enough to drink. Not peeing often enough. You have other conditions, such as: Diabetes. A weak disease-fighting system (immune system). Sickle cell disease. Gout. Injury of the spine. What are the signs or symptoms? Symptoms of this condition include: Needing to pee right away. Peeing small amounts often. Pain or burning when peeing. Blood in the pee. Pee that smells bad or not like normal. Trouble peeing. Pee that is cloudy. Fluid coming from the vagina, if you are female. Pain in the belly or lower  back. Other symptoms include: Vomiting. Not feeling hungry. Feeling mixed up (confused). This may be the first symptom in older adults. Being tired and grouchy (irritable). A fever. Watery poop (diarrhea). How is this treated? Taking antibiotic medicine. Taking other medicines. Drinking enough water. In some cases, you may need to see a specialist. Follow these instructions at home:  Medicines Take over-the-counter and prescription medicines only as told by your doctor. If you were prescribed an antibiotic medicine, take it as told by your doctor. Do not stop taking it even if you start to feel better. General instructions Make sure you: Pee until your bladder is empty. Do not hold pee for a long time. Empty your bladder after sex. Wipe from front to back after peeing or pooping if you are a female. Use each tissue one time when you wipe. Drink enough fluid to keep your pee pale yellow. Keep all follow-up visits. Contact a doctor if: You do not get better after 1-2 days. Your symptoms go away and then come back. Get help right away if: You have very bad back pain. You have very bad pain in your lower belly. You have a fever. You have chills. You feeling like you will vomit or you vomit. Summary A urinary tract infection (UTI) is an infection of any part of the urinary tract. This condition is caused by germs in your genital area. There are many risk factors for a UTI. Treatment includes antibiotic medicines. Drink enough fluid to keep your pee pale yellow. This information is not intended to replace advice given to you by your health care provider. Make sure you discuss any questions you have with your health care provider. Document Revised: 06/02/2020 Document Reviewed: 06/02/2020 Elsevier Patient Education  [place], MD [facility]

## 2022-11-13 ENCOUNTER — Ambulatory Visit: Payer: Medicare Other | Admitting: Family Medicine

## 2022-11-14 LAB — URINE CULTURE

## 2022-11-15 ENCOUNTER — Other Ambulatory Visit: Payer: Self-pay | Admitting: Endocrinology

## 2022-11-15 DIAGNOSIS — E1065 Type 1 diabetes mellitus with hyperglycemia: Secondary | ICD-10-CM

## 2022-11-19 ENCOUNTER — Other Ambulatory Visit (INDEPENDENT_AMBULATORY_CARE_PROVIDER_SITE_OTHER): Payer: Medicare Other

## 2022-11-19 DIAGNOSIS — E1065 Type 1 diabetes mellitus with hyperglycemia: Secondary | ICD-10-CM | POA: Diagnosis not present

## 2022-11-19 LAB — COMPREHENSIVE METABOLIC PANEL
ALT: 38 U/L — ABNORMAL HIGH (ref 0–35)
AST: 26 U/L (ref 0–37)
Albumin: 4 g/dL (ref 3.5–5.2)
Alkaline Phosphatase: 94 U/L (ref 39–117)
BUN: 12 mg/dL (ref 6–23)
CO2: 31 mEq/L (ref 19–32)
Calcium: 9.1 mg/dL (ref 8.4–10.5)
Chloride: 102 mEq/L (ref 96–112)
Creatinine, Ser: 0.74 mg/dL (ref 0.40–1.20)
GFR: 79.42 mL/min (ref >60.00)
Glucose, Bld: 111 mg/dL — ABNORMAL HIGH (ref 70–99)
Potassium: 4 mEq/L (ref 3.5–5.1)
Sodium: 141 mEq/L (ref 135–145)
Total Bilirubin: 0.4 mg/dL (ref 0.2–1.2)
Total Protein: 6.7 g/dL (ref 6.0–8.3)

## 2022-11-19 LAB — HEMOGLOBIN A1C: Hgb A1c MFr Bld: 7.3 % — ABNORMAL HIGH (ref 4.6–6.5)

## 2022-11-21 ENCOUNTER — Encounter: Payer: Self-pay | Admitting: Endocrinology

## 2022-11-21 ENCOUNTER — Ambulatory Visit (INDEPENDENT_AMBULATORY_CARE_PROVIDER_SITE_OTHER): Payer: Medicare Other | Admitting: Endocrinology

## 2022-11-21 VITALS — BP 138/60 | HR 75 | Ht 69.0 in | Wt 220.0 lb

## 2022-11-21 DIAGNOSIS — E782 Mixed hyperlipidemia: Secondary | ICD-10-CM

## 2022-11-21 DIAGNOSIS — E1065 Type 1 diabetes mellitus with hyperglycemia: Secondary | ICD-10-CM

## 2022-11-21 DIAGNOSIS — R748 Abnormal levels of other serum enzymes: Secondary | ICD-10-CM

## 2022-11-21 NOTE — Progress Notes (Signed)
Patient ID: [name], female   DOB: June 21, 1946, 77 y.o.   MRN: [id number]   Reason for visit: DIABETES followup.  Diagnosis: Type 1 diabetes mellitus, date of diagnosis: 1979.    HISTORY of present illness:   Insulin Pump: CURRENT brand: T-Slim since 08/2020  BASAL settings:  Midnight = 0.5, 3:30 AM = 1.4, 6 AM = 2.25, 10 AM = 0.75, 1 PM = 1.6, 330 PM = 1.9, 4:30 PM = 1.8,  8 PM = 3.2 and 10 PM = 1.8.    Total daily insulin = usually about 60 units, maximum 90, using Lyumjev   Carbohydrate ratio 1: 12 breakfast, 1: 5 lunch and 1: 4.5 at dinner; sensitivity 1: 25 from 12 AM- 10 AM, 1: 30 from 10-11 AM, 1-4:30 PM = 1: 20 Target 125  Insulin on board indicator on, duration 4 hrs   DIABETES: She has had long-standing diabetes with A1c levels usually above target. In 08/2012 pioglitazone was restarted because of her large insulin requirement and diabetic dyslipidemia.  Her blood sugars improved significantly with this.  Also has been taking metformin as insulin sensitizer  RECENT history:   Her A1c is relatively higher at 7.3, was at 6.6  Current diabetes management, blood sugar patterns from sensor and pump download:  Recent total daily insulin usage = 62 units  Insulin pump management management: Blood sugars recently are reasonably good with time in range 78%, previous time in range 82% Not clear why her A1c has gone up She is recently likely being more attention to her diabetes management but previously had been more occupied with family issues She thinks she is generally trying to bolus before starting to eat but occasionally may be late As usual occasionally when eating out her blood sugars go substantially higher in the evening However now her postprandial readings after DINNER generally appear to be somewhat lower than Premeal readings Overall hypoglycemia has been minimized recently with only 1 episode of hypoglycemia after lunch bolus Not always doing  manual correction when blood sugars are staying higher postprandially  Overnight blood sugars are trending higher after about 2 AM through 6 AM She is generally using the sleep mode at night but does not appear to be turning this on consistently She was told to leave off Actos but she still appears to be getting her prescription filled as of this month  Her blood sugar analysis from the [facility] data for the last 2 weeks is interpreted as below  OVERNIGHT blood sugars are showing mild variability with overall trending higher after about 2 AM through 6 AM including some episodes of hyperglycemia  No hypoglycemia overnight PRE-meal blood sugars are fairly close to target at mealtimes and occasionally low normal before dinner POSTPRANDIAL readings are on an average not rising excessively except occasionally after lunch or dinner Generally postprandial readings after dinner recently due to the lower than Premeal readings for the last few days No other episodes of unusual hyperglycemia Hypoglycemia has been rare with 1 episode in the late afternoon and occasionally low normal before dinner also Time in range is slightly low for the last time   Her CGM shows the following data for the last 2 weeks:    CGM use % of time   2-week average/SD 147/40  Time in range 78  % Time Above 180 21  % Time above 250   % Time Below 70 1      PRE-MEAL  overnight  mornings  afternoon  evening Overall  Glucose range:       Averages: 154 130 158 147      Previously:   CGM use % of time   2-week average/SD 144  Time in range 82  % Time Above 180 17  % Time above 250   % Time Below 70 2      PRE-MEAL  overnight  mornings  afternoon  evening Overall  Glucose range:       Averages: 143 123 149 152+/-54     Hypoglycemic awareness: Has symptoms of feeling tired, headache, sweaty.  She thinks he can usually recognize symptoms  Sometimes may not be aware during the night and will be notified by her  sensor that the sugar is low  She has symptoms when blood glucose is less than 60. Uses glucose tablets for treatment even overnight     DIET has been usually fairly controlled with carbohydrate and fat intake, tends to eat out on weekends The mealtimes are variable.    Food preferences: Yogurt and cottage cheese for breakfast usually.   Blood sugars with exercise: No significant problems recently with adequate control and no hypoglycemia  .  Wt Readings from Last 3 Encounters:  11/21/22 220 lb (99.8 kg)  11/12/22 217 lb 2 oz (98.5 kg)  11/01/22 220 lb (99.8 kg)    LABS:  Lab Results  Component Value Date   HGBA1C 7.3 (H) 11/19/2022   HGBA1C 6.6 (H) 07/11/2022   HGBA1C 6.5 03/19/2022   Lab Results  Component Value Date   MICROALBUR <0.7 07/11/2022   LDLCALC 45 07/11/2022   CREATININE 0.74 11/19/2022    Lab on 11/19/2022  Component Date Value Ref Range Status   Hgb A1c MFr Bld 11/19/2022 7.3 (H)  4.6 - 6.5 % Final   Glycemic Control Guidelines for People with Diabetes:Non Diabetic:  <6%Goal of Therapy: <7%Additional Action Suggested:  >8%    Sodium 11/19/2022 141  135 - 145 mEq/L Final   Potassium 11/19/2022 4.0  3.5 - 5.1 mEq/L Final   Chloride 11/19/2022 102  96 - 112 mEq/L Final   CO2 11/19/2022 31  19 - 32 mEq/L Final   Glucose, Bld 11/19/2022 111 (H)  70 - 99 mg/dL Final   BUN 11/19/2022 12  6 - 23 mg/dL Final   Creatinine, Ser 11/19/2022 0.74  0.40 - 1.20 mg/dL Final   Total Bilirubin 11/19/2022 0.4  0.2 - 1.2 mg/dL Final   Alkaline Phosphatase 11/19/2022 94  39 - 117 U/L Final   AST 11/19/2022 26  0 - 37 U/L Final   ALT 11/19/2022 38 (H)  0 - 35 U/L Final   Total Protein 11/19/2022 6.7  6.0 - 8.3 g/dL Final   Albumin 11/19/2022 4.0  3.5 - 5.2 g/dL Final   GFR 11/19/2022 79.42  >60.00 mL/min Final   Calculated using the CKD-EPI Creatinine Equation (2021)   Calcium 11/19/2022 9.1  8.4 - 10.5 mg/dL Final     Allergies as of 11/21/2022       Reactions    Sulfa Antibiotics Rash   Sulfamethoxazole-trimethoprim Rash        Medication List        Accurate as of November 21, 2022 10:49 AM. If you have any questions, ask your nurse or doctor.          ASPIRIN 81 PO Take by mouth.   CALCIUM + D PO Take 600 mg by mouth 2 (two) times daily.  cholecalciferol 1000 units tablet Commonly known as: VITAMIN D Take 1,000 Units by mouth 2 (two) times daily.   clopidogrel 75 MG tablet Commonly known as: PLAVIX TAKE 1 TABLET DAILY (KEEP UPCOMING APPOINTMENT IN MARCH 2023 WITH DR. Acie Fredrickson BEFORE ANYMORE REFILLS)   Dexcom G6 Sensor Misc by Does not apply route.   ezetimibe 10 MG tablet Commonly known as: ZETIA TAKE 1 TABLET DAILY   gabapentin 100 MG capsule Commonly known as: NEURONTIN TAKE 2 CAPSULES(200 MG) BY MOUTH AT BEDTIME   ibuprofen 200 MG tablet Commonly known as: ADVIL Take 200 mg by mouth every 6 (six) hours as needed.   losartan 25 MG tablet Commonly known as: COZAAR TAKE 1 TABLET DAILY   Lyumjev 100 UNIT/ML Soln Generic drug: Insulin Lispro-aabc USE A MAXIMUM OF 90 UNITS DAILY VIA INSULIN PUMP   metFORMIN 500 MG 24 hr tablet Commonly known as: GLUCOPHAGE-XR TAKE 1 TABLET IN THE MORNING AND 2 TABLETS IN THE EVENING AS DIRECTED   metoprolol succinate 100 MG 24 hr tablet Commonly known as: TOPROL-XL TAKE 1 TABLET DAILY (KEEP UPCOMING APPOINTMENT IN MARCH 2023 WITH DR. Acie Fredrickson BEFORE ANYMORE REFILLS)   nitroGLYCERIN 0.4 MG SL tablet Commonly known as: NITROSTAT DISSOLVE 1 TABLET UNDER THE TONGUE EVERY 5 MINUTES AS NEEDED FOR CHEST PAIN   pioglitazone 30 MG tablet Commonly known as: Actos Take 1 tablet (30 mg total) by mouth daily.   rosuvastatin 40 MG tablet Commonly known as: CRESTOR Take 1 tablet (40 mg total) by mouth daily.   solifenacin 10 MG tablet Commonly known as: VESICARE Take 1 tablet (10 mg total) by mouth daily.   TART CHERRY PO Take by mouth.        Allergies:  Allergies  Allergen  Reactions   Sulfa Antibiotics Rash   Sulfamethoxazole-Trimethoprim Rash    Past Medical History:  Diagnosis Date   Arthritis    OA   Atrial flutter ([place])    a. s/p TEE/DCCV 08/16/13 (normal LV function, no LAA thrombus).b. s/p ablation by Dr Lovena Le 09-23-2013   Coronary artery disease    2009 LAD Promus stent   Dysrhythmia    Aflutter, no issues after ablation 2014   Fibroid    Hyperlipidemia    Insulin pump in place    Osteopenia    Type 1 diabetes mellitus on insulin therapy ([place])    Valvular heart disease    a. Mild MR/TR by TEE 08/2013.    Past Surgical History:  Procedure Laterality Date   ABLATION  09-23-2013   CTI by Dr Lovena Le   APPENDECTOMY     APPLICATION OF WOUND VAC Left 04/11/2017   Procedure: APPLICATION OF WOUND VAC LEFT KNEE;  Surgeon: Rod Can, MD;  Location: [place];  Service: Orthopedics;  Laterality: Left;   ATRIAL FLUTTER ABLATION N/A 09/23/2013   Procedure: ATRIAL FLUTTER ABLATION;  Surgeon: Evans Lance, MD;  [facility] LAB;  Service: Cardiovascular;  Laterality: N/A;   CARDIOVERSION N/A 08/16/2013   Procedure: CARDIOVERSION;  Surgeon: Thayer Headings, MD;  Location: [place];  Service: Cardiovascular;  Laterality: N/A;  spoke with Tom   CATARACT EXTRACTION Bilateral    COLONOSCOPY     CORONARY ANGIOPLASTY WITH STENT [place]   ORIF PATELLA Left 04/11/2017   ORIF PATELLA Left 04/11/2017   Procedure: OPEN REDUCTION INTERNAL (ORIF) FIXATION PATELLA LEFT KNEE;  Surgeon: Rod Can, MD;  Location: [place];  Service: Orthopedics;  Laterality: Left;  PELVIC LAPAROSCOPY     TEE WITHOUT CARDIOVERSION N/A 08/16/2013   Procedure: TRANSESOPHAGEAL ECHOCARDIOGRAM (TEE);  Surgeon: Thayer Headings, MD;  Location: [facility] ENDOSCOPY;  Service: Cardiovascular;  Laterality: N/A;    Family History  Problem Relation Age of Onset   CAD Father 26       died age 75   CAD Brother 49       small vessel disease   Atrial fibrillation  Mother    Diabetes Mother    Hypertension Mother    CAD Cousin        paternal   CAD Paternal Grandfather        early onset   Colon cancer Neg Hx     Social History:  reports that she has never smoked. She has never used smokeless tobacco. She reports current alcohol use of about 5.0 standard drinks of alcohol per week. She reports that she does not use drugs.  REVIEW of systems:   Cardiac function: Her echo shows only grade 1 diastolic dysfunction and normal systolic function  She has had atrial arrhythmias treated with metoprolol  Blood pressure consistently normal, on 25 mg losartan from cardiologist for preventive reasons   BP Readings from Last 3 Encounters:  11/21/22 138/60  11/12/22 132/74  11/01/22 (!) 140/70     She has had diabetic dyslipidemia with significantly high LDL particle number This has been well controlled with Crestor 40 mg and Niaspan 1000 mg Her last LDL particle number was down to below 900 previously  Niacin was stopped because of tending to get flushing and itching which may be late at night, not generally prevented by aspirin, also regimen was difficult to do at night  She has been consistent with generic Crestor 40 and Zetia without side effects  Zetia added in 3/19  Labs as follows:  Lab Results  Component Value Date   CHOL 105 07/11/2022   CHOL 104 10/11/2021   CHOL 104 02/28/2021   Lab Results  Component Value Date   HDL 43.00 07/11/2022   HDL 46.50 10/11/2021   HDL 47.10 02/28/2021   Lab Results  Component Value Date   LDLCALC 45 07/11/2022   LDLCALC 43 10/11/2021   LDLCALC 39 02/28/2021   Lab Results  Component Value Date   TRIG 82.0 07/11/2022   TRIG 76.0 10/11/2021   TRIG 89.0 02/28/2021   Lab Results  Component Value Date   CHOLHDL 2 07/11/2022   CHOLHDL 2 10/11/2021   CHOLHDL 2 02/28/2021   Lab Results  Component Value Date   LDLDIRECT 79.0 01/11/2015   LDLDIRECT 73.1 10/13/2014     She has had  consistently normal thyroid levels  Lab Results  Component Value Date   TSH 2.33 10/11/2021   TSH 2.23 10/06/2019   TSH 1.86 01/19/2019    No retinopathy on last exam   Not clear why her ALT is slightly higher, takes Advil only occasionally  Lab Results  Component Value Date   ALT 38 (H) 11/19/2022     BP 138/60   Pulse 75   Ht '5\' 9"'$  (1.753 m)   Wt 220 lb (99.8 kg)   SpO2 96%   BMI 32.49 kg/m      ASSESSMENT/PLAN:  DIABETES type 1 on T-slim insulin pump  Her A1c is stable at 7.3 and slightly higher   See history of present illness for detailed discussion of her current blood sugar patterns seen on the CGM and management with insulin pump  Time in range is reasonably good at 78% There is some variability in postprandial readings but not consistently and possibly even when she is eating out a lot adding extra for higher fat intake Average blood sugar at any given segment of the day is highest 158 in the afternoon Hypoglycemia also infrequent  Current patterns and day-to-day management was discussed  Recommendations:  Basal rate will be changed as follows:  Basal rate at 3:30 AM = 1.5 and at 5 AM = 1.4 Carb ratio 1:5.5 at suppertime instead of 4.5, this will reduce tendency to low normal readings after dinner  Start exercise again  Add additional 2 to 3 units for eating out especially with higher fat meals More consistent correction boluses when blood sugars are persistently high She can finish up for her G6 Sensors and then go to [place] for now  Hyperlipidemia: Follow-up on next visit  Repeat ALT on the next visit  Follow-up in 4 months  Total visit time includes Counseling = 30 minutes  There are no Patient Instructions on file for this visit.   Elayne Snare 11/21/22   Note: This office note was prepared with Dragon voice recognition system technology. Any transcriptional errors that result from this process are  unintentional.

## 2022-11-26 ENCOUNTER — Other Ambulatory Visit: Payer: Self-pay | Admitting: Endocrinology

## 2022-11-26 DIAGNOSIS — E1065 Type 1 diabetes mellitus with hyperglycemia: Secondary | ICD-10-CM

## 2022-11-28 ENCOUNTER — Encounter: Payer: Self-pay | Admitting: Endocrinology

## 2022-12-02 DIAGNOSIS — E119 Type 2 diabetes mellitus without complications: Secondary | ICD-10-CM | POA: Diagnosis not present

## 2022-12-02 LAB — HM DIABETES EYE EXAM

## 2022-12-03 ENCOUNTER — Encounter: Payer: Self-pay | Admitting: Endocrinology

## 2022-12-03 ENCOUNTER — Telehealth: Payer: Self-pay

## 2022-12-03 NOTE — Telephone Encounter (Signed)
Inbound call from DME supplier requesting form be completed and faxed with clinical notes. DME supplies ordered via [employer] through online portal.

## 2022-12-04 ENCOUNTER — Encounter: Payer: Self-pay | Admitting: Internal Medicine

## 2022-12-08 ENCOUNTER — Encounter: Payer: Self-pay | Admitting: Family Medicine

## 2022-12-09 MED ORDER — GABAPENTIN 100 MG PO CAPS: 200.0000 mg | ORAL_CAPSULE | Freq: Every day | ORAL | 1 refills | Status: DC

## 2022-12-19 ENCOUNTER — Other Ambulatory Visit: Payer: Self-pay | Admitting: Endocrinology

## 2022-12-19 DIAGNOSIS — E1065 Type 1 diabetes mellitus with hyperglycemia: Secondary | ICD-10-CM

## 2023-01-20 ENCOUNTER — Encounter: Payer: Self-pay | Admitting: Cardiovascular Disease

## 2023-01-20 NOTE — Progress Notes (Signed)
[name] Date of Birth  [birth year] [facility]          [id number]    [place]     [place], [place]  [address]           Problems. 1. CAD 2. Atrial Flutter - s/p Aflutter ablation Nov. 2014.  3. Diabetes Mellitus.    [name] is doing very well. She's not having episodes of chest pain or shortness of breath. She's been able to below of her normal activities without any significant problems. She has not had chest pains .  Her insurance will run out the month before she goes on Medicare.   She has not had any CP and no palpitations.    April 08, 2013:   [name] is doing well. No CP.  Rhythm is stable.     Mar 11, 2014:  [name] is doing well.  She had a atrial flutter ablation several months ago.  She has not been taking her metoprolol..  Nov. 9, 2015:  [name] is doing well. No Cp , no dyspnea.  Active, not exerciseing as much as she would like.  Mar 16, 2015:   [name] is doing well.   Doing well from a cardiac standpoint.   Nov. 10, 2016  Lots of stress - her 88 yo mom is living with her.  Will not let anyone else do anything Lots of stress No CP , no arrhythmias No time to exercise   Mar 20, 2016:  Doing well Her mother is living back in [place].   Stress is a bit less.  Exercising some .   Nov. 22, 2017.  Doing well . Had afib ablation several years ago and feels great.  DM is well controlled.  Has UTIs frequently .   November 17, 2016: Seen back today for follow-up of her coronary artery disease. Doing well Falls occasionally ,  Broke her left kneecap and left ankle this past year.  Takes the niacin at night and the ASA 81 mg does not conrol the niacin flushng like the 325 mg niacin does.   Jan. 14, 2020  [name] is seen today . No CP or dyspnea  Exercising some.   Diabetes is well controlled.   Jan. 18, 2021  [name] is seen toda for follow up of her  CAD, atrial flutter, DM, hyperlipidemia Doing well.    VS look  great Exercising regularly .  No CP or dyspnea.  Is on ASA 325 for the effects of niacin at bedtime   May 22, 2020    [name] is seen today for follow up of her CAD, atrial flutter, DM, HLD Her daughter and family have moved back to [facility]  No cp, no dyspnea,   No palpitations suggestive of atrial flutter.   Jan. 24, 2022:  [name] is seen today for follow up of her CAD, atrial flutter, DM, HLD Doing well.  No cardiac issues  Diabetes is doing well. Has a new insulin pump  HbA1C looks good   January 18, 2022: [name] is seen today for follow-up of her coronary artery disease, atrial flutter, diabetes, hyperlipidemia.  No CP , no dyspnea  Is not getting enough exercise  Needs to do more cardio  January 21, 2023 [name] is seen today for follow up of her atrial flutter, DM, HLD Wt is 211 No CP, no dyspnea Some exercise  Tries to exercise more .   Just now putting away her  [place] glass Christmas Trees      Current Outpatient Medications on File Prior to Visit  Medication Sig Dispense Refill   ASPIRIN 81 PO Take by mouth.     Calcium Carbonate-Vitamin D (CALCIUM + D PO) Take 600 mg by mouth 2 (two) times daily.     cholecalciferol (VITAMIN D) 1000 units tablet Take 1,000 Units by mouth 2 (two) times daily.      clopidogrel (PLAVIX) 75 MG tablet TAKE 1 TABLET DAILY (KEEP UPCOMING APPOINTMENT IN MARCH 2023 WITH DR. Acie Fredrickson BEFORE ANYMORE REFILLS) 90 tablet 2   Continuous Blood Gluc Sensor (DEXCOM G6 SENSOR) MISC by Does not apply route.     ezetimibe (ZETIA) 10 MG tablet TAKE 1 TABLET DAILY 90 tablet 3   gabapentin (NEURONTIN) 100 MG capsule Take 2 capsules (200 mg total) by mouth at bedtime. 180 capsule 1   ibuprofen (ADVIL) 200 MG tablet Take 200 mg by mouth every 6 (six) hours as needed.     losartan (COZAAR) 25 MG tablet TAKE 1 TABLET DAILY 90 tablet 0   LYUMJEV 100 UNIT/ML SOLN USE A MAXIMUM OF 90 UNITS DAILY VIA INSULIN PUMP 80 mL 3   metFORMIN (GLUCOPHAGE-XR) 500  MG 24 hr tablet TAKE 1 TABLET IN THE MORNING AND 2 TABLETS IN THE EVENING AS DIRECTED 270 tablet 3   nitroGLYCERIN (NITROSTAT) 0.4 MG SL tablet DISSOLVE 1 TABLET UNDER THE TONGUE EVERY 5 MINUTES AS NEEDED FOR CHEST PAIN 75 tablet 2   pioglitazone (ACTOS) 30 MG tablet Take 1 tablet (30 mg total) by mouth daily. (Patient not taking: Reported on 01/21/2023) 90 tablet 3   rosuvastatin (CRESTOR) 40 MG tablet TAKE 1 TABLET DAILY 90 tablet 3   solifenacin (VESICARE) 10 MG tablet Take 1 tablet (10 mg total) by mouth daily. 90 tablet 3   TART CHERRY PO Take by mouth.     No current facility-administered medications on file prior to visit.    Allergies  Allergen Reactions   Sulfa Antibiotics Rash   Sulfamethoxazole-Trimethoprim Rash    Past Medical History:  Diagnosis Date   Arthritis    OA   Atrial flutter ([place])    a. s/p TEE/DCCV 08/16/13 (normal LV function, no LAA thrombus).b. s/p ablation by Dr Lovena Le 09-23-2013   Coronary artery disease    2009 LAD Promus stent   Dysrhythmia    Aflutter, no issues after ablation 2014   Fibroid    Hyperlipidemia    Insulin pump in place    Osteopenia    Type 1 diabetes mellitus on insulin therapy ([place])    Valvular heart disease    a. Mild MR/TR by TEE 08/2013.    Past Surgical History:  Procedure Laterality Date   ABLATION  09-23-2013   CTI by Dr Lovena Le   APPENDECTOMY     APPLICATION OF WOUND VAC Left 04/11/2017   Procedure: APPLICATION OF WOUND VAC LEFT KNEE;  Surgeon: Rod Can, MD;  Location: [place];  Service: Orthopedics;  Laterality: Left;   ATRIAL FLUTTER ABLATION N/A 09/23/2013   Procedure: ATRIAL FLUTTER ABLATION;  Surgeon: Evans Lance, MD;  Location: [facility] CATH LAB;  Service: Cardiovascular;  Laterality: N/A;   CARDIOVERSION N/A 08/16/2013   Procedure: CARDIOVERSION;  Surgeon: Thayer Headings, MD;  Location: [facility] ENDOSCOPY;  Service: Cardiovascular;  Laterality: N/A;  spoke with [name]   CATARACT EXTRACTION Bilateral    COLONOSCOPY      CORONARY ANGIOPLASTY WITH [place]  1977   ORIF PATELLA Left 04/11/2017   ORIF PATELLA Left 04/11/2017   Procedure: OPEN REDUCTION INTERNAL (ORIF) FIXATION PATELLA LEFT KNEE;  Surgeon: Rod Can, MD;  Location: [place];  Service: Orthopedics;  Laterality: Left;   PELVIC LAPAROSCOPY     TEE WITHOUT CARDIOVERSION N/A 08/16/2013   Procedure: TRANSESOPHAGEAL ECHOCARDIOGRAM (TEE);  Surgeon: Thayer Headings, MD;  [facility] ENDOSCOPY;  Service: Cardiovascular;  Laterality: N/A;    Social History   Tobacco Use  Smoking Status Never  Smokeless Tobacco Never    Social History   Substance and Sexual Activity  Alcohol Use Yes   Alcohol/week: 5.0 standard drinks of alcohol   Types: 5 Glasses of wine per week   Comment: on weekends    Family History  Problem Relation Age of Onset   CAD Father 12       died age 69   CAD Brother 67       small vessel disease   Atrial fibrillation Mother    Diabetes Mother    Hypertension Mother    CAD Cousin        paternal   CAD Paternal Grandfather        early onset   Colon cancer Neg Hx     Reviw of Systems:  Reviewed in the HPI.  All other systems are negative.  Physical Exam: Blood pressure 135/65, pulse 79, height 5' 9.5" (1.765 m), weight 211 lb 12.8 oz (96.1 kg), SpO2 97 %.       GEN:  Well nourished, well developed in no acute distress HEENT: Normal NECK: No JVD; No carotid bruits LYMPHATICS: No lymphadenopathy CARDIAC: RRR , no murmurs, rubs, gallops RESPIRATORY:  Clear to auscultation without rales, wheezing or rhonchi  ABDOMEN: Soft, non-tender, non-distended MUSCULOSKELETAL:  No edema; No deformity  SKIN: Warm and dry NEUROLOGIC:  Alert and oriented x 3   ECG:   January 21, 2023 -  NSR at 78. NS T wave abn.    Assessment / Plan:   1. CAD -    no angina ,  seems to be doing well    2. Atrial Flutter -    no recent episodes of a flutter .   3. Diabetes Mellitus.    Manged by  endocrinology     4. Hperlipidemia:    - continue same medications   5. Weight gain:   advised more exercise    Mertie Moores, MD  01/21/2023 1:36 PM    [facility] [place],  [address] [place], [place]  [address] Pager [phone] Phone: [phone]; Fax: [phone]

## 2023-01-21 ENCOUNTER — Ambulatory Visit: Payer: Medicare Other | Attending: Cardiovascular Disease | Admitting: Cardiovascular Disease

## 2023-01-21 ENCOUNTER — Encounter: Payer: Self-pay | Admitting: Cardiovascular Disease

## 2023-01-21 VITALS — BP 135/65 | HR 79 | Ht 69.5 in | Wt 211.8 lb

## 2023-01-21 DIAGNOSIS — I2510 Atherosclerotic heart disease of native coronary artery without angina pectoris: Secondary | ICD-10-CM | POA: Diagnosis not present

## 2023-01-21 DIAGNOSIS — E785 Hyperlipidemia, unspecified: Secondary | ICD-10-CM | POA: Diagnosis not present

## 2023-01-21 DIAGNOSIS — E1169 Type 2 diabetes mellitus with other specified complication: Secondary | ICD-10-CM | POA: Diagnosis not present

## 2023-01-21 MED ORDER — METOPROLOL SUCCINATE ER 100 MG PO TB24: ORAL_TABLET | ORAL | 3 refills | Status: DC

## 2023-01-21 NOTE — Patient Instructions (Signed)
Medication Instructions:  Your physician recommends that you continue on your current medications as directed. Please refer to the Current Medication list given to you today.  *If you need a refill on your cardiac medications before your next appointment, please call your pharmacy*   Lab Work: NONE If you have labs (blood work) drawn today and your tests are completely normal, you will receive your results only by: MyChart Message (if you have MyChart) OR A paper copy in the mail If you have any lab test that is abnormal or we need to change your treatment, we will call you to review the results.   Testing/Procedures: NONE   Follow-Up: At [facility], you and your health needs are our priority.  As part of our continuing mission to provide you with exceptional heart care, we have created designated Provider Care Teams.  These Care Teams include your primary Cardiologist (physician) and Advanced Practice Providers (APPs -  Physician Assistants and Nurse Practitioners) who all work together to provide you with the care you need, when you need it.  We recommend signing up for the patient portal called "MyChart".  Sign up information is provided on this After Visit Summary.  MyChart is used to connect with patients for Virtual Visits (Telemedicine).  Patients are able to view lab/test results, encounter notes, upcoming appointments, etc.  Non-urgent messages can be sent to your provider as well.   To learn more about what you can do with MyChart, go to [email or web address].    Your next appointment:   1 year(s)  Provider:   Philip [name], MD   

## 2023-01-23 ENCOUNTER — Telehealth: Payer: Self-pay

## 2023-01-23 NOTE — Telephone Encounter (Signed)
Office notes faxed through Epic per request of [place] through fax.

## 2023-01-30 DIAGNOSIS — Z23 Encounter for immunization: Secondary | ICD-10-CM | POA: Diagnosis not present

## 2023-02-10 ENCOUNTER — Other Ambulatory Visit: Payer: Self-pay | Admitting: Endocrinology

## 2023-02-10 DIAGNOSIS — I10 Essential (primary) hypertension: Secondary | ICD-10-CM

## 2023-03-18 ENCOUNTER — Encounter: Payer: Self-pay | Admitting: Internal Medicine

## 2023-03-18 ENCOUNTER — Ambulatory Visit (INDEPENDENT_AMBULATORY_CARE_PROVIDER_SITE_OTHER): Payer: Medicare Other | Admitting: Internal Medicine

## 2023-03-18 VITALS — BP 140/80 | HR 78 | Temp 98.0°F | Ht 69.5 in | Wt 211.0 lb

## 2023-03-18 DIAGNOSIS — R300 Dysuria: Secondary | ICD-10-CM | POA: Diagnosis not present

## 2023-03-18 DIAGNOSIS — N3000 Acute cystitis without hematuria: Secondary | ICD-10-CM

## 2023-03-18 LAB — POCT URINALYSIS DIPSTICK
Bilirubin, UA: NEGATIVE
Blood, UA: NEGATIVE
Glucose, UA: NEGATIVE
Ketones, UA: NEGATIVE
Nitrite, UA: NEGATIVE
Protein, UA: NEGATIVE
Spec Grav, UA: 1.015 (ref 1.010–1.025)
Urobilinogen, UA: NEGATIVE E.U./dL — AB
pH, UA: 6 (ref 5.0–8.0)

## 2023-03-18 MED ORDER — CEPHALEXIN 500 MG PO CAPS: 500.0000 mg | ORAL_CAPSULE | Freq: Two times a day (BID) | ORAL | 0 refills | Status: AC

## 2023-03-18 MED ORDER — CEPHALEXIN 500 MG PO CAPS: 500.0000 mg | ORAL_CAPSULE | Freq: Two times a day (BID) | ORAL | 0 refills | Status: DC

## 2023-03-18 NOTE — Progress Notes (Unsigned)
   Subjective:   Patient ID: [name], female    DOB: 22-Jul-1946, 77 y.o.   MRN: [id number]  HPI The patient is a 77 YO female coming in for possible UTI several weeks  Review of Systems  Objective:  Physical Exam  Vitals:   03/18/23 1603  BP: (!) 140/80  Pulse: 78  Temp: 98 F (36.7 C)  TempSrc: Oral  SpO2: 98%  Weight: 211 lb (95.7 kg)  Height: 5' 9.5" (1.765 m)    Assessment & Plan:

## 2023-03-19 ENCOUNTER — Encounter: Payer: Self-pay | Admitting: Internal Medicine

## 2023-03-19 NOTE — Assessment & Plan Note (Signed)
POC U/A done. Given recent culture with resistance to macrobid we did decide to do keflex 1 week course as she has had recent recurrence and is diabetic.

## 2023-03-31 ENCOUNTER — Encounter: Payer: Self-pay | Admitting: Internal Medicine

## 2023-04-01 ENCOUNTER — Other Ambulatory Visit: Payer: Medicare Other

## 2023-04-01 MED ORDER — NITROFURANTOIN MONOHYD MACRO 100 MG PO CAPS: 100.0000 mg | ORAL_CAPSULE | Freq: Two times a day (BID) | ORAL | 0 refills | Status: DC

## 2023-04-02 ENCOUNTER — Other Ambulatory Visit (INDEPENDENT_AMBULATORY_CARE_PROVIDER_SITE_OTHER): Payer: Medicare Other

## 2023-04-02 DIAGNOSIS — E1065 Type 1 diabetes mellitus with hyperglycemia: Secondary | ICD-10-CM | POA: Diagnosis not present

## 2023-04-02 DIAGNOSIS — E782 Mixed hyperlipidemia: Secondary | ICD-10-CM

## 2023-04-02 LAB — LIPID PANEL
Cholesterol: 107 mg/dL (ref 0–200)
HDL: 40 mg/dL (ref >39.00)
LDL Cholesterol: 39 mg/dL (ref 0–99)
NonHDL: 66.54
Total CHOL/HDL Ratio: 3
Triglycerides: 136 mg/dL (ref 0.0–149.0)
VLDL: 27.2 mg/dL (ref 0.0–40.0)

## 2023-04-02 LAB — COMPREHENSIVE METABOLIC PANEL
ALT: 46 U/L — ABNORMAL HIGH (ref 0–35)
AST: 26 U/L (ref 0–37)
Albumin: 3.7 g/dL (ref 3.5–5.2)
Alkaline Phosphatase: 105 U/L (ref 39–117)
BUN: 17 mg/dL (ref 6–23)
CO2: 30 mEq/L (ref 19–32)
Calcium: 9 mg/dL (ref 8.4–10.5)
Chloride: 102 mEq/L (ref 96–112)
Creatinine, Ser: 0.85 mg/dL (ref 0.40–1.20)
GFR: 67.08 mL/min (ref >60.00)
Glucose, Bld: 181 mg/dL — ABNORMAL HIGH (ref 70–99)
Potassium: 4.4 mEq/L (ref 3.5–5.1)
Sodium: 138 mEq/L (ref 135–145)
Total Bilirubin: 0.3 mg/dL (ref 0.2–1.2)
Total Protein: 6.7 g/dL (ref 6.0–8.3)

## 2023-04-02 LAB — HEMOGLOBIN A1C: Hgb A1c MFr Bld: 7.1 % — ABNORMAL HIGH (ref 4.6–6.5)

## 2023-04-04 ENCOUNTER — Encounter: Payer: Self-pay | Admitting: Endocrinology

## 2023-04-04 ENCOUNTER — Ambulatory Visit (INDEPENDENT_AMBULATORY_CARE_PROVIDER_SITE_OTHER): Payer: Medicare Other | Admitting: Endocrinology

## 2023-04-04 VITALS — BP 138/72 | HR 75 | Ht 69.5 in | Wt 209.6 lb

## 2023-04-04 DIAGNOSIS — E782 Mixed hyperlipidemia: Secondary | ICD-10-CM

## 2023-04-04 DIAGNOSIS — R748 Abnormal levels of other serum enzymes: Secondary | ICD-10-CM

## 2023-04-04 DIAGNOSIS — E1065 Type 1 diabetes mellitus with hyperglycemia: Secondary | ICD-10-CM | POA: Diagnosis not present

## 2023-04-04 NOTE — Patient Instructions (Signed)
Extra 20-40 g carbs for hi fat meals  Exercise mode

## 2023-04-04 NOTE — Progress Notes (Signed)
Patient ID: [name], female   DOB: 1946/06/04, 77 y.o.   MRN: [id number]   Reason for visit: DIABETES followup.  Diagnosis: Type 1 diabetes mellitus, date of diagnosis: 1979.    HISTORY of present illness:  DIABETES: She has had long-standing diabetes with A1c levels usually above target. In 08/2012 pioglitazone was restarted because of her large insulin requirement and diabetic dyslipidemia.  Her blood sugars improved significantly with this.  Also has been taking metformin as insulin sensitizer  Insulin Pump: CURRENT brand: T-Slim since 08/2020  BASAL settings:  Midnight = 0.5, 3:30 AM = 1.4, 6 AM = 2.2, 10 AM = 0.75, 1 PM = 1.6, 330 PM = 1.9, 4:30 PM = 1.8,  8 PM = 3.2 and 10 PM = 1.8.     Carbohydrate ratio 1: 12 breakfast, 1: 5 lunch and 1: 4.5 at dinner; sensitivity 1: 25 from 12 AM- 10 AM, 1: 30 from 10-11 AM, 1-4:30 PM = 1: 20 Target 125  Insulin on board indicator on, duration 4 hrs   Total daily insulin = usually about 60-65 units, maximum 90, using Lyumjev   RECENT history:   Her A1c is 7.1, was 7.3 Lowest reading previously 6.6  Current diabetes management, blood sugar patterns from sensor and pump download:  Insulin pump management management: Blood sugars 75% within the target range, slightly worse However most of her postprandial readings are occurring after lunch and dinner and likely related to inadequate mealtime coverage Has some more variability compared to before also She is not able to figure out how much insulin to give when she is eating out especially with higher fat meals and likely is only estimating the carbohydrate intake She says that when she is able to use her pump itself she will try to extend the bolus but unable to do this with her smart phone app  She has also started exercising with walking or other activities She does not think this causes excessive hypoglycemia but will get somewhat low right after finishing or up to 30  minutes later Overnight blood sugars are variable but averaging 153 for the night segment, because of using the sleep mode she is usually not getting correction boluses Hypoglycemia has occurred rarely overnight and occasionally midday from exercise CGM details as below  Her blood sugar analysis from the [facility] data for the last 2 weeks is interpreted as below  OVERNIGHT blood sugars are variable including occasional low sugars around 2-3 AM and some inconsistently high readings also On average Premeal readings are slightly higher at lunchtime but generally in the low 100 range before dinner Postprandial readings: After breakfast time blood sugars are occasionally above the target range but only mildly After lunch blood sugars may periodically go significantly high well over 200 but on average week readings are still below 180 Blood sugars may be more variable after dinner with highest readings late after 10 PM and absolute rise on an average not excessive Peak reading after dinner is also under 180 Hypoglycemia occurring occasionally overnight and midday    Her CGM shows the following data for the last 2 weeks:     CGM use % of time   2-week average/SD 150/49  Time in range      75%  % Time Above 180 19  % Time above 250 3.6  % Time Below 70 2.2      PRE-MEAL  overnight  mornings  afternoon  evening Overall  Glucose range:  Averages: 153 135 156 153    Previously  CGM use % of time   2-week average/SD 147/40  Time in range 78  % Time Above 180 21  % Time above 250   % Time Below 70 1      PRE-MEAL  overnight  mornings  afternoon  evening Overall  Glucose range:       Averages: 154 130 158 147      Previously:   CGM use % of time   2-week average/SD 144  Time in range 82  % Time Above 180 17  % Time above 250   % Time Below 70 2      PRE-MEAL  overnight  mornings  afternoon  evening Overall  Glucose range:       Averages: 143 123 149 152+/-54      Hypoglycemic awareness: Has symptoms of feeling tired, headache, sweaty.  She thinks he can usually recognize symptoms  Sometimes may not be aware during the night and will be notified by her sensor that the sugar is low  She has symptoms when blood glucose is less than 60. Uses glucose tablets for treatment even overnight     DIET has been usually fairly controlled with carbohydrate and fat intake, tends to eat out on weekends The mealtimes are variable.    Food preferences: Yogurt and cottage cheese for breakfast usually.   Blood sugars with exercise: No significant problems recently with adequate control and no hypoglycemia  .  Wt Readings from Last 3 Encounters:  04/04/23 209 lb 9.6 oz (95.1 kg)  03/18/23 211 lb (95.7 kg)  01/21/23 211 lb 12.8 oz (96.1 kg)    LABS:  Lab Results  Component Value Date   HGBA1C 7.1 (H) 04/02/2023   HGBA1C 7.3 (H) 11/19/2022   HGBA1C 6.6 (H) 07/11/2022   Lab Results  Component Value Date   MICROALBUR <0.7 07/11/2022   LDLCALC 39 04/02/2023   CREATININE 0.85 04/02/2023    Lab on 04/02/2023  Component Date Value Ref Range Status   Cholesterol 04/02/2023 107  0 - 200 mg/dL Final   ATP III Classification       Desirable:  < 200 mg/dL               Borderline High:  200 - 239 mg/dL          High:  > = 161 mg/dL   Triglycerides 09/60/4540 136.0  0.0 - 149.0 mg/dL Final   Normal:  <981 mg/dLBorderline High:  150 - 199 mg/dL   HDL 19/14/7829 56.21  >39.00 mg/dL Final   VLDL 30/86/5784 27.2  0.0 - 40.0 mg/dL Final   LDL Cholesterol 04/02/2023 39  0 - 99 mg/dL Final   Total CHOL/HDL Ratio 04/02/2023 3   Final                  Men          Women1/2 Average Risk     3.4          3.3Average Risk          5.0          4.42X Average Risk          9.6          7.13X Average Risk          15.0          11.0  NonHDL 04/02/2023 66.54   Final   NOTE:  Non-HDL goal should be 30 mg/dL higher than patient's LDL goal (i.e. LDL goal  of < 70 mg/dL, would have non-HDL goal of < 100 mg/dL)   Sodium 16/08/9603 540  135 - 145 mEq/L Final   Potassium 04/02/2023 4.4  3.5 - 5.1 mEq/L Final   Chloride 04/02/2023 102  96 - 112 mEq/L Final   CO2 04/02/2023 30  19 - 32 mEq/L Final   Glucose, Bld 04/02/2023 181 (H)  70 - 99 mg/dL Final   BUN 98/09/9146 17  6 - 23 mg/dL Final   Creatinine, Ser 04/02/2023 0.85  0.40 - 1.20 mg/dL Final   Total Bilirubin 04/02/2023 0.3  0.2 - 1.2 mg/dL Final   Alkaline Phosphatase 04/02/2023 105  39 - 117 U/L Final   AST 04/02/2023 26  0 - 37 U/L Final   ALT 04/02/2023 46 (H)  0 - 35 U/L Final   Total Protein 04/02/2023 6.7  6.0 - 8.3 g/dL Final   Albumin 82/95/6213 3.7  3.5 - 5.2 g/dL Final   GFR 08/65/7846 67.08  >60.00 mL/min Final   Calculated using the CKD-EPI Creatinine Equation (2021)   Calcium 04/02/2023 9.0  8.4 - 10.5 mg/dL Final   Hgb N6E MFr Bld 04/02/2023 7.1 (H)  4.6 - 6.5 % Final   Glycemic Control Guidelines for People with Diabetes:Non Diabetic:  <6%Goal of Therapy: <7%Additional Action Suggested:  >8%      Allergies as of 04/04/2023       Reactions   Sulfa Antibiotics Rash   Sulfamethoxazole-trimethoprim Rash        Medication List        Accurate as of Apr 04, 2023  5:49 PM. If you have any questions, ask your nurse or doctor.          ASPIRIN 81 PO Take by mouth.   CALCIUM + D PO Take 600 mg by mouth 2 (two) times daily.   cholecalciferol 1000 units tablet Commonly known as: VITAMIN D Take 1,000 Units by mouth 2 (two) times daily.   clopidogrel 75 MG tablet Commonly known as: PLAVIX TAKE 1 TABLET DAILY (KEEP UPCOMING APPOINTMENT IN MARCH 2023 WITH DR. Elease Hashimoto BEFORE ANYMORE REFILLS)   Dexcom G6 Sensor Misc by Does not apply route.   ezetimibe 10 MG tablet Commonly known as: ZETIA TAKE 1 TABLET DAILY   gabapentin 100 MG capsule Commonly known as: NEURONTIN Take 2 capsules (200 mg total) by mouth at bedtime.   ibuprofen 200 MG tablet Commonly  known as: ADVIL Take 200 mg by mouth every 6 (six) hours as needed.   losartan 25 MG tablet Commonly known as: COZAAR TAKE 1 TABLET DAILY   Lyumjev 100 UNIT/ML Soln Generic drug: Insulin Lispro-aabc USE A MAXIMUM OF 90 UNITS DAILY VIA INSULIN PUMP   metFORMIN 500 MG 24 hr tablet Commonly known as: GLUCOPHAGE-XR TAKE 1 TABLET IN THE MORNING AND 2 TABLETS IN THE EVENING AS DIRECTED   metoprolol succinate 100 MG 24 hr tablet Commonly known as: TOPROL-XL TAKE 1 TABLET DAILY   nitrofurantoin (macrocrystal-monohydrate) 100 MG capsule Commonly known as: Macrobid Take 1 capsule (100 mg total) by mouth 2 (two) times daily.   nitroGLYCERIN 0.4 MG SL tablet Commonly known as: NITROSTAT DISSOLVE 1 TABLET UNDER THE TONGUE EVERY 5 MINUTES AS NEEDED FOR CHEST PAIN   rosuvastatin 40 MG tablet Commonly known as: CRESTOR TAKE 1 TABLET DAILY   solifenacin 10 MG tablet Commonly known  as: VESICARE Take 1 tablet (10 mg total) by mouth daily.   TART CHERRY PO Take by mouth.        Allergies:  Allergies  Allergen Reactions   Sulfa Antibiotics Rash   Sulfamethoxazole-Trimethoprim Rash    Past Medical History:  Diagnosis Date   Arthritis    OA   Atrial flutter (HCC)    a. s/p TEE/DCCV 08/16/13 (normal LV function, no LAA thrombus).b. s/p ablation by Dr Ladona Ridgel 09-23-2013   Coronary artery disease    2009 LAD Promus stent   Dysrhythmia    Aflutter, no issues after ablation 2014   Fibroid    Hyperlipidemia    Insulin pump in place    Osteopenia    Type 1 diabetes mellitus on insulin therapy (HCC)    Valvular heart disease    a. Mild MR/TR by TEE 08/2013.    Past Surgical History:  Procedure Laterality Date   ABLATION  09-23-2013   CTI by Dr Ladona Ridgel   APPENDECTOMY     APPLICATION OF WOUND VAC Left 04/11/2017   Procedure: APPLICATION OF WOUND VAC LEFT KNEE;  Surgeon: Samson Frederic, MD;  Location: [facility] OR;  Service: Orthopedics;  Laterality: Left;   ATRIAL FLUTTER ABLATION N/A  09/23/2013   Procedure: ATRIAL FLUTTER ABLATION;  Surgeon: Marinus Maw, MD;  Location: [facility] CATH LAB;  Service: Cardiovascular;  Laterality: N/A;   CARDIOVERSION N/A 08/16/2013   Procedure: CARDIOVERSION;  Surgeon: Vesta Mixer, MD;  Location: [facility] ENDOSCOPY;  Service: Cardiovascular;  Laterality: N/A;  spoke with Tom   CATARACT EXTRACTION Bilateral    COLONOSCOPY     CORONARY ANGIOPLASTY WITH STENT PLACEMENT     MYOMECTOMY  1977   ORIF PATELLA Left 04/11/2017   ORIF PATELLA Left 04/11/2017   Procedure: OPEN REDUCTION INTERNAL (ORIF) FIXATION PATELLA LEFT KNEE;  Surgeon: Samson Frederic, MD;  Location: [facility] OR;  Service: Orthopedics;  Laterality: Left;   PELVIC LAPAROSCOPY     TEE WITHOUT CARDIOVERSION N/A 08/16/2013   Procedure: TRANSESOPHAGEAL ECHOCARDIOGRAM (TEE);  Surgeon: Vesta Mixer, MD;  Location: [facility] ENDOSCOPY;  Service: Cardiovascular;  Laterality: N/A;    Family History  Problem Relation Age of Onset   CAD Father 17       died age 55   CAD Brother 30       small vessel disease   Atrial fibrillation Mother    Diabetes Mother    Hypertension Mother    CAD Cousin        paternal   CAD Paternal Grandfather        early onset   Colon cancer Neg Hx     Social History:  reports that she has never smoked. She has never used smokeless tobacco. She reports current alcohol use of about 5.0 standard drinks of alcohol per week. She reports that she does not use drugs.  REVIEW of systems:   Cardiac function: Her echo shows only grade 1 diastolic dysfunction and normal systolic function  She has had atrial arrhythmias treated with metoprolol  Blood pressure consistently normal, on 25 mg losartan from cardiologist for preventive reasons  BP Readings from Last 3 Encounters:  04/04/23 138/72  03/18/23 (!) 140/80  01/21/23 135/65     She has had diabetic dyslipidemia with significantly high LDL particle number This has been well controlled with Crestor 40 mg, previously on  Niaspan Her last LDL particle number was down to below 900 previously  Niacin was stopped because of tending to  get flushing and itching which may be late at night, not generally prevented by aspirin, also regimen was difficult to do at night  She has been consistent with generic Crestor 40 and Zetia without side effects  Zetia added in 3/19  Labs as follows:  Lab Results  Component Value Date   CHOL 107 04/02/2023   CHOL 105 07/11/2022   CHOL 104 10/11/2021   Lab Results  Component Value Date   HDL 40.00 04/02/2023   HDL 43.00 07/11/2022   HDL 46.50 10/11/2021   Lab Results  Component Value Date   LDLCALC 39 04/02/2023   LDLCALC 45 07/11/2022   LDLCALC 43 10/11/2021   Lab Results  Component Value Date   TRIG 136.0 04/02/2023   TRIG 82.0 07/11/2022   TRIG 76.0 10/11/2021   Lab Results  Component Value Date   CHOLHDL 3 04/02/2023   CHOLHDL 2 07/11/2022   CHOLHDL 2 10/11/2021   Lab Results  Component Value Date   LDLDIRECT 79.0 01/11/2015   LDLDIRECT 73.1 10/13/2014     She has had previously normal thyroid levels  Lab Results  Component Value Date   TSH 2.33 10/11/2021   TSH 2.23 10/06/2019   TSH 1.86 01/19/2019    No retinopathy on last exam   Not clear why her ALT is slightly high again, takes Advil on average about 2/day but occasionally none Has been on Crestor for several years and has no other new medications  Lab Results  Component Value Date   ALT 46 (H) 04/02/2023     BP 138/72   Pulse 75   Ht 5' 9.5" (1.765 m)   Wt 209 lb 9.6 oz (95.1 kg)   SpO2 97%   BMI 30.51 kg/m      ASSESSMENT/PLAN:  DIABETES type 1 on T-slim insulin pump  Her A1c is stable at 7.1   See history of present illness for detailed discussion of her current blood sugar patterns seen on the CGM and management with insulin pump  Time in range is reasonably good at 75 % Does not have excessive hypoglycemia As discussed above her postprandial readings are  not consistently controlled because of variable diet and likely getting higher readings when eating out or higher fat meals Postprandial readings are still averaging below 180 at the highest  Current patterns and day-to-day management was discussed  Recommendations:  Basal rate will be continued unchanged for now She will add 20 to 40 g extra over the carbohydrate intake for accounting for high-fat meals and take the total bolus before eating, may take supplemental boluses if blood sugar still goes up Try to use the exercise mode at the time of starting exercise and turn off about 30 minutes after finishing Continue Actos also  Hyperlipidemia: Very well can hold  Repeat ALT to be done to check on liver functions after leaving off Advil for at least 4 to 5 days  Follow-up in 4 months  Total visit time includes Counseling = 30 minutes  Patient Instructions  Extra 20-40 g carbs for hi fat meals  Exercise mode    Reather Littler 04/04/23   Note: This office note was prepared with Dragon voice recognition system technology. Any transcriptional errors that result from this process are unintentional.

## 2023-04-09 ENCOUNTER — Encounter: Payer: Self-pay | Admitting: Endocrinology

## 2023-04-18 ENCOUNTER — Encounter: Payer: Self-pay | Admitting: Internal Medicine

## 2023-04-18 ENCOUNTER — Other Ambulatory Visit: Payer: Self-pay

## 2023-04-18 ENCOUNTER — Encounter: Payer: Self-pay | Admitting: Endocrinology

## 2023-04-18 ENCOUNTER — Other Ambulatory Visit: Payer: Self-pay | Admitting: Endocrinology

## 2023-04-18 ENCOUNTER — Encounter: Payer: Self-pay | Admitting: Cardiovascular Disease

## 2023-04-18 ENCOUNTER — Other Ambulatory Visit (INDEPENDENT_AMBULATORY_CARE_PROVIDER_SITE_OTHER): Payer: Medicare Other

## 2023-04-18 ENCOUNTER — Encounter: Payer: Self-pay | Admitting: Family Medicine

## 2023-04-18 DIAGNOSIS — R748 Abnormal levels of other serum enzymes: Secondary | ICD-10-CM

## 2023-04-18 DIAGNOSIS — I10 Essential (primary) hypertension: Secondary | ICD-10-CM

## 2023-04-18 LAB — ALT: ALT: 31 U/L (ref 0–35)

## 2023-04-18 MED ORDER — LOSARTAN POTASSIUM 25 MG PO TABS
25.0000 mg | ORAL_TABLET | Freq: Every day | ORAL | 0 refills | Status: DC
Start: 2023-04-18 — End: 2023-07-08

## 2023-04-18 MED ORDER — CLOPIDOGREL BISULFATE 75 MG PO TABS: ORAL_TABLET | ORAL | 2 refills | Status: DC

## 2023-04-18 MED ORDER — GABAPENTIN 100 MG PO CAPS: 200.0000 mg | ORAL_CAPSULE | Freq: Every day | ORAL | 1 refills | Status: DC

## 2023-04-21 ENCOUNTER — Telehealth: Payer: Self-pay

## 2023-04-21 NOTE — Telephone Encounter (Signed)
Notes have been faxed via epic per incoming request from [facility].

## 2023-05-12 ENCOUNTER — Encounter: Payer: Self-pay | Admitting: Family Medicine

## 2023-05-19 ENCOUNTER — Other Ambulatory Visit: Payer: Self-pay | Admitting: Endocrinology

## 2023-05-22 DIAGNOSIS — Z1231 Encounter for screening mammogram for malignant neoplasm of breast: Secondary | ICD-10-CM | POA: Diagnosis not present

## 2023-05-22 LAB — HM MAMMOGRAPHY

## 2023-05-23 ENCOUNTER — Encounter: Payer: Self-pay | Admitting: Internal Medicine

## 2023-05-27 DIAGNOSIS — H903 Sensorineural hearing loss, bilateral: Secondary | ICD-10-CM | POA: Diagnosis not present

## 2023-06-22 ENCOUNTER — Other Ambulatory Visit: Payer: Self-pay | Admitting: Endocrinology

## 2023-06-22 DIAGNOSIS — E1065 Type 1 diabetes mellitus with hyperglycemia: Secondary | ICD-10-CM

## 2023-06-23 ENCOUNTER — Other Ambulatory Visit: Payer: Self-pay | Admitting: Endocrinology

## 2023-06-24 NOTE — Progress Notes (Unsigned)
[facility] [address] [place] [address] Phone: [phone] Subjective:   INadine Counts, am serving as a scribe for Dr. Antoine Primas.  I'm seeing this patient by the request  of:  Myrlene Broker, MD  CC: Low back pain  [id number]  07/10/2022 Discussed with patient at great length.  We discussed with her being somewhat noncompliant with some of the exercises.  Continues formal physical therapy again if necessary.  Patient is finding herself from being relatively active though.  We discussed continue to work on core strength and weight loss.  Follow-up with me again in 3 to 4 months      Update 06/25/2023 ELLIN CONFAIR is a 77 y.o. female coming in with complaint of low back pain. Patient states pain in the back seems to be doing well.  Having more of a left CMC joint.  Patient states it feels very similar to the contralateral side but not as severe.  Has been wearing the brace on the right side.       Past Medical History:  Diagnosis Date   Arthritis    OA   Atrial flutter (HCC)    a. s/p TEE/DCCV 08/16/13 (normal LV function, no LAA thrombus).b. s/p ablation by Dr Ladona Ridgel 09-23-2013   Coronary artery disease    2009 LAD Promus stent   Dysrhythmia    Aflutter, no issues after ablation 2014   Fibroid    Hyperlipidemia    Insulin pump in place    Osteopenia    Type 1 diabetes mellitus on insulin therapy (HCC)    Valvular heart disease    a. Mild MR/TR by TEE 08/2013.   Past Surgical History:  Procedure Laterality Date   ABLATION  09-23-2013   CTI by Dr Ladona Ridgel   APPENDECTOMY     APPLICATION OF WOUND VAC Left 04/11/2017   Procedure: APPLICATION OF WOUND VAC LEFT KNEE;  Surgeon: Samson Frederic, MD;  Location: [facility] OR;  Service: Orthopedics;  Laterality: Left;   ATRIAL FLUTTER ABLATION N/A 09/23/2013   Procedure: ATRIAL FLUTTER ABLATION;  Surgeon: Marinus Maw, MD;  Location[facility] CATH LAB;  Service: Cardiovascular;   Laterality: N/A;   CARDIOVERSION N/A 08/16/2013   Procedure: CARDIOVERSION;  Surgeon: Vesta Mixer, MD;  Location: [facility] ENDOSCOPY;  Service: Cardiovascular;  Laterality: N/A;  spoke with Tom   CATARACT EXTRACTION Bilateral    COLONOSCOPY     CORONARY ANGIOPLASTY WITH STENT PLACEMENT     MYOMECTOMY  1977   ORIF PATELLA Left 04/11/2017   ORIF PATELLA Left 04/11/2017   Procedure: OPEN REDUCTION INTERNAL (ORIF) FIXATION PATELLA LEFT KNEE;  Surgeon: Samson Frederic, MD;  Location: [facility] OR;  Service: Orthopedics;  Laterality: Left;   PELVIC LAPAROSCOPY     TEE WITHOUT CARDIOVERSION N/A 08/16/2013   Procedure: TRANSESOPHAGEAL ECHOCARDIOGRAM (TEE);  Surgeon: Vesta Mixer, MD;  Location: [facility] ENDOSCOPY;  Service: Cardiovascular;  Laterality: N/A;   Social History   Socioeconomic History   Marital status: Married    Spouse name: [name]   Number of children: 2   Years of education: 14   Highest education level: Not on file  Occupational History   Occupation: home maker    Comment: Children adopted  Tobacco Use   Smoking status: Never   Smokeless tobacco: Never  Vaping Use   Vaping status: Never Used  Substance and Sexual Activity   Alcohol use: Yes    Alcohol/week: 5.0 standard drinks of alcohol  Types: 5 Glasses of wine per week    Comment: on weekends   Drug use: No   Sexual activity: Yes    Birth control/protection: Post-menopausal  Other Topics Concern   Not on file  Social History Narrative   Not on file   Social Determinants of Health   Financial Resource Strain: Low Risk  (09/10/2022)   Overall Financial Resource Strain (CARDIA)    Difficulty of Paying Living Expenses: Not hard at all  Food Insecurity: No Food Insecurity (09/10/2022)   Hunger Vital Sign    Worried About Running Out of Food in the Last Year: Never true    Ran Out of Food in the Last Year: Never true  Transportation Needs: No Transportation Needs (09/10/2022)   PRAPARE - Scientist, research (physical sciences) (Medical): No    Lack of Transportation (Non-Medical): No  Physical Activity: Sufficiently Active (09/10/2022)   Exercise Vital Sign    Days of Exercise per Week: 4 days    Minutes of Exercise per Session: 40 min  Stress: No Stress Concern Present (09/10/2022)   [name] of Occupational Health - Occupational Stress Questionnaire    Feeling of Stress : Not at all  Social Connections: Socially Integrated (09/10/2022)   Social Connection and Isolation Panel [NHANES]    Frequency of Communication with Friends and Family: More than three times a week    Frequency of Social Gatherings with Friends and Family: More than three times a week    Attends Religious Services: More than 4 times per year    Active Member of [employer] or Organizations: Yes    Attends Engineer, structural: More than 4 times per year    Marital Status: Married   Allergies  Allergen Reactions   Sulfa Antibiotics Rash   Sulfamethoxazole-Trimethoprim Rash   Family History  Problem Relation Age of Onset   CAD Father 54       died age 76   CAD Brother 30       small vessel disease   Atrial fibrillation Mother    Diabetes Mother    Hypertension Mother    CAD Cousin        paternal   CAD Paternal Grandfather        early onset   Colon cancer Neg Hx     Current Outpatient Medications (Endocrine & Metabolic):    LYUMJEV 100 UNIT/ML SOLN, USE A MAXIMUM OF 90 UNITS DAILY VIA INSULIN PUMP   metFORMIN (GLUCOPHAGE-XR) 500 MG 24 hr tablet, TAKE 1 TABLET IN THE MORNING AND 2 TABLETS IN THE EVENING AS DIRECTED  Current Outpatient Medications (Cardiovascular):    ezetimibe (ZETIA) 10 MG tablet, TAKE 1 TABLET DAILY   losartan (COZAAR) 25 MG tablet, Take 1 tablet (25 mg total) by mouth daily.   metoprolol succinate (TOPROL-XL) 100 MG 24 hr tablet, TAKE 1 TABLET DAILY   nitroGLYCERIN (NITROSTAT) 0.4 MG SL tablet, DISSOLVE 1 TABLET UNDER THE TONGUE EVERY 5 MINUTES AS NEEDED FOR CHEST PAIN    rosuvastatin (CRESTOR) 40 MG tablet, TAKE 1 TABLET DAILY   Current Outpatient Medications (Analgesics):    ASPIRIN 81 PO, Take by mouth.   ibuprofen (ADVIL) 200 MG tablet, Take 200 mg by mouth every 6 (six) hours as needed.  Current Outpatient Medications (Hematological):    clopidogrel (PLAVIX) 75 MG tablet, TAKE 1 TABLET DAILY (KEEP UPCOMING APPOINTMENT IN MARCH 2023 WITH DR. Elease Hashimoto BEFORE ANYMORE REFILLS)  Current Outpatient Medications (Other):  Calcium Carbonate-Vitamin D (CALCIUM + D PO), Take 600 mg by mouth 2 (two) times daily.   cholecalciferol (VITAMIN D) 1000 units tablet, Take 1,000 Units by mouth 2 (two) times daily.    Continuous Blood Gluc Sensor (DEXCOM G6 SENSOR) MISC, by Does not apply route.   gabapentin (NEURONTIN) 100 MG capsule, Take 2 capsules (200 mg total) by mouth at bedtime.   Multiple Vitamins-Minerals (PRESERVISION AREDS 2 PO), Take by mouth in the morning and at bedtime.   nitrofurantoin, macrocrystal-monohydrate, (MACROBID) 100 MG capsule, Take 1 capsule (100 mg total) by mouth 2 (two) times daily.   solifenacin (VESICARE) 10 MG tablet, Take 1 tablet (10 mg total) by mouth daily.   TART CHERRY PO, Take by mouth.    Objective  Height 5\' 9"  (1.753 m).   General: No apparent distress alert and oriented x3 mood and affect normal, dressed appropriately.  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  The left CMC joint does have some as well.    Impression and Recommendations:    The above documentation has been reviewed and is accurate and complete Judi Saa, DO

## 2023-06-25 ENCOUNTER — Encounter: Payer: Self-pay | Admitting: Family Medicine

## 2023-06-25 ENCOUNTER — Ambulatory Visit (INDEPENDENT_AMBULATORY_CARE_PROVIDER_SITE_OTHER): Payer: Medicare Other | Admitting: Family Medicine

## 2023-06-25 ENCOUNTER — Other Ambulatory Visit: Payer: Self-pay

## 2023-06-25 ENCOUNTER — Encounter: Payer: Medicare Other | Attending: Endocrinology | Admitting: Nutrition

## 2023-06-25 VITALS — BP 122/78 | HR 73 | Ht 69.0 in | Wt 205.0 lb

## 2023-06-25 DIAGNOSIS — E108 Type 1 diabetes mellitus with unspecified complications: Secondary | ICD-10-CM | POA: Insufficient documentation

## 2023-06-25 DIAGNOSIS — M180 Bilateral primary osteoarthritis of first carpometacarpal joints: Secondary | ICD-10-CM | POA: Diagnosis not present

## 2023-06-25 DIAGNOSIS — M25532 Pain in left wrist: Secondary | ICD-10-CM

## 2023-06-25 NOTE — Assessment & Plan Note (Addendum)
Continues to have disc pain and now having on the contralateral side as well.  Discussed icing regimen and home exercises, patient declined injections at the moment.  Increase activity slowly otherwise.  Follow-up again as needed thumb spica removable splint given for the contralateral side.

## 2023-06-25 NOTE — Patient Instructions (Signed)
Brace day and night for 2 weeks Then only at night for another 2 weeks Use Voltaren as needed Don't let that worse er half of yours kill ya Enjoy the grandbabies

## 2023-06-25 NOTE — Progress Notes (Signed)
Patient wanting me to check her new pump to see if the settings she put in last week were correct.  Settings were checked and on slight change was made on the time of one basal rate by 30 minutes.  All other settings were put in correctly. Time in office less than 5 minutes

## 2023-06-27 ENCOUNTER — Encounter: Payer: Self-pay | Admitting: Endocrinology

## 2023-06-27 ENCOUNTER — Other Ambulatory Visit (INDEPENDENT_AMBULATORY_CARE_PROVIDER_SITE_OTHER): Payer: Medicare Other

## 2023-06-27 DIAGNOSIS — E1065 Type 1 diabetes mellitus with hyperglycemia: Secondary | ICD-10-CM | POA: Diagnosis not present

## 2023-06-27 LAB — HEMOGLOBIN A1C: Hgb A1c MFr Bld: 7 % — ABNORMAL HIGH (ref 4.6–6.5)

## 2023-06-30 ENCOUNTER — Encounter: Payer: Medicare Other | Admitting: Internal Medicine

## 2023-07-01 ENCOUNTER — Encounter: Payer: Self-pay | Admitting: Endocrinology

## 2023-07-01 ENCOUNTER — Ambulatory Visit (INDEPENDENT_AMBULATORY_CARE_PROVIDER_SITE_OTHER): Payer: Medicare Other | Admitting: Endocrinology

## 2023-07-01 VITALS — BP 120/60 | HR 80 | Ht 69.0 in | Wt 206.6 lb

## 2023-07-01 DIAGNOSIS — E109 Type 1 diabetes mellitus without complications: Secondary | ICD-10-CM

## 2023-07-01 LAB — MICROALBUMIN / CREATININE URINE RATIO
Creatinine,U: 31.9 mg/dL
Microalb Creat Ratio: 2.2 mg/g (ref 0.0–30.0)
Microalb, Ur: <0.7 mg/dL (ref 0.0–1.9)

## 2023-07-01 NOTE — Progress Notes (Signed)
Outpatient Endocrinology Note [place] Tc Kapusta, MD  07/01/23  Patient's Name: [name]    DOB: May 21, 1946    MRN: [id number]                                                    REASON OF VISIT: Follow up for type 1 diabetes mellitus  PCP: Myrlene Broker, MD  HISTORY OF PRESENT ILLNESS:   [name] is a 77 y.o. old female with past medical history listed below, is here for follow up of type 1 diabetes mellitus.   Pertinent Diabetes History: Patient was diagnosed with type 1 diabetes mellitus in 1979.  She has been on t:slim tandem insulin pump.  She has also been on metformin.  She was to be on pioglitazone in the past which had decreased requirement of insulin dose.  She has been on t:slim tandem insulin pump since October 2021.  Chronic Diabetes Complications : Retinopathy: no. Last ophthalmology exam was done on annually. Nephropathy: no, on losartan. Peripheral neuropathy: no Coronary artery disease: no Stroke: no  Relevant comorbidities and cardiovascular risk factors: Obesity: yes Body mass index is 30.51 kg/m.  Hypertension: yes Hyperlipidemia. yes  Current / Home Diabetic regimen includes:  T:slim insulin pump with Dexcom G7 uses Lyumjev   Insulin Pump setting:  Basal MN- 0.5u/hour 3:30AM-1.5  5AM- 1.4 6AM- 2.2 10AM- 0.750 11AM- 0.750 1PM-   1.6 3:30PM-1.9 4:30PM-1.8 8PM-     3.2 10PM-   1.8  Bolus CHO Ratio (1unit:CHO) MN- 1:12 3:30AM-1:12 5AM- 1:12 6AM- 1:12 10AM- 1:12 11AM- 1:5 1PM-   1:5 3:30PM-1:5 4:30PM-1:5.5 8PM-     1:5.5 10PM-   1:5.5   Correction/Sensitivity: MN- 1:25 3:30AM-1:25  5AM- 1:25 6AM- 1:25 10AM- 1:30 11AM- 1:25 1PM-   1:20 3:30PM-1:20 4:30PM-1:25 8PM-     1:25 10PM-   1:25  Target: 125  Active insulin time: 5 hours  -Metformin extended release 500 mg in the morning and 1000 mg in the evening.  Prior diabetic medications: Pioglitazone.  CONTINUOUS GLUCOSE MONITORING SYSTEM (CGMS) / INSULIN  PUMP INTERPRETATION:                         Tandem Pump & Sensor Download (Reviewed and summarized below.) Pump: Dexcom G7 and Tandem t:slim Dates: Last 14 to July 01, 2023 Average BG:  147   Glucose Management Indicator: 6.8% Sensor Average: 147 SD 43 CGM/Sensor usage:90%  Glycemic Trends:  <54: 0.2% 54-70: 0.9% 71-180: 78% 181-250: 19% 251-400: 1.8%   Average daily carbs entered: 71g Average total daily insulin:  60.33 units, Basal: 57%, Bolus: 43%,  (Manual Bolus: 49%,  Control IQ Bolus: 51%)   Control IQ Time in Use: 90%  Changing cartridge every  3.3  days.   Changing tubing every 3.3 days. Changing site/canuala every 3.3 days.    Trends:  Mostly acceptable blood sugar, overnight, in the morning and early afternoon.  She frequently had hyperglycemia up to blood sugar 200s with supper and had mild hyperglycemia at times with lunch.  Some of the hyperglycemia seems to be related with under carb count and other time probably related with late bolus.  Rare mild hypoglycemia, they are around before suppertime.   Hypoglycemia: Patient has minor hypoglycemic episodes. Patient has hypoglycemia awareness.    Factors modifying  glucose control: 1.  Diabetic diet assessment: 3 meals a day.  Usually eats outside on Friday evening and weekend  2.  Staying active or exercising: Walking regularly.  3.  Medication compliance: compliant all of the time.  Interval history 07/01/23 CGM/pump data as reviewed above.  Patient has been using Dexcom G7.  She has noticed connectivity issue and disconnection of her Dexcom to the pump.  She has contacted tandem pump representative.  Hemoglobin A1c recently 7% and GMI on the CGM 6.8%.  REVIEW OF SYSTEMS As per history of present illness.   PAST MEDICAL HISTORY: Past Medical History:  Diagnosis Date   Arthritis    OA   Atrial flutter (HCC)    a. s/p TEE/DCCV 08/16/13 (normal LV function, no LAA thrombus).b. s/p ablation by Dr Ladona Ridgel  09-23-2013   Coronary artery disease    2009 LAD Promus stent   Dysrhythmia    Aflutter, no issues after ablation 2014   Fibroid    Hyperlipidemia    Insulin pump in place    Osteopenia    Type 1 diabetes mellitus on insulin therapy (HCC)    Valvular heart disease    a. Mild MR/TR by TEE 08/2013.    PAST SURGICAL HISTORY: Past Surgical History:  Procedure Laterality Date   ABLATION  09-23-2013   CTI by Dr Ladona Ridgel   APPENDECTOMY     APPLICATION OF WOUND VAC Left 04/11/2017   Procedure: APPLICATION OF WOUND VAC LEFT KNEE;  Surgeon: Samson Frederic, MD;  Location: [facility] OR;  Service: Orthopedics;  Laterality: Left;   ATRIAL FLUTTER ABLATION N/A 09/23/2013   Procedure: ATRIAL FLUTTER ABLATION;  Surgeon: Marinus Maw, MD;  Location: [facility] CATH LAB;  Service: Cardiovascular;  Laterality: N/A;   CARDIOVERSION N/A 08/16/2013   Procedure: CARDIOVERSION;  Surgeon: Vesta Mixer, MD;  Location: [facility] ENDOSCOPY;  Service: Cardiovascular;  Laterality: N/A;  spoke with Tom   CATARACT EXTRACTION Bilateral    COLONOSCOPY     CORONARY ANGIOPLASTY WITH STENT PLACEMENT     MYOMECTOMY  1977   ORIF PATELLA Left 04/11/2017   ORIF PATELLA Left 04/11/2017   Procedure: OPEN REDUCTION INTERNAL (ORIF) FIXATION PATELLA LEFT KNEE;  Surgeon: Samson Frederic, MD;  Location: [facility] OR;  Service: Orthopedics;  Laterality: Left;   PELVIC LAPAROSCOPY     TEE WITHOUT CARDIOVERSION N/A 08/16/2013   Procedure: TRANSESOPHAGEAL ECHOCARDIOGRAM (TEE);  Surgeon: Vesta Mixer, MD;  Location: [facility] ENDOSCOPY;  Service: Cardiovascular;  Laterality: N/A;    ALLERGIES: Allergies  Allergen Reactions   Sulfa Antibiotics Rash   Sulfamethoxazole-Trimethoprim Rash    FAMILY HISTORY:  Family History  Problem Relation Age of Onset   CAD Father 88       died age 14   CAD Brother 30       small vessel disease   Atrial fibrillation Mother    Diabetes Mother    Hypertension Mother    CAD Cousin        paternal   CAD Paternal  Grandfather        early onset   Colon cancer Neg Hx     SOCIAL HISTORY: Social History   Socioeconomic History   Marital status: Married    Spouse name: [name]   Number of children: 2   Years of education: 14   Highest education level: Not on file  Occupational History   Occupation: home maker    Comment: Children adopted  Tobacco Use   Smoking status: Never  Smokeless tobacco: Never  Vaping Use   Vaping status: Never Used  Substance and Sexual Activity   Alcohol use: Yes    Alcohol/week: 5.0 standard drinks of alcohol    Types: 5 Glasses of wine per week    Comment: on weekends   Drug use: No   Sexual activity: Yes    Birth control/protection: Post-menopausal  Other Topics Concern   Not on file  Social History Narrative   Not on file   Social Determinants of Health   Financial Resource Strain: Low Risk  (09/10/2022)   Overall Financial Resource Strain (CARDIA)    Difficulty of Paying Living Expenses: Not hard at all  Food Insecurity: No Food Insecurity (09/10/2022)   Hunger Vital Sign    Worried About Running Out of Food in the Last Year: Never true    Ran Out of Food in the Last Year: Never true  Transportation Needs: No Transportation Needs (09/10/2022)   PRAPARE - Administrator, Civil Service (Medical): No    Lack of Transportation (Non-Medical): No  Physical Activity: Sufficiently Active (09/10/2022)   Exercise Vital Sign    Days of Exercise per Week: 4 days    Minutes of Exercise per Session: 40 min  Stress: No Stress Concern Present (09/10/2022)   [name] of Occupational Health - Occupational Stress Questionnaire    Feeling of Stress : Not at all  Social Connections: Socially Integrated (09/10/2022)   Social Connection and Isolation Panel [NHANES]    Frequency of Communication with Friends and Family: More than three times a week    Frequency of Social Gatherings with Friends and Family: More than three times a week    Attends Religious  Services: More than 4 times per year    Active Member of [employer] or Organizations: Yes    Attends Engineer, structural: More than 4 times per year    Marital Status: Married    MEDICATIONS:  Current Outpatient Medications  Medication Sig Dispense Refill   ASPIRIN 81 PO Take by mouth.     Calcium Carbonate-Vitamin D (CALCIUM + D PO) Take 600 mg by mouth 2 (two) times daily.     cholecalciferol (VITAMIN D) 1000 units tablet Take 1,000 Units by mouth 2 (two) times daily.      clopidogrel (PLAVIX) 75 MG tablet TAKE 1 TABLET DAILY (KEEP UPCOMING APPOINTMENT IN MARCH 2023 WITH DR. Elease Hashimoto BEFORE ANYMORE REFILLS) 90 tablet 2   Continuous Blood Gluc Sensor (DEXCOM G6 SENSOR) MISC by Does not apply route.     ezetimibe (ZETIA) 10 MG tablet TAKE 1 TABLET DAILY 90 tablet 1   gabapentin (NEURONTIN) 100 MG capsule Take 2 capsules (200 mg total) by mouth at bedtime. 180 capsule 1   ibuprofen (ADVIL) 200 MG tablet Take 200 mg by mouth every 6 (six) hours as needed.     losartan (COZAAR) 25 MG tablet Take 1 tablet (25 mg total) by mouth daily. 90 tablet 0   LYUMJEV 100 UNIT/ML SOLN USE A MAXIMUM OF 90 UNITS DAILY VIA INSULIN PUMP 80 mL 3   metFORMIN (GLUCOPHAGE-XR) 500 MG 24 hr tablet TAKE 1 TABLET IN THE MORNING AND 2 TABLETS IN THE EVENING AS DIRECTED 270 tablet 3   metoprolol succinate (TOPROL-XL) 100 MG 24 hr tablet TAKE 1 TABLET DAILY 90 tablet 3   nitroGLYCERIN (NITROSTAT) 0.4 MG SL tablet DISSOLVE 1 TABLET UNDER THE TONGUE EVERY 5 MINUTES AS NEEDED FOR CHEST PAIN 75 tablet 2  rosuvastatin (CRESTOR) 40 MG tablet TAKE 1 TABLET DAILY 90 tablet 3   solifenacin (VESICARE) 10 MG tablet Take 1 tablet (10 mg total) by mouth daily. 90 tablet 3   TART CHERRY PO Take by mouth.     Multiple Vitamins-Minerals (PRESERVISION AREDS 2 PO) Take by mouth in the morning and at bedtime. (Patient not taking: Reported on 07/01/2023)     nitrofurantoin, macrocrystal-monohydrate, (MACROBID) 100 MG capsule Take 1  capsule (100 mg total) by mouth 2 (two) times daily. (Patient not taking: Reported on 07/01/2023) 14 capsule 0   No current facility-administered medications for this visit.    PHYSICAL EXAM: Vitals:   07/01/23 0949  BP: 120/60  Pulse: 80  SpO2: 96%  Weight: 206 lb 9.6 oz (93.7 kg)  Height: 5\' 9"  (1.753 m)   Body mass index is 30.51 kg/m.  Wt Readings from Last 3 Encounters:  07/01/23 206 lb 9.6 oz (93.7 kg)  06/25/23 205 lb (93 kg)  04/04/23 209 lb 9.6 oz (95.1 kg)    General: Well developed, well nourished female in no apparent distress.  HEENT: AT/[place], no external lesions.  Eyes: Conjunctiva clear and no icterus. Neck: Neck supple  Lungs: Respirations not labored Neurologic: Alert, oriented, normal speech Extremities / Skin: Dry. No sores or rashes noted. No acanthosis nigricans Psychiatric: Does not appear depressed or anxious  Diabetic Foot Exam - Simple   No data filed    LABS Reviewed Lab Results  Component Value Date   HGBA1C 7.0 (H) 06/27/2023   HGBA1C 7.1 (H) 04/02/2023   HGBA1C 7.3 (H) 11/19/2022   Lab Results  Component Value Date   FRUCTOSAMINE 270 10/06/2019   FRUCTOSAMINE 292 (H) 02/04/2017   FRUCTOSAMINE 278 05/11/2013   Lab Results  Component Value Date   CHOL 107 04/02/2023   HDL 40.00 04/02/2023   LDLCALC 39 04/02/2023   LDLDIRECT 79.0 01/11/2015   TRIG 136.0 04/02/2023   CHOLHDL 3 04/02/2023   Lab Results  Component Value Date   MICRALBCREAT 1.2 07/11/2022   MICRALBCREAT 0.8 06/15/2021   Lab Results  Component Value Date   CREATININE 0.85 04/02/2023   Lab Results  Component Value Date   GFR 67.08 04/02/2023    ASSESSMENT / PLAN  1. Type 1 diabetes mellitus without complications (HCC)     Diabetes Mellitus type 1, complicated by no known complications.  - Diabetic status / severity: controlled.   Lab Results  Component Value Date   HGBA1C 7.0 (H) 06/27/2023    - Hemoglobin A1c goal <7%   - Medications:  Insulin  pump setting changed as follows:  Due to frequent hypoglycemia related with a supper insulin sensitivity is changed as follows.    Insulin Pump setting:  Basal MN- 0.5u/hour 3:30AM-1.5  5AM- 1.4 6AM- 2.2 10AM- 0.750 11AM- 0.750 1PM-   1.6 3:30PM-1.9 4:30PM-1.8 8PM-     3.2, changed to 2.0 10PM-   1.8  Bolus CHO Ratio (1unit:CHO) MN- 1:12 3:30AM-1:12 5AM- 1:12 6AM- 1:12 10AM- 1:12 11AM- 1:5 1PM-   1:5 3:30PM-1:5 4:30PM-1:5.5 8PM-     1:5.5 10PM-   1:5.5   Correction/Sensitivity: MN- 1:25 3:30AM-1:25  5AM- 1:25 6AM- 1:25 10AM- 1:30 11AM- 1:25 1PM-   1:20 3:30PM-1:20 4:30PM-1:25, changed to 1:20 8PM-     1:25 10PM-   1:25  Target: 125  Active insulin time: 5 hours  -Metformin extended release 500 mg in the morning and 1000 mg in the evening.  - Home glucose testing: continue CGM and  check blood glucose as needed.  - Discussed/ Gave Hypoglycemia treatment plan.  # Consult : not required at this time.   # Annual urine for microalbuminuria/ creatinine ratio, no microalbuminuria currently, continue ACE/ARB losartan. Last  Lab Results  Component Value Date   MICRALBCREAT 1.2 07/11/2022    # Foot check nightly.  # Annual dilated diabetic eye exams.   - Diet: Eat reasonable portion sizes to promote a healthy weight - Life style / activity / exercise: Discussed  2. Blood pressure  -  BP Readings from Last 1 Encounters:  07/01/23 120/60    - Control is in target.  - No change in current plans.  3. Lipid status / Hyperlipidemia - Last  Lab Results  Component Value Date   LDLCALC 39 04/02/2023   - Continue rosuvastatin 40 mg daily and Zetia 10 mg daily.  Diagnoses and all orders for this visit:  Type 1 diabetes mellitus without complications (HCC) -     Microalbumin / creatinine urine ratio; Future -     Microalbumin / creatinine urine ratio    DISPOSITION Follow up in clinic in 3 months suggested.   All questions answered and  patient verbalized understanding of the plan.  [place] Neshawn Aird, MD [facility] [address] [place], [address] [place], [place] [address] Phone # [phone]  At least part of this note was generated using voice recognition software. Inadvertent word errors may have occurred, which were not recognized during the proofreading process.

## 2023-07-02 ENCOUNTER — Encounter: Payer: Medicare Other | Admitting: Internal Medicine

## 2023-07-04 ENCOUNTER — Encounter: Payer: Self-pay | Admitting: Internal Medicine

## 2023-07-04 ENCOUNTER — Ambulatory Visit: Payer: Medicare Other | Admitting: Internal Medicine

## 2023-07-04 VITALS — BP 112/78 | HR 69 | Temp 97.7°F | Ht 69.0 in | Wt 207.2 lb

## 2023-07-04 DIAGNOSIS — E785 Hyperlipidemia, unspecified: Secondary | ICD-10-CM | POA: Diagnosis not present

## 2023-07-04 DIAGNOSIS — I83813 Varicose veins of bilateral lower extremities with pain: Secondary | ICD-10-CM

## 2023-07-04 DIAGNOSIS — I25119 Atherosclerotic heart disease of native coronary artery with unspecified angina pectoris: Secondary | ICD-10-CM

## 2023-07-04 DIAGNOSIS — I10 Essential (primary) hypertension: Secondary | ICD-10-CM

## 2023-07-04 DIAGNOSIS — E1069 Type 1 diabetes mellitus with other specified complication: Secondary | ICD-10-CM | POA: Diagnosis not present

## 2023-07-04 DIAGNOSIS — N393 Stress incontinence (female) (male): Secondary | ICD-10-CM

## 2023-07-04 DIAGNOSIS — E1169 Type 2 diabetes mellitus with other specified complication: Secondary | ICD-10-CM

## 2023-07-04 DIAGNOSIS — E108 Type 1 diabetes mellitus with unspecified complications: Secondary | ICD-10-CM

## 2023-07-04 NOTE — Patient Instructions (Signed)
We will get you into the vein clinic.

## 2023-07-04 NOTE — Assessment & Plan Note (Signed)
BP at goal on losartan 25 mg daily and metoprolol 100 mg daily and recent labs normal. Continue at same dosing.

## 2023-07-04 NOTE — Assessment & Plan Note (Signed)
No recent palpitations or episodes and taking metoprolol 100 mg daily. Is on aspirin and plavix for anticoagulation.

## 2023-07-04 NOTE — Assessment & Plan Note (Signed)
Taking vesicare 10 mg daily and well controlled. Continue.

## 2023-07-04 NOTE — Progress Notes (Signed)
   Subjective:   Patient ID: [name], female    DOB: 01/01/1946, 77 y.o.   MRN: [id number]  HPI The patient is a 77 YO female coming in for follow up and varicose veins with pain.  Review of Systems  Constitutional: Negative.   HENT: Negative.    Eyes: Negative.   Respiratory:  Negative for cough, chest tightness and shortness of breath.   Cardiovascular:  Negative for chest pain, palpitations and leg swelling.  Gastrointestinal:  Negative for abdominal distention, abdominal pain, constipation, diarrhea, nausea and vomiting.  Musculoskeletal: Negative.   Skin: Negative.        Varicose and spider veins  Neurological: Negative.   Psychiatric/Behavioral: Negative.      Objective:  Physical Exam Constitutional:      Appearance: She is well-developed.  HENT:     Head: Normocephalic and atraumatic.  Cardiovascular:     Rate and Rhythm: Normal rate and regular rhythm.  Pulmonary:     Effort: Pulmonary effort is normal. No respiratory distress.     Breath sounds: Normal breath sounds. No wheezing or rales.  Abdominal:     General: Bowel sounds are normal. There is no distension.     Palpations: Abdomen is soft.     Tenderness: There is no abdominal tenderness. There is no rebound.  Musculoskeletal:     Cervical back: Normal range of motion.  Skin:    General: Skin is warm and dry.     Comments: Varicose and spider veins both legs, foot exam done  Neurological:     Mental Status: She is alert and oriented to person, place, and time.     Coordination: Coordination normal.     Vitals:   07/04/23 1420  BP: 112/78  Pulse: 69  Temp: 97.7 F (36.5 C)  TempSrc: Oral  SpO2: 95%  Weight: 207 lb 4 oz (94 kg)  Height: 5\' 9"  (1.753 m)    Assessment & Plan:

## 2023-07-04 NOTE — Assessment & Plan Note (Signed)
Foot exam done and controlled. Seeing endo for insulin management for pump. She is on ARB and statin.

## 2023-07-04 NOTE — Assessment & Plan Note (Signed)
Recent lipid panel at goal on crestor 40 mg daily and zetia 10 mg daily.

## 2023-07-04 NOTE — Assessment & Plan Note (Signed)
Referral to vascular surgery to help with this and discussed likely course.

## 2023-07-04 NOTE — Assessment & Plan Note (Signed)
Taking crestor 40 mg daily and on aspirin and plavix. Will continue.

## 2023-07-08 ENCOUNTER — Other Ambulatory Visit: Payer: Self-pay | Admitting: Endocrinology

## 2023-07-08 DIAGNOSIS — I10 Essential (primary) hypertension: Secondary | ICD-10-CM

## 2023-07-17 ENCOUNTER — Ambulatory Visit (INDEPENDENT_AMBULATORY_CARE_PROVIDER_SITE_OTHER): Payer: Medicare Other | Admitting: Vascular Surgery

## 2023-07-17 ENCOUNTER — Encounter: Payer: Self-pay | Admitting: Vascular Surgery

## 2023-07-17 VITALS — BP 135/77 | HR 70 | Temp 97.8°F | Resp 18 | Ht 69.0 in | Wt 205.6 lb

## 2023-07-17 DIAGNOSIS — I83813 Varicose veins of bilateral lower extremities with pain: Secondary | ICD-10-CM

## 2023-07-17 DIAGNOSIS — I8393 Asymptomatic varicose veins of bilateral lower extremities: Secondary | ICD-10-CM

## 2023-07-17 NOTE — Progress Notes (Signed)
Patient ID: [name], female   DOB: [birth year], 77 y.o.   MRN: [id number]  Reason for Consult: New Patient (Initial Visit) and Varicose Veins (Referred by Hillard Danker MD    VV right LE painful especially after prolonged standing )   Referred by Myrlene Broker, *  Subjective:     HPI:  [name] is a 77 y.o. female history of plating of her left lower extremity secondary to a fall a couple years ago.  She does not have any history of lower extremity DVT.  She does not recall her parents having varicosities but her mother did have telangiectasias and she also has them.  She has painful varicosities in the right lower extremity.  She has never had any venous interventions.  She does not wear compression stockings.  She maybe has mild swelling bilaterally.  Past Medical History:  Diagnosis Date   Arthritis    OA   Atrial flutter (HCC)    a. s/p TEE/DCCV 08/16/13 (normal LV function, no LAA thrombus).b. s/p ablation by Dr Ladona Ridgel 09-23-2013   Coronary artery disease    2009 LAD Promus stent   Dysrhythmia    Aflutter, no issues after ablation 2014   Fibroid    Hyperlipidemia    Insulin pump in place    Osteopenia    Type 1 diabetes mellitus on insulin therapy (HCC)    Valvular heart disease    a. Mild MR/TR by TEE 08/2013.   Family History  Problem Relation Age of Onset   CAD Father 70       died age 16   CAD Brother 89       small vessel disease   Atrial fibrillation Mother    Diabetes Mother    Hypertension Mother    CAD Cousin        paternal   CAD Paternal Grandfather        early onset   Colon cancer Neg Hx    Past Surgical History:  Procedure Laterality Date   ABLATION  09-23-2013   CTI by Dr Ladona Ridgel   APPENDECTOMY     APPLICATION OF WOUND VAC Left 04/11/2017   Procedure: APPLICATION OF WOUND VAC LEFT KNEE;  Surgeon: Samson Frederic, MD;  Location: [facility] OR;  Service: Orthopedics;  Laterality: Left;   ATRIAL FLUTTER ABLATION N/A 09/23/2013    Procedure: ATRIAL FLUTTER ABLATION;  Surgeon: Marinus Maw, MD;  Location[facility] CATH LAB;  Service: Cardiovascular;  Laterality: N/A;   CARDIOVERSION N/A 08/16/2013   Procedure: CARDIOVERSION;  Surgeon: Vesta Mixer, MD;  Location: [facility] ENDOSCOPY;  Service: Cardiovascular;  Laterality: N/A;  spoke with Tom   CATARACT EXTRACTION Bilateral    COLONOSCOPY     CORONARY ANGIOPLASTY WITH STENT PLACEMENT     MYOMECTOMY  1977   ORIF PATELLA Left 04/11/2017   ORIF PATELLA Left 04/11/2017   Procedure: OPEN REDUCTION INTERNAL (ORIF) FIXATION PATELLA LEFT KNEE;  Surgeon: Samson Frederic, MD;  Location: [facility] OR;  Service: Orthopedics;  Laterality: Left;   PELVIC LAPAROSCOPY     TEE WITHOUT CARDIOVERSION N/A 08/16/2013   Procedure: TRANSESOPHAGEAL ECHOCARDIOGRAM (TEE);  Surgeon: Vesta Mixer, MD;  Location: [facility] ENDOSCOPY;  Service: Cardiovascular;  Laterality: N/A;    Short Social History:  Social History   Tobacco Use   Smoking status: Never   Smokeless tobacco: Never  Substance Use Topics   Alcohol use: Yes    Alcohol/week: 5.0 standard drinks of alcohol  Types: 5 Glasses of wine per week    Comment: on weekends    Allergies  Allergen Reactions   Sulfa Antibiotics Rash   Sulfamethoxazole-Trimethoprim Rash    Current Outpatient Medications  Medication Sig Dispense Refill   ASPIRIN 81 PO Take by mouth.     Calcium Carbonate-Vitamin D (CALCIUM + D PO) Take 600 mg by mouth 2 (two) times daily.     cholecalciferol (VITAMIN D) 1000 units tablet Take 1,000 Units by mouth 2 (two) times daily.      clopidogrel (PLAVIX) 75 MG tablet TAKE 1 TABLET DAILY (KEEP UPCOMING APPOINTMENT IN MARCH 2023 WITH DR. Elease Hashimoto BEFORE ANYMORE REFILLS) 90 tablet 2   Continuous Blood Gluc Sensor (DEXCOM G6 SENSOR) MISC by Does not apply route.     ezetimibe (ZETIA) 10 MG tablet TAKE 1 TABLET DAILY 90 tablet 1   gabapentin (NEURONTIN) 100 MG capsule Take 2 capsules (200 mg total) by mouth at bedtime. 180 capsule 1    ibuprofen (ADVIL) 200 MG tablet Take 200 mg by mouth every 6 (six) hours as needed.     losartan (COZAAR) 25 MG tablet TAKE 1 TABLET DAILY 90 tablet 3   LYUMJEV 100 UNIT/ML SOLN USE A MAXIMUM OF 90 UNITS DAILY VIA INSULIN PUMP 80 mL 3   metFORMIN (GLUCOPHAGE-XR) 500 MG 24 hr tablet TAKE 1 TABLET IN THE MORNING AND 2 TABLETS IN THE EVENING AS DIRECTED 270 tablet 3   metoprolol succinate (TOPROL-XL) 100 MG 24 hr tablet TAKE 1 TABLET DAILY 90 tablet 3   Multiple Vitamins-Minerals (PRESERVISION AREDS 2 PO) Take by mouth in the morning and at bedtime.     nitroGLYCERIN (NITROSTAT) 0.4 MG SL tablet DISSOLVE 1 TABLET UNDER THE TONGUE EVERY 5 MINUTES AS NEEDED FOR CHEST PAIN 75 tablet 2   rosuvastatin (CRESTOR) 40 MG tablet TAKE 1 TABLET DAILY 90 tablet 3   solifenacin (VESICARE) 10 MG tablet Take 1 tablet (10 mg total) by mouth daily. 90 tablet 3   TART CHERRY PO Take by mouth.     No current facility-administered medications for this visit.    Review of Systems  Constitutional:  Constitutional negative. HENT: HENT negative.  Eyes: Eyes negative.  Respiratory: Respiratory negative.  Cardiovascular: Positive for leg swelling.  Musculoskeletal: Positive for leg pain and joint pain.  Skin: Skin negative.  Neurological: Neurological negative. Hematologic: Hematologic/lymphatic negative.  Psychiatric: Psychiatric negative.        Objective:  Objective   Vitals:   07/17/23 1449  BP: 135/77  Pulse: 70  Resp: 18  Temp: 97.8 F (36.6 C)  TempSrc: Temporal  SpO2: 96%  Weight: 205 lb 9.6 oz (93.3 kg)  Height: 5\' 9"  (1.753 m)   Body mass index is 30.36 kg/m.  Physical Exam HENT:     Head: Normocephalic.     Nose: Nose normal.  Eyes:     Pupils: Pupils are equal, round, and reactive to light.  Cardiovascular:     Rate and Rhythm: Normal rate.     Pulses: Normal pulses.  Pulmonary:     Effort: Pulmonary effort is normal.  Abdominal:     General: Abdomen is flat.     Palpations:  Abdomen is soft.  Musculoskeletal:     Cervical back: Normal range of motion.     Right lower leg: No edema.     Left lower leg: No edema.  Skin:    General: Skin is warm.     Capillary Refill: Capillary refill takes  less than 2 seconds.     Comments: Right lower extremity has a very large varicosity from the medial thigh coursing over the knee and varicosities below the knee on the medial leg with multiple telangiectasias of the bilateral lower extremities.  No frank varicosities on the left lower extremity.  Neurological:     General: No focal deficit present.     Mental Status: She is alert.  Psychiatric:        Mood and Affect: Mood normal.        Thought Content: Thought content normal.        Judgment: Judgment normal.     Data: No studies today     Assessment/Plan:     77 year old female with history of symptomatic varicose veins of the right greater than left lower extremity with associated telangiectasias and mild edema consistent with C3 venous disease.  We have fitted her for thigh-high compression stockings we will follow-up in 3 months with right lower extremity venous reflux testing.  We discussed the need for compression stockings when she is out of bed during the day and for elevation when she is recumbent.  We discussed the nonlimb threatening nature of venous disease at this level and she demonstrates good understanding.  Plan follow-up in 3 months with reflux testing right lower extremity which is the most symptomatic side.     Maeola Harman MD [facility]

## 2023-07-23 ENCOUNTER — Other Ambulatory Visit: Payer: Self-pay

## 2023-07-23 DIAGNOSIS — I83813 Varicose veins of bilateral lower extremities with pain: Secondary | ICD-10-CM

## 2023-07-29 ENCOUNTER — Encounter: Payer: Self-pay | Admitting: Internal Medicine

## 2023-07-30 ENCOUNTER — Encounter: Payer: Self-pay | Admitting: Internal Medicine

## 2023-07-30 DIAGNOSIS — Z23 Encounter for immunization: Secondary | ICD-10-CM | POA: Diagnosis not present

## 2023-07-31 NOTE — Telephone Encounter (Signed)
I have updated shot records with flu and Covid please advise about the pneumonia vaccine

## 2023-08-03 ENCOUNTER — Encounter: Payer: Self-pay | Admitting: Family Medicine

## 2023-08-06 ENCOUNTER — Encounter: Payer: Self-pay | Admitting: Family Medicine

## 2023-08-06 ENCOUNTER — Other Ambulatory Visit: Payer: Self-pay

## 2023-08-06 ENCOUNTER — Ambulatory Visit: Payer: Medicare Other | Admitting: Family Medicine

## 2023-08-06 VITALS — BP 116/62 | HR 71 | Ht 69.0 in | Wt 206.0 lb

## 2023-08-06 DIAGNOSIS — S8991XA Unspecified injury of right lower leg, initial encounter: Secondary | ICD-10-CM | POA: Diagnosis not present

## 2023-08-06 DIAGNOSIS — S86111A Strain of other muscle(s) and tendon(s) of posterior muscle group at lower leg level, right leg, initial encounter: Secondary | ICD-10-CM | POA: Insufficient documentation

## 2023-08-06 NOTE — Progress Notes (Signed)
[facility] [address] [place] [address] Phone: [phone] Subjective:   INadine Counts, am serving as a scribe for Dr. Antoine Primas.  I'm seeing this patient by the request  of:  Myrlene Broker, MD  CC: knee pain   [id number]  [name] is a 77 y.o. female coming in with complaint of knee pain. Patient states R knee pain. Saturday stepped down off curb and heard a snap. Didn't fall. Has been using walker up until today. Seems to be getting better. Little bit of icing and taking advil. That has helped.       Past Medical History:  Diagnosis Date   Arthritis    OA   Atrial flutter (HCC)    a. s/p TEE/DCCV 08/16/13 (normal LV function, no LAA thrombus).b. s/p ablation by Dr Ladona Ridgel 09-23-2013   Coronary artery disease    2009 LAD Promus stent   Dysrhythmia    Aflutter, no issues after ablation 2014   Fibroid    Hyperlipidemia    Insulin pump in place    Osteopenia    Type 1 diabetes mellitus on insulin therapy (HCC)    Valvular heart disease    a. Mild MR/TR by TEE 08/2013.   Past Surgical History:  Procedure Laterality Date   ABLATION  09-23-2013   CTI by Dr Ladona Ridgel   APPENDECTOMY     APPLICATION OF WOUND VAC Left 04/11/2017   Procedure: APPLICATION OF WOUND VAC LEFT KNEE;  Surgeon: Samson Frederic, MD;  Location: [facility] OR;  Service: Orthopedics;  Laterality: Left;   ATRIAL FLUTTER ABLATION N/A 09/23/2013   Procedure: ATRIAL FLUTTER ABLATION;  Surgeon: Marinus Maw, MD;  Location: [facility] CATH LAB;  Service: Cardiovascular;  Laterality: N/A;   CARDIOVERSION N/A 08/16/2013   Procedure: CARDIOVERSION;  Surgeon: Vesta Mixer, MD;  Location: [facility] ENDOSCOPY;  Service: Cardiovascular;  Laterality: N/A;  spoke with Tom   CATARACT EXTRACTION Bilateral    COLONOSCOPY     CORONARY ANGIOPLASTY WITH STENT PLACEMENT     MYOMECTOMY  1977   ORIF PATELLA Left 04/11/2017   ORIF PATELLA Left 04/11/2017   Procedure: OPEN REDUCTION  INTERNAL (ORIF) FIXATION PATELLA LEFT KNEE;  Surgeon: Samson Frederic, MD;  Location: [facility] OR;  Service: Orthopedics;  Laterality: Left;   PELVIC LAPAROSCOPY     TEE WITHOUT CARDIOVERSION N/A 08/16/2013   Procedure: TRANSESOPHAGEAL ECHOCARDIOGRAM (TEE);  Surgeon: Vesta Mixer, MD;  Location: [facility] ENDOSCOPY;  Service: Cardiovascular;  Laterality: N/A;   Social History   Socioeconomic History   Marital status: Married    Spouse name: [name]   Number of children: 2   Years of education: 14   Highest education level: Bachelor's degree (e.g., BA, AB, BS)  Occupational History   Occupation: home maker    Comment: Children adopted  Tobacco Use   Smoking status: Never   Smokeless tobacco: Never  Vaping Use   Vaping status: Never Used  Substance and Sexual Activity   Alcohol use: Yes    Alcohol/week: 5.0 standard drinks of alcohol    Types: 5 Glasses of wine per week    Comment: on weekends   Drug use: No   Sexual activity: Yes    Birth control/protection: Post-menopausal  Other Topics Concern   Not on file  Social History Narrative   Not on file   Social Determinants of Health   Financial Resource Strain: Low Risk  (07/03/2023)   Overall Physicist, medical Strain (  CARDIA)    Difficulty of Paying Living Expenses: Not hard at all  Food Insecurity: No Food Insecurity (07/03/2023)   Hunger Vital Sign    Worried About Running Out of Food in the Last Year: Never true    Ran Out of Food in the Last Year: Never true  Transportation Needs: No Transportation Needs (07/03/2023)   PRAPARE - Administrator, Civil Service (Medical): No    Lack of Transportation (Non-Medical): No  Physical Activity: Insufficiently Active (07/03/2023)   Exercise Vital Sign    Days of Exercise per Week: 4 days    Minutes of Exercise per Session: 20 min  Stress: No Stress Concern Present (07/03/2023)   [name] of Occupational Health - Occupational Stress Questionnaire    Feeling of Stress :  Only a little  Social Connections: Socially Integrated (07/03/2023)   Social Connection and Isolation Panel [NHANES]    Frequency of Communication with Friends and Family: More than three times a week    Frequency of Social Gatherings with Friends and Family: More than three times a week    Attends Religious Services: More than 4 times per year    Active Member of [employer] or Organizations: Yes    Attends Engineer, structural: More than 4 times per year    Marital Status: Married   Allergies  Allergen Reactions   Sulfa Antibiotics Rash   Sulfamethoxazole-Trimethoprim Rash   Family History  Problem Relation Age of Onset   CAD Father 36       died age 54   CAD Brother 30       small vessel disease   Atrial fibrillation Mother    Diabetes Mother    Hypertension Mother    CAD Cousin        paternal   CAD Paternal Grandfather        early onset   Colon cancer Neg Hx     Current Outpatient Medications (Endocrine & Metabolic):    LYUMJEV 100 UNIT/ML SOLN, USE A MAXIMUM OF 90 UNITS DAILY VIA INSULIN PUMP   metFORMIN (GLUCOPHAGE-XR) 500 MG 24 hr tablet, TAKE 1 TABLET IN THE MORNING AND 2 TABLETS IN THE EVENING AS DIRECTED  Current Outpatient Medications (Cardiovascular):    ezetimibe (ZETIA) 10 MG tablet, TAKE 1 TABLET DAILY   losartan (COZAAR) 25 MG tablet, TAKE 1 TABLET DAILY   metoprolol succinate (TOPROL-XL) 100 MG 24 hr tablet, TAKE 1 TABLET DAILY   nitroGLYCERIN (NITROSTAT) 0.4 MG SL tablet, DISSOLVE 1 TABLET UNDER THE TONGUE EVERY 5 MINUTES AS NEEDED FOR CHEST PAIN   rosuvastatin (CRESTOR) 40 MG tablet, TAKE 1 TABLET DAILY   Current Outpatient Medications (Analgesics):    ASPIRIN 81 PO, Take by mouth.   ibuprofen (ADVIL) 200 MG tablet, Take 200 mg by mouth every 6 (six) hours as needed.  Current Outpatient Medications (Hematological):    clopidogrel (PLAVIX) 75 MG tablet, TAKE 1 TABLET DAILY (KEEP UPCOMING APPOINTMENT IN MARCH 2023 WITH DR. Elease Hashimoto BEFORE ANYMORE  REFILLS)  Current Outpatient Medications (Other):    Calcium Carbonate-Vitamin D (CALCIUM + D PO), Take 600 mg by mouth 2 (two) times daily.   cholecalciferol (VITAMIN D) 1000 units tablet, Take 1,000 Units by mouth 2 (two) times daily.    Continuous Blood Gluc Sensor (DEXCOM G6 SENSOR) MISC, by Does not apply route.   gabapentin (NEURONTIN) 100 MG capsule, Take 2 capsules (200 mg total) by mouth at bedtime.   Multiple Vitamins-Minerals (PRESERVISION AREDS 2  PO), Take by mouth in the morning and at bedtime.   solifenacin (VESICARE) 10 MG tablet, Take 1 tablet (10 mg total) by mouth daily.   TART CHERRY PO, Take by mouth.   Reviewed prior external information including notes and imaging from  primary care provider As well as notes that were available from care everywhere and other healthcare systems.  Past medical history, social, surgical and family history all reviewed in electronic medical record.  No pertanent information unless stated regarding to the chief complaint.   Review of Systems:  No headache, visual changes, nausea, vomiting, diarrhea, constipation, dizziness, abdominal pain, skin rash, fevers, chills, night sweats, weight loss, swollen lymph nodes, body aches, joint swelling, chest pain, shortness of breath, mood changes. POSITIVE muscle aches  Objective  Blood pressure 116/62, pulse 71, height 5\' 9"  (1.753 m), weight 206 lb (93.4 kg), SpO2 97%.   General: No apparent distress alert and oriented x3 mood and affect normal, dressed appropriately.  HEENT: Pupils equal, extraocular movements intact  Respiratory: Patient's speak in full sentences and does not appear short of breath  Cardiovascular: No lower extremity edema, non tender, no erythema  Knee exam shows trace effusion noted but patient does have good range of motion.  Tender to palpation in the medial gastroc head noted.  Does have swelling in the medial gastroc comparatively to the contralateral side.  Does have  tenderness at the most proximal aspect of this area.  Neurovascularly intact distally.  Severely antalgic gait  Limited muscular skeletal ultrasound was performed and interpreted by Antoine Primas, M  Limited ultrasound shows the patient does have a likely tear noted just 1 cm from the muscular tendon junction approximately of the medial gastroc head.  No significant retraction but significant hypoechoic changes.  Mild increase in neovascularization. Impression: Gastrocnemius tear    Impression and Recommendations:    The above documentation has been reviewed and is accurate and complete Judi Saa, DO

## 2023-08-06 NOTE — Patient Instructions (Signed)
Heel lifts Icing every 4 hours 3 ibuprofen 3x a day for the next 3 days See you again in 4 weeks

## 2023-08-06 NOTE — Assessment & Plan Note (Signed)
Tear of the medial gastroc head was noted today.  Discussed with patient about heel lift, compression, icing regimen and a short course of anti-inflammatories.  Discussed range of motion exercises and patient will improve fairly significantly I would believe it is.  Patient is going to follow-up with me in 4 weeks otherwise.

## 2023-08-08 ENCOUNTER — Ambulatory Visit: Payer: Medicare Other | Admitting: Family Medicine

## 2023-08-10 ENCOUNTER — Encounter: Payer: Self-pay | Admitting: Family Medicine

## 2023-08-11 ENCOUNTER — Other Ambulatory Visit: Payer: Self-pay

## 2023-08-11 ENCOUNTER — Ambulatory Visit (INDEPENDENT_AMBULATORY_CARE_PROVIDER_SITE_OTHER): Payer: Medicare Other

## 2023-08-11 DIAGNOSIS — G8929 Other chronic pain: Secondary | ICD-10-CM

## 2023-08-11 DIAGNOSIS — M25561 Pain in right knee: Secondary | ICD-10-CM

## 2023-08-11 DIAGNOSIS — M1711 Unilateral primary osteoarthritis, right knee: Secondary | ICD-10-CM | POA: Diagnosis not present

## 2023-08-19 ENCOUNTER — Encounter: Payer: Self-pay | Admitting: Family Medicine

## 2023-08-19 ENCOUNTER — Other Ambulatory Visit: Payer: Self-pay

## 2023-08-19 DIAGNOSIS — G8929 Other chronic pain: Secondary | ICD-10-CM

## 2023-08-21 ENCOUNTER — Encounter: Payer: Self-pay | Admitting: Internal Medicine

## 2023-08-21 NOTE — Telephone Encounter (Signed)
Drug store has been added to preferred pharmacy

## 2023-08-22 ENCOUNTER — Encounter: Payer: Self-pay | Admitting: Endocrinology

## 2023-08-22 ENCOUNTER — Telehealth: Payer: Self-pay

## 2023-08-22 NOTE — Telephone Encounter (Signed)
Request for insulin pump cartridges and supplies sent to [facility] through [place] on 08/22/2023, patient made aware via MyChart

## 2023-08-25 ENCOUNTER — Encounter: Payer: Self-pay | Admitting: Family Medicine

## 2023-08-25 ENCOUNTER — Other Ambulatory Visit: Payer: Self-pay

## 2023-08-25 ENCOUNTER — Ambulatory Visit
Admission: RE | Admit: 2023-08-25 | Discharge: 2023-08-25 | Disposition: A | Discharge status: Home / Self Care | Payer: Medicare Other | Source: Ambulatory Visit | Attending: Family Medicine | Admitting: Family Medicine

## 2023-08-25 DIAGNOSIS — M23321 Other meniscus derangements, posterior horn of medial meniscus, right knee: Secondary | ICD-10-CM | POA: Diagnosis not present

## 2023-08-25 DIAGNOSIS — M25551 Pain in right hip: Secondary | ICD-10-CM | POA: Diagnosis not present

## 2023-08-25 DIAGNOSIS — M25561 Pain in right knee: Secondary | ICD-10-CM

## 2023-08-25 DIAGNOSIS — G8929 Other chronic pain: Secondary | ICD-10-CM | POA: Diagnosis not present

## 2023-08-25 DIAGNOSIS — S82144A Nondisplaced bicondylar fracture of right tibia, initial encounter for closed fracture: Secondary | ICD-10-CM | POA: Diagnosis not present

## 2023-08-25 MED ORDER — TRAMADOL HCL 50 MG PO TABS
50.0000 mg | ORAL_TABLET | Freq: Three times a day (TID) | ORAL | 0 refills | Status: AC | PRN
Start: 2023-08-25 — End: 2023-08-30

## 2023-08-26 ENCOUNTER — Encounter: Payer: Self-pay | Admitting: Family Medicine

## 2023-08-31 ENCOUNTER — Encounter: Payer: Self-pay | Admitting: Family Medicine

## 2023-09-02 ENCOUNTER — Other Ambulatory Visit (INDEPENDENT_AMBULATORY_CARE_PROVIDER_SITE_OTHER): Payer: Medicare Other

## 2023-09-02 ENCOUNTER — Ambulatory Visit (INDEPENDENT_AMBULATORY_CARE_PROVIDER_SITE_OTHER)
Admission: RE | Admit: 2023-09-02 | Discharge: 2023-09-02 | Disposition: A | Discharge status: Home / Self Care | Payer: Medicare Other | Source: Ambulatory Visit | Attending: Family Medicine | Admitting: Family Medicine

## 2023-09-02 ENCOUNTER — Ambulatory Visit (INDEPENDENT_AMBULATORY_CARE_PROVIDER_SITE_OTHER): Payer: Medicare Other | Admitting: Family Medicine

## 2023-09-02 VITALS — BP 118/72 | HR 74 | Ht 69.0 in | Wt 200.0 lb

## 2023-09-02 DIAGNOSIS — S83281A Other tear of lateral meniscus, current injury, right knee, initial encounter: Secondary | ICD-10-CM

## 2023-09-02 DIAGNOSIS — G8929 Other chronic pain: Secondary | ICD-10-CM | POA: Diagnosis not present

## 2023-09-02 DIAGNOSIS — M8468XA Pathological fracture in other disease, other site, initial encounter for fracture: Secondary | ICD-10-CM

## 2023-09-02 DIAGNOSIS — E559 Vitamin D deficiency, unspecified: Secondary | ICD-10-CM | POA: Diagnosis not present

## 2023-09-02 DIAGNOSIS — E038 Other specified hypothyroidism: Secondary | ICD-10-CM

## 2023-09-02 DIAGNOSIS — M2550 Pain in unspecified joint: Secondary | ICD-10-CM

## 2023-09-02 DIAGNOSIS — M25561 Pain in right knee: Secondary | ICD-10-CM

## 2023-09-02 LAB — FERRITIN: Ferritin: 40.4 ng/mL (ref 10.0–291.0)

## 2023-09-02 LAB — IBC PANEL
Iron: 68 ug/dL (ref 42–145)
Saturation Ratios: 19.9 % — ABNORMAL LOW (ref 20.0–50.0)
TIBC: 341.6 ug/dL (ref 250.0–450.0)
Transferrin: 244 mg/dL (ref 212.0–360.0)

## 2023-09-02 LAB — SEDIMENTATION RATE: Sed Rate: 18 mm/h (ref 0–30)

## 2023-09-02 LAB — TSH: TSH: 1.71 u[IU]/mL (ref 0.35–5.50)

## 2023-09-02 LAB — VITAMIN D 25 HYDROXY (VIT D DEFICIENCY, FRACTURES): VITD: 51.91 ng/mL (ref 30.00–100.00)

## 2023-09-02 LAB — VITAMIN B12: Vitamin B-12: 187 pg/mL — ABNORMAL LOW (ref 211–911)

## 2023-09-02 LAB — URIC ACID: Uric Acid, Serum: 4.3 mg/dL (ref 2.4–7.0)

## 2023-09-02 MED ORDER — TRAMADOL HCL 50 MG PO TABS
50.0000 mg | ORAL_TABLET | Freq: Three times a day (TID) | ORAL | 0 refills | Status: AC | PRN
Start: 2023-09-02 — End: 2023-09-07

## 2023-09-02 NOTE — Patient Instructions (Signed)
Dr. Luiz Blare- [facility] We are here if you have any questions

## 2023-09-02 NOTE — Progress Notes (Signed)
[name] [address] [place] [address] Phone: [phone] Subjective:   [name], am serving as a scribe for Dr. Antoine Primas.  I'm seeing this patient by the request  of:  Audrey Broker, MD  CC: Right knee pain follow-up  [id number]  08/06/2023       Tear of the medial gastroc head was noted today.  Discussed with patient about heel lift, compression, icing regimen and a short course of anti-inflammatories.  Discussed range of motion exercises and patient will improve fairly significantly I would believe it is.  Patient is going to follow-up with me in 4 weeks otherwise.     Update 09/02/2023 [name] is a 77 y.o. female coming in with complaint of R knee pain. Patient states that her pain is constant and hurts like "crazy." Weightbearing increases her pain. Pain worse over anetiormedial aspect.   MRI R knee 08/25/2023  IMPRESSION: 1. Complete radial tear through the root of the posterior horn of the medial meniscus. 2. Horizontal tear posterior horn lateral meniscus. The body and anterior horn of the lateral meniscus are degenerated and diminutive with complex tearing throughout. 3. Nondisplaced fracture of the subchondral bone plate of the medial tibial plateau is likely an insufficiency fracture. 4. Mild to moderate lateral compartment osteoarthritis.     Past Medical History:  Diagnosis Date   Arthritis    OA   Atrial flutter (HCC)    a. s/p TEE/DCCV 08/16/13 (normal LV function, no LAA thrombus).b. s/p ablation by Dr Ladona Ridgel 09-23-2013   Coronary artery disease    2009 LAD Promus stent   Dysrhythmia    Aflutter, no issues after ablation 2014   Fibroid    Hyperlipidemia    Insulin pump in place    Osteopenia    Type 1 diabetes mellitus on insulin therapy (HCC)    Valvular heart disease    a. Mild MR/TR by TEE 08/2013.   Past Surgical History:  Procedure Laterality Date   ABLATION   09-23-2013   CTI by Dr Ladona Ridgel   APPENDECTOMY     APPLICATION OF WOUND VAC Left 04/11/2017   Procedure: APPLICATION OF WOUND VAC LEFT KNEE;  Surgeon: Samson Frederic, MD;  Location: [facility] OR;  Service: Orthopedics;  Laterality: Left;   ATRIAL FLUTTER ABLATION N/A 09/23/2013   Procedure: ATRIAL FLUTTER ABLATION;  Surgeon: Marinus Maw, MD;  Location: [facility] CATH LAB;  Service: Cardiovascular;  Laterality: N/A;   CARDIOVERSION N/A 08/16/2013   Procedure: CARDIOVERSION;  Surgeon: Vesta Mixer, MD;  Location: [facility] ENDOSCOPY;  Service: Cardiovascular;  Laterality: N/A;  spoke with Tom   CATARACT EXTRACTION Bilateral    COLONOSCOPY     CORONARY ANGIOPLASTY WITH STENT PLACEMENT     MYOMECTOMY  1977   ORIF PATELLA Left 04/11/2017   ORIF PATELLA Left 04/11/2017   Procedure: OPEN REDUCTION INTERNAL (ORIF) FIXATION PATELLA LEFT KNEE;  Surgeon: Samson Frederic, MD;  Location: [facility] OR;  Service: Orthopedics;  Laterality: Left;   PELVIC LAPAROSCOPY     TEE WITHOUT CARDIOVERSION N/A 08/16/2013   Procedure: TRANSESOPHAGEAL ECHOCARDIOGRAM (TEE);  Surgeon: Vesta Mixer, MD;  Location: [facility] ENDOSCOPY;  Service: Cardiovascular;  Laterality: N/A;   Social History   Socioeconomic History   Marital status: Married    Spouse name: [name]   Number of children: 2   Years of education: 14   Highest education level: Bachelor's degree (e.g., BA, AB, BS)  Occupational History  Occupation: Arts development officer    Comment: Children adopted  Tobacco Use   Smoking status: Never   Smokeless tobacco: Never  Vaping Use   Vaping status: Never Used  Substance and Sexual Activity   Alcohol use: Yes    Alcohol/week: 5.0 standard drinks of alcohol    Types: 5 Glasses of wine per week    Comment: on weekends   Drug use: No   Sexual activity: Yes    Birth control/protection: Post-menopausal  Other Topics Concern   Not on file  Social History Narrative   Not on file   Social Determinants of Health   Financial Resource Strain: Low  Risk  (07/03/2023)   Overall Financial Resource Strain (CARDIA)    Difficulty of Paying Living Expenses: Not hard at all  Food Insecurity: No Food Insecurity (07/03/2023)   Hunger Vital Sign    Worried About Running Out of Food in the Last Year: Never true    Ran Out of Food in the Last Year: Never true  Transportation Needs: No Transportation Needs (07/03/2023)   PRAPARE - Administrator, Civil Service (Medical): No    Lack of Transportation (Non-Medical): No  Physical Activity: Insufficiently Active (07/03/2023)   Exercise Vital Sign    Days of Exercise per Week: 4 days    Minutes of Exercise per Session: 20 min  Stress: No Stress Concern Present (07/03/2023)   [name] of Occupational Health - Occupational Stress Questionnaire    Feeling of Stress : Only a little  Social Connections: Socially Integrated (07/03/2023)   Social Connection and Isolation Panel [NHANES]    Frequency of Communication with Friends and Family: More than three times a week    Frequency of Social Gatherings with Friends and Family: More than three times a week    Attends Religious Services: More than 4 times per year    Active Member of [employer] or Organizations: Yes    Attends Engineer, structural: More than 4 times per year    Marital Status: Married   Allergies  Allergen Reactions   Sulfa Antibiotics Rash   Sulfamethoxazole-Trimethoprim Rash   Family History  Problem Relation Age of Onset   CAD Father 4       died age 73   CAD Brother 30       small vessel disease   Atrial fibrillation Mother    Diabetes Mother    Hypertension Mother    CAD Cousin        paternal   CAD Paternal Grandfather        early onset   Colon cancer Neg Hx     Current Outpatient Medications (Endocrine & Metabolic):    LYUMJEV 100 UNIT/ML SOLN, USE A MAXIMUM OF 90 UNITS DAILY VIA INSULIN PUMP   metFORMIN (GLUCOPHAGE-XR) 500 MG 24 hr tablet, TAKE 1 TABLET IN THE MORNING AND 2 TABLETS IN THE  EVENING AS DIRECTED  Current Outpatient Medications (Cardiovascular):    ezetimibe (ZETIA) 10 MG tablet, TAKE 1 TABLET DAILY   losartan (COZAAR) 25 MG tablet, TAKE 1 TABLET DAILY   metoprolol succinate (TOPROL-XL) 100 MG 24 hr tablet, TAKE 1 TABLET DAILY   nitroGLYCERIN (NITROSTAT) 0.4 MG SL tablet, DISSOLVE 1 TABLET UNDER THE TONGUE EVERY 5 MINUTES AS NEEDED FOR CHEST PAIN   rosuvastatin (CRESTOR) 40 MG tablet, TAKE 1 TABLET DAILY   Current Outpatient Medications (Analgesics):    ASPIRIN 81 PO, Take by mouth.   ibuprofen (ADVIL) 200 MG tablet,  Take 200 mg by mouth every 6 (six) hours as needed.   traMADol (ULTRAM) 50 MG tablet, Take 1 tablet (50 mg total) by mouth every 8 (eight) hours as needed for up to 5 days.  Current Outpatient Medications (Hematological):    clopidogrel (PLAVIX) 75 MG tablet, TAKE 1 TABLET DAILY (KEEP UPCOMING APPOINTMENT IN MARCH 2023 WITH DR. Elease Hashimoto BEFORE ANYMORE REFILLS)  Current Outpatient Medications (Other):    Calcium Carbonate-Vitamin D (CALCIUM + D PO), Take 600 mg by mouth 2 (two) times daily.   cholecalciferol (VITAMIN D) 1000 units tablet, Take 1,000 Units by mouth 2 (two) times daily.    Continuous Blood Gluc Sensor (DEXCOM G6 SENSOR) MISC, by Does not apply route.   gabapentin (NEURONTIN) 100 MG capsule, Take 2 capsules (200 mg total) by mouth at bedtime.   Multiple Vitamins-Minerals (PRESERVISION AREDS 2 PO), Take by mouth in the morning and at bedtime.   solifenacin (VESICARE) 10 MG tablet, Take 1 tablet (10 mg total) by mouth daily.   TART CHERRY PO, Take by mouth.   Reviewed prior external information including notes and imaging from  primary care provider As well as notes that were available from care everywhere and other healthcare systems.  Past medical history, social, surgical and family history all reviewed in electronic medical record.  No pertanent information unless stated regarding to the chief complaint.   Review of Systems:  No  headache, visual changes, nausea, vomiting, diarrhea, constipation, dizziness, abdominal pain, skin rash, fevers, chills, night sweats, weight loss, swollen lymph nodes, body aches, joint swelling, chest pain, shortness of breath, mood changes. POSITIVE muscle aches  Objective  Blood pressure 118/72, pulse 74, height 5\' 9"  (1.753 m), weight 200 lb (90.7 kg), SpO2 98%.   General: No apparent distress alert and oriented x3 mood and affect normal, dressed appropriately.  HEENT: Pupils equal, extraocular movements intact  Severely antalgic gait noted.  Tender to palpation over the medial aspect of the plateau.  The patient does have lacking motion secondary to involuntary involuntary guarding noted.  Ambulating with the aid of a cane    Impression and Recommendations:    The above documentation has been reviewed and is accurate and complete Judi Saa, DO

## 2023-09-02 NOTE — Assessment & Plan Note (Addendum)
Significant meniscal tear noted.  Does have a tear of the posterior horn of the medial meniscus as well as the lateral aspect.  Patient does have unfortunately protrusion noted on both of these.  I do think that that is contributing to most of the discomfort but patient also has a nondisplaced fracture of the subchondral bone that seems to be an insufficiency fracture.  Laboratory workup ordered as well as bone density.  Will refer patient to orthopedic surgery to address the meniscus.  Do believe that she could be a candidate for arthroscopic surgery.  Do not think that she is in need of a knee replacement as of yet.  Follow-up with me again to discuss further after seen surgery but likely will have surgical intervention. Declined crutches

## 2023-09-03 ENCOUNTER — Encounter: Payer: Self-pay | Admitting: Family Medicine

## 2023-09-03 ENCOUNTER — Telehealth: Payer: Self-pay

## 2023-09-03 LAB — PTH, INTACT AND CALCIUM
Calcium: 8.9 mg/dL (ref 8.6–10.4)
PTH: 78 pg/mL — ABNORMAL HIGH (ref 16–77)

## 2023-09-03 NOTE — Telephone Encounter (Signed)
Patient had a question regarding her Dexcom G7 while having a bone density done, RN attempted to call back . No answer.

## 2023-09-04 ENCOUNTER — Other Ambulatory Visit: Payer: Medicare Other

## 2023-09-05 ENCOUNTER — Ambulatory Visit: Payer: Medicare Other | Admitting: Family Medicine

## 2023-09-05 ENCOUNTER — Encounter (HOSPITAL_COMMUNITY): Payer: Medicare Other

## 2023-09-06 ENCOUNTER — Other Ambulatory Visit: Payer: Medicare Other

## 2023-09-09 ENCOUNTER — Ambulatory Visit: Payer: Medicare Other | Admitting: Family Medicine

## 2023-09-09 DIAGNOSIS — M25561 Pain in right knee: Secondary | ICD-10-CM | POA: Diagnosis not present

## 2023-09-09 DIAGNOSIS — S83242A Other tear of medial meniscus, current injury, left knee, initial encounter: Secondary | ICD-10-CM | POA: Diagnosis not present

## 2023-09-09 DIAGNOSIS — S83281A Other tear of lateral meniscus, current injury, right knee, initial encounter: Secondary | ICD-10-CM | POA: Diagnosis not present

## 2023-09-15 ENCOUNTER — Other Ambulatory Visit: Payer: Self-pay | Admitting: Internal Medicine

## 2023-09-25 ENCOUNTER — Encounter: Payer: Self-pay | Admitting: Endocrinology

## 2023-09-25 ENCOUNTER — Ambulatory Visit: Payer: Medicare Other | Admitting: Endocrinology

## 2023-09-25 VITALS — BP 118/80 | HR 75 | Resp 20 | Ht 69.0 in | Wt 196.4 lb

## 2023-09-25 DIAGNOSIS — E109 Type 1 diabetes mellitus without complications: Secondary | ICD-10-CM

## 2023-09-25 LAB — POCT GLYCOSYLATED HEMOGLOBIN (HGB A1C): Hemoglobin A1C: 6.6 % — AB (ref 4.0–5.6)

## 2023-09-25 NOTE — Progress Notes (Signed)
Outpatient Endocrinology Note Audrey Leonette Tischer, MD  09/25/23  Patient's Name: [name]    DOB: [birth year]    MRN: [id number]                                                    REASON OF VISIT: Follow up for type 1 diabetes mellitus  PCP: Myrlene Broker, MD  HISTORY OF PRESENT ILLNESS:   [name] is a 77 y.o. old female with past medical history listed below, is here for follow up of type 1 diabetes mellitus.   Pertinent Diabetes History: Patient was diagnosed with type 1 diabetes mellitus in 1979.  She has been on t:slim tandem insulin pump.  She has also been on metformin.  She used to be on pioglitazone in the past which had decreased requirement of insulin dose.  She has been on t:slim tandem insulin pump since October 2021.  Chronic Diabetes Complications : Retinopathy: no. Last ophthalmology exam was done on annually. Nephropathy: no, on losartan. Peripheral neuropathy: no Coronary artery disease: no Stroke: no  Relevant comorbidities and cardiovascular risk factors: Obesity: yes Body mass index is 29 kg/m.  Hypertension: yes Hyperlipidemia. yes  Current / Home Diabetic regimen includes:  T:slim insulin pump with Dexcom G7 uses Lyumjev U100  Insulin Pump setting:  Basal MN- 0.5u/hour 3:30AM-1.5  5AM- 1.4 6AM- 2.2 10AM- 0.750 11AM- 0.750 1PM-   1.6 3:30PM-1.9 4:30PM-1.8 8PM-     2.0 10PM-   1.8  Bolus CHO Ratio (1unit:CHO) MN- 1:12 3:30AM-1:12 5AM- 1:12 6AM- 1:12 10AM- 1:12 11AM- 1:5 1PM-   1:5 3:30PM-1:5 4:30PM-1:5.5 8PM-     1:5.5 10PM-   1:5.5   Correction/Sensitivity: MN- 1:25 3:30AM-1:25  5AM- 1:25 6AM- 1:25 10AM- 1:30 11AM- 1:25 1PM-   1:20 3:30PM-1:20 4:30PM-1:20 8PM-     1:25 10PM-   1:25  Target: 125  Active insulin time: 5 hours  -Metformin extended release 500 mg in the morning and 1000 mg in the evening.  Prior diabetic medications: Pioglitazone.  CONTINUOUS GLUCOSE MONITORING SYSTEM (CGMS) /  INSULIN PUMP INTERPRETATION:                         Not able to download pump data in the clinic today.   CONTINUOUS GLUCOSE MONITORING SYSTEM (CGMS) INTERPRETATION:                         Dexcom G7 CGM-  Sensor Download (Sensor download was reviewed and summarized below.) Dates: November 8 to September 25, 2023  Glucose Management Indicator: 6.7% Sensor Average: 143 SD 54 Sensor usage:93 %  Glycemic Trends:  <54: <1% 54-70: 2% 71-180: 82% 181-250: 11% 251-400: 5%  Impression: -Mostly acceptable blood sugar.  She has occasional mild hyperglycemia with blood sugar in 200s related with meals especially lungs rarely with afternoon snack and 1 time at bedtime related with late meal bolus.  Overnight and in between the blood sugars are acceptable.  No concerning hypoglycemia.  On November 11 overnight and after breakfast had mild hypoglycemia, probably related with meal bolus.  Last weekend she had persistent hyperglycemia related with site issue.  Hypoglycemia: Patient has minor hypoglycemic episodes. Patient has hypoglycemia awareness.    Factors modifying glucose control: 1.  Diabetic diet assessment: 3 meals  a day.  Usually eats outside on Friday evening and weekend  2.  Staying active or exercising: Walking regularly.  3.  Medication compliance: compliant all of the time.  Interval history  CGM data as reviewed above.  Patient continues to have occasional connectivity issue with Dexcom G7.  Hemoglobin A1c today 6.6%.  GMI on pump is 6.7%.  Overall acceptable blood sugar.  Occasional snacks/meals related mild hyperglycemia.  No other complaints today.  REVIEW OF SYSTEMS As per history of present illness.   PAST MEDICAL HISTORY: Past Medical History:  Diagnosis Date   Arthritis    OA   Atrial flutter (HCC)    a. s/p TEE/DCCV 08/16/13 (normal LV function, no LAA thrombus).b. s/p ablation by Dr Ladona Ridgel 09-23-2013   Coronary artery disease    2009 LAD Promus stent    Dysrhythmia    Aflutter, no issues after ablation 2014   Fibroid    Hyperlipidemia    Insulin pump in place    Osteopenia    Type 1 diabetes mellitus on insulin therapy (HCC)    Valvular heart disease    a. Mild MR/TR by TEE 08/2013.    PAST SURGICAL HISTORY: Past Surgical History:  Procedure Laterality Date   ABLATION  09-23-2013   CTI by Dr Ladona Ridgel   APPENDECTOMY     APPLICATION OF WOUND VAC Left 04/11/2017   Procedure: APPLICATION OF WOUND VAC LEFT KNEE;  Surgeon: Samson Frederic, MD;  Location: [facility] OR;  Service: Orthopedics;  Laterality: Left;   ATRIAL FLUTTER ABLATION N/A 09/23/2013   Procedure: ATRIAL FLUTTER ABLATION;  Surgeon: Marinus Maw, MD;  Location: [facility] CATH LAB;  Service: Cardiovascular;  Laterality: N/A;   CARDIOVERSION N/A 08/16/2013   Procedure: CARDIOVERSION;  Surgeon: Vesta Mixer, MD;  Location: [facility] ENDOSCOPY;  Service: Cardiovascular;  Laterality: N/A;  spoke with Tom   CATARACT EXTRACTION Bilateral    COLONOSCOPY     CORONARY ANGIOPLASTY WITH STENT PLACEMENT     MYOMECTOMY  1977   ORIF PATELLA Left 04/11/2017   ORIF PATELLA Left 04/11/2017   Procedure: OPEN REDUCTION INTERNAL (ORIF) FIXATION PATELLA LEFT KNEE;  Surgeon: Samson Frederic, MD;  Location: [facility] OR;  Service: Orthopedics;  Laterality: Left;   PELVIC LAPAROSCOPY     TEE WITHOUT CARDIOVERSION N/A 08/16/2013   Procedure: TRANSESOPHAGEAL ECHOCARDIOGRAM (TEE);  Surgeon: Vesta Mixer, MD;  Location: [facility] ENDOSCOPY;  Service: Cardiovascular;  Laterality: N/A;    ALLERGIES: Allergies  Allergen Reactions   Sulfa Antibiotics Rash   Sulfamethoxazole-Trimethoprim Rash    FAMILY HISTORY:  Family History  Problem Relation Age of Onset   CAD Father 73       died age 60   CAD Brother 30       small vessel disease   Atrial fibrillation Mother    Diabetes Mother    Hypertension Mother    CAD Cousin        paternal   CAD Paternal Grandfather        early onset   Colon cancer Neg Hx     SOCIAL  HISTORY: Social History   Socioeconomic History   Marital status: Married    Spouse name: Jesusita Oka   Number of children: 2   Years of education: 14   Highest education level: Bachelor's degree (e.g., BA, AB, BS)  Occupational History   Occupation: home maker    Comment: Children adopted  Tobacco Use   Smoking status: Never   Smokeless tobacco: Never  Vaping Use  Vaping status: Never Used  Substance and Sexual Activity   Alcohol use: Yes    Alcohol/week: 5.0 standard drinks of alcohol    Types: 5 Glasses of wine per week    Comment: on weekends   Drug use: No   Sexual activity: Yes    Birth control/protection: Post-menopausal  Other Topics Concern   Not on file  Social History Narrative   Not on file   Social Determinants of Health   Financial Resource Strain: Low Risk  (07/03/2023)   Overall Financial Resource Strain (CARDIA)    Difficulty of Paying Living Expenses: Not hard at all  Food Insecurity: No Food Insecurity (07/03/2023)   Hunger Vital Sign    Worried About Running Out of Food in the Last Year: Never true    Ran Out of Food in the Last Year: Never true  Transportation Needs: No Transportation Needs (07/03/2023)   PRAPARE - Administrator, Civil Service (Medical): No    Lack of Transportation (Non-Medical): No  Physical Activity: Insufficiently Active (07/03/2023)   Exercise Vital Sign    Days of Exercise per Week: 4 days    Minutes of Exercise per Session: 20 min  Stress: No Stress Concern Present (07/03/2023)   [name] of Occupational Health - Occupational Stress Questionnaire    Feeling of Stress : Only a little  Social Connections: Socially Integrated (07/03/2023)   Social Connection and Isolation Panel [NHANES]    Frequency of Communication with Friends and Family: More than three times a week    Frequency of Social Gatherings with Friends and Family: More than three times a week    Attends Religious Services: More than 4 times per year     Active Member of [employer] or Organizations: Yes    Attends Engineer, structural: More than 4 times per year    Marital Status: Married    MEDICATIONS:  Current Outpatient Medications  Medication Sig Dispense Refill   ASPIRIN 81 PO Take by mouth.     Calcium Carbonate-Vitamin D (CALCIUM + D PO) Take 600 mg by mouth 2 (two) times daily.     cholecalciferol (VITAMIN D) 1000 units tablet Take 1,000 Units by mouth 2 (two) times daily.      clopidogrel (PLAVIX) 75 MG tablet TAKE 1 TABLET DAILY (KEEP UPCOMING APPOINTMENT IN MARCH 2023 WITH DR. Elease Hashimoto BEFORE ANYMORE REFILLS) 90 tablet 2   Continuous Blood Gluc Sensor (DEXCOM G6 SENSOR) MISC by Does not apply route.     cyanocobalamin (VITAMIN B12) 1000 MCG tablet Take 1,000 mcg by mouth daily.     ezetimibe (ZETIA) 10 MG tablet TAKE 1 TABLET DAILY 90 tablet 1   gabapentin (NEURONTIN) 100 MG capsule Take 2 capsules (200 mg total) by mouth at bedtime. 180 capsule 1   ibuprofen (ADVIL) 200 MG tablet Take 200 mg by mouth every 6 (six) hours as needed.     losartan (COZAAR) 25 MG tablet TAKE 1 TABLET DAILY 90 tablet 3   LYUMJEV 100 UNIT/ML SOLN USE A MAXIMUM OF 90 UNITS DAILY VIA INSULIN PUMP 80 mL 3   metFORMIN (GLUCOPHAGE-XR) 500 MG 24 hr tablet TAKE 1 TABLET IN THE MORNING AND 2 TABLETS IN THE EVENING AS DIRECTED 270 tablet 3   metoprolol succinate (TOPROL-XL) 100 MG 24 hr tablet TAKE 1 TABLET DAILY 90 tablet 3   Multiple Vitamins-Minerals (PRESERVISION AREDS 2 PO) Take by mouth in the morning and at bedtime.     nitroGLYCERIN (NITROSTAT) 0.4  MG SL tablet DISSOLVE 1 TABLET UNDER THE TONGUE EVERY 5 MINUTES AS NEEDED FOR CHEST PAIN 75 tablet 2   rosuvastatin (CRESTOR) 40 MG tablet TAKE 1 TABLET DAILY 90 tablet 3   solifenacin (VESICARE) 10 MG tablet TAKE 1 TABLET DAILY 90 tablet 3   TART CHERRY PO Take by mouth.     No current facility-administered medications for this visit.    PHYSICAL EXAM: Vitals:   09/25/23 0846  BP: 118/80   Pulse: 75  Resp: 20  SpO2: 98%  Weight: 196 lb 6.4 oz (89.1 kg)  Height: 5\' 9"  (1.753 m)    Body mass index is 29 kg/m.  Wt Readings from Last 3 Encounters:  09/25/23 196 lb 6.4 oz (89.1 kg)  09/02/23 200 lb (90.7 kg)  08/06/23 206 lb (93.4 kg)    General: Well developed, well nourished female in no apparent distress.  HEENT: AT/[place], no external lesions.  Eyes: Conjunctiva clear and no icterus. Neck: Neck supple  Lungs: Respirations not labored Neurologic: Alert, oriented, normal speech Extremities / Skin: Dry. No sores or rashes noted.  Psychiatric: Does not appear depressed or anxious  Diabetic Foot Exam - Simple   No data filed    LABS Reviewed Lab Results  Component Value Date   HGBA1C 6.6 (A) 09/25/2023   HGBA1C 7.0 (H) 06/27/2023   HGBA1C 7.1 (H) 04/02/2023   Lab Results  Component Value Date   FRUCTOSAMINE 270 10/06/2019   FRUCTOSAMINE 292 (H) 02/04/2017   FRUCTOSAMINE 278 05/11/2013   Lab Results  Component Value Date   CHOL 107 04/02/2023   HDL 40.00 04/02/2023   LDLCALC 39 04/02/2023   LDLDIRECT 79.0 01/11/2015   TRIG 136.0 04/02/2023   CHOLHDL 3 04/02/2023   Lab Results  Component Value Date   MICRALBCREAT 2.2 07/01/2023   MICRALBCREAT 1.2 07/11/2022   Lab Results  Component Value Date   CREATININE 0.85 04/02/2023   Lab Results  Component Value Date   GFR 67.08 04/02/2023    ASSESSMENT / PLAN  1. Type 1 diabetes mellitus without complications (HCC)      Diabetes Mellitus type 1, complicated by no known complications.  - Diabetic status / severity: controlled.   Lab Results  Component Value Date   HGBA1C 6.6 (A) 09/25/2023    - Hemoglobin A1c goal <7%   - Medications:  Insulin pump setting changed as follows:  Tandem t:slim insulin pump on control IQ using Dexcom G7.uses Lyumjev U100  No change in the pump settings today.  Advised patient to meal bolus before eating and also bolus for all snacks.   -Metformin extended  release 500 mg in the morning and 1000 mg in the evening.  - Home glucose testing: continue CGM and check blood glucose as needed.  - Discussed/ Gave Hypoglycemia treatment plan.  # Consult : not required at this time.   # Annual urine for microalbuminuria/ creatinine ratio, no microalbuminuria currently, continue ACE/ARB losartan. Last  Lab Results  Component Value Date   MICRALBCREAT 2.2 07/01/2023    # Foot check nightly.  # Annual dilated diabetic eye exams.   - Diet: Eat reasonable portion sizes to promote a healthy weight - Life style / activity / exercise: Discussed  2. Blood pressure  -  BP Readings from Last 1 Encounters:  09/25/23 118/80    - Control is in target.  - No change in current plans.  3. Lipid status / Hyperlipidemia - Last  Lab Results  Component Value  Date   LDLCALC 39 04/02/2023   - Continue rosuvastatin 40 mg daily and Zetia 10 mg daily.  Diagnoses and all orders for this visit:  Type 1 diabetes mellitus without complications (HCC) -     POCT glycosylated hemoglobin (Hb A1C) -     Basic metabolic panel -     Hemoglobin A1c     DISPOSITION Follow up in clinic in 3 months suggested.   All questions answered and patient verbalized understanding of the plan.  Audrey Santo Zahradnik, MD [facility] Endocrinology [facility] [address] [place], [address] [place], [place] [address] Phone # [phone]  At least part of this note was generated using voice recognition software. Inadvertent word errors may have occurred, which were not recognized during the proofreading process.

## 2023-10-15 ENCOUNTER — Ambulatory Visit (INDEPENDENT_AMBULATORY_CARE_PROVIDER_SITE_OTHER): Payer: Medicare Other | Admitting: Vascular Surgery

## 2023-10-15 ENCOUNTER — Ambulatory Visit (HOSPITAL_COMMUNITY)
Admission: RE | Admit: 2023-10-15 | Discharge: 2023-10-15 | Disposition: A | Discharge status: Home / Self Care | Payer: Medicare Other | Source: Ambulatory Visit | Attending: Vascular Surgery | Admitting: Vascular Surgery

## 2023-10-15 ENCOUNTER — Encounter: Payer: Self-pay | Admitting: Vascular Surgery

## 2023-10-15 VITALS — BP 143/72 | HR 68 | Temp 98.3°F | Resp 20 | Ht 69.0 in | Wt 200.0 lb

## 2023-10-15 DIAGNOSIS — I83813 Varicose veins of bilateral lower extremities with pain: Secondary | ICD-10-CM | POA: Insufficient documentation

## 2023-10-15 DIAGNOSIS — I872 Venous insufficiency (chronic) (peripheral): Secondary | ICD-10-CM | POA: Diagnosis not present

## 2023-10-15 NOTE — Progress Notes (Signed)
Patient ID: [name], female   DOB: [birth year], 77 y.o.   MRN: [id number]  Reason for Consult: Follow-up   Referred by [name], *  Subjective:     HPI:  Audrey BETRO is a 77 y.o. 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  Since our last evaluation she has been wearing thigh-high compression stockings and has had some improvement in the swelling.  She does have pain in the varicosities that persist and she is hopeful for intervention.  Varicosities hurt more at the end of the day and swelling is certainly worse by the end of the day as well when she has been on her feet for long periods of time.  She is here today for follow-up to discuss possible treatment options.  She has not had any bleeding or clotting issues with her varicosities and has no history of DVT.  Past Medical History:  Diagnosis Date   Arthritis    OA   Atrial flutter (HCC)    a. s/p TEE/DCCV 08/16/13 (normal LV function, no LAA thrombus).b. s/p ablation by Dr Ladona Ridgel 09-23-2013   Coronary artery disease    2009 LAD Promus stent   Dysrhythmia    Aflutter, no issues after ablation 2014   Fibroid    Hyperlipidemia    Insulin pump in place    Osteopenia    Type 1 diabetes mellitus on insulin therapy (HCC)    Valvular heart disease    a. Mild MR/TR by TEE 08/2013.   Family History  Problem Relation Age of Onset   CAD Father 65       died age 21   CAD Brother 66       small vessel disease   Atrial fibrillation Mother    Diabetes Mother    Hypertension Mother    CAD Cousin        paternal   CAD Paternal Grandfather        early onset   Colon cancer Neg Hx    Past Surgical History:  Procedure Laterality Date   ABLATION  09-23-2013   CTI by Dr Ladona Ridgel   APPENDECTOMY     APPLICATION OF WOUND VAC Left 04/11/2017   Procedure: APPLICATION OF WOUND VAC LEFT KNEE;  Surgeon: Samson Frederic, MD;  Location: [facility] OR;   Service: Orthopedics;  Laterality: Left;   ATRIAL FLUTTER ABLATION N/A 09/23/2013   Procedure: ATRIAL FLUTTER ABLATION;  Surgeon: Marinus Maw, MD;  Location: [facility] CATH LAB;  Service: Cardiovascular;  Laterality: N/A;   CARDIOVERSION N/A 08/16/2013   Procedure: CARDIOVERSION;  Surgeon: Vesta Mixer, MD;  Location: [facility] ENDOSCOPY;  Service: Cardiovascular;  Laterality: N/A;  spoke with Tom   CATARACT EXTRACTION Bilateral    COLONOSCOPY     CORONARY ANGIOPLASTY WITH STENT PLACEMENT     MYOMECTOMY  1977   ORIF PATELLA Left 04/11/2017   ORIF PATELLA Left 04/11/2017   Procedure: OPEN REDUCTION INTERNAL (ORIF) FIXATION PATELLA LEFT KNEE;  Surgeon: Samson Frederic, MD;  Location: [facility] OR;  Service: Orthopedics;  Laterality: Left;   PELVIC LAPAROSCOPY     TEE WITHOUT CARDIOVERSION N/A 08/16/2013   Procedure: TRANSESOPHAGEAL ECHOCARDIOGRAM (TEE);  Surgeon: Vesta Mixer, MD;  Location: [facility] ENDOSCOPY;  Service: Cardiovascular;  Laterality: N/A;    Short Social History:  Social History   Tobacco Use   Smoking status: Never   Smokeless tobacco: Never  Substance Use Topics  Alcohol use: Yes    Alcohol/week: 5.0 standard drinks of alcohol    Types: 5 Glasses of wine per week    Comment: on weekends    Allergies  Allergen Reactions   Sulfa Antibiotics Rash   Sulfamethoxazole-Trimethoprim Rash    Current Outpatient Medications  Medication Sig Dispense Refill   ASPIRIN 81 PO Take by mouth.     Calcium Carbonate-Vitamin D (CALCIUM + D PO) Take 600 mg by mouth 2 (two) times daily.     cholecalciferol (VITAMIN D) 1000 units tablet Take 1,000 Units by mouth 2 (two) times daily.      clopidogrel (PLAVIX) 75 MG tablet TAKE 1 TABLET DAILY (KEEP UPCOMING APPOINTMENT IN MARCH 2023 WITH DR. Elease Hashimoto BEFORE ANYMORE REFILLS) 90 tablet 2   Continuous Blood Gluc Sensor (DEXCOM G6 SENSOR) MISC by Does not apply route.     cyanocobalamin (VITAMIN B12) 1000 MCG tablet Take 1,000 mcg by mouth daily.      ezetimibe (ZETIA) 10 MG tablet TAKE 1 TABLET DAILY 90 tablet 1   gabapentin (NEURONTIN) 100 MG capsule Take 2 capsules (200 mg total) by mouth at bedtime. 180 capsule 1   ibuprofen (ADVIL) 200 MG tablet Take 200 mg by mouth every 6 (six) hours as needed.     losartan (COZAAR) 25 MG tablet TAKE 1 TABLET DAILY 90 tablet 3   LYUMJEV 100 UNIT/ML SOLN USE A MAXIMUM OF 90 UNITS DAILY VIA INSULIN PUMP 80 mL 3   metFORMIN (GLUCOPHAGE-XR) 500 MG 24 hr tablet TAKE 1 TABLET IN THE MORNING AND 2 TABLETS IN THE EVENING AS DIRECTED 270 tablet 3   metoprolol succinate (TOPROL-XL) 100 MG 24 hr tablet TAKE 1 TABLET DAILY 90 tablet 3   Multiple Vitamins-Minerals (PRESERVISION AREDS 2 PO) Take by mouth in the morning and at bedtime.     nitroGLYCERIN (NITROSTAT) 0.4 MG SL tablet DISSOLVE 1 TABLET UNDER THE TONGUE EVERY 5 MINUTES AS NEEDED FOR CHEST PAIN 75 tablet 2   rosuvastatin (CRESTOR) 40 MG tablet TAKE 1 TABLET DAILY 90 tablet 3   solifenacin (VESICARE) 10 MG tablet TAKE 1 TABLET DAILY 90 tablet 3   TART CHERRY PO Take by mouth.     No current facility-administered medications for this visit.    Review of Systems  Constitutional:  Constitutional negative. HENT: HENT negative.  Eyes: Eyes negative.  Respiratory: Respiratory negative.  Cardiovascular: Positive for leg swelling.  GI: Gastrointestinal negative.  Musculoskeletal: Positive for leg pain.  Neurological: Neurological negative. Hematologic: Hematologic/lymphatic negative.  Psychiatric: Psychiatric negative.        Objective:  Objective   Vitals:   10/15/23 1351  BP: (!) 143/72  Pulse: 68  Resp: 20  Temp: 98.3 F (36.8 C)  SpO2: 97%  Weight: 200 lb (90.7 kg)  Height: 5\' 9"  (1.753 m)   Body mass index is 29.53 kg/m.  Physical Exam HENT:     Mouth/Throat:     Mouth: Mucous membranes are moist.  Eyes:     Pupils: Pupils are equal, round, and reactive to light.  Cardiovascular:     Rate and Rhythm: Normal rate.     Pulses:  Normal pulses.  Pulmonary:     Effort: Pulmonary effort is normal.  Abdominal:     General: Abdomen is flat.  Musculoskeletal:     Right lower leg: Edema present.     Left lower leg: No edema.     Comments: Well-healed left knee incision  Skin:    Capillary Refill:  Capillary refill takes less than 2 seconds.  Neurological:     General: No focal deficit present.     Mental Status: She is alert.  Psychiatric:        Mood and Affect: Mood normal.        Thought Content: Thought content normal.        Judgment: Judgment normal.          Data: Venous Reflux Times  +--------------+---------+------+-----------+------------+--------+  RIGHT        Reflux NoRefluxReflux TimeDiameter cmsComments                          Yes                                   +--------------+---------+------+-----------+------------+--------+  CFV                    yes   >1 second                       +--------------+---------+------+-----------+------------+--------+  FV prox       no                                              +--------------+---------+------+-----------+------------+--------+  FV mid        no                                              +--------------+---------+------+-----------+------------+--------+  FV dist       no                                              +--------------+---------+------+-----------+------------+--------+  Popliteal    no                                              +--------------+---------+------+-----------+------------+--------+  GSV at SFJ              yes    >500 ms     0.863              +--------------+---------+------+-----------+------------+--------+  GSV prox thigh          yes    >500 ms     0.493              +--------------+---------+------+-----------+------------+--------+  GSV mid thigh           yes    >500 ms     0.493               +--------------+---------+------+-----------+------------+--------+  GSV dist thighno                           0.319              +--------------+---------+------+-----------+------------+--------+  GSV at knee   no  0.256              +--------------+---------+------+-----------+------------+--------+  GSV prox calf no                           0.292              +--------------+---------+------+-----------+------------+--------+  SSV Pop Fossa no                           0.302              +--------------+---------+------+-----------+------------+--------+  SSV prox calf no                           0.101              +--------------+---------+------+-----------+------------+--------+  SSV mid calf  no                           0.247              +--------------+---------+------+-----------+------------+--------+         Summary:  Right:  - No evidence of deep vein thrombosis seen in the right lower extremity,  from the common femoral through the popliteal veins.  - No evidence of superficial venous thrombosis in the right lower  extremity.    - Deep vein reflux in the CFV.   - Superficial vein reflux in the SFJ, and GSV prox and mid thigh.      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  We have discussed her options which will require continued use of compression stockings.  Ultrasound was used to evaluate her great saphenous vein at bedside today demonstrates a very large vein within the fascia from about the distal third of the thigh up to the saphenofemoral junction and gives rise to multiple varicosities distally in the thigh.  Plan will be for right greater saphenous vein ablation as he will need stab phlebectomy greater than 20 as well as sclerotherapy of at least 2 bilateral after treatment.  We discussed the  risk particularly of DVT, benefits and alternatives and she demonstrates good understanding.  Will get her scheduled in the near future.      Maeola Harman MD [facility]

## 2023-10-22 ENCOUNTER — Other Ambulatory Visit: Payer: Self-pay | Admitting: *Deleted

## 2023-10-22 DIAGNOSIS — I83811 Varicose veins of right lower extremities with pain: Secondary | ICD-10-CM

## 2023-11-03 ENCOUNTER — Ambulatory Visit (INDEPENDENT_AMBULATORY_CARE_PROVIDER_SITE_OTHER): Payer: Medicare Other | Admitting: Internal Medicine

## 2023-11-03 VITALS — BP 140/80 | HR 77 | Temp 97.9°F | Ht 69.0 in | Wt 200.0 lb

## 2023-11-03 DIAGNOSIS — L989 Disorder of the skin and subcutaneous tissue, unspecified: Secondary | ICD-10-CM

## 2023-11-04 ENCOUNTER — Encounter: Payer: Self-pay | Admitting: Internal Medicine

## 2023-11-04 DIAGNOSIS — L989 Disorder of the skin and subcutaneous tissue, unspecified: Secondary | ICD-10-CM | POA: Insufficient documentation

## 2023-11-04 NOTE — Assessment & Plan Note (Signed)
Non cancerous and will monitor for change/growth. Reassurance given.

## 2023-11-11 ENCOUNTER — Ambulatory Visit: Payer: Medicare Other | Admitting: Internal Medicine

## 2023-11-12 ENCOUNTER — Other Ambulatory Visit: Payer: Self-pay | Admitting: *Deleted

## 2023-11-12 MED ORDER — LORAZEPAM 1 MG PO TABS: ORAL_TABLET | ORAL | 0 refills | Status: DC

## 2023-11-20 ENCOUNTER — Ambulatory Visit (INDEPENDENT_AMBULATORY_CARE_PROVIDER_SITE_OTHER): Payer: Medicare Other | Admitting: Vascular Surgery

## 2023-11-20 ENCOUNTER — Encounter: Payer: Self-pay | Admitting: Vascular Surgery

## 2023-11-20 VITALS — BP 99/58 | HR 85 | Temp 98.6°F | Resp 18 | Ht 68.5 in | Wt 195.0 lb

## 2023-11-20 DIAGNOSIS — I872 Venous insufficiency (chronic) (peripheral): Secondary | ICD-10-CM

## 2023-11-20 DIAGNOSIS — I83811 Varicose veins of right lower extremities with pain: Secondary | ICD-10-CM

## 2023-11-20 HISTORY — PX: LASER ABLATION: SHX1947

## 2023-11-20 NOTE — Progress Notes (Signed)
     Laser Ablation Procedure    Date: 11/20/2023  [name] DOB:15-Jul-1946 Consent signed: Yes     Surgeon: Lemar Livings Procedure: Laser Ablation: right Greater Saphenous Vein  BP (!) 99/58 (BP Location: Left Arm, Patient Position: Sitting, Cuff Size: Normal)   Pulse 85   Temp 98.6 F (37 C) (Oral)   Resp 18   Ht 5' 8.5" (1.74 m)   Wt 195 lb (88.5 kg)   SpO2 95%   BMI 29.22 kg/m   Tumescent Anesthesia: 600 cc 0.9% NaCl with 50 cc Lidocaine HCL 1%  and 15 cc 8.4% NaHCO3  Local Anesthesia: 35 cc Lidocaine HCL and NaHCO3 (ratio 2:1) 7 watts continuous mode    Total energy: 855.6 joules    Total time: 122 seconds Treatment Length 17 cm Laser Fiber Ref. #  [id number]        Lot # [id number] Stab Phlebectomy: >20 Sites: Thigh and Calf Patient tolerated procedure well  Notes:  All staff members wore facial masks. Pt 1 tab 1 mg ativan at 11:45 am prior to surgical procedure     Description of Procedure: After marking the course of the secondary varicosities, the patient was placed on the operating table in the supine position, and the right leg was prepped and draped in sterile fashion.   Local anesthetic was administered and under ultrasound guidance the saphenous vein was accessed with a micro needle and guide wire; then the mirco puncture sheath was placed.  A guide wire was inserted saphenofemoral junction , followed by a 5 french sheath.  The position of the sheath and then the laser fiber below the junction was confirmed using the ultrasound.  Tumescent anesthesia was administered along the course of the saphenous vein using ultrasound guidance. The patient was placed in Trendelenburg position and protective laser glasses were placed on patient and staff, and the laser was fired at 7 watts continuous mode for a total of 855.6  joules.  For stab phlebectomies, local anesthetic was administered at the previously marked varicosities, and tumescent anesthesia was administered around  the vessels.  Greater than 20 stab wounds were made using the tip of an 11 blade. And using the vein hook, the phlebectomies were performed using a hemostat to avulse the varicosities.  Adequate hemostasis was achieved.   Steri strips were applied to the stab wounds and ABD pads and thigh high compression stockings were applied.  Ace wrap bandages were applied over the phlebectomy sites and at the top of the saphenofemoral junction. Blood loss was less than 15 cc.  Discharge instructions reviewed with patient and hardcopy of discharge instructions given to patient to take home. The patient ambulated out of the operating room having tolerated the procedure well.

## 2023-11-20 NOTE — Progress Notes (Signed)
Patient name: [name] MRN: [id number] DOB: 1946/06/27 Sex: female  REASON FOR VISIT: Treatment of symptomatic greater saphenous vein reflux and varicosities of the right lower extremity  HPI: [name] is a 78 y.o. 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  She has been compliant with thigh-high compression stockings and is now indicated for great saphenous vein ablation with stab phlebectomy greater than 20.  Current Outpatient Medications  Medication Sig Dispense Refill   ASPIRIN 81 PO Take by mouth.     Calcium Carbonate-Vitamin D (CALCIUM + D PO) Take 600 mg by mouth 2 (two) times daily.     cholecalciferol (VITAMIN D) 1000 units tablet Take 1,000 Units by mouth 2 (two) times daily.      clopidogrel (PLAVIX) 75 MG tablet TAKE 1 TABLET DAILY (KEEP UPCOMING APPOINTMENT IN MARCH 2023 WITH DR. Elease Hashimoto BEFORE ANYMORE REFILLS) 90 tablet 2   Continuous Blood Gluc Sensor (DEXCOM G6 SENSOR) MISC by Does not apply route.     cyanocobalamin (VITAMIN B12) 1000 MCG tablet Take 1,000 mcg by mouth daily.     ezetimibe (ZETIA) 10 MG tablet TAKE 1 TABLET DAILY 90 tablet 1   gabapentin (NEURONTIN) 100 MG capsule Take 2 capsules (200 mg total) by mouth at bedtime. 180 capsule 1   ibuprofen (ADVIL) 200 MG tablet Take 200 mg by mouth every 6 (six) hours as needed.     LORazepam (ATIVAN) 1 MG tablet Take 1 tablet 30 to 60 minutes prior to leaving house on day of office surgery.  Bring second tablet with you to office on day of office surgery. 2 tablet 0   losartan (COZAAR) 25 MG tablet TAKE 1 TABLET DAILY 90 tablet 3   LYUMJEV 100 UNIT/ML SOLN USE A MAXIMUM OF 90 UNITS DAILY VIA INSULIN PUMP 80 mL 3   metFORMIN (GLUCOPHAGE-XR) 500 MG 24 hr tablet TAKE 1 TABLET IN THE MORNING AND 2 TABLETS IN THE EVENING AS DIRECTED 270 tablet 3   metoprolol succinate (TOPROL-XL) 100 MG 24  hr tablet TAKE 1 TABLET DAILY 90 tablet 3   Multiple Vitamins-Minerals (PRESERVISION AREDS 2 PO) Take by mouth in the morning and at bedtime.     nitroGLYCERIN (NITROSTAT) 0.4 MG SL tablet DISSOLVE 1 TABLET UNDER THE TONGUE EVERY 5 MINUTES AS NEEDED FOR CHEST PAIN 75 tablet 2   rosuvastatin (CRESTOR) 40 MG tablet TAKE 1 TABLET DAILY 90 tablet 3   solifenacin (VESICARE) 10 MG tablet TAKE 1 TABLET DAILY 90 tablet 3   TART CHERRY PO Take by mouth.     No current facility-administered medications for this visit.    PHYSICAL EXAM: Vitals:   11/20/23 1337  BP: (!) 99/58  Pulse: 85  Resp: 18  Temp: 98.6 F (37 C)  SpO2: 95%   Awake alert and oriented x 3 Nonlabored respirations Right lower extremity varicosities marked with the patient standing   PROCEDURE: Right greater saphenous vein ablation totaling 17 cm and stab phlebectomy greater than 20 incisions  TECHNIQUE: Mrs. Ribordy was taken to the procedure room where she was initially placed standing and her varicosities were marked on the skin.  She was then laid supine and sterilely prepped and draped in the right lower extremity, timeout was called.  Ultrasound was used to identify the saphenous vein.  There appeared to be a dual saphenous vein system without any anterior  accessory saphenous vein.  I cannulated the greater saphenous vein that led to the very large varicosity crossing her knee and I cannulated below this area with a micropuncture needle followed by wire and sheath.  I then placed a J-wire under ultrasound guidance up to the saphenofemoral junction and then placed a sheath to 17 cm.  We again confirmed with ultrasound the location which was approximately 3 cm from the saphenofemoral junction.  Tumescent anesthesia was then instilled along the catheter tract.  The vein was then ablated for a total of 17 cm.  At completion the common femoral vein was noted to be patent and compressible.  Attention was then turned to the multiple  varicosities of the right medial thigh coursing along the anterior knee and down the medial and lateral leg.  These areas were anesthetized with 1% lidocaine stab incisions were created, the veins were grabbed with crochet hook and removed with hemostat.  At completion Steri-Strips were placed and a compressive dressing was placed.  She tolerated the procedure without any complication.  Plan will be for follow-up in 2 weeks with post ablation venous duplex.  Lemar Livings [facility] [phone]

## 2023-12-03 ENCOUNTER — Encounter: Payer: Self-pay | Admitting: Vascular Surgery

## 2023-12-03 ENCOUNTER — Ambulatory Visit (INDEPENDENT_AMBULATORY_CARE_PROVIDER_SITE_OTHER): Payer: Medicare Other | Admitting: Vascular Surgery

## 2023-12-03 ENCOUNTER — Ambulatory Visit (HOSPITAL_COMMUNITY)
Admission: RE | Admit: 2023-12-03 | Discharge: 2023-12-03 | Disposition: A | Discharge status: Home / Self Care | Payer: Medicare Other | Source: Ambulatory Visit | Attending: Vascular Surgery | Admitting: Vascular Surgery

## 2023-12-03 VITALS — BP 153/71 | HR 68 | Temp 98.1°F | Resp 20 | Ht 68.5 in | Wt 196.0 lb

## 2023-12-03 DIAGNOSIS — I83811 Varicose veins of right lower extremities with pain: Secondary | ICD-10-CM | POA: Diagnosis not present

## 2023-12-03 NOTE — Progress Notes (Signed)
     Subjective:     Patient ID: [name], female   DOB: [birth year], 78 y.o.   MRN: [id number]  HPI 78 year old female follows up after right greater saphenous vein ablation and stab phlebectomy greater than 20.  She is healing well without complaints today.   Review of Systems No complaints    Objective:   Physical Exam Vitals:   12/03/23 0937  BP: (!) 153/71  Pulse: 68  Resp: 20  Temp: 98.1 F (36.7 C)  SpO2: 94%      Venous Reflux Times  +---------+---------+------+-----------+------------+--------+  RIGHT   Reflux NoRefluxReflux TimeDiameter cmsComments                     Yes                                   +---------+---------+------+-----------+------------+--------+  CFV                                           Patent    +---------+---------+------+-----------+------------+--------+  FV mid                                         Patent    +---------+---------+------+-----------+------------+--------+  Popliteal                                     Patent    +---------+---------+------+-----------+------------+--------+         Summary:  Right:  - No evidence of deep vein thrombosis from the common femoral through the  popliteal veins.  - Successful ablation of the great saphenous vein from the distal thigh up  to approximately 1.09 cm from the SFJ.      Assessment/plan     78 year old female status post right greater saphenous vein ablation and stab phlebectomy far greater than 20 with satisfactory post ablation duplex.  Plan will be to follow-up with Cherene Julian, RN to discuss sclerotherapy.  She does not need intervention on the left lower extremity other than possibly additional sclerotherapy.  She can see me on an as-needed basis.    Doreather Hoxworth C. Randie Heinz, MD [facility] Office: [phone] Pager: [phone]

## 2023-12-15 DIAGNOSIS — E119 Type 2 diabetes mellitus without complications: Secondary | ICD-10-CM | POA: Diagnosis not present

## 2023-12-17 ENCOUNTER — Other Ambulatory Visit: Payer: Self-pay

## 2023-12-18 ENCOUNTER — Encounter: Payer: Self-pay | Admitting: Internal Medicine

## 2023-12-22 ENCOUNTER — Other Ambulatory Visit: Payer: Medicare Other

## 2023-12-22 DIAGNOSIS — E109 Type 1 diabetes mellitus without complications: Secondary | ICD-10-CM | POA: Diagnosis not present

## 2023-12-23 LAB — BASIC METABOLIC PANEL
BUN: 11 mg/dL (ref 7–25)
CO2: 32 mmol/L (ref 20–32)
Calcium: 9.3 mg/dL (ref 8.6–10.4)
Chloride: 104 mmol/L (ref 98–110)
Creat: 0.63 mg/dL (ref 0.60–1.00)
Glucose, Bld: 120 mg/dL — ABNORMAL HIGH (ref 65–99)
Potassium: 4.8 mmol/L (ref 3.5–5.3)
Sodium: 140 mmol/L (ref 135–146)

## 2023-12-23 LAB — HEMOGLOBIN A1C
Hgb A1c MFr Bld: 6.7 %{Hb} — ABNORMAL HIGH (ref <5.7)
Mean Plasma Glucose: 146 mg/dL
eAG (mmol/L): 8.1 mmol/L

## 2023-12-26 ENCOUNTER — Encounter: Payer: Self-pay | Admitting: Endocrinology

## 2023-12-26 ENCOUNTER — Telehealth (INDEPENDENT_AMBULATORY_CARE_PROVIDER_SITE_OTHER): Payer: Medicare Other | Admitting: Endocrinology

## 2023-12-26 DIAGNOSIS — E109 Type 1 diabetes mellitus without complications: Secondary | ICD-10-CM

## 2023-12-26 DIAGNOSIS — E782 Mixed hyperlipidemia: Secondary | ICD-10-CM | POA: Diagnosis not present

## 2023-12-26 MED ORDER — EZETIMIBE 10 MG PO TABS: 10.0000 mg | ORAL_TABLET | Freq: Every day | ORAL | 3 refills | Status: AC

## 2023-12-26 MED ORDER — METFORMIN HCL ER 500 MG PO TB24: ORAL_TABLET | ORAL | 3 refills | Status: AC

## 2023-12-26 MED ORDER — ROSUVASTATIN CALCIUM 40 MG PO TABS: 40.0000 mg | ORAL_TABLET | Freq: Every day | ORAL | 3 refills | Status: DC

## 2023-12-26 NOTE — Progress Notes (Signed)
Outpatient Endocrinology Note [place] Glanda Spanbauer, MD  12/26/23  Patient's Name: [name]    DOB: [birth year]    MRN: [id number]  I connected with  Suzi Roots on 12/26/23 by a video enabled telemedicine application and verified that I am speaking with the correct person using two identifiers.   I discussed the limitations of evaluation and management by telemedicine. The patient expressed understanding and agreed to proceed.   Provider location : office Patient location : home Reason for virtual visit : weather / winter storm                                                    REASON OF VISIT: Follow up for type 1 diabetes mellitus  PCP: Myrlene Broker, MD  HISTORY OF PRESENT ILLNESS:   [name] is a 78 y.o. old female with past medical history listed below, is here for follow up of type 1 diabetes mellitus.   Pertinent Diabetes History: Patient was diagnosed with type 1 diabetes mellitus in 1979.  She has been on t:slim tandem insulin pump.  She has also been on metformin.  She used to be on pioglitazone in the past which had decreased requirement of insulin dose.  She has been on t:slim tandem insulin pump since October 2021.  Chronic Diabetes Complications : Retinopathy: no. Last ophthalmology exam was done on annually. Nephropathy: no, on losartan. Peripheral neuropathy: no Coronary artery disease: no Stroke: no  Relevant comorbidities and cardiovascular risk factors: Obesity: yes There is no height or weight on file to calculate BMI.  Hypertension: yes Hyperlipidemia. yes  Current / Home Diabetic regimen includes:  T:slim insulin pump with Dexcom G7 uses Lyumjev U100  Insulin Pump setting:  Basal MN- 0.5u/hour 3:30AM-1.5  5AM- 1.4 6AM- 2.2 10AM- 0.750 11AM- 0.750 1PM-   1.6 3:30PM-1.9 4:30PM-1.8 8PM-     2.0 10PM-   1.8  Bolus CHO Ratio (1unit:CHO) MN- 1:12 3:30AM-1:12 5AM- 1:12 6AM- 1:12 10AM- 1:12 11AM- 1:5 1PM-    1:5 3:30PM-1:5 4:30PM-1:5.5 8PM-     1:5.5 10PM-   1:5.5   Correction/Sensitivity: MN- 1:25 3:30AM-1:25  5AM- 1:25 6AM- 1:25 10AM- 1:30 11AM- 1:25 1PM-   1:20 3:30PM-1:20 4:30PM-1:20 8PM-     1:25 10PM-   1:25  Target: 125  Active insulin time: 5 hours  -Metformin extended release 500 mg in the morning and 1000 mg in the evening.  Prior diabetic medications: Pioglitazone.  CONTINUOUS GLUCOSE MONITORING SYSTEM (CGMS) / INSULIN PUMP INTERPRETATION:    Tandem t:slim pump with Dexcom G7  Average total daily dose 64 units, basal 50%, bolus 50%, manual 56 control IQ 44%.  Average carb 111 g. Control IQ 100%.  CGM in use 100%.  GMI 6.9%.                         Impression :  Mostly acceptable blood sugar overnight, in the morning and early afternoon. Occasional hyperglycemia with blood sugar up to 230-250 range especially with supper and some time with lunch related to late meal bolus, possibility of carb counting accuracy and inadequate meal boluses.  Rare mild hypoglycemia following meal boluses due to stacking from correctional bolus as well.  No significant hypoglycemia.  Hypoglycemia: Patient has minor hypoglycemic episodes. Patient has hypoglycemia awareness.  Factors modifying glucose control: 1.  Diabetic diet assessment: 3 meals a day.  Usually eats outside on Friday evening and weekend  2.  Staying active or exercising: Walking regularly.  3.  Medication compliance: compliant all of the time.  Interval history  Pump and CGM data as reviewed above.  Recent hemoglobin A1c 6.7%.  She finds some time difficult to calculate actual carb counting especially with outside food.  She has no complaints today.  REVIEW OF SYSTEMS As per history of present illness.   PAST MEDICAL HISTORY: Past Medical History:  Diagnosis Date   Arthritis    OA   Atrial flutter (HCC)    a. s/p TEE/DCCV 08/16/13 (normal LV function, no LAA thrombus).b. s/p ablation by Dr Ladona Ridgel  09-23-2013   Coronary artery disease    2009 LAD Promus stent   Dysrhythmia    Aflutter, no issues after ablation 2014   Fibroid    Hyperlipidemia    Insulin pump in place    Osteopenia    Type 1 diabetes mellitus on insulin therapy (HCC)    Valvular heart disease    a. Mild MR/TR by TEE 08/2013.    PAST SURGICAL HISTORY: Past Surgical History:  Procedure Laterality Date   ABLATION  09/23/2013   CTI by Dr Ladona Ridgel   APPENDECTOMY     APPLICATION OF WOUND VAC Left 04/11/2017   Procedure: APPLICATION OF WOUND VAC LEFT KNEE;  Surgeon: Samson Frederic, MD;  Location: [facility] OR;  Service: Orthopedics;  Laterality: Left;   ATRIAL FLUTTER ABLATION N/A 09/23/2013   Procedure: ATRIAL FLUTTER ABLATION;  Surgeon: Marinus Maw, MD;  Location: [facility] CATH LAB;  Service: Cardiovascular;  Laterality: N/A;   CARDIOVERSION N/A 08/16/2013   Procedure: CARDIOVERSION;  Surgeon: Vesta Mixer, MD;  Location: [facility] ENDOSCOPY;  Service: Cardiovascular;  Laterality: N/A;  spoke with Tom   CATARACT EXTRACTION Bilateral    COLONOSCOPY     CORONARY ANGIOPLASTY WITH STENT PLACEMENT     LASER ABLATION Right 11/20/2023   Right leg laser ablation of the Greater saphenous vein with >20 stab phlebectomies   MYOMECTOMY  1977   ORIF PATELLA Left 04/11/2017   ORIF PATELLA Left 04/11/2017   Procedure: OPEN REDUCTION INTERNAL (ORIF) FIXATION PATELLA LEFT KNEE;  Surgeon: Samson Frederic, MD;  Location: [facility] OR;  Service: Orthopedics;  Laterality: Left;   PELVIC LAPAROSCOPY     TEE WITHOUT CARDIOVERSION N/A 08/16/2013   Procedure: TRANSESOPHAGEAL ECHOCARDIOGRAM (TEE);  Surgeon: Vesta Mixer, MD;  Location: [facility] ENDOSCOPY;  Service: Cardiovascular;  Laterality: N/A;    ALLERGIES: Allergies  Allergen Reactions   Sulfa Antibiotics Rash   Sulfamethoxazole-Trimethoprim Rash    FAMILY HISTORY:  Family History  Problem Relation Age of Onset   CAD Father 33       died age 92   CAD Brother 30       small vessel disease    Atrial fibrillation Mother    Diabetes Mother    Hypertension Mother    CAD Cousin        paternal   CAD Paternal Grandfather        early onset   Colon cancer Neg Hx     SOCIAL HISTORY: Social History   Socioeconomic History   Marital status: Married    Spouse name: Jesusita Oka   Number of children: 2   Years of education: 14   Highest education level: Bachelor's degree (e.g., BA, AB, BS)  Occupational History   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 adopted  Tobacco Use   Smoking status: Never   Smokeless tobacco: Never  Vaping Use   Vaping status: Never Used  Substance and Sexual Activity   Alcohol use: Yes    Alcohol/week: 5.0 standard drinks of alcohol    Types: 5 Glasses of wine per week    Comment: on weekends   Drug use: No   Sexual activity: Yes    Birth control/protection: Post-menopausal  Other Topics Concern   Not on file  Social History Narrative   Not on file   Social Drivers of Health   Financial Resource Strain: Low Risk  (10/31/2023)   Overall Financial Resource Strain (CARDIA)    Difficulty of Paying Living Expenses: Not hard at all  Food Insecurity: No Food Insecurity (10/31/2023)   Hunger Vital Sign    Worried About Running Out of Food in the Last Year: Never true    Ran Out of Food in the Last Year: Never true  Transportation Needs: No Transportation Needs (10/31/2023)   PRAPARE - Administrator, Civil Service (Medical): No    Lack of Transportation (Non-Medical): No  Physical Activity: Insufficiently Active (10/31/2023)   Exercise Vital Sign    Days of Exercise per Week: 3 days    Minutes of Exercise per Session: 30 min  Stress: No Stress Concern Present (10/31/2023)   [name] of Occupational Health - Occupational Stress Questionnaire    Feeling of Stress : Only a little  Social Connections: Socially Integrated (10/31/2023)   Social Connection and Isolation Panel [NHANES]    Frequency of Communication with Friends  and Family: More than three times a week    Frequency of Social Gatherings with Friends and Family: More than three times a week    Attends Religious Services: More than 4 times per year    Active Member of [employer] or Organizations: Yes    Attends Engineer, structural: More than 4 times per year    Marital Status: Married    MEDICATIONS:  Current Outpatient Medications  Medication Sig Dispense Refill   ASPIRIN 81 PO Take by mouth.     Calcium Carbonate-Vitamin D (CALCIUM + D PO) Take 600 mg by mouth 2 (two) times daily.     cholecalciferol (VITAMIN D) 1000 units tablet Take 1,000 Units by mouth 2 (two) times daily.      clopidogrel (PLAVIX) 75 MG tablet TAKE 1 TABLET DAILY (KEEP UPCOMING APPOINTMENT IN MARCH 2023 WITH DR. Elease Hashimoto BEFORE ANYMORE REFILLS) 90 tablet 2   Continuous Blood Gluc Sensor (DEXCOM G6 SENSOR) MISC by Does not apply route.     cyanocobalamin (VITAMIN B12) 1000 MCG tablet Take 1,000 mcg by mouth daily.     gabapentin (NEURONTIN) 100 MG capsule Take 2 capsules (200 mg total) by mouth at bedtime. 180 capsule 1   ibuprofen (ADVIL) 200 MG tablet Take 200 mg by mouth every 6 (six) hours as needed.     losartan (COZAAR) 25 MG tablet TAKE 1 TABLET DAILY 90 tablet 3   LYUMJEV 100 UNIT/ML SOLN USE A MAXIMUM OF 90 UNITS DAILY VIA INSULIN PUMP 80 mL 3   metoprolol succinate (TOPROL-XL) 100 MG 24 hr tablet TAKE 1 TABLET DAILY 90 tablet 3   Multiple Vitamins-Minerals (PRESERVISION AREDS 2 PO) Take by mouth in the morning and at bedtime.     nitroGLYCERIN (NITROSTAT) 0.4 MG SL tablet DISSOLVE 1 TABLET UNDER THE TONGUE EVERY 5 MINUTES AS NEEDED FOR CHEST PAIN 75 tablet  2   solifenacin (VESICARE) 10 MG tablet TAKE 1 TABLET DAILY 90 tablet 3   TART CHERRY PO Take by mouth.     ezetimibe (ZETIA) 10 MG tablet Take 1 tablet (10 mg total) by mouth daily. 90 tablet 3   metFORMIN (GLUCOPHAGE-XR) 500 MG 24 hr tablet TAKE 1 TABLET IN THE MORNING AND 2 TABLETS IN THE EVENING AS DIRECTED  270 tablet 3   rosuvastatin (CRESTOR) 40 MG tablet Take 1 tablet (40 mg total) by mouth daily. 90 tablet 3   No current facility-administered medications for this visit.    PHYSICAL EXAM: There were no vitals filed for this visit.   There is no height or weight on file to calculate BMI.  Wt Readings from Last 3 Encounters:  12/03/23 196 lb (88.9 kg)  11/20/23 195 lb (88.5 kg)  11/03/23 200 lb (90.7 kg)    General: Well developed, well nourished female in no apparent distress.  HEENT: AT/[place], no external lesions.  Eyes: Conjunctiva clear and no icterus. Neurologic: Alert, oriented, normal speech Psychiatric: Does not appear depressed or anxious  Diabetic Foot Exam - Simple   No data filed    LABS Reviewed Lab Results  Component Value Date   HGBA1C 6.7 (H) 12/22/2023   HGBA1C 6.6 (A) 09/25/2023   HGBA1C 7.0 (H) 06/27/2023   Lab Results  Component Value Date   FRUCTOSAMINE 270 10/06/2019   FRUCTOSAMINE 292 (H) 02/04/2017   FRUCTOSAMINE 278 05/11/2013   Lab Results  Component Value Date   CHOL 107 04/02/2023   HDL 40.00 04/02/2023   LDLCALC 39 04/02/2023   LDLDIRECT 79.0 01/11/2015   TRIG 136.0 04/02/2023   CHOLHDL 3 04/02/2023   Lab Results  Component Value Date   MICRALBCREAT 2.2 07/01/2023   MICRALBCREAT 1.2 07/11/2022   Lab Results  Component Value Date   CREATININE 0.63 12/22/2023   Lab Results  Component Value Date   GFR 67.08 04/02/2023    ASSESSMENT / PLAN  1. Type 1 diabetes mellitus without complications (HCC)   2. Mixed hyperlipidemia   3. Controlled diabetes mellitus type 1 without complications (HCC)     Diabetes Mellitus type 1, complicated by no known complications.  - Diabetic status / severity: controlled.   Lab Results  Component Value Date   HGBA1C 6.7 (H) 12/22/2023    - Hemoglobin A1c goal <7%   Adjusted carb ratio for the evening time as follows, due to hyperglycemia.  - Medications:  Tandem t:slim insulin pump on  control IQ using Dexcom G7.uses Lyumjev U100  Insulin Pump setting:  Basal MN- 0.5u/hour 3:30AM-1.5  5AM- 1.4 6AM- 2.2 10AM- 0.750 11AM- 0.750 1PM-   1.6 3:30PM-1.9 4:30PM-1.8 8PM-     2.0 10PM-   1.8  Bolus CHO Ratio (1unit:CHO) MN- 1:12 3:30AM-1:12 5AM- 1:12 6AM- 1:12 10AM- 1:12 11AM- 1:5 1PM-   1:5 3:30PM-1:5 4:30PM-1:5.5, changed to 1:5 8PM-     1:5.5, changed to 1:5 10PM-   1:5.5   Correction/Sensitivity: MN- 1:25 3:30AM-1:25  5AM- 1:25 6AM- 1:25 10AM- 1:30 11AM- 1:25 1PM-   1:20 3:30PM-1:20 4:30PM-1:20 8PM-     1:25 10PM-   1:25  Target: 125  Active insulin time: 5 hours  Advised patient to meal bolus before eating and also bolus for all snacks.   -Metformin extended release 500 mg in the morning and 1000 mg in the evening.  - Home glucose testing: continue CGM and check blood glucose as needed.  - Discussed/ Gave Hypoglycemia treatment plan.  #  Consult : not required at this time.   # Annual urine for microalbuminuria/ creatinine ratio, no microalbuminuria currently, continue ACE/ARB losartan. Last  Lab Results  Component Value Date   MICRALBCREAT 2.2 07/01/2023    # Foot check nightly.  # Annual dilated diabetic eye exams.   - Diet: Eat reasonable portion sizes to promote a healthy weight - Life style / activity / exercise: Discussed  2. Blood pressure  -  BP Readings from Last 1 Encounters:  12/03/23 (!) 153/71    - Control is in target. Blood pressure not measured today.  This is part of visit. - No change in current plans.  3. Lipid status / Hyperlipidemia - Last  Lab Results  Component Value Date   LDLCALC 39 04/02/2023   - Continue rosuvastatin 40 mg daily and Zetia 10 mg daily.  Diagnoses and all orders for this visit:  Type 1 diabetes mellitus without complications (HCC) -     BASIC METABOLIC PANEL WITH GFR -     Hemoglobin A1c  Mixed hyperlipidemia -     Lipid panel -     ezetimibe (ZETIA) 10 MG tablet;  Take 1 tablet (10 mg total) by mouth daily.  Controlled diabetes mellitus type 1 without complications (HCC) -     metFORMIN (GLUCOPHAGE-XR) 500 MG 24 hr tablet; TAKE 1 TABLET IN THE MORNING AND 2 TABLETS IN THE EVENING AS DIRECTED -     rosuvastatin (CRESTOR) 40 MG tablet; Take 1 tablet (40 mg total) by mouth daily.    DISPOSITION Follow up in clinic in 3 months suggested.   All questions answered and patient verbalized understanding of the plan.  [place] Jackquelyn Sundberg, MD [facility] Endocrinology [facility] [address] [place], [address] [place], [place] [address] Phone # [phone]  At least part of this note was generated using voice recognition software. Inadvertent word errors may have occurred, which were not recognized during the proofreading process.

## 2023-12-31 ENCOUNTER — Encounter: Payer: Self-pay | Admitting: Internal Medicine

## 2024-01-05 ENCOUNTER — Encounter: Payer: Self-pay | Admitting: Family Medicine

## 2024-01-05 ENCOUNTER — Telehealth: Payer: Self-pay

## 2024-01-05 MED ORDER — GABAPENTIN 100 MG PO CAPS: 200.0000 mg | ORAL_CAPSULE | Freq: Every day | ORAL | 0 refills | Status: DC

## 2024-01-05 NOTE — Telephone Encounter (Signed)
 Called [facility] to cancel the prescription that was sent to them in error earlier today. Patient is going to refill this prescription through [facility] instead. Spoke to the pharmacist and she stated that the prescription was cancelled successfully.

## 2024-01-07 ENCOUNTER — Ambulatory Visit (INDEPENDENT_AMBULATORY_CARE_PROVIDER_SITE_OTHER): Payer: Medicare Other

## 2024-01-07 DIAGNOSIS — I83813 Varicose veins of bilateral lower extremities with pain: Secondary | ICD-10-CM | POA: Diagnosis not present

## 2024-01-07 NOTE — Progress Notes (Signed)
 Treated pt's BLE spider and reticular veins with Asclera 1% administered with a 27 gauge butterfly needle. Pt received a total of 6 ml/60 mg of Asclera 1%. Pt tolerated well. She has a lot of spider and reticular veins and I treated as much as I could with three vials. She may be interested in paying out of pocket in the future for another treatment. She had a lot of areas I could not treat due to how many spider veins she had. She was given post treatment instructions on handout and verbally and will call if she has any questions/concerns. She was placed in 20-30 mm Hg thigh high compression hose at the end of treatment.

## 2024-01-15 ENCOUNTER — Encounter: Payer: Self-pay | Admitting: Internal Medicine

## 2024-01-15 ENCOUNTER — Encounter: Payer: Self-pay | Admitting: Cardiovascular Disease

## 2024-01-15 NOTE — Progress Notes (Unsigned)
 Suzi Roots Date of Birth  1946-01-11 [facility]          [address]    [address]     [place], [place]  [address]           Problems. 1. CAD 2. Atrial Flutter - s/p Aflutter ablation Nov. 2014.  3. Diabetes Mellitus.    [name] is doing very well. She's not having episodes of chest pain or shortness of breath. She's been able to below of her normal activities without any significant problems. She has not had chest pains .  Her insurance will run out the month before she goes on Medicare.   She has not had any CP and no palpitations.    April 08, 2013:   [name] is doing well. No CP.  Rhythm is stable.     Mar 11, 2014:  [name] is doing well.  She had a atrial flutter ablation several months ago.  She has not been taking her metoprolol..  Nov. 9, 2015:  [name] is doing well. No Cp , no dyspnea.  Active, not exerciseing as much as she would like.  Mar 16, 2015:   [name] is doing well.   Doing well from a cardiac standpoint.   Nov. 10, 2016  Lots of stress - her 76 yo mom is living with her.  Will not let anyone else do anything Lots of stress No CP , no arrhythmias No time to exercise   Mar 20, 2016:  Doing well Her mother is living back in [place].   Stress is a bit less.  Exercising some .   Nov. 22, 2017.  Doing well . Had afib ablation several years ago and feels great.  DM is well controlled.  Has UTIs frequently .   November 17, 2016: Seen back today for follow-up of her coronary artery disease. Doing well Falls occasionally ,  Broke her left kneecap and left ankle this past year.  Takes the niacin at night and the ASA 81 mg does not conrol the niacin flushng like the 325 mg niacin does.   Jan. 14, 2020  [name] is seen today . No CP or dyspnea  Exercising some.   Diabetes is well controlled.   Jan. 18, 2021  [name] is seen toda for follow up of her  CAD, atrial flutter, DM, hyperlipidemia Doing well.    VS look  great Exercising regularly .  No CP or dyspnea.  Is on ASA 325 for the effects of niacin at bedtime   May 22, 2020    [name] is seen today for follow up of her CAD, atrial flutter, DM, HLD Her daughter and family have moved back to [facility]  No cp, no dyspnea,   No palpitations suggestive of atrial flutter.   Jan. 24, 2022:  [name] is seen today for follow up of her CAD, atrial flutter, DM, HLD Doing well.  No cardiac issues  Diabetes is doing well. Has a new insulin pump  HbA1C looks good   January 18, 2022: [name] is seen today for follow-up of her coronary artery disease, atrial flutter, diabetes, hyperlipidemia.  No CP , no dyspnea  Is not getting enough exercise  Needs to do more cardio  January 21, 2023 [name] is seen today for follow up of her atrial flutter, DM, HLD Wt is 211 No CP, no dyspnea Some exercise  Tries to exercise more .   Just now putting away her  [employer] glass Christmas Trees    January 16, 2024 [name] is seen for follow up of her atrial flutter, DM, CAD, HLD   Does not check her BP regularly  Is getting some exercise  Several times a week .     Her daughter has a new ladies shop  [employer] [place]   Current Outpatient Medications on File Prior to Visit  Medication Sig Dispense Refill   ASPIRIN 81 PO Take by mouth.     Calcium Carbonate-Vitamin D (CALCIUM + D PO) Take 600 mg by mouth 2 (two) times daily.     cholecalciferol (VITAMIN D) 1000 units tablet Take 1,000 Units by mouth 2 (two) times daily.      clopidogrel (PLAVIX) 75 MG tablet TAKE 1 TABLET DAILY (KEEP UPCOMING APPOINTMENT IN MARCH 2023 WITH DR. Elease Hashimoto BEFORE ANYMORE REFILLS) 90 tablet 2   Continuous Blood Gluc Sensor (DEXCOM G6 SENSOR) MISC by Does not apply route.     cyanocobalamin (VITAMIN B12) 1000 MCG tablet Take 1,000 mcg by mouth daily.     ezetimibe (ZETIA) 10 MG tablet Take 1 tablet (10 mg total) by mouth daily. 90 tablet 3   gabapentin (NEURONTIN) 100 MG  capsule Take 2 capsules (200 mg total) by mouth at bedtime. 180 capsule 0   ibuprofen (ADVIL) 200 MG tablet Take 200 mg by mouth every 6 (six) hours as needed.     LYUMJEV 100 UNIT/ML SOLN USE A MAXIMUM OF 90 UNITS DAILY VIA INSULIN PUMP 80 mL 3   metFORMIN (GLUCOPHAGE-XR) 500 MG 24 hr tablet TAKE 1 TABLET IN THE MORNING AND 2 TABLETS IN THE EVENING AS DIRECTED 270 tablet 3   metoprolol succinate (TOPROL-XL) 100 MG 24 hr tablet TAKE 1 TABLET DAILY 90 tablet 3   Multiple Vitamins-Minerals (PRESERVISION AREDS 2 PO) Take by mouth in the morning and at bedtime.     nitroGLYCERIN (NITROSTAT) 0.4 MG SL tablet DISSOLVE 1 TABLET UNDER THE TONGUE EVERY 5 MINUTES AS NEEDED FOR CHEST PAIN 75 tablet 2   Pyridoxine HCl (VITAMIN B6) 100 MG TABS      rosuvastatin (CRESTOR) 40 MG tablet Take 1 tablet (40 mg total) by mouth daily. 90 tablet 3   solifenacin (VESICARE) 10 MG tablet TAKE 1 TABLET DAILY 90 tablet 3   TART CHERRY PO Take by mouth.     No current facility-administered medications on file prior to visit.    Allergies  Allergen Reactions   Sulfa Antibiotics Rash   Sulfamethoxazole-Trimethoprim Rash    Past Medical History:  Diagnosis Date   Arthritis    OA   Atrial flutter (HCC)    a. s/p TEE/DCCV 08/16/13 (normal LV function, no LAA thrombus).b. s/p ablation by Dr Ladona Ridgel 09-23-2013   Coronary artery disease    2009 LAD Promus stent   Dysrhythmia    Aflutter, no issues after ablation 2014   Fibroid    Hyperlipidemia    Insulin pump in place    Osteopenia    Type 1 diabetes mellitus on insulin therapy (HCC)    Valvular heart disease    a. Mild MR/TR by TEE 08/2013.    Past Surgical History:  Procedure Laterality Date   ABLATION  09/23/2013   CTI by Dr Ladona Ridgel   APPENDECTOMY     APPLICATION OF WOUND VAC Left 04/11/2017   Procedure: APPLICATION OF WOUND VAC LEFT KNEE;  Surgeon: Samson Frederic, MD;  Location: [facility] OR;  Service: Orthopedics;  Laterality: Left;  ATRIAL FLUTTER  ABLATION N/A 09/23/2013   Procedure: ATRIAL FLUTTER ABLATION;  Surgeon: Marinus Maw, MD;  Location: [facility] CATH LAB;  Service: Cardiovascular;  Laterality: N/A;   CARDIOVERSION N/A 08/16/2013   Procedure: CARDIOVERSION;  Surgeon: Vesta Mixer, MD;  Location: [facility] ENDOSCOPY;  Service: Cardiovascular;  Laterality: N/A;  spoke with Tom   CATARACT EXTRACTION Bilateral    COLONOSCOPY     CORONARY ANGIOPLASTY WITH STENT PLACEMENT     LASER ABLATION Right 11/20/2023   Right leg laser ablation of the Greater saphenous vein with >20 stab phlebectomies   MYOMECTOMY  1977   ORIF PATELLA Left 04/11/2017   ORIF PATELLA Left 04/11/2017   Procedure: OPEN REDUCTION INTERNAL (ORIF) FIXATION PATELLA LEFT KNEE;  Surgeon: Samson Frederic, MD;  Location: [facility] OR;  Service: Orthopedics;  Laterality: Left;   PELVIC LAPAROSCOPY     TEE WITHOUT CARDIOVERSION N/A 08/16/2013   Procedure: TRANSESOPHAGEAL ECHOCARDIOGRAM (TEE);  Surgeon: Vesta Mixer, MD;  Location: [facility] ENDOSCOPY;  Service: Cardiovascular;  Laterality: N/A;    Social History   Tobacco Use  Smoking Status Never  Smokeless Tobacco Never    Social History   Substance and Sexual Activity  Alcohol Use Yes   Alcohol/week: 5.0 standard drinks of alcohol   Types: 5 Glasses of wine per week   Comment: on weekends    Family History  Problem Relation Age of Onset   CAD Father 68       died age 51   CAD Brother 30       small vessel disease   Atrial fibrillation Mother    Diabetes Mother    Hypertension Mother    CAD Cousin        paternal   CAD Paternal Grandfather        early onset   Colon cancer Neg Hx     Reviw of Systems:  Reviewed in the HPI.  All other systems are negative.   Physical Exam: Blood pressure (!) 145/75, pulse 86, resp. rate 18, height 5\' 8"  (1.727 m), weight 198 lb (89.8 kg), SpO2 97%.  HYPERTENSION CONTROL Vitals:   01/16/24 1050 01/16/24 1139  BP: (!) 142/62 (!) 145/75    The patient's blood pressure is  elevated above target today.  In order to address the patient's elevated BP: A current anti-hypertensive medication was adjusted today.       GEN:  Well nourished, well developed in no acute distress HEENT: Normal NECK: No JVD; No carotid bruits LYMPHATICS: No lymphadenopathy CARDIAC: RRR , no murmurs, rubs, gallops RESPIRATORY:  basilar rales bilaterally ,  perhaps worse on right base.  ABDOMEN: Soft, non-tender, non-distended MUSCULOSKELETAL:  No edema; No deformity  SKIN: Warm and dry NEUROLOGIC:  Alert and oriented x 3    ECG:    EKG Interpretation Date/Time:  Friday January 16 2024 10:55:28 EDT Ventricular Rate:  68 PR Interval:  156 QRS Duration:  90 QT Interval:  428 QTC Calculation: 455 R Axis:   46  Text Interpretation: Normal sinus rhythm Nonspecific T wave abnormality When compared with ECG of 24-Sep-2013 03:26, No significant change was found Confirmed by Kristeen Miss ([id number]) on 01/16/2024 11:34:19 AM     Assessment / Plan:   1. CAD -   she is not having any episodes of chest pain.   2. Atrial Flutter -remains in normal sinus rhythm.  3. Diabetes Mellitus.    Manged by endocrinology     4. Hperlipidemia:    -  continue same medications   5. Weight gain:   advised more exercise   6.  Hypertension: Will increase her losartan to 50 mg a day.  Stressed the importance of weight loss and low-salt diet. BMP is 3 weeks.     Kristeen Miss, MD  01/16/2024 11:51 AM    [facility] [address] [place],  [address] [place], [place]  [address] Pager [phone] Phone: [phone]; Fax: [phone]

## 2024-01-16 ENCOUNTER — Ambulatory Visit: Payer: Medicare Other | Attending: Cardiology | Admitting: Cardiovascular Disease

## 2024-01-16 VITALS — BP 145/75 | HR 86 | Resp 18 | Ht 68.0 in | Wt 198.0 lb

## 2024-01-16 DIAGNOSIS — Z79899 Other long term (current) drug therapy: Secondary | ICD-10-CM | POA: Diagnosis not present

## 2024-01-16 DIAGNOSIS — I25119 Atherosclerotic heart disease of native coronary artery with unspecified angina pectoris: Secondary | ICD-10-CM | POA: Insufficient documentation

## 2024-01-16 MED ORDER — LOSARTAN POTASSIUM 50 MG PO TABS: 50.0000 mg | ORAL_TABLET | Freq: Every day | ORAL | 3 refills | Status: DC

## 2024-01-16 NOTE — Patient Instructions (Addendum)
 Labs: BMET in 3 weeks  Medication Instructions:  INCREASE Losartan to 50mg  daily *If you need a refill on your cardiac medications before your next appointment, please call your pharmacy*  Follow-Up: At [facility], you and your health needs are our priority.  As part of our continuing mission to provide you with exceptional heart care, we have created designated Provider Care Teams.  These Care Teams include your primary Cardiologist (physician) and Advanced Practice Providers (APPs -  Physician Assistants and Nurse Practitioners) who all work together to provide you with the care you need, when you need it.  Your next appointment:   1 year(s)  Provider:   Kristeen Miss, MD      1st Floor: - Lobby - Registration  - Pharmacy  - Lab - Cafe  2nd Floor: - PV Lab - Diagnostic Testing (echo, CT, nuclear med)  3rd Floor: - Vacant  4th Floor: - TCTS (cardiothoracic surgery) - AFib Clinic - Structural Heart Clinic - Vascular Surgery  - Vascular Ultrasound  5th Floor: - HeartCare Cardiology (general and EP) - Clinical Pharmacy for coumadin, hypertension, lipid, weight-loss medications, and med management appointments    Valet parking services will be available as well.

## 2024-01-26 ENCOUNTER — Encounter: Payer: Self-pay | Admitting: Cardiovascular Disease

## 2024-01-26 DIAGNOSIS — Z79899 Other long term (current) drug therapy: Secondary | ICD-10-CM

## 2024-01-27 ENCOUNTER — Ambulatory Visit (INDEPENDENT_AMBULATORY_CARE_PROVIDER_SITE_OTHER): Payer: Medicare Other

## 2024-01-27 VITALS — BP 142/68 | HR 69 | Ht 67.0 in | Wt 197.8 lb

## 2024-01-27 DIAGNOSIS — Z0000 Encounter for general adult medical examination without abnormal findings: Secondary | ICD-10-CM

## 2024-01-27 NOTE — Progress Notes (Signed)
 Subjective:   [name] is a 78 y.o. who presents for a Medicare Wellness preventive visit.  Visit Complete: In person  Persons Participating in Visit: Patient.  AWV Questionnaire: No: Patient Medicare AWV questionnaire was not completed prior to this visit.  Cardiac Risk Factors include: advanced age (>74men, >72 women);diabetes mellitus;dyslipidemia;hypertension;obesity (BMI >30kg/m2)     Objective:    Today's Vitals   01/27/24 0953 01/27/24 1015  BP: (!) 142/68 (!) 142/68  Pulse: 69   Weight: 197 lb 12.8 oz (89.7 kg)   Height: 5\' 7"  (1.702 m)    Body mass index is 30.98 kg/m.     01/27/2024    9:55 AM 09/10/2022   10:29 AM 11/02/2021    9:40 AM 05/14/2021   12:49 PM 03/20/2021   11:16 AM 04/20/2018   10:22 AM 04/11/2017    6:45 AM  Advanced Directives  Does Patient Have a Medical Advance Directive? Yes Yes No Yes Yes Yes Yes  Type of Estate agent of [place];Living will Healthcare Power of [place];Living will  Healthcare Power of [place];Living will Healthcare Power of [place];Living will  Healthcare Power of [place];Living will  Does patient want to make changes to medical advance directive?  No - Patient declined  No - Patient declined No - Patient declined No - Patient declined No - Patient declined  Copy of Healthcare Power of Attorney in Chart? No - copy requested No - copy requested  No - copy requested No - copy requested  No - copy requested    Current Medications (verified) Outpatient Encounter Medications as of 01/27/2024  Medication Sig   ASPIRIN 81 PO Take by mouth.   Calcium Carbonate-Vitamin D (CALCIUM + D PO) Take 600 mg by mouth 2 (two) times daily.   cholecalciferol (VITAMIN D) 1000 units tablet Take 1,000 Units by mouth 2 (two) times daily.    clopidogrel (PLAVIX) 75 MG tablet TAKE 1 TABLET DAILY (KEEP UPCOMING APPOINTMENT IN MARCH 2023 WITH DR. Elease Hashimoto BEFORE ANYMORE REFILLS)   Continuous Blood Gluc Sensor (DEXCOM G6  SENSOR) MISC by Does not apply route.   cyanocobalamin (VITAMIN B12) 1000 MCG tablet Take 1,000 mcg by mouth daily.   ezetimibe (ZETIA) 10 MG tablet Take 1 tablet (10 mg total) by mouth daily.   gabapentin (NEURONTIN) 100 MG capsule Take 2 capsules (200 mg total) by mouth at bedtime.   ibuprofen (ADVIL) 200 MG tablet Take 200 mg by mouth every 6 (six) hours as needed.   losartan (COZAAR) 50 MG tablet Take 1 tablet (50 mg total) by mouth daily.   LYUMJEV 100 UNIT/ML SOLN USE A MAXIMUM OF 90 UNITS DAILY VIA INSULIN PUMP   metFORMIN (GLUCOPHAGE-XR) 500 MG 24 hr tablet TAKE 1 TABLET IN THE MORNING AND 2 TABLETS IN THE EVENING AS DIRECTED   metoprolol succinate (TOPROL-XL) 100 MG 24 hr tablet TAKE 1 TABLET DAILY   Multiple Vitamins-Minerals (PRESERVISION AREDS 2 PO) Take by mouth in the morning and at bedtime.   nitroGLYCERIN (NITROSTAT) 0.4 MG SL tablet DISSOLVE 1 TABLET UNDER THE TONGUE EVERY 5 MINUTES AS NEEDED FOR CHEST PAIN   Pyridoxine HCl (VITAMIN B6) 100 MG TABS    rosuvastatin (CRESTOR) 40 MG tablet Take 1 tablet (40 mg total) by mouth daily.   solifenacin (VESICARE) 10 MG tablet TAKE 1 TABLET DAILY   TART CHERRY PO Take by mouth.   No facility-administered encounter medications on file as of 01/27/2024.    Allergies (verified) Sulfa antibiotics and Sulfamethoxazole-trimethoprim  History: Past Medical History:  Diagnosis Date   Arthritis    OA   Atrial flutter (HCC)    a. s/p TEE/DCCV 08/16/13 (normal LV function, no LAA thrombus).b. s/p ablation by Dr Ladona Ridgel 09-23-2013   Coronary artery disease    2009 LAD Promus stent   Dysrhythmia    Aflutter, no issues after ablation 2014   Fibroid    Hyperlipidemia    Insulin pump in place    Osteopenia    Type 1 diabetes mellitus on insulin therapy (HCC)    Valvular heart disease    a. Mild MR/TR by TEE 08/2013.   Past Surgical History:  Procedure Laterality Date   ABLATION  09/23/2013   CTI by Dr Ladona Ridgel   APPENDECTOMY      APPLICATION OF WOUND VAC Left 04/11/2017   Procedure: APPLICATION OF WOUND VAC LEFT KNEE;  Surgeon: Samson Frederic, MD;  Location: [facility] OR;  Service: Orthopedics;  Laterality: Left;   ATRIAL FLUTTER ABLATION N/A 09/23/2013   Procedure: ATRIAL FLUTTER ABLATION;  Surgeon: Marinus Maw, MD;  Location: [facility] CATH LAB;  Service: Cardiovascular;  Laterality: N/A;   CARDIOVERSION N/A 08/16/2013   Procedure: CARDIOVERSION;  Surgeon: Vesta Mixer, MD;  Location: [facility] ENDOSCOPY;  Service: Cardiovascular;  Laterality: N/A;  spoke with Tom   CATARACT EXTRACTION Bilateral    COLONOSCOPY     CORONARY ANGIOPLASTY WITH STENT PLACEMENT     LASER ABLATION Right 11/20/2023   Right leg laser ablation of the Greater saphenous vein with >20 stab phlebectomies   MYOMECTOMY  1977   ORIF PATELLA Left 04/11/2017   ORIF PATELLA Left 04/11/2017   Procedure: OPEN REDUCTION INTERNAL (ORIF) FIXATION PATELLA LEFT KNEE;  Surgeon: Samson Frederic, MD;  Location: [facility] OR;  Service: Orthopedics;  Laterality: Left;   PELVIC LAPAROSCOPY     TEE WITHOUT CARDIOVERSION N/A 08/16/2013   Procedure: TRANSESOPHAGEAL ECHOCARDIOGRAM (TEE);  Surgeon: Vesta Mixer, MD;  Location: [facility] ENDOSCOPY;  Service: Cardiovascular;  Laterality: N/A;   Family History  Problem Relation Age of Onset   CAD Father 67       died age 32   CAD Brother 43       small vessel disease   Atrial fibrillation Mother    Diabetes Mother    Hypertension Mother    CAD Cousin        paternal   CAD Paternal Grandfather        early onset   Colon cancer Neg Hx    Social History   Socioeconomic History   Marital status: Married    Spouse name: Jesusita Oka   Number of children: 2   Years of education: 14   Highest education level: Bachelor's degree (e.g., BA, AB, BS)  Occupational History   Occupation: home maker    Comment: Children adopted  Tobacco Use   Smoking status: Never    Passive exposure: Never   Smokeless tobacco: Never  Vaping Use   Vaping status:  Never Used  Substance and Sexual Activity   Alcohol use: Yes    Alcohol/week: 5.0 standard drinks of alcohol    Types: 5 Glasses of wine per week    Comment: on weekends   Drug use: No   Sexual activity: Yes    Birth control/protection: Post-menopausal  Other Topics Concern   Not on file  Social History Narrative   Not on file   Social Drivers of Health   Financial Resource Strain: Low Risk  (01/27/2024)  Overall Financial Resource Strain (CARDIA)    Difficulty of Paying Living Expenses: Not hard at all  Food Insecurity: No Food Insecurity (01/27/2024)   Hunger Vital Sign    Worried About Running Out of Food in the Last Year: Never true    Ran Out of Food in the Last Year: Never true  Transportation Needs: No Transportation Needs (01/27/2024)   PRAPARE - Administrator, Civil Service (Medical): No    Lack of Transportation (Non-Medical): No  Physical Activity: Sufficiently Active (01/27/2024)   Exercise Vital Sign    Days of Exercise per Week: 7 days    Minutes of Exercise per Session: 30 min  Recent Concern: Physical Activity - Insufficiently Active (10/31/2023)   Exercise Vital Sign    Days of Exercise per Week: 3 days    Minutes of Exercise per Session: 30 min  Stress: No Stress Concern Present (01/27/2024)   [name] of Occupational Health - Occupational Stress Questionnaire    Feeling of Stress : Not at all  Social Connections: Socially Integrated (01/27/2024)   Social Connection and Isolation Panel [NHANES]    Frequency of Communication with Friends and Family: More than three times a week    Frequency of Social Gatherings with Friends and Family: More than three times a week    Attends Religious Services: More than 4 times per year    Active Member of [employer] or Organizations: Yes    Attends Engineer, structural: More than 4 times per year    Marital Status: Married    Tobacco Counseling Counseling given: No    Clinical  Intake:  Pre-visit preparation completed: Yes  Pain : No/denies pain     BMI - recorded: 30.98 Nutritional Status: BMI > 30  Obese Nutritional Risks: None Diabetes: Yes CBG done?: Yes CBG resulted in Enter/ Edit results?: Yes (results -84) Did pt. bring in CBG monitor from home?: No  Lab Results  Component Value Date   HGBA1C 6.7 (H) 12/22/2023   HGBA1C 6.6 (A) 09/25/2023   HGBA1C 7.0 (H) 06/27/2023     How often do you need to have someone help you when you read instructions, pamphlets, or other written materials from your doctor or pharmacy?: 1 - Never  Interpreter Needed?: No  Information entered by :: Hassell Halim, CMA   Activities of Daily Living     01/27/2024    9:59 AM  In your present state of health, do you have any difficulty performing the following activities:  Hearing? 0  Vision? 0  Difficulty concentrating or making decisions? 0  Walking or climbing stairs? 0  Dressing or bathing? 0  Doing errands, shopping? 0  Preparing Food and eating ? N  Using the Toilet? N  In the past six months, have you accidently leaked urine? Y  Comment wears a depend  Do you have problems with loss of bowel control? N  Managing your Medications? N  Managing your Finances? N  Housekeeping or managing your Housekeeping? N    Patient Care Team: Myrlene Broker, MD as PCP - General (Internal Medicine) [name], Deloris Ping, MD as PCP - Cardiology (Cardiology) [name], Deloris Ping, MD (Cardiology) Leta Speller, MD (Dermatology) Marinus Maw, MD (Cardiology) Hart Carwin, MD (Inactive) (Gastroenterology) Judi Saa, DO (Sports Medicine) [place], Heather, OD as Consulting Physician (Optometry) Szabat, Vinnie Level, [facility] (Inactive) (Pharmacist) [name], Kirstie Peri, DPM as Consulting Physician (Podiatry) Thapa, [place], MD as Consulting Physician (Endocrinology)  Indicate any  recent Medical Services you may have received from other than [facility] providers in the past year (date  may be approximate).     Assessment:   This is a routine wellness examination for [place]le.  Hearing/Vision screen Hearing Screening - Comments:: Wears 1 left ear hearing aid Vision Screening - Comments:: Wears rx glasses - up to date with routine eye exams with Dr Heather [place]   Goals Addressed               This Visit's Progress     Patient Stated (pt-stated)        Patient stated she plans to monitor blood pressure.       Depression Screen     01/27/2024   10:02 AM 07/04/2023    2:22 PM 11/12/2022   10:57 AM 11/01/2022    2:55 PM 09/25/2022   10:00 AM 09/10/2022   10:21 AM 01/08/2022   10:32 AM  PHQ 2/9 Scores  PHQ - 2 Score 0 0 0 0 0 0 0  PHQ- 9 Score 0   0 2      Fall Risk     01/27/2024   10:00 AM 11/03/2023    3:17 PM 07/04/2023    2:22 PM 03/18/2023    4:06 PM 11/12/2022   10:57 AM  Fall Risk   Falls in the past year? 0 0 0 1 0  Number falls in past yr: 0 0 0 0 0  Injury with Fall? 0 0 0 0 0  Risk for fall due to : No Fall Risks  No Fall Risks  No Fall Risks  Follow up Falls prevention discussed;Falls evaluation completed Falls evaluation completed Falls evaluation completed Falls evaluation completed Falls evaluation completed    MEDICARE RISK AT HOME:  Medicare Risk at Home Any stairs in or around the home?: Yes (outside) If so, are there any without handrails?: No Home free of loose throw rugs in walkways, pet beds, electrical cords, etc?: Yes Adequate lighting in your home to reduce risk of falls?: Yes Life alert?: No Use of a cane, walker or w/c?: No Grab bars in the bathroom?: No Shower chair or bench in shower?: Yes Elevated toilet seat or a handicapped toilet?: No  TIMED UP AND GO:  Was the test performed?  No  Cognitive Function: 6CIT completed        01/27/2024   10:03 AM 09/10/2022   10:30 AM  6CIT Screen  What Year? 0 points 0 points  What month? 0 points 0 points  What time? 0 points 0 points  Count back from 20 0 points 0 points   Months in reverse 0 points 0 points  Repeat phrase 2 points 0 points  Total Score 2 points 0 points    Immunizations Immunization History  Administered Date(s) Administered   Fluad Quad(high Dose 65+) 07/15/2022   Hep A / Hep B 06/13/2016   Influenza Split 08/04/2012   Influenza, High Dose Seasonal PF 07/18/2016, 08/12/2017, 07/28/2018, 07/28/2018, 08/04/2021   Influenza,inj,Quad PF,6+ Mos 08/09/2013, 08/11/2014   Influenza-Unspecified 08/16/2015, 08/13/2020, 07/30/2023   PFIZER(Purple Top)SARS-COV-2 Vaccination 11/23/2019, 12/14/2019, 07/18/2020, 03/04/2021, 07/05/2021   PNEUMOCOCCAL CONJUGATE-20 04/05/2022, 05/05/2022   Pfizer Covid-19 Vaccine Bivalent Booster 80yrs & up 05/06/2022, 01/30/2023   Pneumococcal Conjugate-13 11/07/2014   Pneumococcal Polysaccharide-23 11/05/2004, 09/24/2013   Pneumococcal-Unspecified 05/05/2022   Td 01/14/1997   Tdap 03/25/2012, 07/15/2022   Unspecified SARS-COV-2 Vaccination 07/30/2023   Zoster Recombinant(Shingrix) 08/18/2018, 10/22/2018   Zoster, Live 11/04/2010  Screening Tests Health Maintenance  Topic Date Due   OPHTHALMOLOGY EXAM  12/03/2023   COVID-19 Vaccine (9 - Pfizer risk 2024-25 season) 01/27/2024   HEMOGLOBIN A1C  06/20/2024   Diabetic kidney evaluation - Urine ACR  06/30/2024   FOOT EXAM  07/03/2024   Diabetic kidney evaluation - eGFR measurement  12/21/2024   Medicare Annual Wellness (AWV)  01/26/2025   DTaP/Tdap/Td (4 - Td or Tdap) 07/15/2032   Pneumonia Vaccine 70+ Years old  Completed   INFLUENZA VACCINE  Completed   DEXA SCAN  Completed   Hepatitis C Screening  Completed   Zoster Vaccines- Shingrix  Completed   HPV VACCINES  Aged Out   Colonoscopy  Discontinued    Health Maintenance  Health Maintenance Due  Topic Date Due   OPHTHALMOLOGY EXAM  12/03/2023   COVID-19 Vaccine (9 - Pfizer risk 2024-25 season) 01/27/2024   Health Maintenance Items Addressed: 01/27/2024  Foot Exam status: Completed by PCP  07/04/23. Pt has an appt in 03/2024 w/Dr Thapa.  Additional Screening:  Vision Screening: Recommended annual ophthalmology exams for early detection of glaucoma and other disorders of the eye.  Dental Screening: Recommended annual dental exams for proper oral hygiene  Community Resource Referral / Chronic Care Management: CRR required this visit?  No   CCM required this visit?  No     Plan:     I have personally reviewed and noted the following in the patient's chart:   Medical and social history Use of alcohol, tobacco or illicit drugs  Current medications and supplements including opioid prescriptions. Patient is not currently taking opioid prescriptions. Functional ability and status Nutritional status Physical activity Advanced directives List of other physicians Hospitalizations, surgeries, and ER visits in previous 12 months Vitals Screenings to include cognitive, depression, and falls Referrals and appointments  In addition, I have reviewed and discussed with patient certain preventive protocols, quality metrics, and best practice recommendations. A written personalized care plan for preventive services as well as general preventive health recommendations were provided to patient.     Darreld Mclean, CMA   01/27/2024   After Visit Summary: (MyChart) Due to this being a telephonic visit, the after visit summary with patients personalized plan was offered to patient via MyChart   Notes: Nothing significant to report at this time.

## 2024-01-27 NOTE — Patient Instructions (Signed)
 [name] , Thank you for taking time to come for your Medicare Wellness Visit. I appreciate your ongoing commitment to your health goals. Please review the following plan we discussed and let me know if I can assist you in the future.   Referrals/Orders/Follow-Ups/Clinician Recommendations: Aim for 30 minutes of exercise or brisk walking, 6-8 glasses of water, and 5 servings of fruits and vegetables each day.   This is a list of the screening recommended for you and due dates:  Health Maintenance  Topic Date Due   Eye exam for diabetics  12/03/2023   COVID-19 Vaccine (9 - Pfizer risk 2024-25 season) 01/27/2024   Hemoglobin A1C  06/20/2024   Yearly kidney health urinalysis for diabetes  06/30/2024   Complete foot exam   07/03/2024   Yearly kidney function blood test for diabetes  12/21/2024   Medicare Annual Wellness Visit  01/26/2025   DTaP/Tdap/Td vaccine (4 - Td or Tdap) 07/15/2032   Pneumonia Vaccine  Completed   Flu Shot  Completed   DEXA scan (bone density measurement)  Completed   Hepatitis C Screening  Completed   Zoster (Shingles) Vaccine  Completed   HPV Vaccine  Aged Out   Colon Cancer Screening  Discontinued    Advanced directives: (Copy Requested) Please bring a copy of your health care power of attorney and living will to the office to be added to your chart at your convenience. You can mail to [facility] [address] [place], [place] [address] or email to [email or web address]  Next Medicare Annual Wellness Visit scheduled for next year: Yes

## 2024-02-04 DIAGNOSIS — H57813 Brow ptosis, bilateral: Secondary | ICD-10-CM | POA: Diagnosis not present

## 2024-02-04 DIAGNOSIS — H02412 Mechanical ptosis of left eyelid: Secondary | ICD-10-CM | POA: Diagnosis not present

## 2024-02-04 DIAGNOSIS — Z01818 Encounter for other preprocedural examination: Secondary | ICD-10-CM | POA: Diagnosis not present

## 2024-02-04 DIAGNOSIS — H02411 Mechanical ptosis of right eyelid: Secondary | ICD-10-CM | POA: Diagnosis not present

## 2024-02-04 DIAGNOSIS — H02834 Dermatochalasis of left upper eyelid: Secondary | ICD-10-CM | POA: Diagnosis not present

## 2024-02-04 DIAGNOSIS — H0279 Other degenerative disorders of eyelid and periocular area: Secondary | ICD-10-CM | POA: Diagnosis not present

## 2024-02-04 DIAGNOSIS — H53483 Generalized contraction of visual field, bilateral: Secondary | ICD-10-CM | POA: Diagnosis not present

## 2024-02-04 DIAGNOSIS — H02831 Dermatochalasis of right upper eyelid: Secondary | ICD-10-CM | POA: Diagnosis not present

## 2024-02-04 DIAGNOSIS — H02413 Mechanical ptosis of bilateral eyelids: Secondary | ICD-10-CM | POA: Diagnosis not present

## 2024-02-10 ENCOUNTER — Other Ambulatory Visit: Payer: Self-pay

## 2024-02-10 DIAGNOSIS — Z79899 Other long term (current) drug therapy: Secondary | ICD-10-CM

## 2024-02-10 MED ORDER — LOSARTAN POTASSIUM 100 MG PO TABS: 100.0000 mg | ORAL_TABLET | Freq: Every day | ORAL | 3 refills | Status: AC

## 2024-02-19 ENCOUNTER — Other Ambulatory Visit: Payer: Self-pay | Admitting: Cardiovascular Disease

## 2024-02-27 ENCOUNTER — Encounter: Payer: Self-pay | Admitting: Internal Medicine

## 2024-02-27 ENCOUNTER — Ambulatory Visit: Admitting: Internal Medicine

## 2024-02-27 VITALS — BP 144/66 | HR 82 | Temp 98.1°F | Ht 67.0 in | Wt 199.0 lb

## 2024-02-27 DIAGNOSIS — I10 Essential (primary) hypertension: Secondary | ICD-10-CM | POA: Diagnosis not present

## 2024-02-27 DIAGNOSIS — E1069 Type 1 diabetes mellitus with other specified complication: Secondary | ICD-10-CM | POA: Diagnosis not present

## 2024-02-27 DIAGNOSIS — E108 Type 1 diabetes mellitus with unspecified complications: Secondary | ICD-10-CM | POA: Diagnosis not present

## 2024-02-27 DIAGNOSIS — I25119 Atherosclerotic heart disease of native coronary artery with unspecified angina pectoris: Secondary | ICD-10-CM

## 2024-02-27 DIAGNOSIS — N393 Stress incontinence (female) (male): Secondary | ICD-10-CM

## 2024-02-27 DIAGNOSIS — E782 Mixed hyperlipidemia: Secondary | ICD-10-CM

## 2024-02-27 MED ORDER — SOLIFENACIN SUCCINATE 10 MG PO TABS: 10.0000 mg | ORAL_TABLET | Freq: Every day | ORAL | 3 refills | Status: DC

## 2024-02-27 NOTE — Assessment & Plan Note (Signed)
 BP borderline today but normal at home. Continue metoprolol  100 mg daily and losartan  100 mg daily. Recent BMP at goal no change needed.

## 2024-02-27 NOTE — Assessment & Plan Note (Signed)
 Taking zetia  and crestor  and lipids with next endo labs.

## 2024-02-27 NOTE — Assessment & Plan Note (Signed)
 Taking crestor  and zetia  and at goal. Labs due with next draw getting at endo.

## 2024-02-27 NOTE — Progress Notes (Signed)
   Subjective:   Patient ID: [name], female    DOB: May 27, 1946, 78 y.o.   MRN: [id number]  HPI The patient is a 78 YO female coming in for medical management (see A/P for details)   Review of Systems  Constitutional: Negative.   HENT: Negative.    Eyes: Negative.   Respiratory:  Negative for cough, chest tightness and shortness of breath.   Cardiovascular:  Negative for chest pain, palpitations and leg swelling.  Gastrointestinal:  Negative for abdominal distention, abdominal pain, constipation, diarrhea, nausea and vomiting.  Musculoskeletal: Negative.   Skin: Negative.   Neurological: Negative.   Psychiatric/Behavioral: Negative.      Objective:  Physical Exam Constitutional:      Appearance: She is well-developed.  HENT:     Head: Normocephalic and atraumatic.  Cardiovascular:     Rate and Rhythm: Normal rate and regular rhythm.  Pulmonary:     Effort: Pulmonary effort is normal. No respiratory distress.     Breath sounds: Normal breath sounds. No wheezing or rales.  Abdominal:     General: Bowel sounds are normal. There is no distension.     Palpations: Abdomen is soft.     Tenderness: There is no abdominal tenderness. There is no rebound.  Musculoskeletal:     Cervical back: Normal range of motion.  Skin:    General: Skin is warm and dry.  Neurological:     Mental Status: She is alert and oriented to person, place, and time.     Coordination: Coordination normal.     Vitals:   02/27/24 1001 02/27/24 1006  BP: (!) 144/66 (!) 144/66  Pulse: 82   Temp: 98.1 F (36.7 C)   TempSrc: Oral   SpO2: 97%   Weight: 199 lb (90.3 kg)   Height: 5\' 7"  (1.702 m)     Assessment & Plan:

## 2024-02-27 NOTE — Assessment & Plan Note (Signed)
 Refilled vesicare  but she is not sure this is helping much.

## 2024-02-27 NOTE — Assessment & Plan Note (Signed)
 Seeing endo and levels are at goal on insulin  and on statin.

## 2024-03-02 ENCOUNTER — Other Ambulatory Visit: Payer: Self-pay | Admitting: Cardiovascular Disease

## 2024-03-21 ENCOUNTER — Encounter: Payer: Self-pay | Admitting: Cardiovascular Disease

## 2024-03-21 ENCOUNTER — Encounter: Payer: Self-pay | Admitting: Family Medicine

## 2024-03-22 ENCOUNTER — Other Ambulatory Visit: Payer: Self-pay

## 2024-03-22 MED ORDER — GABAPENTIN 100 MG PO CAPS: 200.0000 mg | ORAL_CAPSULE | Freq: Every day | ORAL | 0 refills | Status: DC

## 2024-03-23 DIAGNOSIS — Z79899 Other long term (current) drug therapy: Secondary | ICD-10-CM | POA: Diagnosis not present

## 2024-03-24 LAB — BASIC METABOLIC PANEL WITH GFR
BUN/Creatinine Ratio: 16 (ref 12–28)
BUN: 11 mg/dL (ref 8–27)
CO2: 22 mmol/L (ref 20–29)
Calcium: 9.4 mg/dL (ref 8.7–10.3)
Chloride: 103 mmol/L (ref 96–106)
Creatinine, Ser: 0.69 mg/dL (ref 0.57–1.00)
Glucose: 100 mg/dL — ABNORMAL HIGH (ref 70–99)
Potassium: 4.5 mmol/L (ref 3.5–5.2)
Sodium: 140 mmol/L (ref 134–144)
eGFR: 90 mL/min/{1.73_m2} (ref >59)

## 2024-03-25 ENCOUNTER — Ambulatory Visit: Payer: Self-pay | Admitting: Internal Medicine

## 2024-03-26 ENCOUNTER — Other Ambulatory Visit: Payer: Medicare Other

## 2024-03-26 ENCOUNTER — Other Ambulatory Visit: Payer: Self-pay

## 2024-03-26 DIAGNOSIS — E109 Type 1 diabetes mellitus without complications: Secondary | ICD-10-CM | POA: Diagnosis not present

## 2024-03-26 DIAGNOSIS — E782 Mixed hyperlipidemia: Secondary | ICD-10-CM | POA: Diagnosis not present

## 2024-03-27 LAB — LIPID PANEL
Cholesterol: 88 mg/dL (ref <200)
HDL: 42 mg/dL — ABNORMAL LOW (ref >=50)
LDL Cholesterol (Calc): 32 mg/dL
Non-HDL Cholesterol (Calc): 46 mg/dL (ref <130)
Total CHOL/HDL Ratio: 2.1 (calc) (ref <5.0)
Triglycerides: 59 mg/dL (ref <150)

## 2024-03-27 LAB — BASIC METABOLIC PANEL WITH GFR
BUN: 11 mg/dL (ref 7–25)
CO2: 30 mmol/L (ref 20–32)
Calcium: 9.8 mg/dL (ref 8.6–10.4)
Chloride: 104 mmol/L (ref 98–110)
Creat: 0.66 mg/dL (ref 0.60–1.00)
Glucose, Bld: 105 mg/dL — ABNORMAL HIGH (ref 65–99)
Potassium: 4.8 mmol/L (ref 3.5–5.3)
Sodium: 140 mmol/L (ref 135–146)
eGFR: 91 mL/min/{1.73_m2} (ref >=60)

## 2024-03-27 LAB — HEMOGLOBIN A1C
Hgb A1c MFr Bld: 6.9 % — ABNORMAL HIGH (ref <5.7)
Mean Plasma Glucose: 151 mg/dL
eAG (mmol/L): 8.4 mmol/L

## 2024-03-30 ENCOUNTER — Ambulatory Visit: Payer: Self-pay | Admitting: Internal Medicine

## 2024-03-31 ENCOUNTER — Encounter: Payer: Self-pay | Admitting: Endocrinology

## 2024-03-31 ENCOUNTER — Ambulatory Visit (INDEPENDENT_AMBULATORY_CARE_PROVIDER_SITE_OTHER): Payer: Medicare Other | Admitting: Endocrinology

## 2024-03-31 VITALS — BP 132/60 | HR 75 | Ht 67.0 in | Wt 199.8 lb

## 2024-03-31 DIAGNOSIS — E109 Type 1 diabetes mellitus without complications: Secondary | ICD-10-CM

## 2024-03-31 NOTE — Progress Notes (Signed)
 Outpatient Endocrinology Note Audrey Maxxon Schwanke, MD  03/31/24  Patient's Name: [name]    DOB: [birth year]    MRN: [id number]                                                    REASON OF VISIT: Follow up for type 1 diabetes mellitus  PCP: Adelia Homestead, MD  HISTORY OF PRESENT ILLNESS:   [name] is a 78 y.o. old female with past medical history listed below, is here for follow up of type 1 diabetes mellitus.   Pertinent Diabetes History: Patient was diagnosed with type 1 diabetes mellitus in 1979.  She has been on t:slim tandem insulin  pump.  She has also been on metformin .  She used to be on pioglitazone  in the past which had decreased requirement of insulin  dose.  She has been on t:slim tandem insulin  pump since October 2021.  Chronic Diabetes Complications : Retinopathy: no. Last ophthalmology exam was done on annually, 12/2023. Reviewed office note scAnned in media,  Nephropathy: no, on losartan . Peripheral neuropathy: no Coronary artery disease: no Stroke: no  Relevant comorbidities and cardiovascular risk factors: Obesity: yes Body mass index is 31.29 kg/m.  Hypertension: yes Hyperlipidemia. yes  Current / Home Diabetic regimen includes:  T:slim insulin  pump with Dexcom G7 uses Lyumjev  U100  Insulin  Pump setting:  Basal MN- 0.5u/hour 3:30AM-1.5  5AM- 1.4 6AM- 2.2 10AM- 0.750 11AM- 0.750 1PM-   1.6 3:30PM-1.9 4:30PM-1.8 8PM-     2.0 10PM-   1.8  Bolus CHO Ratio (1unit:CHO) MN- 1:12 3:30AM-1:12 5AM- 1:12 6AM- 1:12 10AM- 1:12 11AM- 1:5 1PM-   1:5 3:30PM-1:5 4:30PM-1:5 8PM-     1:5 10PM-   1:5.5   Correction/Sensitivity: MN- 1:25 3:30AM-1:25  5AM- 1:25 6AM- 1:25 10AM- 1:30 11AM- 1:25 1PM-   1:20 3:30PM-1:20 4:30PM-1:20 8PM-     1:25 10PM-   1:25  Target: 125  Active insulin  time: 5 hours  -Metformin  extended release 500 mg in the morning and 1000 mg in the evening.  Prior diabetic  medications: Pioglitazone .  CONTINUOUS GLUCOSE MONITORING SYSTEM (CGMS) / INSULIN  PUMP INTERPRETATION:    Tandem t:slim pump with Dexcom G7  Date May 1 to Mar 31, 2024  Average total daily dose 56 units, basal 55%, bolus 45%, manual 54 control IQ 46%.  Average carb 68 g. Control IQ 100%.  CGM in use 100%.  GMI 6.7%.                         Impression :  Mostly acceptable blood sugar.  Random hyperglycemia with blood sugar up to 180-200 range send related to high carb meal and one of the time related to site change.  Rare mild hypoglycemia with blood sugar in 60s related to insulin  stacking with auto correctional boluses, meal bolus and manual boluses.  No concerning hypoglycemia and no concerning hyperglycemia.  Hypoglycemia: Patient has minor hypoglycemic episodes. Patient has hypoglycemia awareness.    Factors modifying glucose control: 1.  Diabetic diet assessment: 3 meals a day.  Usually eats outside on Friday evening and weekend  2.  Staying active or exercising: Walking regularly.  3.  Medication compliance: compliant all of the time.  Interval history  Pump and CGM today is repeatable.  Hemoglobin A1c 6.9%.  Mostly acceptable  blood sugar.  No complaints today.  Recent lab results reviewed.  Acceptable cholesterol level.  Normal electrolytes and renal function.  REVIEW OF SYSTEMS As per history of present illness.   PAST MEDICAL HISTORY: Past Medical History:  Diagnosis Date   Arthritis    OA   Atrial flutter (HCC)    a. s/p TEE/DCCV 08/16/13 (normal LV function, no LAA thrombus).b. s/p ablation by Dr Carolynne Citron 09-23-2013   Coronary artery disease    2009 LAD Promus stent   Dysrhythmia    Aflutter, no issues after ablation 2014   Fibroid    Hyperlipidemia    Hypertension    Insulin  pump in place    Osteopenia    Type 1 diabetes mellitus on insulin  therapy (HCC)    Valvular heart disease    a. Mild MR/TR by TEE 08/2013.    PAST SURGICAL HISTORY: Past Surgical  History:  Procedure Laterality Date   ABLATION  09/23/2013   CTI by Dr Carolynne Citron   APPENDECTOMY     APPLICATION OF WOUND VAC Left 04/11/2017   Procedure: APPLICATION OF WOUND VAC LEFT KNEE;  Surgeon: Adonica Hoose, MD;  Location: [facility] OR;  Service: Orthopedics;  Laterality: Left;   ATRIAL FLUTTER ABLATION N/A 09/23/2013   Procedure: ATRIAL FLUTTER ABLATION;  Surgeon: Tammie Fall, MD;  Location: [facility] CATH LAB;  Service: Cardiovascular;  Laterality: N/A;   CARDIOVERSION N/A 08/16/2013   Procedure: CARDIOVERSION;  Surgeon: Lake Pilgrim, MD;  Location[facility] ENDOSCOPY;  Service: Cardiovascular;  Laterality: N/A;  spoke with Tom   CATARACT EXTRACTION Bilateral    COLONOSCOPY     CORONARY ANGIOPLASTY WITH STENT PLACEMENT     EYE SURGERY     Cataract   LASER ABLATION Right 11/20/2023   Right leg laser ablation of the Greater saphenous vein with >20 stab phlebectomies   MYOMECTOMY  1977   ORIF PATELLA Left 04/11/2017   ORIF PATELLA Left 04/11/2017   Procedure: OPEN REDUCTION INTERNAL (ORIF) FIXATION PATELLA LEFT KNEE;  Surgeon: Adonica Hoose, MD;  Location: [facility] OR;  Service: Orthopedics;  Laterality: Left;   PELVIC LAPAROSCOPY     TEE WITHOUT CARDIOVERSION N/A 08/16/2013   Procedure: TRANSESOPHAGEAL ECHOCARDIOGRAM (TEE);  Surgeon: Lake Pilgrim, MD;  Location: [facility] ENDOSCOPY;  Service: Cardiovascular;  Laterality: N/A;   TUBAL LIGATION  1988    ALLERGIES: Allergies  Allergen Reactions   Sulfa Antibiotics Rash   Sulfamethoxazole-Trimethoprim Rash    FAMILY HISTORY:  Family History  Problem Relation Age of Onset   CAD Father 10       died age 36   Early death Father    Heart disease Father    CAD Brother 30       small vessel disease   Early death Brother    Heart disease Brother    Atrial fibrillation Mother    Diabetes Mother    Hypertension Mother    Arthritis Mother    Hearing loss Mother    CAD Cousin        paternal   CAD Paternal Grandfather        early onset    Colon cancer Neg Hx     SOCIAL HISTORY: Social History   Socioeconomic History   Marital status: Married    Spouse name: Genella Kendall   Number of children: 2   Years of education: 14   Highest education level: Bachelor's degree (e.g., BA, AB, BS)  Occupational History   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 adopted  Tobacco Use   Smoking status: Never    Passive exposure: Never   Smokeless tobacco: Never  Vaping Use   Vaping status: Never Used  Substance and Sexual Activity   Alcohol  use: Yes    Alcohol /week: 3.0 - 4.0 standard drinks of alcohol     Types: 3 - 4 Glasses of wine per week    Comment: on weekends   Drug use: Never   Sexual activity: Yes    Birth control/protection: Post-menopausal  Other Topics Concern   Not on file  Social History Narrative   Not on file   Social Drivers of Health   Financial Resource Strain: Low Risk  (01/27/2024)   Overall Financial Resource Strain (CARDIA)    Difficulty of Paying Living Expenses: Not hard at all  Food Insecurity: No Food Insecurity (01/27/2024)   Hunger Vital Sign    Worried About Running Out of Food in the Last Year: Never true    Ran Out of Food in the Last Year: Never true  Transportation Needs: No Transportation Needs (01/27/2024)   PRAPARE - Administrator, Civil Service (Medical): No    Lack of Transportation (Non-Medical): No  Physical Activity: Sufficiently Active (01/27/2024)   Exercise Vital Sign    Days of Exercise per Week: 7 days    Minutes of Exercise per Session: 30 min  Recent Concern: Physical Activity - Insufficiently Active (10/31/2023)   Exercise Vital Sign    Days of Exercise per Week: 3 days    Minutes of Exercise per Session: 30 min  Stress: No Stress Concern Present (01/27/2024)   [name] of Occupational Health - Occupational Stress Questionnaire    Feeling of Stress : Not at all  Social Connections: Socially Integrated (01/27/2024)   Social Connection and Isolation Panel  [NHANES]    Frequency of Communication with Friends and Family: More than three times a week    Frequency of Social Gatherings with Friends and Family: More than three times a week    Attends Religious Services: More than 4 times per year    Active Member of [employer] or Organizations: Yes    Attends Engineer, structural: More than 4 times per year    Marital Status: Married    MEDICATIONS:  Current Outpatient Medications  Medication Sig Dispense Refill   ASPIRIN  81 PO Take by mouth.     Calcium  Carbonate-Vitamin D  (CALCIUM  + D PO) Take 600 mg by mouth 2 (two) times daily.     cholecalciferol  (VITAMIN D ) 1000 units tablet Take 1,000 Units by mouth 2 (two) times daily.      clopidogrel  (PLAVIX ) 75 MG tablet TAKE 1 TABLET DAILY 90 tablet 3   Continuous Blood Gluc Sensor (DEXCOM G6 SENSOR) MISC by Does not apply route.     cyanocobalamin  (VITAMIN B12) 1000 MCG tablet Take 1,000 mcg by mouth daily.     ezetimibe  (ZETIA ) 10 MG tablet Take 1 tablet (10 mg total) by mouth daily. 90 tablet 3   gabapentin  (NEURONTIN ) 100 MG capsule Take 2 capsules (200 mg total) by mouth at bedtime. 180 capsule 0   ibuprofen (ADVIL) 200 MG tablet Take 200 mg by mouth every 6 (six) hours as needed.     losartan  (COZAAR ) 100 MG tablet Take 1 tablet (100 mg total) by mouth daily. 90 tablet 3   LYUMJEV  100 UNIT/ML SOLN USE A MAXIMUM OF 90 UNITS DAILY VIA INSULIN  PUMP 80 mL 3   metFORMIN  (GLUCOPHAGE -XR)  500 MG 24 hr tablet TAKE 1 TABLET IN THE MORNING AND 2 TABLETS IN THE EVENING AS DIRECTED 270 tablet 3   metoprolol  succinate (TOPROL -XL) 100 MG 24 hr tablet TAKE 1 TABLET DAILY 90 tablet 3   Multiple Vitamins-Minerals (PRESERVISION AREDS 2 PO) Take by mouth in the morning and at bedtime.     nitroGLYCERIN  (NITROSTAT ) 0.4 MG SL tablet DISSOLVE 1 TABLET UNDER THE TONGUE EVERY 5 MINUTES AS NEEDED FOR CHEST PAIN 75 tablet 2   Pyridoxine HCl (VITAMIN B6) 100 MG TABS      rosuvastatin  (CRESTOR ) 40 MG tablet Take 1  tablet (40 mg total) by mouth daily. 90 tablet 3   solifenacin  (VESICARE ) 10 MG tablet Take 1 tablet (10 mg total) by mouth daily. 90 tablet 3   TART CHERRY PO Take by mouth.     No current facility-administered medications for this visit.    PHYSICAL EXAM: Vitals:   03/31/24 1001  BP: 132/60  Pulse: 75  Weight: 199 lb 12.8 oz (90.6 kg)  Height: 5\' 7"  (1.702 m)     Body mass index is 31.29 kg/m.  Wt Readings from Last 3 Encounters:  03/31/24 199 lb 12.8 oz (90.6 kg)  02/27/24 199 lb (90.3 kg)  01/27/24 197 lb 12.8 oz (89.7 kg)    General: Well developed, well nourished female in no apparent distress.  HEENT: AT/[place], no external lesions.  Eyes: Conjunctiva clear and no icterus. Neurologic: Alert, oriented, normal speech Psychiatric: Does not appear depressed or anxious  Diabetic Foot Exam - Simple   No data filed    LABS Reviewed Lab Results  Component Value Date   HGBA1C 6.9 (H) 03/26/2024   HGBA1C 6.7 (H) 12/22/2023   HGBA1C 6.6 (A) 09/25/2023   Lab Results  Component Value Date   FRUCTOSAMINE 270 10/06/2019   FRUCTOSAMINE 292 (H) 02/04/2017   FRUCTOSAMINE 278 05/11/2013   Lab Results  Component Value Date   CHOL 88 03/26/2024   HDL 42 (L) 03/26/2024   LDLCALC 32 03/26/2024   LDLDIRECT 79.0 01/11/2015   TRIG 59 03/26/2024   CHOLHDL 2.1 03/26/2024   Lab Results  Component Value Date   MICRALBCREAT 2.2 07/01/2023   MICRALBCREAT 1.2 07/11/2022   Lab Results  Component Value Date   CREATININE 0.66 03/26/2024   Lab Results  Component Value Date   GFR 67.08 04/02/2023    ASSESSMENT / PLAN  1. Type 1 diabetes mellitus without complications (HCC)    Diabetes Mellitus type 1, complicated by no known complications.  - Diabetic status / severity: controlled.   Lab Results  Component Value Date   HGBA1C 6.9 (H) 03/26/2024    - Hemoglobin A1c goal <7%   - Medications:  Tandem t:slim insulin  pump on control IQ using Dexcom G7.uses Lyumjev   U100  Mostly acceptable blood sugar, no change in the pump setting today.  Discussed about avoiding manual correctional boluses , allow for pump for auto correctional boluses in case of hyperglycemia to avoid hypoglycemia due to insulin  stacking.  -Continue metformin  extended release 500 mg in the morning and 1000 mg in the evening.  - Home glucose testing: continue CGM and check blood glucose as needed.  - Discussed/ Gave Hypoglycemia treatment plan.  # Consult : not required at this time.   # Annual urine for microalbuminuria/ creatinine ratio, no microalbuminuria currently, continue ACE/ARB losartan . Last  Lab Results  Component Value Date   MICRALBCREAT 2.2 07/01/2023    # Foot check nightly.  #  Annual dilated diabetic eye exams.   - Diet: Eat reasonable portion sizes to promote a healthy weight - Life style / activity / exercise: Discussed  2. Blood pressure  -  BP Readings from Last 1 Encounters:  03/31/24 132/60    - Control is in target. Blood pressure not measured today.  This is part of visit. - No change in current plans.  3. Lipid status / Hyperlipidemia - Last  Lab Results  Component Value Date   LDLCALC 32 03/26/2024   - Continue rosuvastatin  40 mg daily and Zetia  10 mg daily.  Diagnoses and all orders for this visit:  Type 1 diabetes mellitus without complications (HCC) -     Basic Metabolic Panel Without GFR -     Hemoglobin A1c -     Microalbumin / creatinine urine ratio     DISPOSITION Follow up in clinic in 3 months suggested.   All questions answered and patient verbalized understanding of the plan.  Audrey Lura Falor, MD [facility] Endocrinology [facility] [address] [place], [address] [place], [place] [address] Phone # [phone]  At least part of this note was generated using voice recognition software. Inadvertent word errors may have occurred, which were not recognized during the proofreading process.

## 2024-04-02 ENCOUNTER — Encounter: Payer: Self-pay | Admitting: Cardiovascular Disease

## 2024-04-02 NOTE — Telephone Encounter (Signed)
 Spoke with patient, had an episode of palpitations during the night last night. Lasted around 30 minutes. Denies any SOB or chest pain during episode, states her heart felt like it was just racing. Patient didn't check her rate or pressure during episode, states she feels at her baseline now. Patient doesn't wish to make an appointment at this time (nor a possible monitor), instructed if she has another episode to attempt to get an EKG so the provider can assess. Patient states it has been years since she's had an episode like this but would contact our office back to set up an appt if this happens again. No needs at this time, just wanted it documented in her chart.

## 2024-05-05 ENCOUNTER — Encounter: Payer: Self-pay | Admitting: Family Medicine

## 2024-05-18 ENCOUNTER — Encounter: Payer: Self-pay | Admitting: Cardiovascular Disease

## 2024-05-18 ENCOUNTER — Encounter: Payer: Self-pay | Admitting: Endocrinology

## 2024-05-31 ENCOUNTER — Encounter: Payer: Self-pay | Admitting: Internal Medicine

## 2024-06-01 NOTE — Telephone Encounter (Signed)
**Note De-identified  [place] Obfuscation** Please advise 

## 2024-06-02 ENCOUNTER — Telehealth: Payer: Self-pay | Admitting: Cardiovascular Disease

## 2024-06-02 NOTE — Telephone Encounter (Signed)
 Spoke to patient she stated she is a former patient of Dr.[name].She would like to establish care with Dr.Hochrein.Advised I will send B/P readings to Dr.Hochrein for review and advice.She did not check pulse.

## 2024-06-02 NOTE — Telephone Encounter (Signed)
  Pt c/o BP issue: STAT if pt c/o blurred vision, one-sided weakness or slurred speech.  STAT if BP is GREATER than 180/120 TODAY.  STAT if BP is LESS than 90/60 and SYMPTOMATIC TODAY  1. What is your BP concern? Elevated   2. Have you taken any BP medication today? Yes   3. What are your last 5 BP readings?  07/22 BP 155/76 07/24 BP 152/72 07/25 BP 126/61 07/27 BP 159/78 07/28 BP 147/69 07/29 BP 158/72 07/30 BP 164/83  4. Are you having any other symptoms (ex. Dizziness, headache, blurred vision, passed out)? None   Pt said, her BP been elevated and she is not sure who to send her BP reading since Dr. Alveta retired. She said, when she look at our website she is thinking to transfer her care to Dr. Lavona or Dr. Acharya

## 2024-06-03 NOTE — Telephone Encounter (Addendum)
 Spoke to patient Dr.Hochrein's advice given.She will send B/P readings to mychart in 2 weeks.Appointment scheduled with Dr.Hochrein 9/4 at 9:20 am.

## 2024-06-04 ENCOUNTER — Other Ambulatory Visit (HOSPITAL_BASED_OUTPATIENT_CLINIC_OR_DEPARTMENT_OTHER): Payer: Self-pay

## 2024-06-08 DIAGNOSIS — Z1231 Encounter for screening mammogram for malignant neoplasm of breast: Secondary | ICD-10-CM | POA: Diagnosis not present

## 2024-06-08 DIAGNOSIS — H903 Sensorineural hearing loss, bilateral: Secondary | ICD-10-CM | POA: Diagnosis not present

## 2024-06-08 LAB — HM MAMMOGRAPHY

## 2024-06-10 ENCOUNTER — Other Ambulatory Visit: Payer: Self-pay

## 2024-06-10 ENCOUNTER — Encounter: Payer: Self-pay | Admitting: Internal Medicine

## 2024-06-10 DIAGNOSIS — E109 Type 1 diabetes mellitus without complications: Secondary | ICD-10-CM

## 2024-06-10 MED ORDER — LYUMJEV 100 UNIT/ML IJ SOLN: INTRAMUSCULAR | 3 refills | Status: AC

## 2024-06-11 ENCOUNTER — Telehealth: Payer: Self-pay

## 2024-06-11 NOTE — Telephone Encounter (Signed)
   Pre-operative Risk Assessment    Patient Name: [name]  DOB: 24-Sep-1946 MRN: [id number]   Date of last office visit: 01/16/24 [name], MD Date of next office visit: 07/08/24 [name], MD   Request for Surgical Clearance    Procedure:  BILATERAL UPPER EYELID BLEPHAROPLASTY CC TRICHOPHYTIC FOREHEAD RHYTIDECTOMY  Date of Surgery:  Clearance 07/27/24                                Surgeon:  DR OSBORNE PONDER Surgeon's Group or Practice Name:  [facility] Phone number:  [phone] Fax number:  [phone]   Type of Clearance Requested:   - Medical  - Pharmacy:  Hold Aspirin  and Clopidogrel  (Plavix )     Type of Anesthesia:  Not Indicated   Additional requests/questions:    Signed, [name]   06/11/2024, 10:11 AM

## 2024-06-11 NOTE — Telephone Encounter (Signed)
 Dr. Alveta,  Ms. Rau is requesting preoperative clearance for a bilateral upper eyelid surgery on 07/27/2024.  She last saw you on January 16, 2024, has a history of atrial flutter, DM, CAD, HLD, stent placement in LAD in 2009.  She is on aspirin  and Plavix , what are your recommendations for holding those prior to her procedure?   Please route a response to the CV DIV PREOP POOL.  Thank you for your time,  Miriam Shams, FNP-C    06/11/2024, 11:45 AM [facility] [address] Office [phone] Fax [phone]

## 2024-06-15 DIAGNOSIS — R928 Other abnormal and inconclusive findings on diagnostic imaging of breast: Secondary | ICD-10-CM | POA: Diagnosis not present

## 2024-06-15 DIAGNOSIS — N6325 Unspecified lump in the left breast, overlapping quadrants: Secondary | ICD-10-CM | POA: Diagnosis not present

## 2024-06-15 DIAGNOSIS — R921 Mammographic calcification found on diagnostic imaging of breast: Secondary | ICD-10-CM | POA: Diagnosis not present

## 2024-06-16 ENCOUNTER — Telehealth: Payer: Self-pay

## 2024-06-16 NOTE — Telephone Encounter (Signed)
 Forms for [facility] have been fax back on and sent successfully on 06/15/2024

## 2024-06-18 NOTE — Telephone Encounter (Signed)
 If antiplatelets need to be held prior to upper eyelid surgery hold aspirin  and Plavix  for 7 days or as recommended by her ophthalmologist.  [name] when medically safe/appropriate hemostasis is achieved.  Dr. Jamy Whyte

## 2024-06-18 NOTE — Telephone Encounter (Signed)
 Dr. Michele,  This is a patient of Dr. Alveta.  He was sent a pre-op clearance on his patient. Since he retired I was advised to send to DOD.  Today you are listed. Would you please comment? Here is the request below:  [name] is requesting preoperative clearance for a bilateral upper eyelid surgery on 07/27/2024.  She last saw you on January 16, 2024, has a history of atrial flutter, DM, CAD, HLD, stent placement in LAD in 2009.  She is on aspirin  and Plavix , what are your recommendations for holding those prior to her procedure?     Thank you, Lamarr Satterfield DNP, ANP, AACC.

## 2024-06-21 NOTE — Telephone Encounter (Signed)
 Spoke with harlene and verified both forms first that were needed and they both have been sent back in as of this morning. Confirmation was ok

## 2024-06-21 NOTE — Telephone Encounter (Unsigned)
 Copied from CRM #[id number]. Topic: General - Other >> Jun 21, 2024  9:18 AM Revonda D wrote: Reason for CRM: Harlene with [facility] stated that she faxed over some documents on 8/12 for the pt and is checking to verify of the office received it. Harlene stated that the documents need to be signed and faxed back today due to the pt having an appt scheduled for tomorrow with them. CB [phone].

## 2024-06-22 ENCOUNTER — Other Ambulatory Visit: Payer: Self-pay | Admitting: Radiology

## 2024-06-22 DIAGNOSIS — N6325 Unspecified lump in the left breast, overlapping quadrants: Secondary | ICD-10-CM | POA: Diagnosis not present

## 2024-06-22 DIAGNOSIS — C50812 Malignant neoplasm of overlapping sites of left female breast: Secondary | ICD-10-CM | POA: Diagnosis not present

## 2024-06-22 DIAGNOSIS — R921 Mammographic calcification found on diagnostic imaging of breast: Secondary | ICD-10-CM | POA: Diagnosis not present

## 2024-06-22 DIAGNOSIS — Z170 Estrogen receptor positive status [ER+]: Secondary | ICD-10-CM | POA: Diagnosis not present

## 2024-06-22 DIAGNOSIS — N6321 Unspecified lump in the left breast, upper outer quadrant: Secondary | ICD-10-CM | POA: Diagnosis not present

## 2024-06-23 LAB — SURGICAL PATHOLOGY

## 2024-06-23 NOTE — Telephone Encounter (Signed)
   Name: [name]  DOB: 1946/07/01  MRN: [id number]  Primary Cardiologist: [name], MD (Inactive)   Preoperative team, please contact this patient and set up a phone call appointment for further preoperative risk assessment. Please obtain consent and complete medication review. Thank you for your help.  I confirm that guidance regarding antiplatelet and oral anticoagulation therapy has been completed and, if necessary, noted below.  Per Dr.Tolia: If antiplatelets need to be held prior to upper eyelid surgery hold aspirin  and Plavix  for 7 days or as recommended by her ophthalmologist. [name] when medically safe/appropriate hemostasis is achieved.   I also confirmed the patient resides in the state of [place] . As per [facility] Medical Board telemedicine laws, the patient must reside in the state in which the provider is licensed.   Lamarr Satterfield, NP 06/23/2024, 7:40 AM [facility]

## 2024-06-23 NOTE — Telephone Encounter (Signed)
 Will forward to preop APP to review that the pt has an appt in office 07/08/24 Dr. Lavona. Procedure is 07/27/24. Will confirm if the pt still needs tele appt as indicated.

## 2024-06-23 NOTE — Telephone Encounter (Signed)
   Name: [name]  DOB: 10/16/1946  MRN: [id number]  Primary Cardiologist: [name], MD (Inactive)  Chart reviewed as part of pre-operative protocol coverage. The patient has an upcoming visit scheduled with Dr. Lavona on 07/08/2024 at which time clearance can be addressed in case there are any issues that would impact surgical recommendations.  BILATERAL UPPER EYELID BLEPHAROPLASTY CC TRICHOPHYTIC FOREHEAD RHYTIDECTOMY Is not scheduled until 07/27/2024 as below. I added preop FYI to appointment note so that provider is aware to address at time of outpatient visit.  Per office protocol the cardiology provider should forward their finalized clearance decision and recommendations regarding antiplatelet therapy to the requesting party below.    This message will also be routed to pharmacy pool and/or Dr Lavona  for input on holding Plavix  as requested below so that this information is available to the clearing provider at time of patient's appointment.   I will route this message as FYI to requesting party and remove this message from the preop box as separate preop APP input not needed at this time.   Please call with any questions.  Lamarr Satterfield, NP  06/23/2024, 10:35 AM

## 2024-06-24 ENCOUNTER — Encounter: Payer: Self-pay | Admitting: Internal Medicine

## 2024-06-24 ENCOUNTER — Telehealth: Payer: Self-pay | Admitting: *Deleted

## 2024-06-24 NOTE — Telephone Encounter (Signed)
 LVM for patient to return call in reference to [facility] on 8/27 arrival time of 1215, left my call back number

## 2024-06-28 ENCOUNTER — Encounter: Payer: Self-pay | Admitting: *Deleted

## 2024-06-28 DIAGNOSIS — H903 Sensorineural hearing loss, bilateral: Secondary | ICD-10-CM | POA: Diagnosis not present

## 2024-06-28 DIAGNOSIS — Z170 Estrogen receptor positive status [ER+]: Secondary | ICD-10-CM | POA: Insufficient documentation

## 2024-06-30 ENCOUNTER — Inpatient Hospital Stay (HOSPITAL_BASED_OUTPATIENT_CLINIC_OR_DEPARTMENT_OTHER): Admitting: Hematology

## 2024-06-30 ENCOUNTER — Encounter: Payer: Self-pay | Admitting: Physical Therapy

## 2024-06-30 ENCOUNTER — Ambulatory Visit
Admission: RE | Admit: 2024-06-30 | Discharge: 2024-06-30 | Disposition: A | Discharge status: Home / Self Care | Source: Ambulatory Visit | Attending: Radiation Oncology | Admitting: Radiation Oncology

## 2024-06-30 ENCOUNTER — Ambulatory Visit: Attending: Surgery | Admitting: Physical Therapy

## 2024-06-30 ENCOUNTER — Telehealth: Payer: Self-pay | Admitting: Genetic Counselor

## 2024-06-30 ENCOUNTER — Encounter: Payer: Self-pay | Admitting: *Deleted

## 2024-06-30 ENCOUNTER — Inpatient Hospital Stay: Admitting: Genetic Counselor

## 2024-06-30 ENCOUNTER — Other Ambulatory Visit: Payer: Self-pay

## 2024-06-30 ENCOUNTER — Other Ambulatory Visit: Payer: Self-pay | Admitting: Surgery

## 2024-06-30 ENCOUNTER — Encounter: Payer: Self-pay | Admitting: Hematology

## 2024-06-30 ENCOUNTER — Inpatient Hospital Stay: Attending: Hematology

## 2024-06-30 VITALS — BP 146/64 | HR 77 | Temp 97.7°F | Resp 19 | Ht 67.0 in | Wt 199.1 lb

## 2024-06-30 DIAGNOSIS — Z1721 Progesterone receptor positive status: Secondary | ICD-10-CM | POA: Insufficient documentation

## 2024-06-30 DIAGNOSIS — Z1732 Human epidermal growth factor receptor 2 negative status: Secondary | ICD-10-CM | POA: Diagnosis not present

## 2024-06-30 DIAGNOSIS — E109 Type 1 diabetes mellitus without complications: Secondary | ICD-10-CM | POA: Insufficient documentation

## 2024-06-30 DIAGNOSIS — I2510 Atherosclerotic heart disease of native coronary artery without angina pectoris: Secondary | ICD-10-CM | POA: Diagnosis not present

## 2024-06-30 DIAGNOSIS — I4892 Unspecified atrial flutter: Secondary | ICD-10-CM | POA: Diagnosis not present

## 2024-06-30 DIAGNOSIS — Z170 Estrogen receptor positive status [ER+]: Secondary | ICD-10-CM

## 2024-06-30 DIAGNOSIS — Z9889 Other specified postprocedural states: Secondary | ICD-10-CM | POA: Diagnosis not present

## 2024-06-30 DIAGNOSIS — I10 Essential (primary) hypertension: Secondary | ICD-10-CM | POA: Diagnosis not present

## 2024-06-30 DIAGNOSIS — Z794 Long term (current) use of insulin: Secondary | ICD-10-CM | POA: Diagnosis not present

## 2024-06-30 DIAGNOSIS — Z853 Personal history of malignant neoplasm of breast: Secondary | ICD-10-CM

## 2024-06-30 DIAGNOSIS — R293 Abnormal posture: Secondary | ICD-10-CM | POA: Diagnosis not present

## 2024-06-30 DIAGNOSIS — M1990 Unspecified osteoarthritis, unspecified site: Secondary | ICD-10-CM | POA: Insufficient documentation

## 2024-06-30 DIAGNOSIS — C50412 Malignant neoplasm of upper-outer quadrant of left female breast: Secondary | ICD-10-CM | POA: Insufficient documentation

## 2024-06-30 DIAGNOSIS — C50912 Malignant neoplasm of unspecified site of left female breast: Secondary | ICD-10-CM | POA: Diagnosis not present

## 2024-06-30 LAB — CBC WITH DIFFERENTIAL (CANCER CENTER ONLY)
Abs Immature Granulocytes: 0.01 K/uL (ref 0.00–0.07)
Basophils Absolute: 0.1 K/uL (ref 0.0–0.1)
Basophils Relative: 1 %
Eosinophils Absolute: 0.2 K/uL (ref 0.0–0.5)
Eosinophils Relative: 3 %
HCT: 47 % — ABNORMAL HIGH (ref 36.0–46.0)
Hemoglobin: 15.8 g/dL — ABNORMAL HIGH (ref 12.0–15.0)
Immature Granulocytes: 0 %
Lymphocytes Relative: 20 %
Lymphs Abs: 1.3 K/uL (ref 0.7–4.0)
MCH: 29.9 pg (ref 26.0–34.0)
MCHC: 33.6 g/dL (ref 30.0–36.0)
MCV: 89 fL (ref 80.0–100.0)
Monocytes Absolute: 0.8 K/uL (ref 0.1–1.0)
Monocytes Relative: 12 %
Neutro Abs: 4 K/uL (ref 1.7–7.7)
Neutrophils Relative %: 64 %
Platelet Count: 331 K/uL (ref 150–400)
RBC: 5.28 MIL/uL — ABNORMAL HIGH (ref 3.87–5.11)
RDW: 12.7 % (ref 11.5–15.5)
WBC Count: 6.2 K/uL (ref 4.0–10.5)
nRBC: 0 % (ref 0.0–0.2)

## 2024-06-30 LAB — CMP (CANCER CENTER ONLY)
ALT: 52 U/L — ABNORMAL HIGH (ref 0–44)
AST: 40 U/L (ref 15–41)
Albumin: 4.4 g/dL (ref 3.5–5.0)
Alkaline Phosphatase: 106 U/L (ref 38–126)
Anion gap: 12 (ref 5–15)
BUN: 17 mg/dL (ref 8–23)
CO2: 26 mmol/L (ref 22–32)
Calcium: 9.9 mg/dL (ref 8.9–10.3)
Chloride: 102 mmol/L (ref 98–111)
Creatinine: 0.83 mg/dL (ref 0.44–1.00)
GFR, Estimated: >60 mL/min (ref >60)
Glucose, Bld: 90 mg/dL (ref 70–99)
Potassium: 4.2 mmol/L (ref 3.5–5.1)
Sodium: 140 mmol/L (ref 135–145)
Total Bilirubin: 0.5 mg/dL (ref 0.0–1.2)
Total Protein: 7.3 g/dL (ref 6.5–8.1)

## 2024-06-30 LAB — GENETIC SCREENING ORDER

## 2024-06-30 NOTE — Telephone Encounter (Signed)
 [name] was seen by a genetic counselor during the breast multidisciplinary clinic on June 30, 2024. In addition to her personal history of breast cancer, she reported a personal history of breast cancer and a family history of cardiovascular disease. She does not meet NCCN criteria for genetic testing at this time.   She was still offered genetic counseling and testing but declined. We encourage her to contact us  if there are any changes to her personal or family history of cancer. If she meets NCCN criteria based on the updated personal/family history, she would be recommended to have genetic counseling and testing.        Darice Monte, MS, CGC  Licensed, Patent attorney [email or web address] phone: [phone]

## 2024-06-30 NOTE — Research (Signed)
 Trial:  [employer] 2021-05 - Specimen Collection Study to Evaluate Biomarkers in Subjects with Cancer  Patient [name] was identified by Dr Lanny as a potential candidate for the above listed study.  This Clinical Research Nurse met with [name], [id number], on 06/30/24 in a manner and location that ensures patient privacy to discuss participation in the above listed research study.  Patient is Accompanied by her husband.  A copy of the informed consent document with embedded HIPAA language was provided to the patient.  Patient reads, speaks, and understands [place].   Patient was provided with the business card of this Nurse and encouraged to contact the research team with any questions.  Approximately 10 minutes were spent with the patient reviewing the informed consent documents.  Patient was provided the option of taking informed consent documents home to review and was encouraged to review at their convenience with their support network, including other care providers. Patient took the consent documents home to review. Andrea MORTON [name], RN, BSN, [facility]  Her  Hers Clinical Research Nurse [facility] [place] Direct Dial [phone] 06/30/2024 4:44 PM

## 2024-06-30 NOTE — Progress Notes (Signed)
 Radiation Oncology         [phone] ________________________________  Name: [name]        MRN: [id number]  Date of Service: 06/30/2024 DOB: 08/11/1946  RR:[id number], Almarie LABOR, MD  Vernetta Berg, MD     REFERRING PHYSICIAN: Vernetta Berg, MD   DIAGNOSIS: The encounter diagnosis was Malignant neoplasm of upper-outer quadrant of left breast in female, estrogen receptor positive (HCC).   HISTORY OF PRESENT ILLNESS: [name] is a 78 y.o. female seen in the multidisciplinary breast clinic for a new diagnosis of left breast cancer. The patient presented with focal asymmetry in the left breast on screening mammogram.  Further workup included diagnostic imaging and additional mammography showed a 2.2 cm area of asymmetry in the upper outer quadrant of the left breast a contrast-enhanced mammogram showed enhancement measuring a total of 3.5 cm and by ultrasound the area was located in the 1 o'clock position measuring 2.2 cm and her axilla was negative for adenopathy.  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  Given her diagnosis she is seen to discuss treatment recommendations.    PREVIOUS RADIATION THERAPY: No   PAST MEDICAL HISTORY:  Past Medical History:  Diagnosis Date   Arthritis    OA   Atrial flutter (HCC)    a. s/p TEE/DCCV 08/16/13 (normal LV function, no LAA thrombus).b. s/p ablation by Dr Waddell 09-23-2013   Coronary artery disease    2009 LAD Promus stent   Dysrhythmia    Aflutter, no issues after ablation 2014   Fibroid    Hyperlipidemia    Hypertension    Insulin  pump in place    Osteopenia    Type 1 diabetes mellitus on insulin  therapy (HCC)    Valvular heart disease    a. Mild MR/TR by TEE 08/2013.       PAST SURGICAL HISTORY: Past Surgical History:  Procedure Laterality Date   ABLATION  09/23/2013   CTI by Dr Waddell   APPENDECTOMY     APPLICATION OF WOUND VAC Left  04/11/2017   Procedure: APPLICATION OF WOUND VAC LEFT KNEE;  Surgeon: Fidel Rogue, MD;  Location: [facility] OR;  Service: Orthopedics;  Laterality: Left;   ATRIAL FLUTTER ABLATION N/A 09/23/2013   Procedure: ATRIAL FLUTTER ABLATION;  Surgeon: Danelle LELON Waddell, MD;  Location: [facility] CATH LAB;  Service: Cardiovascular;  Laterality: N/A;   CARDIOVERSION N/A 08/16/2013   Procedure: CARDIOVERSION;  Surgeon: Aleene JINNY Passe, MD;  Location: [facility] ENDOSCOPY;  Service: Cardiovascular;  Laterality: N/A;  spoke with Tom   CATARACT EXTRACTION Bilateral    COLONOSCOPY     CORONARY ANGIOPLASTY WITH STENT PLACEMENT     EYE SURGERY     Cataract   LASER ABLATION Right 11/20/2023   Right leg laser ablation of the Greater saphenous vein with >20 stab phlebectomies   MYOMECTOMY  1977   ORIF PATELLA Left 04/11/2017   ORIF PATELLA Left 04/11/2017   Procedure: OPEN REDUCTION INTERNAL (ORIF) FIXATION PATELLA LEFT KNEE;  Surgeon: Fidel Rogue, MD;  Location: [facility] OR;  Service: Orthopedics;  Laterality: Left;   PELVIC LAPAROSCOPY     TEE WITHOUT CARDIOVERSION N/A 08/16/2013   Procedure: TRANSESOPHAGEAL ECHOCARDIOGRAM (TEE);  Surgeon: Aleene JINNY Passe, MD;  Location: [facility] ENDOSCOPY;  Service: Cardiovascular;  Laterality: N/A;   TUBAL LIGATION  1988     FAMILY HISTORY:  Family History  Problem Relation Age of Onset  CAD Father 86       died age 37   Early death Father    Heart disease Father    CAD Brother 30       small vessel disease   Early death Brother    Heart disease Brother    Atrial fibrillation Mother    Diabetes Mother    Hypertension Mother    Arthritis Mother    Hearing loss Mother    CAD Cousin        paternal   CAD Paternal Grandfather        early onset   Colon cancer Neg Hx      SOCIAL HISTORY:  reports that she has never smoked. She has never been exposed to tobacco smoke. She has never used smokeless tobacco. She reports current alcohol  use of about 3.0 - 4.0 standard drinks of alcohol  per  week. She reports that she does not use drugs.  The patient is married and lives in [place]. She is a retired Biomedical engineer. She's accompanied by her husband.    ALLERGIES: Sulfa antibiotics and Sulfamethoxazole-trimethoprim   MEDICATIONS:  Current Outpatient Medications  Medication Sig Dispense Refill   ASPIRIN  81 PO Take by mouth.     Calcium  Carbonate-Vitamin D  (CALCIUM  + D PO) Take 600 mg by mouth 2 (two) times daily.     cholecalciferol  (VITAMIN D ) 1000 units tablet Take 1,000 Units by mouth 2 (two) times daily.      clopidogrel  (PLAVIX ) 75 MG tablet TAKE 1 TABLET DAILY 90 tablet 3   Continuous Blood Gluc Sensor (DEXCOM G6 SENSOR) MISC by Does not apply route.     cyanocobalamin  (VITAMIN B12) 1000 MCG tablet Take 1,000 mcg by mouth daily.     ezetimibe  (ZETIA ) 10 MG tablet Take 1 tablet (10 mg total) by mouth daily. 90 tablet 3   gabapentin  (NEURONTIN ) 100 MG capsule Take 2 capsules (200 mg total) by mouth at bedtime. 180 capsule 0   ibuprofen (ADVIL) 200 MG tablet Take 200 mg by mouth every 6 (six) hours as needed.     Insulin  Lispro-aabc (LYUMJEV ) 100 UNIT/ML SOLN USE A MAXIMUM OF 90 UNITS DAILY VIA INSULIN  PUMP 80 mL 3   losartan  (COZAAR ) 100 MG tablet Take 1 tablet (100 mg total) by mouth daily. 90 tablet 3   metFORMIN  (GLUCOPHAGE -XR) 500 MG 24 hr tablet TAKE 1 TABLET IN THE MORNING AND 2 TABLETS IN THE EVENING AS DIRECTED 270 tablet 3   metoprolol  succinate (TOPROL -XL) 100 MG 24 hr tablet TAKE 1 TABLET DAILY 90 tablet 3   Multiple Vitamins-Minerals (PRESERVISION AREDS 2 PO) Take by mouth in the morning and at bedtime.     nitroGLYCERIN  (NITROSTAT ) 0.4 MG SL tablet DISSOLVE 1 TABLET UNDER THE TONGUE EVERY 5 MINUTES AS NEEDED FOR CHEST PAIN 75 tablet 2   Pyridoxine HCl (VITAMIN B6) 100 MG TABS      rosuvastatin  (CRESTOR ) 40 MG tablet Take 1 tablet (40 mg total) by mouth daily. 90 tablet 3   solifenacin  (VESICARE ) 10 MG tablet Take 1 tablet (10 mg total) by mouth  daily. 90 tablet 3   TART CHERRY PO Take by mouth.     No current facility-administered medications for this visit.     REVIEW OF SYSTEMS: On review of systems, the patient reports that she is doing well overall and is overwhelmed by the information.      PHYSICAL EXAM:  Wt Readings from Last 3 Encounters:  03/31/24 199 lb  12.8 oz (90.6 kg)  02/27/24 199 lb (90.3 kg)  01/27/24 197 lb 12.8 oz (89.7 kg)   Temp Readings from Last 3 Encounters:  02/27/24 98.1 F (36.7 C) (Oral)  12/03/23 98.1 F (36.7 C)  11/20/23 98.6 F (37 C) (Oral)   BP Readings from Last 3 Encounters:  03/31/24 132/60  02/27/24 (!) 144/66  01/27/24 (!) 142/68   Pulse Readings from Last 3 Encounters:  03/31/24 75  02/27/24 82  01/27/24 69    In general this is a well appearing Caucasian female in no acute distress. She's alert and oriented x4 and appropriate throughout the examination. Cardiopulmonary assessment is negative for acute distress and she exhibits normal effort. Bilateral breast exam is deferred.    ECOG = 0  0 - Asymptomatic (Fully active, able to carry on all predisease activities without restriction)  1 - Symptomatic but completely ambulatory (Restricted in physically strenuous activity but ambulatory and able to carry out work of a light or sedentary nature. For example, light housework, office work)  2 - Symptomatic, <50% in bed during the day (Ambulatory and capable of all self care but unable to carry out any work activities. Up and about more than 50% of waking hours)  3 - Symptomatic, >50% in bed, but not bedbound (Capable of only limited self-care, confined to bed or chair 50% or more of waking hours)  4 - Bedbound (Completely disabled. Cannot carry on any self-care. Totally confined to bed or chair)  5 - Death   Raylene MM, Creech RH, Tormey DC, et al. [phone]). Toxicity and response criteria of the [facility]. Am. DOROTHA Bridges. Oncol. 5 (6):  649-55    LABORATORY DATA:  Lab Results  Component Value Date   WBC 6.5 11/22/2019   HGB 14.4 11/22/2019   HCT 43.9 11/22/2019   MCV 94 11/22/2019   PLT 246 11/22/2019   Lab Results  Component Value Date   NA 140 03/26/2024   K 4.8 03/26/2024   CL 104 03/26/2024   CO2 30 03/26/2024   Lab Results  Component Value Date   ALT 31 04/18/2023   AST 26 04/02/2023   ALKPHOS 105 04/02/2023   BILITOT 0.3 04/02/2023      RADIOGRAPHY: No results found.     IMPRESSION/PLAN: 1. 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 Dr. Dewey discusses the pathology findings and reviews the nature of triple positive left breast disease. The consensus from the breast conference includes breast conservation with lumpectomy with  sentinel node biopsy. Dr. Lanny anticipes adjuvant chemotherapy with continued anti-HER2 therapy.  Dr. Dewey discusses the rationale for external radiotherapy to the breast  to reduce risks of local recurrence. Dr. Lanny anticipates adjuvant antiestrogen therapy to follow. We discussed the risks, benefits, short, and long term effects of radiotherapy, as well as the curative intent, and the patient is interested in proceeding. Dr. Dewey discusses the delivery and logistics of radiotherapy and anticipates a course of 4 or up to 6 1/2 weeks of radiotherapy.  We will plan to see her back approximately 4 weeks after her adjuvant chemotherapy is completed to make further discussion.  2. Possible genetic predisposition to malignancy. The patient is a candidate for genetic testing given her personal history. She will meet with our geneticist today in clinic.   In a visit lasting 60 minutes, greater than 50% of the time was spent face to face reviewing her case, as well as  in preparation of, discussing, and coordinating the patient's care.  The above documentation reflects my direct findings during this shared patient visit. Please see the  separate note by Dr. Dewey on this date for the remainder of the patient's plan of care.    Donald KYM Husband, [facility]    **Disclaimer: This note was dictated with voice recognition software. Similar sounding words can inadvertently be transcribed and this note may contain transcription errors which may not have been corrected upon publication of note.**

## 2024-06-30 NOTE — Progress Notes (Signed)
 [facility]   [phone] [phone]   Clinic New Consult Note   Patient Care Team: Rollene Almarie LABOR, MD as PCP - General (Internal Medicine) [name], Aleene PARAS, MD (Inactive) as PCP - Cardiology (Cardiology) [name], Aleene PARAS, MD (Inactive) (Cardiology) Dwane Lerner, MD (Dermatology) Waddell Danelle ORN, MD (Cardiology) Obie Princella HERO, MD (Inactive) (Gastroenterology) Claudene Arthea HERO, DO (Sports Medicine) [place], Heather, OD as Consulting Physician (Optometry) Szabat, Toribio BROCKS, [facility] (Inactive) (Pharmacist) Magdalen Pasco RAMAN, DPM as Consulting Physician (Podiatry) Th[place], [place], MD as Consulting Physician (Endocrinology) Gerome, Devere HERO, RN as Oncology Nurse Navigator Tyree Nanetta SAILOR, RN as Oncology Nurse Navigator Vernetta Berg, MD as Consulting Physician (General Surgery) Lanny Callander, MD as Consulting Physician (Hematology) Dewey Rush, MD as Consulting Physician (Radiation Oncology) 06/30/2024  CHIEF COMPLAINTS/PURPOSE OF CONSULTATION:  Newly diagnosed left breast cancer  REFERRING PHYSICIAN: [facility]  Discussed the use of AI scribe software for clinical note transcription with the patient, who gave verbal consent to proceed.  History of Present Illness [name] is a 78 year old female with newly diagnosed left breast cancer who presents for a new oncology consultation. She is accompanied by her husband. She was referred by [facility] for oncology evaluation.  A routine mammogram in July 2024 revealed a 2.2 cm asymmetry in the left breast. in the contrast induced mammogram, this area measuring 3.5 cm in the upper outer quadrant of the left breast, x-ray was negative on ultrasound.  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  Ki-67 3%. She has not experienced any breast pain or discomfort.  Her medical history includes  atrial flutter treated with ablation, stent placement, type 1 diabetes managed with insulin , and hypertension treated with metoprolol , losartan , and metformin . She has arthritis in her thumbs and back, for which she occasionally takes Advil. She is in the early stages of macular degeneration and takes a multivitamin, calcium , vitamin B12, and Zetia . Gabapentin  is used for nerve pain related to arthritis.  Socially, she drinks wine primarily on weekends and has never smoked. She is retired from Agricultural consultant and enjoys walking her dog daily.     MEDICAL HISTORY:  Past Medical History:  Diagnosis Date   Arthritis    OA   Atrial flutter (HCC)    a. s/p TEE/DCCV 08/16/13 (normal LV function, no LAA thrombus).b. s/p ablation by Dr Waddell 09-23-2013   Coronary artery disease    2009 LAD Promus stent   Dysrhythmia    Aflutter, no issues after ablation 2014   Fibroid    Hyperlipidemia    Hypertension    Insulin  pump in place    Osteopenia    Type 1 diabetes mellitus on insulin  therapy (HCC)    Valvular heart disease    a. Mild MR/TR by TEE 08/2013.    SURGICAL HISTORY: Past Surgical History:  Procedure Laterality Date   ABLATION  09/23/2013   CTI by Dr Waddell   APPENDECTOMY     APPLICATION OF WOUND VAC Left 04/11/2017   Procedure: APPLICATION OF WOUND VAC LEFT KNEE;  Surgeon: Fidel Rogue, MD;  Location: [facility] OR;  Service: Orthopedics;  Laterality: Left;   ATRIAL FLUTTER ABLATION N/A 09/23/2013   Procedure: ATRIAL FLUTTER ABLATION;  Surgeon: Danelle ORN Waddell, MD;  Location: [facility] CATH LAB;  Service: Cardiovascular;  Laterality: N/A;   CARDIOVERSION N/A 08/16/2013   Procedure: CARDIOVERSION;  Surgeon:  Aleene JINNY Passe, MD;  Location: [facility] ENDOSCOPY;  Service: Cardiovascular;  Laterality: N/A;  spoke with Tom   CATARACT EXTRACTION Bilateral    COLONOSCOPY     CORONARY ANGIOPLASTY WITH STENT PLACEMENT     EYE SURGERY     Cataract   LASER ABLATION Right 11/20/2023   Right leg laser ablation of  the Greater saphenous vein with >20 stab phlebectomies   MYOMECTOMY  1977   ORIF PATELLA Left 04/11/2017   ORIF PATELLA Left 04/11/2017   Procedure: OPEN REDUCTION INTERNAL (ORIF) FIXATION PATELLA LEFT KNEE;  Surgeon: Fidel Rogue, MD;  Location: [facility] OR;  Service: Orthopedics;  Laterality: Left;   PELVIC LAPAROSCOPY     TEE WITHOUT CARDIOVERSION N/A 08/16/2013   Procedure: TRANSESOPHAGEAL ECHOCARDIOGRAM (TEE);  Surgeon: Aleene JINNY Passe, MD;  Location: [facility] ENDOSCOPY;  Service: Cardiovascular;  Laterality: N/A;   TUBAL LIGATION  1988    SOCIAL HISTORY: Social History   Socioeconomic History   Marital status: Married    Spouse name: [name]   Number of children: 0   Years of education: 14   Highest education level: Bachelor's degree (e.g., BA, AB, BS)  Occupational History   Occupation: home maker    Comment: Children adopted  Tobacco Use   Smoking status: Never    Passive exposure: Never   Smokeless tobacco: Never  Vaping Use   Vaping status: Never Used  Substance and Sexual Activity   Alcohol  use: Yes    Alcohol /week: 3.0 - 4.0 standard drinks of alcohol     Types: 3 - 4 Glasses of wine per week    Comment: on weekends   Drug use: Never   Sexual activity: Yes    Birth control/protection: Post-menopausal  Other Topics Concern   Not on file  Social History Narrative   Not on file   Social Drivers of Health   Financial Resource Strain: Low Risk  (01/27/2024)   Overall Financial Resource Strain (CARDIA)    Difficulty of Paying Living Expenses: Not hard at all  Food Insecurity: No Food Insecurity (06/30/2024)   Hunger Vital Sign    Worried About Running Out of Food in the Last Year: Never true    Ran Out of Food in the Last Year: Never true  Transportation Needs: No Transportation Needs (06/30/2024)   PRAPARE - Administrator, Civil Service (Medical): No    Lack of Transportation (Non-Medical): No  Physical Activity: Sufficiently Active (01/27/2024)   Exercise  Vital Sign    Days of Exercise per Week: 7 days    Minutes of Exercise per Session: 30 min  Recent Concern: Physical Activity - Insufficiently Active (10/31/2023)   Exercise Vital Sign    Days of Exercise per Week: 3 days    Minutes of Exercise per Session: 30 min  Stress: No Stress Concern Present (01/27/2024)   [name] of Occupational Health - Occupational Stress Questionnaire    Feeling of Stress : Not at all  Social Connections: Socially Integrated (01/27/2024)   Social Connection and Isolation Panel    Frequency of Communication with Friends and Family: More than three times a week    Frequency of Social Gatherings with Friends and Family: More than three times a week    Attends Religious Services: More than 4 times per year    Active Member of [employer] or Organizations: Yes    Attends Engineer, structural: More than 4 times per year    Marital Status: Married  Catering manager  Violence: Not At Risk (06/30/2024)   Humiliation, Afraid, Rape, and Kick questionnaire    Fear of Current or Ex-Partner: No    Emotionally Abused: No    Physically Abused: No    Sexually Abused: No    FAMILY HISTORY: Family History  Problem Relation Age of Onset   CAD Father 1       died age 55   Early death Father    Heart disease Father    CAD Brother 30       small vessel disease   Early death Brother    Heart disease Brother    Atrial fibrillation Mother    Diabetes Mother    Hypertension Mother    Arthritis Mother    Hearing loss Mother    CAD Cousin        paternal   CAD Paternal Grandfather        early onset   Colon cancer Neg Hx     ALLERGIES:  is allergic to sulfa antibiotics and sulfamethoxazole-trimethoprim.  MEDICATIONS:  Current Outpatient Medications  Medication Sig Dispense Refill   ASPIRIN  81 PO Take by mouth.     Calcium  Carbonate-Vitamin D  (CALCIUM  + D PO) Take 600 mg by mouth 2 (two) times daily.     cholecalciferol  (VITAMIN D ) 1000 units tablet  Take 1,000 Units by mouth 2 (two) times daily.      clopidogrel  (PLAVIX ) 75 MG tablet TAKE 1 TABLET DAILY 90 tablet 3   Continuous Blood Gluc Sensor (DEXCOM G6 SENSOR) MISC by Does not apply route.     cyanocobalamin  (VITAMIN B12) 1000 MCG tablet Take 1,000 mcg by mouth daily.     ezetimibe  (ZETIA ) 10 MG tablet Take 1 tablet (10 mg total) by mouth daily. 90 tablet 3   gabapentin  (NEURONTIN ) 100 MG capsule Take 2 capsules (200 mg total) by mouth at bedtime. 180 capsule 0   ibuprofen (ADVIL) 200 MG tablet Take 200 mg by mouth every 6 (six) hours as needed.     Insulin  Lispro-aabc (LYUMJEV ) 100 UNIT/ML SOLN USE A MAXIMUM OF 90 UNITS DAILY VIA INSULIN  PUMP 80 mL 3   losartan  (COZAAR ) 100 MG tablet Take 1 tablet (100 mg total) by mouth daily. 90 tablet 3   metFORMIN  (GLUCOPHAGE -XR) 500 MG 24 hr tablet TAKE 1 TABLET IN THE MORNING AND 2 TABLETS IN THE EVENING AS DIRECTED 270 tablet 3   metoprolol  succinate (TOPROL -XL) 100 MG 24 hr tablet TAKE 1 TABLET DAILY 90 tablet 3   Multiple Vitamins-Minerals (PRESERVISION AREDS 2 PO) Take by mouth in the morning and at bedtime.     nitroGLYCERIN  (NITROSTAT ) 0.4 MG SL tablet DISSOLVE 1 TABLET UNDER THE TONGUE EVERY 5 MINUTES AS NEEDED FOR CHEST PAIN 75 tablet 2   Pyridoxine HCl (VITAMIN B6) 100 MG TABS      rosuvastatin  (CRESTOR ) 40 MG tablet Take 1 tablet (40 mg total) by mouth daily. 90 tablet 3   solifenacin  (VESICARE ) 10 MG tablet Take 1 tablet (10 mg total) by mouth daily. 90 tablet 3   TART CHERRY PO Take by mouth.     No current facility-administered medications for this visit.    REVIEW OF SYSTEMS:   Constitutional: Denies fevers, chills or abnormal night sweats Eyes: Denies blurriness of vision, double vision or watery eyes Ears, nose, mouth, throat, and face: Denies mucositis or sore throat Respiratory: Denies cough, dyspnea or wheezes Cardiovascular: Denies palpitation, chest discomfort or lower extremity swelling Gastrointestinal:  Denies  nausea, heartburn or  change in bowel habits Skin: Denies abnormal skin rashes Lymphatics: Denies new lymphadenopathy or easy bruising Neurological:Denies numbness, tingling or new weaknesses Behavioral/Psych: Mood is stable, no new changes  All other systems were reviewed with the patient and are negative.  PHYSICAL EXAMINATION: ECOG PERFORMANCE STATUS: 1 - Symptomatic but completely ambulatory  Vitals:   06/30/24 1341  BP: (!) 146/64  Pulse: 77  Resp: 19  Temp: 97.7 F (36.5 C)  SpO2: 98%   Filed Weights   06/30/24 1341  Weight: 199 lb 1.6 oz (90.3 kg)    GENERAL:alert, no distress and comfortable SKIN: skin color, texture, turgor are normal, no rashes or significant lesions EYES: normal, conjunctiva are pink and non-injected, sclera clear OROPHARYNX:no exudate, no erythema and lips, buccal mucosa, and tongue normal  NECK: supple, thyroid  normal size, non-tender, without nodularity LYMPH:  no palpable lymphadenopathy in the cervical, axillary or inguinal LUNGS: clear to auscultation and percussion with normal breathing effort HEART: regular rate & rhythm and no murmurs and no lower extremity edema ABDOMEN:abdomen soft, non-tender and normal bowel sounds Musculoskeletal:no cyanosis of digits and no clubbing  PSYCH: alert & oriented x 3 with fluent speech NEURO: no focal motor/sensory deficits  Physical Exam CHEST: Lungs with slight noise on auscultation. BREAST: Left breast with palpable 2x2 cm lump, possibly post-biopsy bleeding. ABDOMEN: Abdomen non-tender. MUSCULOSKELETAL: Spine non-tender.  LABORATORY DATA:  I have reviewed the data as listed    Latest Ref Rng & Units 11/22/2019    8:49 AM 04/11/2017    6:42 AM 03/27/2017   10:13 AM  CBC  WBC 3.4 - 10.8 x10E3/uL 6.5  5.5  6.5   Hemoglobin 11.1 - 15.9 g/dL 85.5  87.1  86.0   Hematocrit 34.0 - 46.6 % 43.9  39.5  41.3   Platelets 150 - 450 x10E3/uL 246  321  302        Latest Ref Rng & Units 03/26/2024    9:31  AM 03/23/2024    3:03 PM 12/22/2023    9:22 AM  CMP  Glucose 65 - 99 mg/dL 894  899  879   BUN 7 - 25 mg/dL 11  11  11    Creatinine 0.60 - 1.00 mg/dL 9.33  9.30  9.36   Sodium 135 - 146 mmol/L 140  140  140   Potassium 3.5 - 5.3 mmol/L 4.8  4.5  4.8   Chloride 98 - 110 mmol/L 104  103  104   CO2 20 - 32 mmol/L 30  22  32   Calcium  8.6 - 10.4 mg/dL 9.8  9.4  9.3      RADIOGRAPHIC STUDIES: I have personally reviewed the radiological images as listed and agreed with the findings in the report. No results found.  Assessment & Plan Malignant neoplasm of upper outer quadrant of left breast, invasive lobular carcinoma of left breast, ER/PR/HER2 positive, stage 1B, grade 2 Invasive lobular carcinoma of the left breast, located in the upper outer quadrant, measuring 3.5 cm on contrast mammogram. It is ER/PR/HER2 positive, stage 1B. The cancer is considered early stage with a good prognosis, but HER2 positivity indicates a more aggressive nature. Lymph nodes appear normal on ultrasound, but lobular carcinoma can be sneaky, so sentinel lymph nodes biopsy is recommended.   The goal is cure with a more than 70% chance of success depending on the final stage. HER2 positive cancers grow fast, but this is not the most aggressive type. - Proceed with lumpectomy and possible lymph node  removal. - Due to the HER2 positive disease, I recommend adjuvant chemotherapy.  I discussed option of weekly paclitaxel plus trastuzumab (Herceptin) for 12 weeks, and combination of docetaxel, Cytoxan and trastuzumab.  Due to her advanced age, she may not tolerate intensive chemo well.  But I will consider docetaxel plus Cytoxan plus Herceptin if the tumor is larger than expected or if she has positive nodes. - Administer HER2 antibody therapy for one year. - Discuss the use of DigniCap or ColdCap to minimize hair loss during chemotherapy. - Consider hormonal therapy with tamoxifen or anastrozole post-chemotherapy. - Schedule  a chemotherapy education session post-surgery. - Recommend port placement for chemotherapy and antibody administration. - Discuss potential participation in clinical trials.  Type 1 diabetes mellitus, insulin -dependent Type 1 diabetes mellitus, insulin -dependent, with a recent A1c of 7, indicating good control.  Atrial flutter, status post ablation Atrial flutter, status post ablation in 2014. Currently managed with 81 mg aspirin  daily.  Coronary artery disease, status post stent placement Coronary artery disease, status post stent placement in 2009. No recent chest pain reported. Prescribed nitroglycerin  for emergencies but has not been needed.  Hypertension Hypertension, managed with current medications.  Osteoarthritis of back and thumbs Osteoarthritis in the back and thumbs, managed with as-needed Advil.  Plan - I reviewed her images, and the biopsy results. - She will proceed with lumpectomy and sentinel lymph node biopsy soon.  She will have port placement still her breast surgery. - I plan to see her back 2 weeks after surgery, to finalize her adjuvant chemotherapy. - Echocardiogram   No orders of the defined types were placed in this encounter.   All questions were answered. The patient knows to call the clinic with any problems, questions or concerns. I spent 50 minutes counseling the patient face to face. The total time spent in the appointment was 60 minutes including review of chart and various tests results, discussions about plan of care and coordination of care plan.     Onita Mattock, MD 06/30/2024 3:19 PM

## 2024-06-30 NOTE — Therapy (Signed)
 OUTPATIENT PHYSICAL THERAPY BREAST CANCER BASELINE EVALUATION   Patient Name: [name] MRN: [id number] DOB:1946/06/26, 78 y.o., female Today's Date: 06/30/2024  END OF SESSION:  PT End of Session - 06/30/24 1440     Visit Number 1    Number of Visits 2    Date for PT Re-Evaluation 08/25/24    PT Start Time 1541    PT Stop Time 1615    PT Time Calculation (min) 34 min    Activity Tolerance Patient tolerated treatment well    Behavior During Therapy [facility] for tasks assessed/performed          Past Medical History:  Diagnosis Date   Arthritis    OA   Atrial flutter (HCC)    a. s/p TEE/DCCV 08/16/13 (normal LV function, no LAA thrombus).b. s/p ablation by Dr Waddell 09-23-2013   Coronary artery disease    2009 LAD Promus stent   Dysrhythmia    Aflutter, no issues after ablation 2014   Fibroid    Hyperlipidemia    Hypertension    Insulin  pump in place    Osteopenia    Type 1 diabetes mellitus on insulin  therapy (HCC)    Valvular heart disease    a. Mild MR/TR by TEE 08/2013.   Past Surgical History:  Procedure Laterality Date   ABLATION  09/23/2013   CTI by Dr Waddell   APPENDECTOMY     APPLICATION OF WOUND VAC Left 04/11/2017   Procedure: APPLICATION OF WOUND VAC LEFT KNEE;  Surgeon: Fidel Rogue, MD;  Location: [facility] OR;  Service: Orthopedics;  Laterality: Left;   ATRIAL FLUTTER ABLATION N/A 09/23/2013   Procedure: ATRIAL FLUTTER ABLATION;  Surgeon: Danelle LELON Waddell, MD;  Location: [facility] CATH LAB;  Service: Cardiovascular;  Laterality: N/A;   CARDIOVERSION N/A 08/16/2013   Procedure: CARDIOVERSION;  Surgeon: Aleene JINNY Passe, MD;  Location: [facility] ENDOSCOPY;  Service: Cardiovascular;  Laterality: N/A;  spoke with Tom   CATARACT EXTRACTION Bilateral    COLONOSCOPY     CORONARY ANGIOPLASTY WITH STENT PLACEMENT     EYE SURGERY     Cataract   LASER ABLATION Right 11/20/2023   Right leg laser ablation of the Greater saphenous vein with >20 stab phlebectomies   MYOMECTOMY   1977   ORIF PATELLA Left 04/11/2017   ORIF PATELLA Left 04/11/2017   Procedure: OPEN REDUCTION INTERNAL (ORIF) FIXATION PATELLA LEFT KNEE;  Surgeon: Fidel Rogue, MD;  Location: [facility] OR;  Service: Orthopedics;  Laterality: Left;   PELVIC LAPAROSCOPY     TEE WITHOUT CARDIOVERSION N/A 08/16/2013   Procedure: TRANSESOPHAGEAL ECHOCARDIOGRAM (TEE);  Surgeon: Aleene JINNY Passe, MD;  Location: [facility] ENDOSCOPY;  Service: Cardiovascular;  Laterality: N/A;   TUBAL LIGATION  1988   Patient Active Problem List   Diagnosis Date Noted   Malignant neoplasm of upper-outer quadrant of left breast in female, estrogen receptor positive (HCC) 06/28/2024   Acute lateral meniscal tear, right, initial encounter 09/02/2023   Varicose veins of both lower extremities with pain 07/04/2023   Arthritis of carpometacarpal [facility]) joint of both thumbs 01/17/2022   Bilateral hearing loss 01/08/2022   Hypertension 05/13/2019   Urinary incontinence 01/19/2019   Valvular heart disease    Insulin  pump in place    Fibroid    Osteoarthritis    Mixed diabetic hyperlipidemia associated with type 1 diabetes mellitus (HCC) 03/16/2015   Type 1 diabetes mellitus with complication (HCC) 05/12/2013   Chronic insomnia 03/25/2012   Osteopenia    Atherosclerosis of  native coronary artery of native heart with angina pectoris (HCC) 04/05/2011    REFERRING PROVIDER: Dr. Vicenta Poli  REFERRING DIAG: Left breast cancer  THERAPY DIAG:  Malignant neoplasm of upper-outer quadrant of left breast in female, estrogen receptor positive (HCC)  Abnormal posture  Rationale for Evaluation and Treatment: Rehabilitation  ONSET DATE: 06/08/2024  SUBJECTIVE:                                                                                                                                                                                           SUBJECTIVE STATEMENT: Patient reports she is here today to be seen by her medical team for her newly  diagnosed left breast cancer.   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 It measures 3.5 cm and is located in the upper outer quadrant. It is triple positive with a Ki67 of 3%.   PATIENT GOALS:   reduce lymphedema risk and learn post op HEP.   PAIN:  Are you having pain? No but sometimes has bil thumb pain from OA  PRECAUTIONS: Active CA   RED FLAGS: None   HAND DOMINANCE: right  WEIGHT BEARING RESTRICTIONS: No  FALLS:  Has patient fallen in last 6 months? No  LIVING ENVIRONMENT: Patient lives with: husband Lives in: House/apartment Has following equipment at home: None  OCCUPATION: Homemaker  LEISURE: She bikes and lifts weights but reports it is infrequently  PRIOR LEVEL OF FUNCTION: Independent   OBJECTIVE: Note: Objective measures were completed at Evaluation unless otherwise noted.  COGNITION: Overall cognitive status: Within functional limits for tasks assessed    POSTURE:  Forward head and rounded shoulders posture  UPPER EXTREMITY AROM/PROM:  A/PROM RIGHT   eval   Shoulder extension 50  Shoulder flexion 147  Shoulder abduction 151  Shoulder internal rotation 72  Shoulder external rotation 43    (Blank rows = not tested)  A/PROM LEFT   eval  Shoulder extension 51  Shoulder flexion 135  Shoulder abduction 157  Shoulder internal rotation 57  Shoulder external rotation 79    (Blank rows = not tested)  CERVICAL AROM: All within normal limits:    Percent limited  Flexion WNL  Extension WFL  Right lateral flexion 50% limited  Left lateral flexion 75% limited  Right rotation 50% limited  Left rotation           50% limited    UPPER EXTREMITY STRENGTH: WFL  LYMPHEDEMA ASSESSMENTS (in cm):   LANDMARK RIGHT   eval  10 cm proximal to olecranon process 27.7  Olecranon process 26.5  10 cm proximal to ulnar styloid process 22  Just proximal to ulnar styloid process  17.4  Across hand at thumb web space 18.8  At base of 2nd digit 6.9  (Blank rows = not tested)  LANDMARK LEFT   eval  10 cm proximal to olecranon process 27.2  Olecranon process 26.1  10 cm proximal to ulnar styloid process 21.9  Just proximal to ulnar styloid process 17.7  Across hand at thumb web space 19.4  At base of 2nd digit 6.8  (Blank rows = not tested)  L-DEX LYMPHEDEMA SCREENING:  The patient was assessed using the L-Dex machine today to produce a lymphedema index baseline score. The patient will be reassessed on a regular basis (typically every 3 months) to obtain new L-Dex scores. If the score is > 6.5 points away from his/her baseline score indicating onset of subclinical lymphedema, it will be recommended to wear a compression garment for 4 weeks, 12 hours per day and then be reassessed. If the score continues to be > 6.5 points from baseline at reassessment, we will initiate lymphedema treatment. Assessing in this manner has a 95% rate of preventing clinically significant lymphedema.   L-DEX FLOWSHEETS - 06/30/24 1400       L-DEX LYMPHEDEMA SCREENING   Measurement Type Unilateral    L-DEX MEASUREMENT EXTREMITY Upper Extremity    POSITION  Standing    DOMINANT SIDE Right    At Risk Side Left    BASELINE SCORE (UNILATERAL) 9.6          QUICK DASH SURVEY:  Junie Palin - 06/30/24 0001     Open a tight or new jar Mild difficulty    Do heavy household chores (wash walls, wash floors) No difficulty    Carry a shopping bag or briefcase No difficulty    Wash your back No difficulty    Use a knife to cut food No difficulty    Recreational activities in which you take some force or impact through your arm, shoulder, or hand (golf, hammering, tennis) No difficulty    During the past week, to what extent has your arm, shoulder or hand problem interfered with your normal social activities with family, friends, neighbors, or groups? Not at all    During the past week, to  what extent has your arm, shoulder or hand problem limited your work or other regular daily activities Not at all    Arm, shoulder, or hand pain. Moderate    Tingling (pins and needles) in your arm, shoulder, or hand None    Difficulty Sleeping No difficulty    DASH Score 6.82 %           PATIENT EDUCATION:  Education details: Time spent educating patient on aspects of self-care to maximize post op recovery. Patient was educated on where and how to get a post op compression bra to use to reduce post op edema. Patient was also educated on the use of SOZO screenings and surveillance principles for early identification of lymphedema onset. She was instructed to use the post op pillow in the axilla for pressure and pain relief. Patient educated on lymphedema risk reduction and post op shoulder/posture HEP. Person educated: Patient Education method: Explanation, Demonstration, Handout Education comprehension: Patient verbalized understanding and returned demonstration  HOME EXERCISE PROGRAM: Patient was instructed today in a home exercise program today for post op shoulder range of motion. These included active assist shoulder flexion in sitting, scapular retraction, wall walking with shoulder abduction, and  hands behind head external rotation.  She was encouraged to do these twice a day, holding 3 seconds and repeating 5 times when permitted by her physician.   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 It measures 3.5 cm and is located in the upper outer quadrant. It is triple positive with a Ki67 of 3%. Her multidisciplinary medical team met prior to her assessments to determine a recommended treatment plan. She is planning to have a lumpectomy and sentinel node biopsy followed by chemotherapy, radiation, and anti-estrogen therapy. She will benefit from a post op PT reassessment to determine needs and from  L-Dex screens every 3 months for 2 years to detect subclinical lymphedema.  Pt will benefit from skilled therapeutic intervention to improve on the following deficits: Decreased knowledge of precautions, impaired UE functional use, pain, decreased ROM, postural dysfunction.   PT treatment/interventions: ADL/self-care home management, pt/family education, therapeutic exercise  REHAB POTENTIAL: Excellent  CLINICAL DECISION MAKING: Stable/uncomplicated  EVALUATION COMPLEXITY: Low   GOALS: Goals reviewed with patient? YES  LONG TERM GOALS: (STG=LTG)    Name Target Date Goal status  1 Pt will be able to verbalize understanding of pertinent lymphedema risk reduction practices relevant to her dx specifically related to skin care.  Baseline:  No knowledge 06/30/2024 Achieved at eval  2 Pt will be able to return demo and/or verbalize understanding of the post op HEP related to regaining shoulder ROM. Baseline:  No knowledge 06/30/2024 Achieved at eval  3 Pt will be able to verbalize understanding of the importance of viewing the post op After Breast CA Class video for further lymphedema risk reduction education and therapeutic exercise.  Baseline:  No knowledge 06/30/2024 Achieved at eval  4 Pt will demo she has regained full shoulder ROM and function post operatively compared to baselines.  Baseline: See objective measurements taken today. 08/25/2024     PLAN:  PT FREQUENCY/DURATION: EVAL and 1 follow up appointment.   PLAN FOR NEXT SESSION: will reassess 3-4 weeks post op to determine needs.   Patient will follow up at outpatient cancer rehab 3-4 weeks following surgery.  If the patient requires physical therapy at that time, a specific plan will be dictated and sent to the referring physician for approval. The patient was educated today on appropriate basic range of motion exercises to begin post operatively and the importance of viewing the After Breast Cancer class video following  surgery.  Patient was educated today on lymphedema risk reduction practices as it pertains to recommendations that will benefit the patient immediately following surgery.  She verbalized good understanding.    Physical Therapy Information for After Breast Cancer Surgery/Treatment:  Lymphedema is a swelling condition that you may be at risk for in your arm if you have lymph nodes removed from the armpit area.  After a sentinel node biopsy, the risk is approximately 5-9% and is higher after an axillary node dissection.  There is treatment available for this condition and it is not life-threatening.  Contact your physician or physical therapist with concerns. You may begin the 4 shoulder/posture exercises (see additional sheet) when permitted by your physician (typically a week after surgery).  If you have drains, you may need to wait until those are removed before beginning range of motion exercises.  A general recommendation is to not lift your arms above shoulder height until drains are removed.  These exercises should be done to your tolerance and gently.  This is not a no pain/no gain type of recovery so listen to your body and stretch into the range of motion that you can tolerate, stopping if you have pain.  If you are having immediate reconstruction, ask your plastic surgeon about doing exercises as he or she may want you to wait. We encourage you to view the After Breast Cancer class video following surgery.  You will learn information related to lymphedema risk, prevention and treatment and additional exercises to regain mobility following surgery.   While undergoing any medical procedure or treatment, try to avoid blood pressure being taken or needle sticks from occurring on the arm on the side of cancer.   This recommendation begins after surgery and continues for the rest of your life.  This may help reduce your risk of getting lymphedema (swelling in your arm). An excellent resource for those  seeking information on lymphedema is the National Lymphedema Network's web site. It can be accessed at www.lymphnet.org If you notice swelling in your hand, arm or breast at any time following surgery (even if it is many years from now), please contact your doctor or physical therapist to discuss this.  Lymphedema can be treated at any time but it is easier for you if it is treated early on.  If you feel like your shoulder motion is not returning to normal in a reasonable amount of time, please contact your surgeon or physical therapist.  [facility] [phone]. [address], [place] [address]  ABC CLASS After Breast Cancer Class  After Breast Cancer Class is a specially designed exercise class video to assist you in a safe recover after having breast cancer surgery.  In this video you will learn how to get back to full function whether your drains were just removed or if you had surgery a month ago. The video can be viewed on this page: [email or web address]-meyer.[email or web address] or on YouTube here: [email or web address].  Class Goals  Understand specific stretches to improve the flexibility of you chest and shoulder. Learn ways to safely strengthen your upper body and improve your posture. Understand the warning signs of infection and why you may be at risk for an arm infection. Learn about Lymphedema and prevention.  ** You do not need to view this video until after surgery.  Drains should be removed to participate in the recommended exercises on the video.  Patient was instructed today in a home exercise program today for post op shoulder range of motion. These included active assist shoulder flexion in sitting, scapular retraction, wall walking with shoulder abduction, and hands behind head external rotation.  She was encouraged to do these twice a day, holding 3 seconds and repeating 5 times  when permitted by her physician.  Eward Wonda Sharps, [place] 06/30/24 6:07 PM

## 2024-07-01 ENCOUNTER — Encounter: Payer: Self-pay | Admitting: Radiology

## 2024-07-01 ENCOUNTER — Telehealth: Payer: Self-pay

## 2024-07-01 NOTE — Telephone Encounter (Signed)
   Name: RINDA ROLLYSON  DOB: 08-05-1946  MRN: [id number]  Primary Cardiologist: [name], MD (Inactive)  Chart reviewed as part of pre-operative protocol coverage. The patient has an upcoming visit scheduled with Dr. Lavona on 07/08/24 at which time clearance can be addressed in case there are any issues that would impact surgical recommendations.  I added preop FYI to appointment note so that provider is aware to address at time of outpatient visit.  Per office protocol the cardiology provider should forward their finalized clearance decision and recommendations regarding antiplatelet therapy to the requesting party below.     I will route this message as FYI to requesting party and remove this message from the preop box as separate preop APP input not needed at this time.   Please call with any questions.  Wyn Raddle, Jackee Shove, NP  07/01/2024, 11:07 AM

## 2024-07-01 NOTE — Telephone Encounter (Signed)
   Pre-operative Risk Assessment    Patient Name: [name]  DOB: December 31, 1945 MRN: [id number]   Date of last office visit: 01/16/24 [name], MD Date of next office visit: 07/08/24 [name], MD   Request for Surgical Clearance    Procedure:  LUMPECTOMY SURGERY  Date of Surgery:  Clearance TBD                                Surgeon:  VICENTA POLI, MD Surgeon's Group or Practice Name:  [facility] Phone number:  [phone] Fax number:  [phone]   ATTN: DICKEY GUESS, CMA   Type of Clearance Requested:   - Medical  - Pharmacy:  Hold Aspirin  and Clopidogrel  (Plavix )     Type of Anesthesia:  General    Additional requests/questions:    SignedLucie DELENA Ku   07/01/2024, 10:29 AM

## 2024-07-02 ENCOUNTER — Ambulatory Visit (HOSPITAL_COMMUNITY)
Admission: RE | Admit: 2024-07-02 | Discharge: 2024-07-02 | Disposition: A | Discharge status: Home / Self Care | Source: Ambulatory Visit | Attending: Hematology | Admitting: Hematology

## 2024-07-02 DIAGNOSIS — C50412 Malignant neoplasm of upper-outer quadrant of left female breast: Secondary | ICD-10-CM | POA: Diagnosis not present

## 2024-07-02 DIAGNOSIS — J90 Pleural effusion, not elsewhere classified: Secondary | ICD-10-CM | POA: Insufficient documentation

## 2024-07-02 DIAGNOSIS — I499 Cardiac arrhythmia, unspecified: Secondary | ICD-10-CM | POA: Diagnosis not present

## 2024-07-02 DIAGNOSIS — E119 Type 2 diabetes mellitus without complications: Secondary | ICD-10-CM | POA: Insufficient documentation

## 2024-07-02 DIAGNOSIS — I2510 Atherosclerotic heart disease of native coronary artery without angina pectoris: Secondary | ICD-10-CM | POA: Diagnosis not present

## 2024-07-02 DIAGNOSIS — E785 Hyperlipidemia, unspecified: Secondary | ICD-10-CM | POA: Insufficient documentation

## 2024-07-02 DIAGNOSIS — Z0189 Encounter for other specified special examinations: Secondary | ICD-10-CM

## 2024-07-02 DIAGNOSIS — Z170 Estrogen receptor positive status [ER+]: Secondary | ICD-10-CM

## 2024-07-02 DIAGNOSIS — I071 Rheumatic tricuspid insufficiency: Secondary | ICD-10-CM | POA: Insufficient documentation

## 2024-07-02 DIAGNOSIS — Z01818 Encounter for other preprocedural examination: Secondary | ICD-10-CM | POA: Diagnosis not present

## 2024-07-02 DIAGNOSIS — I4892 Unspecified atrial flutter: Secondary | ICD-10-CM | POA: Diagnosis not present

## 2024-07-02 DIAGNOSIS — I10 Essential (primary) hypertension: Secondary | ICD-10-CM | POA: Diagnosis not present

## 2024-07-02 DIAGNOSIS — R079 Chest pain, unspecified: Secondary | ICD-10-CM | POA: Diagnosis not present

## 2024-07-02 DIAGNOSIS — R9431 Abnormal electrocardiogram [ECG] [EKG]: Secondary | ICD-10-CM | POA: Insufficient documentation

## 2024-07-02 LAB — ECHOCARDIOGRAM COMPLETE
Area-P 1/2: 3.03 cm2
Calc EF: 58.3 %
S' Lateral: 3.3 cm
Single Plane A2C EF: 61.3 %
Single Plane A4C EF: 56.2 %

## 2024-07-02 NOTE — Progress Notes (Signed)
  Echocardiogram 2D Echocardiogram has been performed.  [name] 07/02/2024, 11:47 AM

## 2024-07-03 DIAGNOSIS — Z23 Encounter for immunization: Secondary | ICD-10-CM | POA: Diagnosis not present

## 2024-07-05 ENCOUNTER — Encounter: Payer: Self-pay | Admitting: Cardiology

## 2024-07-05 DIAGNOSIS — I4892 Unspecified atrial flutter: Secondary | ICD-10-CM | POA: Insufficient documentation

## 2024-07-05 NOTE — Progress Notes (Unsigned)
  Cardiology Office Note:   Date:  07/08/2024  ID:  [name], DOB 01-17-1946, MRN [id number] PCP: Rollene Almarie LABOR, MD  [facility] Providers Cardiologist:  [name], MD {  History of Present Illness:   [name] is a 78 y.o. female who was seen by Dr. Alveta.  She has a history of CAD and atrial flutter.  Since she was seen last she has done well.  She walks her dog.  She takes care of her 4-month-old grandchild.  She is not doing formal exercise but with her level of activity she denies any cardiovascular symptoms. The patient denies any new symptoms such as chest discomfort, neck or arm discomfort. There has been no new shortness of breath, PND or orthopnea. There have been no reported palpitations, presyncope or syncope.  In particular she has not had any problems with her atrial fibrillation since ablation.  She has been keeping a blood pressure diary and the are not well-controlled.   ROS: As stated in the HPI and negative for all other systems.  Studies Reviewed:    EKG:   EKG Interpretation Date/Time:  Thursday July 08 2024 09:25:26 EDT Ventricular Rate:  67 PR Interval:  136 QRS Duration:  92 QT Interval:  380 QTC Calculation: 401 R Axis:   36  Text Interpretation: Normal sinus rhythm Nonspecific T wave abnormality When compared with ECG of 16-Jan-2024 10:55, No significant change was found Confirmed by Schilling Lynwood ([id number]) on 07/08/2024 9:53:04 AM     Risk Assessment/Calculations:    CHA2DS2-VASc Score = 6   This indicates a 9.7% annual risk of stroke. The patient's score is based upon: CHF History: 0 HTN History: 1 Diabetes History: 1 Stroke History: 0 Vascular Disease History: 1 Age Score: 2 Gender Score: 1    Physical Exam:   VS:  BP (!) 144/70   Pulse 67   Ht 5' 9.5 (1.765 m)   Wt 198 lb (89.8 kg)   SpO2 97%   BMI 28.82 kg/m    Wt Readings from Last 3 Encounters:  07/08/24 198 lb (89.8 kg)  06/30/24 199 lb 1.6 oz  (90.3 kg)  03/31/24 199 lb 12.8 oz (90.6 kg)     GEN: Well nourished, well developed in no acute distress NECK: No JVD; No carotid bruits CARDIAC: RRR, no murmurs, rubs, gallops RESPIRATORY:  Clear to auscultation without rales, wheezing or rhonchi  ABDOMEN: Soft, non-tender, non-distended EXTREMITIES:  No edema; No deformity   ASSESSMENT AND PLAN:   CAD:  The patient has no new sypmtoms.  No further cardiovascular testing is indicated.  I will discontinue her aspirin  and to be on Plavix  alone.  Atrial Flutter/fib :  She had atrial fib ablation in the past.   No change in therapy   Diabetes Mellitus: A1c was 6.9.  She is followed by endocrinology.  I will defer to their management.  HTN: Her blood pressure is not at target.  I am gena add spironolactone  25 mg daily.  Need to check a basic metabolic profile in 1 month and in 3 months.  Dyslipidemia: LDL is at target.  Continue the meds as listed.   Follow up with me in 1 year.  Signed, [name], MD

## 2024-07-06 ENCOUNTER — Ambulatory Visit: Payer: Self-pay | Admitting: Internal Medicine

## 2024-07-07 ENCOUNTER — Telehealth: Payer: Self-pay | Admitting: *Deleted

## 2024-07-07 ENCOUNTER — Encounter: Payer: Self-pay | Admitting: Hematology

## 2024-07-07 NOTE — Telephone Encounter (Signed)
 [employer] 2021-05 - Specimen Collection Study to Evaluate Biomarkers in Subjects with Cancer   Called patient to follow up on the above study.  Patient states she is interested in participating. She does not have any questions at this time and says she has not had a chance to read the consent form yet but she found it while we were on the phone and says she will read it. Patient is going on vacation for one week and asks for Research nurse to follow up after she returns from her vacation on 9/15. Patient's surgery has not been scheduled yet. She thinks they are waiting for clearance from cardiologist. Patient has appt with Cardiologist tomorrow. Patient understands if she wants to enroll in this research study she will need to return to the Cancer Center to meet with research nurse for consent and blood draw prior to her surgery.  Thanked patient for her time today and will plan to call her again on 9/15 to schedule research visit and lab. Gave patient my direct number and encouraged her to call if any questions before our next contact. She verbalized understanding.  Cherylyn Hoard, BSN, RN, [employer] Clinical Research Nurse II [phone] 07/07/2024 11:24 AM

## 2024-07-08 ENCOUNTER — Encounter: Payer: Self-pay | Admitting: *Deleted

## 2024-07-08 ENCOUNTER — Telehealth: Payer: Self-pay | Admitting: *Deleted

## 2024-07-08 ENCOUNTER — Encounter: Payer: Self-pay | Admitting: Cardiology

## 2024-07-08 ENCOUNTER — Other Ambulatory Visit (HOSPITAL_COMMUNITY): Payer: Self-pay

## 2024-07-08 ENCOUNTER — Ambulatory Visit: Attending: Cardiology | Admitting: Cardiology

## 2024-07-08 VITALS — BP 144/70 | HR 67 | Ht 69.5 in | Wt 198.0 lb

## 2024-07-08 DIAGNOSIS — I2510 Atherosclerotic heart disease of native coronary artery without angina pectoris: Secondary | ICD-10-CM | POA: Insufficient documentation

## 2024-07-08 DIAGNOSIS — I4892 Unspecified atrial flutter: Secondary | ICD-10-CM | POA: Diagnosis not present

## 2024-07-08 DIAGNOSIS — E118 Type 2 diabetes mellitus with unspecified complications: Secondary | ICD-10-CM | POA: Diagnosis not present

## 2024-07-08 MED ORDER — SPIRONOLACTONE 25 MG PO TABS
25.0000 mg | ORAL_TABLET | Freq: Every day | ORAL | 3 refills | Status: AC
  Filled 2024-07-08: qty 90, 90d supply, fill #0

## 2024-07-08 MED ORDER — SPIRONOLACTONE 25 MG PO TABS: 25.0000 mg | ORAL_TABLET | Freq: Every day | ORAL | 3 refills | Status: DC

## 2024-07-08 NOTE — Patient Instructions (Addendum)
 Medication Instructions:  Start Spironolactone  25 mg once daily Stop Aspirin  *If you need a refill on your cardiac medications before your next appointment, please call your pharmacy*  Lab Work: BMET in 1 month BMET in 3 months If you have labs (blood work) drawn today and your tests are completely normal, you will receive your results only by: MyChart Message (if you have MyChart) OR A paper copy in the mail If you have any lab test that is abnormal or we need to change your treatment, we will call you to review the results.  Testing/Procedures: NONE  Follow-Up: At [facility], you and your health needs are our priority.  As part of our continuing mission to provide you with exceptional heart care, our providers are all part of one team.  This team includes your primary Cardiologist (physician) and Advanced Practice Providers or APPs (Physician Assistants and Nurse Practitioners) who all work together to provide you with the care you need, when you need it.  Your next appointment:   1 year(s)  Provider:   Lavona, MD  We recommend signing up for the patient portal called MyChart.  Sign up information is provided on this After Visit Summary.  MyChart is used to connect with patients for Virtual Visits (Telemedicine).  Patients are able to view lab/test results, encounter notes, upcoming appointments, etc.  Non-urgent messages can be sent to your provider as well.   To learn more about what you can do with MyChart, go to [email or web address].

## 2024-07-08 NOTE — Telephone Encounter (Signed)
 Spoke with patient to follow up from [facility] 8/27 and assess navigation needs.  Patient denies any questions or concerns at this time. Patient inquires about dignicap.  Discuss next steps and emailed her the information. She is not quite sure she wants to go through with that but will let us  know.  Encouraged her to call with any questions or concerns.

## 2024-07-09 ENCOUNTER — Other Ambulatory Visit: Payer: Self-pay | Admitting: Surgery

## 2024-07-09 ENCOUNTER — Encounter: Payer: Self-pay | Admitting: General Practice

## 2024-07-09 NOTE — Progress Notes (Signed)
 [facility] Spiritual Care Note  Met with [name] in [facility] to introduce Support Center team/resources.  she completed SDOH screening; results follow below.  SDOH Interventions    Flowsheet Row Clinical Support from 01/27/2024 in [facility] at [facility] Clinical Support from 09/10/2022 in [facility] at [facility] Chronic Care Management from 03/01/2021 in [facility] at [facility]  SDOH Interventions     Food Insecurity Interventions Intervention Not Indicated Intervention Not Indicated --  Housing Interventions Intervention Not Indicated Intervention Not Indicated --  Transportation Interventions Intervention Not Indicated Intervention Not Indicated --  Utilities Interventions Intervention Not Indicated Intervention Not Indicated --  Alcohol  Usage Interventions Intervention Not Indicated (Score <7) Intervention Not Indicated (Score <7) --  Depression Interventions/Treatment  PHQ2-9 Score <4 Follow-up Not Indicated -- --  Financial Strain Interventions Intervention Not Indicated Intervention Not Indicated Intervention Not Indicated  Physical Activity Interventions Intervention Not Indicated Intervention Not Indicated --  Stress Interventions Intervention Not Indicated Intervention Not Indicated --  Social Connections Interventions Intervention Not Indicated Intervention Not Indicated --  Health Literacy Interventions Intervention Not Indicated -- --    SDOH Screenings   Food Insecurity: No Food Insecurity (06/30/2024)  Housing: Unknown (06/30/2024)  Transportation Needs: No Transportation Needs (06/30/2024)  Utilities: Not At Risk (06/30/2024)  Alcohol  Screen: Low Risk  (01/27/2024)  Depression (PHQ2-9): Low Risk  (06/30/2024)  Financial Resource Strain: Low Risk  (01/27/2024)  Physical Activity: Sufficiently Active (01/27/2024)  Recent Concern: Physical Activity - Insufficiently Active  (10/31/2023)  Social Connections: Socially Integrated (01/27/2024)  Stress: No Stress Concern Present (01/27/2024)  Tobacco Use: Low Risk  (07/08/2024)  Health Literacy: Adequate Health Literacy (01/27/2024)    Chaplain and patient discussed common feelings and emotions when being diagnosed with cancer, and the importance of support during treatment.  Chaplain informed patient of the support team and support services at [facility].  Chaplain provided contact information and encouraged patient to call with any questions or concerns.  Follow up needed: No. [name] has already been reading her information from [facility] and is aware of ongoing support team and programming availability. She was very pleased with [facility] and follow-up support, and she plans to reach out as needed/desired.  [address] Olam Corrigan, [place], [facility] Pager [phone] Voicemail [phone]

## 2024-07-15 ENCOUNTER — Encounter: Payer: Self-pay | Admitting: *Deleted

## 2024-07-16 ENCOUNTER — Telehealth: Payer: Self-pay | Admitting: Hematology

## 2024-07-16 NOTE — Telephone Encounter (Signed)
 LVM regarding her upcoming appt.

## 2024-07-19 ENCOUNTER — Other Ambulatory Visit: Payer: Self-pay

## 2024-07-19 ENCOUNTER — Telehealth: Payer: Self-pay | Admitting: *Deleted

## 2024-07-19 ENCOUNTER — Encounter (HOSPITAL_BASED_OUTPATIENT_CLINIC_OR_DEPARTMENT_OTHER): Payer: Self-pay | Admitting: Surgery

## 2024-07-19 NOTE — Telephone Encounter (Signed)
 Will route to the Preop pool for the Preop APP to review about the preop clearance and plavix  hold. Pt was seen in office 07/08/24 with Dr Lavona

## 2024-07-19 NOTE — Telephone Encounter (Signed)
 Dr. Lavona Lanius recently saw this patient for preop clearance and plavix  hold on 07/08/24. Can you please clarify for the surgeon's office?

## 2024-07-19 NOTE — Telephone Encounter (Signed)
 [employer] 2021-05 - Specimen Collection Study to Evaluate Biomarkers in Subjects with Cancer   Spoke with patient about the above study and she agreed to come in to [facility] tomorrow at 2 pm to meet with Research nurse for consent and then lab appointment to collect research blood.  Provided patient with my phone number to call if she needs to change the appointment. Informed patient this research nurse will come up to the lobby to meet her after she arrives. Patient verbalized understanding.  Cherylyn Hoard, BSN, RN, [employer] Clinical Research Nurse II [phone] 07/19/2024 11:32 AM

## 2024-07-19 NOTE — Telephone Encounter (Signed)
 Sari with [facility] is calling because Anesthesia does not see that Dr. Lavona cleared this patient at the appointment on 09/04. They are also needing directions on patient holding her Plavix  prior to surgery. Please advise.

## 2024-07-20 ENCOUNTER — Telehealth: Payer: Self-pay

## 2024-07-20 ENCOUNTER — Inpatient Hospital Stay: Attending: Hematology

## 2024-07-20 ENCOUNTER — Telehealth: Payer: Self-pay | Admitting: *Deleted

## 2024-07-20 ENCOUNTER — Inpatient Hospital Stay: Admitting: *Deleted

## 2024-07-20 DIAGNOSIS — C50412 Malignant neoplasm of upper-outer quadrant of left female breast: Secondary | ICD-10-CM

## 2024-07-20 LAB — RESEARCH LABS

## 2024-07-20 NOTE — Research (Signed)
 [employer] 2021-05 - Specimen Collection Study to Evaluate Biomarkers in Subjects with Cancer   This Nurse has reviewed this patient's inclusion and exclusion criteria as a second review and confirms [name] is eligible for study participation.  Patient may continue with enrollment.  Delon Pinal BSN RN Clinical Research Nurse [facility] Direct Dial: [phone] 07/20/2024  2:40 PM

## 2024-07-20 NOTE — Telephone Encounter (Signed)
 [employer] 2021-05 - Specimen Collection Study to Evaluate Biomarkers in Subjects with Cancer   Patient left VM stating she understood she would have to pay $250 to participate in the above study. She thought someone at [facility] told her this when introducing the study.  Called patient back and informed her that there is no charge to her or her insurance company to participate in the study. There are no direct benefits for her but the study does give participants a $50 gift card for their participation.  We discussed that it may have been Genetic counseling that informed her there may be a cost to have her Genetic testing done but it was not for this research study.  Patient verbalized understanding and agrees to participate and plans to keep her research and lab appointment this afternoon as scheduled.  Thanked patient for calling and asking questions.  Cherylyn Hoard, BSN, RN, [employer] Research Nurse II [phone] 07/20/2024 9:48 AM

## 2024-07-20 NOTE — Research (Signed)
 Trial Name:  [employer] 2021-05 - Specimen Collection Study to Evaluate Biomarkers in Subjects with Cancer    Patient [name] was identified by Dr. Lanny as a potential candidate for the above listed study.  This Clinical Research Nurse met with [name], [id number] on 07/20/24 in a manner and location that ensures patient privacy to discuss participation in the above listed research study.  Patient is Unaccompanied.  Patient was previously provided with informed consent documents.  Patient confirmed they have read the informed consent documents.  As outlined in the informed consent form, this Nurse and Leita JINNY Alert discussed the purpose of the research study, the investigational nature of the study, study procedures and requirements for study participation, potential risks and benefits of study participation, as well as alternatives to participation.  This study is not blinded or double-blinded. The patient understands participation is voluntary and they may withdraw from study participation at any time.  This study does not involve randomization.  This study does not involve an investigational drug or device. This study does not involve a placebo. Patient understands enrollment is pending full eligibility review.   Confidentiality and how the patient's information will be used as part of study participation were discussed.  Patient was informed there is reimbursement provided for their time and effort spent on trial participation.    All questions were answered to patient's satisfaction.  The informed consent with embedded HIPAA language was reviewed page by page.  The patient's mental and emotional status is appropriate to provide informed consent, and the patient verbalizes an understanding of study participation.  Patient has agreed to participate in the above listed research study and has voluntarily signed the informed consent version 06 Jul 2024 with embedded HIPAA language,  version 06 Jul 2024 on 07/20/24 at 2:25PM.  The patient was provided with a copy of the signed informed consent form with embedded HIPAA language for their reference.  No study specific procedures were obtained prior to the signing of the informed consent document.  Approximately 25 minutes were spent with the patient reviewing the informed consent documents.  Patient was not requested to complete a Release of Information form.   Eligibility: Eligibility criteria reviewed with patient. This nurse has reviewed this patient's inclusion and exclusion criteria and confirmed patient is eligible for study participation. Eligibility confirmed by treating investigator, Dr. Lanny , who also agrees that patient should proceed with enrollment.  Patient will continue with enrollment.  Data Collection: The participant was interviewed to collect the following medical history and other information below.  Patient was asked if she has a history of any of the following diagnoses.  High Blood Pressure    Yes Coronary Artery Disease                Yes Myocardial Infarction                       No Congestive Heart Failure               No Peripheral Vascular Disease          No Cerebrovascular Disease              No Chronic Pulmonary Disease             No COPD (incl Emphysema,Chronic Bronchitis)    No Lupus  No Rheumatoid Arthritis         No Rheumatoid Disease         No Does participant have a history of: Diabetes                  Yes            If yes, Type 1 or Type 2   Type 1                          If yes, was there end Organ damage?   No Has participant been diagnosed with: Dementia                         No Hemiplegia or Paraplegia No Barrett's Esophagus       No Gastric Ulcer                 No Peptic Ulcer Disease      No Mild Liver Disease           No Moderate or Severe Liver Disease  No Liver Cirrhosis                                 No Helicobacter Pylori (H.  Pylori)          No Pancreatitis                                       No Renal Disease                                No Chronic Kidney Disease (CKD)   No Ulcerative Colitis     No Crohn's Disease    No Colorectal Polyps   No Lynch Syndrome    No Hepatitis B or C     No  Is the participant currently taking a magnesium supplement?   No  Does the participant have a personal history of cancer (greater than 5 years ago)?  No  Does the participant have a family history of cancer in 1st or 2nd degree relatives? No  Does the participant have history of alcohol  consumption? Yes   If yes, current or former? Current Number of years? 55 Drinks per week? 4  Does the participant have a history of cigarette, cigar, pipe, or chewing tobacco use?  No   Blood Collection: Research blood obtained by fresh venipuncture. Patient tolerated well without any adverse events.  Gift Card: $50 gift card given to patient by Clinical Research Specialist for her participation in this study.      Patient was thanked for their participation in this study.    Cherylyn Hoard, BSN, RN, [employer] Clinical Research Nurse II [phone] 07/20/2024 3:09 PM

## 2024-07-20 NOTE — Telephone Encounter (Signed)
   Pre-operative Risk Assessment    Patient Name: [name]  DOB: 01-27-1946 MRN: [id number]   Date of last office visit: 07/08/24 Date of next office visit: Not scheduled   Request for Surgical Clearance    Procedure:  breast lumpectomy with magnetic marker localization  Date of Surgery:  Clearance 07/27/24                                Surgeon:  Dr. Vernetta Socks Group or Practice Name:  [facility] Phone number:  [phone] Fax number:  [phone]   Type of Clearance Requested:   - Medical  - Pharmacy:  Hold Clopidogrel  (Plavix ) x5 days prior   Type of Anesthesia:  General    Additional requests/questions:    Bonney Ival LOISE Gerome   07/20/2024, 10:21 AM

## 2024-07-21 ENCOUNTER — Telehealth: Payer: Self-pay

## 2024-07-21 DIAGNOSIS — Z23 Encounter for immunization: Secondary | ICD-10-CM | POA: Diagnosis not present

## 2024-07-21 NOTE — Telephone Encounter (Signed)
 Patient called to make aware that chart notes requested have been faxed to [facility].

## 2024-07-22 DIAGNOSIS — C50812 Malignant neoplasm of overlapping sites of left female breast: Secondary | ICD-10-CM | POA: Diagnosis not present

## 2024-07-22 NOTE — Telephone Encounter (Signed)
 No need to change the pump setting.  Recommend to stay on control IQ mode with Dexcom sensor to run the pump in automatic mode.

## 2024-07-22 NOTE — Telephone Encounter (Signed)
   Patient Name: [name]  DOB: Sep 12, 1946 MRN: [id number]  Primary Cardiologist: [name], MD  Chart reviewed as part of pre-operative protocol coverage. Pre-op clearance already addressed by colleagues in earlier phone notes. To summarize recommendations:  -The patient is at acceptable risk for the planned surgery. She can hold her Plavix . I would prefer that she be on ASA if they will do the surgery on ASA. If they won't then hold both. Resume Plavix  after the surgery.  -Dr. Schilling  Will route this bundled recommendation to requesting provider via Epic fax function and remove from pre-op pool. Please call with questions.  Orren LOISE Fabry, PA-C 07/22/2024, 8:48 AM

## 2024-07-24 NOTE — Anesthesia Preprocedure Evaluation (Signed)
 Anesthesia Evaluation  Patient identified by MRN, date of birth, ID band Patient awake    Reviewed: Allergy & Precautions, NPO status , Patient's Chart, lab work & pertinent test results, reviewed documented beta blocker date and time   Airway Mallampati: III  TM Distance: >3 FB Neck ROM: Full    Dental  (+) Teeth Intact, Dental Advisory Given   Pulmonary neg pulmonary ROS   Pulmonary exam normal breath sounds clear to auscultation       Cardiovascular hypertension (130/54 preop), Pt. on medications and Pt. on home beta blockers + CAD and + Cardiac Stents (plavix )  Normal cardiovascular exam+ dysrhythmias (s/p ablation) Atrial Fibrillation + Valvular Problems/Murmurs (mild-mod TR)  Rhythm:Regular Rate:Normal  Echo 06/2024 1. Left ventricular ejection fraction, by estimation, is 60 to 65%. Left  ventricular ejection fraction by 3D volume is 57 %. The left ventricle has  normal function. The left ventricle has no regional wall motion  abnormalities. Left ventricular diastolic   function could not be evaluated. The average left ventricular global  longitudinal strain is -21.4 %. The global longitudinal strain is normal.   2. Right ventricular systolic function is normal. The right ventricular  size is normal. There is mildly elevated pulmonary artery systolic  pressure.   3. Moderate pleural effusion in the left lateral region.   4. The mitral valve is normal in structure. No evidence of mitral valve  regurgitation.   5. Tricuspid valve regurgitation is mild to moderate.   6. The aortic valve is normal in structure. Aortic valve regurgitation is  not visualized.   7. The inferior vena cava is normal in size with greater than 50%  respiratory variability, suggesting right atrial pressure of 3 mmHg.     Neuro/Psych negative neurological ROS  negative psych ROS   GI/Hepatic negative GI ROS, Neg liver ROS,,,  Endo/Other   diabetes, Well Controlled, Type 2, Insulin  Dependent, Oral Hypoglycemic Agents  FS 111 preop, has pump on   Renal/GU negative Renal ROS  negative genitourinary   Musculoskeletal  (+) Arthritis , Osteoarthritis,    Abdominal   Peds  Hematology negative hematology ROS (+)   Anesthesia Other Findings   Reproductive/Obstetrics negative OB ROS                              Anesthesia Physical Anesthesia Plan  ASA: 3  Anesthesia Plan: General and Regional   Post-op Pain Management: Tylenol  PO (pre-op)* and Regional block*   Induction: Intravenous  PONV Risk Score and Plan: 3 and Ondansetron , Dexamethasone  and Treatment may vary due to age or medical condition  Airway Management Planned: LMA  Additional Equipment: None  Intra-op Plan:   Post-operative Plan: Extubation in OR  Informed Consent: I have reviewed the patients History and Physical, chart, labs and discussed the procedure including the risks, benefits and alternatives for the proposed anesthesia with the patient or authorized representative who has indicated his/her understanding and acceptance.     Dental advisory given  Plan Discussed with: CRNA  Anesthesia Plan Comments:          Anesthesia Quick Evaluation

## 2024-07-26 NOTE — H&P (Signed)
 REFERRING PHYSICIAN: Lanny Callander, MD PROVIDER: VICENTA DASIE POLI, MD MRN: [id number] DOB: 09-Mar-1946  Subjective   Chief Complaint: Breast Cancer  History of Present Illness: [name] is a 78 y.o. female who is seenas an office consultation for evaluation of Breast Cancer  this is a 78 year old female who was found to have a focal asymmetry with calcifications in the upper outer quadrant of her left breast on screening mammography. She has had no previous problem regarding her breast and her family history of breast cancer is negative. The area measures approximately 2 cm. On ultrasound it measured 2.2 cm as well. She had a contrast-enhanced mammogram of her breast which showed the mass measured almost 3.5 cm. It was at the 1 o'clock position 5 cm from the nipple. 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 It was 100% ER positive, 95% PR positive, HER2 positive, and had a Ki-67 of 3%. She has a previous history of a flutter and had an ablation as well as cardiac stent placement. She is currently on Plavix  and aspirin . She has an appointment with her cardiologist on September 4. He has no chest pain and no other issues. Has had no problem with previous general anesthesia.  Review of Systems: A complete review of systems was obtained from the patient. I have reviewed this information and discussed as appropriate with the patient. See HPI as well for other ROS.  ROS   Medical History: Past Medical History:  Diagnosis Date  Arthritis  Diabetes mellitus without complication (CMS/HHS-HCC)  Heart valve disease  History of cancer  Hyperlipidemia  Hypertension   Patient Active Problem List  Diagnosis  Acute lateral meniscal tear, right, initial encounter  Arthritis of carpometacarpal (CMC) joint of both thumbs  Asymmetrical sensorineural hearing loss  Atrial flutter (CMS/HHS-HCC)  Bilateral hearing loss  Chronic insomnia  Coronary arteriosclerosis   Type 1 diabetes mellitus with complication (CMS/HHS-HCC)  Fibroid  Hypercholesterolemia  Hypertension  Insulin  pump in place  Malignant neoplasm of upper-outer quadrant of left breast in female, estrogen receptor positive (CMS/HHS-HCC)  Mixed diabetic hyperlipidemia associated with type 1 diabetes mellitus (CMS/HHS-HCC)  Osteoarthritis  Osteopenia  Urinary incontinence  Valvular heart disease  Varicose veins of both lower extremities with pain   Past Surgical History:  Procedure Laterality Date  .Myomectomy N/A 1977  .Cardioversion N/A 08/16/2013  .Ablation N/A 09/23/2013  Atrial flutter ablation N/A 09/23/2013  Orif patella (Left) Left 04/11/2017  Laser ablation (Right) Right 11/20/2023  APPENDECTOMY N/A  date unknown  CATARACT EXTRACTION W/ INTRAOCULAR LENS IMPLANT & ANTERIOR VITRECTOMY, BILATERAL Bilateral  Unknown date  Coronary angioplasty with stent placement N/A  date unknown    Allergies  Allergen Reactions  Sulfa (Sulfonamide Antibiotics) Hives and Rash  Sulfamethoxazole-Trimethoprim Rash   Current Outpatient Medications on File Prior to Visit  Medication Sig Dispense Refill  ezetimibe  (ZETIA ) 10 mg tablet Take 10 mg by mouth once daily  gabapentin  (NEURONTIN ) 100 MG capsule Take 200 mg by mouth at bedtime  metFORMIN  (GLUCOPHAGE -XR) 500 MG XR tablet Take 500 mg by mouth as directed (TAKE 1 TABLET IN THE MORNING AND 2 TABLETS IN THE EVENING AS DIRECTED)  metoprolol  SUCCinate (TOPROL -XL) 100 MG XL tablet Take 100 mg by mouth once daily  mirabegron  (MYRBETRIQ ) 50 mg ER tablet Take 50 mg by mouth once daily  niacin  (NIASPAN ) 1000 MG ER tablet Take 1,000 mg by mouth once daily  nitroGLYcerin  (NITROSTAT ) 0.4 MG SL tablet Place 0.4 mg under the  tongue every 5 (five) minutes as needed for Chest pain DISSOLVE 1 TABLET UNDER THE TONGUE EVERY 5 MINUTES AS NEEDED FOR CHEST PAIN  pioglitazone  (ACTOS ) 15 MG tablet Take 15 mg by mouth once daily  pyridoxine, vitamin B6, (B-6)  100 MG tablet Take 100 mg by mouth once daily  rosuvastatin  (CRESTOR ) 40 MG tablet Take 40 mg by mouth once daily  solifenacin  (VESICARE ) 10 MG tablet Take 10 mg by mouth once daily   No current facility-administered medications on file prior to visit.   Family History  Problem Relation Age of Onset  High blood pressure (Hypertension) Mother  Diabetes Mother  Coronary Artery Disease (Blocked arteries around heart) Father  Coronary Artery Disease (Blocked arteries around heart) Brother    Social History   Tobacco Use  Smoking Status Never  Smokeless Tobacco Never    Social History   Socioeconomic History  Marital status: Married  Tobacco Use  Smoking status: Never  Smokeless tobacco: Never  Vaping Use  Vaping status: Never Used  Substance and Sexual Activity  Alcohol  use: Never  Drug use: Never   Social Drivers of Corporate investment banker Strain: Low Risk (01/27/2024)  Received from [facility]  Overall Financial Resource Strain (CARDIA)  Difficulty of Paying Living Expenses: Not hard at all  Food Insecurity: No Food Insecurity (01/27/2024)  Received from [facility]  Hunger Vital Sign  Within the past 12 months, you worried that your food would run out before you got the money to buy more.: Never true  Within the past 12 months, the food you bought just didn't last and you didn't have money to get more.: Never true  Transportation Needs: No Transportation Needs (01/27/2024)  Received from [facility] - Transportation  Lack of Transportation (Medical): No  Lack of Transportation (Non-Medical): No  Physical Activity: Sufficiently Active (01/27/2024)  Received from [facility]  Exercise Vital Sign  On average, how many days per week do you engage in moderate to strenuous exercise (like a brisk walk)?: 7 days  On average, how many minutes do you engage in exercise at this level?: 30 min  Stress: No Stress Concern Present (01/27/2024)  Received from [facility] - Occupational Stress Questionnaire  Feeling of Stress : Not at all  Social Connections: Socially Integrated (01/27/2024)  Received from [facility]  Social Connection and Isolation Panel  In a typical week, how many times do you talk on the phone with family, friends, or neighbors?: More than three times a week  How often do you get together with friends or relatives?: More than three times a week  How often do you attend church or religious services?: More than 4 times per year  Do you belong to any clubs or organizations such as church groups, unions, fraternal or athletic groups, or school groups?: Yes  How often do you attend meetings of the clubs or organizations you belong to?: More than 4 times per year  Are you married, widowed, divorced, separated, never married, or living with a partner?: Married   Objective:  There were no vitals filed for this visit.  There is no height or weight on file to calculate BMI.  Physical Exam   She appears well on exam  There is ecchymosis in the upper outer quadrant of the left breast from the biopsy but there are no palpable masses. There is no axillary adenopathy. The nipple areolar complex appears normal  Labs, Imaging and Diagnostic Testing: I have reviewed her mammogram, ultrasound, and pathology results.  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 I have discussed the diagnosis with the patient and her husband. From a surgical standpoint I discussed breast conservation versus mastectomy. She is currently interested in breast conservation. I next discussed proceeding with a mag seed guided left breast lumpectomy and sentinel lymph node biopsy. I explained the surgical procedure in detail. We discussed the risks which  includes but is not limited to bleeding, infection, lymphedema, injury to surrounding structures, the need for further surgery if lymph nodes or margins are positive, cardiopulmonary issues with anesthesia, etc. As she is HER2 positive, Port-A-Cath placement was also recommended. I discussed this with her as well. We discussed the risks which includes but is not limited to bleeding, infection, injury to the blood vessels, pneumothorax, port malfunction, etc. After thorough discussion, all questions answered and she agrees to proceed with surgery which will be scheduled

## 2024-07-27 ENCOUNTER — Ambulatory Visit (HOSPITAL_BASED_OUTPATIENT_CLINIC_OR_DEPARTMENT_OTHER): Payer: Self-pay | Admitting: Anesthesiology

## 2024-07-27 ENCOUNTER — Encounter (HOSPITAL_BASED_OUTPATIENT_CLINIC_OR_DEPARTMENT_OTHER)
Admission: RE | Disposition: A | Discharge status: Home / Self Care | Payer: Self-pay | Source: Home / Self Care | Attending: Surgery

## 2024-07-27 ENCOUNTER — Ambulatory Visit (HOSPITAL_COMMUNITY)

## 2024-07-27 ENCOUNTER — Ambulatory Visit (HOSPITAL_BASED_OUTPATIENT_CLINIC_OR_DEPARTMENT_OTHER)
Admission: RE | Admit: 2024-07-27 | Discharge: 2024-07-27 | Disposition: A | Discharge status: Home / Self Care | Attending: Surgery | Admitting: Surgery

## 2024-07-27 ENCOUNTER — Encounter (HOSPITAL_BASED_OUTPATIENT_CLINIC_OR_DEPARTMENT_OTHER): Payer: Self-pay | Admitting: Surgery

## 2024-07-27 ENCOUNTER — Other Ambulatory Visit: Payer: Self-pay

## 2024-07-27 DIAGNOSIS — I10 Essential (primary) hypertension: Secondary | ICD-10-CM | POA: Diagnosis not present

## 2024-07-27 DIAGNOSIS — E119 Type 2 diabetes mellitus without complications: Secondary | ICD-10-CM | POA: Insufficient documentation

## 2024-07-27 DIAGNOSIS — I4891 Unspecified atrial fibrillation: Secondary | ICD-10-CM | POA: Insufficient documentation

## 2024-07-27 DIAGNOSIS — Z7902 Long term (current) use of antithrombotics/antiplatelets: Secondary | ICD-10-CM | POA: Insufficient documentation

## 2024-07-27 DIAGNOSIS — Z79899 Other long term (current) drug therapy: Secondary | ICD-10-CM | POA: Diagnosis not present

## 2024-07-27 DIAGNOSIS — I2510 Atherosclerotic heart disease of native coronary artery without angina pectoris: Secondary | ICD-10-CM | POA: Insufficient documentation

## 2024-07-27 DIAGNOSIS — Z170 Estrogen receptor positive status [ER+]: Secondary | ICD-10-CM | POA: Insufficient documentation

## 2024-07-27 DIAGNOSIS — Z452 Encounter for adjustment and management of vascular access device: Secondary | ICD-10-CM | POA: Diagnosis not present

## 2024-07-27 DIAGNOSIS — C50912 Malignant neoplasm of unspecified site of left female breast: Secondary | ICD-10-CM

## 2024-07-27 DIAGNOSIS — Z955 Presence of coronary angioplasty implant and graft: Secondary | ICD-10-CM | POA: Insufficient documentation

## 2024-07-27 DIAGNOSIS — Z1721 Progesterone receptor positive status: Secondary | ICD-10-CM | POA: Diagnosis not present

## 2024-07-27 DIAGNOSIS — Z1731 Human epidermal growth factor receptor 2 positive status: Secondary | ICD-10-CM | POA: Insufficient documentation

## 2024-07-27 DIAGNOSIS — Z794 Long term (current) use of insulin: Secondary | ICD-10-CM | POA: Insufficient documentation

## 2024-07-27 DIAGNOSIS — G8918 Other acute postprocedural pain: Secondary | ICD-10-CM | POA: Diagnosis not present

## 2024-07-27 DIAGNOSIS — N6489 Other specified disorders of breast: Secondary | ICD-10-CM | POA: Diagnosis not present

## 2024-07-27 DIAGNOSIS — Z7984 Long term (current) use of oral hypoglycemic drugs: Secondary | ICD-10-CM | POA: Diagnosis not present

## 2024-07-27 DIAGNOSIS — D0502 Lobular carcinoma in situ of left breast: Secondary | ICD-10-CM | POA: Insufficient documentation

## 2024-07-27 DIAGNOSIS — I25119 Atherosclerotic heart disease of native coronary artery with unspecified angina pectoris: Secondary | ICD-10-CM

## 2024-07-27 DIAGNOSIS — Z01818 Encounter for other preprocedural examination: Secondary | ICD-10-CM

## 2024-07-27 DIAGNOSIS — C50412 Malignant neoplasm of upper-outer quadrant of left female breast: Secondary | ICD-10-CM | POA: Diagnosis not present

## 2024-07-27 HISTORY — PX: PORTACATH PLACEMENT: SHX2246

## 2024-07-27 HISTORY — PX: SENTINEL NODE BIOPSY: SHX6608

## 2024-07-27 LAB — GLUCOSE, CAPILLARY
Glucose-Capillary: 110 mg/dL — ABNORMAL HIGH (ref 70–99)
Glucose-Capillary: 105 mg/dL — ABNORMAL HIGH (ref 70–99)

## 2024-07-27 SURGERY — LUMPECTOMY WITH MAGNETIC MARKER LOCALIZATION
Anesthesia: Regional | Site: Neck | Laterality: Right

## 2024-07-27 MED ORDER — HEPARIN (PORCINE) IN NACL 2-0.9 UNITS/ML
INTRAMUSCULAR | Status: AC | PRN
  Administered 2024-07-27: 500 mL via INTRAVENOUS

## 2024-07-27 MED ORDER — ACETAMINOPHEN 500 MG PO TABS
1000.0000 mg | ORAL_TABLET | Freq: Once | ORAL | Status: AC
  Administered 2024-07-27: 1000 mg via ORAL

## 2024-07-27 MED ORDER — SODIUM BICARBONATE 4.2 % IV SOLN
INTRAVENOUS | Status: AC
  Filled 2024-07-27: qty 10

## 2024-07-27 MED ORDER — LIDOCAINE 2% (20 MG/ML) 5 ML SYRINGE
INTRAMUSCULAR | Status: AC
  Filled 2024-07-27: qty 5

## 2024-07-27 MED ORDER — PROPOFOL 10 MG/ML IV BOLUS
INTRAVENOUS | Status: DC | PRN
Start: 2024-07-27 — End: 2024-07-27
  Administered 2024-07-27: 200 mg via INTRAVENOUS

## 2024-07-27 MED ORDER — FENTANYL CITRATE (PF) 100 MCG/2ML IJ SOLN
INTRAMUSCULAR | Status: AC
  Filled 2024-07-27: qty 2

## 2024-07-27 MED ORDER — ROPIVACAINE HCL 5 MG/ML IJ SOLN
INTRAMUSCULAR | Status: DC | PRN
  Administered 2024-07-27: 30 mL via PERINEURAL

## 2024-07-27 MED ORDER — HEPARIN (PORCINE) IN NACL 1000-0.9 UT/500ML-% IV SOLN
INTRAVENOUS | Status: AC
  Filled 2024-07-27: qty 500

## 2024-07-27 MED ORDER — DEXAMETHASONE SODIUM PHOSPHATE 4 MG/ML IJ SOLN
INTRAMUSCULAR | Status: DC | PRN
  Administered 2024-07-27: 5 mg via INTRAVENOUS

## 2024-07-27 MED ORDER — MIDAZOLAM HCL 2 MG/2ML IJ SOLN
1.0000 mg | Freq: Once | INTRAMUSCULAR | Status: AC
Start: 2024-07-27 — End: 2024-07-27
  Administered 2024-07-27: 1 mg via INTRAVENOUS

## 2024-07-27 MED ORDER — HEPARIN SOD (PORK) LOCK FLUSH 100 UNIT/ML IV SOLN
INTRAVENOUS | Status: AC
  Filled 2024-07-27: qty 5

## 2024-07-27 MED ORDER — DEXAMETHASONE SODIUM PHOSPHATE 10 MG/ML IJ SOLN
INTRAMUSCULAR | Status: DC | PRN
  Administered 2024-07-27: 10 mg via PERINEURAL

## 2024-07-27 MED ORDER — OXYCODONE HCL 5 MG/5ML PO SOLN: 5.0000 mg | Freq: Once | ORAL | Status: DC | PRN

## 2024-07-27 MED ORDER — CHLORHEXIDINE GLUCONATE CLOTH 2 % EX PADS: 6.0000 | MEDICATED_PAD | Freq: Once | CUTANEOUS | Status: DC

## 2024-07-27 MED ORDER — ENSURE PRE-SURGERY PO LIQD: 296.0000 mL | Freq: Once | ORAL | Status: DC

## 2024-07-27 MED ORDER — EPHEDRINE 5 MG/ML INJ
INTRAVENOUS | Status: AC
  Filled 2024-07-27: qty 5

## 2024-07-27 MED ORDER — FENTANYL CITRATE (PF) 100 MCG/2ML IJ SOLN
INTRAMUSCULAR | Status: DC | PRN
  Administered 2024-07-27 (×2): 25 ug via INTRAVENOUS
  Administered 2024-07-27: 50 ug via INTRAVENOUS

## 2024-07-27 MED ORDER — BUPIVACAINE-EPINEPHRINE 0.5% -1:200000 IJ SOLN
INTRAMUSCULAR | Status: DC | PRN
  Administered 2024-07-27: 22 mL

## 2024-07-27 MED ORDER — ONDANSETRON HCL 4 MG/2ML IJ SOLN
INTRAMUSCULAR | Status: AC
  Filled 2024-07-27: qty 2

## 2024-07-27 MED ORDER — MIDAZOLAM HCL 2 MG/2ML IJ SOLN
INTRAMUSCULAR | Status: AC
  Filled 2024-07-27: qty 2

## 2024-07-27 MED ORDER — CEFAZOLIN SODIUM-DEXTROSE 2-4 GM/100ML-% IV SOLN
2.0000 g | INTRAVENOUS | Status: AC
  Administered 2024-07-27: 2 g via INTRAVENOUS

## 2024-07-27 MED ORDER — HYDRALAZINE HCL 20 MG/ML IJ SOLN
INTRAMUSCULAR | Status: AC
  Filled 2024-07-27: qty 1

## 2024-07-27 MED ORDER — FENTANYL CITRATE (PF) 100 MCG/2ML IJ SOLN
50.0000 ug | Freq: Once | INTRAMUSCULAR | Status: AC
  Administered 2024-07-27: 50 ug via INTRAVENOUS

## 2024-07-27 MED ORDER — ARTIFICIAL TEARS OPHTHALMIC OINT
TOPICAL_OINTMENT | OPHTHALMIC | Status: AC
  Filled 2024-07-27: qty 3.5

## 2024-07-27 MED ORDER — CEFAZOLIN SODIUM-DEXTROSE 2-4 GM/100ML-% IV SOLN
INTRAVENOUS | Status: AC
  Filled 2024-07-27: qty 100

## 2024-07-27 MED ORDER — PHENYLEPHRINE HCL (PRESSORS) 10 MG/ML IV SOLN
INTRAVENOUS | Status: DC | PRN
  Administered 2024-07-27: 80 ug via INTRAVENOUS
  Administered 2024-07-27: 160 ug via INTRAVENOUS
  Administered 2024-07-27 (×2): 80 ug via INTRAVENOUS

## 2024-07-27 MED ORDER — AMISULPRIDE (ANTIEMETIC) 5 MG/2ML IV SOLN: 10.0000 mg | Freq: Once | INTRAVENOUS | Status: DC | PRN

## 2024-07-27 MED ORDER — BUPIVACAINE-EPINEPHRINE (PF) 0.5% -1:200000 IJ SOLN
INTRAMUSCULAR | Status: AC
  Filled 2024-07-27: qty 30

## 2024-07-27 MED ORDER — ATROPINE SULFATE 0.4 MG/ML IV SOLN
INTRAVENOUS | Status: AC
  Filled 2024-07-27: qty 1

## 2024-07-27 MED ORDER — LIDOCAINE HCL (CARDIAC) PF 100 MG/5ML IV SOSY
PREFILLED_SYRINGE | INTRAVENOUS | Status: DC | PRN
  Administered 2024-07-27: 60 mg via INTRAVENOUS

## 2024-07-27 MED ORDER — ACETAMINOPHEN 500 MG PO TABS
ORAL_TABLET | ORAL | Status: AC
  Filled 2024-07-27: qty 2

## 2024-07-27 MED ORDER — DEXMEDETOMIDINE HCL IN NACL 80 MCG/20ML IV SOLN
INTRAVENOUS | Status: AC
  Filled 2024-07-27: qty 20

## 2024-07-27 MED ORDER — TRAMADOL HCL 50 MG PO TABS: 50.0000 mg | ORAL_TABLET | Freq: Four times a day (QID) | ORAL | 0 refills | Status: AC | PRN

## 2024-07-27 MED ORDER — OXYCODONE HCL 5 MG PO TABS: 5.0000 mg | ORAL_TABLET | Freq: Once | ORAL | Status: DC | PRN

## 2024-07-27 MED ORDER — KETOROLAC TROMETHAMINE 30 MG/ML IJ SOLN
INTRAMUSCULAR | Status: AC
  Filled 2024-07-27: qty 1

## 2024-07-27 MED ORDER — ACETAMINOPHEN 500 MG PO TABS
1000.0000 mg | ORAL_TABLET | ORAL | Status: AC
Start: 2024-07-27 — End: 2024-07-27

## 2024-07-27 MED ORDER — LACTATED RINGERS IV SOLN: INTRAVENOUS | Status: DC

## 2024-07-27 MED ORDER — HYDROMORPHONE HCL 1 MG/ML IJ SOLN: 0.2500 mg | INTRAMUSCULAR | Status: DC | PRN

## 2024-07-27 MED ORDER — MAGTRACE LYMPHATIC TRACER
INTRAMUSCULAR | Status: DC | PRN
  Administered 2024-07-27: 2 mL via INTRAMUSCULAR

## 2024-07-27 MED ORDER — LIDOCAINE HCL (PF) 1 % IJ SOLN
INTRAMUSCULAR | Status: AC
  Filled 2024-07-27: qty 30

## 2024-07-27 MED ORDER — HEPARIN SOD (PORK) LOCK FLUSH 100 UNIT/ML IV SOLN
INTRAVENOUS | Status: DC | PRN
  Administered 2024-07-27: 500 [IU] via INTRAVENOUS

## 2024-07-27 MED ORDER — ONDANSETRON HCL 4 MG/2ML IJ SOLN
INTRAMUSCULAR | Status: DC | PRN
Start: 2024-07-27 — End: 2024-07-27
  Administered 2024-07-27: 4 mg via INTRAVENOUS

## 2024-07-27 MED ORDER — DEXAMETHASONE SODIUM PHOSPHATE 10 MG/ML IJ SOLN
INTRAMUSCULAR | Status: AC
  Filled 2024-07-27: qty 1

## 2024-07-27 MED ORDER — ONDANSETRON HCL 4 MG/2ML IJ SOLN: 4.0000 mg | Freq: Once | INTRAMUSCULAR | Status: DC | PRN

## 2024-07-27 SURGICAL SUPPLY — 48 items
BAG DECANTER FOR FLEXI CONT (MISCELLANEOUS) ×4 IMPLANT
BINDER BREAST 3XL (GAUZE/BANDAGES/DRESSINGS) IMPLANT
BINDER BREAST LRG (GAUZE/BANDAGES/DRESSINGS) IMPLANT
BINDER BREAST MEDIUM (GAUZE/BANDAGES/DRESSINGS) IMPLANT
BINDER BREAST XLRG (GAUZE/BANDAGES/DRESSINGS) IMPLANT
BINDER BREAST XXLRG (GAUZE/BANDAGES/DRESSINGS) IMPLANT
BLADE SURG 11 STRL SS (BLADE) IMPLANT
BLADE SURG 15 STRL LF DISP TIS (BLADE) ×4 IMPLANT
CANISTER SUC SOCK COL 7IN (MISCELLANEOUS) IMPLANT
CANISTER SUCT 1200ML W/VALVE (MISCELLANEOUS) IMPLANT
CHLORAPREP W/TINT 26 (MISCELLANEOUS) ×4 IMPLANT
CLIP APPLIE 9.375 MED OPEN (MISCELLANEOUS) IMPLANT
COVER BACK TABLE 60X90IN (DRAPES) ×4 IMPLANT
COVER MAYO STAND STRL (DRAPES) ×4 IMPLANT
COVER PROBE W GEL 5X96 (DRAPES) ×4 IMPLANT
DERMABOND ADVANCED .7 DNX12 (GAUZE/BANDAGES/DRESSINGS) ×8 IMPLANT
DRAPE C-ARM 42X72 X-RAY (DRAPES) ×4 IMPLANT
DRAPE LAPAROSCOPIC ABDOMINAL (DRAPES) ×4 IMPLANT
DRAPE UTILITY XL STRL (DRAPES) ×4 IMPLANT
ELECTRODE REM PT RTRN 9FT ADLT (ELECTROSURGICAL) ×4 IMPLANT
GAUZE 4X4 16PLY ~~LOC~~+RFID DBL (SPONGE) ×4 IMPLANT
GAUZE SPONGE 4X4 12PLY STRL LF (GAUZE/BANDAGES/DRESSINGS) IMPLANT
GLOVE BIOGEL PI IND STRL 7.0 (GLOVE) IMPLANT
GLOVE SURG SIGNA 7.5 PF LTX (GLOVE) ×4 IMPLANT
GLOVE SURG SS PI 6.5 STRL IVOR (GLOVE) IMPLANT
GOWN STRL REUS W/ TWL LRG LVL3 (GOWN DISPOSABLE) ×4 IMPLANT
GOWN STRL REUS W/ TWL XL LVL3 (GOWN DISPOSABLE) ×4 IMPLANT
KIT CVR 48X5XPRB PLUP LF (MISCELLANEOUS) IMPLANT
KIT MARKER MARGIN INK (KITS) ×4 IMPLANT
KIT PORT POWER 8FR ISP CVUE (Port) IMPLANT
NDL HYPO 25X1 1.5 SAFETY (NEEDLE) ×4 IMPLANT
NEEDLE HYPO 25X1 1.5 SAFETY (NEEDLE) ×6 IMPLANT
NS IRRIG 1000ML POUR BTL (IV SOLUTION) IMPLANT
PACK BASIN DAY SURGERY FS (CUSTOM PROCEDURE TRAY) ×4 IMPLANT
PENCIL SMOKE EVACUATOR (MISCELLANEOUS) ×4 IMPLANT
SLEEVE SCD COMPRESS KNEE MED (STOCKING) ×4 IMPLANT
SPIKE FLUID TRANSFER (MISCELLANEOUS) IMPLANT
SPONGE T-LAP 4X18 ~~LOC~~+RFID (SPONGE) ×4 IMPLANT
SUT MNCRL AB 4-0 PS2 18 (SUTURE) ×4 IMPLANT
SUT PROLENE 2 0 SH DA (SUTURE) ×4 IMPLANT
SUT SILK 2 0 SH (SUTURE) IMPLANT
SUT SILK 2 0 TIES 17X18 (SUTURE) IMPLANT
SUT VIC AB 3-0 SH 27X BRD (SUTURE) ×4 IMPLANT
SYR CONTROL 10ML LL (SYRINGE) ×4 IMPLANT
TOWEL GREEN STERILE FF (TOWEL DISPOSABLE) ×4 IMPLANT
TRAY FAXITRON CT DISP (TRAY / TRAY PROCEDURE) ×4 IMPLANT
TUBE CONNECTING 20X1/4 (TUBING) IMPLANT
YANKAUER SUCT BULB TIP NO VENT (SUCTIONS) IMPLANT

## 2024-07-27 NOTE — Anesthesia Postprocedure Evaluation (Signed)
 Anesthesia Post Note  Patient: [name]  Procedure(s) Performed: LUMPECTOMY WITH MAGNETIC MARKER LOCALIZATION (Left: Breast) LEFT SENTINEL LYMPH NODE BIOPSY (Left: Axilla) INSERTION OF TUNNELED CENTRAL VENOUS DEVICE WITH PORT (Right: Neck)     Patient location during evaluation: PACU Anesthesia Type: Regional and General Level of consciousness: awake and alert, oriented and patient cooperative Pain management: pain level controlled Vital Signs Assessment: post-procedure vital signs reviewed and stable Respiratory status: spontaneous breathing, nonlabored ventilation and respiratory function stable Cardiovascular status: blood pressure returned to baseline and stable Postop Assessment: no apparent nausea or vomiting Anesthetic complications: no   No notable events documented.  Last Vitals:  Vitals:   07/27/24 1400 07/27/24 1415  BP: 125/61 (!) 126/59  Pulse: 64 63  Resp: 11 17  Temp:    SpO2: 100% 100%    Last Pain:  Vitals:   07/27/24 1415  TempSrc:   PainSc: 2                  [name]

## 2024-07-27 NOTE — Transfer of Care (Signed)
 Immediate Anesthesia Transfer of Care Note  Patient: [name]  Procedure(s) Performed: LUMPECTOMY WITH MAGNETIC MARKER LOCALIZATION (Left: Breast) LEFT SENTINEL LYMPH NODE BIOPSY (Left: Axilla) INSERTION OF TUNNELED CENTRAL VENOUS DEVICE WITH PORT (Right: Neck)  Patient Location: PACU  Anesthesia Type:GA combined with regional for post-op pain  Level of Consciousness: awake, alert , oriented, and patient cooperative  Airway & Oxygen Therapy: Patient Spontanous Breathing and Patient connected to face mask oxygen  Post-op Assessment: Report given to RN, Post -op Vital signs reviewed and stable, and Patient moving all extremities X 4  Post vital signs: Reviewed and stable  Last Vitals:  Vitals Value Taken Time  BP 144/65 07/27/24 13:47  Temp    Pulse 64 07/27/24 13:51  Resp 14 07/27/24 13:51  SpO2 100 % 07/27/24 13:51  Vitals shown include unfiled device data.  Last Pain:  Vitals:   07/27/24 1120  TempSrc: Temporal  PainSc: 0-No pain      Patients Stated Pain Goal: 2 (07/27/24 1120)  Complications: No notable events documented.

## 2024-07-27 NOTE — Anesthesia Procedure Notes (Addendum)
 Procedure Name: LMA Insertion Date/Time: 07/27/2024 11:53 AM  Performed by: Denton Niels CROME, CRNAPre-anesthesia Checklist: Patient identified, Emergency Drugs available, Suction available, Patient being monitored and Timeout performed Patient Re-evaluated:Patient Re-evaluated prior to induction Oxygen Delivery Method: Circle system utilized Preoxygenation: Pre-oxygenation with 100% oxygen Induction Type: IV induction Ventilation: Mask ventilation without difficulty LMA: LMA inserted LMA Size: 4.0 Placement Confirmation: positive ETCO2 Tube secured with: Tape Dental Injury: Teeth and Oropharynx as per pre-operative assessment

## 2024-07-27 NOTE — Op Note (Signed)
 [name] 07/27/2024   Pre-op Diagnosis: LEFT BREAST CANCER     Post-op Diagnosis: same  Procedure(s): MAG SEED GUIDED LEFT BREAST LUMPECTOMY LEFT DEEP AXILLARY SENTINEL LYMPH NODE BIOPSY RIGHT INTERNAL JUGULAR PORT-A-CATH INSERTION (8 fr) INJECTION OF MAG TRACE FOR LYMPH NODE MAPPING  Surgeon(s): Vernetta Berg, MD  Anesthesia: General  Staff:  Circulator: Elaine Avelina JINNY, RN Radiology Technologist: Jackquline Poe P Relief Circulator: Pleas Annabella HERO, RN Scrub Person: Milford Asberry CROME  Estimated Blood Loss: Minimal               Specimens: sent to path  Indications: This is a 78 year old female was found to have an asymmetry with calcifications in the upper outer quadrant of the left breast.  The area measured approximately 2 cm but on contrast-enhanced mammogram measured as large as 3.5 cm.  It was at the 1 o'clock position 5 cm from the nipple.  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  The decision was made to proceed with a lumpectomy with sentinel lymph node biopsy as well as Port-A-Cath insertion  Procedure: The patient was brought to the operating room and identified as correct patient.  She was placed upon the operating table and general anesthesia was induced.  Under sterile technique I then injected 2 cc of mag trace underneath the left nipple areolar complex and the size of breast.  Her arms were then tucked.  Her right chest and neck were then prepped and draped in usual sterile fashion.  Under ultrasound guidance identified the right internal jugular vein.  I was able to easily cannulate the vein with a introducer needle and then passed the guidewire through the needle into the central venous system under fluoroscopy.  An 8 French port was brought to the field and flushed appropriately.  I created a pocket for the port on the upper chest after making incision with a scalpel on the right side.  I also used a  scalpel to make an incision at the wire introduction site on the neck.  I then passed the introducer sheath and venous dilator over the wire and into the central venous system.  I then removed the wire and dilator.  The port fit easily into the pocket.  I then used a tunneling device to tunnel from the port site to the neck and then pulled the catheter from the neck incision down to the port site.  I attached the catheter to the port which both been flushed.  I then cut the catheter appropriate length distally.  The catheter was then fed down the peel-away sheath as the sheath was slowly peeled away leaving the catheter and central venous system.  Under fluoroscopy it appeared to be in the superior vena cava near the right atrium.  The port was accessed and good flush and return redemonstrated.  I then instilled with concentrated heparin  solution.  The port was then sewn to the chest wall with a single interrupted 3-0 Prolene suture.  I then closed both incisions with interrupted 3-0 Vicryl sutures and running 4-0 Monocryl sutures.  Dermabond was then applied.  We then removed all the drapes.  Her arms were then placed back out.  Her left breast and axilla were then prepped and draped in usual sterile fashion.  Using the magnetic probe I then identified the Magseed in the upper outer quadrant of the left breast approximately 5 cm from the nipple areolar complex.  I anesthetized the  edge of the areola with Marcaine  and then made a circumareolar incision with a scalpel.  I then dissected laterally toward the signal from the probe.  I then dissected past the signal and then down to the chest wall.  I then performed a wide lumpectomy with the aid of the probe attended stay widely around the signal from the mag seed.  I then completed the lumpectomy removing the breast tissue with the seed.  I marked all margins with paint.  An x-ray was performed on the specimen confirming that the [facility] and previous biopsy clip were  in the center of the specimen.  The lumpectomy specimen was then sent to pathology for evaluation.   Next I anesthetized the left axilla with Marcaine  and made a longitudinal incision with a scalpel.  With the aid of the cautery I then dissected down into the deep left axilla.  Again with the aid of the mag trace probe I identified at least 3 sentinel lymph nodes which I excised with the cautery and surgical clips and sent to pathology for evaluation.  After these nodes were removed there was no further uptake of mag trace and no palpable lymph nodes were identified.  I anesthetized the lumpectomy cavity further with Marcaine .  Hemostased appear to be achieved.  I placed surgical clips around the periphery of the lumpectomy cavity.  I then closed both the breast incision and axillary incision with interrupted 3-0 Vicryl sutures and running 4-0 Monocryl sutures.  Dermabond was then applied to these incisions.  The patient tolerated the procedure well.  All the counts were correct at the end of the procedure.  The patient was then placed in a breast binder.  She was then extubated in the operating room and taken in a stable condition to the recovery room.          Vicenta Poli   Date: 07/27/2024  Time: 1:40 PM

## 2024-07-27 NOTE — Interval H&P Note (Signed)
 History and Physical Interval Note:no change in H and P  07/27/2024 11:20 AM  [name]  has presented today for surgery, with the diagnosis of LEFT BREAST CANCER.  The various methods of treatment have been discussed with the patient and family. After consideration of risks, benefits and other options for treatment, the patient has consented to  Procedure(s) with comments: LUMPECTOMY WITH MAGNETIC MARKER LOCALIZATION (Left) BIOPSY, LYMPH NODE, SENTINEL (Left) - SENTINEL NODE BIOPSY INSERTION, TUNNELED CENTRAL VENOUS DEVICE, WITH PORT (N/A) - PORT-A-CATH INSERTION WITH ULTRASOUND GUIDANCE as a surgical intervention.  The patient's history has been reviewed, patient examined, no change in status, stable for surgery.  I have reviewed the patient's chart and labs.  Questions were answered to the patient's satisfaction.     Vicenta Poli

## 2024-07-27 NOTE — Progress Notes (Signed)
Assisted Dr. Doroteo Glassman with left, pectoralis, ultrasound guided block. Side rails up, monitors on throughout procedure. See vital signs in flow sheet. Tolerated Procedure well.

## 2024-07-27 NOTE — Discharge Instructions (Addendum)
 [facility] Office Phone Number [phone]  BREAST BIOPSY/ PARTIAL MASTECTOMY: POST OP INSTRUCTIONS  Always review your discharge instruction sheet given to you by the facility where your surgery was performed.  IF YOU HAVE DISABILITY OR FAMILY LEAVE FORMS, YOU MUST BRING THEM TO THE OFFICE FOR PROCESSING.  DO NOT GIVE THEM TO YOUR DOCTOR.  A prescription for pain medication may be given to you upon discharge.  Take your pain medication as prescribed, if needed.  If narcotic pain medicine is not needed, then you may take acetaminophen  (Tylenol ) or ibuprofen (Advil) as needed. Take your usually prescribed medications unless otherwise directed If you need a refill on your pain medication, please contact your pharmacy.  They will contact our office to request authorization.  Prescriptions will not be filled after 5pm or on week-ends. You should eat very light the first 24 hours after surgery, such as soup, crackers, pudding, etc.  Resume your normal diet the day after surgery. Most patients will experience some swelling and bruising in the breast.  Ice packs and a good support bra will help.  Swelling and bruising can take several days to resolve.  It is common to experience some constipation if taking pain medication after surgery.  Increasing fluid intake and taking a stool softener will usually help or prevent this problem from occurring.  A mild laxative (Milk of Magnesia or Miralax ) should be taken according to package directions if there are no bowel movements after 48 hours. Unless discharge instructions indicate otherwise, you may remove your bandages 24-48 hours after surgery, and you may shower at that time.  You may have steri-strips (small skin tapes) in place directly over the incision.  These strips should be left on the skin for 7-10 days.  If your surgeon used skin glue on the incision, you may shower in 24 hours.  The glue will flake off over the next 2-3 weeks.  Any  sutures or staples will be removed at the office during your follow-up visit. ACTIVITIES:  You may resume regular daily activities (gradually increasing) beginning the next day.  Wearing a good support bra or sports bra minimizes pain and swelling.  You may have sexual intercourse when it is comfortable. You may drive when you no longer are taking prescription pain medication, you can comfortably wear a seatbelt, and you can safely maneuver your car and apply brakes. RETURN TO WORK:  ______________________________________________________________________________________ [name] should see your doctor in the office for a follow-up appointment approximately two weeks after your surgery.  Your doctor's nurse will typically make your follow-up appointment when she calls you with your pathology report.  Expect your pathology report 2-3 business days after your surgery.  You may call to check if you do not hear from us  after three days. OTHER INSTRUCTIONS: YOU MAY REMOVE THE BINDER AND SHOWER TOMORROW AND THEN PUT THE BINDER BACK ON ICE PACK, TYLENOL , AND IBUPROFEN ALSO FOR PAIN NO VIGOROUS ACTIVITY FOR ONE WEEK _______________________________________________________________________________________________ _____________________________________________________________________________________________________________________________________ _____________________________________________________________________________________________________________________________________ _____________________________________________________________________________________________________________________________________  WHEN TO CALL YOUR DOCTOR: Fever over 101.0 Nausea and/or vomiting. Extreme swelling or bruising. Continued bleeding from incision. Increased pain, redness, or drainage from the incision.  The clinic staff is available to answer your questions during regular business hours.  Please don't hesitate to call and ask  to speak to one of the nurses for clinical concerns.  If you have a medical emergency, go to the nearest emergency room or call 911.  A surgeon from [facility] is always on  call at the hospital.  For further questions, please visit [email or web address]      Post Anesthesia Home Care Instructions  Activity: Get plenty of rest for the remainder of the day. A responsible individual must stay with you for 24 hours following the procedure.  For the next 24 hours, DO NOT: -Drive a car -Advertising copywriter -Drink alcoholic beverages -Take any medication unless instructed by your physician -Make any legal decisions or sign important papers.  Meals: Start with liquid foods such as gelatin or soup. Progress to regular foods as tolerated. Avoid greasy, spicy, heavy foods. If nausea and/or vomiting occur, drink only clear liquids until the nausea and/or vomiting subsides. Call your physician if vomiting continues.  Special Instructions/Symptoms: Your throat may feel dry or sore from the anesthesia or the breathing tube placed in your throat during surgery. If this causes discomfort, gargle with warm salt water. The discomfort should disappear within 24 hours.  If you had a scopolamine  patch placed behind your ear for the management of post- operative nausea and/or vomiting:  1. The medication in the patch is effective for 72 hours, after which it should be removed.  Wrap patch in a tissue and discard in the trash. Wash hands thoroughly with soap and water. 2. You may remove the patch earlier than 72 hours if you experience unpleasant side effects which may include dry mouth, dizziness or visual disturbances. 3. Avoid touching the patch. Wash your hands with soap and water after contact with the patch.     Next dose of tylenol  may be taken at 6p if needed

## 2024-07-27 NOTE — Anesthesia Procedure Notes (Signed)
 Anesthesia Regional Block: Pectoralis block   Pre-Anesthetic Checklist: , timeout performed,  Correct Patient, Correct Site, Correct Laterality,  Correct Procedure, Correct Position, site marked,  Risks and benefits discussed,  Surgical consent,  Pre-op evaluation,  At surgeon's request and post-op pain management  Laterality: Left  Prep: Maximum Sterile Barrier Precautions used, chloraprep       Needles:  Injection technique: Single-shot  Needle Type: Echogenic Stimulator Needle     Needle Length: 9cm  Needle Gauge: 22     Additional Needles:   Procedures:,,,, ultrasound used (permanent image in chart),,    Narrative:  Start time: 07/27/2024 11:30 AM End time: 07/27/2024 11:35 AM Injection made incrementally with aspirations every 5 mL.  Performed by: Personally  Anesthesiologist: Merla Almarie HERO, DO  Additional Notes: Monitors applied. No increased pain on injection. No increased resistance to injection. Injection made in 5cc increments. Good needle visualization. Patient tolerated procedure well.

## 2024-07-28 ENCOUNTER — Other Ambulatory Visit: Payer: Self-pay

## 2024-07-28 ENCOUNTER — Encounter (HOSPITAL_BASED_OUTPATIENT_CLINIC_OR_DEPARTMENT_OTHER): Payer: Self-pay | Admitting: Surgery

## 2024-07-28 ENCOUNTER — Encounter: Payer: Self-pay | Admitting: Family Medicine

## 2024-07-28 ENCOUNTER — Telehealth: Payer: Self-pay

## 2024-07-28 DIAGNOSIS — E109 Type 1 diabetes mellitus without complications: Secondary | ICD-10-CM

## 2024-07-28 MED ORDER — GABAPENTIN 100 MG PO CAPS: 200.0000 mg | ORAL_CAPSULE | Freq: Every day | ORAL | 0 refills | Status: AC

## 2024-07-28 NOTE — Telephone Encounter (Signed)
 Patient called stating [facility] sent her a message they were missing RX for her pump supplies, however this was submitted on [employer] approximately a week ago.  Per patient she was willing to pay out of pocket until everything gets handled regarding what was needed.  After researching in Parachute the RX was sent they were requesting notes that stated patient had C-peptide and Fasting blood glucose done on same day. When looked back at patient's chart it was noted the C-peptide was done in 2017 but no fasting glucose. RN placed lab orders for these to be drawn together and transferred patient to front desk to schedule labs  Written RX from [place] was also received and faxed back with patient's chart notes

## 2024-07-29 ENCOUNTER — Other Ambulatory Visit

## 2024-07-29 DIAGNOSIS — E109 Type 1 diabetes mellitus without complications: Secondary | ICD-10-CM | POA: Diagnosis not present

## 2024-07-30 ENCOUNTER — Ambulatory Visit: Payer: Self-pay | Admitting: Endocrinology

## 2024-07-30 LAB — BASIC METABOLIC PANEL WITHOUT GFR
BUN: 17 mg/dL (ref 7–25)
CO2: 27 mmol/L (ref 20–32)
Calcium: 9.1 mg/dL (ref 8.6–10.4)
Chloride: 107 mmol/L (ref 98–110)
Creat: 0.68 mg/dL (ref 0.60–1.00)
Glucose, Bld: 83 mg/dL (ref 65–99)
Potassium: 4.6 mmol/L (ref 3.5–5.3)
Sodium: 142 mmol/L (ref 135–146)

## 2024-07-30 LAB — HEMOGLOBIN A1C
Hgb A1c MFr Bld: 7.1 % — ABNORMAL HIGH (ref <5.7)
Mean Plasma Glucose: 157 mg/dL
eAG (mmol/L): 8.7 mmol/L

## 2024-07-30 LAB — SURGICAL PATHOLOGY

## 2024-07-30 LAB — MICROALBUMIN / CREATININE URINE RATIO
Creatinine, Urine: 97 mg/dL (ref 20–275)
Microalb, Ur: <0.2 mg/dL

## 2024-07-30 LAB — C-PEPTIDE: C-Peptide: 0.21 ng/mL — ABNORMAL LOW (ref 0.80–3.85)

## 2024-08-02 ENCOUNTER — Telehealth: Payer: Self-pay

## 2024-08-02 NOTE — Telephone Encounter (Signed)
 Pump supply order corrected via parachute.

## 2024-08-05 ENCOUNTER — Encounter: Payer: Self-pay | Admitting: *Deleted

## 2024-08-08 NOTE — Assessment & Plan Note (Signed)
 pT2N0M0, stage IA,G2 invasive lobular carcinoma, triple positive -Discovered during screening mammogram. 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  4 sentinel lymph nodes were negative.  Margins were negative for invasive carcinoma, but positive for LCIS. -I recommend adjuvant chemotherapy and anti-HER2 therapy.

## 2024-08-09 ENCOUNTER — Other Ambulatory Visit

## 2024-08-09 ENCOUNTER — Telehealth: Payer: Self-pay | Admitting: Hematology

## 2024-08-09 ENCOUNTER — Inpatient Hospital Stay: Attending: Hematology | Admitting: Hematology

## 2024-08-09 ENCOUNTER — Telehealth: Payer: Self-pay

## 2024-08-09 ENCOUNTER — Other Ambulatory Visit: Payer: Self-pay

## 2024-08-09 VITALS — BP 132/64 | HR 75 | Temp 97.9°F | Resp 15 | Ht 69.0 in | Wt 195.3 lb

## 2024-08-09 DIAGNOSIS — M1990 Unspecified osteoarthritis, unspecified site: Secondary | ICD-10-CM | POA: Diagnosis not present

## 2024-08-09 DIAGNOSIS — Z5189 Encounter for other specified aftercare: Secondary | ICD-10-CM | POA: Diagnosis not present

## 2024-08-09 DIAGNOSIS — E109 Type 1 diabetes mellitus without complications: Secondary | ICD-10-CM | POA: Diagnosis not present

## 2024-08-09 DIAGNOSIS — Z1721 Progesterone receptor positive status: Secondary | ICD-10-CM | POA: Insufficient documentation

## 2024-08-09 DIAGNOSIS — E785 Hyperlipidemia, unspecified: Secondary | ICD-10-CM | POA: Diagnosis not present

## 2024-08-09 DIAGNOSIS — Z5111 Encounter for antineoplastic chemotherapy: Secondary | ICD-10-CM | POA: Diagnosis not present

## 2024-08-09 DIAGNOSIS — M8580 Other specified disorders of bone density and structure, unspecified site: Secondary | ICD-10-CM | POA: Diagnosis not present

## 2024-08-09 DIAGNOSIS — T380X5A Adverse effect of glucocorticoids and synthetic analogues, initial encounter: Secondary | ICD-10-CM | POA: Diagnosis not present

## 2024-08-09 DIAGNOSIS — Z794 Long term (current) use of insulin: Secondary | ICD-10-CM | POA: Diagnosis not present

## 2024-08-09 DIAGNOSIS — M898X9 Other specified disorders of bone, unspecified site: Secondary | ICD-10-CM | POA: Diagnosis not present

## 2024-08-09 DIAGNOSIS — Z170 Estrogen receptor positive status [ER+]: Secondary | ICD-10-CM | POA: Diagnosis not present

## 2024-08-09 DIAGNOSIS — Z9221 Personal history of antineoplastic chemotherapy: Secondary | ICD-10-CM | POA: Insufficient documentation

## 2024-08-09 DIAGNOSIS — Z79899 Other long term (current) drug therapy: Secondary | ICD-10-CM | POA: Insufficient documentation

## 2024-08-09 DIAGNOSIS — C50412 Malignant neoplasm of upper-outer quadrant of left female breast: Secondary | ICD-10-CM | POA: Diagnosis not present

## 2024-08-09 DIAGNOSIS — R5383 Other fatigue: Secondary | ICD-10-CM | POA: Diagnosis not present

## 2024-08-09 DIAGNOSIS — R110 Nausea: Secondary | ICD-10-CM | POA: Diagnosis not present

## 2024-08-09 DIAGNOSIS — Z7984 Long term (current) use of oral hypoglycemic drugs: Secondary | ICD-10-CM | POA: Insufficient documentation

## 2024-08-09 DIAGNOSIS — Z1731 Human epidermal growth factor receptor 2 positive status: Secondary | ICD-10-CM | POA: Insufficient documentation

## 2024-08-09 DIAGNOSIS — I119 Hypertensive heart disease without heart failure: Secondary | ICD-10-CM | POA: Insufficient documentation

## 2024-08-09 DIAGNOSIS — R197 Diarrhea, unspecified: Secondary | ICD-10-CM | POA: Diagnosis not present

## 2024-08-09 DIAGNOSIS — Z7952 Long term (current) use of systemic steroids: Secondary | ICD-10-CM | POA: Insufficient documentation

## 2024-08-09 MED ORDER — PROCHLORPERAZINE MALEATE 10 MG PO TABS: 10.0000 mg | ORAL_TABLET | Freq: Four times a day (QID) | ORAL | 1 refills | Status: AC | PRN

## 2024-08-09 MED ORDER — LIDOCAINE-PRILOCAINE 2.5-2.5 % EX CREA: TOPICAL_CREAM | CUTANEOUS | 3 refills | Status: AC

## 2024-08-09 MED ORDER — ONDANSETRON HCL 8 MG PO TABS
8.0000 mg | ORAL_TABLET | Freq: Three times a day (TID) | ORAL | 1 refills | Status: AC | PRN
Start: 2024-08-09 — End: ?

## 2024-08-09 MED ORDER — DEXAMETHASONE 4 MG PO TABS: ORAL_TABLET | ORAL | 1 refills | Status: AC

## 2024-08-09 NOTE — Telephone Encounter (Signed)
 [name] is all set and aware of her de-coupled appointments scheduled for 10/21 and 10/22.

## 2024-08-09 NOTE — Telephone Encounter (Signed)
 Received voicemail from the patient inquiring how she is supposed to pay for the Dignicap. Patient stated she comes in 10/21 for [facility] w/ Lab and follow-up office visit w/ Dr. Lanny and is scheduled to start chemo, 10/22.

## 2024-08-09 NOTE — Progress Notes (Signed)
 [facility]   [phone] [phone]   Clinic Follow up Note   Patient Care Team: Rollene Almarie LABOR, MD as PCP - General (Internal Medicine) Lavona Agent, MD as PCP - Cardiology (Cardiology) [name], Aleene PARAS, MD (Inactive) (Cardiology) Dwane Lerner, MD (Dermatology) Waddell Danelle ORN, MD (Cardiology) Obie Princella HERO, MD (Inactive) (Gastroenterology) Claudene Arthea HERO, DO (Sports Medicine) [place], Heather, OD as Consulting Physician (Optometry) Szabat, Toribio BROCKS, [facility] (Inactive) (Pharmacist) [name], Pasco RAMAN, DPM as Consulting Physician (Podiatry) Thapa, [place], MD as Consulting Physician (Endocrinology) Gerome, Devere HERO, RN as Oncology Nurse Navigator Tyree Nanetta SAILOR, RN as Oncology Nurse Navigator Vernetta Berg, MD as Consulting Physician (General Surgery) Lanny Callander, MD as Consulting Physician (Hematology) Dewey Rush, MD as Consulting Physician (Radiation Oncology)  Date of Service:  08/09/2024  CHIEF COMPLAINT: f/u of breast cancer  CURRENT THERAPY:  Pending adjuvant chemotherapy [facility]  Oncology History   Malignant neoplasm of upper-outer quadrant of left breast in female, estrogen receptor positive (HCC) pT2N0M0, stage IA,G2 invasive lobular carcinoma, triple positive -Discovered during screening mammogram. 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  4 sentinel lymph nodes were negative.  Margins were negative for invasive carcinoma, but positive for LCIS. -I recommend adjuvant chemotherapy and anti-HER2 therapy.  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 Tumor is grade 2. Considering mastectomy due to future cancer risk and  potential misshapenness from further surgery. TCH (docetaxel, carboplatin, trastuzumab) recommended due to tumor size. Aware of potential side effects including diarrhea, fatigue, and increased infection risk. Considering cold capping to reduce hair loss during chemotherapy. Mastectomy may eliminate the need for radiation unless more cancer cells are found. TCH regimen involves higher doses every three weeks, with potential for more side effects compared to weekly paclitaxel and trastuzumab. Cold capping has a 60-70% success rate in reducing visible hair loss. -Pt wants breast MRI of the right breast to assess for any new breast cancer in right breast before surgery. - Schedule chemotherapy class to educate on treatment regimen and side effects. - Administer TCH chemotherapy regimen with premedication including steroids to manage side effects. - Monitor blood glucose levels closely due to steroid use and adjust insulin  as needed. - Provide anti-nausea medications (ondansetron , prochlorperazine) and lidocaine  cream for port site. - Arrange for growth factor injections post-chemotherapy to support white blood cell count. - Discuss cold capping options with her and provide information on the process and costs. - Coordinate with Dr. Vernetta regarding surgical plans and potential mastectomy. - Advise on infection prevention measures, including mask-wearing in public and flu/COVID vaccinations.  Type 1 diabetes mellitus Diabetes managed with an insulin  pump and continuous glucose monitor. Last HbA1c was 7.5%, which her new endocrinologist aims to lower. Steroids during chemotherapy may increase blood glucose levels, requiring adjustments in insulin  dosing. - Monitor blood glucose levels closely, especially during chemotherapy treatment. - Consult with endocrinologist to adjust insulin  regimen as needed. - Continue using continuous glucose monitor and insulin  pump for diabetes management.  Plan - I  reviewed her surgical pathology findings with patient and her husband, and recommend adjuvant chemotherapy TCH, patient agrees. - To start chemotherapy on October 20, will arrange chemo class.  Her baseline echocardiogram from August 2025 was normal -  She will follow-up with surgeon Dr. Vernetta later this week, she will likely have mastectomy to clear the LCIS margin after adjuvant chemo - Follow-up with for cycle chemo.   SUMMARY OF ONCOLOGIC HISTORY: Oncology History  Malignant neoplasm of upper-outer quadrant of left breast in female, estrogen receptor positive (HCC)  06/22/2024 Cancer Staging   Staging form: Breast, AJCC 8th Edition - Clinical stage from 06/22/2024: Stage IB (cT2, cN0, cM0, G2, ER+, PR+, HER2+) - Signed by Lanny Callander, MD on 06/30/2024 Stage prefix: Initial diagnosis Histologic grading system: 3 grade system   06/28/2024 Initial Diagnosis   Malignant neoplasm of upper-outer quadrant of left breast in female, estrogen receptor positive (HCC)   07/27/2024 Cancer Staging   Staging form: Breast, AJCC 8th Edition - Pathologic stage from 07/27/2024: Stage IA (pT2, pN0, cM0, G2, ER+, PR+, HER2+) - Signed by Lanny Callander, MD on 08/08/2024 Stage prefix: Initial diagnosis Histologic grading system: 3 grade system Residual tumor (R): R0   08/23/2024 -  Chemotherapy   Patient is on Treatment Plan : BREAST Docetaxel + Carboplatin + Trastuzumab (TCH) q21d / Trastuzumab q21d        Discussed the use of AI scribe software for clinical note transcription with the patient, who gave verbal consent to proceed.  History of Present Illness [name] is a 78 year old female with breast cancer who presents for follow-up after surgery.  She underwent surgery for breast cancer with a slower than anticipated recovery. Fatigue and sensitivity in the area of lymph node removal are present. Tramadol  was used for pain relief, and ice packs help manage sensitivity around the port. Concerns about  lobular carcinoma in situ (LCIS) and the potential need for further surgery are present. She is considering a mastectomy due to concerns about LCIS as a precursor to invasive cancer. She is interested in evaluating her right breast for any issues, as LCIS was not detected in previous imaging but was found in pathology. Preparation for chemotherapy includes discussions about potential side effects such as diarrhea, fatigue, and changes in taste. She has started post-surgery exercises and is cautious about using her left arm for blood pressure and blood draws due to lymph node removal. She manages type 1 diabetes with a Tandem pump and has a history of heart disease with a stent placement, currently on blood pressure medication.     All other systems were reviewed with the patient and are negative.  MEDICAL HISTORY:  Past Medical History:  Diagnosis Date   Arthritis    OA   Atrial flutter (HCC)    a. s/p TEE/DCCV 08/16/13 (normal LV function, no LAA thrombus).b. s/p ablation by Dr Waddell 09-23-2013   Coronary artery disease    2009 LAD Promus stent   Dysrhythmia    Aflutter, no issues after ablation 2014   Fibroid    Hyperlipidemia    Hypertension    Insulin  pump in place    Osteopenia    Type 1 diabetes mellitus on insulin  therapy (HCC)    Valvular heart disease    a. Mild MR/TR by TEE 08/2013.    SURGICAL HISTORY: Past Surgical History:  Procedure Laterality Date   ABLATION  09/23/2013   CTI by Dr Waddell   APPENDECTOMY     APPLICATION OF WOUND VAC Left 04/11/2017   Procedure: APPLICATION OF WOUND VAC LEFT KNEE;  Surgeon: Fidel Rogue, MD;  Location: [facility] OR;  Service: Orthopedics;  Laterality: Left;   ATRIAL FLUTTER ABLATION N/A 09/23/2013  Procedure: ATRIAL FLUTTER ABLATION;  Surgeon: Danelle LELON Birmingham, MD;  Location: [facility] CATH LAB;  Service: Cardiovascular;  Laterality: N/A;   CARDIOVERSION N/A 08/16/2013   Procedure: CARDIOVERSION;  Surgeon: Aleene JINNY Passe, MD;  Location: [facility]  ENDOSCOPY;  Service: Cardiovascular;  Laterality: N/A;  spoke with Tom   CATARACT EXTRACTION Bilateral    COLONOSCOPY     CORONARY ANGIOPLASTY WITH STENT PLACEMENT     EYE SURGERY     Cataract   LASER ABLATION Right 11/20/2023   Right leg laser ablation of the Greater saphenous vein with >20 stab phlebectomies   MYOMECTOMY  1977   ORIF PATELLA Left 04/11/2017   ORIF PATELLA Left 04/11/2017   Procedure: OPEN REDUCTION INTERNAL (ORIF) FIXATION PATELLA LEFT KNEE;  Surgeon: Fidel Rogue, MD;  Location: [facility] OR;  Service: Orthopedics;  Laterality: Left;   PELVIC LAPAROSCOPY     PORTACATH PLACEMENT Right 07/27/2024   Procedure: INSERTION OF TUNNELED CENTRAL VENOUS DEVICE WITH PORT;  Surgeon: Vernetta Berg, MD;  Location: [facility];  Service: General;  Laterality: Right;  PORT-A-CATH INSERTION WITH ULTRASOUND GUIDANCE   SENTINEL NODE BIOPSY Left 07/27/2024   Procedure: LEFT SENTINEL LYMPH NODE BIOPSY;  Surgeon: Vernetta Berg, MD;  Location: [facility];  Service: General;  Laterality: Left;  SENTINEL NODE BIOPSY   TEE WITHOUT CARDIOVERSION N/A 08/16/2013   Procedure: TRANSESOPHAGEAL ECHOCARDIOGRAM (TEE);  Surgeon: Aleene JINNY Passe, MD;  Location: [facility] ENDOSCOPY;  Service: Cardiovascular;  Laterality: N/A;   TUBAL LIGATION  1988    I have reviewed the social history and family history with the patient and they are unchanged from previous note.  ALLERGIES:  is allergic to sulfa antibiotics and sulfamethoxazole-trimethoprim.  MEDICATIONS:  Current Outpatient Medications  Medication Sig Dispense Refill   Calcium  Carbonate-Vitamin D  (CALCIUM  + D PO) Take 600 mg by mouth 2 (two) times daily.     cholecalciferol  (VITAMIN D ) 1000 units tablet Take 1,000 Units by mouth 2 (two) times daily.      clopidogrel  (PLAVIX ) 75 MG tablet TAKE 1 TABLET DAILY 90 tablet 3   Continuous Blood Gluc Sensor (DEXCOM G6 SENSOR) MISC by Does not apply route.     cyanocobalamin  (VITAMIN  B12) 1000 MCG tablet Take 1,000 mcg by mouth daily.     dexamethasone  (DECADRON ) 4 MG tablet Take 1 tabs by mouth 2 times daily starting day before chemo. Then take 1 tabs daily for 2 days starting day after chemo. Take with food. 30 tablet 1   ezetimibe  (ZETIA ) 10 MG tablet Take 1 tablet (10 mg total) by mouth daily. 90 tablet 3   gabapentin  (NEURONTIN ) 100 MG capsule Take 2 capsules (200 mg total) by mouth at bedtime. 180 capsule 0   Insulin  Lispro-aabc (LYUMJEV ) 100 UNIT/ML SOLN USE A MAXIMUM OF 90 UNITS DAILY VIA INSULIN  PUMP 80 mL 3   lidocaine -prilocaine (EMLA) cream Apply to affected area once 30 g 3   losartan  (COZAAR ) 100 MG tablet Take 1 tablet (100 mg total) by mouth daily. 90 tablet 3   metFORMIN  (GLUCOPHAGE -XR) 500 MG 24 hr tablet TAKE 1 TABLET IN THE MORNING AND 2 TABLETS IN THE EVENING AS DIRECTED 270 tablet 3   metoprolol  succinate (TOPROL -XL) 100 MG 24 hr tablet TAKE 1 TABLET DAILY 90 tablet 3   Multiple Vitamins-Minerals (PRESERVISION AREDS 2 PO) Take by mouth in the morning and at bedtime.     nitroGLYCERIN  (NITROSTAT ) 0.4 MG SL tablet DISSOLVE 1 TABLET UNDER THE TONGUE EVERY 5 MINUTES  AS NEEDED FOR CHEST PAIN (Patient taking differently: Never used) 75 tablet 2   ondansetron  (ZOFRAN ) 8 MG tablet Take 1 tablet (8 mg total) by mouth every 8 (eight) hours as needed for nausea or vomiting. Start on the third day after chemotherapy. 30 tablet 1   prochlorperazine (COMPAZINE) 10 MG tablet Take 1 tablet (10 mg total) by mouth every 6 (six) hours as needed for nausea or vomiting. 30 tablet 1   Pyridoxine HCl (VITAMIN B6) 100 MG TABS      rosuvastatin  (CRESTOR ) 40 MG tablet Take 1 tablet (40 mg total) by mouth daily. 90 tablet 3   solifenacin  (VESICARE ) 10 MG tablet Take 1 tablet (10 mg total) by mouth daily. 90 tablet 3   spironolactone  (ALDACTONE ) 25 MG tablet Take 1 tablet (25 mg total) by mouth daily. 90 tablet 3   TART CHERRY PO Take by mouth.     traMADol  (ULTRAM ) 50 MG tablet Take  1 tablet (50 mg total) by mouth every 6 (six) hours as needed for moderate pain (pain score 4-6) or severe pain (pain score 7-10). 25 tablet 0   No current facility-administered medications for this visit.    PHYSICAL EXAMINATION: ECOG PERFORMANCE STATUS: 1 - Symptomatic but completely ambulatory  Vitals:   08/09/24 0831  BP: 132/64  Pulse: 75  Resp: 15  Temp: 97.9 F (36.6 C)  SpO2: 99%   Wt Readings from Last 3 Encounters:  08/09/24 195 lb 4.8 oz (88.6 kg)  07/27/24 195 lb 1.7 oz (88.5 kg)  07/08/24 198 lb (89.8 kg)     GENERAL:alert, no distress and comfortable SKIN: skin color, texture, turgor are normal, no rashes or significant lesions EYES: normal, Conjunctiva are pink and non-injected, sclera clear NECK: supple, thyroid  normal size, non-tender, without nodularity LYMPH:  no palpable lymphadenopathy in the cervical, axillary  LUNGS: clear to auscultation and percussion with normal breathing effort HEART: regular rate & rhythm and no murmurs and no lower extremity edema ABDOMEN:abdomen soft, non-tender and normal bowel sounds Musculoskeletal:no cyanosis of digits and no clubbing  NEURO: alert & oriented x 3 with fluent speech, no focal motor/sensory deficits BREAST: Incision healing well, no pain, looks good.  No skin erythema or discharge Physical Exam   LABORATORY DATA:  I have reviewed the data as listed    Latest Ref Rng & Units 06/30/2024    1:29 PM 11/22/2019    8:49 AM 04/11/2017    6:42 AM  CBC  WBC 4.0 - 10.5 K/uL 6.2  6.5  5.5   Hemoglobin 12.0 - 15.0 g/dL 84.1  85.5  87.1   Hematocrit 36.0 - 46.0 % 47.0  43.9  39.5   Platelets 150 - 400 K/uL 331  246  321         Latest Ref Rng & Units 07/29/2024    8:11 AM 06/30/2024    1:29 PM 03/26/2024    9:31 AM  CMP  Glucose 65 - 99 mg/dL 83  90  894   BUN 7 - 25 mg/dL 17  17  11    Creatinine 0.60 - 1.00 mg/dL 9.31  9.16  9.33   Sodium 135 - 146 mmol/L 142  140  140   Potassium 3.5 - 5.3 mmol/L 4.6  4.2   4.8   Chloride 98 - 110 mmol/L 107  102  104   CO2 20 - 32 mmol/L 27  26  30    Calcium  8.6 - 10.4 mg/dL 9.1  9.9  9.8  Total Protein 6.5 - 8.1 g/dL  7.3    Total Bilirubin 0.0 - 1.2 mg/dL  0.5    Alkaline Phos 38 - 126 U/L  106    AST 15 - 41 U/L  40    ALT 0 - 44 U/L  52        RADIOGRAPHIC STUDIES: I have personally reviewed the radiological images as listed and agreed with the findings in the report. No results found.    Orders Placed This Encounter  Procedures   Consent Attestation for Oncology Treatment    The patient is informed of risks, benefits, side-effects of the prescribed oncology treatment. Potential short term and long term side effects and response rates discussed. After a long discussion, the patient made informed decision to proceed.:   Yes   CBC with Differential (Cancer Center Only)    Standing Status:   Future    Expected Date:   08/23/2024    Expiration Date:   08/23/2025   CMP (Cancer Center only)    Standing Status:   Future    Expected Date:   08/23/2024    Expiration Date:   08/23/2025   CBC with Differential (Cancer Center Only)    Standing Status:   Future    Expected Date:   09/13/2024    Expiration Date:   09/13/2025   CMP (Cancer Center only)    Standing Status:   Future    Expected Date:   09/13/2024    Expiration Date:   09/13/2025   CBC with Differential (Cancer Center Only)    Standing Status:   Future    Expected Date:   10/04/2024    Expiration Date:   10/04/2025   CMP (Cancer Center only)    Standing Status:   Future    Expected Date:   10/04/2024    Expiration Date:   10/04/2025   PHYSICIAN COMMUNICATION ORDER    A baseline Echo/Muga should be obtained prior to initiation of Trastuzumab, at 3, 6, 9 months during Trastuzumab treatment.   ONCBCN PHYSICIAN COMMUNICATION 1    Trastuzumab SQ options for each treatment day available via Add Orders.   ONCBCN PHYSICIAN COMMUNICATION 4    0 Number of doses of carboplatin received at [facility] or outside facility. signature of Provider. If patient has received greater than 5 doses of carboplatin, the following pre-medications should be ordered: dexamethasone , diphenhydramine , and formulary histamine H2 antagonist. If patient cannot tolerate oral histamine H2 antagonist, IV may be given.   All questions were answered. The patient knows to call the clinic with any problems, questions or concerns. No barriers to learning was detected. The total time spent in the appointment was 45 minutes, including review of chart and various tests results, discussions about plan of care and coordination of care plan     Onita Mattock, MD 08/09/2024

## 2024-08-09 NOTE — Progress Notes (Signed)
 START ON PATHWAY REGIMEN - Breast     Cycle 1: A cycle is 21 days:     Trastuzumab-xxxx      Docetaxel      Carboplatin    Cycles 2 through 6: A cycle is every 21 days:     Trastuzumab-xxxx      Docetaxel      Carboplatin    Cycles 7 through 17: A cycle is every 21 days:     Trastuzumab-xxxx   **Always confirm dose/schedule in your pharmacy ordering system**  Patient Characteristics: Postoperative without Neoadjuvant Therapy, M0 (Pathologic Staging), Invasive Disease, Adjuvant Therapy, HER2 Positive, ER Positive, Node Negative, pT2, pN0, Tumor Size >  3 cm Therapeutic Status: Postoperative without Neoadjuvant Therapy, M0 (Pathologic Staging) AJCC Grade: G2 AJCC N Category: pN0 AJCC M Category: cM0 ER Status: Positive (+) AJCC 8 Stage Grouping: IA HER2 Status: Positive (+) Oncotype Dx Recurrence Score: Not Appropriate AJCC T Category: pT2 PR Status: Positive (+) Intent of Therapy: Curative Intent, Discussed with Patient

## 2024-08-10 ENCOUNTER — Telehealth: Payer: Self-pay | Admitting: *Deleted

## 2024-08-10 ENCOUNTER — Other Ambulatory Visit: Payer: Self-pay

## 2024-08-10 NOTE — Telephone Encounter (Signed)
 [id number], ICE COMPRESS: RANDOMIZED TRIAL OF LIMB CRYOCOMPRESSION VERSUS CONTINUOUS COMPRESSION VERSUS LOW CYCLIC COMPRESSION FOR THE PREVENTION  OF TAXANE-INDUCED PERIPHERAL NEUROPATHY   Patient [name] was identified by Dr. Lanny as a potential candidate for the above listed study.  This Clinical Research Nurse called CRAIG WISNEWSKI, [phone], to discuss participation in the above listed research study.  A copy of the informed consent document and patient information sheet were emailed to the patient.  Patient reads, speaks, and understands [place].   Patient was provided with the phone number of this Nurse and encouraged to contact the research team with any questions.  Approximately 10 minutes was spent with the patient reviewing the study intervention and assessments. Patient was encouraged to review the informed consent form and patient education sheet at their convenience with their support network, including other care providers.  We also discussed that patient is planning on using the scalp cooling cap device with her treatment and this may cause additional challenge in comfort if patient is attached to two different devices. Informed patient it is possible to do both but we also want patient to be aware of what that would look like during her treatment so she can make an informed decision. Patient verbalized understanding and agreed for research nurse to follow up with her next week at her patient education class.   Cherylyn Hoard, BSN, RN, [employer] Research Nurse II [phone] 08/10/2024 11:11 AM

## 2024-08-11 ENCOUNTER — Encounter: Payer: Self-pay | Admitting: *Deleted

## 2024-08-11 ENCOUNTER — Telehealth: Payer: Self-pay | Admitting: *Deleted

## 2024-08-11 NOTE — Telephone Encounter (Signed)
 Spoke with patient to give her instructions on dignicap.  Emailed booklet and instructions per her request.

## 2024-08-12 ENCOUNTER — Encounter: Payer: Self-pay | Admitting: Endocrinology

## 2024-08-12 ENCOUNTER — Ambulatory Visit (INDEPENDENT_AMBULATORY_CARE_PROVIDER_SITE_OTHER): Admitting: Endocrinology

## 2024-08-12 VITALS — BP 128/80 | HR 68 | Resp 20 | Ht 69.0 in | Wt 194.4 lb

## 2024-08-12 DIAGNOSIS — E109 Type 1 diabetes mellitus without complications: Secondary | ICD-10-CM | POA: Diagnosis not present

## 2024-08-12 NOTE — Progress Notes (Addendum)
 Outpatient Endocrinology Note [place] Conley Pawling, MD  08/12/24  Patient's Name: [name]    DOB: [birth year]    MRN: [id number]                                                    REASON OF VISIT: Follow up for type 1 diabetes mellitus  PCP: Rollene Almarie LABOR, MD  HISTORY OF PRESENT ILLNESS:   [name] is a 78 y.o. old female with past medical history listed below, is here for follow up of type 1 diabetes mellitus.   Pertinent Diabetes History: Patient was diagnosed with type 1 diabetes mellitus in 1979.  She has been on t:slim tandem insulin  pump.  She has also been on metformin .  She used to be on pioglitazone  in the past which had decreased requirement of insulin  dose.  She has been on t:slim tandem insulin  pump since October 2021.  Chronic Diabetes Complications : Retinopathy: no. Last ophthalmology exam was done on annually, 12/2023. Reviewed office note scAnned in media,  Nephropathy: no, on losartan . Peripheral neuropathy: no Coronary artery disease: no Stroke: no  Relevant comorbidities and cardiovascular risk factors: Obesity: yes Body mass index is 28.71 kg/m.  Hypertension: yes Hyperlipidemia. yes  Current / Home Diabetic regimen includes:  T:slim insulin  pump with Dexcom G7 uses Lyumjev  U100  Insulin  Pump setting:  Basal ( 36.50) MN- 0.5u/hour 3:30AM-1.5  5AM- 1.4 6AM- 2.2 10AM- 0.750 11AM- 0.750 1PM-   1.6 3:30PM-1.9 4:30PM-1.8 8PM-     2.0 10PM-   1.8  Bolus CHO Ratio (1unit:CHO) MN- 1:12 3:30AM-1:12 5AM- 1:12 6AM- 1:12 10AM- 1:12 11AM- 1:5 1PM-   1:5 3:30PM-1:5 4:30PM-1:5 8PM-     1:5 10PM-   1:5.5   Correction/Sensitivity: MN- 1:25 3:30AM-1:25  5AM- 1:25 6AM- 1:25 10AM- 1:30 11AM- 1:25 1PM-   1:20 3:30PM-1:20 4:30PM-1:20 8PM-     1:25 10PM-   1:25  Target: 125  Active insulin  time: 5 hours  -Metformin  extended release 500 mg in the morning and 1000 mg in the evening.  Prior diabetic  medications: Pioglitazone .  CONTINUOUS GLUCOSE MONITORING SYSTEM (CGMS) / INSULIN  PUMP INTERPRETATION:    Tandem t:slim pump with Dexcom G7  Date : September 12 to October 9 , 2025, 14-day  Average total daily dose 64 units, basal 51%, bolus 49%, manual 54% control IQ 46%.  Control IQ 100%.  CGM in use 100%.  GMI 7.0%.                          Impression :  Mostly acceptable blood sugar, with occasional hyperglycemia with blood sugar up to 200-300 range generally related to not enough meal bolus and sometimes late meal bolus.  Postprandial hyperglycemia generally when she is eating at night or early morning.  Rare mild hypoglycemia related to insulin  stacking due to auto correctional bolus, meal bolus and manual bolus.  On September 23 she has significant hyperglycemia persistent for several hours when she had lumpectomy.  No concerning hypoglycemia.  Hypoglycemia: Patient has minor hypoglycemic episodes. Patient has hypoglycemia awareness.    Factors modifying glucose control: 1.  Diabetic diet assessment: 3 meals a day.  Usually eats outside on Friday evening and weekend  2.  Staying active or exercising: Walking regularly.  3.  Medication compliance: compliant all  of the time.  Interval history  Patient was recently diagnosed with breast cancer, s/p lumpectomy in September planning for chemotherapy.  Possibly mastectomy.  Pump and CGM data as reviewed above.  Hemoglobin A1c 7.1%.  Patient reports past few weeks has been quite stressful due to recent diagnosis of breast cancer.  She also had trouble getting pump supplies, requested for additional documents.  She had lab for C-peptide recently.  Low C-peptide level.  She is known to have type 1 diabetes mellitus for several years since 1979.  She has no other complaints today.  REVIEW OF SYSTEMS As per history of present illness.   PAST MEDICAL HISTORY: Past Medical History:  Diagnosis Date   Arthritis    OA   Atrial  flutter (HCC)    a. s/p TEE/DCCV 08/16/13 (normal LV function, no LAA thrombus).b. s/p ablation by Dr Waddell 09-23-2013   Coronary artery disease    2009 LAD Promus stent   Dysrhythmia    Aflutter, no issues after ablation 2014   Fibroid    Hyperlipidemia    Hypertension    Insulin  pump in place    Osteopenia    Type 1 diabetes mellitus on insulin  therapy (HCC)    Valvular heart disease    a. Mild MR/TR by TEE 08/2013.    PAST SURGICAL HISTORY: Past Surgical History:  Procedure Laterality Date   ABLATION  09/23/2013   CTI by Dr Waddell   APPENDECTOMY     APPLICATION OF WOUND VAC Left 04/11/2017   Procedure: APPLICATION OF WOUND VAC LEFT KNEE;  Surgeon: Fidel Rogue, MD;  Location: [facility] OR;  Service: Orthopedics;  Laterality: Left;   ATRIAL FLUTTER ABLATION N/A 09/23/2013   Procedure: ATRIAL FLUTTER ABLATION;  Surgeon: Danelle LELON Waddell, MD;  Location[facility] CATH LAB;  Service: Cardiovascular;  Laterality: N/A;   CARDIOVERSION N/A 08/16/2013   Procedure: CARDIOVERSION;  Surgeon: Aleene JINNY Passe, MD;  Location: [facility] ENDOSCOPY;  Service: Cardiovascular;  Laterality: N/A;  spoke with Tom   CATARACT EXTRACTION Bilateral    COLONOSCOPY     CORONARY ANGIOPLASTY WITH STENT PLACEMENT     EYE SURGERY     Cataract   LASER ABLATION Right 11/20/2023   Right leg laser ablation of the Greater saphenous vein with >20 stab phlebectomies   MYOMECTOMY  1977   ORIF PATELLA Left 04/11/2017   ORIF PATELLA Left 04/11/2017   Procedure: OPEN REDUCTION INTERNAL (ORIF) FIXATION PATELLA LEFT KNEE;  Surgeon: Fidel Rogue, MD;  Location: [facility] OR;  Service: Orthopedics;  Laterality: Left;   PELVIC LAPAROSCOPY     PORTACATH PLACEMENT Right 07/27/2024   Procedure: INSERTION OF TUNNELED CENTRAL VENOUS DEVICE WITH PORT;  Surgeon: Vernetta Berg, MD;  Location: [facility];  Service: General;  Laterality: Right;  PORT-A-CATH INSERTION WITH ULTRASOUND GUIDANCE   SENTINEL NODE BIOPSY Left 07/27/2024    Procedure: LEFT SENTINEL LYMPH NODE BIOPSY;  Surgeon: Vernetta Berg, MD;  Location: [facility];  Service: General;  Laterality: Left;  SENTINEL NODE BIOPSY   TEE WITHOUT CARDIOVERSION N/A 08/16/2013   Procedure: TRANSESOPHAGEAL ECHOCARDIOGRAM (TEE);  Surgeon: Aleene JINNY Passe, MD;  Location: [facility] ENDOSCOPY;  Service: Cardiovascular;  Laterality: N/A;   TUBAL LIGATION  1988    ALLERGIES: Allergies  Allergen Reactions   Sulfa Antibiotics Rash   Sulfamethoxazole-Trimethoprim Rash    FAMILY HISTORY:  Family History  Problem Relation Age of Onset   CAD Father 32       died age 16  Early death Father    Heart disease Father    CAD Brother 30       small vessel disease   Early death Brother    Heart disease Brother    Atrial fibrillation Mother    Diabetes Mother    Hypertension Mother    Arthritis Mother    Hearing loss Mother    CAD Cousin        paternal   CAD Paternal Grandfather        early onset   Colon cancer Neg Hx     SOCIAL HISTORY: Social History   Socioeconomic History   Marital status: Married    Spouse name: Rolan   Number of children: 0   Years of education: 14   Highest education level: Bachelor's degree (e.g., BA, AB, BS)  Occupational History   Occupation: home maker    Comment: Children adopted  Tobacco Use   Smoking status: Never    Passive exposure: Never   Smokeless tobacco: Never  Vaping Use   Vaping status: Never Used  Substance and Sexual Activity   Alcohol  use: Yes    Alcohol /week: 3.0 - 4.0 standard drinks of alcohol     Types: 3 - 4 Glasses of wine per week    Comment: on weekends   Drug use: Never   Sexual activity: Yes    Birth control/protection: Post-menopausal  Other Topics Concern   Not on file  Social History Narrative   Not on file   Social Drivers of Health   Financial Resource Strain: Low Risk  (01/27/2024)   Overall Financial Resource Strain (CARDIA)    Difficulty of Paying Living Expenses: Not hard  at all  Food Insecurity: No Food Insecurity (06/30/2024)   Hunger Vital Sign    Worried About Running Out of Food in the Last Year: Never true    Ran Out of Food in the Last Year: Never true  Transportation Needs: No Transportation Needs (06/30/2024)   PRAPARE - Administrator, Civil Service (Medical): No    Lack of Transportation (Non-Medical): No  Physical Activity: Sufficiently Active (01/27/2024)   Exercise Vital Sign    Days of Exercise per Week: 7 days    Minutes of Exercise per Session: 30 min  Recent Concern: Physical Activity - Insufficiently Active (10/31/2023)   Exercise Vital Sign    Days of Exercise per Week: 3 days    Minutes of Exercise per Session: 30 min  Stress: No Stress Concern Present (01/27/2024)   [name] of Occupational Health - Occupational Stress Questionnaire    Feeling of Stress : Not at all  Social Connections: Socially Integrated (01/27/2024)   Social Connection and Isolation Panel    Frequency of Communication with Friends and Family: More than three times a week    Frequency of Social Gatherings with Friends and Family: More than three times a week    Attends Religious Services: More than 4 times per year    Active Member of [employer] or Organizations: Yes    Attends Engineer, structural: More than 4 times per year    Marital Status: Married    MEDICATIONS:  Current Outpatient Medications  Medication Sig Dispense Refill   Calcium  Carbonate-Vitamin D  (CALCIUM  + D PO) Take 600 mg by mouth 2 (two) times daily.     cholecalciferol  (VITAMIN D ) 1000 units tablet Take 1,000 Units by mouth 2 (two) times daily.      clopidogrel  (PLAVIX ) 75 MG tablet  TAKE 1 TABLET DAILY 90 tablet 3   Continuous Glucose Sensor (DEXCOM G7 15 DAY SENSOR) MISC by Does not apply route.     cyanocobalamin  (VITAMIN B12) 1000 MCG tablet Take 1,000 mcg by mouth daily.     dexamethasone  (DECADRON ) 4 MG tablet Take 1 tabs by mouth 2 times daily starting day before  chemo. Then take 1 tabs daily for 2 days starting day after chemo. Take with food. 30 tablet 1   ezetimibe  (ZETIA ) 10 MG tablet Take 1 tablet (10 mg total) by mouth daily. 90 tablet 3   gabapentin  (NEURONTIN ) 100 MG capsule Take 2 capsules (200 mg total) by mouth at bedtime. 180 capsule 0   Insulin  Lispro-aabc (LYUMJEV ) 100 UNIT/ML SOLN USE A MAXIMUM OF 90 UNITS DAILY VIA INSULIN  PUMP 80 mL 3   lidocaine -prilocaine (EMLA) cream Apply to affected area once 30 g 3   losartan  (COZAAR ) 100 MG tablet Take 1 tablet (100 mg total) by mouth daily. 90 tablet 3   metFORMIN  (GLUCOPHAGE -XR) 500 MG 24 hr tablet TAKE 1 TABLET IN THE MORNING AND 2 TABLETS IN THE EVENING AS DIRECTED 270 tablet 3   metoprolol  succinate (TOPROL -XL) 100 MG 24 hr tablet TAKE 1 TABLET DAILY 90 tablet 3   Multiple Vitamins-Minerals (PRESERVISION AREDS 2 PO) Take by mouth in the morning and at bedtime.     nitroGLYCERIN  (NITROSTAT ) 0.4 MG SL tablet DISSOLVE 1 TABLET UNDER THE TONGUE EVERY 5 MINUTES AS NEEDED FOR CHEST PAIN (Patient taking differently: Never used) 75 tablet 2   ondansetron  (ZOFRAN ) 8 MG tablet Take 1 tablet (8 mg total) by mouth every 8 (eight) hours as needed for nausea or vomiting. Start on the third day after chemotherapy. 30 tablet 1   prochlorperazine (COMPAZINE) 10 MG tablet Take 1 tablet (10 mg total) by mouth every 6 (six) hours as needed for nausea or vomiting. 30 tablet 1   Pyridoxine HCl (VITAMIN B6) 100 MG TABS      rosuvastatin  (CRESTOR ) 40 MG tablet Take 1 tablet (40 mg total) by mouth daily. 90 tablet 3   solifenacin  (VESICARE ) 10 MG tablet Take 1 tablet (10 mg total) by mouth daily. 90 tablet 3   spironolactone  (ALDACTONE ) 25 MG tablet Take 1 tablet (25 mg total) by mouth daily. 90 tablet 3   TART CHERRY PO Take by mouth.     traMADol  (ULTRAM ) 50 MG tablet Take 1 tablet (50 mg total) by mouth every 6 (six) hours as needed for moderate pain (pain score 4-6) or severe pain (pain score 7-10). 25 tablet 0    No current facility-administered medications for this visit.    PHYSICAL EXAM: Vitals:   08/12/24 1032  BP: 128/80  Pulse: 68  Resp: 20  SpO2: 99%  Weight: 194 lb 6.4 oz (88.2 kg)  Height: 5' 9 (1.753 m)     Body mass index is 28.71 kg/m.  Wt Readings from Last 3 Encounters:  08/12/24 194 lb 6.4 oz (88.2 kg)  08/09/24 195 lb 4.8 oz (88.6 kg)  07/27/24 195 lb 1.7 oz (88.5 kg)    General: Well developed, well nourished female in no apparent distress.  HEENT: AT/[place], no external lesions.  Eyes: Conjunctiva clear and no icterus. Neurologic: Alert, oriented, normal speech Psychiatric: Does not appear depressed or anxious  Diabetic Foot Exam - Simple   No data filed    LABS Reviewed Lab Results  Component Value Date   HGBA1C 7.1 (H) 07/29/2024   HGBA1C 6.9 (H) 03/26/2024  HGBA1C 6.7 (H) 12/22/2023   Lab Results  Component Value Date   FRUCTOSAMINE 270 10/06/2019   FRUCTOSAMINE 292 (H) 02/04/2017   FRUCTOSAMINE 278 05/11/2013   Lab Results  Component Value Date   CHOL 88 03/26/2024   HDL 42 (L) 03/26/2024   LDLCALC 32 03/26/2024   LDLDIRECT 79.0 01/11/2015   TRIG 59 03/26/2024   CHOLHDL 2.1 03/26/2024   Lab Results  Component Value Date   MICRALBCREAT NOTE 07/29/2024   Lab Results  Component Value Date   CREATININE 0.68 07/29/2024   Lab Results  Component Value Date   GFR 67.08 04/02/2023    ASSESSMENT / PLAN  1. Type 1 diabetes mellitus without complications (HCC)    Diabetes Mellitus type 1, complicated by no known complications.  - Diabetic status / severity: Fair controlled.   Lab Results  Component Value Date   HGBA1C 7.1 (H) 07/29/2024    - Hemoglobin A1c goal <7%   Patient has type 1 diabetes mellitus diagnosed in 1979.  Recent lab result with low C-peptide with blood sugar of 83.   Latest Reference Range & Units 07/29/24 08:11  eAG (mmol/L) mmol/L 8.7  Glucose 65 - 99 mg/dL 83  Hemoglobin J8R <4.2 % 7.1 (H)  C-Peptide 0.80 -  3.85 ng/mL 0.21 (L)  (H): Data is abnormally high (L): Data is abnormally low  - Medications:  Tandem t:slim insulin  pump on control IQ using Dexcom G7.uses Lyumjev  U100  Changed carb ratio as follows to prevent postprandial hyperglycemia.   Insulin  Pump setting:  Basal ( 36.50) MN- 0.5u/hour 3:30AM-1.5  5AM- 1.4 6AM- 2.2 10AM- 0.750 11AM- 0.750 1PM-   1.6 3:30PM-1.9 4:30PM-1.8 8PM-     2.0 10PM-   1.8  Bolus CHO Ratio (1unit:CHO) MN- 1:12, changed to 1 : 8 3:30AM-1:12, changed to 1: 8 5AM- 1:12, changed to 1:8 6AM- 1:12, changed to 1:5 10AM- 1:12, changed to 1: 5 11AM- 1:5 1PM-   1:5 3:30PM-1:5 4:30PM-1:5 8PM-     1:5 10PM-   1:5.5   Correction/Sensitivity: MN- 1:25 3:30AM-1:25  5AM- 1:25 6AM- 1:25 10AM- 1:30 11AM- 1:25 1PM-   1:20 3:30PM-1:20 4:30PM-1:20 8PM-     1:25 10PM-   1:25  Target: 125  Active insulin  time: 5 hours  Patient is recently diagnosed with breast cancer, planning for chemotherapy.  Patient can have hyperglycemia related to use of steroid during chemotherapy, possibly require increased amount of meal bolus, asked patient to call our clinic if she has any concerning blood sugar.  Discussed about avoiding manual correctional boluses , allow for pump for auto correctional boluses in case of hyperglycemia to avoid hypoglycemia due to insulin  stacking.  -Continue metformin  extended release 500 mg in the morning and 1000 mg in the evening.  - Home glucose testing: continue CGM and check blood glucose as needed.  - Discussed/ Gave Hypoglycemia treatment plan.  # Consult : not required at this time.   # Annual urine for microalbuminuria/ creatinine ratio, no microalbuminuria currently, continue ACE/ARB losartan . Last  Lab Results  Component Value Date   MICRALBCREAT NOTE 07/29/2024    # Foot check nightly.  # Annual dilated diabetic eye exams.   - Diet: Eat reasonable portion sizes to promote a healthy weight - Life style /  activity / exercise: Discussed  2. Blood pressure  -  BP Readings from Last 1 Encounters:  08/12/24 128/80    - Control is in target. Blood pressure not measured today.  This is part of  visit. - No change in current plans.  3. Lipid status / Hyperlipidemia - Last  Lab Results  Component Value Date   LDLCALC 32 03/26/2024   - Continue rosuvastatin  40 mg daily and Zetia  10 mg daily.  Diagnoses and all orders for this visit:  Type 1 diabetes mellitus without complications (HCC)    DISPOSITION Follow up in clinic in 3 months suggested.   All questions answered and patient verbalized understanding of the plan.  [place] Jermie Hippe, MD [facility] [address] [place], [address] [place], [place] [address] Phone # [phone]  At least part of this note was generated using voice recognition software. Inadvertent word errors may have occurred, which were not recognized during the proofreading process.

## 2024-08-16 ENCOUNTER — Encounter: Payer: Self-pay | Admitting: Cardiology

## 2024-08-16 ENCOUNTER — Encounter: Payer: Self-pay | Admitting: Hematology

## 2024-08-17 NOTE — Progress Notes (Signed)
 Pharmacist Chemotherapy Monitoring - Initial Assessment    Anticipated start date: 08/25/24   The following has been reviewed per standard work regarding the patient's treatment regimen: The patient's diagnosis, treatment plan and drug doses, and organ/hematologic function Lab orders and baseline tests specific to treatment regimen  The treatment plan start date, drug sequencing, and pre-medications Prior authorization status  Patient's documented medication list, including drug-drug interaction screen and prescriptions for anti-emetics and supportive care specific to the treatment regimen The drug concentrations, fluid compatibility, administration routes, and timing of the medications to be used The patient's access for treatment and lifetime cumulative dose history, if applicable  The patient's medication allergies and previous infusion related reactions, if applicable   Changes made to treatment plan:  treatment plan date  Follow up needed:  N/A   [name], Pharm.D., CPP 08/17/2024@12 :29 PM

## 2024-08-18 ENCOUNTER — Encounter: Payer: Self-pay | Admitting: *Deleted

## 2024-08-18 ENCOUNTER — Inpatient Hospital Stay

## 2024-08-18 ENCOUNTER — Inpatient Hospital Stay: Admitting: Pharmacist

## 2024-08-18 DIAGNOSIS — Z170 Estrogen receptor positive status [ER+]: Secondary | ICD-10-CM | POA: Diagnosis not present

## 2024-08-18 DIAGNOSIS — Z5189 Encounter for other specified aftercare: Secondary | ICD-10-CM | POA: Diagnosis not present

## 2024-08-18 DIAGNOSIS — Z1731 Human epidermal growth factor receptor 2 positive status: Secondary | ICD-10-CM | POA: Diagnosis not present

## 2024-08-18 DIAGNOSIS — Z1721 Progesterone receptor positive status: Secondary | ICD-10-CM | POA: Diagnosis not present

## 2024-08-18 DIAGNOSIS — C50412 Malignant neoplasm of upper-outer quadrant of left female breast: Secondary | ICD-10-CM | POA: Diagnosis not present

## 2024-08-18 DIAGNOSIS — Z5111 Encounter for antineoplastic chemotherapy: Secondary | ICD-10-CM | POA: Diagnosis not present

## 2024-08-18 NOTE — Progress Notes (Signed)
 [facility]       Telephone: [phone]?Fax: [phone]   Oncology Clinical Pharmacist Practitioner Initial Assessment  [name] is a 78 y.o. female with a diagnosis of breast cancer. They were contacted today via in-person visit.  Indication/Regimen TCH: Trastuzumab (Herceptin) and docetaxel (Taxotere) and carboplatin (Paraplatin) are being used appropriately for treatment of breast cancer by Dr. Onita Mattock.      Wt Readings from Last 1 Encounters:  08/12/24 194 lb 6.4 oz (88.2 kg)    Estimated body surface area is 2.07 meters squared as calculated from the following:   Height as of 08/12/24: 5' 9 (1.753 m).   Weight as of 08/12/24: 194 lb 6.4 oz (88.2 kg).  The dosing regimen is every 21 days for 6 cycles  Trastuzumab (8 mg/kg load, 6 mg/kg maintenance) on Day 1 Docetaxel (75 mg/m2) on Day 1 Carboplatin (AUC 6) on Day 1 Pegfilgrastim (6 mg) on Day 3  Dose Modifications Dr. Mattock reducing dose of carboplatin to AUC 5   Allergies Allergies  Allergen Reactions   Sulfa Antibiotics Rash   Sulfamethoxazole-Trimethoprim Rash    Vitals    08/12/2024   10:32 AM 08/09/2024    8:31 AM 07/27/2024    3:15 PM  Oncology Vitals  Height 175 cm 175 cm   Weight 88.179 kg 88.587 kg   Weight (lbs) 194 lbs 6 oz 195 lbs 5 oz   BMI 28.71 kg/m2 28.84 kg/m2   Temp  97.9 F (36.6 C) 97.3 F (36.3 C)  Pulse Rate 68 75 68  BP 128/80 132/64 131/63  Resp 20 15 12   SpO2 99 % 99 % 95 %  BSA (m2) 2.07 m2 2.08 m2      Laboratory Data    Latest Ref Rng & Units 06/30/2024    1:29 PM 11/22/2019    8:49 AM 04/11/2017    6:42 AM  CBC EXTENDED  WBC 4.0 - 10.5 K/uL 6.2  6.5  5.5   RBC 3.87 - 5.11 MIL/uL 5.28  4.68  4.31   Hemoglobin 12.0 - 15.0 g/dL 84.1  85.5  87.1   HCT 36.0 - 46.0 % 47.0  43.9  39.5   Platelets 150 - 400 K/uL 331  246  321   NEUT# 1.7 - 7.7 K/uL 4.0     Lymph# 0.7 - 4.0 K/uL 1.3          Latest Ref Rng & Units 07/29/2024    8:11 AM 06/30/2024     1:29 PM 03/26/2024    9:31 AM  CMP  Glucose 65 - 99 mg/dL 83  90  894   BUN 7 - 25 mg/dL 17  17  11    Creatinine 0.60 - 1.00 mg/dL 9.31  9.16  9.33   Sodium 135 - 146 mmol/L 142  140  140   Potassium 3.5 - 5.3 mmol/L 4.6  4.2  4.8   Chloride 98 - 110 mmol/L 107  102  104   CO2 20 - 32 mmol/L 27  26  30    Calcium  8.6 - 10.4 mg/dL 9.1  9.9  9.8   Total Protein 6.5 - 8.1 g/dL  7.3    Total Bilirubin 0.0 - 1.2 mg/dL  0.5    Alkaline Phos 38 - 126 U/L  106    AST 15 - 41 U/L  40    ALT 0 - 44 U/L  52     Contraindications Contraindications were reviewed? Yes  Contraindications to therapy were identified? No   Safety Precautions (written information also provided) The following safety precautions for the use of TCH were reviewed:  Decreased hemoglobin, part of the red blood cells that carry iron and oxygen Decreased platelet count and increased risk of bleeding Decreased white blood cells (WBCs) and increased risk for infection Fever: reviewed the importance of having a thermometer and the Centers for Disease Control and Prevention (CDC) definition of fever which is 100.72F (38C) or higher. Patient should call 24/7 triage at [phone] if experiencing a fever or any other symptoms Hair Loss Muscle or joint pain or weakness Fatigue Nausea or vomiting Mouth sores Diarrhea Irregular menses (if applicable) Peripheral neuropathy: numbness or tingling in hands and feet Fluid retention or swelling (edema) Nail Changes Rash or itchy skin Fluid retention or swelling (edema) Taste changes Changes in kidney function Changes in electrolytes and other laboratory values (low potassium, low magnesium) Cardiotoxicity from trastuzumab Infusion reactions Pneumonitis from trastuzumab Docetaxel irritating veins Liver toxicity from docetaxel Docetaxel can cause eye pain, blurred vision, tearing, and light sensitivity Handling body fluids and waste Intimacy, sexual activity,  contraception, fertility  Medication Reconciliation Current Outpatient Medications  Medication Sig Dispense Refill   Calcium  Carbonate-Vitamin D  (CALCIUM  + D PO) Take 600 mg by mouth 2 (two) times daily.     cholecalciferol  (VITAMIN D ) 1000 units tablet Take 1,000 Units by mouth 2 (two) times daily.      clopidogrel  (PLAVIX ) 75 MG tablet TAKE 1 TABLET DAILY 90 tablet 3   Continuous Glucose Sensor (DEXCOM G7 15 DAY SENSOR) MISC by Does not apply route.     cyanocobalamin  (VITAMIN B12) 1000 MCG tablet Take 1,000 mcg by mouth daily.     ezetimibe  (ZETIA ) 10 MG tablet Take 1 tablet (10 mg total) by mouth daily. 90 tablet 3   gabapentin  (NEURONTIN ) 100 MG capsule Take 2 capsules (200 mg total) by mouth at bedtime. 180 capsule 0   Insulin  Lispro-aabc (LYUMJEV ) 100 UNIT/ML SOLN USE A MAXIMUM OF 90 UNITS DAILY VIA INSULIN  PUMP 80 mL 3   losartan  (COZAAR ) 100 MG tablet Take 1 tablet (100 mg total) by mouth daily. 90 tablet 3   metFORMIN  (GLUCOPHAGE -XR) 500 MG 24 hr tablet TAKE 1 TABLET IN THE MORNING AND 2 TABLETS IN THE EVENING AS DIRECTED 270 tablet 3   metoprolol  succinate (TOPROL -XL) 100 MG 24 hr tablet TAKE 1 TABLET DAILY 90 tablet 3   Multiple Vitamins-Minerals (PRESERVISION AREDS 2 PO) Take by mouth in the morning and at bedtime.     nitroGLYCERIN  (NITROSTAT ) 0.4 MG SL tablet DISSOLVE 1 TABLET UNDER THE TONGUE EVERY 5 MINUTES AS NEEDED FOR CHEST PAIN 75 tablet 2   Pyridoxine HCl (VITAMIN B6) 100 MG TABS      rosuvastatin  (CRESTOR ) 40 MG tablet Take 1 tablet (40 mg total) by mouth daily. 90 tablet 3   solifenacin  (VESICARE ) 10 MG tablet Take 1 tablet (10 mg total) by mouth daily. 90 tablet 3   spironolactone  (ALDACTONE ) 25 MG tablet Take 1 tablet (25 mg total) by mouth daily. 90 tablet 3   TART CHERRY PO Take by mouth.     traMADol  (ULTRAM ) 50 MG tablet Take 1 tablet (50 mg total) by mouth every 6 (six) hours as needed for moderate pain (pain score 4-6) or severe pain (pain score 7-10). 25 tablet  0   dexamethasone  (DECADRON ) 4 MG tablet Take 1 tabs by mouth 2 times daily starting day before chemo. Then take 1  tabs daily for 2 days starting day after chemo. Take with food. (Patient not taking: Reported on 08/18/2024) 30 tablet 1   lidocaine -prilocaine (EMLA) cream Apply to affected area once (Patient not taking: Reported on 08/18/2024) 30 g 3   ondansetron  (ZOFRAN ) 8 MG tablet Take 1 tablet (8 mg total) by mouth every 8 (eight) hours as needed for nausea or vomiting. Start on the third day after chemotherapy. (Patient not taking: Reported on 08/18/2024) 30 tablet 1   prochlorperazine (COMPAZINE) 10 MG tablet Take 1 tablet (10 mg total) by mouth every 6 (six) hours as needed for nausea or vomiting. (Patient not taking: Reported on 08/18/2024) 30 tablet 1   No current facility-administered medications for this visit.    Medication reconciliation is based on the patient's most recent medication list in the electronic medical record (EMR) including herbal products and OTC medications.   The patient's medication list was reviewed today with the patient? Yes   Drug-drug interactions (DDIs) DDIs were evaluated? Yes Significant DDIs identified? No , she is aware of possibility of increased potassium with spironolactone  and losartan   Drug-Food Interactions Drug-food interactions were evaluated? Yes Drug-food interactions identified? Avoid grapefruit products  Follow-up Plan  Treatment start date: 08/25/24 Port placement date: 07/27/24 ECHO date: 07/02/24 Can use over-the-counter (OTC) options of loperamide (Imodium) as needed for diarrhea, loratadine (Claritin) as needed for G-CSF bone pain, and docusate + senna (Senna-S) as needed for constipation.  We reviewed the prescriptions, premedications, and treatment regimen with the patient. Possible side effects of the treatment regimen were reviewed and management strategies were discussed.  Clinical pharmacy will assist Dr. Onita Mattock and Leita JINNY Alert on an as needed basis going forward  GABRIELLA WOODHEAD participated in the discussion, expressed understanding, and voiced agreement with the above plan. All questions were answered to her satisfaction. The patient was advised to contact the clinic at [phone] with any questions or concerns prior to her return visit.   I spent 60 minutes assessing the patient.  Tamanna Whitson A. Lucila, PharmD, BCOP, CPP  Norleen DELENA Lucila, RPH-CPP, 08/18/2024 10:15 AM  **Disclaimer: This note was dictated with voice recognition software. Similar sounding words can inadvertently be transcribed and this note may contain transcription errors which may not have been corrected upon publication of note.**

## 2024-08-18 NOTE — Research (Signed)
 D7794, ICE COMPRESS: RANDOMIZED TRIAL OF LIMB CRYOCOMPRESSION VERSUS CONTINUOUS COMPRESSION VERSUS LOW CYCLIC COMPRESSION FOR THE PREVENTION OF TAXANE-INDUCED PERIPHERAL NEUROPATHY  Spoke with patient after her chemotherapy education class about the above study.  Patient states interest in the study but since she will be using the Cooling Cap device she decided to not enroll in the study since being attached to 2 different devices along with infusion pump may not be tolerable. Patient chose to use standard practice of ice bags on hands and feet during treatment.  Thanked patient for her time and considering this trial. Dr. Lanny notified.  Cherylyn Hoard, BSN, RN, [employer] Research Nurse II [phone] 08/18/2024 11:58 AM

## 2024-08-18 NOTE — Research (Signed)
 DCP-001: Use of a Clinical Trial Screening Tool to Address Cancer Health Disparities in the [employer] Community Oncology Research Program [facility])   Introduced the above study to patient explaining it is a one time data collection study for patients that were screened on [employer] sponsored trials. Patient agreed for research nurse to discuss the study with her during her chemotherapy appointment next week. Thanked patient for her time today.  Cherylyn Hoard, BSN, RN, [employer] Clinical Research Nurse II [phone] 08/18/2024 12:00 PM

## 2024-08-23 ENCOUNTER — Ambulatory Visit

## 2024-08-23 VITALS — BP 133/66 | HR 77 | Ht 69.0 in | Wt 195.0 lb

## 2024-08-23 DIAGNOSIS — I2510 Atherosclerotic heart disease of native coronary artery without angina pectoris: Secondary | ICD-10-CM | POA: Diagnosis not present

## 2024-08-23 DIAGNOSIS — Z170 Estrogen receptor positive status [ER+]: Secondary | ICD-10-CM | POA: Diagnosis not present

## 2024-08-23 DIAGNOSIS — I4892 Unspecified atrial flutter: Secondary | ICD-10-CM | POA: Diagnosis not present

## 2024-08-23 DIAGNOSIS — C50412 Malignant neoplasm of upper-outer quadrant of left female breast: Secondary | ICD-10-CM | POA: Diagnosis not present

## 2024-08-23 DIAGNOSIS — E118 Type 2 diabetes mellitus with unspecified complications: Secondary | ICD-10-CM | POA: Diagnosis not present

## 2024-08-23 NOTE — Progress Notes (Signed)
 Plastic & Reconstructive Surgery New Patient Visit  Patient: [name] MRN: [id number] Date: 08/23/2024 Surgical Oncologist: Medical Oncologist:  Reason for Consult: Breast Reconstruction   History of Present Illness:  This is a 78 y.o. woman who presents in consultation for breast reconstruction.  Her breast history is as follows:  Oncology History  Malignant neoplasm of upper-outer quadrant of left breast in female, estrogen receptor positive (HCC)  06/22/2024 Cancer Staging   Staging form: Breast, AJCC 8th Edition - Clinical stage from 06/22/2024: Stage IB (cT2, cN0, cM0, G2, ER+, PR+, HER2+) - Signed by Lanny Callander, MD on 06/30/2024 Stage prefix: Initial diagnosis Histologic grading system: 3 grade system   06/28/2024 Initial Diagnosis   Malignant neoplasm of upper-outer quadrant of left breast in female, estrogen receptor positive (HCC)   07/27/2024 Cancer Staging   Staging form: Breast, AJCC 8th Edition - Pathologic stage from 07/27/2024: Stage IA (pT2, pN0, cM0, G2, ER+, PR+, HER2+) - Signed by Lanny Callander, MD on 08/08/2024 Stage prefix: Initial diagnosis Histologic grading system: 3 grade system Residual tumor (R): R0   08/25/2024 -  Chemotherapy   Patient is on Treatment Plan : BREAST Docetaxel + Carboplatin + Trastuzumab (TCH) q21d / Trastuzumab q21d      She recently met with Dr. 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 She has seen medical oncology and they want to go ahead and begin chemotherapy before surgery. Patient will get an MRI of the right breast and she is actually considering bilateral mastectomies with reconstruction.   She currently wears a C cup bra and would ideally like to be a B or C cup.   Recon Specific Factors: Family History of Breast/Ovarian Disease?: Denies Prior XRT: Denies Need for adjuvant XRT? Denies Active  Smoker?: Denies DVT Hx/Clotting Disorder/High Risk? Denies BMI: Body mass index is 28.8 kg/m.   Family History  Problem Relation Age of Onset   CAD Father 33       died age 36   Early death Father    Heart disease Father    CAD Brother 30       small vessel disease   Early death Brother    Heart disease Brother    Atrial fibrillation Mother    Diabetes Mother    Hypertension Mother    Arthritis Mother    Hearing loss Mother    CAD Cousin        paternal   CAD Paternal Grandfather        early onset   Colon cancer Neg Hx     Past Medical History: Past Medical History:  Diagnosis Date   Arthritis    OA   Atrial flutter (HCC)    a. s/p TEE/DCCV 08/16/13 (normal LV function, no LAA thrombus).b. s/p ablation by Dr Waddell 09-23-2013   Coronary artery disease    2009 LAD Promus stent   Dysrhythmia    Aflutter, no issues after ablation 2014   Fibroid    Hyperlipidemia    Hypertension    Insulin  pump in place    Osteopenia    Type 1 diabetes mellitus on insulin  therapy (HCC)    Valvular heart disease    a. Mild MR/TR by TEE 08/2013.    Past Surgical History: Past Surgical History:  Procedure Laterality Date   ABLATION  09/23/2013   CTI by Dr Waddell   APPENDECTOMY  APPLICATION OF WOUND VAC Left 04/11/2017   Procedure: APPLICATION OF WOUND VAC LEFT KNEE;  Surgeon: Fidel Rogue, MD;  Location: [facility] OR;  Service: Orthopedics;  Laterality: Left;   ATRIAL FLUTTER ABLATION N/A 09/23/2013   Procedure: ATRIAL FLUTTER ABLATION;  Surgeon: Danelle LELON Birmingham, MD;  Location: [facility] CATH LAB;  Service: Cardiovascular;  Laterality: N/A;   CARDIOVERSION N/A 08/16/2013   Procedure: CARDIOVERSION;  Surgeon: Aleene JINNY Passe, MD;  Location: [facility] ENDOSCOPY;  Service: Cardiovascular;  Laterality: N/A;  spoke with Tom   CATARACT EXTRACTION Bilateral    COLONOSCOPY     CORONARY ANGIOPLASTY WITH STENT PLACEMENT     EYE SURGERY     Cataract   LASER ABLATION Right 11/20/2023   Right leg  laser ablation of the Greater saphenous vein with >20 stab phlebectomies   MYOMECTOMY  1977   ORIF PATELLA Left 04/11/2017   ORIF PATELLA Left 04/11/2017   Procedure: OPEN REDUCTION INTERNAL (ORIF) FIXATION PATELLA LEFT KNEE;  Surgeon: Fidel Rogue, MD;  Location: [facility] OR;  Service: Orthopedics;  Laterality: Left;   PELVIC LAPAROSCOPY     PORTACATH PLACEMENT Right 07/27/2024   Procedure: INSERTION OF TUNNELED CENTRAL VENOUS DEVICE WITH PORT;  Surgeon: Vernetta Berg, MD;  Location: [facility];  Service: General;  Laterality: Right;  PORT-A-CATH INSERTION WITH ULTRASOUND GUIDANCE   SENTINEL NODE BIOPSY Left 07/27/2024   Procedure: LEFT SENTINEL LYMPH NODE BIOPSY;  Surgeon: Vernetta Berg, MD;  Location: [facility];  Service: General;  Laterality: Left;  SENTINEL NODE BIOPSY   TEE WITHOUT CARDIOVERSION N/A 08/16/2013   Procedure: TRANSESOPHAGEAL ECHOCARDIOGRAM (TEE);  Surgeon: Aleene JINNY Passe, MD;  Location: [facility] ENDOSCOPY;  Service: Cardiovascular;  Laterality: N/A;   TUBAL LIGATION  1988    Current Medications: Current Outpatient Medications on File Prior to Visit  Medication Sig Dispense Refill   Calcium  Carbonate-Vitamin D  (CALCIUM  + D PO) Take 600 mg by mouth 2 (two) times daily.     cholecalciferol  (VITAMIN D ) 1000 units tablet Take 1,000 Units by mouth 2 (two) times daily.      clopidogrel  (PLAVIX ) 75 MG tablet TAKE 1 TABLET DAILY 90 tablet 3   Continuous Glucose Sensor (DEXCOM G7 15 DAY SENSOR) MISC by Does not apply route.     cyanocobalamin  (VITAMIN B12) 1000 MCG tablet Take 1,000 mcg by mouth daily.     dexamethasone  (DECADRON ) 4 MG tablet Take 1 tabs by mouth 2 times daily starting day before chemo. Then take 1 tabs daily for 2 days starting day after chemo. Take with food. 30 tablet 1   ezetimibe  (ZETIA ) 10 MG tablet Take 1 tablet (10 mg total) by mouth daily. 90 tablet 3   gabapentin  (NEURONTIN ) 100 MG capsule Take 2 capsules (200 mg total) by  mouth at bedtime. 180 capsule 0   Insulin  Lispro-aabc (LYUMJEV ) 100 UNIT/ML SOLN USE A MAXIMUM OF 90 UNITS DAILY VIA INSULIN  PUMP 80 mL 3   lidocaine -prilocaine (EMLA) cream Apply to affected area once 30 g 3   losartan  (COZAAR ) 100 MG tablet Take 1 tablet (100 mg total) by mouth daily. 90 tablet 3   metFORMIN  (GLUCOPHAGE -XR) 500 MG 24 hr tablet TAKE 1 TABLET IN THE MORNING AND 2 TABLETS IN THE EVENING AS DIRECTED 270 tablet 3   metoprolol  succinate (TOPROL -XL) 100 MG 24 hr tablet TAKE 1 TABLET DAILY 90 tablet 3   Multiple Vitamins-Minerals (PRESERVISION AREDS 2 PO) Take by mouth in the morning and at bedtime.  nitroGLYCERIN  (NITROSTAT ) 0.4 MG SL tablet DISSOLVE 1 TABLET UNDER THE TONGUE EVERY 5 MINUTES AS NEEDED FOR CHEST PAIN 75 tablet 2   ondansetron  (ZOFRAN ) 8 MG tablet Take 1 tablet (8 mg total) by mouth every 8 (eight) hours as needed for nausea or vomiting. Start on the third day after chemotherapy. 30 tablet 1   prochlorperazine (COMPAZINE) 10 MG tablet Take 1 tablet (10 mg total) by mouth every 6 (six) hours as needed for nausea or vomiting. 30 tablet 1   Pyridoxine HCl (VITAMIN B6) 100 MG TABS      rosuvastatin  (CRESTOR ) 40 MG tablet Take 1 tablet (40 mg total) by mouth daily. 90 tablet 3   solifenacin  (VESICARE ) 10 MG tablet Take 1 tablet (10 mg total) by mouth daily. 90 tablet 3   spironolactone  (ALDACTONE ) 25 MG tablet Take 1 tablet (25 mg total) by mouth daily. 90 tablet 3   TART CHERRY PO Take by mouth.     traMADol  (ULTRAM ) 50 MG tablet Take 1 tablet (50 mg total) by mouth every 6 (six) hours as needed for moderate pain (pain score 4-6) or severe pain (pain score 7-10). 25 tablet 0   No current facility-administered medications on file prior to visit.    Allergies: Allergies  Allergen Reactions   Sulfa Antibiotics Rash   Sulfamethoxazole-Trimethoprim Rash    Family History:  No family history is negative for bleeding/clotting disorders, problems with anesthesia,  connective tissue disorders.   Social History:  Social History   Socioeconomic History   Marital status: Married    Spouse name: [name]   Number of children: 0   Years of education: 14   Highest education level: Bachelor's degree (e.g., BA, AB, BS)  Occupational History   Occupation: home maker    Comment: Children adopted  Tobacco Use   Smoking status: Never    Passive exposure: Never   Smokeless tobacco: Never  Vaping Use   Vaping status: Never Used  Substance and Sexual Activity   Alcohol  use: Yes    Alcohol /week: 3.0 - 4.0 standard drinks of alcohol     Types: 3 - 4 Glasses of wine per week    Comment: on weekends   Drug use: Never   Sexual activity: Yes    Birth control/protection: Post-menopausal  Other Topics Concern   Not on file  Social History Narrative   Not on file   Social Drivers of Health   Financial Resource Strain: Low Risk  (01/27/2024)   Overall Financial Resource Strain (CARDIA)    Difficulty of Paying Living Expenses: Not hard at all  Food Insecurity: No Food Insecurity (06/30/2024)   Hunger Vital Sign    Worried About Running Out of Food in the Last Year: Never true    Ran Out of Food in the Last Year: Never true  Transportation Needs: No Transportation Needs (06/30/2024)   PRAPARE - Administrator, Civil Service (Medical): No    Lack of Transportation (Non-Medical): No  Physical Activity: Sufficiently Active (01/27/2024)   Exercise Vital Sign    Days of Exercise per Week: 7 days    Minutes of Exercise per Session: 30 min  Recent Concern: Physical Activity - Insufficiently Active (10/31/2023)   Exercise Vital Sign    Days of Exercise per Week: 3 days    Minutes of Exercise per Session: 30 min  Stress: No Stress Concern Present (01/27/2024)   [name] of Occupational Health - Occupational Stress Questionnaire    Feeling of  Stress : Not at all  Social Connections: Socially Integrated (01/27/2024)   Social Connection and Isolation  Panel    Frequency of Communication with Friends and Family: More than three times a week    Frequency of Social Gatherings with Friends and Family: More than three times a week    Attends Religious Services: More than 4 times per year    Active Member of [employer] or Organizations: Yes    Attends Engineer, structural: More than 4 times per year    Marital Status: Married   She is a never smoker. Denies recreational drug use.   Review of systems: 10 point review of systems performed and negative except as noted in the HPI.  Physical Exam: BP 133/66   Pulse 77   Ht 5' 9 (1.753 m)   Wt 195 lb (88.5 kg)   SpO2 100%   BMI 28.80 kg/m  Body mass index is 28.8 kg/m. General: Well appearing, no apparent distress. Pulm: Breathing comfortably on room air without sounds/wheezing. CV: Regular rate. Good perfusion of extremities. Chest: Chest wall without abnormality or obvious deformity or asymmetry.  Breast: Bilateral grade 3 ptosis. Breasts are overall symmetric with regards to shape and contour. Left breast appears larger than right. No palpable masses in either breast. Bilateral NAC viable and sensate. Papules everted without discharge. NACs positioned along breast meridian bilaterally. Skin quality is good. There are prior incisions on left from lumpectomy.  - Breast measurements:  Measurements  Right Breast (cm)  Left Breast (cm)   Base Width 12 12  SN - Nipple 26 29  Nipple - IMF 8 6.5  NAC diameter 3.5*4.5 7*6.5  Internipple Distance 22  Abdomen: Soft, nondistended, nontender to palpation. No palpable umbilical hernia.  There are prior incisions. Skin quality is poor. There is sufficient skin and subcutaneous tissue for reconstruction of breast to about A-B cup.  Neuro: Moving all four extremities spontaneously.  Psych: Appropriate mood and affect.   Labs: Recent Results (from the past 2160 hours)  HM MAMMOGRAPHY     Status: None   Collection Time: 06/08/24  8:58 AM   Result Value Ref Range   HM Mammogram 0-4 Bi-Rad 0-4 Bi-Rad, Self Reported Normal    Comment: Abstracted by HIM  Surgical pathology     Status: None   Collection Time: 06/22/24 12:00 AM  Result Value Ref Range   SURGICAL PATHOLOGY      SURGICAL PATHOLOGY [facility] [address] [place], [place] [address] Telephone [phone] or [phone] Fax [phone]  REPORT OF SURGICAL PATHOLOGY   Accession #: [id number] Patient Name: [name], [name] Visit # :   MRN: [id number] Physician: Vonzell Knee DOB/Age 04/16/1946 (Age: 39) Gender: F Collected Date: 06/22/2024 Received Date: 06/22/2024  FINAL DIAGNOSIS       1. Breast, left, needle core biopsy, 12 o'clock, 5cmfn :       - INVASIVE MAMMARY CARCINOMA, AN IMMUNOHISTOCHEMICAL STAIN FOR E-CADHERIN IS      PENDING AND WILL BE REPORTED IN AN ADDENDUM      -  MAMMARY CARCINOMA IN SITU, NUCLEAR GRADE 2 OF 3      - TUBULE FORMATION: SCORE 3/3      - NUCLEAR PLEOMORPHISM: SCORE 2/3      - MITOTIC COUNT: SCORE 1/3      - TOTAL SCORE: 6/9      - OVERALL GRADE: II/III      - LYMPHOVASCULAR INVASION: NOT IDENTIFIED      -  CANCER LENGTH: 5 MM IN GREATEST LINEAR DIMENSION ON FRAGMENTED CORES      - CALCIFICATIONS: NOT  IDENTIFIED      - OTHER FINDINGS: N/A      NOTE:      DR. REBBECCA REVIEWED THE CASE AND CONCURS WITH THE INTERPRETATION.  A BREAST      PROGNOSTIC PROFILE (ER, PR, KI-67 AND HER2) IS PENDING AND WILL BE REPORTED IN      AN ADDENDUM.  [facility] WAS NOTIFIED ON 06/23/2024.       DATE SIGNED OUT: 06/23/2024 ELECTRONIC SIGNATURE : Legolvan Do, Mark, Pathologist, Electronic Signature  MICROSCOPIC DESCRIPTION  CASE COMMENTS STAINS USED IN DIAGNOSIS: H&E-2 H&E-3 H&E-4 H&E *RECUT 1 SLIDE Universal Negative Control-DAB E-CAD Stains used in diagnosis 1 Her2 by IHC, 1 ER-ACIS, 1 KI-67-ACIS, 1 PR-ACIS IHC scores are reported using ASCO/CAP scoring criteria.  An IHC  Score of 0 or 1+  is NEGATIVE for HER2, 3+ is POSITIVE for HER2, and 2+ is EQUIVOCAL. Equivocal results are reflexed to either FISH or IHC testing. Specimens are fixed in 10% Neutral Buffered Formalin for at least 6 hours and up to 72 hours. These tests have not be validated on decalcified tiss ue.  Results should be interpreted with caution given the possibility of false negative results on decalcified specimens. Antibody Clone for HER2 is 4B5 (PATHWAY). Some of these immunohistochemical stains may have been developed and the performance characteristics determined by [facility].  Some may not have been cleared or approved by the U.S. Food and Drug Administration.  The FDA has determined that such clearance or approval is not necessary.  This test is used for clinical purposes.  It should not be regarded as investigational or for research.  This laboratory is certified under the Clinical Laboratory Improvement Amendments of 1988 (CLIA-88) as qualified to perform high complexity clinical laboratory testing. Estrogen receptor (6F11), immunohistochemical stains are performed on formalin fixed, paraffin embedded tissue using a 3,3-diaminobenzidine (DAB) chromogen and Leica Bond Autostainer System.  The staining intensity of the nucleus is scored manua lly and is reported as the percentage of tumor cell nuclei demonstrating specific nuclear staining.Specimens are fixed in 10% Neutral Buffered Formalin for at least 6 hours and up to 72 hours.  These tests have not be validated on decalcified tissue.  Results should be interpreted with caution given the possibility of false negative results on decalcified specimens. Ki-67 (MM1), immunohistochemical stains are performed on formalin fixed, paraffin embedded tissue using a 3,3-diaminobenzidine (DAB) chromogen and Leica Bond Autostainer System.  The staining intensity of the nucleus is scored manually and is reported as the percentage  of tumor cell nuclei demonstrating specific nuclear staining.Specimens are fixed in 10% Neutral Buffered Formalin for at least 6 hours and up to 72 hours. These tests have not be validated on decalcified tissue.  Results should be interpreted with caution given the possibility of false negative results on decalcified specimens. PR progesterone r eceptor (16), immunohistochemical stains are performed on formalin fixed, paraffin embedded tissue using a 3,3-diaminobenzidine (DAB) chromogen and Leica Bond Autostainer System.  The staining intensity of the nucleus is scored manually and is reported as the percentage of tumor cell nuclei demonstrating specific nuclear staining.Specimens are fixed in 10% Neutral Buffered Formalin for at least 6 hours and up to 72 hours. These tests have not be validated on decalcified tissue.  Results should be interpreted with caution given the possibility of false negative results on decalcified specimens.  ADDENDUM  Breast, left, needle core biopsy PROGNOSTIC INDICATORS Results: IMMUNOHISTOCHEMICAL AND MORPHOMETRIC ANALYSIS PERFORMED MANUALLY The tumor cells are positive for Her2 (3+). Estrogen Receptor:  100%, positive, strong staining intensity Progesterone Receptor:  95%, positive, moderate to strong staining Proliferation Marker Ki67: 3% COMMENT:  The negative hormone recept or study(ies) in this case has an internal positive control.  REFERENCE RANGE ESTROGEN RECEPTOR NEGATIVE     0% POSITIVE       =>1% REFERENCE RANGE PROGESTERONE RECEPTOR NEGATIVE     0% POSITIVE        =>1% All controls stained appropriately Pepper Dutton Md, Pathologist, Electronic Signature ( Signed 08 21 2025) An E-cadherin stain shows loss of membranous staining both in the invasive and in situ components consistent with invasive lobular carcinoma and lobular carcinoma in situ. Legolvan Do, Mark, Pathologist, Electronic Signature ( Signed [phone])   CLINICAL  HISTORY  SPECIMEN(S) OBTAINED 1. Breast, left, needle core biopsy, 12 O'clock, 5cmfn  SPECIMEN COMMENTS: 1. TIF: 43:102 PM; 78 year old female with 3.5cm irregular mass left breast 12:00 - 1:00 5cmfn SPECIMEN CLINICAL INFORMATION: 1. C/F invasive lobular vs ductal carcinoma    Gross Description 1. Received in formalin labeled with the patient's name [name]) and LT br are four pieces of yell ow-tan fibrofatty tissue ranging from 0.4 x 0.2 x 0.2 cm to 1.0 x 0.2 x 0.2 cm, submitted in toto in a single cassette. TIF 13:27, CIT 1 minute.               (LEF, 06/22/2024)        Report signed out from the following location(s) [place] [address] [place], [place] [address] CLIA #: [id number]  [facility] [address] [place], [place] [address] CLIA #: [id number]   Genetic Screening Order     Status: None   Collection Time: 06/30/24  1:29 PM  Result Value Ref Range   Genetic Screening Order Collected by Laboratory     Comment: Performed at [facility], [address], [place], [place] [address]  CMP (Cancer Center only)     Status: Abnormal   Collection Time: 06/30/24  1:29 PM  Result Value Ref Range   Sodium 140 135 - 145 mmol/L   Potassium 4.2 3.5 - 5.1 mmol/L   Chloride 102 98 - 111 mmol/L   CO2 26 22 - 32 mmol/L   Glucose, Bld 90 70 - 99 mg/dL    Comment: Glucose reference range applies only to samples taken after fasting for at least 8 hours.   BUN 17 8 - 23 mg/dL   Creatinine 9.16 9.55 - 1.00 mg/dL   Calcium  9.9 8.9 - 10.3 mg/dL   Total Protein 7.3 6.5 - 8.1 g/dL   Albumin 4.4 3.5 - 5.0 g/dL   AST 40 15 - 41 U/L   ALT 52 (H) 0 - 44 U/L   Alkaline Phosphatase 106 38 - 126 U/L   Total Bilirubin 0.5 0.0 - 1.2 mg/dL   GFR, Estimated >39 >39 mL/min    Comment: (NOTE) Calculated using the CKD-EPI Creatinine Equation (2021)    Anion gap 12 5 - 15    Comment: Performed at [facility], [address] [place], [place] [address]  CBC with Differential (Cancer Center Only)     Status: Abnormal   Collection Time: 06/30/24  1:29 PM  Result Value Ref Range   WBC Count 6.2 4.0 -  10.5 K/uL   RBC 5.28 (H) 3.87 - 5.11 MIL/uL   Hemoglobin 15.8 (H) 12.0 - 15.0 g/dL   HCT 52.9 (H) 63.9 - 53.9 %   MCV 89.0 80.0 - 100.0 fL   MCH 29.9 26.0 - 34.0 pg   MCHC 33.6 30.0 - 36.0 g/dL   RDW 87.2 88.4 - 84.4 %   Platelet Count 331 150 - 400 K/uL   nRBC 0.0 0.0 - 0.2 %   Neutrophils Relative % 64 %   Neutro Abs 4.0 1.7 - 7.7 K/uL   Lymphocytes Relative 20 %   Lymphs Abs 1.3 0.7 - 4.0 K/uL   Monocytes Relative 12 %   Monocytes Absolute 0.8 0.1 - 1.0 K/uL   Eosinophils Relative 3 %   Eosinophils Absolute 0.2 0.0 - 0.5 K/uL   Basophils Relative 1 %   Basophils Absolute 0.1 0.0 - 0.1 K/uL   Immature Granulocytes 0 %   Abs Immature Granulocytes 0.01 0.00 - 0.07 K/uL    Comment: Performed at [facility], [address], [place], [place] [address]  ECHOCARDIOGRAM COMPLETE     Status: None   Collection Time: 07/02/24 11:47 AM  Result Value Ref Range   S' Lateral 3.30 cm   Single Plane A4C EF 56.2 %   Single Plane A2C EF 61.3 %   Calc EF 58.3 %   Area-P 1/2 3.03 cm2   Est EF 60 - 65%   RESEARCH LABS     Status: None   Collection Time: 07/20/24  2:39 PM  Result Value Ref Range   [facility] DRAWN BY RN     Comment: Performed at [facility], [address], [place], [place] [address]  Glucose, capillary     Status: Abnormal   Collection Time: 07/27/24 11:18 AM  Result Value Ref Range   Glucose-Capillary 110 (H) 70 - 99 mg/dL    Comment: Glucose reference range applies only to samples taken after fasting for at least 8 hours.  Surgical pathology     Status: None   Collection Time: 07/27/24  1:01 PM  Result Value Ref Range   SURGICAL PATHOLOGY      SURGICAL PATHOLOGY CASE: [id number] PATIENT: [name] Surgical Pathology Report     Clinical History: left breast cancer (cm)     FINAL MICROSCOPIC DIAGNOSIS:  A. 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 LYMPH NODE, LEFT AXILLARY, SENTINEL, EXCISION:      One lymph node, negative for metastatic carcinoma (0/1).  C. LYMPH NODE, LEFT AXILLARY, SENTINEL, EXCISION:      One lymph node, negative for metastatic carcinoma (0/1).  D. LYMPH NODE, LEFT A XILLARY, SENTINEL, EXCISION:      One lymph node, negative for metastatic carcinoma (0/1).  E. LYMPH NODE, LEFT AXILLARY, SENTINEL, EXCISION:      One lymph node, negative for metastatic carcinoma (0/1).  ONCOLOTY  TABLE:  INVASIVE CARCINOMA OF THE BREAST, STATUS POST NEOADJUVANT TREATMENT: Resection  Procedure: Lumpectomy Specimen Laterality: Left Histologic Type: Invasive lobular carcinoma Histologic Grade:      Glandular (Acinar)/Tubular Differentiation: 3      Nuclear Pleomorphism: 3      Mitotic Rate: 1      Overall Grade: 2 Tumor Size: 3.2 cm Lobular Carcinoma In Situ: Extensive, pleomorphic, high nuclear grade Tumor Extent: Limited to breast parenchyma Margins: All margins negative for invasive carcinoma      Distance from Closest Margin (mm): > 10 mm from all margins      Specify Closest Margin (required only if <24mm): > 10 mm from all margins LCIS Margins: Positive      Distance from Closest Margin (mm): 0      Specify Closest Margin (required only  if  <30mm): Anterior, superior, lateral, posterior margins Regional Lymph Nodes:      Number of Lymph Nodes Examined: 4      Number of Sentinel Nodes Examined: 4      Number of Lymph Nodes with Macrometastases (>2 mm): 0      Number of Lymph Nodes with Micrometastases: 0      Number of Lymph Nodes with Isolated Tumor Cells (=0.2 mm or =200 cells): 0      Size of Largest Metastatic Deposit (millimeters): NA      Extranodal Extension: NA Distant Metastasis:      Distant Site(s) Involved: Not applicable Breast Prognostic Profile ([id number])      Estrogen Receptor: 100%, positive, strong staining intensity      Progesterone Receptor: 95%, positive, moderate to strong staining intensity      Her2: Positive, 3+      Ki-67: 3%      Pathologic Stage Classification (pTNM, AJCC 8th Edition): pT2, pN0 Representative Tumor Block: A1, A3, A4 Comment(s): None (v4.5.0.0)     GROSS DESCRIPTION:  Specimen A: Left breast lumpectomy, received fresh and placed in formalin at  1419 on 07/27/2024 (CIT 1 hour 18 minutes) Size: 5 cm from medial to lateral, 4.6 cm from superior to inferior, 2.8 cm from anterior to posterior. Orientation: Green Anterior, Blue Inferior, Orange Lateral, Yellow Medial, Black Posterior, Red Superior. Localized area: None Cut surface: A Magseed is found.  There is a 3.2 x 2.1 x 1.7 cm area of pink-white to yellow-red indurated and diffusely nodular tissue which contains a coil clip.  A discrete mass lesion is not identified. Margins: Indurated and nodular tissue around the clip abuts anterior, superior and lateral margins and is widely free (greater than 1 cm) from posterior, anterior and medial margins. Prognostic indicators: Taken from paraffin block if needed Block summary: Blocks 1-7 = tissue at and around clip, without margin Blocks 8-10 = tissue around clip abutting anterior margin Blocks 11, 12 = tissue around clip abutting superior margin Blocks 13, 14 = tissue  around clip abutting lateral margin Blocks 15, 16  = posterior margin nearest tissue clip Block 17 = inferior margin nearest tissue with clip Block 18 = medial margin nearest tissue with clip  Specimen B: Received fresh is a 0.7 cm tan-pink soft nodule which is bisected and submitted 1 block.  Specimen C: Received fresh is a 0.6 cm tan-pink soft nodule which is bisected and submitted in 1 block.  Specimen D: Received fresh is a 1 cm tan-pink soft nodule which is bisected and submitted in 1 block.  Specimen E: Received fresh is a 0.5 cm yellow-pink soft nodule  which is bisected and submitted in 1 block.  SW 07/28/2024   Final Diagnosis performed by Pepper Dutton, MD.   Electronically signed 07/30/2024 Technical component performed at [facility]. [facility], [address] [place], [place] [address].  Professional component performed at [facility]. [address], [place], [place] [address]  Immunohistochemistry Technical component (if applicable) was performed at [facility]. [address], [place], [place] [address].  IMMUNOHISTOCHEMISTRY DISCLAIMER (if applicable): Some of these immunohistochemical stains may have been developed and the performance characteristics determine by [facility]. Some may not have been cleared or approved by the U.S. Food and Drug Administration. The FDA has determined that such clearance or approval is not necessary. This test is used for clinical purposes. It should not be regarded as investigational or for research. This laboratory is certified under the Clinical Laboratory Improvement Amendments of 1988 (CLIA-88) as qualified to perform high complexity clinical laboratory testing.  The controls stained appropriately.   Glucose, capillary     Status: Abnormal   Collection Time: 07/27/24  1:53 PM  Result Value Ref Range   Glucose-Capillary 105 (H) 70 - 99 mg/dL     Comment: Glucose reference range applies only to samples taken after fasting for at least 8 hours.  C-peptide     Status: Abnormal   Collection Time: 07/29/24  8:11 AM  Result Value Ref Range   C-Peptide 0.21 (L) 0.80 - 3.85 ng/mL  Microalbumin / creatinine urine ratio     Status: None   Collection Time: 07/29/24  8:11 AM  Result Value Ref Range   Creatinine, Urine 97 20 - 275 mg/dL   Microalb, Ur <9.7 mg/dL    Comment: Reference Range Not established    Microalb Creat Ratio NOTE <30 mg/g creat    Comment: NOTE: The urine albumin value is less than  0.2 mg/dL therefore we are unable to calculate  excretion and/or creatinine ratio. . The ADA defines abnormalities in albumin excretion as follows: SABRA Albuminuria Category        Result (mg/g creatinine) . Normal to Mildly increased   <30 Moderately increased         30-299  Severely increased           > OR = 300 . The ADA recommends that at least two of three specimens collected within a 3-6 month period be abnormal before considering a patient to be within a diagnostic category.   Basic Metabolic Panel Without GFR     Status: None   Collection Time: 07/29/24  8:11 AM  Result Value Ref Range   Glucose, Bld 83 65 - 99 mg/dL    Comment: .            Fasting reference interval .    BUN 17 7 - 25 mg/dL   Creat 9.31 9.39 - 8.99 mg/dL   BUN/Creatinine Ratio SEE NOTE: 6 - 22 (calc)    Comment:    Not Reported: BUN and Creatinine are within    reference range. .    Sodium 142 135 - 146 mmol/L   Potassium 4.6 3.5 - 5.3 mmol/L   Chloride 107 98 - 110 mmol/L   CO2 27 20 - 32 mmol/L   Calcium  9.1 8.6 - 10.4 mg/dL  Hemoglobin J8r     Status: Abnormal   Collection Time: 07/29/24  8:11 AM  Result Value Ref Range   Hgb A1c MFr Bld 7.1 (H) <  5.7 %    Comment: For someone without known diabetes, a hemoglobin A1c value of 6.5% or greater indicates that they may have  diabetes and this should be confirmed with a follow-up   test. . For someone with known diabetes, a value <7% indicates  that their diabetes is well controlled and a value  greater than or equal to 7% indicates suboptimal  control. A1c targets should be individualized based on  duration of diabetes, age, comorbid conditions, and  other considerations. . Currently, no consensus exists regarding use of hemoglobin A1c for diagnosis of diabetes for children. .    Mean Plasma Glucose 157 mg/dL   eAG (mmol/L) 8.7 mmol/L     Imaging/Pathology:  Assessment: In summary, this is a pleasant 78 y.o. year-old woman with PMH and PSH as described above and newly diagnosed breast cancer presenting for consultation for breast reconstruction in anticipation of mastectomy with Dr. Vernetta.  I had a long discussion with the patient regarding her options for breast reconstruction. All this information discussed is included in a patient education document given to the patient.   The patient was advised that timing of reconstruction can be either immediate (at the time of mastectomy), or delayed (at a separate operation after the mastectomy), and there are advantages and disadvantages to both, depending upon the need for additional cancer treatment. We discussed that breast reconstruction has limitations. We discussed that a reconstruction can have shape issues, be painful and is largely insensate. It is never the same as a natural breast. The general surgical risks discussed included wound infections, bleeding, scarring, chronic pain, fluid build up (seroma), and wound breakdown needing wound care, sometimes even a wound vac. Death can occur with any surgery. Sometimes a blood clot (DVT) can develop that may travel to the lungs (PE) and can be fatal. The mastectomy can have skin loss not infrequently and this can have negative implications for our reconstruction results both aesthetically and complications wise, especially increasing the risk of implant removal  and the need for skin removal leading to poor aesthetics. Sometimes chronic pain syndromes can ensue that are difficult to manage. Aesthetic outcomes were discussed in detail and the patient understands that asymmetries are common, aesthetic disappointment is possible and that revision rates are very high after all types of reconstruction to try get the result as best as possible. The patient was told that there are limitations of reconstruction, limited sometimes by the patient's characteristics and how they heal and that outcomes sometimes are unpredictable.  1. Implant based reconstruction:  The usual process of placing an expander and postoperative care was discussed. We also discussed the option of DTI (direct to implant) reconstruction. We discussed the potential use of "graft" material and the potential of a slight increase in infection. We contrasted this to the benefits of its use also. We compared and contrasted the different implants and the FDA concerns and recommendations. Specifically the patient understands that silicone implants are not implicated in autoimmune diseases but that there is a potential risk of a condition called ALCL, a lymphoma type condition that occurs particularly with the use of textures implants. The FDA and [employer]PS has not changed their stance on implants. Surgical risks besides the risks above, that were discussed, included the following, infection leading to possible loss of the expander/implant, chronic pain, aesthetic complications such as rippling, asymmetry and animation (unwanted jumping or squashing of the implant when the chest muscle contracts). We also discussed the common complication of capsular  contracture that often will need revisional surgery and can be painful. We quoted national data from our society of 40% revision rates at 7 years and that revisions in other two stage or DTI reconstruction was common. We discussed pre-pectoral reconstruction, placing the  devices in front of the muscle, and that this is fairly new. We and others think it has many benefits including preventing the animation deformity and postoperative pain is much improved. The deleterious effects of radiation were discussed and that in this scenario we would only consider expanders, not direct to implant and not a flap initially. It is possible to do reconstruction but the patient understands that this has a high rate of complications.  After radiation in delayed reconstruction scenarios, a flap such as a DIEP or a back (LDMF) flap are most often needed. The risks associated with implant based breast reconstruction were discussed including: infection, seroma, hematoma, skin flap/nipple necrosis, change/loss of breast or nipple sensation, implant failure, silicone concerns, capsular contracture, Anaplastic large cell lymphoma, asymmetry, the need for secondary and tertiary procedures, donor site morbidity, scars, need for possible drains and postoperative antibiotics, fat necrosis, pain, reconstruction failure, DVT/PE, stroke and heart attack.   2. DIEP Autologous tissue reconstruction with a DIEP (tummy fat and skin) flap was described and discussed in detail. For patients with adequate fatty tissue of the lower abdomen this is a good option. The usual postoperative recovery of 6-8 weeks and sometimes longer was discussed and the usual postoperative course was discussed.  Skin loss from the mastectomy, which is not uncommon, can affect our reconstruction efforts negatively by forcing us  to use a much bigger skin island, having higher risks of infection and skin breakdown for example and even compromising the flap survival. Risks associated with autologous based breast reconstruction were discussed including: bleeding, infection, seroma, scarring, vessel thrombosis, partial or total flap loss, asymmetry, need for secondary and tertiary procedures, fat necrosis, DVT/PE, stroke and heart attack.  During surgery, the mastectomy flaps are interrogated to determine their vascularity and perfusion status. Should the mastectomy flaps show sign of compromise, an intraoperative decision may be made to delay reconstruction. Risks discussed over and above the usual risks discussed included but were not limited to complete flap failure and the need for another type of reconstruction, chronic pain, abdominal bulge or hernia, scarring, skin breakdown, seroma (fluid build up); all sometimes needing further intervention including surgery. Hard areas (fat necrosis) can develop which are only dealt with if they are noticeable or painful. Wound complications are common and very common in ladies that have a higher BMI. I discussed this risk as being almost 100% but that the flap is still a good option given that implants have high risks also in these patients. Bleeding, infection and blood clots that could travel to the lungs are possible and walking after surgery is very important to help prevent these clots. Asymmetries, irregularities, size issues, projection issues and other shape concerns can arise and often do, that sometimes are amenable to revisions but sometimes are just a limitation of the outcome. We discussed that symmetry procedures are often necessary and are part of the reconstructive process.   3. The Latissimus Dorsi flap (Back Muscle Flap) with an expander was discussed. The patient understands the procedure, postop course and use of the different types of implants. The risk, discussed are similar to the other types of implant, expander based reconstruction except that seroma (fluid build up) risk of 18% was highlighted. This sometimes can require  further surgery. Sometimes there is a risk of decreased shoulder mobility and chronic pain is possible but this is usually well tolerated form of reconstruction.   4. LICAP/Goldilocks We discussed breast reconstruction with local tissue. Specifically, The  Goldilock's procedure with possible LICAP flaps for additional volume. We discussed the limitations of this technique including that the viability of LICAP flaps could be compromised by the mastectomy itself. We discussed the possibility of revision operations with fat grafting and/or implants in the future if she desires more volume. I would plan to doppler out the LICAP perforators preoperatively if possible to avoid injury during mastectomy.   5. We also discussed the [facility] Health Act of 1998. All the questions were answered. The patient has all the information to make an informed decision and has a good understanding of risks, benefits and limitations.  She has all the prerequisite information to make an informed decision.   6. Opioids: With regard to opioid prescriptions, the laws have been discussed, the patient has been thoroughly evaluated. We often use a multi modal approach to pain management. If further unanticipated prescribing of opioids is needed, the patient will need to visit her primary doctor or a pain specialist.      In summary, this is a very pleasant 78 y.o. F with newly diagnosed breast who presents to discuss breast reconstruction in anticipation of mastectomies with Dr. Malcolm. She will not require adjuvant radiation therapy.    We have agreed upon staged reconstruction with the first stage being immediate bilateral tissue expander placement with ADM/Mesh. I will place in prepectoral plane if the soft tissue supports it. Otherwise I will place subpectorally or not at all. The incision pattern to be discussed with surgical oncology.    The time documented represents the total time spent on the day of the encounter in preparing for and completing the visit. It does not include time spent by ancillary staff, a resident, a fellow, another trainee, or, for shared visits, time spent jointly with the patient or discussing the case or the performance of other separately performed  services.   Time spent: 45 minutes.    Dietrick Barris, MD [facility]   08/23/2024 12:45 PM

## 2024-08-24 ENCOUNTER — Inpatient Hospital Stay

## 2024-08-24 ENCOUNTER — Inpatient Hospital Stay: Admitting: Hematology

## 2024-08-24 ENCOUNTER — Encounter: Payer: Self-pay | Admitting: Hematology

## 2024-08-24 VITALS — BP 134/64 | HR 91 | Temp 97.9°F | Resp 16 | Ht 69.0 in | Wt 197.3 lb

## 2024-08-24 DIAGNOSIS — Z5111 Encounter for antineoplastic chemotherapy: Secondary | ICD-10-CM | POA: Diagnosis not present

## 2024-08-24 DIAGNOSIS — Z1721 Progesterone receptor positive status: Secondary | ICD-10-CM | POA: Diagnosis not present

## 2024-08-24 DIAGNOSIS — Z170 Estrogen receptor positive status [ER+]: Secondary | ICD-10-CM | POA: Diagnosis not present

## 2024-08-24 DIAGNOSIS — Z1731 Human epidermal growth factor receptor 2 positive status: Secondary | ICD-10-CM | POA: Diagnosis not present

## 2024-08-24 DIAGNOSIS — C50412 Malignant neoplasm of upper-outer quadrant of left female breast: Secondary | ICD-10-CM | POA: Diagnosis not present

## 2024-08-24 DIAGNOSIS — Z5189 Encounter for other specified aftercare: Secondary | ICD-10-CM | POA: Diagnosis not present

## 2024-08-24 LAB — BASIC METABOLIC PANEL WITH GFR
BUN/Creatinine Ratio: 21 (ref 12–28)
BUN: 16 mg/dL (ref 8–27)
CO2: 20 mmol/L (ref 20–29)
Calcium: 10.2 mg/dL (ref 8.7–10.3)
Chloride: 100 mmol/L (ref 96–106)
Creatinine, Ser: 0.78 mg/dL (ref 0.57–1.00)
Glucose: 109 mg/dL — ABNORMAL HIGH (ref 70–99)
Potassium: 5.3 mmol/L — ABNORMAL HIGH (ref 3.5–5.2)
Sodium: 140 mmol/L (ref 134–144)
eGFR: 79 mL/min/1.73 (ref >59)

## 2024-08-24 LAB — CBC WITH DIFFERENTIAL (CANCER CENTER ONLY)
Abs Immature Granulocytes: 0.03 K/uL (ref 0.00–0.07)
Basophils Absolute: 0.1 K/uL (ref 0.0–0.1)
Basophils Relative: 1 %
Eosinophils Absolute: 0.1 K/uL (ref 0.0–0.5)
Eosinophils Relative: 1 %
HCT: 41.1 % (ref 36.0–46.0)
Hemoglobin: 14.1 g/dL (ref 12.0–15.0)
Immature Granulocytes: 1 %
Lymphocytes Relative: 13 %
Lymphs Abs: 0.8 K/uL (ref 0.7–4.0)
MCH: 30 pg (ref 26.0–34.0)
MCHC: 34.3 g/dL (ref 30.0–36.0)
MCV: 87.4 fL (ref 80.0–100.0)
Monocytes Absolute: 0.4 K/uL (ref 0.1–1.0)
Monocytes Relative: 6 %
Neutro Abs: 4.7 K/uL (ref 1.7–7.7)
Neutrophils Relative %: 78 %
Platelet Count: 277 K/uL (ref 150–400)
RBC: 4.7 MIL/uL (ref 3.87–5.11)
RDW: 12.5 % (ref 11.5–15.5)
WBC Count: 6 K/uL (ref 4.0–10.5)
nRBC: 0 % (ref 0.0–0.2)

## 2024-08-24 LAB — CMP (CANCER CENTER ONLY)
ALT: 37 U/L (ref 0–44)
AST: 29 U/L (ref 15–41)
Albumin: 4.1 g/dL (ref 3.5–5.0)
Alkaline Phosphatase: 85 U/L (ref 38–126)
Anion gap: 5 (ref 5–15)
BUN: 17 mg/dL (ref 8–23)
CO2: 29 mmol/L (ref 22–32)
Calcium: 9.9 mg/dL (ref 8.9–10.3)
Chloride: 105 mmol/L (ref 98–111)
Creatinine: 0.68 mg/dL (ref 0.44–1.00)
GFR, Estimated: >60 mL/min (ref >60)
Glucose, Bld: 68 mg/dL — ABNORMAL LOW (ref 70–99)
Potassium: 4.3 mmol/L (ref 3.5–5.1)
Sodium: 139 mmol/L (ref 135–145)
Total Bilirubin: 0.4 mg/dL (ref 0.0–1.2)
Total Protein: 6.8 g/dL (ref 6.5–8.1)

## 2024-08-24 NOTE — Assessment & Plan Note (Signed)
 pT2N0M0, stage IA,G2 invasive lobular carcinoma, triple positive -Discovered during screening mammogram. 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  4 sentinel lymph nodes were negative.  Margins were negative for invasive carcinoma, but positive for LCIS. -I recommend adjuvant chemotherapy and anti-HER2 therapy TCH every 3 weeksX6 followed by trastuzumab maintenance therapy to complete a 1 year therapy.

## 2024-08-24 NOTE — Progress Notes (Signed)
 [facility]   [phone] [phone]   Clinic Follow up Note   Patient Care Team: Rollene Almarie LABOR, MD as PCP - General (Internal Medicine) Lavona Agent, MD as PCP - Cardiology (Cardiology) [name], Aleene PARAS, MD (Inactive) (Cardiology) Dwane Lerner, MD (Dermatology) Waddell Danelle ORN, MD (Cardiology) Obie Princella HERO, MD (Inactive) (Gastroenterology) Claudene Arthea HERO, DO (Sports Medicine) [place], Heather, OD as Consulting Physician (Optometry) Szabat, Toribio BROCKS, [facility] (Inactive) (Pharmacist) [name], Pasco RAMAN, DPM as Consulting Physician (Podiatry) Thapa, [place], MD as Consulting Physician (Endocrinology) Gerome, Devere HERO, RN as Oncology Nurse Navigator Tyree Nanetta SAILOR, RN as Oncology Nurse Navigator Vernetta Berg, MD as Consulting Physician (General Surgery) Lanny Callander, MD as Consulting Physician (Hematology) Dewey Rush, MD as Consulting Physician (Radiation Oncology)  Date of Service:  08/24/2024  CHIEF COMPLAINT: f/u of left breast cancer  CURRENT THERAPY:  Adjuvant chemotherapy TCH every 3 weeks  Oncology History   Malignant neoplasm of upper-outer quadrant of left breast in female, estrogen receptor positive (HCC) pT2N0M0, stage IA,G2 invasive lobular carcinoma, triple positive -Discovered during screening mammogram. 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  4 sentinel lymph nodes were negative.  Margins were negative for invasive carcinoma, but positive for LCIS. -I recommend adjuvant chemotherapy and anti-HER2 therapy TCH every 3 weeksX6 followed by trastuzumab maintenance therapy to complete a 1 year therapy.  Assessment & Plan Malignant neoplasm of upper-outer quadrant of left breast with concurrent lobular carcinoma in situ She has a malignant neoplasm of the upper-outer quadrant of the left breast with concurrent lobular carcinoma in  situ (LCIS). The invasive cancer has been removed, and the lymph nodes are clear. LCIS is present, posing a high risk for cancer and often accompanies cancer. Chemotherapy is planned before further surgery due to recovery time and surgeon availability. Chemotherapy aims to address microscopic disease and reduce recurrence risk. She is concerned about potential cancer in the right breast and is considering a mastectomy. - Start chemotherapy as scheduled. - Monitor blood counts and manage side effects such as nausea and fatigue. - Advise on infection risk and timing of public activities based on chemotherapy cycle. - Discuss potential for mastectomy and further surgery with the surgical team. - Schedule follow-up phone visit next week to monitor progress after the first cycle of chemotherapy. - Baseline echocardiogram was normal.  Will follow-up every 3 months.  Type 1 diabetes mellitus She has type 1 diabetes mellitus requiring careful management during chemotherapy due to potential impacts on blood glucose levels, particularly with steroids like dexamethasone . She is aware of the need to monitor blood glucose levels closely and adjust insulin  as needed. The endocrinologist has provided guidance on managing blood glucose levels during treatment. - Monitor blood glucose levels closely, especially after taking dexamethasone . - Adjust insulin  as needed based on blood glucose readings. - Ensure adequate nutrition and hydration to prevent hypoglycemia.  Plan - She is scheduled for first cycle of chemotherapy [facility] tomorrow, we again reviewed the potential side effect in the management, she voiced good understanding. - Labs today are adequate for treatment. - I spoke with her son on the phone today, and answered all his questions. - Phone visit in a week for toxicity checkup.   SUMMARY OF ONCOLOGIC HISTORY: Oncology History  Malignant neoplasm of upper-outer quadrant of left breast in female, estrogen  receptor positive (HCC)  06/22/2024 Cancer Staging   Staging form: Breast,  AJCC 8th Edition - Clinical stage from 06/22/2024: Stage IB (cT2, cN0, cM0, G2, ER+, PR+, HER2+) - Signed by Lanny Callander, MD on 06/30/2024 Stage prefix: Initial diagnosis Histologic grading system: 3 grade system   06/28/2024 Initial Diagnosis   Malignant neoplasm of upper-outer quadrant of left breast in female, estrogen receptor positive (HCC)   07/27/2024 Cancer Staging   Staging form: Breast, AJCC 8th Edition - Pathologic stage from 07/27/2024: Stage IA (pT2, pN0, cM0, G2, ER+, PR+, HER2+) - Signed by Lanny Callander, MD on 08/08/2024 Stage prefix: Initial diagnosis Histologic grading system: 3 grade system Residual tumor (R): R0   08/25/2024 -  Chemotherapy   Patient is on Treatment Plan : BREAST Docetaxel + Carboplatin + Trastuzumab (TCH) q21d / Trastuzumab q21d        Discussed the use of AI scribe software for clinical note transcription with the patient, who gave verbal consent to proceed.  History of Present Illness [name] is a 78 year old female with breast cancer who presents for follow-up.  She is scheduled to start chemotherapy tomorrow and is concerned about attending public gatherings due to potential infection exposure. She plans to wear a mask and is nervous about attending theater events. She underwent a lumpectomy with clear lymph nodes but has lobular carcinoma in situ (LCIS) in the same area. She is considering a mastectomy for the right breast due to concerns about pleomorphic LCIS.  She is managing diabetes and has discussed blood sugar management with her endocrinologist, particularly in relation to chemotherapy. She is aware that steroids may affect her blood sugar and plans to monitor her levels closely.  She has nausea medications at home, including Zofran  and Compazine. She experiences fatigue and low appetite post-chemotherapy and is aware of the potential for nausea and diarrhea during  treatment.     All other systems were reviewed with the patient and are negative.  MEDICAL HISTORY:  Past Medical History:  Diagnosis Date   Arthritis    OA   Atrial flutter (HCC)    a. s/p TEE/DCCV 08/16/13 (normal LV function, no LAA thrombus).b. s/p ablation by Dr Waddell 09-23-2013   Coronary artery disease    2009 LAD Promus stent   Dysrhythmia    Aflutter, no issues after ablation 2014   Fibroid    Hyperlipidemia    Hypertension    Insulin  pump in place    Osteopenia    Type 1 diabetes mellitus on insulin  therapy (HCC)    Valvular heart disease    a. Mild MR/TR by TEE 08/2013.    SURGICAL HISTORY: Past Surgical History:  Procedure Laterality Date   ABLATION  09/23/2013   CTI by Dr Waddell   APPENDECTOMY     APPLICATION OF WOUND VAC Left 04/11/2017   Procedure: APPLICATION OF WOUND VAC LEFT KNEE;  Surgeon: Fidel Rogue, MD;  Location: [facility] OR;  Service: Orthopedics;  Laterality: Left;   ATRIAL FLUTTER ABLATION N/A 09/23/2013   Procedure: ATRIAL FLUTTER ABLATION;  Surgeon: Danelle LELON Waddell, MD;  Location: [facility] CATH LAB;  Service: Cardiovascular;  Laterality: N/A;   CARDIOVERSION N/A 08/16/2013   Procedure: CARDIOVERSION;  Surgeon: Aleene JINNY Passe, MD;  Location: [facility] ENDOSCOPY;  Service: Cardiovascular;  Laterality: N/A;  spoke with Tom   CATARACT EXTRACTION Bilateral    COLONOSCOPY     CORONARY ANGIOPLASTY WITH STENT PLACEMENT     EYE SURGERY     Cataract   LASER ABLATION Right 11/20/2023   Right leg laser ablation  of the Greater saphenous vein with >20 stab phlebectomies   MYOMECTOMY  1977   ORIF PATELLA Left 04/11/2017   ORIF PATELLA Left 04/11/2017   Procedure: OPEN REDUCTION INTERNAL (ORIF) FIXATION PATELLA LEFT KNEE;  Surgeon: Fidel Rogue, MD;  Location: [facility] OR;  Service: Orthopedics;  Laterality: Left;   PELVIC LAPAROSCOPY     PORTACATH PLACEMENT Right 07/27/2024   Procedure: INSERTION OF TUNNELED CENTRAL VENOUS DEVICE WITH PORT;  Surgeon: Vernetta Berg,  MD;  Location: [facility];  Service: General;  Laterality: Right;  PORT-A-CATH INSERTION WITH ULTRASOUND GUIDANCE   SENTINEL NODE BIOPSY Left 07/27/2024   Procedure: LEFT SENTINEL LYMPH NODE BIOPSY;  Surgeon: Vernetta Berg, MD;  Location: [facility];  Service: General;  Laterality: Left;  SENTINEL NODE BIOPSY   TEE WITHOUT CARDIOVERSION N/A 08/16/2013   Procedure: TRANSESOPHAGEAL ECHOCARDIOGRAM (TEE);  Surgeon: Aleene JINNY Passe, MD;  Location: [facility] ENDOSCOPY;  Service: Cardiovascular;  Laterality: N/A;   TUBAL LIGATION  1988    I have reviewed the social history and family history with the patient and they are unchanged from previous note.  ALLERGIES:  is allergic to sulfa antibiotics and sulfamethoxazole-trimethoprim.  MEDICATIONS:  Current Outpatient Medications  Medication Sig Dispense Refill   Calcium  Carbonate-Vitamin D  (CALCIUM  + D PO) Take 600 mg by mouth 2 (two) times daily.     cholecalciferol  (VITAMIN D ) 1000 units tablet Take 1,000 Units by mouth 2 (two) times daily.      clopidogrel  (PLAVIX ) 75 MG tablet TAKE 1 TABLET DAILY 90 tablet 3   Continuous Glucose Sensor (DEXCOM G7 15 DAY SENSOR) MISC by Does not apply route.     cyanocobalamin  (VITAMIN B12) 1000 MCG tablet Take 1,000 mcg by mouth daily.     dexamethasone  (DECADRON ) 4 MG tablet Take 1 tabs by mouth 2 times daily starting day before chemo. Then take 1 tabs daily for 2 days starting day after chemo. Take with food. 30 tablet 1   ezetimibe  (ZETIA ) 10 MG tablet Take 1 tablet (10 mg total) by mouth daily. 90 tablet 3   gabapentin  (NEURONTIN ) 100 MG capsule Take 2 capsules (200 mg total) by mouth at bedtime. 180 capsule 0   Insulin  Lispro-aabc (LYUMJEV ) 100 UNIT/ML SOLN USE A MAXIMUM OF 90 UNITS DAILY VIA INSULIN  PUMP 80 mL 3   lidocaine -prilocaine (EMLA) cream Apply to affected area once 30 g 3   losartan  (COZAAR ) 100 MG tablet Take 1 tablet (100 mg total) by mouth daily. 90 tablet 3   metFORMIN   (GLUCOPHAGE -XR) 500 MG 24 hr tablet TAKE 1 TABLET IN THE MORNING AND 2 TABLETS IN THE EVENING AS DIRECTED 270 tablet 3   metoprolol  succinate (TOPROL -XL) 100 MG 24 hr tablet TAKE 1 TABLET DAILY 90 tablet 3   Multiple Vitamins-Minerals (PRESERVISION AREDS 2 PO) Take by mouth in the morning and at bedtime.     nitroGLYCERIN  (NITROSTAT ) 0.4 MG SL tablet DISSOLVE 1 TABLET UNDER THE TONGUE EVERY 5 MINUTES AS NEEDED FOR CHEST PAIN 75 tablet 2   ondansetron  (ZOFRAN ) 8 MG tablet Take 1 tablet (8 mg total) by mouth every 8 (eight) hours as needed for nausea or vomiting. Start on the third day after chemotherapy. 30 tablet 1   prochlorperazine (COMPAZINE) 10 MG tablet Take 1 tablet (10 mg total) by mouth every 6 (six) hours as needed for nausea or vomiting. 30 tablet 1   Pyridoxine HCl (VITAMIN B6) 100 MG TABS      rosuvastatin  (CRESTOR ) 40 MG tablet  Take 1 tablet (40 mg total) by mouth daily. 90 tablet 3   solifenacin  (VESICARE ) 10 MG tablet Take 1 tablet (10 mg total) by mouth daily. 90 tablet 3   spironolactone  (ALDACTONE ) 25 MG tablet Take 1 tablet (25 mg total) by mouth daily. 90 tablet 3   TART CHERRY PO Take by mouth.     traMADol  (ULTRAM ) 50 MG tablet Take 1 tablet (50 mg total) by mouth every 6 (six) hours as needed for moderate pain (pain score 4-6) or severe pain (pain score 7-10). 25 tablet 0   No current facility-administered medications for this visit.    PHYSICAL EXAMINATION: ECOG PERFORMANCE STATUS: 0 - Asymptomatic  Vitals:   08/24/24 0950  BP: 134/64  Pulse: 91  Resp: 16  Temp: 97.9 F (36.6 C)  SpO2: 99%   Wt Readings from Last 3 Encounters:  08/24/24 197 lb 4.8 oz (89.5 kg)  08/23/24 195 lb (88.5 kg)  08/12/24 194 lb 6.4 oz (88.2 kg)     GENERAL:alert, no distress and comfortable SKIN: skin color, texture, turgor are normal, no rashes or significant lesions EYES: normal, Conjunctiva are pink and non-injected, sclera clear Musculoskeletal:no cyanosis of digits and no  clubbing  NEURO: alert & oriented x 3 with fluent speech, no focal motor/sensory deficits  Physical Exam    LABORATORY DATA:  I have reviewed the data as listed    Latest Ref Rng & Units 08/24/2024    9:08 AM 06/30/2024    1:29 PM 11/22/2019    8:49 AM  CBC  WBC 4.0 - 10.5 K/uL 6.0  6.2  6.5   Hemoglobin 12.0 - 15.0 g/dL 85.8  84.1  85.5   Hematocrit 36.0 - 46.0 % 41.1  47.0  43.9   Platelets 150 - 400 K/uL 277  331  246         Latest Ref Rng & Units 08/24/2024    9:08 AM 08/23/2024    1:23 PM 07/29/2024    8:11 AM  CMP  Glucose 70 - 99 mg/dL 68  890  83   BUN 8 - 23 mg/dL 17  16  17    Creatinine 0.44 - 1.00 mg/dL 9.31  9.21  9.31   Sodium 135 - 145 mmol/L 139  140  142   Potassium 3.5 - 5.1 mmol/L 4.3  5.3  4.6   Chloride 98 - 111 mmol/L 105  100  107   CO2 22 - 32 mmol/L 29  20  27    Calcium  8.9 - 10.3 mg/dL 9.9  89.7  9.1   Total Protein 6.5 - 8.1 g/dL 6.8     Total Bilirubin 0.0 - 1.2 mg/dL 0.4     Alkaline Phos 38 - 126 U/L 85     AST 15 - 41 U/L 29     ALT 0 - 44 U/L 37         RADIOGRAPHIC STUDIES: I have personally reviewed the radiological images as listed and agreed with the findings in the report. No results found.    Orders Placed This Encounter  Procedures   CBC with Differential (Cancer Center Only)    Standing Status:   Future    Expected Date:   11/03/2024    Expiration Date:   11/03/2025   CMP (Cancer Center only)    Standing Status:   Future    Expected Date:   11/03/2024    Expiration Date:   11/03/2025   All questions were answered. The  patient knows to call the clinic with any problems, questions or concerns. No barriers to learning was detected. The total time spent in the appointment was 40 minutes, including review of chart and various tests results, discussions about plan of care and coordination of care plan     Onita Mattock, MD 08/24/2024

## 2024-08-25 ENCOUNTER — Encounter: Payer: Self-pay | Admitting: *Deleted

## 2024-08-25 ENCOUNTER — Inpatient Hospital Stay

## 2024-08-25 ENCOUNTER — Inpatient Hospital Stay: Admitting: Hematology

## 2024-08-25 VITALS — BP 155/70 | HR 67 | Temp 97.7°F | Resp 16

## 2024-08-25 DIAGNOSIS — C50412 Malignant neoplasm of upper-outer quadrant of left female breast: Secondary | ICD-10-CM | POA: Diagnosis not present

## 2024-08-25 DIAGNOSIS — Z1721 Progesterone receptor positive status: Secondary | ICD-10-CM | POA: Diagnosis not present

## 2024-08-25 DIAGNOSIS — Z170 Estrogen receptor positive status [ER+]: Secondary | ICD-10-CM

## 2024-08-25 DIAGNOSIS — Z5189 Encounter for other specified aftercare: Secondary | ICD-10-CM | POA: Diagnosis not present

## 2024-08-25 DIAGNOSIS — Z1731 Human epidermal growth factor receptor 2 positive status: Secondary | ICD-10-CM | POA: Diagnosis not present

## 2024-08-25 DIAGNOSIS — Z5111 Encounter for antineoplastic chemotherapy: Secondary | ICD-10-CM | POA: Diagnosis not present

## 2024-08-25 MED ORDER — SODIUM CHLORIDE 0.9 % IV SOLN
75.0000 mg/m2 | Freq: Once | INTRAVENOUS | Status: AC
  Administered 2024-08-25: 160 mg via INTRAVENOUS
  Filled 2024-08-25: qty 16

## 2024-08-25 MED ORDER — SODIUM CHLORIDE 0.9 % IV SOLN: INTRAVENOUS | Status: DC

## 2024-08-25 MED ORDER — PALONOSETRON HCL INJECTION 0.25 MG/5ML
0.2500 mg | Freq: Once | INTRAVENOUS | Status: AC
  Administered 2024-08-25: 0.25 mg via INTRAVENOUS
  Filled 2024-08-25: qty 5

## 2024-08-25 MED ORDER — TRASTUZUMAB-DTTB CHEMO 150 MG IV SOLR
8.0000 mg/kg | Freq: Once | INTRAVENOUS | Status: AC
  Administered 2024-08-25: 714 mg via INTRAVENOUS
  Filled 2024-08-25: qty 34

## 2024-08-25 MED ORDER — DEXAMETHASONE SOD PHOSPHATE PF 10 MG/ML IJ SOLN
10.0000 mg | Freq: Once | INTRAMUSCULAR | Status: AC
  Administered 2024-08-25: 10 mg via INTRAVENOUS

## 2024-08-25 MED ORDER — DIPHENHYDRAMINE HCL 25 MG PO CAPS
50.0000 mg | ORAL_CAPSULE | Freq: Once | ORAL | Status: AC
  Administered 2024-08-25: 50 mg via ORAL
  Filled 2024-08-25: qty 2

## 2024-08-25 MED ORDER — ACETAMINOPHEN 325 MG PO TABS
650.0000 mg | ORAL_TABLET | Freq: Once | ORAL | Status: AC
  Administered 2024-08-25: 650 mg via ORAL
  Filled 2024-08-25: qty 2

## 2024-08-25 MED ORDER — SODIUM CHLORIDE 0.9 % IV SOLN
459.5000 mg | Freq: Once | INTRAVENOUS | Status: AC
  Administered 2024-08-25: 460 mg via INTRAVENOUS
  Filled 2024-08-25: qty 46

## 2024-08-25 MED ORDER — APREPITANT 130 MG/18ML IV EMUL
130.0000 mg | Freq: Once | INTRAVENOUS | Status: AC
  Administered 2024-08-25: 130 mg via INTRAVENOUS
  Filled 2024-08-25: qty 18

## 2024-08-25 NOTE — Patient Instructions (Signed)
 [facility] - A DEPT OF [facility]  Discharge Instructions: Thank you for choosing [facility] to provide your oncology and hematology care.   If you have a lab appointment with the Cancer Center, please go directly to the Cancer Center and check in at the registration area.   Wear comfortable clothing and clothing appropriate for easy access to any Portacath or PICC line.   We strive to give you quality time with your provider. You may need to reschedule your appointment if you arrive late (15 or more minutes).  Arriving late affects you and other patients whose appointments are after yours.  Also, if you miss three or more appointments without notifying the office, you may be dismissed from the clinic at the provider's discretion.      For prescription refill requests, have your pharmacy contact our office and allow 72 hours for refills to be completed.    Today you received the following chemotherapy and/or immunotherapy agents trastuzumab, docetaxel, carboplatin      To help prevent nausea and vomiting after your treatment, we encourage you to take your nausea medication as directed.  BELOW ARE SYMPTOMS THAT SHOULD BE REPORTED IMMEDIATELY: *FEVER GREATER THAN 100.4 F (38 C) OR HIGHER *CHILLS OR SWEATING *NAUSEA AND VOMITING THAT IS NOT CONTROLLED WITH YOUR NAUSEA MEDICATION *UNUSUAL SHORTNESS OF BREATH *UNUSUAL BRUISING OR BLEEDING *URINARY PROBLEMS (pain or burning when urinating, or frequent urination) *BOWEL PROBLEMS (unusual diarrhea, constipation, pain near the anus) TENDERNESS IN MOUTH AND THROAT WITH OR WITHOUT PRESENCE OF ULCERS (sore throat, sores in mouth, or a toothache) UNUSUAL RASH, SWELLING OR PAIN  UNUSUAL VAGINAL DISCHARGE OR ITCHING   Items with * indicate a potential emergency and should be followed up as soon as possible or go to the Emergency Department if any problems should occur.  Please show the CHEMOTHERAPY  ALERT CARD or IMMUNOTHERAPY ALERT CARD at check-in to the Emergency Department and triage nurse.  Should you have questions after your visit or need to cancel or reschedule your appointment, please contact [facility] - A DEPT OF [facility]  Dept: [phone]  and follow the prompts.  Office hours are 8:00 a.m. to 4:30 p.m. Monday - Friday. Please note that voicemails left after 4:00 p.m. may not be returned until the following business day.  We are closed weekends and major holidays. You have access to a nurse at all times for urgent questions. Please call the main number to the clinic Dept: [phone] and follow the prompts.   For any non-urgent questions, you may also contact your provider using MyChart. We now offer e-Visits for anyone 15 and older to request care online for non-urgent symptoms. For details visit [email or web address].   Also download the MyChart app! Go to the app store, search MyChart, open the app, select [place], and log in with your MyChart username and password.

## 2024-08-25 NOTE — Research (Signed)
 DCP-001: Use of a Clinical Trial Screening Tool to Address Cancer Health Disparities in the [facility] Oncology Research Program [place])   Patient [name] was identified by this research nurse as a potential candidate for the above listed study.  This Clinical Research Nurse met with [name], [id number] on 08/25/24 in a manner and location that ensures patient privacy to discuss participation in the above listed research study.  Patient is Accompanied by her husband. A copy of the informed consent document and separate HIPAA Authorization was provided to the patient.  Patient reads, speaks, and understands [place].  Patient has not yet read the informed consent documents and so documents were reviewed page by page today.  Patient was provided the option of taking informed consent documents home to review and was encouraged to review at their convenience with their support network, including other care providers. Patient is comfortable with making a decision regarding study participation today.  As outlined in the informed consent form, this Nurse and Leita JINNY Alert discussed the purpose of the research study, the investigational nature of the study, study procedures and requirements for study participation, potential risks and benefits of study participation, as well as alternatives to participation.   The patient understands participation is voluntary and they may withdraw from study participation at any time. Patient understands enrollment is pending full eligibility review.   Confidentiality and how the patient's information will be used as part of study participation were discussed.  Patient was informed there is not reimbursement provided for their time and effort spent on trial participation.    All questions were answered to patient's satisfaction.  The informed consent and separate HIPAA Authorization was reviewed page by page.  The patient's mental and emotional status is  appropriate to provide informed consent, and the patient verbalizes an understanding of study participation.  Patient has agreed to participate in the above listed research study and has voluntarily signed the informed consent Protocol Version Date 02/10/2024  and separate HIPAA Authorization, version IRB approved 03/23/24 on 08/25/24 at 10:25AM.  The patient was provided with a copy of the signed informed consent form and separate HIPAA Authorization for their reference.  No study specific procedures were obtained prior to the signing of the informed consent document.  Approximately 20 minutes were spent with the patient reviewing the informed consent documents.  Patient was not requested to complete a Release of Information form.   Patient was interviewed to collect data for the study and answers were marked on the printed study CRF by this research RN.  Other data also collected from patients EMR.   This Nurse has reviewed this patient's inclusion and exclusion criteria and confirmed [name] is eligible for study participation.  Patient will be enrolled on the above study.   Cherylyn Hoard, BSN, RN, [employer] Research Nurse II [phone] 08/25/2024 10:58 AM

## 2024-08-26 ENCOUNTER — Telehealth: Payer: Self-pay

## 2024-08-26 NOTE — Telephone Encounter (Signed)
-----   Message from Nurse Ileana HERO sent at 08/25/2024  2:50 PM EDT ----- Regarding: First Time TCH, pt of feng First Time TCH, pt of feng. Tolerated well. Did cryotherapy and dignicap. Please follow up with patient when possible, thanks!

## 2024-08-26 NOTE — Telephone Encounter (Signed)
 Sent message to verify patient received supplies

## 2024-08-27 ENCOUNTER — Inpatient Hospital Stay

## 2024-08-27 ENCOUNTER — Ambulatory Visit: Payer: Self-pay | Admitting: Cardiology

## 2024-08-27 VITALS — BP 133/71 | HR 68 | Temp 97.7°F | Resp 15

## 2024-08-27 DIAGNOSIS — E875 Hyperkalemia: Secondary | ICD-10-CM

## 2024-08-27 DIAGNOSIS — Z1721 Progesterone receptor positive status: Secondary | ICD-10-CM | POA: Diagnosis not present

## 2024-08-27 DIAGNOSIS — Z5111 Encounter for antineoplastic chemotherapy: Secondary | ICD-10-CM | POA: Diagnosis not present

## 2024-08-27 DIAGNOSIS — Z5189 Encounter for other specified aftercare: Secondary | ICD-10-CM | POA: Diagnosis not present

## 2024-08-27 DIAGNOSIS — Z170 Estrogen receptor positive status [ER+]: Secondary | ICD-10-CM | POA: Diagnosis not present

## 2024-08-27 DIAGNOSIS — Z1731 Human epidermal growth factor receptor 2 positive status: Secondary | ICD-10-CM | POA: Diagnosis not present

## 2024-08-27 DIAGNOSIS — Z79899 Other long term (current) drug therapy: Secondary | ICD-10-CM

## 2024-08-27 DIAGNOSIS — C50412 Malignant neoplasm of upper-outer quadrant of left female breast: Secondary | ICD-10-CM

## 2024-08-27 MED ORDER — PEGFILGRASTIM-FPGK 6 MG/0.6ML ~~LOC~~ SOSY
6.0000 mg | PREFILLED_SYRINGE | Freq: Once | SUBCUTANEOUS | Status: AC
  Administered 2024-08-27: 6 mg via SUBCUTANEOUS
  Filled 2024-08-27: qty 0.6

## 2024-08-29 ENCOUNTER — Other Ambulatory Visit: Payer: Self-pay

## 2024-08-30 ENCOUNTER — Other Ambulatory Visit: Payer: Self-pay

## 2024-08-30 ENCOUNTER — Encounter: Payer: Self-pay | Admitting: *Deleted

## 2024-08-30 ENCOUNTER — Telehealth: Payer: Self-pay | Admitting: Cardiology

## 2024-08-30 DIAGNOSIS — Z79899 Other long term (current) drug therapy: Secondary | ICD-10-CM

## 2024-08-30 NOTE — Telephone Encounter (Signed)
 Patient identification verified by 2 forms.   Called and spoke to patient  -Went over Dr. Denver messages. Orders placed for repeat lab work.             Patient agrees with plan, no questions at this time

## 2024-08-30 NOTE — Progress Notes (Signed)
 Patient identification verified by 2 forms.   Called and spoke to patient  -Went over Dr. Denver messages. Orders placed for repeat lab work.             Patient agrees with plan, no questions at this time

## 2024-08-30 NOTE — Assessment & Plan Note (Signed)
 pT2N0M0, stage IA,G2 invasive lobular carcinoma, triple positive -Discovered during screening mammogram. 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  4 sentinel lymph nodes were negative.  Margins were negative for invasive carcinoma, but positive for LCIS. -I recommend adjuvant chemotherapy and anti-HER2 therapy TCH every 3 weeksX6 followed by trastuzumab maintenance therapy to complete a 1 year therapy. She started on 08/25/2024

## 2024-08-30 NOTE — Telephone Encounter (Signed)
 Patient returning call for results

## 2024-08-31 ENCOUNTER — Inpatient Hospital Stay (HOSPITAL_BASED_OUTPATIENT_CLINIC_OR_DEPARTMENT_OTHER): Admitting: Hematology

## 2024-08-31 ENCOUNTER — Encounter: Payer: Self-pay | Admitting: Cardiology

## 2024-08-31 DIAGNOSIS — Z1731 Human epidermal growth factor receptor 2 positive status: Secondary | ICD-10-CM | POA: Diagnosis not present

## 2024-08-31 DIAGNOSIS — Z5111 Encounter for antineoplastic chemotherapy: Secondary | ICD-10-CM | POA: Diagnosis not present

## 2024-08-31 DIAGNOSIS — C50412 Malignant neoplasm of upper-outer quadrant of left female breast: Secondary | ICD-10-CM | POA: Diagnosis not present

## 2024-08-31 DIAGNOSIS — Z170 Estrogen receptor positive status [ER+]: Secondary | ICD-10-CM

## 2024-08-31 DIAGNOSIS — Z5189 Encounter for other specified aftercare: Secondary | ICD-10-CM | POA: Diagnosis not present

## 2024-08-31 DIAGNOSIS — Z1721 Progesterone receptor positive status: Secondary | ICD-10-CM | POA: Diagnosis not present

## 2024-08-31 NOTE — Progress Notes (Signed)
 [facility]   [phone] [phone]   Clinic Follow up Note   Patient Care Team: Rollene Almarie LABOR, MD as PCP - General (Internal Medicine) Lavona Agent, MD as PCP - Cardiology (Cardiology) [name], Aleene PARAS, MD (Inactive) (Cardiology) Dwane Lerner, MD (Dermatology) Waddell Danelle ORN, MD (Cardiology) Obie Princella HERO, MD (Inactive) (Gastroenterology) Claudene Arthea HERO, DO (Sports Medicine) Oman, Heather, OD as Consulting Physician (Optometry) Szabat, Toribio BROCKS, [facility] (Inactive) (Pharmacist) Magdalen Pasco RAMAN, DPM as Consulting Physician (Podiatry) Thapa, Sudan, MD as Consulting Physician (Endocrinology) Gerome, Devere HERO, RN as Oncology Nurse Navigator Tyree Nanetta SAILOR, RN as Oncology Nurse Navigator Vernetta Berg, MD as Consulting Physician (General Surgery) Lanny Callander, MD as Consulting Physician (Hematology) Dewey Rush, MD as Consulting Physician (Radiation Oncology) 08/31/2024  I connected with [name] Alert on 08/31/24 at  8:45 AM EDT by telephone and verified that I am speaking with the correct person using two identifiers.   I discussed the limitations, risks, security and privacy concerns of performing an evaluation and management service by telephone and the availability of in person appointments. I also discussed with the patient that there may be a patient responsible charge related to this service. The patient expressed understanding and agreed to proceed.   Patient's location:  Home  Provider's location:  Office    CHIEF COMPLAINT: Chemotoxicity check up   CURRENT THERAPY: Adjuvant chemotherapy TCH 3 weeks  Oncology history Malignant neoplasm of upper-outer quadrant of left breast in female, estrogen receptor positive (HCC) pT2N0M0, stage IA,G2 invasive lobular carcinoma, triple positive -Discovered during screening mammogram. -Status post lumpectomy and sentinel lymph node biopsy on July 27, 2024, which showed 3.2 cm  invasive lobular carcinoma and LCIS.  4 sentinel lymph nodes were negative.  Margins were negative for invasive carcinoma, but positive for LCIS. -I recommend adjuvant chemotherapy and anti-HER2 therapy TCH every 3 weeksX6 followed by trastuzumab maintenance therapy to complete a 1 year therapy. She started on 08/25/2024   Assessment & Plan Breast cancer of left upper-outer quadrant Currently undergoing chemotherapy with Atrovent, in the second week of the cycle. Experiencing expected side effects, which are being managed symptomatically. Improvement is anticipated as she progresses, with the third week expected to be optimal for recovery and preparation for the next cycle. - Continue current chemotherapy regimen with Atrovent. - Monitor for side effects and manage symptomatically.  Chemotherapy-related adverse effects Experiencing fatigue, nausea, diarrhea, and bone pain. Fatigue and nausea are significant, with nausea starting on the second day post-chemotherapy. Diarrhea is mild and intermittent. Bone pain in the hip and thighs is likely related to the white blood cell growth factor injection. Symptoms are expected to improve as she progresses through the chemotherapy cycle. - Encourage adequate hydration and nutrition to manage fatigue and diarrhea. - Advise use of anti-nausea medication as needed for nausea. - Recommend rest and pacing activities to manage fatigue. - Suggest use of analgesics like Advil or Tylenol  for bone pain as needed.   Plan - She had a moderate side effect to chemotherapy, we discussed management of side effects. - Follow-up in 2 weeks before cycle 2 chemo.  SUMMARY OF ONCOLOGIC HISTORY: Oncology History  Malignant neoplasm of upper-outer quadrant of left breast in female, estrogen receptor positive (HCC)  06/22/2024 Cancer Staging   Staging form: Breast, AJCC 8th Edition - Clinical stage from 06/22/2024: Stage IB (cT2, cN0, cM0, G2, ER+, PR+, HER2+) - Signed by  Lanny Callander, MD on 06/30/2024 Stage prefix: Initial diagnosis Histologic  grading system: 3 grade system   06/28/2024 Initial Diagnosis   Malignant neoplasm of upper-outer quadrant of left breast in female, estrogen receptor positive (HCC)   07/27/2024 Cancer Staging   Staging form: Breast, AJCC 8th Edition - Pathologic stage from 07/27/2024: Stage IA (pT2, pN0, cM0, G2, ER+, PR+, HER2+) - Signed by Lanny Callander, MD on 08/08/2024 Stage prefix: Initial diagnosis Histologic grading system: 3 grade system Residual tumor (R): R0   08/25/2024 -  Chemotherapy   Patient is on Treatment Plan : BREAST Docetaxel + Carboplatin + Trastuzumab (TCH) q21d / Trastuzumab q21d       Discussed the use of AI scribe software for clinical note transcription with the patient, who gave verbal consent to proceed.  History of Present Illness [name] is a 78 year old female with breast cancer who presents for a toxicity checkup after starting Atrovent chemotherapy. She is accompanied by her husband.  She began Atrovent chemotherapy last week, with the initial treatment day extended due to cold capping. She received a white blood cell growth injection the following day. By Friday and Saturday, she experienced significant fatigue, lack of energy, and overall discomfort. Indigestion and mild diarrhea occurred after eating, but no major gastrointestinal issues were present. Nausea began on Monday and was controlled with anti-nausea medication. Bone pain, particularly in her hips and thighs, followed the white blood cell growth injection and was exacerbated by physical activity. She managed the pain with Advil or Tylenol  and rest. No fever or chills were noted. Mild constipation occurred once but resolved without recurrence. She feels slightly better today and is considering resuming some household activities, while acknowledging the need to pace herself.     REVIEW OF SYSTEMS:   Constitutional: Denies fevers, chills or  abnormal weight loss Eyes: Denies blurriness of vision Ears, nose, mouth, throat, and face: Denies mucositis or sore throat Respiratory: Denies cough, dyspnea or wheezes Cardiovascular: Denies palpitation, chest discomfort or lower extremity swelling Gastrointestinal:  Denies nausea, heartburn or change in bowel habits Skin: Denies abnormal skin rashes Lymphatics: Denies new lymphadenopathy or easy bruising Neurological:Denies numbness, tingling or new weaknesses Behavioral/Psych: Mood is stable, no new changes  All other systems were reviewed with the patient and are negative.  MEDICAL HISTORY:  Past Medical History:  Diagnosis Date   Arthritis    OA   Atrial flutter (HCC)    a. s/p TEE/DCCV 08/16/13 (normal LV function, no LAA thrombus).b. s/p ablation by Dr Waddell 09-23-2013   Coronary artery disease    2009 LAD Promus stent   Dysrhythmia    Aflutter, no issues after ablation 2014   Fibroid    Hyperlipidemia    Hypertension    Insulin  pump in place    Osteopenia    Type 1 diabetes mellitus on insulin  therapy (HCC)    Valvular heart disease    a. Mild MR/TR by TEE 08/2013.    SURGICAL HISTORY: Past Surgical History:  Procedure Laterality Date   ABLATION  09/23/2013   CTI by Dr Waddell   APPENDECTOMY     APPLICATION OF WOUND VAC Left 04/11/2017   Procedure: APPLICATION OF WOUND VAC LEFT KNEE;  Surgeon: Fidel Rogue, MD;  Location: [facility] OR;  Service: Orthopedics;  Laterality: Left;   ATRIAL FLUTTER ABLATION N/A 09/23/2013   Procedure: ATRIAL FLUTTER ABLATION;  Surgeon: Danelle LELON Waddell, MD;  Location: [facility] CATH LAB;  Service: Cardiovascular;  Laterality: N/A;   CARDIOVERSION N/A 08/16/2013   Procedure: CARDIOVERSION;  Surgeon: Aleene  JINNY Passe, MD;  Location: [facility] ENDOSCOPY;  Service: Cardiovascular;  Laterality: N/A;  spoke with Tom   CATARACT EXTRACTION Bilateral    COLONOSCOPY     CORONARY ANGIOPLASTY WITH STENT PLACEMENT     EYE SURGERY     Cataract   LASER ABLATION  Right 11/20/2023   Right leg laser ablation of the Greater saphenous vein with >20 stab phlebectomies   MYOMECTOMY  1977   ORIF PATELLA Left 04/11/2017   ORIF PATELLA Left 04/11/2017   Procedure: OPEN REDUCTION INTERNAL (ORIF) FIXATION PATELLA LEFT KNEE;  Surgeon: Fidel Rogue, MD;  Location: [facility] OR;  Service: Orthopedics;  Laterality: Left;   PELVIC LAPAROSCOPY     PORTACATH PLACEMENT Right 07/27/2024   Procedure: INSERTION OF TUNNELED CENTRAL VENOUS DEVICE WITH PORT;  Surgeon: Vernetta Berg, MD;  Location: [facility];  Service: General;  Laterality: Right;  PORT-A-CATH INSERTION WITH ULTRASOUND GUIDANCE   SENTINEL NODE BIOPSY Left 07/27/2024   Procedure: LEFT SENTINEL LYMPH NODE BIOPSY;  Surgeon: Vernetta Berg, MD;  Location: [facility];  Service: General;  Laterality: Left;  SENTINEL NODE BIOPSY   TEE WITHOUT CARDIOVERSION N/A 08/16/2013   Procedure: TRANSESOPHAGEAL ECHOCARDIOGRAM (TEE);  Surgeon: Aleene JINNY Passe, MD;  Location: [facility];  Service: Cardiovascular;  Laterality: N/A;   TUBAL LIGATION  1988    I have reviewed the social history and family history with the patient and they are unchanged from previous note.  ALLERGIES:  is allergic to sulfa antibiotics and sulfamethoxazole-trimethoprim.  MEDICATIONS:  Current Outpatient Medications  Medication Sig Dispense Refill   Calcium  Carbonate-Vitamin D  (CALCIUM  + D PO) Take 600 mg by mouth 2 (two) times daily.     cholecalciferol  (VITAMIN D ) 1000 units tablet Take 1,000 Units by mouth 2 (two) times daily.      clopidogrel  (PLAVIX ) 75 MG tablet TAKE 1 TABLET DAILY 90 tablet 3   Continuous Glucose Sensor (DEXCOM G7 15 DAY SENSOR) MISC by Does not apply route.     cyanocobalamin  (VITAMIN B12) 1000 MCG tablet Take 1,000 mcg by mouth daily.     dexamethasone  (DECADRON ) 4 MG tablet Take 1 tabs by mouth 2 times daily starting day before chemo. Then take 1 tabs daily for 2 days starting day after  chemo. Take with food. 30 tablet 1   ezetimibe  (ZETIA ) 10 MG tablet Take 1 tablet (10 mg total) by mouth daily. 90 tablet 3   gabapentin  (NEURONTIN ) 100 MG capsule Take 2 capsules (200 mg total) by mouth at bedtime. 180 capsule 0   Insulin  Lispro-aabc (LYUMJEV ) 100 UNIT/ML SOLN USE A MAXIMUM OF 90 UNITS DAILY VIA INSULIN  PUMP 80 mL 3   lidocaine -prilocaine (EMLA) cream Apply to affected area once 30 g 3   losartan  (COZAAR ) 100 MG tablet Take 1 tablet (100 mg total) by mouth daily. 90 tablet 3   metFORMIN  (GLUCOPHAGE -XR) 500 MG 24 hr tablet TAKE 1 TABLET IN THE MORNING AND 2 TABLETS IN THE EVENING AS DIRECTED 270 tablet 3   metoprolol  succinate (TOPROL -XL) 100 MG 24 hr tablet TAKE 1 TABLET DAILY 90 tablet 3   Multiple Vitamins-Minerals (PRESERVISION AREDS 2 PO) Take by mouth in the morning and at bedtime.     nitroGLYCERIN  (NITROSTAT ) 0.4 MG SL tablet DISSOLVE 1 TABLET UNDER THE TONGUE EVERY 5 MINUTES AS NEEDED FOR CHEST PAIN 75 tablet 2   ondansetron  (ZOFRAN ) 8 MG tablet Take 1 tablet (8 mg total) by mouth every 8 (eight) hours as needed for nausea or  vomiting. Start on the third day after chemotherapy. 30 tablet 1   prochlorperazine (COMPAZINE) 10 MG tablet Take 1 tablet (10 mg total) by mouth every 6 (six) hours as needed for nausea or vomiting. 30 tablet 1   Pyridoxine HCl (VITAMIN B6) 100 MG TABS      rosuvastatin  (CRESTOR ) 40 MG tablet Take 1 tablet (40 mg total) by mouth daily. 90 tablet 3   solifenacin  (VESICARE ) 10 MG tablet Take 1 tablet (10 mg total) by mouth daily. 90 tablet 3   spironolactone  (ALDACTONE ) 25 MG tablet Take 1 tablet (25 mg total) by mouth daily. 90 tablet 3   TART CHERRY PO Take by mouth.     traMADol  (ULTRAM ) 50 MG tablet Take 1 tablet (50 mg total) by mouth every 6 (six) hours as needed for moderate pain (pain score 4-6) or severe pain (pain score 7-10). 25 tablet 0   No current facility-administered medications for this visit.    PHYSICAL EXAMINATION: Not  performed   LABORATORY DATA:  I have reviewed the data as listed    Latest Ref Rng & Units 08/24/2024    9:08 AM 06/30/2024    1:29 PM 11/22/2019    8:49 AM  CBC  WBC 4.0 - 10.5 K/uL 6.0  6.2  6.5   Hemoglobin 12.0 - 15.0 g/dL 85.8  84.1  85.5   Hematocrit 36.0 - 46.0 % 41.1  47.0  43.9   Platelets 150 - 400 K/uL 277  331  246         Latest Ref Rng & Units 08/24/2024    9:08 AM 08/23/2024    1:23 PM 07/29/2024    8:11 AM  CMP  Glucose 70 - 99 mg/dL 68  890  83   BUN 8 - 23 mg/dL 17  16  17    Creatinine 0.44 - 1.00 mg/dL 9.31  9.21  9.31   Sodium 135 - 145 mmol/L 139  140  142   Potassium 3.5 - 5.1 mmol/L 4.3  5.3  4.6   Chloride 98 - 111 mmol/L 105  100  107   CO2 22 - 32 mmol/L 29  20  27    Calcium  8.9 - 10.3 mg/dL 9.9  89.7  9.1   Total Protein 6.5 - 8.1 g/dL 6.8     Total Bilirubin 0.0 - 1.2 mg/dL 0.4     Alkaline Phos 38 - 126 U/L 85     AST 15 - 41 U/L 29     ALT 0 - 44 U/L 37         RADIOGRAPHIC STUDIES: I have personally reviewed the radiological images as listed and agreed with the findings in the report. No results found.     I discussed the assessment and treatment plan with the patient. The patient was provided an opportunity to ask questions and all were answered. The patient agreed with the plan and demonstrated an understanding of the instructions.   The patient was advised to call back or seek an in-person evaluation if the symptoms worsen or if the condition fails to improve as anticipated.  I provided 15 minutes of non face-to-face telephone visit time during this encounter, including review of chart and various tests results, discussions about plan of care and coordination of care plan.    Onita Mattock, MD 08/31/24

## 2024-09-03 DIAGNOSIS — Z79899 Other long term (current) drug therapy: Secondary | ICD-10-CM | POA: Diagnosis not present

## 2024-09-04 ENCOUNTER — Encounter: Payer: Self-pay | Admitting: Hematology

## 2024-09-04 LAB — BASIC METABOLIC PANEL WITH GFR
BUN/Creatinine Ratio: 16 (ref 12–28)
BUN: 18 mg/dL (ref 8–27)
CO2: 24 mmol/L (ref 20–29)
Calcium: 11.1 mg/dL — ABNORMAL HIGH (ref 8.7–10.3)
Chloride: 92 mmol/L — ABNORMAL LOW (ref 96–106)
Creatinine, Ser: 1.1 mg/dL — ABNORMAL HIGH (ref 0.57–1.00)
Glucose: 121 mg/dL — ABNORMAL HIGH (ref 70–99)
Potassium: 4.7 mmol/L (ref 3.5–5.2)
Sodium: 136 mmol/L (ref 134–144)
eGFR: 52 mL/min/1.73 — ABNORMAL LOW (ref >59)

## 2024-09-07 ENCOUNTER — Other Ambulatory Visit: Payer: Self-pay | Admitting: Internal Medicine

## 2024-09-08 ENCOUNTER — Ambulatory Visit: Payer: Self-pay | Admitting: Cardiology

## 2024-09-08 DIAGNOSIS — Z79899 Other long term (current) drug therapy: Secondary | ICD-10-CM

## 2024-09-11 ENCOUNTER — Encounter: Payer: Self-pay | Admitting: Endocrinology

## 2024-09-13 NOTE — Assessment & Plan Note (Signed)
 pT2N0M0, stage IA,G2 invasive lobular carcinoma, triple positive -Discovered during screening mammogram. 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  4 sentinel lymph nodes were negative.  Margins were negative for invasive carcinoma, but positive for LCIS. -I recommend adjuvant chemotherapy and anti-HER2 therapy TCH every 3 weeksX6 followed by trastuzumab maintenance therapy to complete a 1 year therapy. She started on 08/25/2024

## 2024-09-14 ENCOUNTER — Inpatient Hospital Stay: Attending: Hematology

## 2024-09-14 ENCOUNTER — Inpatient Hospital Stay (HOSPITAL_BASED_OUTPATIENT_CLINIC_OR_DEPARTMENT_OTHER): Admitting: Hematology

## 2024-09-14 ENCOUNTER — Inpatient Hospital Stay

## 2024-09-14 VITALS — BP 148/65 | HR 70 | Resp 16

## 2024-09-14 VITALS — BP 133/59 | HR 64 | Temp 97.2°F | Resp 16 | Wt 193.7 lb

## 2024-09-14 DIAGNOSIS — Z7952 Long term (current) use of systemic steroids: Secondary | ICD-10-CM | POA: Diagnosis not present

## 2024-09-14 DIAGNOSIS — C50412 Malignant neoplasm of upper-outer quadrant of left female breast: Secondary | ICD-10-CM | POA: Insufficient documentation

## 2024-09-14 DIAGNOSIS — I10 Essential (primary) hypertension: Secondary | ICD-10-CM | POA: Diagnosis not present

## 2024-09-14 DIAGNOSIS — M1990 Unspecified osteoarthritis, unspecified site: Secondary | ICD-10-CM | POA: Insufficient documentation

## 2024-09-14 DIAGNOSIS — M8580 Other specified disorders of bone density and structure, unspecified site: Secondary | ICD-10-CM | POA: Insufficient documentation

## 2024-09-14 DIAGNOSIS — R110 Nausea: Secondary | ICD-10-CM | POA: Insufficient documentation

## 2024-09-14 DIAGNOSIS — T451X5A Adverse effect of antineoplastic and immunosuppressive drugs, initial encounter: Secondary | ICD-10-CM | POA: Insufficient documentation

## 2024-09-14 DIAGNOSIS — E785 Hyperlipidemia, unspecified: Secondary | ICD-10-CM | POA: Diagnosis not present

## 2024-09-14 DIAGNOSIS — D6481 Anemia due to antineoplastic chemotherapy: Secondary | ICD-10-CM | POA: Insufficient documentation

## 2024-09-14 DIAGNOSIS — Z79899 Other long term (current) drug therapy: Secondary | ICD-10-CM | POA: Insufficient documentation

## 2024-09-14 DIAGNOSIS — Z7984 Long term (current) use of oral hypoglycemic drugs: Secondary | ICD-10-CM | POA: Diagnosis not present

## 2024-09-14 DIAGNOSIS — Z1721 Progesterone receptor positive status: Secondary | ICD-10-CM | POA: Insufficient documentation

## 2024-09-14 DIAGNOSIS — Z5111 Encounter for antineoplastic chemotherapy: Secondary | ICD-10-CM | POA: Insufficient documentation

## 2024-09-14 DIAGNOSIS — Z170 Estrogen receptor positive status [ER+]: Secondary | ICD-10-CM | POA: Diagnosis not present

## 2024-09-14 DIAGNOSIS — Z794 Long term (current) use of insulin: Secondary | ICD-10-CM | POA: Insufficient documentation

## 2024-09-14 DIAGNOSIS — Z1732 Human epidermal growth factor receptor 2 negative status: Secondary | ICD-10-CM | POA: Insufficient documentation

## 2024-09-14 DIAGNOSIS — Z923 Personal history of irradiation: Secondary | ICD-10-CM | POA: Diagnosis not present

## 2024-09-14 LAB — CBC WITH DIFFERENTIAL (CANCER CENTER ONLY)
Abs Immature Granulocytes: 0.2 K/uL — ABNORMAL HIGH (ref 0.00–0.07)
Basophils Absolute: 0.1 K/uL (ref 0.0–0.1)
Basophils Relative: 0 %
Eosinophils Absolute: 0 K/uL (ref 0.0–0.5)
Eosinophils Relative: 0 %
HCT: 34 % — ABNORMAL LOW (ref 36.0–46.0)
Hemoglobin: 11.6 g/dL — ABNORMAL LOW (ref 12.0–15.0)
Immature Granulocytes: 1 %
Lymphocytes Relative: 8 %
Lymphs Abs: 1.2 K/uL (ref 0.7–4.0)
MCH: 30.1 pg (ref 26.0–34.0)
MCHC: 34.1 g/dL (ref 30.0–36.0)
MCV: 88.3 fL (ref 80.0–100.0)
Monocytes Absolute: 1 K/uL (ref 0.1–1.0)
Monocytes Relative: 7 %
Neutro Abs: 11.9 K/uL — ABNORMAL HIGH (ref 1.7–7.7)
Neutrophils Relative %: 84 %
Platelet Count: 358 K/uL (ref 150–400)
RBC: 3.85 MIL/uL — ABNORMAL LOW (ref 3.87–5.11)
RDW: 13.2 % (ref 11.5–15.5)
WBC Count: 14.3 K/uL — ABNORMAL HIGH (ref 4.0–10.5)
nRBC: 0 % (ref 0.0–0.2)

## 2024-09-14 LAB — CMP (CANCER CENTER ONLY)
ALT: 37 U/L (ref 0–44)
AST: 21 U/L (ref 15–41)
Albumin: 3.7 g/dL (ref 3.5–5.0)
Alkaline Phosphatase: 104 U/L (ref 38–126)
Anion gap: 6 (ref 5–15)
BUN: 16 mg/dL (ref 8–23)
CO2: 25 mmol/L (ref 22–32)
Calcium: 9.7 mg/dL (ref 8.9–10.3)
Chloride: 106 mmol/L (ref 98–111)
Creatinine: 0.63 mg/dL (ref 0.44–1.00)
GFR, Estimated: >60 mL/min (ref >60)
Glucose, Bld: 138 mg/dL — ABNORMAL HIGH (ref 70–99)
Potassium: 4.2 mmol/L (ref 3.5–5.1)
Sodium: 137 mmol/L (ref 135–145)
Total Bilirubin: 0.4 mg/dL (ref 0.0–1.2)
Total Protein: 6.1 g/dL — ABNORMAL LOW (ref 6.5–8.1)

## 2024-09-14 MED ORDER — APREPITANT 130 MG/18ML IV EMUL
130.0000 mg | Freq: Once | INTRAVENOUS | Status: AC
  Administered 2024-09-14: 130 mg via INTRAVENOUS
  Filled 2024-09-14: qty 18

## 2024-09-14 MED ORDER — TRASTUZUMAB-DTTB CHEMO 150 MG IV SOLR
6.0000 mg/kg | Freq: Once | INTRAVENOUS | Status: AC
  Administered 2024-09-14: 525 mg via INTRAVENOUS
  Filled 2024-09-14: qty 25

## 2024-09-14 MED ORDER — DIPHENHYDRAMINE HCL 25 MG PO CAPS
50.0000 mg | ORAL_CAPSULE | Freq: Once | ORAL | Status: AC
  Administered 2024-09-14: 50 mg via ORAL
  Filled 2024-09-14: qty 2

## 2024-09-14 MED ORDER — SODIUM CHLORIDE 0.9 % IV SOLN
75.0000 mg/m2 | Freq: Once | INTRAVENOUS | Status: AC
  Administered 2024-09-14: 160 mg via INTRAVENOUS
  Filled 2024-09-14: qty 16

## 2024-09-14 MED ORDER — SODIUM CHLORIDE 0.9 % IV SOLN: INTRAVENOUS | Status: DC

## 2024-09-14 MED ORDER — PALONOSETRON HCL INJECTION 0.25 MG/5ML
0.2500 mg | Freq: Once | INTRAVENOUS | Status: AC
  Administered 2024-09-14: 0.25 mg via INTRAVENOUS
  Filled 2024-09-14: qty 5

## 2024-09-14 MED ORDER — DEXAMETHASONE SOD PHOSPHATE PF 10 MG/ML IJ SOLN
10.0000 mg | Freq: Once | INTRAMUSCULAR | Status: AC
  Administered 2024-09-14: 10 mg via INTRAVENOUS

## 2024-09-14 MED ORDER — SODIUM CHLORIDE 0.9 % IV SOLN
459.5000 mg | Freq: Once | INTRAVENOUS | Status: AC
  Administered 2024-09-14: 460 mg via INTRAVENOUS
  Filled 2024-09-14: qty 46

## 2024-09-14 MED ORDER — ACETAMINOPHEN 325 MG PO TABS
650.0000 mg | ORAL_TABLET | Freq: Once | ORAL | Status: AC
  Administered 2024-09-14: 650 mg via ORAL
  Filled 2024-09-14: qty 2

## 2024-09-14 NOTE — Patient Instructions (Signed)
 [facility] - A DEPT OF [facility]  Discharge Instructions: Thank you for choosing [facility] to provide your oncology and hematology care.   If you have a lab appointment with the Cancer Center, please go directly to the Cancer Center and check in at the registration area.   Wear comfortable clothing and clothing appropriate for easy access to any Portacath or PICC line.   We strive to give you quality time with your provider. You may need to reschedule your appointment if you arrive late (15 or more minutes).  Arriving late affects you and other patients whose appointments are after yours.  Also, if you miss three or more appointments without notifying the office, you may be dismissed from the clinic at the provider's discretion.      For prescription refill requests, have your pharmacy contact our office and allow 72 hours for refills to be completed.    Today you received the following chemotherapy and/or immunotherapy agents trastuzumab, docetaxel, carboplatin      To help prevent nausea and vomiting after your treatment, we encourage you to take your nausea medication as directed.  BELOW ARE SYMPTOMS THAT SHOULD BE REPORTED IMMEDIATELY: *FEVER GREATER THAN 100.4 F (38 C) OR HIGHER *CHILLS OR SWEATING *NAUSEA AND VOMITING THAT IS NOT CONTROLLED WITH YOUR NAUSEA MEDICATION *UNUSUAL SHORTNESS OF BREATH *UNUSUAL BRUISING OR BLEEDING *URINARY PROBLEMS (pain or burning when urinating, or frequent urination) *BOWEL PROBLEMS (unusual diarrhea, constipation, pain near the anus) TENDERNESS IN MOUTH AND THROAT WITH OR WITHOUT PRESENCE OF ULCERS (sore throat, sores in mouth, or a toothache) UNUSUAL RASH, SWELLING OR PAIN  UNUSUAL VAGINAL DISCHARGE OR ITCHING   Items with * indicate a potential emergency and should be followed up as soon as possible or go to the Emergency Department if any problems should occur.  Please show the CHEMOTHERAPY  ALERT CARD or IMMUNOTHERAPY ALERT CARD at check-in to the Emergency Department and triage nurse.  Should you have questions after your visit or need to cancel or reschedule your appointment, please contact [facility] - A DEPT OF [facility]  Dept: [phone]  and follow the prompts.  Office hours are 8:00 a.m. to 4:30 p.m. Monday - Friday. Please note that voicemails left after 4:00 p.m. may not be returned until the following business day.  We are closed weekends and major holidays. You have access to a nurse at all times for urgent questions. Please call the main number to the clinic Dept: [phone] and follow the prompts.   For any non-urgent questions, you may also contact your provider using MyChart. We now offer e-Visits for anyone 15 and older to request care online for non-urgent symptoms. For details visit [email or web address].   Also download the MyChart app! Go to the app store, search MyChart, open the app, select [place], and log in with your MyChart username and password.

## 2024-09-14 NOTE — Progress Notes (Signed)
 [facility]   [phone]) [phone] [phone]) [phone]   Clinic Follow up Note   Patient Care Team: Rollene Almarie LABOR, MD as PCP - General (Internal Medicine) Lavona Agent, MD as PCP - Cardiology (Cardiology) [name], Aleene PARAS, MD (Inactive) (Cardiology) Dwane Lerner, MD (Dermatology) Waddell Danelle ORN, MD (Cardiology) Obie Princella HERO, MD (Inactive) (Gastroenterology) Claudene Arthea HERO, DO (Sports Medicine) Oman, Heather, OD as Consulting Physician (Optometry) Szabat, Toribio BROCKS, [facility] (Inactive) (Pharmacist) [name], Pasco RAMAN, DPM as Consulting Physician (Podiatry) Thapa, Sudan, MD as Consulting Physician (Endocrinology) Gerome, Devere HERO, RN as Oncology Nurse Navigator Tyree Nanetta SAILOR, RN as Oncology Nurse Navigator Vernetta Berg, MD as Consulting Physician (General Surgery) Lanny Callander, MD as Consulting Physician (Hematology) Dewey Rush, MD as Consulting Physician (Radiation Oncology)  Date of Service:  09/14/2024  CHIEF COMPLAINT: f/u of breast cancer  CURRENT THERAPY:  Adjuvant chemotherapy TCH every 21 days  Oncology History   Malignant neoplasm of upper-outer quadrant of left breast in female, estrogen receptor positive (HCC) pT2N0M0, stage IA,G2 invasive lobular carcinoma, triple positive -Discovered during screening mammogram. 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  4 sentinel lymph nodes were negative.  Margins were negative for invasive carcinoma, but positive for LCIS. -I recommend adjuvant chemotherapy and anti-HER2 therapy TCH every 3 weeksX6 followed by trastuzumab maintenance therapy to complete a 1 year therapy. She started on 08/25/2024  Assessment & Plan Malignant neoplasm of upper-outer quadrant of left breast Undergoing chemotherapy for invasive cancer in the left breast. Pleomorphic cells present, not invasive. Chemotherapy targets invasive  cancer, not LCIS. MRI shows no evidence of pleomorphic cells in the right breast or elsewhere. Surgery planned post-chemotherapy to address invasive cancer and ensure no residual invasive cancer in the breast. Radiation therapy considered if mastectomy is not performed. Mastectomy may eliminate need for radiation. LCIS not a serious problem but often associated with invasive cancer. - Continue current chemotherapy regimen. - Plan for second surgery after completion of chemotherapy. - Will consider radiation therapy if mastectomy is not performed. - Will discuss surgical options with surgeon and reconstructive specialist.  Chemotherapy-induced anemia Mild anemia secondary to chemotherapy, expected and not severe. White blood cell count slightly elevated due to recent shot, normal. Platelet count normal. - Ensure adequate hydration and nutrition.  Plan - She has recovered well from first cycle chemotherapy - Lab reviewed, adequate for treatment, will proceed with cycle 2 TCH at the same dose today with G-CSF on day 3 - Lab and follow-up in 3 weeks before cycle 3 chemo   SUMMARY OF ONCOLOGIC HISTORY: Oncology History  Malignant neoplasm of upper-outer quadrant of left breast in female, estrogen receptor positive (HCC)  06/22/2024 Cancer Staging   Staging form: Breast, AJCC 8th Edition - Clinical stage from 06/22/2024: Stage IB (cT2, cN0, cM0, G2, ER+, PR+, HER2+) - Signed by Lanny Callander, MD on 06/30/2024 Stage prefix: Initial diagnosis Histologic grading system: 3 grade system   06/28/2024 Initial Diagnosis   Malignant neoplasm of upper-outer quadrant of left breast in female, estrogen receptor positive (HCC)   07/27/2024 Cancer Staging   Staging form: Breast, AJCC 8th Edition - Pathologic stage from 07/27/2024: Stage IA (pT2, pN0, cM0, G2, ER+, PR+, HER2+) - Signed by Lanny Callander, MD on 08/08/2024 Stage prefix: Initial diagnosis Histologic grading system: 3 grade system Residual tumor (R): R0    08/25/2024 -  Chemotherapy   Patient is on Treatment Plan :  BREAST Docetaxel + Carboplatin + Trastuzumab (TCH) q21d / Trastuzumab q21d        Discussed the use of AI scribe software for clinical note transcription with the patient, who gave verbal consent to proceed.  History of Present Illness [name] is a 78 year old female with breast cancer who presents for follow-up.  She is undergoing chemotherapy and initially experienced significant weight loss of approximately ten pounds. Her appetite has returned, and she has regained some weight, though it remains slightly below her last visit. Her weight is stable, and she anticipates fluctuations with each chemotherapy session. She takes medication for nausea and focuses on hydration and nutrition, though she sometimes skips lunch.  Her blood counts reveal a slightly elevated white count, mild anemia, and normal platelet counts. She uses the DigniCap to manage hair loss, which has been effective so far. She is curious about potential hair loss after her second chemotherapy session.  She is considering surgery and radiation options and has consulted with a surgeon and a reconstructive doctor regarding a potential mastectomy.     All other systems were reviewed with the patient and are negative.  MEDICAL HISTORY:  Past Medical History:  Diagnosis Date   Arthritis    OA   Atrial flutter (HCC)    a. s/p TEE/DCCV 08/16/13 (normal LV function, no LAA thrombus).b. s/p ablation by Dr Waddell 09-23-2013   Coronary artery disease    2009 LAD Promus stent   Dysrhythmia    Aflutter, no issues after ablation 2014   Fibroid    Hyperlipidemia    Hypertension    Insulin  pump in place    Osteopenia    Type 1 diabetes mellitus on insulin  therapy (HCC)    Valvular heart disease    a. Mild MR/TR by TEE 08/2013.    SURGICAL HISTORY: Past Surgical History:  Procedure Laterality Date   ABLATION  09/23/2013   CTI by Dr Waddell    APPENDECTOMY     APPLICATION OF WOUND VAC Left 04/11/2017   Procedure: APPLICATION OF WOUND VAC LEFT KNEE;  Surgeon: Fidel Rogue, MD;  Location: [facility] OR;  Service: Orthopedics;  Laterality: Left;   ATRIAL FLUTTER ABLATION N/A 09/23/2013   Procedure: ATRIAL FLUTTER ABLATION;  Surgeon: Danelle LELON Waddell, MD;  Location: [facility] CATH LAB;  Service: Cardiovascular;  Laterality: N/A;   CARDIOVERSION N/A 08/16/2013   Procedure: CARDIOVERSION;  Surgeon: Aleene JINNY Passe, MD;  Location: [facility] ENDOSCOPY;  Service: Cardiovascular;  Laterality: N/A;  spoke with Tom   CATARACT EXTRACTION Bilateral    COLONOSCOPY     CORONARY ANGIOPLASTY WITH STENT PLACEMENT     EYE SURGERY     Cataract   LASER ABLATION Right 11/20/2023   Right leg laser ablation of the Greater saphenous vein with >20 stab phlebectomies   MYOMECTOMY  1977   ORIF PATELLA Left 04/11/2017   ORIF PATELLA Left 04/11/2017   Procedure: OPEN REDUCTION INTERNAL (ORIF) FIXATION PATELLA LEFT KNEE;  Surgeon: Fidel Rogue, MD;  Location: [facility] OR;  Service: Orthopedics;  Laterality: Left;   PELVIC LAPAROSCOPY     PORTACATH PLACEMENT Right 07/27/2024   Procedure: INSERTION OF TUNNELED CENTRAL VENOUS DEVICE WITH PORT;  Surgeon: Vernetta Berg, MD;  Location: [facility];  Service: General;  Laterality: Right;  PORT-A-CATH INSERTION WITH ULTRASOUND GUIDANCE   SENTINEL NODE BIOPSY Left 07/27/2024   Procedure: LEFT SENTINEL LYMPH NODE BIOPSY;  Surgeon: Vernetta Berg, MD;  Location: [facility];  Service: General;  Laterality: Left;  SENTINEL NODE BIOPSY   TEE WITHOUT CARDIOVERSION N/A 08/16/2013   Procedure: TRANSESOPHAGEAL ECHOCARDIOGRAM (TEE);  Surgeon: Aleene JINNY Passe, MD;  [facility];  Service: Cardiovascular;  Laterality: N/A;   TUBAL LIGATION  1988    I have reviewed the social history and family history with the patient and they are unchanged from previous note.  ALLERGIES:  is allergic to sulfa antibiotics  and sulfamethoxazole-trimethoprim.  MEDICATIONS:  Current Outpatient Medications  Medication Sig Dispense Refill   Calcium  Carbonate-Vitamin D  (CALCIUM  + D PO) Take 600 mg by mouth 2 (two) times daily.     cholecalciferol  (VITAMIN D ) 1000 units tablet Take 1,000 Units by mouth 2 (two) times daily.      clopidogrel  (PLAVIX ) 75 MG tablet TAKE 1 TABLET DAILY 90 tablet 3   Continuous Glucose Sensor (DEXCOM G7 15 DAY SENSOR) MISC by Does not apply route.     cyanocobalamin  (VITAMIN B12) 1000 MCG tablet Take 1,000 mcg by mouth daily.     dexamethasone  (DECADRON ) 4 MG tablet Take 1 tabs by mouth 2 times daily starting day before chemo. Then take 1 tabs daily for 2 days starting day after chemo. Take with food. 30 tablet 1   ezetimibe  (ZETIA ) 10 MG tablet Take 1 tablet (10 mg total) by mouth daily. 90 tablet 3   gabapentin  (NEURONTIN ) 100 MG capsule Take 2 capsules (200 mg total) by mouth at bedtime. 180 capsule 0   Insulin  Lispro-aabc (LYUMJEV ) 100 UNIT/ML SOLN USE A MAXIMUM OF 90 UNITS DAILY VIA INSULIN  PUMP 80 mL 3   lidocaine -prilocaine (EMLA) cream Apply to affected area once 30 g 3   losartan  (COZAAR ) 100 MG tablet Take 1 tablet (100 mg total) by mouth daily. 90 tablet 3   metFORMIN  (GLUCOPHAGE -XR) 500 MG 24 hr tablet TAKE 1 TABLET IN THE MORNING AND 2 TABLETS IN THE EVENING AS DIRECTED 270 tablet 3   metoprolol  succinate (TOPROL -XL) 100 MG 24 hr tablet TAKE 1 TABLET DAILY 90 tablet 3   Multiple Vitamins-Minerals (PRESERVISION AREDS 2 PO) Take by mouth in the morning and at bedtime.     nitroGLYCERIN  (NITROSTAT ) 0.4 MG SL tablet DISSOLVE 1 TABLET UNDER THE TONGUE EVERY 5 MINUTES AS NEEDED FOR CHEST PAIN 75 tablet 2   ondansetron  (ZOFRAN ) 8 MG tablet Take 1 tablet (8 mg total) by mouth every 8 (eight) hours as needed for nausea or vomiting. Start on the third day after chemotherapy. 30 tablet 1   prochlorperazine (COMPAZINE) 10 MG tablet Take 1 tablet (10 mg total) by mouth every 6 (six) hours as  needed for nausea or vomiting. 30 tablet 1   Pyridoxine HCl (VITAMIN B6) 100 MG TABS      rosuvastatin  (CRESTOR ) 40 MG tablet Take 1 tablet (40 mg total) by mouth daily. 90 tablet 3   solifenacin  (VESICARE ) 10 MG tablet TAKE 1 TABLET DAILY 90 tablet 3   spironolactone  (ALDACTONE ) 25 MG tablet Take 1 tablet (25 mg total) by mouth daily. 90 tablet 3   TART CHERRY PO Take by mouth.     traMADol  (ULTRAM ) 50 MG tablet Take 1 tablet (50 mg total) by mouth every 6 (six) hours as needed for moderate pain (pain score 4-6) or severe pain (pain score 7-10). 25 tablet 0   No current facility-administered medications for this visit.   Facility-Administered Medications Ordered in Other Visits  Medication Dose Route Frequency Provider Last Rate Last Admin   0.9 %  sodium chloride  infusion  Intravenous Continuous Lanny Callander, MD 10 mL/hr at 09/14/24 1033 New Bag at 09/14/24 1033   aprepitant (CINVANTI) injection 130 mg  130 mg Intravenous Once Lanny Callander, MD       CARBOplatin (PARAPLATIN) 460 mg in sodium chloride  0.9 % 250 mL chemo infusion  460 mg Intravenous Once Lanny Callander, MD       DOCEtaxel (TAXOTERE) 160 mg in sodium chloride  0.9 % 250 mL chemo infusion  75 mg/m2 (Treatment Plan Recorded) Intravenous Once Lanny Callander, MD       trastuzumab-dttb (ONTRUZANT) 525 mg in sodium chloride  0.9 % 250 mL chemo infusion  6 mg/kg (Treatment Plan Recorded) Intravenous Once Lanny Callander, MD        PHYSICAL EXAMINATION: ECOG PERFORMANCE STATUS: 0 - Asymptomatic  Vitals:   09/14/24 0900  BP: (!) 133/59  Pulse: 64  Resp: 16  Temp: (!) 97.2 F (36.2 C)  SpO2: 100%   Wt Readings from Last 3 Encounters:  09/14/24 193 lb 11.2 oz (87.9 kg)  08/24/24 197 lb 4.8 oz (89.5 kg)  08/23/24 195 lb (88.5 kg)     GENERAL:alert, no distress and comfortable SKIN: skin color, texture, turgor are normal, no rashes or significant lesions EYES: normal, Conjunctiva are pink and non-injected, sclera clear NECK: supple, thyroid  normal  size, non-tender, without nodularity LYMPH:  no palpable lymphadenopathy in the cervical, axillary  LUNGS: clear to auscultation and percussion with normal breathing effort HEART: regular rate & rhythm and no murmurs and no lower extremity edema ABDOMEN:abdomen soft, non-tender and normal bowel sounds Musculoskeletal:no cyanosis of digits and no clubbing  NEURO: alert & oriented x 3 with fluent speech, no focal motor/sensory deficits  Physical Exam    LABORATORY DATA:  I have reviewed the data as listed    Latest Ref Rng & Units 09/14/2024    8:53 AM 08/24/2024    9:08 AM 06/30/2024    1:29 PM  CBC  WBC 4.0 - 10.5 K/uL 14.3  6.0  6.2   Hemoglobin 12.0 - 15.0 g/dL 88.3  85.8  84.1   Hematocrit 36.0 - 46.0 % 34.0  41.1  47.0   Platelets 150 - 400 K/uL 358  277  331         Latest Ref Rng & Units 09/14/2024    8:53 AM 09/03/2024   10:24 AM 08/24/2024    9:08 AM  CMP  Glucose 70 - 99 mg/dL 861  878  68   BUN 8 - 23 mg/dL 16  18  17    Creatinine 0.44 - 1.00 mg/dL 9.36  8.89  9.31   Sodium 135 - 145 mmol/L 137  136  139   Potassium 3.5 - 5.1 mmol/L 4.2  4.7  4.3   Chloride 98 - 111 mmol/L 106  92  105   CO2 22 - 32 mmol/L 25  24  29    Calcium  8.9 - 10.3 mg/dL 9.7  88.8  9.9   Total Protein 6.5 - 8.1 g/dL 6.1   6.8   Total Bilirubin 0.0 - 1.2 mg/dL 0.4   0.4   Alkaline Phos 38 - 126 U/L 104   85   AST 15 - 41 U/L 21   29   ALT 0 - 44 U/L 37   37       RADIOGRAPHIC STUDIES: I have personally reviewed the radiological images as listed and agreed with the findings in the report. No results found.    Orders Placed This Encounter  Procedures   CBC with Differential (Cancer Center Only)    Standing Status:   Future    Expected Date:   11/23/2024    Expiration Date:   11/23/2025   CMP (Cancer Center only)    Standing Status:   Future    Expected Date:   11/23/2024    Expiration Date:   11/23/2025   CBC with Differential (Cancer Center Only)    Standing Status:   Future     Expected Date:   12/14/2024    Expiration Date:   12/14/2025   CMP (Cancer Center only)    Standing Status:   Future    Expected Date:   12/14/2024    Expiration Date:   12/14/2025   All questions were answered. The patient knows to call the clinic with any problems, questions or concerns. No barriers to learning was detected. The total time spent in the appointment was 25 minutes, including review of chart and various tests results, discussions about plan of care and coordination of care plan     Onita Mattock, MD 09/14/2024

## 2024-09-16 ENCOUNTER — Encounter: Payer: Self-pay | Admitting: Hematology

## 2024-09-16 ENCOUNTER — Inpatient Hospital Stay

## 2024-09-16 VITALS — BP 147/74 | HR 70 | Resp 16

## 2024-09-16 DIAGNOSIS — Z1721 Progesterone receptor positive status: Secondary | ICD-10-CM | POA: Diagnosis not present

## 2024-09-16 DIAGNOSIS — Z79899 Other long term (current) drug therapy: Secondary | ICD-10-CM | POA: Diagnosis not present

## 2024-09-16 DIAGNOSIS — T451X5A Adverse effect of antineoplastic and immunosuppressive drugs, initial encounter: Secondary | ICD-10-CM | POA: Diagnosis not present

## 2024-09-16 DIAGNOSIS — C50412 Malignant neoplasm of upper-outer quadrant of left female breast: Secondary | ICD-10-CM

## 2024-09-16 DIAGNOSIS — Z1732 Human epidermal growth factor receptor 2 negative status: Secondary | ICD-10-CM | POA: Diagnosis not present

## 2024-09-16 DIAGNOSIS — Z5111 Encounter for antineoplastic chemotherapy: Secondary | ICD-10-CM | POA: Diagnosis not present

## 2024-09-16 DIAGNOSIS — Z170 Estrogen receptor positive status [ER+]: Secondary | ICD-10-CM | POA: Diagnosis not present

## 2024-09-16 LAB — BASIC METABOLIC PANEL WITH GFR
BUN/Creatinine Ratio: 20 (ref 12–28)
BUN: 13 mg/dL (ref 8–27)
CO2: 22 mmol/L (ref 20–29)
Calcium: 9.3 mg/dL (ref 8.7–10.3)
Chloride: 102 mmol/L (ref 96–106)
Creatinine, Ser: 0.65 mg/dL (ref 0.57–1.00)
Glucose: 111 mg/dL — ABNORMAL HIGH (ref 70–99)
Potassium: 4.4 mmol/L (ref 3.5–5.2)
Sodium: 137 mmol/L (ref 134–144)
eGFR: 91 mL/min/1.73 (ref >59)

## 2024-09-16 MED ORDER — PEGFILGRASTIM-FPGK 6 MG/0.6ML ~~LOC~~ SOSY
6.0000 mg | PREFILLED_SYRINGE | Freq: Once | SUBCUTANEOUS | Status: AC
  Administered 2024-09-16: 6 mg via SUBCUTANEOUS
  Filled 2024-09-16: qty 0.6

## 2024-09-17 ENCOUNTER — Inpatient Hospital Stay

## 2024-09-21 ENCOUNTER — Encounter: Payer: Self-pay | Admitting: Hematology

## 2024-09-21 ENCOUNTER — Other Ambulatory Visit: Payer: Self-pay

## 2024-09-22 ENCOUNTER — Other Ambulatory Visit: Payer: Self-pay

## 2024-09-27 ENCOUNTER — Encounter: Payer: Self-pay | Admitting: Hematology

## 2024-10-01 ENCOUNTER — Encounter: Payer: Self-pay | Admitting: *Deleted

## 2024-10-05 ENCOUNTER — Inpatient Hospital Stay

## 2024-10-05 ENCOUNTER — Other Ambulatory Visit: Payer: Self-pay

## 2024-10-05 ENCOUNTER — Inpatient Hospital Stay: Attending: Hematology

## 2024-10-05 ENCOUNTER — Inpatient Hospital Stay: Admitting: Hematology

## 2024-10-05 VITALS — BP 150/70 | HR 77 | Temp 97.4°F | Resp 16

## 2024-10-05 DIAGNOSIS — Z7984 Long term (current) use of oral hypoglycemic drugs: Secondary | ICD-10-CM | POA: Diagnosis not present

## 2024-10-05 DIAGNOSIS — Z9221 Personal history of antineoplastic chemotherapy: Secondary | ICD-10-CM | POA: Insufficient documentation

## 2024-10-05 DIAGNOSIS — C50412 Malignant neoplasm of upper-outer quadrant of left female breast: Secondary | ICD-10-CM | POA: Diagnosis not present

## 2024-10-05 DIAGNOSIS — I10 Essential (primary) hypertension: Secondary | ICD-10-CM | POA: Insufficient documentation

## 2024-10-05 DIAGNOSIS — T451X5A Adverse effect of antineoplastic and immunosuppressive drugs, initial encounter: Secondary | ICD-10-CM | POA: Diagnosis not present

## 2024-10-05 DIAGNOSIS — R9431 Abnormal electrocardiogram [ECG] [EKG]: Secondary | ICD-10-CM

## 2024-10-05 DIAGNOSIS — M1990 Unspecified osteoarthritis, unspecified site: Secondary | ICD-10-CM | POA: Diagnosis not present

## 2024-10-05 DIAGNOSIS — Z170 Estrogen receptor positive status [ER+]: Secondary | ICD-10-CM

## 2024-10-05 DIAGNOSIS — Z5111 Encounter for antineoplastic chemotherapy: Secondary | ICD-10-CM | POA: Insufficient documentation

## 2024-10-05 DIAGNOSIS — Z7952 Long term (current) use of systemic steroids: Secondary | ICD-10-CM | POA: Insufficient documentation

## 2024-10-05 DIAGNOSIS — M8580 Other specified disorders of bone density and structure, unspecified site: Secondary | ICD-10-CM | POA: Diagnosis not present

## 2024-10-05 DIAGNOSIS — Z79899 Other long term (current) drug therapy: Secondary | ICD-10-CM | POA: Diagnosis not present

## 2024-10-05 DIAGNOSIS — I4891 Unspecified atrial fibrillation: Secondary | ICD-10-CM | POA: Insufficient documentation

## 2024-10-05 DIAGNOSIS — E869 Volume depletion, unspecified: Secondary | ICD-10-CM | POA: Insufficient documentation

## 2024-10-05 DIAGNOSIS — D709 Neutropenia, unspecified: Secondary | ICD-10-CM | POA: Insufficient documentation

## 2024-10-05 DIAGNOSIS — Z794 Long term (current) use of insulin: Secondary | ICD-10-CM | POA: Diagnosis not present

## 2024-10-05 DIAGNOSIS — E785 Hyperlipidemia, unspecified: Secondary | ICD-10-CM | POA: Diagnosis not present

## 2024-10-05 DIAGNOSIS — Z5189 Encounter for other specified aftercare: Secondary | ICD-10-CM | POA: Diagnosis not present

## 2024-10-05 LAB — CBC WITH DIFFERENTIAL (CANCER CENTER ONLY)
Abs Immature Granulocytes: 0.12 K/uL — ABNORMAL HIGH (ref 0.00–0.07)
Basophils Absolute: 0 K/uL (ref 0.0–0.1)
Basophils Relative: 0 %
Eosinophils Absolute: 0 K/uL (ref 0.0–0.5)
Eosinophils Relative: 0 %
HCT: 32.2 % — ABNORMAL LOW (ref 36.0–46.0)
Hemoglobin: 10.7 g/dL — ABNORMAL LOW (ref 12.0–15.0)
Immature Granulocytes: 1 %
Lymphocytes Relative: 10 %
Lymphs Abs: 1.2 K/uL (ref 0.7–4.0)
MCH: 29.9 pg (ref 26.0–34.0)
MCHC: 33.2 g/dL (ref 30.0–36.0)
MCV: 89.9 fL (ref 80.0–100.0)
Monocytes Absolute: 0.9 K/uL (ref 0.1–1.0)
Monocytes Relative: 7 %
Neutro Abs: 9.6 K/uL — ABNORMAL HIGH (ref 1.7–7.7)
Neutrophils Relative %: 82 %
Platelet Count: 435 K/uL — ABNORMAL HIGH (ref 150–400)
RBC: 3.58 MIL/uL — ABNORMAL LOW (ref 3.87–5.11)
RDW: 14.6 % (ref 11.5–15.5)
WBC Count: 11.8 K/uL — ABNORMAL HIGH (ref 4.0–10.5)
nRBC: 0 % (ref 0.0–0.2)

## 2024-10-05 LAB — CMP (CANCER CENTER ONLY)
ALT: 39 U/L (ref 0–44)
AST: 24 U/L (ref 15–41)
Albumin: 3.9 g/dL (ref 3.5–5.0)
Alkaline Phosphatase: 98 U/L (ref 38–126)
Anion gap: 12 (ref 5–15)
BUN: 23 mg/dL (ref 8–23)
CO2: 24 mmol/L (ref 22–32)
Calcium: 9.9 mg/dL (ref 8.9–10.3)
Chloride: 102 mmol/L (ref 98–111)
Creatinine: 0.94 mg/dL (ref 0.44–1.00)
GFR, Estimated: >60 mL/min (ref >60)
Glucose, Bld: 114 mg/dL — ABNORMAL HIGH (ref 70–99)
Potassium: 4.3 mmol/L (ref 3.5–5.1)
Sodium: 138 mmol/L (ref 135–145)
Total Bilirubin: 0.3 mg/dL (ref 0.0–1.2)
Total Protein: 6.4 g/dL — ABNORMAL LOW (ref 6.5–8.1)

## 2024-10-05 MED ORDER — SODIUM CHLORIDE 0.9 % IV SOLN: INTRAVENOUS | Status: DC

## 2024-10-05 MED ORDER — DIPHENHYDRAMINE HCL 25 MG PO CAPS
50.0000 mg | ORAL_CAPSULE | Freq: Once | ORAL | Status: AC
  Administered 2024-10-05: 50 mg via ORAL
  Filled 2024-10-05: qty 2

## 2024-10-05 MED ORDER — DEXAMETHASONE SOD PHOSPHATE PF 10 MG/ML IJ SOLN
10.0000 mg | Freq: Once | INTRAMUSCULAR | Status: AC
  Administered 2024-10-05: 10 mg via INTRAVENOUS

## 2024-10-05 MED ORDER — APREPITANT 130 MG/18ML IV EMUL
130.0000 mg | Freq: Once | INTRAVENOUS | Status: AC
  Administered 2024-10-05: 130 mg via INTRAVENOUS
  Filled 2024-10-05: qty 18

## 2024-10-05 MED ORDER — SODIUM CHLORIDE 0.9 % IV SOLN
75.0000 mg/m2 | Freq: Once | INTRAVENOUS | Status: AC
  Administered 2024-10-05: 160 mg via INTRAVENOUS
  Filled 2024-10-05: qty 16

## 2024-10-05 MED ORDER — PALONOSETRON HCL INJECTION 0.25 MG/5ML
0.2500 mg | Freq: Once | INTRAVENOUS | Status: AC
  Administered 2024-10-05: 0.25 mg via INTRAVENOUS
  Filled 2024-10-05: qty 5

## 2024-10-05 MED ORDER — TRASTUZUMAB-DTTB CHEMO 150 MG IV SOLR
6.0000 mg/kg | Freq: Once | INTRAVENOUS | Status: AC
  Administered 2024-10-05: 525 mg via INTRAVENOUS
  Filled 2024-10-05: qty 25

## 2024-10-05 MED ORDER — SODIUM CHLORIDE 0.9 % IV SOLN
404.3600 mg | Freq: Once | INTRAVENOUS | Status: AC
  Administered 2024-10-05: 400 mg via INTRAVENOUS
  Filled 2024-10-05: qty 40

## 2024-10-05 MED ORDER — ACETAMINOPHEN 325 MG PO TABS
650.0000 mg | ORAL_TABLET | Freq: Once | ORAL | Status: AC
  Administered 2024-10-05: 650 mg via ORAL
  Filled 2024-10-05: qty 2

## 2024-10-05 NOTE — Assessment & Plan Note (Signed)
 pT2N0M0, stage IA,G2 invasive lobular carcinoma, triple positive -Discovered during screening mammogram. 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  4 sentinel lymph nodes were negative.  Margins were negative for invasive carcinoma, but positive for LCIS. -I recommend adjuvant chemotherapy and anti-HER2 therapy TCH every 3 weeksX6 followed by trastuzumab maintenance therapy to complete a 1 year therapy. She started on 08/25/2024

## 2024-10-05 NOTE — Patient Instructions (Signed)
 [facility] - A DEPT OF [facility]  Discharge Instructions: Thank you for choosing [facility] to provide your oncology and hematology care.   If you have a lab appointment with the Cancer Center, please go directly to the Cancer Center and check in at the registration area.   Wear comfortable clothing and clothing appropriate for easy access to any Portacath or PICC line.   We strive to give you quality time with your provider. You may need to reschedule your appointment if you arrive late (15 or more minutes).  Arriving late affects you and other patients whose appointments are after yours.  Also, if you miss three or more appointments without notifying the office, you may be dismissed from the clinic at the provider's discretion.      For prescription refill requests, have your pharmacy contact our office and allow 72 hours for refills to be completed.    Today you received the following chemotherapy and/or immunotherapy agents trastuzumab, docetaxel, carboplatin      To help prevent nausea and vomiting after your treatment, we encourage you to take your nausea medication as directed.  BELOW ARE SYMPTOMS THAT SHOULD BE REPORTED IMMEDIATELY: *FEVER GREATER THAN 100.4 F (38 C) OR HIGHER *CHILLS OR SWEATING *NAUSEA AND VOMITING THAT IS NOT CONTROLLED WITH YOUR NAUSEA MEDICATION *UNUSUAL SHORTNESS OF BREATH *UNUSUAL BRUISING OR BLEEDING *URINARY PROBLEMS (pain or burning when urinating, or frequent urination) *BOWEL PROBLEMS (unusual diarrhea, constipation, pain near the anus) TENDERNESS IN MOUTH AND THROAT WITH OR WITHOUT PRESENCE OF ULCERS (sore throat, sores in mouth, or a toothache) UNUSUAL RASH, SWELLING OR PAIN  UNUSUAL VAGINAL DISCHARGE OR ITCHING   Items with * indicate a potential emergency and should be followed up as soon as possible or go to the Emergency Department if any problems should occur.  Please show the CHEMOTHERAPY  ALERT CARD or IMMUNOTHERAPY ALERT CARD at check-in to the Emergency Department and triage nurse.  Should you have questions after your visit or need to cancel or reschedule your appointment, please contact [facility] - A DEPT OF [facility]  Dept: [phone]  and follow the prompts.  Office hours are 8:00 a.m. to 4:30 p.m. Monday - Friday. Please note that voicemails left after 4:00 p.m. may not be returned until the following business day.  We are closed weekends and major holidays. You have access to a nurse at all times for urgent questions. Please call the main number to the clinic Dept: [phone] and follow the prompts.   For any non-urgent questions, you may also contact your provider using MyChart. We now offer e-Visits for anyone 15 and older to request care online for non-urgent symptoms. For details visit [email or web address].   Also download the MyChart app! Go to the app store, search MyChart, open the app, select [place], and log in with your MyChart username and password.

## 2024-10-05 NOTE — Progress Notes (Signed)
 [facility]   [phone] [phone]   Clinic Follow up Note   Patient Care Team: Rollene Almarie LABOR, MD as PCP - General (Internal Medicine) Lavona Agent, MD as PCP - Cardiology (Cardiology) [name], Aleene PARAS, MD (Inactive) (Cardiology) Dwane Lerner, MD (Dermatology) Waddell Danelle ORN, MD (Cardiology) Obie Princella HERO, MD (Inactive) (Gastroenterology) Claudene Arthea HERO, DO (Sports Medicine) Oman, Heather, OD as Consulting Physician (Optometry) Szabat, Toribio BROCKS, [facility] (Inactive) (Pharmacist) [name], Pasco RAMAN, DPM as Consulting Physician (Podiatry) Thapa, Sudan, MD as Consulting Physician (Endocrinology) Gerome, Devere HERO, RN as Oncology Nurse Navigator Tyree Nanetta SAILOR, RN as Oncology Nurse Navigator Vernetta Berg, MD as Consulting Physician (General Surgery) Lanny Callander, MD as Consulting Physician (Hematology) Dewey Rush, MD as Consulting Physician (Radiation Oncology)  Date of Service:  10/05/2024  CHIEF COMPLAINT: f/u of left cancer  CURRENT THERAPY:  Adjuvant chemotherapy TCH every 3 weeks  Oncology History   Malignant neoplasm of upper-outer quadrant of left breast in female, estrogen receptor positive (HCC) pT2N0M0, stage IA,G2 invasive lobular carcinoma, triple positive -Discovered during screening mammogram. 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  4 sentinel lymph nodes were negative.  Margins were negative for invasive carcinoma, but positive for LCIS. -I recommend adjuvant chemotherapy and anti-HER2 therapy TCH every 3 weeksX6 followed by trastuzumab  maintenance therapy to complete a 1 year therapy. She started on 08/25/2024   Assessment & Plan Malignant neoplasm of upper-outer quadrant of left breast undergoing chemotherapy Currently undergoing chemotherapy with significant fatigue and weight loss. Reports difficulty with recovery  post-chemotherapy, particularly after the second cycle, which was harder than the first. Experiences nausea, managed with medication, and mild diarrhea, controlled with Imodium. Fatigue is likely exacerbated by chemotherapy and possibly dehydration. Blood counts show hemoglobin at 10.7, slightly lower than before, and platelet count is normal. White blood cell count is slightly elevated due to growth factor shot. - Reduced chemotherapy dose by 10-15% to manage fatigue and low blood counts. - Continue growth factor shot to support white blood cell count. - Encouraged increased fluid intake to prevent dehydration. - Scheduled next chemotherapy session for December 30th.  Chemotherapy-induced fatigue Fatigue is significant, particularly in the first week post-chemotherapy, likely due to chemotherapy and possibly dehydration. - Reduced chemotherapy dose by 10-15% to manage fatigue. - Encouraged increased fluid intake to prevent dehydration. - Extended steroid use to Wednesday, Thursday, Friday, and possibly Saturday to aid recovery.  Chemotherapy-induced weight loss Experienced weight loss, with current weight at 187 lbs, down from over 190 lbs. Weight loss attributed to fluid loss rather than food intake. Advised against excessive weight loss during chemotherapy to prevent fatigue and inability to perform daily activities. - Encouraged adequate nutrition and hydration to prevent further weight loss.  Chemotherapy-induced neutropenia monitoring Neutropenia is being monitored with blood counts. Hemoglobin is slightly lower at 10.7, and white blood cell count is slightly elevated due to growth factor shot. Platelet count is normal. Discussed possibility of holding growth factor shot if chemotherapy dose is reduced, with a 50% chance of adequate recovery without it. - Continue growth factor shot to support white blood cell count. - Monitor blood counts closely to assess need for growth factor  shot.  Plan - Due to her extreme fatigue after chemotherapy and prolonged recovery, I will slightly reduce carboplatin  dose from AUC 5 to 4.5 today, continue G-CSF on day 3 - Repeat echocardiogram later this week -  Follow-up in 3 weeks before next cycle chemo    SUMMARY OF ONCOLOGIC HISTORY: Oncology History  Malignant neoplasm of upper-outer quadrant of left breast in female, estrogen receptor positive (HCC)  06/22/2024 Cancer Staging   Staging form: Breast, AJCC 8th Edition - Clinical stage from 06/22/2024: Stage IB (cT2, cN0, cM0, G2, ER+, PR+, HER2+) - Signed by Lanny Callander, MD on 06/30/2024 Stage prefix: Initial diagnosis Histologic grading system: 3 grade system   06/28/2024 Initial Diagnosis   Malignant neoplasm of upper-outer quadrant of left breast in female, estrogen receptor positive (HCC)   07/27/2024 Cancer Staging   Staging form: Breast, AJCC 8th Edition - Pathologic stage from 07/27/2024: Stage IA (pT2, pN0, cM0, G2, ER+, PR+, HER2+) - Signed by Lanny Callander, MD on 08/08/2024 Stage prefix: Initial diagnosis Histologic grading system: 3 grade system Residual tumor (R): R0   08/25/2024 -  Chemotherapy   Patient is on Treatment Plan : BREAST Docetaxel  + Carboplatin  + Trastuzumab  (TCH) q21d / Trastuzumab  q21d        Discussed the use of AI scribe software for clinical note transcription with the patient, who gave verbal consent to proceed.  History of Present Illness [name] is a 78 year old female with breast cancer who presents for follow-up.  She is undergoing chemotherapy and has significant fatigue in the first week after each cycle, often confined to bed, with more difficulty and longer recovery after the second cycle. She feels best in the third week.  Her weight decreased from 193 to 187 pounds since the last visit, with 182 pounds on her home scale. She thinks some of this is fluid loss rather than poor intake. Her appetite is reduced in the first week after  chemotherapy but improves later.  She has no numbness or tingling and uses ice to reduce neuropathy risk. She has mild post-chemotherapy diarrhea controlled with Imodium and is working to increase oral fluids, mainly Gatorade and water.  Nausea occurs intermittently in the first week after chemotherapy and is controlled with medication, with no significant vomiting. She monitors blood sugar while on steroids and uses an insulin  pump.  She finds the post-chemotherapy injection difficult because of its side effects.     All other systems were reviewed with the patient and are negative.  MEDICAL HISTORY:  Past Medical History:  Diagnosis Date   Arthritis    OA   Atrial flutter (HCC)    a. s/p TEE/DCCV 08/16/13 (normal LV function, no LAA thrombus).b. s/p ablation by Dr Waddell 09-23-2013   Coronary artery disease    2009 LAD Promus stent   Dysrhythmia    Aflutter, no issues after ablation 2014   Fibroid    Hyperlipidemia    Hypertension    Insulin  pump in place    Osteopenia    Type 1 diabetes mellitus on insulin  therapy (HCC)    Valvular heart disease    a. Mild MR/TR by TEE 08/2013.    SURGICAL HISTORY: Past Surgical History:  Procedure Laterality Date   ABLATION  09/23/2013   CTI by Dr Waddell   APPENDECTOMY     APPLICATION OF WOUND VAC Left 04/11/2017   Procedure: APPLICATION OF WOUND VAC LEFT KNEE;  Surgeon: Fidel Rogue, MD;  Location: [facility] OR;  Service: Orthopedics;  Laterality: Left;   ATRIAL FLUTTER ABLATION N/A 09/23/2013   Procedure: ATRIAL FLUTTER ABLATION;  Surgeon: Danelle LELON Waddell, MD;  Location: [facility] CATH LAB;  Service: Cardiovascular;  Laterality: N/A;   CARDIOVERSION N/A  08/16/2013   Procedure: CARDIOVERSION;  Surgeon: Aleene JINNY Passe, MD;  Location: [facility] ENDOSCOPY;  Service: Cardiovascular;  Laterality: N/A;  spoke with Tom   CATARACT EXTRACTION Bilateral    COLONOSCOPY     CORONARY ANGIOPLASTY WITH STENT PLACEMENT     EYE SURGERY     Cataract   LASER  ABLATION Right 11/20/2023   Right leg laser ablation of the Greater saphenous vein with >20 stab phlebectomies   MYOMECTOMY  1977   ORIF PATELLA Left 04/11/2017   ORIF PATELLA Left 04/11/2017   Procedure: OPEN REDUCTION INTERNAL (ORIF) FIXATION PATELLA LEFT KNEE;  Surgeon: Fidel Rogue, MD;  Location: [facility] OR;  Service: Orthopedics;  Laterality: Left;   PELVIC LAPAROSCOPY     PORTACATH PLACEMENT Right 07/27/2024   Procedure: INSERTION OF TUNNELED CENTRAL VENOUS DEVICE WITH PORT;  Surgeon: Vernetta Berg, MD;  Location: [facility];  Service: General;  Laterality: Right;  PORT-A-CATH INSERTION WITH ULTRASOUND GUIDANCE   SENTINEL NODE BIOPSY Left 07/27/2024   Procedure: LEFT SENTINEL LYMPH NODE BIOPSY;  Surgeon: Vernetta Berg, MD;  Location: [facility];  Service: General;  Laterality: Left;  SENTINEL NODE BIOPSY   TEE WITHOUT CARDIOVERSION N/A 08/16/2013   Procedure: TRANSESOPHAGEAL ECHOCARDIOGRAM (TEE);  Surgeon: Aleene JINNY Passe, MD;  Location: [facility] ENDOSCOPY;  Service: Cardiovascular;  Laterality: N/A;   TUBAL LIGATION  1988    I have reviewed the social history and family history with the patient and they are unchanged from previous note.  ALLERGIES:  is allergic to sulfa antibiotics and sulfamethoxazole-trimethoprim.  MEDICATIONS:  Current Outpatient Medications  Medication Sig Dispense Refill   Calcium  Carbonate-Vitamin D  (CALCIUM  + D PO) Take 600 mg by mouth 2 (two) times daily.     cholecalciferol  (VITAMIN D ) 1000 units tablet Take 1,000 Units by mouth 2 (two) times daily.      clopidogrel  (PLAVIX ) 75 MG tablet TAKE 1 TABLET DAILY 90 tablet 3   Continuous Glucose Sensor (DEXCOM G7 15 DAY SENSOR) MISC by Does not apply route.     cyanocobalamin  (VITAMIN B12) 1000 MCG tablet Take 1,000 mcg by mouth daily.     dexamethasone  (DECADRON ) 4 MG tablet Take 1 tabs by mouth 2 times daily starting day before chemo. Then take 1 tabs daily for 2 days starting day  after chemo. Take with food. 30 tablet 1   ezetimibe  (ZETIA ) 10 MG tablet Take 1 tablet (10 mg total) by mouth daily. 90 tablet 3   gabapentin  (NEURONTIN ) 100 MG capsule Take 2 capsules (200 mg total) by mouth at bedtime. 180 capsule 0   Insulin  Lispro-aabc (LYUMJEV ) 100 UNIT/ML SOLN USE A MAXIMUM OF 90 UNITS DAILY VIA INSULIN  PUMP 80 mL 3   lidocaine -prilocaine  (EMLA ) cream Apply to affected area once 30 g 3   losartan  (COZAAR ) 100 MG tablet Take 1 tablet (100 mg total) by mouth daily. 90 tablet 3   metFORMIN  (GLUCOPHAGE -XR) 500 MG 24 hr tablet TAKE 1 TABLET IN THE MORNING AND 2 TABLETS IN THE EVENING AS DIRECTED 270 tablet 3   metoprolol  succinate (TOPROL -XL) 100 MG 24 hr tablet TAKE 1 TABLET DAILY 90 tablet 3   Multiple Vitamins-Minerals (PRESERVISION AREDS 2 PO) Take by mouth in the morning and at bedtime.     nitroGLYCERIN  (NITROSTAT ) 0.4 MG SL tablet DISSOLVE 1 TABLET UNDER THE TONGUE EVERY 5 MINUTES AS NEEDED FOR CHEST PAIN 75 tablet 2   ondansetron  (ZOFRAN ) 8 MG tablet Take 1 tablet (8 mg total) by mouth every  8 (eight) hours as needed for nausea or vomiting. Start on the third day after chemotherapy. 30 tablet 1   prochlorperazine  (COMPAZINE ) 10 MG tablet Take 1 tablet (10 mg total) by mouth every 6 (six) hours as needed for nausea or vomiting. 30 tablet 1   Pyridoxine HCl (VITAMIN B6) 100 MG TABS      rosuvastatin  (CRESTOR ) 40 MG tablet Take 1 tablet (40 mg total) by mouth daily. 90 tablet 3   solifenacin  (VESICARE ) 10 MG tablet TAKE 1 TABLET DAILY 90 tablet 3   spironolactone  (ALDACTONE ) 25 MG tablet Take 1 tablet (25 mg total) by mouth daily. 90 tablet 3   TART CHERRY PO Take by mouth.     traMADol  (ULTRAM ) 50 MG tablet Take 1 tablet (50 mg total) by mouth every 6 (six) hours as needed for moderate pain (pain score 4-6) or severe pain (pain score 7-10). 25 tablet 0   No current facility-administered medications for this visit.   Facility-Administered Medications Ordered in Other  Visits  Medication Dose Route Frequency Provider Last Rate Last Admin   0.9 %  sodium chloride  infusion   Intravenous Continuous Lanny Callander, MD 10 mL/hr at 10/05/24 1006 New Bag at 10/05/24 1006   CARBOplatin  (PARAPLATIN ) 400 mg in sodium chloride  0.9 % 250 mL chemo infusion  400 mg Intravenous Once Lanny Callander, MD       DOCEtaxel  (TAXOTERE ) 160 mg in sodium chloride  0.9 % 250 mL chemo infusion  75 mg/m2 (Treatment Plan Recorded) Intravenous Once Lanny Callander, MD       trastuzumab -dttb (ONTRUZANT ) 525 mg in sodium chloride  0.9 % 250 mL chemo infusion  6 mg/kg (Treatment Plan Recorded) Intravenous Once Lanny Callander, MD        PHYSICAL EXAMINATION: ECOG PERFORMANCE STATUS: 1 - Symptomatic but completely ambulatory  There were no vitals filed for this visit. Wt Readings from Last 3 Encounters:  09/14/24 193 lb 11.2 oz (87.9 kg)  08/24/24 197 lb 4.8 oz (89.5 kg)  08/23/24 195 lb (88.5 kg)     GENERAL:alert, no distress and comfortable SKIN: skin color, texture, turgor are normal, no rashes or significant lesions EYES: normal, Conjunctiva are pink and non-injected, sclera clear NECK: supple, thyroid  normal size, non-tender, without nodularity LYMPH:  no palpable lymphadenopathy in the cervical, axillary  LUNGS: clear to auscultation and percussion with normal breathing effort HEART: regular rate & rhythm and no murmurs and no lower extremity edema ABDOMEN:abdomen soft, non-tender and normal bowel sounds Musculoskeletal:no cyanosis of digits and no clubbing  NEURO: alert & oriented x 3 with fluent speech, no focal motor/sensory deficits  Physical Exam MEASUREMENTS: Weight- 187.  LABORATORY DATA:  I have reviewed the data as listed    Latest Ref Rng & Units 10/05/2024    8:33 AM 09/14/2024    8:53 AM 08/24/2024    9:08 AM  CBC  WBC 4.0 - 10.5 K/uL 11.8  14.3  6.0   Hemoglobin 12.0 - 15.0 g/dL 89.2  88.3  85.8   Hematocrit 36.0 - 46.0 % 32.2  34.0  41.1   Platelets 150 - 400 K/uL 435  358   277         Latest Ref Rng & Units 10/05/2024    8:33 AM 09/16/2024    9:14 AM 09/14/2024    8:53 AM  CMP  Glucose 70 - 99 mg/dL 885  888  861   BUN 8 - 23 mg/dL 23  13  16    Creatinine 0.44 -  1.00 mg/dL 9.05  9.34  9.36   Sodium 135 - 145 mmol/L 138  137  137   Potassium 3.5 - 5.1 mmol/L 4.3  4.4  4.2   Chloride 98 - 111 mmol/L 102  102  106   CO2 22 - 32 mmol/L 24  22  25    Calcium  8.9 - 10.3 mg/dL 9.9  9.3  9.7   Total Protein 6.5 - 8.1 g/dL 6.4   6.1   Total Bilirubin 0.0 - 1.2 mg/dL 0.3   0.4   Alkaline Phos 38 - 126 U/L 98   104   AST 15 - 41 U/L 24   21   ALT 0 - 44 U/L 39   37       RADIOGRAPHIC STUDIES: I have personally reviewed the radiological images as listed and agreed with the findings in the report. No results found.    No orders of the defined types were placed in this encounter.  All questions were answered. The patient knows to call the clinic with any problems, questions or concerns. No barriers to learning was detected. The total time spent in the appointment was 40 minutes, including review of chart and various tests results, discussions about plan of care and coordination of care plan     Onita Mattock, MD 10/05/2024

## 2024-10-05 NOTE — Progress Notes (Signed)
 Verbal order w/readback from Dr Lanny for Echocardiogram for antibody therapy.  Order placed and Central Scheduling called to schedule appt with pt.

## 2024-10-07 ENCOUNTER — Ambulatory Visit (HOSPITAL_COMMUNITY)

## 2024-10-07 ENCOUNTER — Inpatient Hospital Stay

## 2024-10-07 VITALS — BP 134/78 | HR 88 | Temp 97.8°F | Resp 17

## 2024-10-07 DIAGNOSIS — Z170 Estrogen receptor positive status [ER+]: Secondary | ICD-10-CM

## 2024-10-07 DIAGNOSIS — Z5111 Encounter for antineoplastic chemotherapy: Secondary | ICD-10-CM | POA: Diagnosis not present

## 2024-10-07 MED ORDER — PEGFILGRASTIM-FPGK 6 MG/0.6ML ~~LOC~~ SOSY
6.0000 mg | PREFILLED_SYRINGE | Freq: Once | SUBCUTANEOUS | Status: AC
  Administered 2024-10-07: 6 mg via SUBCUTANEOUS
  Filled 2024-10-07: qty 0.6

## 2024-10-08 ENCOUNTER — Encounter: Payer: Self-pay | Admitting: Cardiology

## 2024-10-08 ENCOUNTER — Encounter: Payer: Self-pay | Admitting: Endocrinology

## 2024-10-08 ENCOUNTER — Inpatient Hospital Stay

## 2024-10-11 ENCOUNTER — Encounter: Payer: Self-pay | Admitting: Physical Therapy

## 2024-10-11 ENCOUNTER — Ambulatory Visit: Attending: Surgery | Admitting: Physical Therapy

## 2024-10-11 DIAGNOSIS — R293 Abnormal posture: Secondary | ICD-10-CM | POA: Insufficient documentation

## 2024-10-11 DIAGNOSIS — C50412 Malignant neoplasm of upper-outer quadrant of left female breast: Secondary | ICD-10-CM | POA: Insufficient documentation

## 2024-10-11 DIAGNOSIS — Z170 Estrogen receptor positive status [ER+]: Secondary | ICD-10-CM | POA: Diagnosis present

## 2024-10-11 DIAGNOSIS — M6281 Muscle weakness (generalized): Secondary | ICD-10-CM | POA: Diagnosis present

## 2024-10-11 DIAGNOSIS — Z483 Aftercare following surgery for neoplasm: Secondary | ICD-10-CM | POA: Diagnosis present

## 2024-10-11 NOTE — Therapy (Addendum)
 OUTPATIENT PHYSICAL THERAPY BREAST CANCER POST OP FOLLOW UP   Patient Name: [name] MRN: [id number] DOB:1946-04-25, 78 y.o., female Today's Date: 10/11/2024  END OF SESSION:  PT End of Session - 10/11/24 1044     Visit Number 2    Number of Visits 10    Date for Recertification  11/08/24    PT Start Time 1003    PT Stop Time 1100    PT Time Calculation (min) 57 min    Activity Tolerance Patient tolerated treatment well    Behavior During Therapy [facility] for tasks assessed/performed          Past Medical History:  Diagnosis Date   Arthritis    OA   Atrial flutter (HCC)    a. s/p TEE/DCCV 08/16/13 (normal LV function, no LAA thrombus).b. s/p ablation by Dr Waddell 09-23-2013   Coronary artery disease    2009 LAD Promus stent   Dysrhythmia    Aflutter, no issues after ablation 2014   Fibroid    Hyperlipidemia    Hypertension    Insulin  pump in place    Osteopenia    Type 1 diabetes mellitus on insulin  therapy (HCC)    Valvular heart disease    a. Mild MR/TR by TEE 08/2013.   Past Surgical History:  Procedure Laterality Date   ABLATION  09/23/2013   CTI by Dr Waddell   APPENDECTOMY     APPLICATION OF WOUND VAC Left 04/11/2017   Procedure: APPLICATION OF WOUND VAC LEFT KNEE;  Surgeon: Fidel Rogue, MD;  Location: [facility] OR;  Service: Orthopedics;  Laterality: Left;   ATRIAL FLUTTER ABLATION N/A 09/23/2013   Procedure: ATRIAL FLUTTER ABLATION;  Surgeon: Danelle LELON Waddell, MD;  Location: [facility] CATH LAB;  Service: Cardiovascular;  Laterality: N/A;   CARDIOVERSION N/A 08/16/2013   Procedure: CARDIOVERSION;  Surgeon: Aleene JINNY Passe, MD;  Location: [facility] ENDOSCOPY;  Service: Cardiovascular;  Laterality: N/A;  spoke with Tom   CATARACT EXTRACTION Bilateral    COLONOSCOPY     CORONARY ANGIOPLASTY WITH STENT PLACEMENT     EYE SURGERY     Cataract   LASER ABLATION Right 11/20/2023   Right leg laser ablation of the Greater saphenous vein with >20 stab phlebectomies   MYOMECTOMY   1977   ORIF PATELLA Left 04/11/2017   ORIF PATELLA Left 04/11/2017   Procedure: OPEN REDUCTION INTERNAL (ORIF) FIXATION PATELLA LEFT KNEE;  Surgeon: Fidel Rogue, MD;  Location: [facility] OR;  Service: Orthopedics;  Laterality: Left;   PELVIC LAPAROSCOPY     PORTACATH PLACEMENT Right 07/27/2024   Procedure: INSERTION OF TUNNELED CENTRAL VENOUS DEVICE WITH PORT;  Surgeon: Vernetta Berg, MD;  Location: [facility];  Service: General;  Laterality: Right;  PORT-A-CATH INSERTION WITH ULTRASOUND GUIDANCE   SENTINEL NODE BIOPSY Left 07/27/2024   Procedure: LEFT SENTINEL LYMPH NODE BIOPSY;  Surgeon: Vernetta Berg, MD;  Location:  SURGERY CENTER;  Service: General;  Laterality: Left;  SENTINEL NODE BIOPSY   TEE WITHOUT CARDIOVERSION N/A 08/16/2013   Procedure: TRANSESOPHAGEAL ECHOCARDIOGRAM (TEE);  Surgeon: Aleene JINNY Passe, MD;  Location: [facility] ENDOSCOPY;  Service: Cardiovascular;  Laterality: N/A;   TUBAL LIGATION  1988   Patient Active Problem List   Diagnosis Date Noted   Atrial flutter (HCC) 07/05/2024   Malignant neoplasm of upper-outer quadrant of left breast in female, estrogen receptor positive (HCC) 06/28/2024   Acute lateral meniscal tear, right, initial encounter 09/02/2023   Varicose veins of both lower extremities with pain  07/04/2023   Arthritis of carpometacarpal [facility]) joint of both thumbs 01/17/2022   Bilateral hearing loss 01/08/2022   Hypertension 05/13/2019   Urinary incontinence 01/19/2019   Valvular heart disease    Insulin  pump in place    Fibroid    Osteoarthritis    Mixed diabetic hyperlipidemia associated with type 1 diabetes mellitus (HCC) 03/16/2015   Type 1 diabetes mellitus with complication (HCC) 05/12/2013   Chronic insomnia 03/25/2012   Osteopenia    Atherosclerosis of native coronary artery of native heart with angina pectoris 04/05/2011    REFERRING PROVIDER: Dr. Vicenta Poli  REFERRING DIAG: Left breast cancer  THERAPY DIAG:   Muscle weakness (generalized)  Aftercare following surgery for neoplasm  Abnormal posture  Malignant neoplasm of upper-outer quadrant of left breast in female, estrogen receptor positive (HCC)  Rationale for Evaluation and Treatment: Rehabilitation  ONSET DATE: 07/27/2024  SUBJECTIVE:                                                                                                                                                                                           SUBJECTIVE STATEMENT: I am not having any pain. My arm is moving well. I have had 3 chemo treatments and after the 2nd one it was pretty bad. I had to have my chemo reduced. Over the weekend I felt pretty weak. I started to feel like I was coming back a little.   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 She underwent a left lumpectomy and sentinel node biopsy (4 negative nodes) on 07/27/2024. It is triple positive with a Ki67 of 3%.   PATIENT GOALS:  Reassess how my recovery is going related to arm function, pain, and swelling.  PAIN:  Are you having pain? No  PRECAUTIONS: Recent Surgery, left UE Lymphedema risk  RED FLAGS: None   ACTIVITY LEVEL / LEISURE: walked the dogs a couple of block on Wednesday, has not been going to the fitness center lately due to fatigue   OBJECTIVE:   PATIENT SURVEYS:  QUICK DASH:  Quick Dash - 10/11/24 0001     Open a tight or new jar Mild difficulty    Do heavy household chores (wash walls, wash floors) No difficulty    Carry a shopping bag or briefcase No difficulty    Wash your back No difficulty    Use a knife to cut food No difficulty    Recreational activities in which you take some force or impact through your arm, shoulder, or hand (golf, hammering, tennis) No difficulty    During the past  week, to what extent has your arm, shoulder or hand problem interfered with your normal social activities with  family, friends, neighbors, or groups? Not at all    During the past week, to what extent has your arm, shoulder or hand problem limited your work or other regular daily activities Not at all    Arm, shoulder, or hand pain. None    Tingling (pins and needles) in your arm, shoulder, or hand None    Difficulty Sleeping No difficulty    DASH Score 2.27 %           OBSERVATIONS: Healing lumpectomy scar but increased scar tissue palpable  POSTURE:  Increased kyphosis, forward head  LYMPHEDEMA ASSESSMENT:   UPPER EXTREMITY AROM/PROM:   A/PROM RIGHT   eval    Shoulder extension 50  Shoulder flexion 147  Shoulder abduction 151  Shoulder internal rotation 72  Shoulder external rotation 43                          (Blank rows = not tested)   A/PROM LEFT   eval LEFT 10/11/2024  Shoulder extension 51 74  Shoulder flexion 135 162  Shoulder abduction 157 160  Shoulder internal rotation 57 65  Shoulder external rotation 79 78                          (Blank rows = not tested)   CERVICAL AROM: All within normal limits:      Percent limited  Flexion WNL  Extension WFL  Right lateral flexion 50% limited  Left lateral flexion 75% limited  Right rotation 50% limited  Left rotation           50% limited      UPPER EXTREMITY STRENGTH: WFL   LYMPHEDEMA ASSESSMENTS (in cm):    LANDMARK RIGHT   eval RIGHT 10/11/2024  10 cm proximal to olecranon process 27.7 26.1  Olecranon process 26.5 25.5  10 cm proximal to ulnar styloid process 22 20  Just proximal to ulnar styloid process 17.4 16.7  Across hand at thumb web space 18.8 19.5  At base of 2nd digit 6.9 6.7  (Blank rows = not tested)   LANDMARK LEFT   eval LEFT 10/11/2024  10 cm proximal to olecranon process 27.2 25.7  Olecranon process 26.1 25  10  cm proximal to ulnar styloid process 21.9 20.7  Just proximal to ulnar styloid process 17.7 16.9  Across hand at thumb web space 19.4 19.6  At base of 2nd digit 6.8 6.6   (Blank rows = not tested)  LOWER EXTREMITY MMT:  MMT Right eval  Hip flexion 3+  Hip extension 3  Hip abduction 4+  Hip adduction   Hip internal rotation   Hip external rotation   Knee flexion 5  Knee extension 5  Ankle dorsiflexion 5  Ankle plantarflexion   Ankle inversion   Ankle eversion    (Blank rows = not tested)  MMT LEFT eval  Hip flexion 3+  Hip extension 3  Hip abduction 4+  Hip adduction   Hip internal rotation   Hip external rotation   Knee flexion 4  Knee extension 5  Ankle dorsiflexion 4+  Ankle plantarflexion   Ankle inversion   Ankle eversion    (Blank rows = not tested)  Surgery type/Date: 07/27/2024 left lumpectomy and sentinel node biopsy Number of lymph nodes removed: 4 Current/past treatment (chemo, radiation, hormone  therapy): chemotherapy Other symptoms:  Heaviness/tightness No Pain No Pitting edema No Infections No Decreased scar mobility Yes Stemmer sign No  30 SEC SIT TO STAND: 10 reps which is below average for her age  SINGLE LIMB STANCE: R - 5 seconds, L 8 seconds  GAIT: Shortened step length bilaterally, foot flat, unsteady at time but no losses of balance  TANDEM STANCE: Tandem with R foot in from 3 sec, with L foot in front 8 seconds  PATIENT EDUCATION:  Education details: importance of exercise during cancer treatment to decrease fatigue levels and to tolerate treatments better, scar mobilization technique Person educated: Patient Education method: Explanation Education comprehension: verbalized understanding  HOME EXERCISE PROGRAM: Reviewed previously given post op HEP. Practice sit to stands from chair without use of UEs  ASSESSMENT:  CLINICAL IMPRESSION: Pt reports to PT after undergoing a L lumpectomy and SLNB 0/4 on 07/27/24. She is currently undergoing chemotherapy. Her shoulder ROM is better than baseline but pt has been having increased fatigue. She had to have her chemo dosage adjusted secondary to this. She  has decreased hip flexion and extension strength. Her 30 sec sit to stand was below average for her age. She had difficulty with SLS and tandem stance with inability to hold either for longer than 8 sec. She ambulates with shortened step length and appears unsteady at times. She would benefit from skilled PT services to improve bilateral hip flexion strength, improve SLS and tandem stance to improve balance, improve posture and progress pt towards independence with a home exercise program for continued strengthening and stretching.   Pt will benefit from skilled therapeutic intervention to improve on the following deficits: Decreased knowledge of precautions, impaired UE functional use, pain, decreased ROM, postural dysfunction.   PT treatment/interventions: ADL/Self care home management, [phone]- PT Re-evaluation, 97110-Therapeutic exercises, 97530- Therapeutic activity, [id number]- Neuromuscular re-education, 97535- Self Care, [id number]- Manual therapy, [phone]- Gait training, and Patient/Family education   GOALS: Goals reviewed with patient? Yes  GOALS MET AT EVAL:  GOALS Name Target Date Goal status  1 Pt will be able to verbalize understanding of pertinent lymphedema risk reduction practices relevant to her dx specifically related to skin care.  Baseline:  No knowledge Eval Achieved at eval  2 Pt will be able to return demo and/or verbalize understanding of the post op HEP related to regaining shoulder ROM. Baseline:  No knowledge Eval Achieved at eval  3 Pt will be able to verbalize understanding of the importance of viewing the post op After Breast CA Class video for further lymphedema risk reduction education and therapeutic exercise.  Baseline:  No knowledge Eval Achieved at eval   LONG TERM GOALS:  (STG=LTG)  GOALS Name Target Date  Goal status  1 Pt will demonstrate she has regained full shoulder ROM and function post operatively compared to baselines.  Baseline: 08/25/2024 INITIAL  2 Pt will  demonstrate at least 4/5 bilateral hip flexion strength to decrease fall risk. 11/08/24 INITIAL  3 Pt will be able to complete SLS bilaterally for at least 15 sec to decrease fall risk. 11/08/24 INITIAL  4 Pt will be able to stand in tandem stance for 15 sec with no losses of balance to decrease fall risk. 11/08/24 INITIAL  5 Pt will be independent in a home exercise program for continued stretching and strengthening.  11/08/24 INITIAL     PLAN:  PT FREQUENCY/DURATION: 2x/wk for 4 weeks  PLAN FOR NEXT SESSION: NuStep, hip strengthening, balance exercises, air ex beam,  hamstring stretching, posture exercises, give post op handout   [facility]  [address]  [place] [address]  [phone]  After Breast Cancer Class Video It is recommended you view the ABC class video to be educated on lymphedema risk reduction. This video lasts for about 30 minutes. It can be viewed on our website here: [email or web address]  Scar massage You can begin gentle scar massage to you incision sites. Gently place one hand on the incision and move the skin (without sliding on the skin) in various directions. Do this for a few minutes and then you can gently massage either coconut oil or vitamin E cream into the scars.  Compression garment You should continue wearing your compression bra until you feel like you no longer have swelling.  Home exercise Program Continue doing the exercises you were given until you feel like you can do them without feeling any tightness at the end.   Walking Program Studies show that 30 minutes of walking per day (fast enough to elevate your heart rate) can significantly reduce the risk of a cancer recurrence. If you can't walk due to other medical reasons, we encourage you to find another activity you could do (like a stationary bike or water exercise).  Posture After breast cancer  surgery, people frequently sit with rounded shoulders posture because it puts their incisions on slack and feels better. If you sit like this and scar tissue forms in that position, you can become very tight and have pain sitting or standing with good posture. Try to be aware of your posture and sit and stand up tall to heal properly.  Follow up PT: It is recommended you return every 3 months for the first 3 years following surgery to be assessed on the SOZO machine for an L-Dex score. This helps prevent clinically significant lymphedema in 95% of patients. These follow up screens are 10 minute appointments that you are not billed for.  [facility] [place], PT 10/11/2024, 11:16 AM

## 2024-10-11 NOTE — Therapy (Deleted)
 OUTPATIENT PHYSICAL THERAPY BREAST CANCER POST OP FOLLOW UP   Patient Name: [name] MRN: [id number] DOB:[birth year], 78 y.o., female Today's Date: 10/11/2024  END OF SESSION:   Past Medical History:  Diagnosis Date   Arthritis    OA   Atrial flutter (HCC)    a. s/p TEE/DCCV 08/16/13 (normal LV function, no LAA thrombus).b. s/p ablation by Dr Waddell 09-23-2013   Coronary artery disease    2009 LAD Promus stent   Dysrhythmia    Aflutter, no issues after ablation 2014   Fibroid    Hyperlipidemia    Hypertension    Insulin  pump in place    Osteopenia    Type 1 diabetes mellitus on insulin  therapy (HCC)    Valvular heart disease    a. Mild MR/TR by TEE 08/2013.   Past Surgical History:  Procedure Laterality Date   ABLATION  09/23/2013   CTI by Dr Waddell   APPENDECTOMY     APPLICATION OF WOUND VAC Left 04/11/2017   Procedure: APPLICATION OF WOUND VAC LEFT KNEE;  Surgeon: Fidel Rogue, MD;  Location: [facility] OR;  Service: Orthopedics;  Laterality: Left;   ATRIAL FLUTTER ABLATION N/A 09/23/2013   Procedure: ATRIAL FLUTTER ABLATION;  Surgeon: Danelle LELON Waddell, MD;  [facility] CATH LAB;  Service: Cardiovascular;  Laterality: N/A;   CARDIOVERSION N/A 08/16/2013   Procedure: CARDIOVERSION;  Surgeon: Aleene JINNY Passe, MD;  Location: [facility] ENDOSCOPY;  Service: Cardiovascular;  Laterality: N/A;  spoke with Tom   CATARACT EXTRACTION Bilateral    COLONOSCOPY     CORONARY ANGIOPLASTY WITH STENT PLACEMENT     EYE SURGERY     Cataract   LASER ABLATION Right 11/20/2023   Right leg laser ablation of the Greater saphenous vein with >20 stab phlebectomies   MYOMECTOMY  1977   ORIF PATELLA Left 04/11/2017   ORIF PATELLA Left 04/11/2017   Procedure: OPEN REDUCTION INTERNAL (ORIF) FIXATION PATELLA LEFT KNEE;  Surgeon: Fidel Rogue, MD;  Location: [facility] OR;  Service: Orthopedics;  Laterality: Left;   PELVIC LAPAROSCOPY     PORTACATH PLACEMENT Right 07/27/2024   Procedure: INSERTION OF  TUNNELED CENTRAL VENOUS DEVICE WITH PORT;  Surgeon: Vernetta Berg, MD;  Location: [facility];  Service: General;  Laterality: Right;  PORT-A-CATH INSERTION WITH ULTRASOUND GUIDANCE   SENTINEL NODE BIOPSY Left 07/27/2024   Procedure: LEFT SENTINEL LYMPH NODE BIOPSY;  Surgeon: Vernetta Berg, MD;  Location: [facility];  Service: General;  Laterality: Left;  SENTINEL NODE BIOPSY   TEE WITHOUT CARDIOVERSION N/A 08/16/2013   Procedure: TRANSESOPHAGEAL ECHOCARDIOGRAM (TEE);  Surgeon: Aleene JINNY Passe, MD;  Location: [facility] ENDOSCOPY;  Service: Cardiovascular;  Laterality: N/A;   TUBAL LIGATION  1988   Patient Active Problem List   Diagnosis Date Noted   Atrial flutter (HCC) 07/05/2024   Malignant neoplasm of upper-outer quadrant of left breast in female, estrogen receptor positive (HCC) 06/28/2024   Acute lateral meniscal tear, right, initial encounter 09/02/2023   Varicose veins of both lower extremities with pain 07/04/2023   Arthritis of carpometacarpal (CMC) joint of both thumbs 01/17/2022   Bilateral hearing loss 01/08/2022   Hypertension 05/13/2019   Urinary incontinence 01/19/2019   Valvular heart disease    Insulin  pump in place    Fibroid    Osteoarthritis    Mixed diabetic hyperlipidemia associated with type 1 diabetes mellitus (HCC) 03/16/2015   Type 1 diabetes mellitus with complication (HCC) 05/12/2013   Chronic insomnia 03/25/2012   Osteopenia  Atherosclerosis of native coronary artery of native heart with angina pectoris 04/05/2011    REFERRING PROVIDER: Dr. Vicenta Poli  REFERRING DIAG: Left breast cancer  THERAPY DIAG:  Malignant neoplasm of upper-outer quadrant of left breast in female, estrogen receptor positive (HCC)  Abnormal posture  Aftercare following surgery for neoplasm  Rationale for Evaluation and Treatment: Rehabilitation  ONSET DATE: 07/27/2024  SUBJECTIVE:                                                                                                                                                                                            SUBJECTIVE STATEMENT: Patient reports she underwent a left lumpectomy and sentinel node biopsy (4 negative nodes) on 07/27/2024. She is currently undergoing chemotherapy and is having significant cancer related fatigue. She will later undergo radiation and anti-estrogen therapy.  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 She underwent a left lumpectomy and sentinel node biopsy (4 negative nodes) on 07/27/2024. It is triple positive with a Ki67 of 3%.   PATIENT GOALS:  Reassess how my recovery is going related to arm function, pain, and swelling.  PAIN:  Are you having pain? {OPRCPAIN:27236}  PRECAUTIONS: Recent Surgery, left UE Lymphedema risk  RED FLAGS: None   ACTIVITY LEVEL / LEISURE: ***   OBJECTIVE:   PATIENT SURVEYS:  QUICK DASH: ***  OBSERVATIONS: ***  POSTURE:  ***  LYMPHEDEMA ASSESSMENT:   UPPER EXTREMITY AROM/PROM:   A/PROM RIGHT   eval    Shoulder extension 50  Shoulder flexion 147  Shoulder abduction 151  Shoulder internal rotation 72  Shoulder external rotation 43                          (Blank rows = not tested)   A/PROM LEFT   eval LEFT 10/11/2024  Shoulder extension 51   Shoulder flexion 135   Shoulder abduction 157   Shoulder internal rotation 57   Shoulder external rotation 79                           (Blank rows = not tested)   CERVICAL AROM: All within normal limits:      Percent limited  Flexion WNL  Extension WFL  Right lateral flexion 50% limited  Left lateral flexion 75% limited  Right rotation 50% limited  Left rotation           50% limited      UPPER EXTREMITY STRENGTH: WFL   LYMPHEDEMA ASSESSMENTS (in cm):  LANDMARK RIGHT   eval RIGHT 10/11/2024  10 cm proximal to olecranon process 27.7   Olecranon process 26.5   10  cm proximal to ulnar styloid process 22   Just proximal to ulnar styloid process 17.4   Across hand at thumb web space 18.8   At base of 2nd digit 6.9   (Blank rows = not tested)   LANDMARK LEFT   eval LEFT 10/11/2024  10 cm proximal to olecranon process 27.2   Olecranon process 26.1   10 cm proximal to ulnar styloid process 21.9   Just proximal to ulnar styloid process 17.7   Across hand at thumb web space 19.4   At base of 2nd digit 6.8   (Blank rows = not tested)  Surgery type/Date: 07/27/2024 left lumpectomy and sentinel node biopsy Number of lymph nodes removed: 4 Current/past treatment (chemo, radiation, hormone therapy): chemotherapy Other symptoms:  Heaviness/tightness {yes/no:20286} Pain {yes/no:20286} Pitting edema {yes/no:20286} Infections {yes/no:20286} Decreased scar mobility {yes/no:20286} Stemmer sign {yes/no:20286}  PATIENT EDUCATION:  Education details: *** Person educated: {Person educated:25204} Education method: {Education Method:25205} Education comprehension: {Education Comprehension:25206}  HOME EXERCISE PROGRAM: Reviewed previously given post op HEP. ***  ASSESSMENT:  CLINICAL IMPRESSION: ***  Pt will benefit from skilled therapeutic intervention to improve on the following deficits: Decreased knowledge of precautions, impaired UE functional use, pain, decreased ROM, postural dysfunction.   PT treatment/interventions: ADL/Self care home management, {rehab planned interventions:25118::97110-Therapeutic exercises,97530- Therapeutic [phone]- Neuromuscular re-education,97535- Self Rjmz,02859- Manual therapy,Patient/Family education}   GOALS: Goals reviewed with patient? Yes  GOALS MET AT EVAL:  GOALS Name Target Date Goal status  1 Pt will be able to verbalize understanding of pertinent lymphedema risk reduction practices relevant to her dx specifically related to skin care.  Baseline:  No knowledge Eval Achieved at eval  2  Pt will be able to return demo and/or verbalize understanding of the post op HEP related to regaining shoulder ROM. Baseline:  No knowledge Eval Achieved at eval  3 Pt will be able to verbalize understanding of the importance of viewing the post op After Breast CA Class video for further lymphedema risk reduction education and therapeutic exercise.  Baseline:  No knowledge Eval Achieved at eval   LONG TERM GOALS:  (STG=LTG)  GOALS Name Target Date  Goal status  1 Pt will demonstrate she has regained full shoulder ROM and function post operatively compared to baselines.  Baseline: 08/25/2024 INITIAL  2  *** INITIAL  3  *** INITIAL  4  *** INITIAL     PLAN:  PT FREQUENCY/DURATION: ***  PLAN FOR NEXT SESSION: ***   [facility]  [address], [address]  [place] [address]  [phone]  After Breast Cancer Class Video It is recommended you view the ABC class video to be educated on lymphedema risk reduction. This video lasts for about 30 minutes. It can be viewed on our website here: [email or web address]  Scar massage You can begin gentle scar massage to you incision sites. Gently place one hand on the incision and move the skin (without sliding on the skin) in various directions. Do this for a few minutes and then you can gently massage either coconut oil or vitamin E cream into the scars.  Compression garment You should continue wearing your compression bra until you feel like you no longer have swelling.  Home exercise Program Continue doing the exercises you were given until you feel like you can do them without feeling any tightness at the  end.   Walking Program Studies show that 30 minutes of walking per day (fast enough to elevate your heart rate) can significantly reduce the risk of a cancer recurrence. If you can't walk due to other medical reasons, we encourage you to find  another activity you could do (like a stationary bike or water exercise).  Posture After breast cancer surgery, people frequently sit with rounded shoulders posture because it puts their incisions on slack and feels better. If you sit like this and scar tissue forms in that position, you can become very tight and have pain sitting or standing with good posture. Try to be aware of your posture and sit and stand up tall to heal properly.  Follow up PT: It is recommended you return every 3 months for the first 3 years following surgery to be assessed on the SOZO machine for an L-Dex score. This helps prevent clinically significant lymphedema in 95% of patients. These follow up screens are 10 minute appointments that you are not billed for.  Tarence Searcy,MARTI COOPER, PT 10/11/2024, 8:14 AM

## 2024-10-12 ENCOUNTER — Ambulatory Visit: Admitting: Physical Therapy

## 2024-10-12 ENCOUNTER — Encounter: Payer: Self-pay | Admitting: Physical Therapy

## 2024-10-12 DIAGNOSIS — M6281 Muscle weakness (generalized): Secondary | ICD-10-CM | POA: Diagnosis not present

## 2024-10-12 DIAGNOSIS — R293 Abnormal posture: Secondary | ICD-10-CM

## 2024-10-12 DIAGNOSIS — Z483 Aftercare following surgery for neoplasm: Secondary | ICD-10-CM

## 2024-10-12 DIAGNOSIS — Z170 Estrogen receptor positive status [ER+]: Secondary | ICD-10-CM

## 2024-10-12 NOTE — Therapy (Signed)
 OUTPATIENT PHYSICAL THERAPY BREAST CANCER POST OP FOLLOW UP   Patient Name: [name] MRN: [id number] DOB:07-Jul-1946, 78 y.o., female Today's Date: 10/12/2024  END OF SESSION:  PT End of Session - 10/12/24 1003     Visit Number 3    Number of Visits 10    Date for Recertification  11/08/24    PT Start Time 1002    PT Stop Time 1055    PT Time Calculation (min) 53 min    Activity Tolerance Patient tolerated treatment well    Behavior During Therapy [facility] for tasks assessed/performed          Past Medical History:  Diagnosis Date   Arthritis    OA   Atrial flutter (HCC)    a. s/p TEE/DCCV 08/16/13 (normal LV function, no LAA thrombus).b. s/p ablation by Dr Waddell 09-23-2013   Coronary artery disease    2009 LAD Promus stent   Dysrhythmia    Aflutter, no issues after ablation 2014   Fibroid    Hyperlipidemia    Hypertension    Insulin  pump in place    Osteopenia    Type 1 diabetes mellitus on insulin  therapy (HCC)    Valvular heart disease    a. Mild MR/TR by TEE 08/2013.   Past Surgical History:  Procedure Laterality Date   ABLATION  09/23/2013   CTI by Dr Waddell   APPENDECTOMY     APPLICATION OF WOUND VAC Left 04/11/2017   Procedure: APPLICATION OF WOUND VAC LEFT KNEE;  Surgeon: Fidel Rogue, MD;  Location: [facility] OR;  Service: Orthopedics;  Laterality: Left;   ATRIAL FLUTTER ABLATION N/A 09/23/2013   Procedure: ATRIAL FLUTTER ABLATION;  Surgeon: Danelle LELON Waddell, MD;  Location: [facility] CATH LAB;  Service: Cardiovascular;  Laterality: N/A;   CARDIOVERSION N/A 08/16/2013   Procedure: CARDIOVERSION;  Surgeon: Aleene JINNY Passe, MD;  Location: [facility] ENDOSCOPY;  Service: Cardiovascular;  Laterality: N/A;  spoke with Tom   CATARACT EXTRACTION Bilateral    COLONOSCOPY     CORONARY ANGIOPLASTY WITH STENT PLACEMENT     EYE SURGERY     Cataract   LASER ABLATION Right 11/20/2023   Right leg laser ablation of the Greater saphenous vein with >20 stab phlebectomies   MYOMECTOMY   1977   ORIF PATELLA Left 04/11/2017   ORIF PATELLA Left 04/11/2017   Procedure: OPEN REDUCTION INTERNAL (ORIF) FIXATION PATELLA LEFT KNEE;  Surgeon: Fidel Rogue, MD;  Location: [facility] OR;  Service: Orthopedics;  Laterality: Left;   PELVIC LAPAROSCOPY     PORTACATH PLACEMENT Right 07/27/2024   Procedure: INSERTION OF TUNNELED CENTRAL VENOUS DEVICE WITH PORT;  Surgeon: Vernetta Berg, MD;  Location: [facility];  Service: General;  Laterality: Right;  PORT-A-CATH INSERTION WITH ULTRASOUND GUIDANCE   SENTINEL NODE BIOPSY Left 07/27/2024   Procedure: LEFT SENTINEL LYMPH NODE BIOPSY;  Surgeon: Vernetta Berg, MD;  Location: [facility];  Service: General;  Laterality: Left;  SENTINEL NODE BIOPSY   TEE WITHOUT CARDIOVERSION N/A 08/16/2013   Procedure: TRANSESOPHAGEAL ECHOCARDIOGRAM (TEE);  Surgeon: Aleene JINNY Passe, MD;  Location: [facility] ENDOSCOPY;  Service: Cardiovascular;  Laterality: N/A;   TUBAL LIGATION  1988   Patient Active Problem List   Diagnosis Date Noted   Atrial flutter (HCC) 07/05/2024   Malignant neoplasm of upper-outer quadrant of left breast in female, estrogen receptor positive (HCC) 06/28/2024   Acute lateral meniscal tear, right, initial encounter 09/02/2023   Varicose veins of both lower extremities with pain  07/04/2023   Arthritis of carpometacarpal [facility]) joint of both thumbs 01/17/2022   Bilateral hearing loss 01/08/2022   Hypertension 05/13/2019   Urinary incontinence 01/19/2019   Valvular heart disease    Insulin  pump in place    Fibroid    Osteoarthritis    Mixed diabetic hyperlipidemia associated with type 1 diabetes mellitus (HCC) 03/16/2015   Type 1 diabetes mellitus with complication (HCC) 05/12/2013   Chronic insomnia 03/25/2012   Osteopenia    Atherosclerosis of native coronary artery of native heart with angina pectoris 04/05/2011    REFERRING PROVIDER: Dr. Vicenta Poli  REFERRING DIAG: Left breast cancer  THERAPY DIAG:   Muscle weakness (generalized)  Aftercare following surgery for neoplasm  Abnormal posture  Malignant neoplasm of upper-outer quadrant of left breast in female, estrogen receptor positive (HCC)  Rationale for Evaluation and Treatment: Rehabilitation  ONSET DATE: 07/27/2024  SUBJECTIVE:                                                                                                                                                                                           SUBJECTIVE STATEMENT: I am feeling better than yesterday.  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 She underwent a left lumpectomy and sentinel node biopsy (4 negative nodes) on 07/27/2024. It is triple positive with a Ki67 of 3%.   PATIENT GOALS:  Reassess how my recovery is going related to arm function, pain, and swelling.  PAIN:  Are you having pain? No  PRECAUTIONS: Recent Surgery, left UE Lymphedema risk  RED FLAGS: None   ACTIVITY LEVEL / LEISURE: walked the dogs a couple of block on Wednesday, has not been going to the fitness center lately due to fatigue   OBJECTIVE:   PATIENT SURVEYS:  QUICK DASH:     OBSERVATIONS: Healing lumpectomy scar but increased scar tissue palpable  POSTURE:  Increased kyphosis, forward head  LYMPHEDEMA ASSESSMENT:   UPPER EXTREMITY AROM/PROM:   A/PROM RIGHT   eval    Shoulder extension 50  Shoulder flexion 147  Shoulder abduction 151  Shoulder internal rotation 72  Shoulder external rotation 43                          (Blank rows = not tested)   A/PROM LEFT   eval LEFT 10/11/2024  Shoulder extension 51 74  Shoulder flexion 135 162  Shoulder abduction 157 160  Shoulder internal rotation 57 65  Shoulder external rotation 79 78                          (  Blank rows = not tested)   CERVICAL AROM: All within normal limits:      Percent limited  Flexion WNL  Extension WFL   Right lateral flexion 50% limited  Left lateral flexion 75% limited  Right rotation 50% limited  Left rotation           50% limited      UPPER EXTREMITY STRENGTH: WFL   LYMPHEDEMA ASSESSMENTS (in cm):    LANDMARK RIGHT   eval RIGHT 10/11/2024  10 cm proximal to olecranon process 27.7 26.1  Olecranon process 26.5 25.5  10 cm proximal to ulnar styloid process 22 20  Just proximal to ulnar styloid process 17.4 16.7  Across hand at thumb web space 18.8 19.5  At base of 2nd digit 6.9 6.7  (Blank rows = not tested)   LANDMARK LEFT   eval LEFT 10/11/2024  10 cm proximal to olecranon process 27.2 25.7  Olecranon process 26.1 25  10  cm proximal to ulnar styloid process 21.9 20.7  Just proximal to ulnar styloid process 17.7 16.9  Across hand at thumb web space 19.4 19.6  At base of 2nd digit 6.8 6.6  (Blank rows = not tested)  LOWER EXTREMITY MMT:  MMT Right eval  Hip flexion 3+  Hip extension 3  Hip abduction 4+  Hip adduction   Hip internal rotation   Hip external rotation   Knee flexion 5  Knee extension 5  Ankle dorsiflexion 5  Ankle plantarflexion   Ankle inversion   Ankle eversion    (Blank rows = not tested)  MMT LEFT eval  Hip flexion 3+  Hip extension 3  Hip abduction 4+  Hip adduction   Hip internal rotation   Hip external rotation   Knee flexion 4  Knee extension 5  Ankle dorsiflexion 4+  Ankle plantarflexion   Ankle inversion   Ankle eversion    (Blank rows = not tested)  Surgery type/Date: 07/27/2024 left lumpectomy and sentinel node biopsy Number of lymph nodes removed: 4 Current/past treatment (chemo, radiation, hormone therapy): chemotherapy Other symptoms:  Heaviness/tightness No Pain No Pitting edema No Infections No Decreased scar mobility Yes Stemmer sign No  30 SEC SIT TO STAND: 10 reps which is below average for her age  SINGLE LIMB STANCE: R - 5 seconds, L 8 seconds  GAIT: Shortened step length bilaterally, foot flat,  unsteady at time but no losses of balance  TANDEM STANCE: Tandem with R foot in from 3 sec, with L foot in front 8 seconds  TREATMENT PERFORMED: 10/12/24:  Therapeutic Exercise: Seated hamstring stretch x 30 sec holds x 2 reps bilaterally with v/c to keep from rounding back TKE with 2lb ankle weights with 3 sec holds x 10 reps bilaterally  Seated hip flexion with 2 lb ankle weights x 10 with v/c to not lean back NuStep level 3, seat at 10, arms at 9 (using arms 2 min on/2 min off due to hx of SLNB) x 10 min Therapeutic Activity: Bridging x 10 reps with v/c and t/c to use the core and engage core throughout to avoid hamstring cramping Pelvic tilts x 10 reps with 5 sec holds with v/c and t/c for proper form Scapular retraction with v/c to pull back and down x 10 reps with red band Created HEP with the above and educated pt on HEP   PATIENT EDUCATION:  Education details: importance of exercise during cancer treatment to decrease fatigue levels and to tolerate treatments better, scar mobilization  technique Person educated: Patient Education method: Explanation Education comprehension: verbalized understanding  HOME EXERCISE PROGRAM: Reviewed previously given post op HEP. Practice sit to stands from chair without use of UEs  Access Code: [email or web address] URL: [email or web address] Date: 10/12/2024 Prepared by: Florina Lanis Carbon  Exercises - Seated Hamstring Stretch  - 1 x daily - 7 x weekly - 1 sets - 2 reps - 30 sec hold - Seated Hip Flexion March with Ankle Weights  - 1 x daily - 7 x weekly - 1 sets - 10 reps - Seated Long Arc Quad with Ankle Weight  - 1 x daily - 7 x weekly - 1 sets - 10 reps - 3 sec hold - Supine Bridge  - 1 x daily - 7 x weekly - 1 sets - 10 reps - Supine Posterior Pelvic Tilt  - 1 x daily - 7 x weekly - 1-3 sets - 10 reps - 5 sec hold - Scapular Retraction with Resistance  - 1 x daily - 7 x weekly - 1 sets - 10 reps   ASSESSMENT:  CLINICAL  IMPRESSION: Began LE strengthening exercises today with ankle weights which pt found challenging. Educated pt to not use more than 2 lb ankle weights at home. Educated pt in core strengthening exercises today and provided v/c and t/c for form. Added all of the new exercises to her HEP and educated her in this. She requires frequent cueing for upright posture throughout due to increased kyphosis. Will assess how pt tolerate today's session at next session and hope to progress to standing exercises and balance.   Pt will benefit from skilled therapeutic intervention to improve on the following deficits: Decreased knowledge of precautions, impaired UE functional use, pain, decreased ROM, postural dysfunction.   PT treatment/interventions: ADL/Self care home management, [phone]- PT Re-evaluation, 97110-Therapeutic exercises, 97530- Therapeutic activity, [id number]- Neuromuscular re-education, 97535- Self Care, [id number]- Manual therapy, [phone]- Gait training, and Patient/Family education   GOALS: Goals reviewed with patient? Yes  GOALS MET AT EVAL:  GOALS Name Target Date Goal status  1 Pt will be able to verbalize understanding of pertinent lymphedema risk reduction practices relevant to her dx specifically related to skin care.  Baseline:  No knowledge Eval Achieved at eval  2 Pt will be able to return demo and/or verbalize understanding of the post op HEP related to regaining shoulder ROM. Baseline:  No knowledge Eval Achieved at eval  3 Pt will be able to verbalize understanding of the importance of viewing the post op After Breast CA Class video for further lymphedema risk reduction education and therapeutic exercise.  Baseline:  No knowledge Eval Achieved at eval   LONG TERM GOALS:  (STG=LTG)  GOALS Name Target Date  Goal status  1 Pt will demonstrate she has regained full shoulder ROM and function post operatively compared to baselines.  Baseline: 08/25/2024 INITIAL  2 Pt will demonstrate at least  4/5 bilateral hip flexion strength to decrease fall risk. 11/08/24 INITIAL  3 Pt will be able to complete SLS bilaterally for at least 15 sec to decrease fall risk. 11/08/24 INITIAL  4 Pt will be able to stand in tandem stance for 15 sec with no losses of balance to decrease fall risk. 11/08/24 INITIAL  5 Pt will be independent in a home exercise program for continued stretching and strengthening.  11/08/24 INITIAL     PLAN:  PT FREQUENCY/DURATION: 2x/wk for 4 weeks  PLAN FOR NEXT SESSION: how long did soreness last?, NuStep, hip strengthening,  balance exercises, air ex beam, hamstring stretching, posture exercises, give post op handout   [facility]  [address], [address]  [place] [address]  [phone]  After Breast Cancer Class Video It is recommended you view the ABC class video to be educated on lymphedema risk reduction. This video lasts for about 30 minutes. It can be viewed on our website here: [email or web address]  Scar massage You can begin gentle scar massage to you incision sites. Gently place one hand on the incision and move the skin (without sliding on the skin) in various directions. Do this for a few minutes and then you can gently massage either coconut oil or vitamin E cream into the scars.  Compression garment You should continue wearing your compression bra until you feel like you no longer have swelling.  Home exercise Program Continue doing the exercises you were given until you feel like you can do them without feeling any tightness at the end.   Walking Program Studies show that 30 minutes of walking per day (fast enough to elevate your heart rate) can significantly reduce the risk of a cancer recurrence. If you can't walk due to other medical reasons, we encourage you to find another activity you could do (like a stationary bike or water exercise).  Posture After breast  cancer surgery, people frequently sit with rounded shoulders posture because it puts their incisions on slack and feels better. If you sit like this and scar tissue forms in that position, you can become very tight and have pain sitting or standing with good posture. Try to be aware of your posture and sit and stand up tall to heal properly.  Follow up PT: It is recommended you return every 3 months for the first 3 years following surgery to be assessed on the SOZO machine for an L-Dex score. This helps prevent clinically significant lymphedema in 95% of patients. These follow up screens are 10 minute appointments that you are not billed for.  [facility], PT 10/12/2024, 10:56 AM

## 2024-10-15 ENCOUNTER — Ambulatory Visit (HOSPITAL_COMMUNITY)
Admission: RE | Admit: 2024-10-15 | Discharge: 2024-10-15 | Disposition: A | Source: Ambulatory Visit | Attending: Hematology

## 2024-10-15 DIAGNOSIS — Z170 Estrogen receptor positive status [ER+]: Secondary | ICD-10-CM | POA: Diagnosis not present

## 2024-10-15 DIAGNOSIS — R9431 Abnormal electrocardiogram [ECG] [EKG]: Secondary | ICD-10-CM | POA: Diagnosis not present

## 2024-10-15 DIAGNOSIS — C50412 Malignant neoplasm of upper-outer quadrant of left female breast: Secondary | ICD-10-CM

## 2024-10-15 DIAGNOSIS — I340 Nonrheumatic mitral (valve) insufficiency: Secondary | ICD-10-CM | POA: Diagnosis not present

## 2024-10-15 DIAGNOSIS — Z7969 Long term (current) use of other immunomodulators and immunosuppressants: Secondary | ICD-10-CM | POA: Diagnosis not present

## 2024-10-15 LAB — ECHOCARDIOGRAM COMPLETE
AR max vel: 2.43 cm2
AV Area VTI: 2.43 cm2
AV Area mean vel: 2.36 cm2
AV Mean grad: 5 mmHg
AV Peak grad: 8.6 mmHg
Ao pk vel: 1.47 m/s
Area-P 1/2: 3.83 cm2
Calc EF: 59.6 %
S' Lateral: 3 cm
Single Plane A2C EF: 60.9 %
Single Plane A4C EF: 55.4 %

## 2024-10-19 ENCOUNTER — Encounter: Payer: Self-pay | Admitting: Physical Therapy

## 2024-10-19 ENCOUNTER — Ambulatory Visit: Admitting: Physical Therapy

## 2024-10-19 DIAGNOSIS — C50412 Malignant neoplasm of upper-outer quadrant of left female breast: Secondary | ICD-10-CM

## 2024-10-19 DIAGNOSIS — M6281 Muscle weakness (generalized): Secondary | ICD-10-CM

## 2024-10-19 DIAGNOSIS — Z483 Aftercare following surgery for neoplasm: Secondary | ICD-10-CM

## 2024-10-19 DIAGNOSIS — R293 Abnormal posture: Secondary | ICD-10-CM

## 2024-10-19 NOTE — Therapy (Signed)
 OUTPATIENT PHYSICAL THERAPY BREAST CANCER POST OP FOLLOW UP   Patient Name: [name] MRN: [id number] DOB:1946/11/02, 78 y.o., female Today's Date: 10/19/2024  END OF SESSION:  PT End of Session - 10/19/24 1005     Visit Number 4    Number of Visits 10    Date for Recertification  11/08/24    PT Start Time 1003    PT Stop Time 1050    PT Time Calculation (min) 47 min    Behavior During Therapy [facility] for tasks assessed/performed           Past Medical History:  Diagnosis Date   Arthritis    OA   Atrial flutter (HCC)    a. s/p TEE/DCCV 08/16/13 (normal LV function, no LAA thrombus).b. s/p ablation by Dr Waddell 09-23-2013   Coronary artery disease    2009 LAD Promus stent   Dysrhythmia    Aflutter, no issues after ablation 2014   Fibroid    Hyperlipidemia    Hypertension    Insulin  pump in place    Osteopenia    Type 1 diabetes mellitus on insulin  therapy (HCC)    Valvular heart disease    a. Mild MR/TR by TEE 08/2013.   Past Surgical History:  Procedure Laterality Date   ABLATION  09/23/2013   CTI by Dr Waddell   APPENDECTOMY     APPLICATION OF WOUND VAC Left 04/11/2017   Procedure: APPLICATION OF WOUND VAC LEFT KNEE;  Surgeon: Fidel Rogue, MD;  Location: [facility] OR;  Service: Orthopedics;  Laterality: Left;   ATRIAL FLUTTER ABLATION N/A 09/23/2013   Procedure: ATRIAL FLUTTER ABLATION;  Surgeon: Danelle LELON Waddell, MD;  Location: [facility] CATH LAB;  Service: Cardiovascular;  Laterality: N/A;   CARDIOVERSION N/A 08/16/2013   Procedure: CARDIOVERSION;  Surgeon: Aleene JINNY Passe, MD;  Location: [facility] ENDOSCOPY;  Service: Cardiovascular;  Laterality: N/A;  spoke with Tom   CATARACT EXTRACTION Bilateral    COLONOSCOPY     CORONARY ANGIOPLASTY WITH STENT PLACEMENT     EYE SURGERY     Cataract   LASER ABLATION Right 11/20/2023   Right leg laser ablation of the Greater saphenous vein with >20 stab phlebectomies   MYOMECTOMY  1977   ORIF PATELLA Left 04/11/2017   ORIF PATELLA  Left 04/11/2017   Procedure: OPEN REDUCTION INTERNAL (ORIF) FIXATION PATELLA LEFT KNEE;  Surgeon: Fidel Rogue, MD;  Location: [facility] OR;  Service: Orthopedics;  Laterality: Left;   PELVIC LAPAROSCOPY     PORTACATH PLACEMENT Right 07/27/2024   Procedure: INSERTION OF TUNNELED CENTRAL VENOUS DEVICE WITH PORT;  Surgeon: Vernetta Berg, MD;  Location: [facility];  Service: General;  Laterality: Right;  PORT-A-CATH INSERTION WITH ULTRASOUND GUIDANCE   SENTINEL NODE BIOPSY Left 07/27/2024   Procedure: LEFT SENTINEL LYMPH NODE BIOPSY;  Surgeon: Vernetta Berg, MD;  Location: [facility];  Service: General;  Laterality: Left;  SENTINEL NODE BIOPSY   TEE WITHOUT CARDIOVERSION N/A 08/16/2013   Procedure: TRANSESOPHAGEAL ECHOCARDIOGRAM (TEE);  Surgeon: Aleene JINNY Passe, MD;  Location: [facility] ENDOSCOPY;  Service: Cardiovascular;  Laterality: N/A;   TUBAL LIGATION  1988   Patient Active Problem List   Diagnosis Date Noted   Atrial flutter (HCC) 07/05/2024   Malignant neoplasm of upper-outer quadrant of left breast in female, estrogen receptor positive (HCC) 06/28/2024   Acute lateral meniscal tear, right, initial encounter 09/02/2023   Varicose veins of both lower extremities with pain 07/04/2023   Arthritis of carpometacarpal (CMC) joint  of both thumbs 01/17/2022   Bilateral hearing loss 01/08/2022   Hypertension 05/13/2019   Urinary incontinence 01/19/2019   Valvular heart disease    Insulin  pump in place    Fibroid    Osteoarthritis    Mixed diabetic hyperlipidemia associated with type 1 diabetes mellitus (HCC) 03/16/2015   Type 1 diabetes mellitus with complication (HCC) 05/12/2013   Chronic insomnia 03/25/2012   Osteopenia    Atherosclerosis of native coronary artery of native heart with angina pectoris 04/05/2011    REFERRING PROVIDER: Dr. Vicenta Poli  REFERRING DIAG: Left breast cancer  THERAPY DIAG:  Muscle weakness (generalized)  Aftercare following  surgery for neoplasm  Abnormal posture  Malignant neoplasm of upper-outer quadrant of left breast in female, estrogen receptor positive (HCC)  Rationale for Evaluation and Treatment: Rehabilitation  ONSET DATE: 07/27/2024  SUBJECTIVE:                                                                                                                                                                                           SUBJECTIVE STATEMENT: The red band stretching pulled something. I still feel it. I have trouble reaching back to my bra. I had soreness the next day. I was feeling good Sat, Sun and yesterday. I went full steam ahead. I delivered gifts. I went up and down to the attic multiple times. I cooked my son birthday dinner. I was carrying stuff so I probably aggravated my shoulder.   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 She underwent a left lumpectomy and sentinel node biopsy (4 negative nodes) on 07/27/2024. It is triple positive with a Ki67 of 3%.   PATIENT GOALS:  Reassess how my recovery is going related to arm function, pain, and swelling.  PAIN:  Are you having pain? Yes: NPRS scale: 7/10 Pain location: R shoulder Pain description: sore, hurts to touch, feels like it has a catch Aggravating factors: getting dressed, moving it Relieving factors: advil  PRECAUTIONS: Recent Surgery, left UE Lymphedema risk  RED FLAGS: None   ACTIVITY LEVEL / LEISURE: walked the dogs a couple of block on Wednesday, has not been going to the fitness center lately due to fatigue   OBJECTIVE:   PATIENT SURVEYS:  QUICK DASH:     OBSERVATIONS: Healing lumpectomy scar but increased scar tissue palpable  POSTURE:  Increased kyphosis, forward head  LYMPHEDEMA ASSESSMENT:   UPPER EXTREMITY AROM/PROM:   A/PROM RIGHT   eval    Shoulder extension 50  Shoulder flexion 147  Shoulder abduction 151  Shoulder  internal rotation 72  Shoulder external rotation 43                          (Blank rows = not tested)   A/PROM LEFT   eval LEFT 10/11/2024  Shoulder extension 51 74  Shoulder flexion 135 162  Shoulder abduction 157 160  Shoulder internal rotation 57 65  Shoulder external rotation 79 78                          (Blank rows = not tested)   CERVICAL AROM: All within normal limits:      Percent limited  Flexion WNL  Extension WFL  Right lateral flexion 50% limited  Left lateral flexion 75% limited  Right rotation 50% limited  Left rotation           50% limited      UPPER EXTREMITY STRENGTH: WFL   LYMPHEDEMA ASSESSMENTS (in cm):    LANDMARK RIGHT   eval RIGHT 10/11/2024  10 cm proximal to olecranon process 27.7 26.1  Olecranon process 26.5 25.5  10 cm proximal to ulnar styloid process 22 20  Just proximal to ulnar styloid process 17.4 16.7  Across hand at thumb web space 18.8 19.5  At base of 2nd digit 6.9 6.7  (Blank rows = not tested)   LANDMARK LEFT   eval LEFT 10/11/2024  10 cm proximal to olecranon process 27.2 25.7  Olecranon process 26.1 25  10  cm proximal to ulnar styloid process 21.9 20.7  Just proximal to ulnar styloid process 17.7 16.9  Across hand at thumb web space 19.4 19.6  At base of 2nd digit 6.8 6.6  (Blank rows = not tested)  LOWER EXTREMITY MMT:  MMT Right eval  Hip flexion 3+  Hip extension 3  Hip abduction 4+  Hip adduction   Hip internal rotation   Hip external rotation   Knee flexion 5  Knee extension 5  Ankle dorsiflexion 5  Ankle plantarflexion   Ankle inversion   Ankle eversion    (Blank rows = not tested)  MMT LEFT eval  Hip flexion 3+  Hip extension 3  Hip abduction 4+  Hip adduction   Hip internal rotation   Hip external rotation   Knee flexion 4  Knee extension 5  Ankle dorsiflexion 4+  Ankle plantarflexion   Ankle inversion   Ankle eversion    (Blank rows = not tested)  Surgery type/Date: 07/27/2024 left  lumpectomy and sentinel node biopsy Number of lymph nodes removed: 4 Current/past treatment (chemo, radiation, hormone therapy): chemotherapy Other symptoms:  Heaviness/tightness No Pain No Pitting edema No Infections No Decreased scar mobility Yes Stemmer sign No  30 SEC SIT TO STAND: 10 reps which is below average for her age  SINGLE LIMB STANCE: R - 5 seconds, L 8 seconds  GAIT: Shortened step length bilaterally, foot flat, unsteady at time but no losses of balance  TANDEM STANCE: Tandem with R foot in from 3 sec, with L foot in front 8 seconds  TREATMENT PERFORMED: 10/19/24:  Therapeutic Exercise: Seated hamstring stretch x 30 sec holds x 2 reps bilaterally with v/c to keep from rounding back TKE with 2lb ankle weights with 3 sec holds x 10 reps bilaterally  Seated hip flexion with 2 lb ankle weights x 10 with v/c to not lean back NuStep level 4, seat at 10, arms at 9 (no arms half way through due to hx  of SLNB) x 10 min - steps 870 Therapeutic Activity: Bridging x 10 reps with 5 sec holds with pt demonstrating good form today and no hamstring cramping Pelvic tilts x 10 reps with 5 sec holds with pt demonstrating good form Scapular retraction with v/c to pull back and down x 10 reps with yellow band and cues for upright posture and keeping elbows close Neuro reed: Standing on air ex with 1 HHA: hip flexion x 10 reps bilaterally then hip extension with v/c to keep knee straight with pt having difficulty clearing mat due to inability to hip hike  10/12/24:  Therapeutic Exercise: Seated hamstring stretch x 30 sec holds x 2 reps bilaterally with v/c to keep from rounding back TKE with 2lb ankle weights with 3 sec holds x 10 reps bilaterally  Seated hip flexion with 2 lb ankle weights x 10 with v/c to not lean back NuStep level 3, seat at 10, arms at 9 (using arms 2 min on/2 min off due to hx of SLNB) x 10 min Therapeutic Activity: Bridging x 10 reps with v/c and t/c to use the  core and engage core throughout to avoid hamstring cramping Pelvic tilts x 10 reps with 5 sec holds with v/c and t/c for proper form Scapular retraction with v/c to pull back and down x 10 reps with red band   PATIENT EDUCATION:  Education details: why pec muscle is sore with new exericses Person educated: Patient Education method: Explanation Education comprehension: verbalized understanding  HOME EXERCISE PROGRAM: Reviewed previously given post op HEP. Practice sit to stands from chair without use of UEs  Access Code: [email or web address] URL: [email or web address] Date: 10/12/2024 Prepared by: Florina Lanis Carbon  Exercises - Seated Hamstring Stretch  - 1 x daily - 7 x weekly - 1 sets - 2 reps - 30 sec hold - Seated Hip Flexion March with Ankle Weights  - 1 x daily - 7 x weekly - 1 sets - 10 reps - Seated Long Arc Quad with Ankle Weight  - 1 x daily - 7 x weekly - 1 sets - 10 reps - 3 sec hold - Supine Bridge  - 1 x daily - 7 x weekly - 1 sets - 10 reps - Supine Posterior Pelvic Tilt  - 1 x daily - 7 x weekly - 1-3 sets - 10 reps - 5 sec hold - Scapular Retraction with Resistance  - 1 x daily - 7 x weekly - 1 sets - 10 reps   ASSESSMENT:  CLINICAL IMPRESSION: Pt reports shoulder pain today but after discussing with patient and palpating it is actually her pec that is sore. Discussed with patient that this is due to posture exercises since this is stretching her pec muscle. Pt demonstrating improved form with bridging and pelvic tilts today. Added neuro re education exercises which pt found very challenging so will continue to add more of these to challenge her balance.   Pt will benefit from skilled therapeutic intervention to improve on the following deficits: Decreased knowledge of precautions, impaired UE functional use, pain, decreased ROM, postural dysfunction.   PT treatment/interventions: ADL/Self care home management, [phone]- PT Re-evaluation, 97110-Therapeutic  exercises, 97530- Therapeutic activity, [id number]- Neuromuscular re-education, 97535- Self Care, [id number]- Manual therapy, [phone]- Gait training, and Patient/Family education   GOALS: Goals reviewed with patient? Yes  GOALS MET AT EVAL:  GOALS Name Target Date Goal status  1 Pt will be able to verbalize understanding of pertinent lymphedema risk reduction practices relevant to  her dx specifically related to skin care.  Baseline:  No knowledge Eval Achieved at eval  2 Pt will be able to return demo and/or verbalize understanding of the post op HEP related to regaining shoulder ROM. Baseline:  No knowledge Eval Achieved at eval  3 Pt will be able to verbalize understanding of the importance of viewing the post op After Breast CA Class video for further lymphedema risk reduction education and therapeutic exercise.  Baseline:  No knowledge Eval Achieved at eval   LONG TERM GOALS:  (STG=LTG)  GOALS Name Target Date  Goal status  1 Pt will demonstrate she has regained full shoulder ROM and function post operatively compared to baselines.  Baseline: 08/25/2024 INITIAL  2 Pt will demonstrate at least 4/5 bilateral hip flexion strength to decrease fall risk. 11/08/24 INITIAL  3 Pt will be able to complete SLS bilaterally for at least 15 sec to decrease fall risk. 11/08/24 INITIAL  4 Pt will be able to stand in tandem stance for 15 sec with no losses of balance to decrease fall risk. 11/08/24 INITIAL  5 Pt will be independent in a home exercise program for continued stretching and strengthening.  11/08/24 INITIAL     PLAN:  PT FREQUENCY/DURATION: 2x/wk for 4 weeks  PLAN FOR NEXT SESSION: how long did soreness last?, NuStep, hip strengthening, balance exercises, air ex beam, hamstring stretching, posture exercises, give post op handout   [facility]  [address], [address]  [place] [address]  [phone]  After Breast Cancer Class Video It is recommended you view the [facility]  class video to be educated on lymphedema risk reduction. This video lasts for about 30 minutes. It can be viewed on our website here: [email or web address]  Scar massage You can begin gentle scar massage to you incision sites. Gently place one hand on the incision and move the skin (without sliding on the skin) in various directions. Do this for a few minutes and then you can gently massage either coconut oil or vitamin E cream into the scars.  Compression garment You should continue wearing your compression bra until you feel like you no longer have swelling.  Home exercise Program Continue doing the exercises you were given until you feel like you can do them without feeling any tightness at the end.   Walking Program Studies show that 30 minutes of walking per day (fast enough to elevate your heart rate) can significantly reduce the risk of a cancer recurrence. If you can't walk due to other medical reasons, we encourage you to find another activity you could do (like a stationary bike or water exercise).  Posture After breast cancer surgery, people frequently sit with rounded shoulders posture because it puts their incisions on slack and feels better. If you sit like this and scar tissue forms in that position, you can become very tight and have pain sitting or standing with good posture. Try to be aware of your posture and sit and stand up tall to heal properly.  Follow up PT: It is recommended you return every 3 months for the first 3 years following surgery to be assessed on the SOZO machine for an L-Dex score. This helps prevent clinically significant lymphedema in 95% of patients. These follow up screens are 10 minute appointments that you are not billed for.  [facility], PT 10/19/2024, 10:57 AM

## 2024-10-21 ENCOUNTER — Encounter: Payer: Self-pay | Admitting: Physical Therapy

## 2024-10-21 ENCOUNTER — Ambulatory Visit: Admitting: Physical Therapy

## 2024-10-21 DIAGNOSIS — Z483 Aftercare following surgery for neoplasm: Secondary | ICD-10-CM

## 2024-10-21 DIAGNOSIS — R293 Abnormal posture: Secondary | ICD-10-CM

## 2024-10-21 DIAGNOSIS — M6281 Muscle weakness (generalized): Secondary | ICD-10-CM | POA: Diagnosis not present

## 2024-10-21 DIAGNOSIS — C50412 Malignant neoplasm of upper-outer quadrant of left female breast: Secondary | ICD-10-CM

## 2024-10-21 NOTE — Therapy (Signed)
 OUTPATIENT PHYSICAL THERAPY BREAST CANCER POST OP FOLLOW UP   Patient Name: [name] MRN: [id number] DOB:05/25/1946, 78 y.o., female Today's Date: 10/21/2024  END OF SESSION:  PT End of Session - 10/21/24 1007     Visit Number 5    Number of Visits 10    Date for Recertification  11/08/24    PT Start Time 1006    PT Stop Time 1055    PT Time Calculation (min) 49 min    Activity Tolerance Patient tolerated treatment well    Behavior During Therapy [facility] for tasks assessed/performed           Past Medical History:  Diagnosis Date   Arthritis    OA   Atrial flutter (HCC)    a. s/p TEE/DCCV 08/16/13 (normal LV function, no LAA thrombus).b. s/p ablation by Dr Waddell 09-23-2013   Coronary artery disease    2009 LAD Promus stent   Dysrhythmia    Aflutter, no issues after ablation 2014   Fibroid    Hyperlipidemia    Hypertension    Insulin  pump in place    Osteopenia    Type 1 diabetes mellitus on insulin  therapy (HCC)    Valvular heart disease    a. Mild MR/TR by TEE 08/2013.   Past Surgical History:  Procedure Laterality Date   ABLATION  09/23/2013   CTI by Dr Waddell   APPENDECTOMY     APPLICATION OF WOUND VAC Left 04/11/2017   Procedure: APPLICATION OF WOUND VAC LEFT KNEE;  Surgeon: Fidel Rogue, MD;  Location: [facility] OR;  Service: Orthopedics;  Laterality: Left;   ATRIAL FLUTTER ABLATION N/A 09/23/2013   Procedure: ATRIAL FLUTTER ABLATION;  Surgeon: Danelle LELON Waddell, MD;  Location: [facility] CATH LAB;  Service: Cardiovascular;  Laterality: N/A;   CARDIOVERSION N/A 08/16/2013   Procedure: CARDIOVERSION;  Surgeon: Aleene JINNY Passe, MD;  Location: [facility] ENDOSCOPY;  Service: Cardiovascular;  Laterality: N/A;  spoke with Tom   CATARACT EXTRACTION Bilateral    COLONOSCOPY     CORONARY ANGIOPLASTY WITH STENT PLACEMENT     EYE SURGERY     Cataract   LASER ABLATION Right 11/20/2023   Right leg laser ablation of the Greater saphenous vein with >20 stab phlebectomies   MYOMECTOMY   1977   ORIF PATELLA Left 04/11/2017   ORIF PATELLA Left 04/11/2017   Procedure: OPEN REDUCTION INTERNAL (ORIF) FIXATION PATELLA LEFT KNEE;  Surgeon: Fidel Rogue, MD;  Location: [facility] OR;  Service: Orthopedics;  Laterality: Left;   PELVIC LAPAROSCOPY     PORTACATH PLACEMENT Right 07/27/2024   Procedure: INSERTION OF TUNNELED CENTRAL VENOUS DEVICE WITH PORT;  Surgeon: Vernetta Berg, MD;  Location: [facility];  Service: General;  Laterality: Right;  PORT-A-CATH INSERTION WITH ULTRASOUND GUIDANCE   SENTINEL NODE BIOPSY Left 07/27/2024   Procedure: LEFT SENTINEL LYMPH NODE BIOPSY;  Surgeon: Vernetta Berg, MD;  Location: [facility];  Service: General;  Laterality: Left;  SENTINEL NODE BIOPSY   TEE WITHOUT CARDIOVERSION N/A 08/16/2013   Procedure: TRANSESOPHAGEAL ECHOCARDIOGRAM (TEE);  Surgeon: Aleene JINNY Passe, MD;  Location: [facility] ENDOSCOPY;  Service: Cardiovascular;  Laterality: N/A;   TUBAL LIGATION  1988   Patient Active Problem List   Diagnosis Date Noted   Atrial flutter (HCC) 07/05/2024   Malignant neoplasm of upper-outer quadrant of left breast in female, estrogen receptor positive (HCC) 06/28/2024   Acute lateral meniscal tear, right, initial encounter 09/02/2023   Varicose veins of both lower extremities with  pain 07/04/2023   Arthritis of carpometacarpal [facility]) joint of both thumbs 01/17/2022   Bilateral hearing loss 01/08/2022   Hypertension 05/13/2019   Urinary incontinence 01/19/2019   Valvular heart disease    Insulin  pump in place    Fibroid    Osteoarthritis    Mixed diabetic hyperlipidemia associated with type 1 diabetes mellitus (HCC) 03/16/2015   Type 1 diabetes mellitus with complication (HCC) 05/12/2013   Chronic insomnia 03/25/2012   Osteopenia    Atherosclerosis of native coronary artery of native heart with angina pectoris 04/05/2011    REFERRING PROVIDER: Dr. Vicenta Poli  REFERRING DIAG: Left breast cancer  THERAPY DIAG:   Muscle weakness (generalized)  Aftercare following surgery for neoplasm  Abnormal posture  Malignant neoplasm of upper-outer quadrant of left breast in female, estrogen receptor positive (HCC)  Rationale for Evaluation and Treatment: Rehabilitation  ONSET DATE: 07/27/2024  SUBJECTIVE:                                                                                                                                                                                           SUBJECTIVE STATEMENT: I was sore after last time but not overly tired. I am still a little sore this morning. My pec muscle is still sore.    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 She underwent a left lumpectomy and sentinel node biopsy (4 negative nodes) on 07/27/2024. It is triple positive with a Ki67 of 3%.   PATIENT GOALS:  Reassess how my recovery is going related to arm function, pain, and swelling.  PAIN:  Are you having pain? Yes: NPRS scale: 7/10 Pain location: R shoulder Pain description: sore, hurts to touch, feels like it has a catch Aggravating factors: getting dressed, moving it Relieving factors: advil  PRECAUTIONS: Recent Surgery, left UE Lymphedema risk  RED FLAGS: None   ACTIVITY LEVEL / LEISURE: walked the dogs a couple of block on Wednesday, has not been going to the fitness center lately due to fatigue   OBJECTIVE:   PATIENT SURVEYS:  QUICK DASH:     OBSERVATIONS: Healing lumpectomy scar but increased scar tissue palpable  POSTURE:  Increased kyphosis, forward head  LYMPHEDEMA ASSESSMENT:   UPPER EXTREMITY AROM/PROM:   A/PROM RIGHT   eval    Shoulder extension 50  Shoulder flexion 147  Shoulder abduction 151  Shoulder internal rotation 72  Shoulder external rotation 43                          (Blank rows = not  tested)   A/PROM LEFT   eval LEFT 10/11/2024  Shoulder extension 51 74  Shoulder  flexion 135 162  Shoulder abduction 157 160  Shoulder internal rotation 57 65  Shoulder external rotation 79 78                          (Blank rows = not tested)   CERVICAL AROM: All within normal limits:      Percent limited  Flexion WNL  Extension WFL  Right lateral flexion 50% limited  Left lateral flexion 75% limited  Right rotation 50% limited  Left rotation           50% limited      UPPER EXTREMITY STRENGTH: WFL   LYMPHEDEMA ASSESSMENTS (in cm):    LANDMARK RIGHT   eval RIGHT 10/11/2024  10 cm proximal to olecranon process 27.7 26.1  Olecranon process 26.5 25.5  10 cm proximal to ulnar styloid process 22 20  Just proximal to ulnar styloid process 17.4 16.7  Across hand at thumb web space 18.8 19.5  At base of 2nd digit 6.9 6.7  (Blank rows = not tested)   LANDMARK LEFT   eval LEFT 10/11/2024  10 cm proximal to olecranon process 27.2 25.7  Olecranon process 26.1 25  10  cm proximal to ulnar styloid process 21.9 20.7  Just proximal to ulnar styloid process 17.7 16.9  Across hand at thumb web space 19.4 19.6  At base of 2nd digit 6.8 6.6  (Blank rows = not tested)  LOWER EXTREMITY MMT:  MMT Right eval  Hip flexion 3+  Hip extension 3  Hip abduction 4+  Hip adduction   Hip internal rotation   Hip external rotation   Knee flexion 5  Knee extension 5  Ankle dorsiflexion 5  Ankle plantarflexion   Ankle inversion   Ankle eversion    (Blank rows = not tested)  MMT LEFT eval  Hip flexion 3+  Hip extension 3  Hip abduction 4+  Hip adduction   Hip internal rotation   Hip external rotation   Knee flexion 4  Knee extension 5  Ankle dorsiflexion 4+  Ankle plantarflexion   Ankle inversion   Ankle eversion    (Blank rows = not tested)  Surgery type/Date: 07/27/2024 left lumpectomy and sentinel node biopsy Number of lymph nodes removed: 4 Current/past treatment (chemo, radiation, hormone therapy): chemotherapy Other symptoms:  Heaviness/tightness  No Pain No Pitting edema No Infections No Decreased scar mobility Yes Stemmer sign No  30 SEC SIT TO STAND: 10 reps which is below average for her age  SINGLE LIMB STANCE: R - 5 seconds, L 8 seconds  GAIT: Shortened step length bilaterally, foot flat, unsteady at time but no losses of balance  TANDEM STANCE: Tandem with R foot in from 3 sec, with L foot in front 8 seconds  TREATMENT PERFORMED: 10/21/24:  Therapeutic Exercise: Seated hamstring stretch x 30 sec holds x 2 reps bilaterally with hands on hips to keep from rounded back NuStep level 4, seat at 10, arms at 9 (no arms half way through due to hx of SLNB) x 10 min - steps 940 Therapeutic Activity: Bridging x 12 reps with 5 sec holds with pt demonstrating good form today and no hamstring cramping Pelvic tilts x 10 reps with 5 sec holds with pt demonstrating good form Scapular retraction with v/c to pull back and down x 10 reps with yellow band and cues for  upright posture and keeping elbows close Seated on red disc for core engagement: TKE with 2lb ankle weights with 3 sec holds x 10 reps bilaterally  Seated hip flexion with 2 lb ankle weights x 10 with v/c to not lean back Ball squeezes with 5 sec holds x 13  Hip abduction x 10 reps with green theraband Neuro reed: Standing on air ex with occasional HHA for balance: hip flexion x 10 reps bilaterally then hip extension x 10 bilaterally with more difficulty with balance, hip abduction x 10 bilaterally  Tandem walking x 4 on air ex beam with occasional hand held assist  10/19/24:  Therapeutic Exercise: Seated hamstring stretch x 30 sec holds x 2 reps bilaterally with v/c to keep from rounding back TKE with 2lb ankle weights with 3 sec holds x 10 reps bilaterally  Seated hip flexion with 2 lb ankle weights x 10 with v/c to not lean back NuStep level 4, seat at 10, arms at 9 (no arms half way through due to hx of SLNB) x 10 min - steps 870 Therapeutic Activity: Bridging x 10  reps with 5 sec holds with pt demonstrating good form today and no hamstring cramping Pelvic tilts x 10 reps with 5 sec holds with pt demonstrating good form Scapular retraction with v/c to pull back and down x 10 reps with yellow band and cues for upright posture and keeping elbows close Neuro reed: Standing on air ex with 1 HHA: hip flexion x 10 reps bilaterally then hip extension with v/c to keep knee straight with pt having difficulty clearing mat due to inability to hip hike  10/12/24:  Therapeutic Exercise: Seated hamstring stretch x 30 sec holds x 2 reps bilaterally with v/c to keep from rounding back TKE with 2lb ankle weights with 3 sec holds x 10 reps bilaterally  Seated hip flexion with 2 lb ankle weights x 10 with v/c to not lean back NuStep level 3, seat at 10, arms at 9 (using arms 2 min on/2 min off due to hx of SLNB) x 10 min Therapeutic Activity: Bridging x 10 reps with v/c and t/c to use the core and engage core throughout to avoid hamstring cramping Pelvic tilts x 10 reps with 5 sec holds with v/c and t/c for proper form Scapular retraction with v/c to pull back and down x 10 reps with red band   PATIENT EDUCATION:  Education details: why pec muscle is sore with new exericses Person educated: Patient Education method: Explanation Education comprehension: verbalized understanding  HOME EXERCISE PROGRAM: Reviewed previously given post op HEP. Practice sit to stands from chair without use of UEs  Access Code: [email or web address] URL: [email or web address] Date: 10/12/2024 Prepared by: Florina Lanis Carbon  Exercises - Seated Hamstring Stretch  - 1 x daily - 7 x weekly - 1 sets - 2 reps - 30 sec hold - Seated Hip Flexion March with Ankle Weights  - 1 x daily - 7 x weekly - 1 sets - 10 reps - Seated Long Arc Quad with Ankle Weight  - 1 x daily - 7 x weekly - 1 sets - 10 reps - 3 sec hold - Supine Bridge  - 1 x daily - 7 x weekly - 1 sets - 10 reps - Supine  Posterior Pelvic Tilt  - 1 x daily - 7 x weekly - 1-3 sets - 10 reps - 5 sec hold - Scapular Retraction with Resistance  - 1 x daily - 7 x weekly -  1 sets - 10 reps   ASSESSMENT:  CLINICAL IMPRESSION: Pt was a little sore after last session so did not progress resistance this session. Continued with high level balance activities today and pt required less hand held assist for this today and demonstrating improved ability to hip hike to clear foam. Pt has been compliant with her HEP. Educated pt to get a thick yoga mat for floor exercises to help decrease back discomfort. She is demonstrating good form with core exercises and improved postural awareness.  Pt will benefit from skilled therapeutic intervention to improve on the following deficits: Decreased knowledge of precautions, impaired UE functional use, pain, decreased ROM, postural dysfunction.   PT treatment/interventions: ADL/Self care home management, [phone]- PT Re-evaluation, 97110-Therapeutic exercises, 97530- Therapeutic activity, [id number]- Neuromuscular re-education, 97535- Self Care, [id number]- Manual therapy, [phone]- Gait training, and Patient/Family education   GOALS: Goals reviewed with patient? Yes  GOALS MET AT EVAL:  GOALS Name Target Date Goal status  1 Pt will be able to verbalize understanding of pertinent lymphedema risk reduction practices relevant to her dx specifically related to skin care.  Baseline:  No knowledge Eval Achieved at eval  2 Pt will be able to return demo and/or verbalize understanding of the post op HEP related to regaining shoulder ROM. Baseline:  No knowledge Eval Achieved at eval  3 Pt will be able to verbalize understanding of the importance of viewing the post op After Breast CA Class video for further lymphedema risk reduction education and therapeutic exercise.  Baseline:  No knowledge Eval Achieved at eval   LONG TERM GOALS:  (STG=LTG)  GOALS Name Target Date  Goal status  1 Pt will demonstrate  she has regained full shoulder ROM and function post operatively compared to baselines.  Baseline: 08/25/2024 INITIAL  2 Pt will demonstrate at least 4/5 bilateral hip flexion strength to decrease fall risk. 11/08/24 INITIAL  3 Pt will be able to complete SLS bilaterally for at least 15 sec to decrease fall risk. 11/08/24 INITIAL  4 Pt will be able to stand in tandem stance for 15 sec with no losses of balance to decrease fall risk. 11/08/24 INITIAL  5 Pt will be independent in a home exercise program for continued stretching and strengthening.  11/08/24 INITIAL     PLAN:  PT FREQUENCY/DURATION: 2x/wk for 4 weeks  PLAN FOR NEXT SESSION: how long did soreness last?, NuStep, hip strengthening, balance exercises, air ex beam, hamstring stretching, posture exercises, give post op handout   [facility]  [address], [address]  [place] [address]  [phone]  After Breast Cancer Class Video It is recommended you view the ABC class video to be educated on lymphedema risk reduction. This video lasts for about 30 minutes. It can be viewed on our website here: [email or web address]  Scar massage You can begin gentle scar massage to you incision sites. Gently place one hand on the incision and move the skin (without sliding on the skin) in various directions. Do this for a few minutes and then you can gently massage either coconut oil or vitamin E cream into the scars.  Compression garment You should continue wearing your compression bra until you feel like you no longer have swelling.  Home exercise Program Continue doing the exercises you were given until you feel like you can do them without feeling any tightness at the end.   Walking Program Studies show that 30 minutes of walking per day (fast enough to elevate  your heart rate) can significantly reduce the risk of a cancer recurrence. If you can't walk  due to other medical reasons, we encourage you to find another activity you could do (like a stationary bike or water exercise).  Posture After breast cancer surgery, people frequently sit with rounded shoulders posture because it puts their incisions on slack and feels better. If you sit like this and scar tissue forms in that position, you can become very tight and have pain sitting or standing with good posture. Try to be aware of your posture and sit and stand up tall to heal properly.  Follow up PT: It is recommended you return every 3 months for the first 3 years following surgery to be assessed on the SOZO machine for an L-Dex score. This helps prevent clinically significant lymphedema in 95% of patients. These follow up screens are 10 minute appointments that you are not billed for.  [facility] [place], PT 10/21/2024, 10:56 AM

## 2024-10-22 ENCOUNTER — Ambulatory Visit

## 2024-10-31 NOTE — Progress Notes (Unsigned)
 "     [facility]   [phone] [phone]    Patient Care Team: Rollene Almarie LABOR, MD as PCP - General (Internal Medicine) Lavona Agent, MD as PCP - Cardiology (Cardiology) [name], Aleene PARAS, MD (Inactive) (Cardiology) Dwane Lerner, MD (Dermatology) Waddell Danelle ORN, MD (Cardiology) Obie Princella HERO, MD (Inactive) (Gastroenterology) Claudene Arthea HERO, DO (Sports Medicine) Oman, Heather, OD as Consulting Physician (Optometry) Szabat, Toribio BROCKS, [facility] (Inactive) (Pharmacist) [name], Pasco RAMAN, DPM as Consulting Physician (Podiatry) Thapa, Sudan, MD as Consulting Physician (Endocrinology) Gerome, Devere HERO, RN as Oncology Nurse Navigator Tyree Nanetta SAILOR, RN as Oncology Nurse Navigator Vernetta Berg, MD as Consulting Physician (General Surgery) Lanny Callander, MD as Consulting Physician (Hematology) Dewey Rush, MD as Consulting Physician (Radiation Oncology)   CHIEF COMPLAINT: Follow up left breast cancer   Oncology History  Malignant neoplasm of upper-outer quadrant of left breast in female, estrogen receptor positive (HCC)  06/22/2024 Cancer Staging   Staging form: Breast, AJCC 8th Edition - Clinical stage from 06/22/2024: Stage IB (cT2, cN0, cM0, G2, ER+, PR+, HER2+) - Signed by Lanny Callander, MD on 06/30/2024 Stage prefix: Initial diagnosis Histologic grading system: 3 grade system   06/28/2024 Initial Diagnosis   Malignant neoplasm of upper-outer quadrant of left breast in female, estrogen receptor positive (HCC)   07/27/2024 Cancer Staging   Staging form: Breast, AJCC 8th Edition - Pathologic stage from 07/27/2024: Stage IA (pT2, pN0, cM0, G2, ER+, PR+, HER2+) - Signed by Lanny Callander, MD on 08/08/2024 Stage prefix: Initial diagnosis Histologic grading system: 3 grade system Residual tumor (R): R0   08/25/2024 -  Chemotherapy   Patient is on Treatment Plan : BREAST Docetaxel  + Carboplatin  + Trastuzumab  (TCH) q21d / Trastuzumab  q21d         CURRENT THERAPY: Adjuvant TCH q21 days x6, followed by herceptin  maintenance to complete 1 year  INTERVAL HISTORY [name] returns for follow up and treatment. Cycle 3 with mild dose reduction was much more tolerable.  Energy and appetite recovered much quicker, no nausea, and for 10 -14 days she felt fabulous.  Able to celebrate the holiday with her family.  Denies fever, chills, cough, dyspnea/orthopnea, leg edema, neuropathy, or any other new or specific complaints.  ROS  All other systems reviewed and negative  Past Medical History:  Diagnosis Date   Arthritis    OA   Atrial flutter (HCC)    a. s/p TEE/DCCV 08/16/13 (normal LV function, no LAA thrombus).b. s/p ablation by Dr Waddell 09-23-2013   Coronary artery disease    2009 LAD Promus stent   Dysrhythmia    Aflutter, no issues after ablation 2014   Fibroid    Hyperlipidemia    Hypertension    Insulin  pump in place    Osteopenia    Type 1 diabetes mellitus on insulin  therapy (HCC)    Valvular heart disease    a. Mild MR/TR by TEE 08/2013.     Past Surgical History:  Procedure Laterality Date   ABLATION  09/23/2013   CTI by Dr Waddell   APPENDECTOMY     APPLICATION OF WOUND VAC Left 04/11/2017   Procedure: APPLICATION OF WOUND VAC LEFT KNEE;  Surgeon: Fidel Rogue, MD;  Location: [facility] OR;  Service: Orthopedics;  Laterality: Left;   ATRIAL FLUTTER ABLATION N/A 09/23/2013   Procedure: ATRIAL FLUTTER ABLATION;  Surgeon: Danelle ORN Waddell, MD;  Location: [facility] CATH LAB;  Service: Cardiovascular;  Laterality: N/A;   CARDIOVERSION N/A 08/16/2013  Procedure: CARDIOVERSION;  Surgeon: Aleene JINNY Passe, MD;  Location: [facility] ENDOSCOPY;  Service: Cardiovascular;  Laterality: N/A;  spoke with Tom   CATARACT EXTRACTION Bilateral    COLONOSCOPY     CORONARY ANGIOPLASTY WITH STENT PLACEMENT     EYE SURGERY     Cataract   LASER ABLATION Right 11/20/2023   Right leg laser ablation of the Greater saphenous vein with >20 stab phlebectomies    MYOMECTOMY  1977   ORIF PATELLA Left 04/11/2017   ORIF PATELLA Left 04/11/2017   Procedure: OPEN REDUCTION INTERNAL (ORIF) FIXATION PATELLA LEFT KNEE;  Surgeon: Fidel Rogue, MD;  Location: [facility] OR;  Service: Orthopedics;  Laterality: Left;   PELVIC LAPAROSCOPY     PORTACATH PLACEMENT Right 07/27/2024   Procedure: INSERTION OF TUNNELED CENTRAL VENOUS DEVICE WITH PORT;  Surgeon: Vernetta Berg, MD;  Location: [facility];  Service: General;  Laterality: Right;  PORT-A-CATH INSERTION WITH ULTRASOUND GUIDANCE   SENTINEL NODE BIOPSY Left 07/27/2024   Procedure: LEFT SENTINEL LYMPH NODE BIOPSY;  Surgeon: Vernetta Berg, MD;  Location: [facility];  Service: General;  Laterality: Left;  SENTINEL NODE BIOPSY   TEE WITHOUT CARDIOVERSION N/A 08/16/2013   Procedure: TRANSESOPHAGEAL ECHOCARDIOGRAM (TEE);  Surgeon: Aleene JINNY Passe, MD;  Location: [facility] ENDOSCOPY;  Service: Cardiovascular;  Laterality: N/A;   TUBAL LIGATION  1988     Outpatient Encounter Medications as of 11/02/2024  Medication Sig   Calcium  Carbonate-Vitamin D  (CALCIUM  + D PO) Take 600 mg by mouth 2 (two) times daily.   cholecalciferol  (VITAMIN D ) 1000 units tablet Take 1,000 Units by mouth 2 (two) times daily.    clopidogrel  (PLAVIX ) 75 MG tablet TAKE 1 TABLET DAILY   Continuous Glucose Sensor (DEXCOM G7 15 DAY SENSOR) MISC by Does not apply route.   cyanocobalamin  (VITAMIN B12) 1000 MCG tablet Take 1,000 mcg by mouth daily.   dexamethasone  (DECADRON ) 4 MG tablet Take 1 tabs by mouth 2 times daily starting day before chemo. Then take 1 tabs daily for 2 days starting day after chemo. Take with food.   ezetimibe  (ZETIA ) 10 MG tablet Take 1 tablet (10 mg total) by mouth daily.   gabapentin  (NEURONTIN ) 100 MG capsule Take 2 capsules (200 mg total) by mouth at bedtime.   Insulin  Lispro-aabc (LYUMJEV ) 100 UNIT/ML SOLN USE A MAXIMUM OF 90 UNITS DAILY VIA INSULIN  PUMP   lidocaine -prilocaine  (EMLA ) cream Apply  to affected area once   losartan  (COZAAR ) 100 MG tablet Take 1 tablet (100 mg total) by mouth daily.   metFORMIN  (GLUCOPHAGE -XR) 500 MG 24 hr tablet TAKE 1 TABLET IN THE MORNING AND 2 TABLETS IN THE EVENING AS DIRECTED   metoprolol  succinate (TOPROL -XL) 100 MG 24 hr tablet TAKE 1 TABLET DAILY   Multiple Vitamins-Minerals (PRESERVISION AREDS 2 PO) Take by mouth in the morning and at bedtime.   nitroGLYCERIN  (NITROSTAT ) 0.4 MG SL tablet DISSOLVE 1 TABLET UNDER THE TONGUE EVERY 5 MINUTES AS NEEDED FOR CHEST PAIN   ondansetron  (ZOFRAN ) 8 MG tablet Take 1 tablet (8 mg total) by mouth every 8 (eight) hours as needed for nausea or vomiting. Start on the third day after chemotherapy.   prochlorperazine  (COMPAZINE ) 10 MG tablet Take 1 tablet (10 mg total) by mouth every 6 (six) hours as needed for nausea or vomiting.   Pyridoxine HCl (VITAMIN B6) 100 MG TABS    rosuvastatin  (CRESTOR ) 40 MG tablet Take 1 tablet (40 mg total) by mouth daily.   solifenacin  (VESICARE ) 10 MG  tablet TAKE 1 TABLET DAILY   spironolactone  (ALDACTONE ) 25 MG tablet Take 1 tablet (25 mg total) by mouth daily.   TART CHERRY PO Take by mouth.   traMADol  (ULTRAM ) 50 MG tablet Take 1 tablet (50 mg total) by mouth every 6 (six) hours as needed for moderate pain (pain score 4-6) or severe pain (pain score 7-10).   No facility-administered encounter medications on file as of 11/02/2024.     Today's Vitals   11/02/24 0800  BP: (!) 127/55  Pulse: 83  Resp: 16  Temp: 97.8 F (36.6 C)  TempSrc: Temporal  SpO2: 99%  Weight: 190 lb 4.8 oz (86.3 kg)  Height: 5' 9 (1.753 m)  PainSc: 0-No pain   Body mass index is 28.1 kg/m.   ECOG PERFORMANCE STATUS: 1 - Symptomatic but completely ambulatory  PHYSICAL EXAM GENERAL:alert, no distress and comfortable SKIN: no rash  HEENT no thrush or ulcers.  Sclera clear LUNGS: clear with normal breathing effort HEART: regular rate & rhythm, no lower extremity edema ABDOMEN: abdomen soft,  non-tender and normal bowel sounds NEURO: alert & oriented x 3 with fluent speech, no focal motor/sensory deficits Breast exam: Deferred PAC without erythema    CBC    Latest Ref Rng & Units 11/02/2024    8:40 AM 10/05/2024    8:33 AM 09/14/2024    8:53 AM  CBC  WBC 4.0 - 10.5 K/uL 9.2  11.8  14.3   Hemoglobin 12.0 - 15.0 g/dL 88.7  89.2  88.3   Hematocrit 36.0 - 46.0 % 33.4  32.2  34.0   Platelets 150 - 400 K/uL 444  435  358       CMP     Latest Ref Rng & Units 11/02/2024    8:40 AM 10/05/2024    8:33 AM 09/16/2024    9:14 AM  CMP  Glucose 70 - 99 mg/dL 824  885  888   BUN 8 - 23 mg/dL 19  23  13    Creatinine 0.44 - 1.00 mg/dL 9.28  9.05  9.34   Sodium 135 - 145 mmol/L 140  138  137   Potassium 3.5 - 5.1 mmol/L 4.3  4.3  4.4   Chloride 98 - 111 mmol/L 105  102  102   CO2 22 - 32 mmol/L 25  24  22    Calcium  8.9 - 10.3 mg/dL 9.7  9.9  9.3   Total Protein 6.5 - 8.1 g/dL 6.5  6.4    Total Bilirubin 0.0 - 1.2 mg/dL 0.5  0.3    Alkaline Phos 38 - 126 U/L 105  98    AST 15 - 41 U/L 29  24    ALT 0 - 44 U/L 37  39        ASSESSMENT & PLAN:  Malignant neoplasm of upper-outer quadrant of left breast in female, estrogen receptor positive, pT2N0M0, stage IA,G2 invasive lobular carcinoma, triple positive -Discovered during screening mammogram. 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  4 sentinel lymph nodes were negative.  Margins were negative for invasive carcinoma, but positive for LCIS. -Recommendation for adjuvant TCH every 3 weeksX6 followed by trastuzumab  maintenance therapy to complete a 1 year therapy. She started on 08/25/2024 - Ms. Desilva appears stable, s/p cycle 3 dose reduced carbo/Taxotere  and Herceptin , tolerated the mild dose reduction much better.  Able to recover quicker and function with adequate performance status.  Side effects are adequately managed with supportive  care at home. - I reviewed her echo which shows normal EF but now abnormal GLS and grade II diastolic dysfunction.  I discussed the case with Dr. Lavona who reviewed the echo; okay to continue Herceptin  with short interval echo his office will arrange. Reviewed the above with patient aware of cardiotoxicity with her2 antibody. Agrees with the plan.    PLAN: -Labs reviewed -Discussed echo with Dr. Lavona, ok to continue Herceptin , short interval echo to be arranged by his office -Proceed with C4 TCH with same dose reductions as cycle 3; injection 1/2 -F/up and cycle 5 in 3 weeks -We are available to see her sooner if needed    All questions were answered. The patient knows to call the clinic with any problems, questions or concerns. No barriers to learning were detected. I spent 20 minutes counseling the patient face to face. The total time spent in the appointment was 30 minutes and more than 50% was on counseling, review of test results, and coordination of care.   Audrey Self K Norberta Stobaugh, NP 11/02/2024  "

## 2024-11-01 ENCOUNTER — Encounter: Payer: Self-pay | Admitting: Physical Therapy

## 2024-11-01 ENCOUNTER — Ambulatory Visit: Admitting: Physical Therapy

## 2024-11-01 DIAGNOSIS — Z170 Estrogen receptor positive status [ER+]: Secondary | ICD-10-CM

## 2024-11-01 DIAGNOSIS — M6281 Muscle weakness (generalized): Secondary | ICD-10-CM | POA: Diagnosis not present

## 2024-11-01 DIAGNOSIS — R293 Abnormal posture: Secondary | ICD-10-CM

## 2024-11-01 DIAGNOSIS — Z483 Aftercare following surgery for neoplasm: Secondary | ICD-10-CM

## 2024-11-01 NOTE — Therapy (Signed)
 " OUTPATIENT PHYSICAL THERAPY BREAST CANCER POST OP FOLLOW UP   Patient Name: [name] MRN: [id number] DOB:1946/06/02, 78 y.o., female Today's Date: 11/01/2024  END OF SESSION:  PT End of Session - 11/01/24 1304     Visit Number 6    Number of Visits 10    Date for Recertification  11/08/24    PT Start Time 1205    PT Stop Time 1301    PT Time Calculation (min) 56 min    Activity Tolerance Patient tolerated treatment well    Behavior During Therapy [facility] for tasks assessed/performed            Past Medical History:  Diagnosis Date   Arthritis    OA   Atrial flutter (HCC)    a. s/p TEE/DCCV 08/16/13 (normal LV function, no LAA thrombus).b. s/p ablation by Dr Waddell 09-23-2013   Coronary artery disease    2009 LAD Promus stent   Dysrhythmia    Aflutter, no issues after ablation 2014   Fibroid    Hyperlipidemia    Hypertension    Insulin  pump in place    Osteopenia    Type 1 diabetes mellitus on insulin  therapy (HCC)    Valvular heart disease    a. Mild MR/TR by TEE 08/2013.   Past Surgical History:  Procedure Laterality Date   ABLATION  09/23/2013   CTI by Dr Waddell   APPENDECTOMY     APPLICATION OF WOUND VAC Left 04/11/2017   Procedure: APPLICATION OF WOUND VAC LEFT KNEE;  Surgeon: Fidel Rogue, MD;  Location: [facility] OR;  Service: Orthopedics;  Laterality: Left;   ATRIAL FLUTTER ABLATION N/A 09/23/2013   Procedure: ATRIAL FLUTTER ABLATION;  Surgeon: Danelle LELON Waddell, MD;  Location: [facility] CATH LAB;  Service: Cardiovascular;  Laterality: N/A;   CARDIOVERSION N/A 08/16/2013   Procedure: CARDIOVERSION;  Surgeon: Aleene JINNY Passe, MD;  Location: [facility] ENDOSCOPY;  Service: Cardiovascular;  Laterality: N/A;  spoke with Tom   CATARACT EXTRACTION Bilateral    COLONOSCOPY     CORONARY ANGIOPLASTY WITH STENT PLACEMENT     EYE SURGERY     Cataract   LASER ABLATION Right 11/20/2023   Right leg laser ablation of the Greater saphenous vein with >20 stab phlebectomies    MYOMECTOMY  1977   ORIF PATELLA Left 04/11/2017   ORIF PATELLA Left 04/11/2017   Procedure: OPEN REDUCTION INTERNAL (ORIF) FIXATION PATELLA LEFT KNEE;  Surgeon: Fidel Rogue, MD;  Location: [facility] OR;  Service: Orthopedics;  Laterality: Left;   PELVIC LAPAROSCOPY     PORTACATH PLACEMENT Right 07/27/2024   Procedure: INSERTION OF TUNNELED CENTRAL VENOUS DEVICE WITH PORT;  Surgeon: Vernetta Berg, MD;  Location: [facility];  Service: General;  Laterality: Right;  PORT-A-CATH INSERTION WITH ULTRASOUND GUIDANCE   SENTINEL NODE BIOPSY Left 07/27/2024   Procedure: LEFT SENTINEL LYMPH NODE BIOPSY;  Surgeon: Vernetta Berg, MD;  Location: [facility];  Service: General;  Laterality: Left;  SENTINEL NODE BIOPSY   TEE WITHOUT CARDIOVERSION N/A 08/16/2013   Procedure: TRANSESOPHAGEAL ECHOCARDIOGRAM (TEE);  Surgeon: Aleene JINNY Passe, MD;  Location: [facility] ENDOSCOPY;  Service: Cardiovascular;  Laterality: N/A;   TUBAL LIGATION  1988   Patient Active Problem List   Diagnosis Date Noted   Atrial flutter (HCC) 07/05/2024   Malignant neoplasm of upper-outer quadrant of left breast in female, estrogen receptor positive (HCC) 06/28/2024   Acute lateral meniscal tear, right, initial encounter 09/02/2023   Varicose veins of both lower  extremities with pain 07/04/2023   Arthritis of carpometacarpal [facility]) joint of both thumbs 01/17/2022   Bilateral hearing loss 01/08/2022   Hypertension 05/13/2019   Urinary incontinence 01/19/2019   Valvular heart disease    Insulin  pump in place    Fibroid    Osteoarthritis    Mixed diabetic hyperlipidemia associated with type 1 diabetes mellitus (HCC) 03/16/2015   Type 1 diabetes mellitus with complication (HCC) 05/12/2013   Chronic insomnia 03/25/2012   Osteopenia    Atherosclerosis of native coronary artery of native heart with angina pectoris 04/05/2011    REFERRING PROVIDER: Dr. Vicenta Poli  REFERRING DIAG: Left breast  cancer  THERAPY DIAG:  Muscle weakness (generalized)  Aftercare following surgery for neoplasm  Abnormal posture  Malignant neoplasm of upper-outer quadrant of left breast in female, estrogen receptor positive (HCC)  Rationale for Evaluation and Treatment: Rehabilitation  ONSET DATE: 07/27/2024  SUBJECTIVE:                                                                                                                                                                                           SUBJECTIVE STATEMENT: Exercise has not been as on target as it should because it has been so busy.   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 She underwent a left lumpectomy and sentinel node biopsy (4 negative nodes) on 07/27/2024. It is triple positive with a Ki67 of 3%.   PATIENT GOALS:  Reassess how my recovery is going related to arm function, pain, and swelling.  PAIN:  Are you having pain? Yes: NPRS scale: 3/10 Pain location: R shoulder Pain description: sore, hurts to touch, feels like it has a catch Aggravating factors: getting dressed, moving it Relieving factors: advil  PRECAUTIONS: Recent Surgery, left UE Lymphedema risk  RED FLAGS: None   ACTIVITY LEVEL / LEISURE: walked the dogs a couple of block on Wednesday, has not been going to the fitness center lately due to fatigue   OBJECTIVE:   PATIENT SURVEYS:  QUICK DASH:     OBSERVATIONS: Healing lumpectomy scar but increased scar tissue palpable  POSTURE:  Increased kyphosis, forward head  LYMPHEDEMA ASSESSMENT:   UPPER EXTREMITY AROM/PROM:   A/PROM RIGHT   eval    Shoulder extension 50  Shoulder flexion 147  Shoulder abduction 151  Shoulder internal rotation 72  Shoulder external rotation 43                          (Blank rows = not tested)   A/PROM LEFT  eval LEFT 10/11/2024  Shoulder extension 51 74  Shoulder flexion 135 162   Shoulder abduction 157 160  Shoulder internal rotation 57 65  Shoulder external rotation 79 78                          (Blank rows = not tested)   CERVICAL AROM: All within normal limits:      Percent limited  Flexion WNL  Extension WFL  Right lateral flexion 50% limited  Left lateral flexion 75% limited  Right rotation 50% limited  Left rotation           50% limited      UPPER EXTREMITY STRENGTH: WFL   LYMPHEDEMA ASSESSMENTS (in cm):    LANDMARK RIGHT   eval RIGHT 10/11/2024  10 cm proximal to olecranon process 27.7 26.1  Olecranon process 26.5 25.5  10 cm proximal to ulnar styloid process 22 20  Just proximal to ulnar styloid process 17.4 16.7  Across hand at thumb web space 18.8 19.5  At base of 2nd digit 6.9 6.7  (Blank rows = not tested)   LANDMARK LEFT   eval LEFT 10/11/2024  10 cm proximal to olecranon process 27.2 25.7  Olecranon process 26.1 25  10  cm proximal to ulnar styloid process 21.9 20.7  Just proximal to ulnar styloid process 17.7 16.9  Across hand at thumb web space 19.4 19.6  At base of 2nd digit 6.8 6.6  (Blank rows = not tested)  LOWER EXTREMITY MMT:  MMT Right eval  Hip flexion 3+  Hip extension 3  Hip abduction 4+  Hip adduction   Hip internal rotation   Hip external rotation   Knee flexion 5  Knee extension 5  Ankle dorsiflexion 5  Ankle plantarflexion   Ankle inversion   Ankle eversion    (Blank rows = not tested)  MMT LEFT eval  Hip flexion 3+  Hip extension 3  Hip abduction 4+  Hip adduction   Hip internal rotation   Hip external rotation   Knee flexion 4  Knee extension 5  Ankle dorsiflexion 4+  Ankle plantarflexion   Ankle inversion   Ankle eversion    (Blank rows = not tested)  Surgery type/Date: 07/27/2024 left lumpectomy and sentinel node biopsy Number of lymph nodes removed: 4 Current/past treatment (chemo, radiation, hormone therapy): chemotherapy Other symptoms:  Heaviness/tightness No Pain  No Pitting edema No Infections No Decreased scar mobility Yes Stemmer sign No  30 SEC SIT TO STAND: 10 reps which is below average for her age  SINGLE LIMB STANCE: R - 5 seconds, L 8 seconds  GAIT: Shortened step length bilaterally, foot flat, unsteady at time but no losses of balance  TANDEM STANCE: Tandem with R foot in from 3 sec, with L foot in front 8 seconds  TREATMENT PERFORMED: 11/01/24:  Therapeutic Exercise: Seated hamstring stretch x 30 sec holds x 2 reps bilaterally with hands on hips to keep from rounded back NuStep level 4, seat at 10, arms at 9 x 10 min - steps 835 3 way hip machine: 25 lbs in flexion, abduction and extension x 10 each bilaterally with pt returning therapist demo Leg press seat at 7, 70 lbs- 2 sets of 10 reps Dual cable machine: 3 lbs for scap retraction x 10 reps with pt returning therapist demo Therapeutic Activity: Bridging x 10 reps with 5 sec holds with v/c to engage core throughout and not just push up  with glutes Pelvic tilts x 10 reps with 5 sec holds with pt demonstrating good form Seated on red disc for core engagement: TKE with 2lb ankle weights with 3 sec holds x 10 reps bilaterally  Seated hip flexion with 2 lb ankle weights x 10 with v/c to not lean back Ball squeezes with 5 sec holds x 10   10/21/24:  Therapeutic Exercise: Seated hamstring stretch x 30 sec holds x 2 reps bilaterally with hands on hips to keep from rounded back NuStep level 4, seat at 10, arms at 9 (no arms half way through due to hx of SLNB) x 10 min - steps 940 Therapeutic Activity: Bridging x 12 reps with 5 sec holds with pt demonstrating good form today and no hamstring cramping Pelvic tilts x 10 reps with 5 sec holds with pt demonstrating good form Scapular retraction with v/c to pull back and down x 10 reps with yellow band and cues for upright posture and keeping elbows close Seated on red disc for core engagement: TKE with 2lb ankle weights with 3 sec holds  x 10 reps bilaterally  Seated hip flexion with 2 lb ankle weights x 10 with v/c to not lean back Ball squeezes with 5 sec holds x 13  Hip abduction x 10 reps with green theraband Neuro reed: Standing on air ex with occasional HHA for balance: hip flexion x 10 reps bilaterally then hip extension x 10 bilaterally with more difficulty with balance, hip abduction x 10 bilaterally  Tandem walking x 4 on air ex beam with occasional hand held assist  10/19/24:  Therapeutic Exercise: Seated hamstring stretch x 30 sec holds x 2 reps bilaterally with v/c to keep from rounding back TKE with 2lb ankle weights with 3 sec holds x 10 reps bilaterally  Seated hip flexion with 2 lb ankle weights x 10 with v/c to not lean back NuStep level 4, seat at 10, arms at 9 (no arms half way through due to hx of SLNB) x 10 min - steps 870 Therapeutic Activity: Bridging x 10 reps with 5 sec holds with pt demonstrating good form today and no hamstring cramping Pelvic tilts x 10 reps with 5 sec holds with pt demonstrating good form Scapular retraction with v/c to pull back and down x 10 reps with yellow band and cues for upright posture and keeping elbows close Neuro reed: Standing on air ex with 1 HHA: hip flexion x 10 reps bilaterally then hip extension with v/c to keep knee straight with pt having difficulty clearing mat due to inability to hip hike  10/12/24:  Therapeutic Exercise: Seated hamstring stretch x 30 sec holds x 2 reps bilaterally with v/c to keep from rounding back TKE with 2lb ankle weights with 3 sec holds x 10 reps bilaterally  Seated hip flexion with 2 lb ankle weights x 10 with v/c to not lean back NuStep level 3, seat at 10, arms at 9 (using arms 2 min on/2 min off due to hx of SLNB) x 10 min Therapeutic Activity: Bridging x 10 reps with v/c and t/c to use the core and engage core throughout to avoid hamstring cramping Pelvic tilts x 10 reps with 5 sec holds with v/c and t/c for proper  form Scapular retraction with v/c to pull back and down x 10 reps with red band   PATIENT EDUCATION:  Education details: why pec muscle is sore with new exericses Person educated: Patient Education method: Explanation Education comprehension: verbalized understanding  HOME EXERCISE PROGRAM: Reviewed previously given post op HEP. Practice sit to stands from chair without use of UEs  Access Code: [email or web address] URL: [email or web address] Date: 10/12/2024 Prepared by: Florina Lanis Carbon  Exercises - Seated Hamstring Stretch  - 1 x daily - 7 x weekly - 1 sets - 2 reps - 30 sec hold - Seated Hip Flexion March with Ankle Weights  - 1 x daily - 7 x weekly - 1 sets - 10 reps - Seated Long Arc Quad with Ankle Weight  - 1 x daily - 7 x weekly - 1 sets - 10 reps - 3 sec hold - Supine Bridge  - 1 x daily - 7 x weekly - 1 sets - 10 reps - Supine Posterior Pelvic Tilt  - 1 x daily - 7 x weekly - 1-3 sets - 10 reps - 5 sec hold - Scapular Retraction with Resistance  - 1 x daily - 7 x weekly - 1 sets - 10 reps   ASSESSMENT:  CLINICAL IMPRESSION: Pt is having less pain at the R pec than last session. Added new strengthening exercises with dual cable machine, hip machine and leg press today. Pt did excellent with these and may be able to progess resistance at next session if she did not have increased soreness. She is more aware of her posture and tries to self correct througout the day.   Pt will benefit from skilled therapeutic intervention to improve on the following deficits: Decreased knowledge of precautions, impaired UE functional use, pain, decreased ROM, postural dysfunction.   PT treatment/interventions: ADL/Self care home management, [phone]- PT Re-evaluation, 97110-Therapeutic exercises, 97530- Therapeutic activity, [id number]- Neuromuscular re-education, 97535- Self Care, [id number]- Manual therapy, [phone]- Gait training, and Patient/Family education   GOALS: Goals reviewed with  patient? Yes  GOALS MET AT EVAL:  GOALS Name Target Date Goal status  1 Pt will be able to verbalize understanding of pertinent lymphedema risk reduction practices relevant to her dx specifically related to skin care.  Baseline:  No knowledge Eval Achieved at eval  2 Pt will be able to return demo and/or verbalize understanding of the post op HEP related to regaining shoulder ROM. Baseline:  No knowledge Eval Achieved at eval  3 Pt will be able to verbalize understanding of the importance of viewing the post op After Breast CA Class video for further lymphedema risk reduction education and therapeutic exercise.  Baseline:  No knowledge Eval Achieved at eval   LONG TERM GOALS:  (STG=LTG)  GOALS Name Target Date  Goal status  1 Pt will demonstrate she has regained full shoulder ROM and function post operatively compared to baselines.  Baseline: 08/25/2024 INITIAL  2 Pt will demonstrate at least 4/5 bilateral hip flexion strength to decrease fall risk. 11/08/24 INITIAL  3 Pt will be able to complete SLS bilaterally for at least 15 sec to decrease fall risk. 11/08/24 INITIAL  4 Pt will be able to stand in tandem stance for 15 sec with no losses of balance to decrease fall risk. 11/08/24 INITIAL  5 Pt will be independent in a home exercise program for continued stretching and strengthening.  11/08/24 INITIAL     PLAN:  PT FREQUENCY/DURATION: 2x/wk for 4 weeks  PLAN FOR NEXT SESSION: how long did soreness last?, NuStep, hip strengthening, balance exercises, air ex beam, hamstring stretching, posture exercises, give post op handout   [facility]  [address]  [place] [address]  [phone]  After Breast  Cancer Class Video It is recommended you view the ABC class video to be educated on lymphedema risk reduction. This video lasts for about 30 minutes. It can be viewed on our website here:  [email or web address]  Scar massage You can begin gentle scar massage to you incision sites. Gently place one hand on the incision and move the skin (without sliding on the skin) in various directions. Do this for a few minutes and then you can gently massage either coconut oil or vitamin E cream into the scars.  Compression garment You should continue wearing your compression bra until you feel like you no longer have swelling.  Home exercise Program Continue doing the exercises you were given until you feel like you can do them without feeling any tightness at the end.   Walking Program Studies show that 30 minutes of walking per day (fast enough to elevate your heart rate) can significantly reduce the risk of a cancer recurrence. If you can't walk due to other medical reasons, we encourage you to find another activity you could do (like a stationary bike or water exercise).  Posture After breast cancer surgery, people frequently sit with rounded shoulders posture because it puts their incisions on slack and feels better. If you sit like this and scar tissue forms in that position, you can become very tight and have pain sitting or standing with good posture. Try to be aware of your posture and sit and stand up tall to heal properly.  Follow up PT: It is recommended you return every 3 months for the first 3 years following surgery to be assessed on the SOZO machine for an L-Dex score. This helps prevent clinically significant lymphedema in 95% of patients. These follow up screens are 10 minute appointments that you are not billed for.  [facility], PT 11/01/2024, 1:05 PM  "

## 2024-11-02 ENCOUNTER — Inpatient Hospital Stay

## 2024-11-02 ENCOUNTER — Inpatient Hospital Stay (HOSPITAL_BASED_OUTPATIENT_CLINIC_OR_DEPARTMENT_OTHER): Admitting: Nurse Practitioner

## 2024-11-02 ENCOUNTER — Encounter: Payer: Self-pay | Admitting: Nurse Practitioner

## 2024-11-02 VITALS — BP 127/55 | HR 83 | Temp 97.8°F | Resp 16 | Ht 69.0 in | Wt 190.3 lb

## 2024-11-02 DIAGNOSIS — Z170 Estrogen receptor positive status [ER+]: Secondary | ICD-10-CM

## 2024-11-02 DIAGNOSIS — C50412 Malignant neoplasm of upper-outer quadrant of left female breast: Secondary | ICD-10-CM | POA: Diagnosis not present

## 2024-11-02 DIAGNOSIS — R9431 Abnormal electrocardiogram [ECG] [EKG]: Secondary | ICD-10-CM

## 2024-11-02 DIAGNOSIS — Z5111 Encounter for antineoplastic chemotherapy: Secondary | ICD-10-CM | POA: Diagnosis not present

## 2024-11-02 LAB — CBC WITH DIFFERENTIAL (CANCER CENTER ONLY)
Abs Immature Granulocytes: 0.04 K/uL (ref 0.00–0.07)
Basophils Absolute: 0 K/uL (ref 0.0–0.1)
Basophils Relative: 0 %
Eosinophils Absolute: 0 K/uL (ref 0.0–0.5)
Eosinophils Relative: 0 %
HCT: 33.4 % — ABNORMAL LOW (ref 36.0–46.0)
Hemoglobin: 11.2 g/dL — ABNORMAL LOW (ref 12.0–15.0)
Immature Granulocytes: 0 %
Lymphocytes Relative: 8 %
Lymphs Abs: 0.7 K/uL (ref 0.7–4.0)
MCH: 31.5 pg (ref 26.0–34.0)
MCHC: 33.5 g/dL (ref 30.0–36.0)
MCV: 93.8 fL (ref 80.0–100.0)
Monocytes Absolute: 0.5 K/uL (ref 0.1–1.0)
Monocytes Relative: 6 %
Neutro Abs: 8 K/uL — ABNORMAL HIGH (ref 1.7–7.7)
Neutrophils Relative %: 86 %
Platelet Count: 444 K/uL — ABNORMAL HIGH (ref 150–400)
RBC: 3.56 MIL/uL — ABNORMAL LOW (ref 3.87–5.11)
RDW: 17.2 % — ABNORMAL HIGH (ref 11.5–15.5)
WBC Count: 9.2 K/uL (ref 4.0–10.5)
nRBC: 0 % (ref 0.0–0.2)

## 2024-11-02 LAB — CMP (CANCER CENTER ONLY)
ALT: 37 U/L (ref 0–44)
AST: 29 U/L (ref 15–41)
Albumin: 4.1 g/dL (ref 3.5–5.0)
Alkaline Phosphatase: 105 U/L (ref 38–126)
Anion gap: 10 (ref 5–15)
BUN: 19 mg/dL (ref 8–23)
CO2: 25 mmol/L (ref 22–32)
Calcium: 9.7 mg/dL (ref 8.9–10.3)
Chloride: 105 mmol/L (ref 98–111)
Creatinine: 0.71 mg/dL (ref 0.44–1.00)
GFR, Estimated: >60 mL/min
Glucose, Bld: 175 mg/dL — ABNORMAL HIGH (ref 70–99)
Potassium: 4.3 mmol/L (ref 3.5–5.1)
Sodium: 140 mmol/L (ref 135–145)
Total Bilirubin: 0.5 mg/dL (ref 0.0–1.2)
Total Protein: 6.5 g/dL (ref 6.5–8.1)

## 2024-11-02 MED ORDER — TRASTUZUMAB-DTTB CHEMO 150 MG IV SOLR
6.0000 mg/kg | Freq: Once | INTRAVENOUS | Status: AC
  Administered 2024-11-02: 525 mg via INTRAVENOUS
  Filled 2024-11-02: qty 25

## 2024-11-02 MED ORDER — ACETAMINOPHEN 325 MG PO TABS
650.0000 mg | ORAL_TABLET | Freq: Once | ORAL | Status: AC
  Administered 2024-11-02: 650 mg via ORAL
  Filled 2024-11-02: qty 2

## 2024-11-02 MED ORDER — DEXAMETHASONE SOD PHOSPHATE PF 10 MG/ML IJ SOLN
10.0000 mg | Freq: Once | INTRAMUSCULAR | Status: AC
  Administered 2024-11-02: 10 mg via INTRAVENOUS

## 2024-11-02 MED ORDER — SODIUM CHLORIDE 0.9 % IV SOLN
404.3600 mg | Freq: Once | INTRAVENOUS | Status: AC
  Administered 2024-11-02: 400 mg via INTRAVENOUS
  Filled 2024-11-02: qty 40

## 2024-11-02 MED ORDER — SODIUM CHLORIDE 0.9 % IV SOLN: INTRAVENOUS | Status: DC

## 2024-11-02 MED ORDER — DIPHENHYDRAMINE HCL 25 MG PO CAPS
50.0000 mg | ORAL_CAPSULE | Freq: Once | ORAL | Status: AC
  Administered 2024-11-02: 50 mg via ORAL
  Filled 2024-11-02: qty 2

## 2024-11-02 MED ORDER — PALONOSETRON HCL INJECTION 0.25 MG/5ML
0.2500 mg | Freq: Once | INTRAVENOUS | Status: AC
  Administered 2024-11-02: 0.25 mg via INTRAVENOUS
  Filled 2024-11-02: qty 5

## 2024-11-02 MED ORDER — APREPITANT 130 MG/18ML IV EMUL
130.0000 mg | Freq: Once | INTRAVENOUS | Status: AC
  Administered 2024-11-02: 130 mg via INTRAVENOUS
  Filled 2024-11-02: qty 18

## 2024-11-02 MED ORDER — SODIUM CHLORIDE 0.9 % IV SOLN
75.0000 mg/m2 | Freq: Once | INTRAVENOUS | Status: AC
  Administered 2024-11-02: 160 mg via INTRAVENOUS
  Filled 2024-11-02: qty 16

## 2024-11-02 NOTE — Patient Instructions (Signed)
 [facility] - A DEPT OF [facility]  Discharge Instructions: Thank you for choosing [facility] to provide your oncology and hematology care.   If you have a lab appointment with the Cancer Center, please go directly to the Cancer Center and check in at the registration area.   Wear comfortable clothing and clothing appropriate for easy access to any Portacath or PICC line.   We strive to give you quality time with your provider. You may need to reschedule your appointment if you arrive late (15 or more minutes).  Arriving late affects you and other patients whose appointments are after yours.  Also, if you miss three or more appointments without notifying the office, you may be dismissed from the clinic at the provider's discretion.      For prescription refill requests, have your pharmacy contact our office and allow 72 hours for refills to be completed.    Today you received the following chemotherapy and/or immunotherapy agents trastuzumab, docetaxel, carboplatin      To help prevent nausea and vomiting after your treatment, we encourage you to take your nausea medication as directed.  BELOW ARE SYMPTOMS THAT SHOULD BE REPORTED IMMEDIATELY: *FEVER GREATER THAN 100.4 F (38 C) OR HIGHER *CHILLS OR SWEATING *NAUSEA AND VOMITING THAT IS NOT CONTROLLED WITH YOUR NAUSEA MEDICATION *UNUSUAL SHORTNESS OF BREATH *UNUSUAL BRUISING OR BLEEDING *URINARY PROBLEMS (pain or burning when urinating, or frequent urination) *BOWEL PROBLEMS (unusual diarrhea, constipation, pain near the anus) TENDERNESS IN MOUTH AND THROAT WITH OR WITHOUT PRESENCE OF ULCERS (sore throat, sores in mouth, or a toothache) UNUSUAL RASH, SWELLING OR PAIN  UNUSUAL VAGINAL DISCHARGE OR ITCHING   Items with * indicate a potential emergency and should be followed up as soon as possible or go to the Emergency Department if any problems should occur.  Please show the CHEMOTHERAPY  ALERT CARD or IMMUNOTHERAPY ALERT CARD at check-in to the Emergency Department and triage nurse.  Should you have questions after your visit or need to cancel or reschedule your appointment, please contact [facility] - A DEPT OF [facility]  Dept: [phone]  and follow the prompts.  Office hours are 8:00 a.m. to 4:30 p.m. Monday - Friday. Please note that voicemails left after 4:00 p.m. may not be returned until the following business day.  We are closed weekends and major holidays. You have access to a nurse at all times for urgent questions. Please call the main number to the clinic Dept: [phone] and follow the prompts.   For any non-urgent questions, you may also contact your provider using MyChart. We now offer e-Visits for anyone 15 and older to request care online for non-urgent symptoms. For details visit [email or web address].   Also download the MyChart app! Go to the app store, search MyChart, open the app, select [place], and log in with your MyChart username and password.

## 2024-11-03 ENCOUNTER — Ambulatory Visit

## 2024-11-03 ENCOUNTER — Telehealth: Payer: Self-pay

## 2024-11-03 DIAGNOSIS — C50412 Malignant neoplasm of upper-outer quadrant of left female breast: Secondary | ICD-10-CM

## 2024-11-03 DIAGNOSIS — M6281 Muscle weakness (generalized): Secondary | ICD-10-CM | POA: Diagnosis not present

## 2024-11-03 DIAGNOSIS — Z483 Aftercare following surgery for neoplasm: Secondary | ICD-10-CM

## 2024-11-03 DIAGNOSIS — Z79899 Other long term (current) drug therapy: Secondary | ICD-10-CM

## 2024-11-03 DIAGNOSIS — R293 Abnormal posture: Secondary | ICD-10-CM

## 2024-11-03 NOTE — Telephone Encounter (Signed)
-----   Message from [name], MD sent at 11/02/2024  9:21 AM EST ----- Hi,  Can we order a follow up echo on this patient in 3 months for follow up of chemotherapy.  Evaluate LV function.  High risk med.  Please let her know .  Thanks.

## 2024-11-03 NOTE — Telephone Encounter (Signed)
 Spoke with pt regarding an echo in 3 months. Pt agreeable to plan. Echo ordered. Pt verbalized understanding. All question if any were answered.

## 2024-11-03 NOTE — Therapy (Addendum)
 " OUTPATIENT PHYSICAL THERAPY BREAST CANCER POST OP FOLLOW UP   Patient Name: [name] MRN: [id number] DOB:Nov 04, 1946, 78 y.o., female Today's Date: 11/03/2024  END OF SESSION:  PT End of Session - 11/03/24 1125     Visit Number 7    Number of Visits 10    Date for Recertification  11/08/24    PT Start Time 1108    PT Stop Time 1203    PT Time Calculation (min) 55 min    Activity Tolerance Patient tolerated treatment well    Behavior During Therapy [facility] for tasks assessed/performed             Past Medical History:  Diagnosis Date   Arthritis    OA   Atrial flutter (HCC)    a. s/p TEE/DCCV 08/16/13 (normal LV function, no LAA thrombus).b. s/p ablation by Dr Kelley 09-23-2013   Coronary artery disease    2009 LAD Promus stent   Dysrhythmia    Aflutter, no issues after ablation 2014   Fibroid    Hyperlipidemia    Hypertension    Insulin  pump in place    Osteopenia    Type 1 diabetes mellitus on insulin  therapy (HCC)    Valvular heart disease    a. Mild MR/TR by TEE 08/2013.   Past Surgical History:  Procedure Laterality Date   ABLATION  09/23/2013   CTI by Dr Kelley   APPENDECTOMY     APPLICATION OF WOUND VAC Left 04/11/2017   Procedure: APPLICATION OF WOUND VAC LEFT KNEE;  Surgeon: Audrey Rogue, MD;  Location: [facility] OR;  Service: Orthopedics;  Laterality: Left;   ATRIAL FLUTTER ABLATION N/A 09/23/2013   Procedure: ATRIAL FLUTTER ABLATION;  Surgeon: Audrey LELON Waddell, MD;  Location: [facility] CATH LAB;  Service: Cardiovascular;  Laterality: N/A;   CARDIOVERSION N/A 08/16/2013   Procedure: CARDIOVERSION;  Surgeon: Audrey JINNY Passe, MD;  Location: [facility] ENDOSCOPY;  Service: Cardiovascular;  Laterality: N/A;  spoke with [name]   CATARACT EXTRACTION Bilateral    COLONOSCOPY     CORONARY ANGIOPLASTY WITH STENT PLACEMENT     EYE SURGERY     Cataract   LASER ABLATION Right 11/20/2023   Right leg laser ablation of the Greater saphenous vein with >20 stab phlebectomies    MYOMECTOMY  1977   ORIF PATELLA Left 04/11/2017   ORIF PATELLA Left 04/11/2017   Procedure: OPEN REDUCTION INTERNAL (ORIF) FIXATION PATELLA LEFT KNEE;  Surgeon: Audrey Rogue, MD;  Location: [facility] OR;  Service: Orthopedics;  Laterality: Left;   PELVIC LAPAROSCOPY     PORTACATH PLACEMENT Right 07/27/2024   Procedure: INSERTION OF TUNNELED CENTRAL VENOUS DEVICE WITH PORT;  Surgeon: Audrey Berg, MD;  Location: [facility];  Service: General;  Laterality: Right;  PORT-A-CATH INSERTION WITH ULTRASOUND GUIDANCE   SENTINEL NODE BIOPSY Left 07/27/2024   Procedure: LEFT SENTINEL LYMPH NODE BIOPSY;  Surgeon: Audrey Berg, MD;  Location: [facility];  Service: General;  Laterality: Left;  SENTINEL NODE BIOPSY   TEE WITHOUT CARDIOVERSION N/A 08/16/2013   Procedure: TRANSESOPHAGEAL ECHOCARDIOGRAM (TEE);  Surgeon: Audrey JINNY Passe, MD;  Location: [facility] ENDOSCOPY;  Service: Cardiovascular;  Laterality: N/A;   TUBAL LIGATION  1988   Patient Active Problem List   Diagnosis Date Noted   Atrial flutter (HCC) 07/05/2024   Malignant neoplasm of upper-outer quadrant of left breast in female, estrogen receptor positive (HCC) 06/28/2024   Acute lateral meniscal tear, right, initial encounter 09/02/2023   Varicose veins of both  lower extremities with pain 07/04/2023   Arthritis of carpometacarpal [facility]) joint of both thumbs 01/17/2022   Bilateral hearing loss 01/08/2022   Hypertension 05/13/2019   Urinary incontinence 01/19/2019   Valvular heart disease    Insulin  pump in place    Fibroid    Osteoarthritis    Mixed diabetic hyperlipidemia associated with type 1 diabetes mellitus (HCC) 03/16/2015   Type 1 diabetes mellitus with complication (HCC) 05/12/2013   Chronic insomnia 03/25/2012   Osteopenia    Atherosclerosis of native coronary artery of native heart with angina pectoris 04/05/2011    REFERRING PROVIDER: Dr. Vicenta Poli  REFERRING DIAG: Left breast  cancer  THERAPY DIAG:  Muscle weakness (generalized)  Aftercare following surgery for neoplasm  Abnormal posture  Malignant neoplasm of upper-outer quadrant of left breast in female, estrogen receptor positive (HCC)  Rationale for Evaluation and Treatment: Rehabilitation  ONSET DATE: 07/27/2024  SUBJECTIVE:                                                                                                                                                                                           SUBJECTIVE STATEMENT:  Did well after last visit. 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 She underwent a left lumpectomy and sentinel node biopsy (4 negative nodes) on 07/27/2024. It is triple positive with a Ki67 of 3%.   PATIENT GOALS:  Reassess how my recovery is going related to arm function, pain, and swelling.  PAIN:  Are you having pain? Yes: NPRS scale: 2/10 Pain location: R shoulder Pain description: sore, hurts to touch, feels like it has a catch Aggravating factors: getting dressed, moving it Relieving factors: advil  PRECAUTIONS: Recent Surgery, left UE Lymphedema risk  RED FLAGS: None   ACTIVITY LEVEL / LEISURE: walked the dogs a couple of block on Wednesday, has not been going to the fitness center lately due to fatigue   OBJECTIVE:   PATIENT SURVEYS:  QUICK DASH:     OBSERVATIONS: Healing lumpectomy scar but increased scar tissue palpable  POSTURE:  Increased kyphosis, forward head  LYMPHEDEMA ASSESSMENT:   UPPER EXTREMITY AROM/PROM:   A/PROM RIGHT   eval    Shoulder extension 50  Shoulder flexion 147  Shoulder abduction 151  Shoulder internal rotation 72  Shoulder external rotation 43                          (Blank rows = not tested)  A/PROM LEFT   eval LEFT 10/11/2024  Shoulder extension 51 74   Shoulder flexion 135 162  Shoulder abduction 157 160  Shoulder internal rotation 57 65  Shoulder external rotation 79 78                          (Blank rows = not tested)   CERVICAL AROM: All within normal limits:      Percent limited  Flexion WNL  Extension WFL  Right lateral flexion 50% limited  Left lateral flexion 75% limited  Right rotation 50% limited  Left rotation           50% limited      UPPER EXTREMITY STRENGTH: WFL   LYMPHEDEMA ASSESSMENTS (in cm):    LANDMARK RIGHT   eval RIGHT 10/11/2024  10 cm proximal to olecranon process 27.7 26.1  Olecranon process 26.5 25.5  10 cm proximal to ulnar styloid process 22 20  Just proximal to ulnar styloid process 17.4 16.7  Across hand at thumb web space 18.8 19.5  At base of 2nd digit 6.9 6.7  (Blank rows = not tested)   LANDMARK LEFT   eval LEFT 10/11/2024  10 cm proximal to olecranon process 27.2 25.7  Olecranon process 26.1 25  10  cm proximal to ulnar styloid process 21.9 20.7  Just proximal to ulnar styloid process 17.7 16.9  Across hand at thumb web space 19.4 19.6  At base of 2nd digit 6.8 6.6  (Blank rows = not tested)  LOWER EXTREMITY MMT:  MMT Right eval  Hip flexion 3+  Hip extension 3  Hip abduction 4+  Hip adduction   Hip internal rotation   Hip external rotation   Knee flexion 5  Knee extension 5  Ankle dorsiflexion 5  Ankle plantarflexion   Ankle inversion   Ankle eversion    (Blank rows = not tested)  MMT LEFT eval  Hip flexion 3+  Hip extension 3  Hip abduction 4+  Hip adduction   Hip internal rotation   Hip external rotation   Knee flexion 4  Knee extension 5  Ankle dorsiflexion 4+  Ankle plantarflexion   Ankle inversion   Ankle eversion    (Blank rows = not tested)  Surgery type/Date: 07/27/2024 left lumpectomy and sentinel node biopsy Number of lymph nodes removed: 4 Current/past treatment (chemo, radiation, hormone therapy): chemotherapy Other symptoms:   Heaviness/tightness No Pain No Pitting edema No Infections No Decreased scar mobility Yes Stemmer sign No  30 SEC SIT TO STAND: 10 reps which is below average for her age  SINGLE LIMB STANCE: R - 5 seconds, L 8 seconds  GAIT: Shortened step length bilaterally, foot flat, unsteady at time but no losses of balance  TANDEM STANCE: Tandem with R foot in from 3 sec, with L foot in front 8 seconds  TREATMENT PERFORMED:  11/03/2024 NuStep level 4, seat at 10, arms at 9 x 10 min - 952 steps  Dual cable machine: 3 lbs for scap retraction x 10 reps with pt returning therapist demo Leg press seat at 7, 70 lbs 1 x 10, 80 lbs- 1 sets of 10 reps, 85 lbs 1 x 10 reps Bilateral HS stretch seated 3 x 20 sec 3 way hip machine: 25 lbs in flexion, abduction and extension x 10 each bilaterally with pt returning therapist demo Ball squeeze 5 sec hold x 10 Tandem stance in// bars; 2 B, 30 sec with intermittent HH  And VC's for posture and core stab  11/01/24:  Therapeutic Exercise: Seated hamstring stretch x 30 sec holds x 2 reps bilaterally with hands on hips to keep from rounded back NuStep level 4, seat at 10, arms at 9 x 10 min - steps 835 3 way hip machine: 25 lbs in flexion, abduction and extension x 10 each bilaterally with pt returning therapist demo Leg press seat at 7, 70 lbs- 2 sets of 10 reps Dual cable machine: 3 lbs for scap retraction x 10 reps with pt returning therapist demo Therapeutic Activity: Bridging x 10 reps with 5 sec holds with v/c to engage core throughout and not just push up with glutes Pelvic tilts x 10 reps with 5 sec holds with pt demonstrating good form Seated on red disc for core engagement: TKE with 2lb ankle weights with 3 sec holds x 10 reps bilaterally  Seated hip flexion with 2 lb ankle weights x 10 with v/c to not lean back Ball squeezes with 5 sec holds x 10   10/21/24:  Therapeutic Exercise: Seated hamstring stretch x 30 sec holds x 2 reps bilaterally  with hands on hips to keep from rounded back NuStep level 4, seat at 10, arms at 9 (no arms half way through due to hx of SLNB) x 10 min - steps 940 Therapeutic Activity: Bridging x 12 reps with 5 sec holds with pt demonstrating good form today and no hamstring cramping Pelvic tilts x 10 reps with 5 sec holds with pt demonstrating good form Scapular retraction with v/c to pull back and down x 10 reps with yellow band and cues for upright posture and keeping elbows close Seated on red disc for core engagement: TKE with 2lb ankle weights with 3 sec holds x 10 reps bilaterally  Seated hip flexion with 2 lb ankle weights x 10 with v/c to not lean back Ball squeezes with 5 sec holds x 13  Hip abduction x 10 reps with green theraband Neuro reed: Standing on air ex with occasional HHA for balance: hip flexion x 10 reps bilaterally then hip extension x 10 bilaterally with more difficulty with balance, hip abduction x 10 bilaterally  Tandem walking x 4 on air ex beam with occasional hand held assist  10/19/24:  Therapeutic Exercise: Seated hamstring stretch x 30 sec holds x 2 reps bilaterally with v/c to keep from rounding back TKE with 2lb ankle weights with 3 sec holds x 10 reps bilaterally  Seated hip flexion with 2 lb ankle weights x 10 with v/c to not lean back NuStep level 4, seat at 10, arms at 9 (no arms half way through due to hx of SLNB) x 10 min - steps 870 Therapeutic Activity: Bridging x 10 reps with 5 sec holds with pt demonstrating good form today and no hamstring cramping Pelvic tilts x 10 reps with 5 sec holds with pt demonstrating good form Scapular retraction with v/c to pull back and down x 10 reps with yellow band and cues for upright posture and keeping elbows close Neuro reed: Standing on air ex with 1 HHA: hip flexion x 10 reps bilaterally then hip extension with v/c to keep knee straight with pt having difficulty clearing mat due to inability to hip hike  10/12/24:   Therapeutic Exercise: Seated hamstring stretch x 30 sec holds x 2 reps bilaterally with v/c to keep from rounding back TKE with 2lb ankle weights with 3 sec holds x 10 reps bilaterally  Seated hip  flexion with 2 lb ankle weights x 10 with v/c to not lean back NuStep level 3, seat at 10, arms at 9 (using arms 2 min on/2 min off due to hx of SLNB) x 10 min Therapeutic Activity: Bridging x 10 reps with v/c and t/c to use the core and engage core throughout to avoid hamstring cramping Pelvic tilts x 10 reps with 5 sec holds with v/c and t/c for proper form Scapular retraction with v/c to pull back and down x 10 reps with red band   PATIENT EDUCATION:  Education details: why pec muscle is sore with new exericses Person educated: Patient Education method: Explanation Education comprehension: verbalized understanding  HOME EXERCISE PROGRAM: Reviewed previously given post op HEP. Practice sit to stands from chair without use of UEs  Access Code: [email or web address] URL: [email or web address] Date: 10/12/2024 Prepared by: Florina Lanis Carbon  Exercises - Seated Hamstring Stretch  - 1 x daily - 7 x weekly - 1 sets - 2 reps - 30 sec hold - Seated Hip Flexion March with Ankle Weights  - 1 x daily - 7 x weekly - 1 sets - 10 reps - Seated Long Arc Quad with Ankle Weight  - 1 x daily - 7 x weekly - 1 sets - 10 reps - 3 sec hold - Supine Bridge  - 1 x daily - 7 x weekly - 1 sets - 10 reps - Supine Posterior Pelvic Tilt  - 1 x daily - 7 x weekly - 1-3 sets - 10 reps - 5 sec hold - Scapular Retraction with Resistance  - 1 x daily - 7 x weekly - 1 sets - 10 reps   ASSESSMENT:  CLINICAL IMPRESSION:  Pt had chemo yesterday but is feeling very good today. Pt worked hard, but caution was used not to overdue secondary to recent chemo. She was able to increase her steps on the Nu step and increased wt by 15 lbs on the leg press. She demonstrates difficulty with balance and will need to continue  to focus on this..   Pt will benefit from skilled therapeutic intervention to improve on the following deficits: Decreased knowledge of precautions, impaired UE functional use, pain, decreased ROM, postural dysfunction.   PT treatment/interventions: ADL/Self care home management, [phone]- PT Re-evaluation, 97110-Therapeutic exercises, 97530- Therapeutic activity, [id number]- Neuromuscular re-education, 97535- Self Care, [id number]- Manual therapy, [phone]- Gait training, and Patient/Family education   GOALS: Goals reviewed with patient? Yes  GOALS MET AT EVAL:  GOALS Name Target Date Goal status  1 Pt will be able to verbalize understanding of pertinent lymphedema risk reduction practices relevant to her dx specifically related to skin care.  Baseline:  No knowledge Eval Achieved at eval  2 Pt will be able to return demo and/or verbalize understanding of the post op HEP related to regaining shoulder ROM. Baseline:  No knowledge Eval Achieved at eval  3 Pt will be able to verbalize understanding of the importance of viewing the post op After Breast CA Class video for further lymphedema risk reduction education and therapeutic exercise.  Baseline:  No knowledge Eval Achieved at eval   LONG TERM GOALS:  (STG=LTG)  GOALS Name Target Date  Goal status  1 Pt will demonstrate she has regained full shoulder ROM and function post operatively compared to baselines.  Baseline: 08/25/2024 INITIAL  2 Pt will demonstrate at least 4/5 bilateral hip flexion strength to decrease fall risk. 11/08/24 INITIAL  3 Pt will be able to complete SLS bilaterally  for at least 15 sec to decrease fall risk. 11/08/24 INITIAL  4 Pt will be able to stand in tandem stance for 15 sec with no losses of balance to decrease fall risk. 11/08/24 INITIAL  5 Pt will be independent in a home exercise program for continued stretching and strengthening.  11/08/24 INITIAL     PLAN:  PT FREQUENCY/DURATION: 2x/wk for 4 weeks  PLAN FOR NEXT SESSION:  how long did soreness last?, NuStep, hip strengthening, balance exercises, air ex beam, hamstring stretching, posture exercises, give post op handout   [facility]  [address]  [place] [address]  [phone]  After Breast Cancer Class Video It is recommended you view the ABC class video to be educated on lymphedema risk reduction. This video lasts for about 30 minutes. It can be viewed on our website here: [email or web address]  Scar massage You can begin gentle scar massage to you incision sites. Gently place one hand on the incision and move the skin (without sliding on the skin) in various directions. Do this for a few minutes and then you can gently massage either coconut oil or vitamin E cream into the scars.  Compression garment You should continue wearing your compression bra until you feel like you no longer have swelling.  Home exercise Program Continue doing the exercises you were given until you feel like you can do them without feeling any tightness at the end.   Walking Program Studies show that 30 minutes of walking per day (fast enough to elevate your heart rate) can significantly reduce the risk of a cancer recurrence. If you can't walk due to other medical reasons, we encourage you to find another activity you could do (like a stationary bike or water exercise).  Posture After breast cancer surgery, people frequently sit with rounded shoulders posture because it puts their incisions on slack and feels better. If you sit like this and scar tissue forms in that position, you can become very tight and have pain sitting or standing with good posture. Try to be aware of your posture and sit and stand up tall to heal properly.  Follow up PT: It is recommended you return every 3 months for the first 3 years following surgery to be assessed on the SOZO machine for an L-Dex  score. This helps prevent clinically significant lymphedema in 95% of patients. These follow up screens are 10 minute appointments that you are not billed for.  [name], PT 11/03/2024, 1:22 PM  "

## 2024-11-05 ENCOUNTER — Inpatient Hospital Stay: Attending: Hematology

## 2024-11-05 ENCOUNTER — Inpatient Hospital Stay

## 2024-11-05 VITALS — BP 175/84 | HR 102 | Temp 99.3°F | Resp 18

## 2024-11-05 DIAGNOSIS — M1990 Unspecified osteoarthritis, unspecified site: Secondary | ICD-10-CM | POA: Insufficient documentation

## 2024-11-05 DIAGNOSIS — Z7984 Long term (current) use of oral hypoglycemic drugs: Secondary | ICD-10-CM | POA: Diagnosis not present

## 2024-11-05 DIAGNOSIS — Z79899 Other long term (current) drug therapy: Secondary | ICD-10-CM | POA: Diagnosis not present

## 2024-11-05 DIAGNOSIS — Z794 Long term (current) use of insulin: Secondary | ICD-10-CM | POA: Insufficient documentation

## 2024-11-05 DIAGNOSIS — Z9641 Presence of insulin pump (external) (internal): Secondary | ICD-10-CM | POA: Insufficient documentation

## 2024-11-05 DIAGNOSIS — C50412 Malignant neoplasm of upper-outer quadrant of left female breast: Secondary | ICD-10-CM | POA: Insufficient documentation

## 2024-11-05 DIAGNOSIS — Z79633 Long term (current) use of mitotic inhibitor: Secondary | ICD-10-CM | POA: Diagnosis not present

## 2024-11-05 DIAGNOSIS — Z5111 Encounter for antineoplastic chemotherapy: Secondary | ICD-10-CM | POA: Insufficient documentation

## 2024-11-05 DIAGNOSIS — K5903 Drug induced constipation: Secondary | ICD-10-CM | POA: Diagnosis not present

## 2024-11-05 DIAGNOSIS — R197 Diarrhea, unspecified: Secondary | ICD-10-CM | POA: Insufficient documentation

## 2024-11-05 DIAGNOSIS — Z7952 Long term (current) use of systemic steroids: Secondary | ICD-10-CM | POA: Insufficient documentation

## 2024-11-05 DIAGNOSIS — E109 Type 1 diabetes mellitus without complications: Secondary | ICD-10-CM | POA: Insufficient documentation

## 2024-11-05 DIAGNOSIS — Z79630 Long term (current) use of alkylating agent: Secondary | ICD-10-CM | POA: Diagnosis not present

## 2024-11-05 DIAGNOSIS — Z17410 Hormone receptor positive with human epidermal growth factor receptor 2 positive status: Secondary | ICD-10-CM | POA: Diagnosis not present

## 2024-11-05 DIAGNOSIS — I2510 Atherosclerotic heart disease of native coronary artery without angina pectoris: Secondary | ICD-10-CM | POA: Diagnosis not present

## 2024-11-05 DIAGNOSIS — L299 Pruritus, unspecified: Secondary | ICD-10-CM | POA: Diagnosis not present

## 2024-11-05 DIAGNOSIS — E785 Hyperlipidemia, unspecified: Secondary | ICD-10-CM | POA: Diagnosis not present

## 2024-11-05 DIAGNOSIS — Z170 Estrogen receptor positive status [ER+]: Secondary | ICD-10-CM

## 2024-11-05 DIAGNOSIS — I10 Essential (primary) hypertension: Secondary | ICD-10-CM | POA: Insufficient documentation

## 2024-11-05 DIAGNOSIS — R5383 Other fatigue: Secondary | ICD-10-CM | POA: Insufficient documentation

## 2024-11-05 DIAGNOSIS — T451X5A Adverse effect of antineoplastic and immunosuppressive drugs, initial encounter: Secondary | ICD-10-CM | POA: Insufficient documentation

## 2024-11-05 DIAGNOSIS — M8580 Other specified disorders of bone density and structure, unspecified site: Secondary | ICD-10-CM | POA: Diagnosis not present

## 2024-11-05 MED ORDER — PEGFILGRASTIM-FPGK 6 MG/0.6ML ~~LOC~~ SOSY
6.0000 mg | PREFILLED_SYRINGE | Freq: Once | SUBCUTANEOUS | Status: AC
  Administered 2024-11-05: 6 mg via SUBCUTANEOUS
  Filled 2024-11-05: qty 0.6

## 2024-11-08 ENCOUNTER — Ambulatory Visit

## 2024-11-09 ENCOUNTER — Ambulatory Visit: Admitting: Endocrinology

## 2024-11-10 ENCOUNTER — Ambulatory Visit: Attending: Surgery | Admitting: Physical Therapy

## 2024-11-10 ENCOUNTER — Encounter: Payer: Self-pay | Admitting: Physical Therapy

## 2024-11-10 DIAGNOSIS — C50412 Malignant neoplasm of upper-outer quadrant of left female breast: Secondary | ICD-10-CM | POA: Insufficient documentation

## 2024-11-10 DIAGNOSIS — Z170 Estrogen receptor positive status [ER+]: Secondary | ICD-10-CM | POA: Insufficient documentation

## 2024-11-10 DIAGNOSIS — M6281 Muscle weakness (generalized): Secondary | ICD-10-CM | POA: Diagnosis present

## 2024-11-10 DIAGNOSIS — R293 Abnormal posture: Secondary | ICD-10-CM | POA: Insufficient documentation

## 2024-11-10 DIAGNOSIS — Z483 Aftercare following surgery for neoplasm: Secondary | ICD-10-CM | POA: Insufficient documentation

## 2024-11-10 NOTE — Therapy (Signed)
 " OUTPATIENT PHYSICAL THERAPY BREAST CANCER POST OP FOLLOW UP   Patient Name: [name] MRN: [id number] DOB:01-28-1946, 79 y.o., female Today's Date: 11/10/2024  END OF SESSION:  PT End of Session - 11/10/24 1007     Visit Number 8    Number of Visits 16    Date for Recertification  12/08/24    PT Start Time 1004    PT Stop Time 1048    PT Time Calculation (min) 44 min    Activity Tolerance Patient tolerated treatment well;Patient limited by fatigue    Behavior During Therapy [facility] for tasks assessed/performed              Past Medical History:  Diagnosis Date   Arthritis    OA   Atrial flutter (HCC)    a. s/p TEE/DCCV 08/16/13 (normal LV function, no LAA thrombus).b. s/p ablation by Dr Waddell 09-23-2013   Coronary artery disease    2009 LAD Promus stent   Dysrhythmia    Aflutter, no issues after ablation 2014   Fibroid    Hyperlipidemia    Hypertension    Insulin  pump in place    Osteopenia    Type 1 diabetes mellitus on insulin  therapy (HCC)    Valvular heart disease    a. Mild MR/TR by TEE 08/2013.   Past Surgical History:  Procedure Laterality Date   ABLATION  09/23/2013   CTI by Dr Waddell   APPENDECTOMY     APPLICATION OF WOUND VAC Left 04/11/2017   Procedure: APPLICATION OF WOUND VAC LEFT KNEE;  Surgeon: Fidel Rogue, MD;  Location: [facility] OR;  Service: Orthopedics;  Laterality: Left;   ATRIAL FLUTTER ABLATION N/A 09/23/2013   Procedure: ATRIAL FLUTTER ABLATION;  Surgeon: Danelle LELON Waddell, MD;  Location: [facility] CATH LAB;  Service: Cardiovascular;  Laterality: N/A;   CARDIOVERSION N/A 08/16/2013   Procedure: CARDIOVERSION;  Surgeon: Aleene JINNY Passe, MD;  Location: [facility] ENDOSCOPY;  Service: Cardiovascular;  Laterality: N/A;  spoke with Tom   CATARACT EXTRACTION Bilateral    COLONOSCOPY     CORONARY ANGIOPLASTY WITH STENT PLACEMENT     EYE SURGERY     Cataract   LASER ABLATION Right 11/20/2023   Right leg laser ablation of the Greater saphenous vein with >20  stab phlebectomies   MYOMECTOMY  1977   ORIF PATELLA Left 04/11/2017   ORIF PATELLA Left 04/11/2017   Procedure: OPEN REDUCTION INTERNAL (ORIF) FIXATION PATELLA LEFT KNEE;  Surgeon: Fidel Rogue, MD;  Location: [facility] OR;  Service: Orthopedics;  Laterality: Left;   PELVIC LAPAROSCOPY     PORTACATH PLACEMENT Right 07/27/2024   Procedure: INSERTION OF TUNNELED CENTRAL VENOUS DEVICE WITH PORT;  Surgeon: Vernetta Berg, MD;  Location: [facility];  Service: General;  Laterality: Right;  PORT-A-CATH INSERTION WITH ULTRASOUND GUIDANCE   SENTINEL NODE BIOPSY Left 07/27/2024   Procedure: LEFT SENTINEL LYMPH NODE BIOPSY;  Surgeon: Vernetta Berg, MD;  Location: [facility];  Service: General;  Laterality: Left;  SENTINEL NODE BIOPSY   TEE WITHOUT CARDIOVERSION N/A 08/16/2013   Procedure: TRANSESOPHAGEAL ECHOCARDIOGRAM (TEE);  Surgeon: Aleene JINNY Passe, MD;  Location: [facility] ENDOSCOPY;  Service: Cardiovascular;  Laterality: N/A;   TUBAL LIGATION  1988   Patient Active Problem List   Diagnosis Date Noted   Atrial flutter (HCC) 07/05/2024   Malignant neoplasm of upper-outer quadrant of left breast in female, estrogen receptor positive (HCC) 06/28/2024   Acute lateral meniscal tear, right, initial encounter 09/02/2023  Varicose veins of both lower extremities with pain 07/04/2023   Arthritis of carpometacarpal [facility]) joint of both thumbs 01/17/2022   Bilateral hearing loss 01/08/2022   Hypertension 05/13/2019   Urinary incontinence 01/19/2019   Valvular heart disease    Insulin  pump in place    Fibroid    Osteoarthritis    Mixed diabetic hyperlipidemia associated with type 1 diabetes mellitus (HCC) 03/16/2015   Type 1 diabetes mellitus with complication (HCC) 05/12/2013   Chronic insomnia 03/25/2012   Osteopenia    Atherosclerosis of native coronary artery of native heart with angina pectoris 04/05/2011    REFERRING PROVIDER: Dr. Vicenta Poli  REFERRING DIAG:  Left breast cancer  THERAPY DIAG:  Muscle weakness (generalized) - Plan: PT plan of care cert/re-cert  Aftercare following surgery for neoplasm - Plan: PT plan of care cert/re-cert  Abnormal posture - Plan: PT plan of care cert/re-cert  Malignant neoplasm of upper-outer quadrant of left breast in female, estrogen receptor positive (HCC) - Plan: PT plan of care cert/re-cert  Rationale for Evaluation and Treatment: Rehabilitation  ONSET DATE: 07/27/2024  SUBJECTIVE:                                                                                                                                                                                           SUBJECTIVE STATEMENT: I was feeling really bad Friday morning after chemo. I could not walk to get the shot at the cancer center. I had to use the w/c. I slept for 3 hours after that. I have not been able to exercise. Next week will be my good week. I don't have chemo until Jan 20th.   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 She underwent a left lumpectomy and sentinel node biopsy (4 negative nodes) on 07/27/2024. It is triple positive with a Ki67 of 3%. She is currently undergoing chemotherapy. Pt is unsure if she will need radiation.    PATIENT GOALS:  Reassess how my recovery is going related to arm function, pain, and swelling.  PAIN:  Are you having pain? Yes: NPRS scale: 2/10 Pain location: R shoulder Pain description: sore, hurts to touch, feels like it has a catch Aggravating factors: getting dressed, moving it Relieving factors: advil  PRECAUTIONS: Recent Surgery, left UE Lymphedema risk  RED FLAGS: None   ACTIVITY LEVEL / LEISURE: walked the dogs a couple of block on Wednesday, has not been going to the fitness center lately due to fatigue   OBJECTIVE:   PATIENT SURVEYS:  QUICK DASH:     OBSERVATIONS: Healing lumpectomy scar but  increased scar  tissue palpable  POSTURE:  Increased kyphosis, forward head  LYMPHEDEMA ASSESSMENT:   UPPER EXTREMITY AROM/PROM:   A/PROM RIGHT   eval    Shoulder extension 50  Shoulder flexion 147  Shoulder abduction 151  Shoulder internal rotation 72  Shoulder external rotation 43                          (Blank rows = not tested)   A/PROM LEFT   eval LEFT 10/11/2024 LEFT 11/10/24  Shoulder extension 51 74 60  Shoulder flexion 135 162 160  Shoulder abduction 157 160 165  Shoulder internal rotation 57 65 65  Shoulder external rotation 79 78 72                          (Blank rows = not tested)   CERVICAL AROM: All within normal limits:      Percent limited  Flexion WNL  Extension WFL  Right lateral flexion 50% limited  Left lateral flexion 75% limited  Right rotation 50% limited  Left rotation           50% limited      UPPER EXTREMITY STRENGTH: WFL   LYMPHEDEMA ASSESSMENTS (in cm):    LANDMARK RIGHT   eval RIGHT 10/11/2024  10 cm proximal to olecranon process 27.7 26.1  Olecranon process 26.5 25.5  10 cm proximal to ulnar styloid process 22 20  Just proximal to ulnar styloid process 17.4 16.7  Across hand at thumb web space 18.8 19.5  At base of 2nd digit 6.9 6.7  (Blank rows = not tested)   LANDMARK LEFT   eval LEFT 10/11/2024  10 cm proximal to olecranon process 27.2 25.7  Olecranon process 26.1 25  10  cm proximal to ulnar styloid process 21.9 20.7  Just proximal to ulnar styloid process 17.7 16.9  Across hand at thumb web space 19.4 19.6  At base of 2nd digit 6.8 6.6  (Blank rows = not tested)  LOWER EXTREMITY MMT:  MMT Right eval RIGHT 11/10/24  Hip flexion 3+ 5/5  Hip extension 3   Hip abduction 4+   Hip adduction    Hip internal rotation    Hip external rotation    Knee flexion 5   Knee extension 5   Ankle dorsiflexion 5   Ankle plantarflexion    Ankle inversion    Ankle eversion     (Blank rows = not tested)  MMT LEFT eval LEFT 11/10/24  Hip  flexion 3+ 5/5  Hip extension 3   Hip abduction 4+   Hip adduction    Hip internal rotation    Hip external rotation    Knee flexion 4   Knee extension 5   Ankle dorsiflexion 4+   Ankle plantarflexion    Ankle inversion    Ankle eversion     (Blank rows = not tested)  Surgery type/Date: 07/27/2024 left lumpectomy and sentinel node biopsy Number of lymph nodes removed: 4 Current/past treatment (chemo, radiation, hormone therapy): chemotherapy Other symptoms:  Heaviness/tightness No Pain No Pitting edema No Infections No Decreased scar mobility Yes Stemmer sign No  30 SEC SIT TO STAND: 10 reps which is below average for her age  SINGLE LIMB STANCE:  11/10/24: R - 2 sec, L 6 sec  At eval: R - 5 seconds, L 8 seconds  GAIT: Shortened step length bilaterally, foot flat, unsteady at time but  no losses of balance  TANDEM STANCE:  11/11/23: 9 sec At eval:Tandem with R foot in from 3 sec, with L foot in front 8 seconds  TREATMENT PERFORMED: 11/10/24 Assessed progress towards all goals in therapy - assessed strength and balance NuStep level 4, seat at 10, arms at 9 x 10 min - 773 steps  Tandem walking on air ex foam x 4 with bilateral HHA with increased fatigue requiring seated recovery period afterwards  Attempted 3 way hip on purple foam but pt became very fatigued and short of breath after 10 reps of L hip flexion and required another seated recovery periods due to fatigue and shortness of breath Self care- discussed how chemo effects the body and how it is important to listen to the body such as dizziness and shortness of breath and take recovery periods as needed and not to push during the time right after chemo. Also educated pt how balance is effected by chemo.   11/03/2024 NuStep level 4, seat at 10, arms at 9 x 10 min - 952 steps  Dual cable machine: 3 lbs for scap retraction x 10 reps with pt returning therapist demo Leg press seat at 7, 70 lbs 1 x 580 lbs- 1 sets of 10 reps,  85 lbs 1 x 10 reps Bilateral HS stretch seated 3 x 20 sec 3 way hip machine: 25 lbs in flexion, abduction and extension x 10 each bilaterally with pt returning therapist demo Ball squeeze 5 sec hold x 10 Tandem stance in// bars; 2 B, 30 sec with intermittent HH  And VC's for posture and core stab  11/01/24:  Therapeutic Exercise: Seated hamstring stretch x 30 sec holds x 2 reps bilaterally with hands on hips to keep from rounded back NuStep level 4, seat at 10, arms at 9 x 10 min - steps 835 3 way hip machine: 25 lbs in flexion, abduction and extension x 10 each bilaterally with pt returning therapist demo Leg press seat at 7, 70 lbs- 2 sets of 10 reps Dual cable machine: 3 lbs for scap retraction x 10 reps with pt returning therapist demo Therapeutic Activity: Bridging x 10 reps with 5 sec holds with v/c to engage core throughout and not just push up with glutes Pelvic tilts x 10 reps with 5 sec holds with pt demonstrating good form Seated on red disc for core engagement: TKE with 2lb ankle weights with 3 sec holds x 10 reps bilaterally  Seated hip flexion with 2 lb ankle weights x 10 with v/c to not lean back Ball squeezes with 5 sec holds x 10   10/21/24:  Therapeutic Exercise: Seated hamstring stretch x 30 sec holds x 2 reps bilaterally with hands on hips to keep from rounded back NuStep level 4, seat at 10, arms at 9 (no arms half way through due to hx of SLNB) x 10 min - steps 940 Therapeutic Activity: Bridging x 12 reps with 5 sec holds with pt demonstrating good form today and no hamstring cramping Pelvic tilts x 10 reps with 5 sec holds with pt demonstrating good form Scapular retraction with v/c to pull back and down x 10 reps with yellow band and cues for upright posture and keeping elbows close Seated on red disc for core engagement: TKE with 2lb ankle weights with 3 sec holds x 10 reps bilaterally  Seated hip flexion with 2 lb ankle weights x 10 with v/c to not lean  back Ball squeezes with 5 sec holds  x 13  Hip abduction x 10 reps with green theraband Neuro reed: Standing on air ex with occasional HHA for balance: hip flexion x 10 reps bilaterally then hip extension x 10 bilaterally with more difficulty with balance, hip abduction x 10 bilaterally  Tandem walking x 4 on air ex beam with occasional hand held assist  10/19/24:  Therapeutic Exercise: Seated hamstring stretch x 30 sec holds x 2 reps bilaterally with v/c to keep from rounding back TKE with 2lb ankle weights with 3 sec holds x 10 reps bilaterally  Seated hip flexion with 2 lb ankle weights x 10 with v/c to not lean back NuStep level 4, seat at 10, arms at 9 (no arms half way through due to hx of SLNB) x 10 min - steps 870 Therapeutic Activity: Bridging x 10 reps with 5 sec holds with pt demonstrating good form today and no hamstring cramping Pelvic tilts x 10 reps with 5 sec holds with pt demonstrating good form Scapular retraction with v/c to pull back and down x 10 reps with yellow band and cues for upright posture and keeping elbows close Neuro reed: Standing on air ex with 1 HHA: hip flexion x 10 reps bilaterally then hip extension with v/c to keep knee straight with pt having difficulty clearing mat due to inability to hip hike  10/12/24:  Therapeutic Exercise: Seated hamstring stretch x 30 sec holds x 2 reps bilaterally with v/c to keep from rounding back TKE with 2lb ankle weights with 3 sec holds x 10 reps bilaterally  Seated hip flexion with 2 lb ankle weights x 10 with v/c to not lean back NuStep level 3, seat at 10, arms at 9 (using arms 2 min on/2 min off due to hx of SLNB) x 10 min Therapeutic Activity: Bridging x 10 reps with v/c and t/c to use the core and engage core throughout to avoid hamstring cramping Pelvic tilts x 10 reps with 5 sec holds with v/c and t/c for proper form Scapular retraction with v/c to pull back and down x 10 reps with red band   PATIENT EDUCATION:   Education details: how chemo effects the body including fatigue and balance and importance of not pushing through in the time right after chemo Person educated: Patient Education method: Explanation Education comprehension: verbalized understanding  HOME EXERCISE PROGRAM: Reviewed previously given post op HEP. Practice sit to stands from chair without use of UEs  Access Code: [email or web address] URL: [email or web address] Date: 10/12/2024 Prepared by: Florina Lanis Carbon  Exercises - Seated Hamstring Stretch  - 1 x daily - 7 x weekly - 1 sets - 2 reps - 30 sec hold - Seated Hip Flexion March with Ankle Weights  - 1 x daily - 7 x weekly - 1 sets - 10 reps - Seated Long Arc Quad with Ankle Weight  - 1 x daily - 7 x weekly - 1 sets - 10 reps - 3 sec hold - Supine Bridge  - 1 x daily - 7 x weekly - 1 sets - 10 reps - Supine Posterior Pelvic Tilt  - 1 x daily - 7 x weekly - 1-3 sets - 10 reps - 5 sec hold - Scapular Retraction with Resistance  - 1 x daily - 7 x weekly - 1 sets - 10 reps   ASSESSMENT:  CLINICAL IMPRESSION: Assessed pt's progress towards goals in therapy. She has met her hip flexion strength goal as well as her shoulder ROM. She had chemo last  week and has been having a lot of side effects. She has had a lot more fatigue to the point she had to use a w/c at the cancer center. She has had diarrhea recently but has been drinking lots of fluids. Her balance has declined some in her SLS test but improved a bit in tandem stance. She would benefit from continued skilled PT services to continue to improve balance, strength and progress pt toward independence with a HEP to continue strengthening at home to help decrease fatigue.   Pt will benefit from skilled therapeutic intervention to improve on the following deficits: Decreased knowledge of precautions, impaired UE functional use, pain, decreased ROM, postural dysfunction.   PT treatment/interventions: ADL/Self care home  management, [phone]- PT Re-evaluation, 97110-Therapeutic exercises, 97530- Therapeutic activity, [id number]- Neuromuscular re-education, 97535- Self Care, [id number]- Manual therapy, [phone]- Gait training, and Patient/Family education   GOALS: Goals reviewed with patient? Yes  GOALS MET AT EVAL:  GOALS Name Target Date Goal status  1 Pt will be able to verbalize understanding of pertinent lymphedema risk reduction practices relevant to her dx specifically related to skin care.  Baseline:  No knowledge Eval Achieved at eval  2 Pt will be able to return demo and/or verbalize understanding of the post op HEP related to regaining shoulder ROM. Baseline:  No knowledge Eval Achieved at eval  3 Pt will be able to verbalize understanding of the importance of viewing the post op After Breast CA Class video for further lymphedema risk reduction education and therapeutic exercise.  Baseline:  No knowledge Eval Achieved at eval   LONG TERM GOALS:  (STG=LTG)  GOALS Name Target Date  Goal status  1 Pt will demonstrate she has regained full shoulder ROM and function post operatively compared to baselines.  Baseline: 08/25/2024 MET 11/10/24  2 Pt will demonstrate at least 4/5 bilateral hip flexion strength to decrease fall risk. 11/08/24 MET 11/10/24  3 Pt will be able to complete SLS bilaterally for at least 15 sec to decrease fall risk. 11/08/24 ONGOING 11/10/24  4 Pt will be able to stand in tandem stance for 15 sec with no losses of balance to decrease fall risk. 11/08/24 ONGOING 11/10/24  5 Pt will be independent in a home exercise program for continued stretching and strengthening.  11/08/24 ONGOING 11/10/24     PLAN:  PT FREQUENCY/DURATION: 2x/wk for 4 weeks  PLAN FOR NEXT SESSION: how are effects from chemo?  NuStep, hip strengthening, balance exercises, air ex beam, hamstring stretching, posture exercises, give post op handout   [facility]  [address]  [place] [address]  [phone]  After Breast Cancer Class Video It is recommended you view the ABC class video to be educated on lymphedema risk reduction. This video lasts for about 30 minutes. It can be viewed on our website here: [email or web address]  Scar massage You can begin gentle scar massage to you incision sites. Gently place one hand on the incision and move the skin (without sliding on the skin) in various directions. Do this for a few minutes and then you can gently massage either coconut oil or vitamin E cream into the scars.  Compression garment You should continue wearing your compression bra until you feel like you no longer have swelling.  Home exercise Program Continue doing the exercises you were given until you feel like you can do them without feeling any tightness at the end.   Walking Program Studies show that 30  minutes of walking per day (fast enough to elevate your heart rate) can significantly reduce the risk of a cancer recurrence. If you can't walk due to other medical reasons, we encourage you to find another activity you could do (like a stationary bike or water exercise).  Posture After breast cancer surgery, people frequently sit with rounded shoulders posture because it puts their incisions on slack and feels better. If you sit like this and scar tissue forms in that position, you can become very tight and have pain sitting or standing with good posture. Try to be aware of your posture and sit and stand up tall to heal properly.  Follow up PT: It is recommended you return every 3 months for the first 3 years following surgery to be assessed on the SOZO machine for an L-Dex score. This helps prevent clinically significant lymphedema in 95% of patients. These follow up screens are 10 minute appointments that you are not billed for.  [facility], PT 11/10/2024, 11:01 AM  "

## 2024-11-11 ENCOUNTER — Ambulatory Visit: Payer: Self-pay | Admitting: Endocrinology

## 2024-11-11 ENCOUNTER — Other Ambulatory Visit: Payer: Self-pay | Admitting: Endocrinology

## 2024-11-11 ENCOUNTER — Ambulatory Visit (INDEPENDENT_AMBULATORY_CARE_PROVIDER_SITE_OTHER): Admitting: Endocrinology

## 2024-11-11 ENCOUNTER — Encounter: Payer: Self-pay | Admitting: Endocrinology

## 2024-11-11 VITALS — BP 122/80 | HR 93 | Resp 16 | Ht 69.0 in | Wt 176.2 lb

## 2024-11-11 DIAGNOSIS — E109 Type 1 diabetes mellitus without complications: Secondary | ICD-10-CM | POA: Diagnosis not present

## 2024-11-11 LAB — POCT GLYCOSYLATED HEMOGLOBIN (HGB A1C): Hemoglobin A1C: 5.8 % — AB (ref 4.0–5.6)

## 2024-11-11 NOTE — Progress Notes (Signed)
 "  Outpatient Endocrinology Note Audrey Bialy, MD  11/11/2024  Patient's Name: [name]    DOB: 1946-04-06    MRN: [id number]                                                    REASON OF VISIT: Follow up for type 1 diabetes mellitus  PCP: Rollene Almarie LABOR, MD  HISTORY OF PRESENT ILLNESS:   [name] is a 79 y.o. old female with past medical history listed below, is here for follow up of type 1 diabetes mellitus.   Pertinent Diabetes History: Patient was diagnosed with type 1 diabetes mellitus in 1979.  She has been on t:slim tandem insulin  pump.  She has also been on metformin .  She used to be on pioglitazone  in the past which had decreased requirement of insulin  dose.  She has been on t:slim tandem insulin  pump since October 2021.  Chronic Diabetes Complications : Retinopathy: no. Last ophthalmology exam was done on annually, 12/2023. Reviewed office note scAnned in media,  Nephropathy: no, on losartan . Peripheral neuropathy: no Coronary artery disease: no Stroke: no  Relevant comorbidities and cardiovascular risk factors: Obesity: yes Body mass index is 26.02 kg/m.  Hypertension: yes Hyperlipidemia. yes  Current / Home Diabetic regimen includes:  T:slim insulin  pump with Dexcom G7 uses Lyumjev  U100  Insulin  Pump setting:  Basal ( 36.250) MN- 0.5u/hour 3:30AM-1.5  5AM- 1.4 6AM- 2.2 10AM- 0.750 11AM- 0.750 1PM-   1.6 3:30PM-1.9 4:30PM-1.8 8PM-     2.0 10PM-   1.8  Bolus CHO Ratio (1unit:CHO) MN- 1:8 3:30AM-1:8 5AM- 1:8 6AM- 1:5 10AM- 1:5 11AM- 1:5 1PM-   1:5 3:30PM-1:5 4:30PM-1:5 8PM-     1:5 10PM-   1:5.5   Correction/Sensitivity: MN- 1:25 3:30AM-1:25  5AM- 1:25 6AM- 1:25 10AM- 1:30 11AM- 1:25 1PM-   1:20 3:30PM-1:20 4:30PM-1:20 8PM-     1:25 10PM-   1:25  Target: 125  Active insulin  time: 5 hours  -Metformin  extended release 500 mg in the morning and 1000 mg in the evening.  Prior diabetic  medications: Pioglitazone .  CONTINUOUS GLUCOSE MONITORING SYSTEM (CGMS) / INSULIN  PUMP INTERPRETATION:    Tandem t:slim pump with Dexcom G7  Date : December 12 to November 11, 2024, 14-day  Average total daily dose 52 units, basal 58%, bolus 42%, manual 60% control IQ 40%.  Control IQ 100%.  CGM in use 100%.  GMI 6.7%.                        Impression :  Mostly acceptable blood sugar throughout the day, with occasional mild hyperglycemic blood sugar in low 200s related to late meal bolus.  Hyperglycemia up to 250 range around December 30 related to use of dexamethasone  as a part of chemotherapy otherwise no concerning blood sugar.  No concerning hypoglycemia.  Hypoglycemia: Patient has minor hypoglycemic episodes. Patient has hypoglycemia awareness.    Factors modifying glucose control: 1.  Diabetic diet assessment: 3 meals a day.  Usually eats outside on Friday evening and weekend  2.  Staying active or exercising: Walking regularly.  3.  Medication compliance: compliant all of the time.  Interval history  Pump and CGM data as reviewed above.  Mostly acceptable blood sugar.  Hemoglobin A1c 5.8% today, likely falsely low due to anemia.  GMI on CGM 6.7%.  Patient has been on chemotherapy for relatively recently diagnosed breast cancer, receiving dexamethasone  with it.  No other complaints today.  REVIEW OF SYSTEMS As per history of present illness.   PAST MEDICAL HISTORY: Past Medical History:  Diagnosis Date   Arthritis    OA   Atrial flutter (HCC)    a. s/p TEE/DCCV 08/16/13 (normal LV function, no LAA thrombus).b. s/p ablation by Dr Waddell 09-23-2013   Coronary artery disease    2009 LAD Promus stent   Dysrhythmia    Aflutter, no issues after ablation 2014   Fibroid    Hyperlipidemia    Hypertension    Insulin  pump in place    Osteopenia    Type 1 diabetes mellitus on insulin  therapy (HCC)    Valvular heart disease    a. Mild MR/TR by TEE 08/2013.    PAST  SURGICAL HISTORY: Past Surgical History:  Procedure Laterality Date   ABLATION  09/23/2013   CTI by Dr Waddell   APPENDECTOMY     APPLICATION OF WOUND VAC Left 04/11/2017   Procedure: APPLICATION OF WOUND VAC LEFT KNEE;  Surgeon: Fidel Rogue, MD;  Location: [facility] OR;  Service: Orthopedics;  Laterality: Left;   ATRIAL FLUTTER ABLATION N/A 09/23/2013   Procedure: ATRIAL FLUTTER ABLATION;  Surgeon: Danelle LELON Waddell, MD;  Location: [facility] CATH LAB;  Service: Cardiovascular;  Laterality: N/A;   CARDIOVERSION N/A 08/16/2013   Procedure: CARDIOVERSION;  Surgeon: Aleene JINNY Passe, MD;  Location: [facility] ENDOSCOPY;  Service: Cardiovascular;  Laterality: N/A;  spoke with Tom   CATARACT EXTRACTION Bilateral    COLONOSCOPY     CORONARY ANGIOPLASTY WITH STENT PLACEMENT     EYE SURGERY     Cataract   LASER ABLATION Right 11/20/2023   Right leg laser ablation of the Greater saphenous vein with >20 stab phlebectomies   MYOMECTOMY  1977   ORIF PATELLA Left 04/11/2017   ORIF PATELLA Left 04/11/2017   Procedure: OPEN REDUCTION INTERNAL (ORIF) FIXATION PATELLA LEFT KNEE;  Surgeon: Fidel Rogue, MD;  Location: [facility] OR;  Service: Orthopedics;  Laterality: Left;   PELVIC LAPAROSCOPY     PORTACATH PLACEMENT Right 07/27/2024   Procedure: INSERTION OF TUNNELED CENTRAL VENOUS DEVICE WITH PORT;  Surgeon: Vernetta Berg, MD;  Location: [facility];  Service: General;  Laterality: Right;  PORT-A-CATH INSERTION WITH ULTRASOUND GUIDANCE   SENTINEL NODE BIOPSY Left 07/27/2024   Procedure: LEFT SENTINEL LYMPH NODE BIOPSY;  Surgeon: Vernetta Berg, MD;  Location:  SURGERY CENTER;  Service: General;  Laterality: Left;  SENTINEL NODE BIOPSY   TEE WITHOUT CARDIOVERSION N/A 08/16/2013   Procedure: TRANSESOPHAGEAL ECHOCARDIOGRAM (TEE);  Surgeon: Aleene JINNY Passe, MD;  Location: [facility] ENDOSCOPY;  Service: Cardiovascular;  Laterality: N/A;   TUBAL LIGATION  1988    ALLERGIES: Allergies  Allergen Reactions    Sulfa Antibiotics Rash   Sulfamethoxazole-Trimethoprim Rash    FAMILY HISTORY:  Family History  Problem Relation Age of Onset   CAD Father 50       died age 6   Early death Father    Heart disease Father    CAD Brother 30       small vessel disease   Early death Brother    Heart disease Brother    Atrial fibrillation Mother    Diabetes Mother    Hypertension Mother    Arthritis Mother    Hearing loss Mother    CAD Cousin  paternal   CAD Paternal Grandfather        early onset   Colon cancer Neg Hx     SOCIAL HISTORY: Social History   Socioeconomic History   Marital status: Married    Spouse name: [name]   Number of children: 0   Years of education: 14   Highest education level: Bachelor's degree (e.g., BA, AB, BS)  Occupational History   Occupation: home maker    Comment: Children adopted  Tobacco Use   Smoking status: Never    Passive exposure: Never   Smokeless tobacco: Never  Vaping Use   Vaping status: Never Used  Substance and Sexual Activity   Alcohol  use: Yes    Alcohol /week: 3.0 - 4.0 standard drinks of alcohol     Types: 3 - 4 Glasses of wine per week    Comment: on weekends   Drug use: Never   Sexual activity: Yes    Birth control/protection: Post-menopausal  Other Topics Concern   Not on file  Social History Narrative   Not on file   Social Drivers of Health   Tobacco Use: Low Risk (11/11/2024)   Patient History    Smoking Tobacco Use: Never    Smokeless Tobacco Use: Never    Passive Exposure: Never  Financial Resource Strain: Low Risk (01/27/2024)   Overall Financial Resource Strain (CARDIA)    Difficulty of Paying Living Expenses: Not hard at all  Food Insecurity: No Food Insecurity (06/30/2024)   Epic    Worried About Programme Researcher, Broadcasting/film/video in the Last Year: Never true    Ran Out of Food in the Last Year: Never true  Transportation Needs: No Transportation Needs (06/30/2024)   Epic    Lack of Transportation (Medical): No    Lack of  Transportation (Non-Medical): No  Physical Activity: Sufficiently Active (01/27/2024)   Exercise Vital Sign    Days of Exercise per Week: 7 days    Minutes of Exercise per Session: 30 min  Recent Concern: Physical Activity - Insufficiently Active (10/31/2023)   Exercise Vital Sign    Days of Exercise per Week: 3 days    Minutes of Exercise per Session: 30 min  Stress: No Stress Concern Present (01/27/2024)   [name] of Occupational Health - Occupational Stress Questionnaire    Feeling of Stress : Not at all  Social Connections: Socially Integrated (01/27/2024)   Social Connection and Isolation Panel    Frequency of Communication with Friends and Family: More than three times a week    Frequency of Social Gatherings with Friends and Family: More than three times a week    Attends Religious Services: More than 4 times per year    Active Member of Clubs or Organizations: Yes    Attends Banker Meetings: More than 4 times per year    Marital Status: Married  Depression (PHQ2-9): Low Risk (11/02/2024)   Depression (PHQ2-9)    PHQ-2 Score: 0  Alcohol  Screen: Low Risk (01/27/2024)   Alcohol  Screen    Last Alcohol  Screening Score (AUDIT): 3  Housing: Unknown (08/13/2024)   Received from [facility]   Epic    Unable to Pay for Housing in the Last Year: Not on file    Number of Times Moved in the Last Year: Not on file    At any time in the past 12 months, were you homeless or living in a shelter (including now)?: No  Utilities: Not At Risk (06/30/2024)  Epic    Threatened with loss of utilities: No  Health Literacy: Adequate Health Literacy (01/27/2024)   B1300 Health Literacy    Frequency of need for help with medical instructions: Never    MEDICATIONS:  Current Outpatient Medications  Medication Sig Dispense Refill   Calcium  Carbonate-Vitamin D  (CALCIUM  + D PO) Take 600 mg by mouth 2 (two) times daily.     cholecalciferol  (VITAMIN D ) 1000 units  tablet Take 1,000 Units by mouth 2 (two) times daily.      clopidogrel  (PLAVIX ) 75 MG tablet TAKE 1 TABLET DAILY 90 tablet 3   Continuous Glucose Sensor (DEXCOM G7 15 DAY SENSOR) MISC by Does not apply route.     cyanocobalamin  (VITAMIN B12) 1000 MCG tablet Take 1,000 mcg by mouth daily.     dexamethasone  (DECADRON ) 4 MG tablet Take 1 tabs by mouth 2 times daily starting day before chemo. Then take 1 tabs daily for 2 days starting day after chemo. Take with food. 30 tablet 1   ezetimibe  (ZETIA ) 10 MG tablet Take 1 tablet (10 mg total) by mouth daily. 90 tablet 3   gabapentin  (NEURONTIN ) 100 MG capsule Take 2 capsules (200 mg total) by mouth at bedtime. 180 capsule 0   Insulin  Lispro-aabc (LYUMJEV ) 100 UNIT/ML SOLN USE A MAXIMUM OF 90 UNITS DAILY VIA INSULIN  PUMP 80 mL 3   lidocaine -prilocaine  (EMLA ) cream Apply to affected area once 30 g 3   losartan  (COZAAR ) 100 MG tablet Take 1 tablet (100 mg total) by mouth daily. 90 tablet 3   metFORMIN  (GLUCOPHAGE -XR) 500 MG 24 hr tablet TAKE 1 TABLET IN THE MORNING AND 2 TABLETS IN THE EVENING AS DIRECTED 270 tablet 3   metoprolol  succinate (TOPROL -XL) 100 MG 24 hr tablet TAKE 1 TABLET DAILY 90 tablet 3   Multiple Vitamins-Minerals (PRESERVISION AREDS 2 PO) Take by mouth in the morning and at bedtime.     nitroGLYCERIN  (NITROSTAT ) 0.4 MG SL tablet DISSOLVE 1 TABLET UNDER THE TONGUE EVERY 5 MINUTES AS NEEDED FOR CHEST PAIN 75 tablet 2   ondansetron  (ZOFRAN ) 8 MG tablet Take 1 tablet (8 mg total) by mouth every 8 (eight) hours as needed for nausea or vomiting. Start on the third day after chemotherapy. 30 tablet 1   prochlorperazine  (COMPAZINE ) 10 MG tablet Take 1 tablet (10 mg total) by mouth every 6 (six) hours as needed for nausea or vomiting. 30 tablet 1   Pyridoxine HCl (VITAMIN B6) 100 MG TABS      rosuvastatin  (CRESTOR ) 40 MG tablet Take 1 tablet (40 mg total) by mouth daily. 90 tablet 3   solifenacin  (VESICARE ) 10 MG tablet TAKE 1 TABLET DAILY 90 tablet  3   spironolactone  (ALDACTONE ) 25 MG tablet Take 1 tablet (25 mg total) by mouth daily. 90 tablet 3   TART CHERRY PO Take by mouth.     traMADol  (ULTRAM ) 50 MG tablet Take 1 tablet (50 mg total) by mouth every 6 (six) hours as needed for moderate pain (pain score 4-6) or severe pain (pain score 7-10). 25 tablet 0   No current facility-administered medications for this visit.    PHYSICAL EXAM: Vitals:   11/11/24 1122  BP: 122/80  Pulse: 93  Resp: 16  SpO2: 96%  Weight: 176 lb 3.2 oz (79.9 kg)  Height: 5' 9 (1.753 m)      Body mass index is 26.02 kg/m.  Wt Readings from Last 3 Encounters:  11/11/24 176 lb 3.2 oz (79.9 kg)  11/02/24 190  lb 4.8 oz (86.3 kg)  09/14/24 193 lb 11.2 oz (87.9 kg)    General: Well developed, well nourished female in no apparent distress.  HEENT: AT/[place], no external lesions.  Eyes: Conjunctiva clear and no icterus. Neurologic: Alert, oriented, normal speech Psychiatric: Does not appear depressed or anxious  Diabetic Foot Exam - Simple   Simple Foot Form Diabetic Foot exam was performed with the following findings: Yes 11/11/2024 11:34 AM  Visual Inspection No deformities, no ulcerations, no other skin breakdown bilaterally: Yes Sensation Testing Intact to touch and monofilament testing bilaterally: Yes Pulse Check Posterior Tibialis and Dorsalis pulse intact bilaterally: Yes Comments    LABS Reviewed Lab Results  Component Value Date   HGBA1C 5.8 (A) 11/11/2024   HGBA1C 7.1 (H) 07/29/2024   HGBA1C 6.9 (H) 03/26/2024   Lab Results  Component Value Date   FRUCTOSAMINE 270 10/06/2019   FRUCTOSAMINE 292 (H) 02/04/2017   FRUCTOSAMINE 278 05/11/2013   Lab Results  Component Value Date   CHOL 88 03/26/2024   HDL 42 (L) 03/26/2024   LDLCALC 32 03/26/2024   LDLDIRECT 79.0 01/11/2015   TRIG 59 03/26/2024   CHOLHDL 2.1 03/26/2024   Lab Results  Component Value Date   MICRALBCREAT NOTE 07/29/2024   Lab Results  Component Value Date    CREATININE 0.71 11/02/2024   Lab Results  Component Value Date   GFR 67.08 04/02/2023    ASSESSMENT / PLAN  1. Type 1 diabetes mellitus without complications (HCC)    Diabetes Mellitus type 1, complicated by no known complications.  - Diabetic status / severity: Fair controlled.   Lab Results  Component Value Date   HGBA1C 5.8 (A) 11/11/2024    - Hemoglobin A1c goal <6.5%   Hemoglobin A1c 5.8% likely falsely low due to anemia.  GMI on CGM 6.7%.  Patient has type 1 diabetes mellitus diagnosed in 1979.   Patient is currently receiving chemotherapy cycles for relatively recently diagnosis of breast cancer.  She has been receiving dexamethasone .  Discussed that dexamethasone  can cause hyperglycemia and can have effect up to 72 hours.  Discussed about giving extra insulin  boluses for correction of hyperglycemia.  - Medications:  Tandem t:slim insulin  pump on control IQ using Dexcom G7.uses Lyumjev  U100  No change in the pump setting today.  -Continue metformin  extended release 500 mg in the morning and 1000 mg in the evening.  - Home glucose testing: continue CGM and check blood glucose as needed.  - Discussed/ Gave Hypoglycemia treatment plan.  # Consult : not required at this time.   # Annual urine for microalbuminuria/ creatinine ratio, no microalbuminuria currently, continue ACE/ARB losartan . Last  Lab Results  Component Value Date   MICRALBCREAT NOTE 07/29/2024    # Foot check nightly.  # Annual dilated diabetic eye exams.   - Diet: Eat reasonable portion sizes to promote a healthy weight - Life style / activity / exercise: Discussed  2. Blood pressure  -  BP Readings from Last 1 Encounters:  11/11/24 122/80    - Control is in target. - No change in current plans.  3. Lipid status / Hyperlipidemia - Last  Lab Results  Component Value Date   LDLCALC 32 03/26/2024   - Continue rosuvastatin  40 mg daily and Zetia  10 mg daily.  Diagnoses and all orders  for this visit:  Type 1 diabetes mellitus without complications (HCC) -     POCT glycosylated hemoglobin (Hb A1C)   DISPOSITION Follow up in clinic  in 3 months suggested.   All questions answered and patient verbalized understanding of the plan.  Kendy Haston, MD [facility] Endocrinology [facility] [address] [place], [address] [place], [place] [address] Phone # [phone]  At least part of this note was generated using voice recognition software. Inadvertent word errors may have occurred, which were not recognized during the proofreading process. "

## 2024-11-11 NOTE — Telephone Encounter (Signed)
 Patient requested pump accessories be submitted to [facility] healthcare. Order placed and notes uploaded in [place].

## 2024-11-15 ENCOUNTER — Ambulatory Visit

## 2024-11-15 ENCOUNTER — Other Ambulatory Visit (HOSPITAL_COMMUNITY): Payer: Self-pay

## 2024-11-15 DIAGNOSIS — M6281 Muscle weakness (generalized): Secondary | ICD-10-CM | POA: Diagnosis not present

## 2024-11-15 DIAGNOSIS — C50412 Malignant neoplasm of upper-outer quadrant of left female breast: Secondary | ICD-10-CM

## 2024-11-15 DIAGNOSIS — Z483 Aftercare following surgery for neoplasm: Secondary | ICD-10-CM

## 2024-11-15 DIAGNOSIS — R293 Abnormal posture: Secondary | ICD-10-CM

## 2024-11-15 NOTE — Therapy (Signed)
 " OUTPATIENT PHYSICAL THERAPY BREAST CANCER POST OP FOLLOW UP   Patient Name: [name] MRN: [id number] DOB:1946-10-21, 79 y.o., female Today's Date: 11/15/2024  END OF SESSION:  PT End of Session - 11/15/24 1357     Visit Number 9    Number of Visits 16    Date for Recertification  12/08/24    PT Start Time 1400    PT Stop Time 1450    PT Time Calculation (min) 50 min    Activity Tolerance Patient tolerated treatment well    Behavior During Therapy [facility] for tasks assessed/performed              Past Medical History:  Diagnosis Date   Arthritis    OA   Atrial flutter (HCC)    a. s/p TEE/DCCV 08/16/13 (normal LV function, no LAA thrombus).b. s/p ablation by Dr Waddell 09-23-2013   Coronary artery disease    2009 LAD Promus stent   Dysrhythmia    Aflutter, no issues after ablation 2014   Fibroid    Hyperlipidemia    Hypertension    Insulin  pump in place    Osteopenia    Type 1 diabetes mellitus on insulin  therapy (HCC)    Valvular heart disease    a. Mild MR/TR by TEE 08/2013.   Past Surgical History:  Procedure Laterality Date   ABLATION  09/23/2013   CTI by Dr Waddell   APPENDECTOMY     APPLICATION OF WOUND VAC Left 04/11/2017   Procedure: APPLICATION OF WOUND VAC LEFT KNEE;  Surgeon: Fidel Rogue, MD;  Location: [facility] OR;  Service: Orthopedics;  Laterality: Left;   ATRIAL FLUTTER ABLATION N/A 09/23/2013   Procedure: ATRIAL FLUTTER ABLATION;  Surgeon: Danelle LELON Waddell, MD;  Location: [facility] CATH LAB;  Service: Cardiovascular;  Laterality: N/A;   CARDIOVERSION N/A 08/16/2013   Procedure: CARDIOVERSION;  Surgeon: Aleene JINNY Passe, MD;  Location: [facility] ENDOSCOPY;  Service: Cardiovascular;  Laterality: N/A;  spoke with Tom   CATARACT EXTRACTION Bilateral    COLONOSCOPY     CORONARY ANGIOPLASTY WITH STENT PLACEMENT     EYE SURGERY     Cataract   LASER ABLATION Right 11/20/2023   Right leg laser ablation of the Greater saphenous vein with >20 stab phlebectomies    MYOMECTOMY  1977   ORIF PATELLA Left 04/11/2017   ORIF PATELLA Left 04/11/2017   Procedure: OPEN REDUCTION INTERNAL (ORIF) FIXATION PATELLA LEFT KNEE;  Surgeon: Fidel Rogue, MD;  Location: [facility] OR;  Service: Orthopedics;  Laterality: Left;   PELVIC LAPAROSCOPY     PORTACATH PLACEMENT Right 07/27/2024   Procedure: INSERTION OF TUNNELED CENTRAL VENOUS DEVICE WITH PORT;  Surgeon: Vernetta Berg, MD;  Location: [facility];  Service: General;  Laterality: Right;  PORT-A-CATH INSERTION WITH ULTRASOUND GUIDANCE   SENTINEL NODE BIOPSY Left 07/27/2024   Procedure: LEFT SENTINEL LYMPH NODE BIOPSY;  Surgeon: Vernetta Berg, MD;  Location: [facility];  Service: General;  Laterality: Left;  SENTINEL NODE BIOPSY   TEE WITHOUT CARDIOVERSION N/A 08/16/2013   Procedure: TRANSESOPHAGEAL ECHOCARDIOGRAM (TEE);  Surgeon: Aleene JINNY Passe, MD;  Location: [facility] ENDOSCOPY;  Service: Cardiovascular;  Laterality: N/A;   TUBAL LIGATION  1988   Patient Active Problem List   Diagnosis Date Noted   Atrial flutter (HCC) 07/05/2024   Malignant neoplasm of upper-outer quadrant of left breast in female, estrogen receptor positive (HCC) 06/28/2024   Acute lateral meniscal tear, right, initial encounter 09/02/2023   Varicose veins of  both lower extremities with pain 07/04/2023   Arthritis of carpometacarpal [facility]) joint of both thumbs 01/17/2022   Bilateral hearing loss 01/08/2022   Hypertension 05/13/2019   Urinary incontinence 01/19/2019   Valvular heart disease    Insulin  pump in place    Fibroid    Osteoarthritis    Mixed diabetic hyperlipidemia associated with type 1 diabetes mellitus (HCC) 03/16/2015   Type 1 diabetes mellitus with complication (HCC) 05/12/2013   Chronic insomnia 03/25/2012   Osteopenia    Atherosclerosis of native coronary artery of native heart with angina pectoris 04/05/2011    REFERRING PROVIDER: Dr. Vicenta Poli  REFERRING DIAG: Left breast  cancer  THERAPY DIAG:  Muscle weakness (generalized)  Aftercare following surgery for neoplasm  Abnormal posture  Malignant neoplasm of upper-outer quadrant of left breast in female, estrogen receptor positive (HCC)  Rationale for Evaluation and Treatment: Rehabilitation  ONSET DATE: 07/27/2024  SUBJECTIVE:                                                                                                                                                                                           SUBJECTIVE STATEMENT: I am feeling better. I have not been as diligent about my exercises because I didn't feel well. I walked the dog today, but she walks slow. It took about 40 min. 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 She underwent a left lumpectomy and sentinel node biopsy (4 negative nodes) on 07/27/2024. It is triple positive with a Ki67 of 3%. She is currently undergoing chemotherapy. Pt is unsure if she will need radiation.    PATIENT GOALS:  Reassess how my recovery is going related to arm function, pain, and swelling.  PAIN:  Are you having pain? Yes: NPRS scale: 2/10 Pain location: LB Pain description: nagging pain Aggravating factors: walking Relieving factors: advil  PRECAUTIONS: Recent Surgery, left UE Lymphedema risk  RED FLAGS: None   ACTIVITY LEVEL / LEISURE: walked the dogs a couple of block on Wednesday, has not been going to the fitness center lately due to fatigue   OBJECTIVE:   PATIENT SURVEYS:  QUICK DASH:     OBSERVATIONS: Healing lumpectomy scar but increased scar tissue palpable  POSTURE:  Increased kyphosis, forward head  LYMPHEDEMA ASSESSMENT:   UPPER EXTREMITY AROM/PROM:   A/PROM RIGHT   eval    Shoulder extension 50  Shoulder flexion 147  Shoulder abduction 151  Shoulder internal rotation 72  Shoulder external rotation 43                           (  Blank rows = not tested)   A/PROM LEFT   eval LEFT 10/11/2024 LEFT 11/10/24  Shoulder extension 51 74 60  Shoulder flexion 135 162 160  Shoulder abduction 157 160 165  Shoulder internal rotation 57 65 65  Shoulder external rotation 79 78 72                          (Blank rows = not tested)   CERVICAL AROM: All within normal limits:      Percent limited  Flexion WNL  Extension WFL  Right lateral flexion 50% limited  Left lateral flexion 75% limited  Right rotation 50% limited  Left rotation           50% limited      UPPER EXTREMITY STRENGTH: WFL   LYMPHEDEMA ASSESSMENTS (in cm):    LANDMARK RIGHT   eval RIGHT 10/11/2024  10 cm proximal to olecranon process 27.7 26.1  Olecranon process 26.5 25.5  10 cm proximal to ulnar styloid process 22 20  Just proximal to ulnar styloid process 17.4 16.7  Across hand at thumb web space 18.8 19.5  At base of 2nd digit 6.9 6.7  (Blank rows = not tested)   LANDMARK LEFT   eval LEFT 10/11/2024  10 cm proximal to olecranon process 27.2 25.7  Olecranon process 26.1 25  10  cm proximal to ulnar styloid process 21.9 20.7  Just proximal to ulnar styloid process 17.7 16.9  Across hand at thumb web space 19.4 19.6  At base of 2nd digit 6.8 6.6  (Blank rows = not tested)  LOWER EXTREMITY MMT:  MMT Right eval RIGHT 11/10/24  Hip flexion 3+ 5/5  Hip extension 3   Hip abduction 4+   Hip adduction    Hip internal rotation    Hip external rotation    Knee flexion 5   Knee extension 5   Ankle dorsiflexion 5   Ankle plantarflexion    Ankle inversion    Ankle eversion     (Blank rows = not tested)  MMT LEFT eval LEFT 11/10/24  Hip flexion 3+ 5/5  Hip extension 3   Hip abduction 4+   Hip adduction    Hip internal rotation    Hip external rotation    Knee flexion 4   Knee extension 5   Ankle dorsiflexion 4+   Ankle plantarflexion    Ankle inversion    Ankle eversion     (Blank rows = not tested)  Surgery  type/Date: 07/27/2024 left lumpectomy and sentinel node biopsy Number of lymph nodes removed: 4 Current/past treatment (chemo, radiation, hormone therapy): chemotherapy Other symptoms:  Heaviness/tightness No Pain No Pitting edema No Infections No Decreased scar mobility Yes Stemmer sign No  30 SEC SIT TO STAND: 10 reps which is below average for her age  SINGLE LIMB STANCE:  11/10/24: R - 2 sec, L 6 sec  At eval: R - 5 seconds, L 8 seconds  GAIT: Shortened step length bilaterally, foot flat, unsteady at time but no losses of balance  TANDEM STANCE:  11/11/23: 9 sec At eval:Tandem with R foot in from 3 sec, with L foot in front 8 seconds  TREATMENT PERFORMED:  11/15/2024 NuStep level 4, seat at 10, arms at 9 x 10 min, 905  Dual cable machine: 3 lbs for scap retraction x 10 reps with pt returning therapist demo, then x 5 ea in half tandem stance Incline stretch x 3, 20 secs  Standing march in // bars x 10 ea Standing heel raises x 10 ea Ax beam x 6 lengths forward, x 4 lengths sidestepping with intermittent hand hold, and encouragement to focus on object, seated rest SLS on blue foam 3 D SLR;flex, abd ext x 10 ea with intermittent HH Tandem stance B x 3 ea to failure, more difficult with Left leg forward. Bilateral HS stretch x 2 ea., 20 seconds  11/10/24 Assessed progress towards all goals in therapy - assessed strength and balance NuStep level 4, seat at 10, arms at 9 x 10 min - 773 steps  Tandem walking on air ex foam x 4 with bilateral HHA with increased fatigue requiring seated recovery period afterwards  Attempted 3 way hip on purple foam but pt became very fatigued and short of breath after 10 reps of L hip flexion and required another seated recovery periods due to fatigue and shortness of breath Self care- discussed how chemo effects the body and how it is important to listen to the body such as dizziness and shortness of breath and take recovery periods as needed and not to  push during the time right after chemo. Also educated pt how balance is effected by chemo.   11/03/2024 NuStep level 4, seat at 10, arms at 9 x 10 min - 952 steps  Dual cable machine: 3 lbs for scap retraction x 10 reps with pt returning therapist demo Leg press seat at 7, 70 lbs 1 x 580 lbs- 1 sets of 10 reps, 85 lbs 1 x 10 reps Bilateral HS stretch seated 3 x 20 sec 3 way hip machine: 25 lbs in flexion, abduction and extension x 10 each bilaterally with pt returning therapist demo Ball squeeze 5 sec hold x 10 Tandem stance in// bars; 2 B, 30 sec with intermittent HH  And VC's for posture and core stab  11/01/24:  Therapeutic Exercise: Seated hamstring stretch x 30 sec holds x 2 reps bilaterally with hands on hips to keep from rounded back NuStep level 4, seat at 10, arms at 9 x 10 min - steps 835 3 way hip machine: 25 lbs in flexion, abduction and extension x 10 each bilaterally with pt returning therapist demo Leg press seat at 7, 70 lbs- 2 sets of 10 reps Dual cable machine: 3 lbs for scap retraction x 10 reps with pt returning therapist demo Therapeutic Activity: Bridging x 10 reps with 5 sec holds with v/c to engage core throughout and not just push up with glutes Pelvic tilts x 10 reps with 5 sec holds with pt demonstrating good form Seated on red disc for core engagement: TKE with 2lb ankle weights with 3 sec holds x 10 reps bilaterally  Seated hip flexion with 2 lb ankle weights x 10 with v/c to not lean back Ball squeezes with 5 sec holds x 10   10/21/24:  Therapeutic Exercise: Seated hamstring stretch x 30 sec holds x 2 reps bilaterally with hands on hips to keep from rounded back NuStep level 4, seat at 10, arms at 9 (no arms half way through due to hx of SLNB) x 10 min - steps 940 Therapeutic Activity: Bridging x 12 reps with 5 sec holds with pt demonstrating good form today and no hamstring cramping Pelvic tilts x 10 reps with 5 sec holds with pt demonstrating good  form Scapular retraction with v/c to pull back and down x 10 reps with yellow band and cues for upright posture and keeping elbows  close Seated on red disc for core engagement: TKE with 2lb ankle weights with 3 sec holds x 10 reps bilaterally  Seated hip flexion with 2 lb ankle weights x 10 with v/c to not lean back Ball squeezes with 5 sec holds x 13  Hip abduction x 10 reps with green theraband Neuro reed: Standing on air ex with occasional HHA for balance: hip flexion x 10 reps bilaterally then hip extension x 10 bilaterally with more difficulty with balance, hip abduction x 10 bilaterally  Tandem walking x 4 on air ex beam with occasional hand held assist  10/19/24:  Therapeutic Exercise: Seated hamstring stretch x 30 sec holds x 2 reps bilaterally with v/c to keep from rounding back TKE with 2lb ankle weights with 3 sec holds x 10 reps bilaterally  Seated hip flexion with 2 lb ankle weights x 10 with v/c to not lean back NuStep level 4, seat at 10, arms at 9 (no arms half way through due to hx of SLNB) x 10 min - steps 870 Therapeutic Activity: Bridging x 10 reps with 5 sec holds with pt demonstrating good form today and no hamstring cramping Pelvic tilts x 10 reps with 5 sec holds with pt demonstrating good form Scapular retraction with v/c to pull back and down x 10 reps with yellow band and cues for upright posture and keeping elbows close Neuro reed: Standing on air ex with 1 HHA: hip flexion x 10 reps bilaterally then hip extension with v/c to keep knee straight with pt having difficulty clearing mat due to inability to hip hike  10/12/24:  Therapeutic Exercise: Seated hamstring stretch x 30 sec holds x 2 reps bilaterally with v/c to keep from rounding back TKE with 2lb ankle weights with 3 sec holds x 10 reps bilaterally  Seated hip flexion with 2 lb ankle weights x 10 with v/c to not lean back NuStep level 3, seat at 10, arms at 9 (using arms 2 min on/2 min off due to hx of  SLNB) x 10 min Therapeutic Activity: Bridging x 10 reps with v/c and t/c to use the core and engage core throughout to avoid hamstring cramping Pelvic tilts x 10 reps with 5 sec holds with v/c and t/c for proper form Scapular retraction with v/c to pull back and down x 10 reps with red band   PATIENT EDUCATION:  Education details: how chemo effects the body including fatigue and balance and importance of not pushing through in the time right after chemo Person educated: Patient Education method: Explanation Education comprehension: verbalized understanding  HOME EXERCISE PROGRAM: Reviewed previously given post op HEP. Practice sit to stands from chair without use of UEs  Access Code: [email or web address] URL: [email or web address] Date: 10/12/2024 Prepared by: Florina Lanis Carbon  Exercises - Seated Hamstring Stretch  - 1 x daily - 7 x weekly - 1 sets - 2 reps - 30 sec hold - Seated Hip Flexion March with Ankle Weights  - 1 x daily - 7 x weekly - 1 sets - 10 reps - Seated Long Arc Quad with Ankle Weight  - 1 x daily - 7 x weekly - 1 sets - 10 reps - 3 sec hold - Supine Bridge  - 1 x daily - 7 x weekly - 1 sets - 10 reps - Supine Posterior Pelvic Tilt  - 1 x daily - 7 x weekly - 1-3 sets - 10 reps - 5 sec hold - Scapular Retraction with Resistance  -  1 x daily - 7 x weekly - 1 sets - 10 reps   ASSESSMENT:  CLINICAL IMPRESSION: Pt was feeling better today from her chemo and was able to increase her activities in clinic slightly. 1 brief episode of LBP improved after a short seated rest break. Mild fatigue after treatment today. Advised to rest remainder of the day as she had already walked 40 min.before coming today.  Pt will benefit from skilled therapeutic intervention to improve on the following deficits: Decreased knowledge of precautions, impaired UE functional use, pain, decreased ROM, postural dysfunction.   PT treatment/interventions: ADL/Self care home management,  [phone]- PT Re-evaluation, 97110-Therapeutic exercises, 97530- Therapeutic activity, [id number]- Neuromuscular re-education, 97535- Self Care, [id number]- Manual therapy, [phone]- Gait training, and Patient/Family education   GOALS: Goals reviewed with patient? Yes  GOALS MET AT EVAL:  GOALS Name Target Date Goal status  1 Pt will be able to verbalize understanding of pertinent lymphedema risk reduction practices relevant to her dx specifically related to skin care.  Baseline:  No knowledge Eval Achieved at eval  2 Pt will be able to return demo and/or verbalize understanding of the post op HEP related to regaining shoulder ROM. Baseline:  No knowledge Eval Achieved at eval  3 Pt will be able to verbalize understanding of the importance of viewing the post op After Breast CA Class video for further lymphedema risk reduction education and therapeutic exercise.  Baseline:  No knowledge Eval Achieved at eval   LONG TERM GOALS:  (STG=LTG)  GOALS Name Target Date  Goal status  1 Pt will demonstrate she has regained full shoulder ROM and function post operatively compared to baselines.  Baseline: 08/25/2024 MET 11/10/24  2 Pt will demonstrate at least 4/5 bilateral hip flexion strength to decrease fall risk. 11/08/24 MET 11/10/24  3 Pt will be able to complete SLS bilaterally for at least 15 sec to decrease fall risk. 11/08/24 ONGOING 11/10/24  4 Pt will be able to stand in tandem stance for 15 sec with no losses of balance to decrease fall risk. 11/08/24 ONGOING 11/10/24  5 Pt will be independent in a home exercise program for continued stretching and strengthening.  11/08/24 ONGOING 11/10/24     PLAN:  PT FREQUENCY/DURATION: 2x/wk for 4 weeks  PLAN FOR NEXT SESSION: how are effects from chemo?  NuStep, hip strengthening, balance exercises, air ex beam, hamstring stretching, posture exercises, give post op handout   [facility]  [address]  [place] [address]  [phone]  After Breast Cancer Class Video It is recommended you view the ABC class video to be educated on lymphedema risk reduction. This video lasts for about 30 minutes. It can be viewed on our website here: [email or web address]  Scar massage You can begin gentle scar massage to you incision sites. Gently place one hand on the incision and move the skin (without sliding on the skin) in various directions. Do this for a few minutes and then you can gently massage either coconut oil or vitamin E cream into the scars.  Compression garment You should continue wearing your compression bra until you feel like you no longer have swelling.  Home exercise Program Continue doing the exercises you were given until you feel like you can do them without feeling any tightness at the end.   Walking Program Studies show that 30 minutes of walking per day (fast enough to elevate your heart rate) can significantly reduce the risk of a cancer  recurrence. If you can't walk due to other medical reasons, we encourage you to find another activity you could do (like a stationary bike or water exercise).  Posture After breast cancer surgery, people frequently sit with rounded shoulders posture because it puts their incisions on slack and feels better. If you sit like this and scar tissue forms in that position, you can become very tight and have pain sitting or standing with good posture. Try to be aware of your posture and sit and stand up tall to heal properly.  Follow up PT: It is recommended you return every 3 months for the first 3 years following surgery to be assessed on the SOZO machine for an L-Dex score. This helps prevent clinically significant lymphedema in 95% of patients. These follow up screens are 10 minute appointments that you are not billed for.  Grayce JINNY Sheldon, PT 11/15/2024, 2:54 PM  "

## 2024-11-17 ENCOUNTER — Ambulatory Visit

## 2024-11-17 DIAGNOSIS — M6281 Muscle weakness (generalized): Secondary | ICD-10-CM

## 2024-11-17 DIAGNOSIS — R293 Abnormal posture: Secondary | ICD-10-CM

## 2024-11-17 DIAGNOSIS — Z483 Aftercare following surgery for neoplasm: Secondary | ICD-10-CM

## 2024-11-17 DIAGNOSIS — Z170 Estrogen receptor positive status [ER+]: Secondary | ICD-10-CM

## 2024-11-17 NOTE — Therapy (Signed)
 " OUTPATIENT PHYSICAL THERAPY BREAST CANCER POST OP FOLLOW UP   Patient Name: [name] MRN: [id number] DOB:05-10-1946, 79 y.o., female Today's Date: 11/17/2024  END OF SESSION:  PT End of Session - 11/17/24 1100     Visit Number 10    Number of Visits 16    Date for Recertification  12/08/24    PT Start Time 1102    PT Stop Time 1156    PT Time Calculation (min) 54 min    Activity Tolerance Patient tolerated treatment well    Behavior During Therapy [facility] for tasks assessed/performed              Past Medical History:  Diagnosis Date   Arthritis    OA   Atrial flutter (HCC)    a. s/p TEE/DCCV 08/16/13 (normal LV function, no LAA thrombus).b. s/p ablation by Dr Waddell 09-23-2013   Coronary artery disease    2009 LAD Promus stent   Dysrhythmia    Aflutter, no issues after ablation 2014   Fibroid    Hyperlipidemia    Hypertension    Insulin  pump in place    Osteopenia    Type 1 diabetes mellitus on insulin  therapy (HCC)    Valvular heart disease    a. Mild MR/TR by TEE 08/2013.   Past Surgical History:  Procedure Laterality Date   ABLATION  09/23/2013   CTI by Dr Waddell   APPENDECTOMY     APPLICATION OF WOUND VAC Left 04/11/2017   Procedure: APPLICATION OF WOUND VAC LEFT KNEE;  Surgeon: Fidel Rogue, MD;  Location: [facility] OR;  Service: Orthopedics;  Laterality: Left;   ATRIAL FLUTTER ABLATION N/A 09/23/2013   Procedure: ATRIAL FLUTTER ABLATION;  Surgeon: Danelle LELON Waddell, MD;  Location: [facility] CATH LAB;  Service: Cardiovascular;  Laterality: N/A;   CARDIOVERSION N/A 08/16/2013   Procedure: CARDIOVERSION;  Surgeon: Aleene JINNY Passe, MD;  Location: [facility] ENDOSCOPY;  Service: Cardiovascular;  Laterality: N/A;  spoke with Tom   CATARACT EXTRACTION Bilateral    COLONOSCOPY     CORONARY ANGIOPLASTY WITH STENT PLACEMENT     EYE SURGERY     Cataract   LASER ABLATION Right 11/20/2023   Right leg laser ablation of the Greater saphenous vein with >20 stab phlebectomies    MYOMECTOMY  1977   ORIF PATELLA Left 04/11/2017   ORIF PATELLA Left 04/11/2017   Procedure: OPEN REDUCTION INTERNAL (ORIF) FIXATION PATELLA LEFT KNEE;  Surgeon: Fidel Rogue, MD;  Location: [facility] OR;  Service: Orthopedics;  Laterality: Left;   PELVIC LAPAROSCOPY     PORTACATH PLACEMENT Right 07/27/2024   Procedure: INSERTION OF TUNNELED CENTRAL VENOUS DEVICE WITH PORT;  Surgeon: Vernetta Berg, MD;  Location: [facility];  Service: General;  Laterality: Right;  PORT-A-CATH INSERTION WITH ULTRASOUND GUIDANCE   SENTINEL NODE BIOPSY Left 07/27/2024   Procedure: LEFT SENTINEL LYMPH NODE BIOPSY;  Surgeon: Vernetta Berg, MD;  Location: [facility];  Service: General;  Laterality: Left;  SENTINEL NODE BIOPSY   TEE WITHOUT CARDIOVERSION N/A 08/16/2013   Procedure: TRANSESOPHAGEAL ECHOCARDIOGRAM (TEE);  Surgeon: Aleene JINNY Passe, MD;  Location: [facility] ENDOSCOPY;  Service: Cardiovascular;  Laterality: N/A;   TUBAL LIGATION  1988   Patient Active Problem List   Diagnosis Date Noted   Atrial flutter (HCC) 07/05/2024   Malignant neoplasm of upper-outer quadrant of left breast in female, estrogen receptor positive (HCC) 06/28/2024   Acute lateral meniscal tear, right, initial encounter 09/02/2023   Varicose veins of  both lower extremities with pain 07/04/2023   Arthritis of carpometacarpal [facility]) joint of both thumbs 01/17/2022   Bilateral hearing loss 01/08/2022   Hypertension 05/13/2019   Urinary incontinence 01/19/2019   Valvular heart disease    Insulin  pump in place    Fibroid    Osteoarthritis    Mixed diabetic hyperlipidemia associated with type 1 diabetes mellitus (HCC) 03/16/2015   Type 1 diabetes mellitus with complication (HCC) 05/12/2013   Chronic insomnia 03/25/2012   Osteopenia    Atherosclerosis of native coronary artery of native heart with angina pectoris 04/05/2011    REFERRING PROVIDER: Dr. Vicenta Poli  REFERRING DIAG: Left breast  cancer  THERAPY DIAG:  Muscle weakness (generalized)  Aftercare following surgery for neoplasm  Abnormal posture  Malignant neoplasm of upper-outer quadrant of left breast in female, estrogen receptor positive (HCC)  Rationale for Evaluation and Treatment: Rehabilitation  ONSET DATE: 07/27/2024  SUBJECTIVE:                                                                                                                                                                                           SUBJECTIVE STATEMENT: I did OK after last visit, but I was tired. I had walked the dog already and then worked out here. I haven't exercised yet today. 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 She underwent a left lumpectomy and sentinel node biopsy (4 negative nodes) on 07/27/2024. It is triple positive with a Ki67 of 3%. She is currently undergoing chemotherapy. Pt is unsure if she will need radiation.    PATIENT GOALS:  Reassess how my recovery is going related to arm function, pain, and swelling.  PAIN:  Are you having pain? Yes: NPRS scale: 2/10 Pain location: LB Pain description: nagging pain Aggravating factors: walking Relieving factors: advil  PRECAUTIONS: Recent Surgery, left UE Lymphedema risk  RED FLAGS: None   ACTIVITY LEVEL / LEISURE: walked the dogs a couple of block on Wednesday, has not been going to the fitness center lately due to fatigue   OBJECTIVE:   PATIENT SURVEYS:  QUICK DASH:     OBSERVATIONS: Healing lumpectomy scar but increased scar tissue palpable  POSTURE:  Increased kyphosis, forward head  LYMPHEDEMA ASSESSMENT:   UPPER EXTREMITY AROM/PROM:   A/PROM RIGHT   eval    Shoulder extension 50  Shoulder flexion 147  Shoulder abduction 151  Shoulder internal rotation 72  Shoulder external rotation 43                          (  Blank rows = not tested)   A/PROM LEFT   eval  LEFT 10/11/2024 LEFT 11/10/24  Shoulder extension 51 74 60  Shoulder flexion 135 162 160  Shoulder abduction 157 160 165  Shoulder internal rotation 57 65 65  Shoulder external rotation 79 78 72                          (Blank rows = not tested)   CERVICAL AROM: All within normal limits:      Percent limited  Flexion WNL  Extension WFL  Right lateral flexion 50% limited  Left lateral flexion 75% limited  Right rotation 50% limited  Left rotation           50% limited      UPPER EXTREMITY STRENGTH: WFL   LYMPHEDEMA ASSESSMENTS (in cm):    LANDMARK RIGHT   eval RIGHT 10/11/2024  10 cm proximal to olecranon process 27.7 26.1  Olecranon process 26.5 25.5  10 cm proximal to ulnar styloid process 22 20  Just proximal to ulnar styloid process 17.4 16.7  Across hand at thumb web space 18.8 19.5  At base of 2nd digit 6.9 6.7  (Blank rows = not tested)   LANDMARK LEFT   eval LEFT 10/11/2024  10 cm proximal to olecranon process 27.2 25.7  Olecranon process 26.1 25  10  cm proximal to ulnar styloid process 21.9 20.7  Just proximal to ulnar styloid process 17.7 16.9  Across hand at thumb web space 19.4 19.6  At base of 2nd digit 6.8 6.6  (Blank rows = not tested)  LOWER EXTREMITY MMT:  MMT Right eval RIGHT 11/10/24  Hip flexion 3+ 5/5  Hip extension 3   Hip abduction 4+   Hip adduction    Hip internal rotation    Hip external rotation    Knee flexion 5   Knee extension 5   Ankle dorsiflexion 5   Ankle plantarflexion    Ankle inversion    Ankle eversion     (Blank rows = not tested)  MMT LEFT eval LEFT 11/10/24  Hip flexion 3+ 5/5  Hip extension 3   Hip abduction 4+   Hip adduction    Hip internal rotation    Hip external rotation    Knee flexion 4   Knee extension 5   Ankle dorsiflexion 4+   Ankle plantarflexion    Ankle inversion    Ankle eversion     (Blank rows = not tested)  Surgery type/Date: 07/27/2024 left lumpectomy and sentinel node biopsy Number  of lymph nodes removed: 4 Current/past treatment (chemo, radiation, hormone therapy): chemotherapy Other symptoms:  Heaviness/tightness No Pain No Pitting edema No Infections No Decreased scar mobility Yes Stemmer sign No  30 SEC SIT TO STAND: 10 reps which is below average for her age  SINGLE LIMB STANCE:  11/10/24: R - 2 sec, L 6 sec  At eval: R - 5 seconds, L 8 seconds  GAIT: Shortened step length bilaterally, foot flat, unsteady at time but no losses of balance  TANDEM STANCE:  11/11/23: 9 sec At eval:Tandem with R foot in from 3 sec, with L foot in front 8 seconds  TREATMENT PERFORMED:  11/17/2024 Sit to stand from chair x 2 but difficult without UeEs Sit to stand from high table with red plyoball overhead press and slow lower, 3 x 5 Nu step seat 10, UE 9 L4 x 10 min, 965 steps Incline stretch x  3, 20 sec Dual cable retraction 3 # 1 x 10 with feet in staggered stance, x 10 with near tandem stance, x 5 near tandem Ambulated to ortho gym and went up and down 4 steps with bilateral rails with slow lowering x 3 Leg Press 85# B 2 x 10 reps 4 hurdles in //bars; forward walking 6 lengths in foot in between, sidestepping both feet between 4 lengths with light HH HS stretch seated x 3 ea, 20 sec 11/15/2024 NuStep level 4, seat at 10, arms at 9 x 10 min, 905  Dual cable machine: 3 lbs for scap retraction x 10 reps with pt returning therapist demo, then x 5 ea in half tandem stance Incline stretch x 3, 20 secs Standing march in // bars x 10 ea Standing heel raises x 10 ea Ax beam x 6 lengths forward, x 4 lengths sidestepping with intermittent hand hold, and encouragement to focus on object, seated rest SLS on blue foam 3 D SLR;flex, abd ext x 10 ea with intermittent HH Tandem stance B x 3 ea to failure, more difficult with Left leg forward. Bilateral HS stretch x 2 ea., 20 seconds  11/10/24 Assessed progress towards all goals in therapy - assessed strength and balance NuStep level  4, seat at 10, arms at 9 x 10 min - 773 steps  Tandem walking on air ex foam x 4 with bilateral HHA with increased fatigue requiring seated recovery period afterwards  Attempted 3 way hip on purple foam but pt became very fatigued and short of breath after 10 reps of L hip flexion and required another seated recovery periods due to fatigue and shortness of breath Self care- discussed how chemo effects the body and how it is important to listen to the body such as dizziness and shortness of breath and take recovery periods as needed and not to push during the time right after chemo. Also educated pt how balance is effected by chemo.   11/03/2024 NuStep level 4, seat at 10, arms at 9 x 10 min - 952 steps  Dual cable machine: 3 lbs for scap retraction x 10 reps with pt returning therapist demo Leg press seat at 7, 70 lbs 1 x 580 lbs- 1 sets of 10 reps, 85 lbs 1 x 10 reps Bilateral HS stretch seated 3 x 20 sec 3 way hip machine: 25 lbs in flexion, abduction and extension x 10 each bilaterally with pt returning therapist demo Ball squeeze 5 sec hold x 10 Tandem stance in// bars; 2 B, 30 sec with intermittent HH  And VC's for posture and core stab  11/01/24:  Therapeutic Exercise: Seated hamstring stretch x 30 sec holds x 2 reps bilaterally with hands on hips to keep from rounded back NuStep level 4, seat at 10, arms at 9 x 10 min - steps 835 3 way hip machine: 25 lbs in flexion, abduction and extension x 10 each bilaterally with pt returning therapist demo Leg press seat at 7, 70 lbs- 2 sets of 10 reps Dual cable machine: 3 lbs for scap retraction x 10 reps with pt returning therapist demo Therapeutic Activity: Bridging x 10 reps with 5 sec holds with v/c to engage core throughout and not just push up with glutes Pelvic tilts x 10 reps with 5 sec holds with pt demonstrating good form Seated on red disc for core engagement: TKE with 2lb ankle weights with 3 sec holds x 10 reps bilaterally   Seated hip flexion with 2  lb ankle weights x 10 with v/c to not lean back Ball squeezes with 5 sec holds x 10   10/21/24:  Therapeutic Exercise: Seated hamstring stretch x 30 sec holds x 2 reps bilaterally with hands on hips to keep from rounded back NuStep level 4, seat at 10, arms at 9 (no arms half way through due to hx of SLNB) x 10 min - steps 940 Therapeutic Activity: Bridging x 12 reps with 5 sec holds with pt demonstrating good form today and no hamstring cramping Pelvic tilts x 10 reps with 5 sec holds with pt demonstrating good form Scapular retraction with v/c to pull back and down x 10 reps with yellow band and cues for upright posture and keeping elbows close Seated on red disc for core engagement: TKE with 2lb ankle weights with 3 sec holds x 10 reps bilaterally  Seated hip flexion with 2 lb ankle weights x 10 with v/c to not lean back Ball squeezes with 5 sec holds x 13  Hip abduction x 10 reps with green theraband Neuro reed: Standing on air ex with occasional HHA for balance: hip flexion x 10 reps bilaterally then hip extension x 10 bilaterally with more difficulty with balance, hip abduction x 10 bilaterally  Tandem walking x 4 on air ex beam with occasional hand held assist  10/19/24:  Therapeutic Exercise: Seated hamstring stretch x 30 sec holds x 2 reps bilaterally with v/c to keep from rounding back TKE with 2lb ankle weights with 3 sec holds x 10 reps bilaterally  Seated hip flexion with 2 lb ankle weights x 10 with v/c to not lean back NuStep level 4, seat at 10, arms at 9 (no arms half way through due to hx of SLNB) x 10 min - steps 870 Therapeutic Activity: Bridging x 10 reps with 5 sec holds with pt demonstrating good form today and no hamstring cramping Pelvic tilts x 10 reps with 5 sec holds with pt demonstrating good form Scapular retraction with v/c to pull back and down x 10 reps with yellow band and cues for upright posture and keeping elbows  close Neuro reed: Standing on air ex with 1 HHA: hip flexion x 10 reps bilaterally then hip extension with v/c to keep knee straight with pt having difficulty clearing mat due to inability to hip hike  10/12/24:  Therapeutic Exercise: Seated hamstring stretch x 30 sec holds x 2 reps bilaterally with v/c to keep from rounding back TKE with 2lb ankle weights with 3 sec holds x 10 reps bilaterally  Seated hip flexion with 2 lb ankle weights x 10 with v/c to not lean back NuStep level 3, seat at 10, arms at 9 (using arms 2 min on/2 min off due to hx of SLNB) x 10 min Therapeutic Activity: Bridging x 10 reps with v/c and t/c to use the core and engage core throughout to avoid hamstring cramping Pelvic tilts x 10 reps with 5 sec holds with v/c and t/c for proper form Scapular retraction with v/c to pull back and down x 10 reps with red band   PATIENT EDUCATION:  Education details: how chemo effects the body including fatigue and balance and importance of not pushing through in the time right after chemo Person educated: Patient Education method: Explanation Education comprehension: verbalized understanding  HOME EXERCISE PROGRAM: Reviewed previously given post op HEP. Practice sit to stands from chair without use of UEs  Access Code: [email or web address] URL: [email or web address] Date: 10/12/2024 Prepared by:  Blaire Breedlove Blue  Exercises - Seated Hamstring Stretch  - 1 x daily - 7 x weekly - 1 sets - 2 reps - 30 sec hold - Seated Hip Flexion March with Ankle Weights  - 1 x daily - 7 x weekly - 1 sets - 10 reps - Seated Long Arc Quad with Ankle Weight  - 1 x daily - 7 x weekly - 1 sets - 10 reps - 3 sec hold - Supine Bridge  - 1 x daily - 7 x weekly - 1 sets - 10 reps - Supine Posterior Pelvic Tilt  - 1 x daily - 7 x weekly - 1-3 sets - 10 reps - 5 sec hold - Scapular Retraction with Resistance  - 1 x daily - 7 x weekly - 1 sets - 10 reps   ASSESSMENT:  CLINICAL  IMPRESSION: Pt worked very hard today in clinic. Audible left foot tap with ambulation noted due to weak DF. Will address next visit with TB and toe taps.  Pt will benefit from skilled therapeutic intervention to improve on the following deficits: Decreased knowledge of precautions, impaired UE functional use, pain, decreased ROM, postural dysfunction.   PT treatment/interventions: ADL/Self care home management, [phone]- PT Re-evaluation, 97110-Therapeutic exercises, 97530- Therapeutic activity, [id number]- Neuromuscular re-education, 97535- Self Care, [id number]- Manual therapy, [phone]- Gait training, and Patient/Family education   GOALS: Goals reviewed with patient? Yes  GOALS MET AT EVAL:  GOALS Name Target Date Goal status  1 Pt will be able to verbalize understanding of pertinent lymphedema risk reduction practices relevant to her dx specifically related to skin care.  Baseline:  No knowledge Eval Achieved at eval  2 Pt will be able to return demo and/or verbalize understanding of the post op HEP related to regaining shoulder ROM. Baseline:  No knowledge Eval Achieved at eval  3 Pt will be able to verbalize understanding of the importance of viewing the post op After Breast CA Class video for further lymphedema risk reduction education and therapeutic exercise.  Baseline:  No knowledge Eval Achieved at eval   LONG TERM GOALS:  (STG=LTG)  GOALS Name Target Date  Goal status  1 Pt will demonstrate she has regained full shoulder ROM and function post operatively compared to baselines.  Baseline: 08/25/2024 MET 11/10/24  2 Pt will demonstrate at least 4/5 bilateral hip flexion strength to decrease fall risk. 11/08/24 MET 11/10/24  3 Pt will be able to complete SLS bilaterally for at least 15 sec to decrease fall risk. 11/08/24 ONGOING 11/10/24  4 Pt will be able to stand in tandem stance for 15 sec with no losses of balance to decrease fall risk. 11/08/24 ONGOING 11/10/24  5 Pt will be independent in a home  exercise program for continued stretching and strengthening.  11/08/24 ONGOING 11/10/24     PLAN:  PT FREQUENCY/DURATION: 2x/wk for 4 weeks  PLAN FOR NEXT SESSION: DF strength with TB especially for left,how are effects from chemo?  NuStep, hip strengthening, balance exercises, air ex beam, hamstring stretching, posture exercises, give post op handout   [facility]  [address]  [place] [address]  [phone]  After Breast Cancer Class Video It is recommended you view the ABC class video to be educated on lymphedema risk reduction. This video lasts for about 30 minutes. It can be viewed on our website here: [email or web address]  Scar massage You can begin gentle scar massage to you incision sites. Gently place one hand on  the incision and move the skin (without sliding on the skin) in various directions. Do this for a few minutes and then you can gently massage either coconut oil or vitamin E cream into the scars.  Compression garment You should continue wearing your compression bra until you feel like you no longer have swelling.  Home exercise Program Continue doing the exercises you were given until you feel like you can do them without feeling any tightness at the end.   Walking Program Studies show that 30 minutes of walking per day (fast enough to elevate your heart rate) can significantly reduce the risk of a cancer recurrence. If you can't walk due to other medical reasons, we encourage you to find another activity you could do (like a stationary bike or water exercise).  Posture After breast cancer surgery, people frequently sit with rounded shoulders posture because it puts their incisions on slack and feels better. If you sit like this and scar tissue forms in that position, you can become very tight and have pain sitting or standing with good posture. Try to be aware of  your posture and sit and stand up tall to heal properly.  Follow up PT: It is recommended you return every 3 months for the first 3 years following surgery to be assessed on the SOZO machine for an L-Dex score. This helps prevent clinically significant lymphedema in 95% of patients. These follow up screens are 10 minute appointments that you are not billed for.  Grayce JINNY Sheldon, PT 11/17/2024, 11:57 AM  "

## 2024-11-19 ENCOUNTER — Other Ambulatory Visit: Payer: Self-pay | Admitting: Surgery

## 2024-11-19 ENCOUNTER — Telehealth (HOSPITAL_BASED_OUTPATIENT_CLINIC_OR_DEPARTMENT_OTHER): Payer: Self-pay

## 2024-11-19 ENCOUNTER — Telehealth (HOSPITAL_BASED_OUTPATIENT_CLINIC_OR_DEPARTMENT_OTHER): Payer: Self-pay | Admitting: *Deleted

## 2024-11-19 NOTE — Telephone Encounter (Signed)
"  ° °  Pre-operative Risk Assessment    Patient Name: [name]  DOB: July 24, 1946 MRN: [id number]   Date of last office visit: 07/08/2024 with Dr. Lavona  Date of next office visit: None  Request for Surgical Clearance    Procedure:  bilateral mastectomy surgery   Date of Surgery:  Clearance TBD                                 Surgeon:  Vicenta Poli, MD Surgeon's Group or Practice Name:  [facility]  Phone number:  [phone] Fax number:  [phone] or [phone] - Dickey Friedenbach, CMA   Type of Clearance Requested:   - Medical  - Pharmacy:  Hold Clopidogrel  (Plavix ) -needs instructions   Type of Anesthesia:  General    Additional requests/questions:  None  Signed, Patrcia Iverson CROME   11/19/2024, 12:42 PM   "

## 2024-11-19 NOTE — Telephone Encounter (Signed)
" ° °  Name: [name]  DOB: May 06, 1946  MRN: [id number]  Primary Cardiologist: [name], MD   Preoperative team, please contact this patient and set up a phone call appointment for further preoperative risk assessment. Please obtain consent and complete medication review. Thank you for your help.  I confirm that guidance regarding antiplatelet and oral anticoagulation therapy has been completed and, if necessary, noted below: - He has a history of CAD s/p remote stenting to LAD in 2009. Currently on Plavix  monotherapy. Ok to hold Plavix  but should ideally be on Aspirin  while off this if okay with careers adviser.   I also confirmed the patient resides in the state of [place] . As per [facility] Medical Board telemedicine laws, the patient must reside in the state in which the provider is licensed.   Nirvan Laban E Nikol Lemar, PA-C 11/19/2024, 1:43 PM [facility]    "

## 2024-11-19 NOTE — Telephone Encounter (Signed)
 Pt has been scheduled tele preop appt 11/24/24. Med rec and consent are done.      Patient Consent for Virtual Visit        [name] has provided verbal consent on 11/19/2024 for a virtual visit (video or telephone).   CONSENT FOR VIRTUAL VISIT FOR:  [name]  By participating in this virtual visit I agree to the following:  I hereby voluntarily request, consent and authorize [facility] and its employed or contracted physicians, physician assistants, nurse practitioners or other licensed health care professionals (the Practitioner), to provide me with telemedicine health care services (the Services) as deemed necessary by the treating Practitioner. I acknowledge and consent to receive the Services by the Practitioner via telemedicine. I understand that the telemedicine visit will involve communicating with the Practitioner through live audiovisual communication technology and the disclosure of certain medical information by electronic transmission. I acknowledge that I have been given the opportunity to request an in-person assessment or other available alternative prior to the telemedicine visit and am voluntarily participating in the telemedicine visit.  I understand that I have the right to withhold or withdraw my consent to the use of telemedicine in the course of my care at any time, without affecting my right to future care or treatment, and that the Practitioner or I may terminate the telemedicine visit at any time. I understand that I have the right to inspect all information obtained and/or recorded in the course of the telemedicine visit and may receive copies of available information for a reasonable fee.  I understand that some of the potential risks of receiving the Services via telemedicine include:  Delay or interruption in medical evaluation due to technological equipment failure or disruption; Information transmitted may not be sufficient (e.g. poor  resolution of images) to allow for appropriate medical decision making by the Practitioner; and/or  In rare instances, security protocols could fail, causing a breach of personal health information.  Furthermore, I acknowledge that it is my responsibility to provide information about my medical history, conditions and care that is complete and accurate to the best of my ability. I acknowledge that Practitioner's advice, recommendations, and/or decision may be based on factors not within their control, such as incomplete or inaccurate data provided by me or distortions of diagnostic images or specimens that may result from electronic transmissions. I understand that the practice of medicine is not an exact science and that Practitioner makes no warranties or guarantees regarding treatment outcomes. I acknowledge that a copy of this consent can be made available to me via my patient portal [facility] MyChart), or I can request a printed copy by calling the office of  [facility].    I understand that my insurance will be billed for this visit.   I have read or had this consent read to me. I understand the contents of this consent, which adequately explains the benefits and risks of the Services being provided via telemedicine.  I have been provided ample opportunity to ask questions regarding this consent and the Services and have had my questions answered to my satisfaction. I give my informed consent for the services to be provided through the use of telemedicine in my medical care

## 2024-11-19 NOTE — Telephone Encounter (Signed)
 Pt has been scheduled tele preop appt 11/24/24. Med rec and consent are done.

## 2024-11-22 ENCOUNTER — Ambulatory Visit

## 2024-11-22 DIAGNOSIS — M6281 Muscle weakness (generalized): Secondary | ICD-10-CM

## 2024-11-22 DIAGNOSIS — R293 Abnormal posture: Secondary | ICD-10-CM

## 2024-11-22 DIAGNOSIS — C50412 Malignant neoplasm of upper-outer quadrant of left female breast: Secondary | ICD-10-CM

## 2024-11-22 DIAGNOSIS — Z483 Aftercare following surgery for neoplasm: Secondary | ICD-10-CM

## 2024-11-22 NOTE — Assessment & Plan Note (Signed)
 pT2N0M0, stage IA,G2 invasive lobular carcinoma, triple positive -Discovered during screening mammogram. 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  4 sentinel lymph nodes were negative.  Margins were negative for invasive carcinoma, but positive for LCIS. -I recommend adjuvant chemotherapy and anti-HER2 therapy TCH every 3 weeksX6 followed by trastuzumab maintenance therapy to complete a 1 year therapy. She started on 08/25/2024

## 2024-11-22 NOTE — Therapy (Signed)
 " OUTPATIENT PHYSICAL THERAPY BREAST CANCER POST OP FOLLOW UP   Patient Name: [name] MRN: [id number] DOB:05/24/1946, 79 y.o., female Today's Date: 11/22/2024  END OF SESSION:  PT End of Session - 11/22/24 0951     Visit Number 11    Number of Visits 16    Date for Recertification  12/08/24    PT Start Time 0953    PT Stop Time 1054    PT Time Calculation (min) 61 min    Activity Tolerance Patient tolerated treatment well    Behavior During Therapy [facility] for tasks assessed/performed              Past Medical History:  Diagnosis Date   Arthritis    OA   Atrial flutter (HCC)    a. s/p TEE/DCCV 08/16/13 (normal LV function, no LAA thrombus).b. s/p ablation by Dr Waddell 09-23-2013   Coronary artery disease    2009 LAD Promus stent   Dysrhythmia    Aflutter, no issues after ablation 2014   Fibroid    Hyperlipidemia    Hypertension    Insulin  pump in place    Osteopenia    Type 1 diabetes mellitus on insulin  therapy (HCC)    Valvular heart disease    a. Mild MR/TR by TEE 08/2013.   Past Surgical History:  Procedure Laterality Date   ABLATION  09/23/2013   CTI by Dr Waddell   APPENDECTOMY     APPLICATION OF WOUND VAC Left 04/11/2017   Procedure: APPLICATION OF WOUND VAC LEFT KNEE;  Surgeon: Fidel Rogue, MD;  Location: [facility] OR;  Service: Orthopedics;  Laterality: Left;   ATRIAL FLUTTER ABLATION N/A 09/23/2013   Procedure: ATRIAL FLUTTER ABLATION;  Surgeon: Danelle LELON Waddell, MD;  Location: [facility] CATH LAB;  Service: Cardiovascular;  Laterality: N/A;   CARDIOVERSION N/A 08/16/2013   Procedure: CARDIOVERSION;  Surgeon: Aleene JINNY Passe, MD;  Location: [facility] ENDOSCOPY;  Service: Cardiovascular;  Laterality: N/A;  spoke with Tom   CATARACT EXTRACTION Bilateral    COLONOSCOPY     CORONARY ANGIOPLASTY WITH STENT PLACEMENT     EYE SURGERY     Cataract   LASER ABLATION Right 11/20/2023   Right leg laser ablation of the Greater saphenous vein with >20 stab phlebectomies    MYOMECTOMY  1977   ORIF PATELLA Left 04/11/2017   ORIF PATELLA Left 04/11/2017   Procedure: OPEN REDUCTION INTERNAL (ORIF) FIXATION PATELLA LEFT KNEE;  Surgeon: Fidel Rogue, MD;  Location: [facility] OR;  Service: Orthopedics;  Laterality: Left;   PELVIC LAPAROSCOPY     PORTACATH PLACEMENT Right 07/27/2024   Procedure: INSERTION OF TUNNELED CENTRAL VENOUS DEVICE WITH PORT;  Surgeon: Vernetta Berg, MD;  Location: [facility];  Service: General;  Laterality: Right;  PORT-A-CATH INSERTION WITH ULTRASOUND GUIDANCE   SENTINEL NODE BIOPSY Left 07/27/2024   Procedure: LEFT SENTINEL LYMPH NODE BIOPSY;  Surgeon: Vernetta Berg, MD;  Location: [facility];  Service: General;  Laterality: Left;  SENTINEL NODE BIOPSY   TEE WITHOUT CARDIOVERSION N/A 08/16/2013   Procedure: TRANSESOPHAGEAL ECHOCARDIOGRAM (TEE);  Surgeon: Aleene JINNY Passe, MD;  Location: [facility] ENDOSCOPY;  Service: Cardiovascular;  Laterality: N/A;   TUBAL LIGATION  1988   Patient Active Problem List   Diagnosis Date Noted   Atrial flutter (HCC) 07/05/2024   Malignant neoplasm of upper-outer quadrant of left breast in female, estrogen receptor positive (HCC) 06/28/2024   Acute lateral meniscal tear, right, initial encounter 09/02/2023   Varicose veins of  both lower extremities with pain 07/04/2023   Arthritis of carpometacarpal [facility]) joint of both thumbs 01/17/2022   Bilateral hearing loss 01/08/2022   Hypertension 05/13/2019   Urinary incontinence 01/19/2019   Valvular heart disease    Insulin  pump in place    Fibroid    Osteoarthritis    Mixed diabetic hyperlipidemia associated with type 1 diabetes mellitus (HCC) 03/16/2015   Type 1 diabetes mellitus with complication (HCC) 05/12/2013   Chronic insomnia 03/25/2012   Osteopenia    Atherosclerosis of native coronary artery of native heart with angina pectoris 04/05/2011    REFERRING PROVIDER: Dr. Vicenta Poli  REFERRING DIAG: Left breast  cancer  THERAPY DIAG:  Muscle weakness (generalized)  Aftercare following surgery for neoplasm  Abnormal posture  Malignant neoplasm of upper-outer quadrant of left breast in female, estrogen receptor positive (HCC)  Rationale for Evaluation and Treatment: Rehabilitation  ONSET DATE: 07/27/2024  SUBJECTIVE:                                                                                                                                                                                           SUBJECTIVE STATEMENT: I did OK after last visit, but I was tired. I had to rest in the afternoon. Today I am dragging for some reason. I got something in my eye last night but I have used drops and it is still bothering me today. I have some pain in my right lateral shoulder, but its not related to exercises. I worked hard on Sat. Taking down the tree and all and then I cooked some. I saw the Dr. On Friday and I have decided to do a double mastectomy around the end of Feb/1st of March. 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 She underwent a left lumpectomy and sentinel node biopsy (4 negative nodes) on 07/27/2024. It is triple positive with a Ki67 of 3%. She is currently undergoing chemotherapy. Pt is unsure if she will need radiation.    PATIENT GOALS:  Reassess how my recovery is going related to arm function, pain, and swelling.  PAIN:  Are you having pain? Yes: NPRS scale: 6/10 Pain location: lateral right UE Pain description: nagging pain Aggravating factors: putting things overhead. Relieving factors: advil  PRECAUTIONS: Recent Surgery, left UE Lymphedema risk  RED FLAGS: None   ACTIVITY LEVEL / LEISURE: walked the dogs a couple of block on Wednesday, has not been going to the fitness center lately due to fatigue   OBJECTIVE:   PATIENT SURVEYS:  QUICK DASH:     OBSERVATIONS: Healing lumpectomy scar  but  increased scar tissue palpable  POSTURE:  Increased kyphosis, forward head  LYMPHEDEMA ASSESSMENT:   UPPER EXTREMITY AROM/PROM:   A/PROM RIGHT   eval    Shoulder extension 50  Shoulder flexion 147  Shoulder abduction 151  Shoulder internal rotation 72  Shoulder external rotation 43                          (Blank rows = not tested)   A/PROM LEFT   eval LEFT 10/11/2024 LEFT 11/10/24  Shoulder extension 51 74 60  Shoulder flexion 135 162 160  Shoulder abduction 157 160 165  Shoulder internal rotation 57 65 65  Shoulder external rotation 79 78 72                          (Blank rows = not tested)   CERVICAL AROM: All within normal limits:      Percent limited  Flexion WNL  Extension WFL  Right lateral flexion 50% limited  Left lateral flexion 75% limited  Right rotation 50% limited  Left rotation           50% limited      UPPER EXTREMITY STRENGTH: WFL   LYMPHEDEMA ASSESSMENTS (in cm):    LANDMARK RIGHT   eval RIGHT 10/11/2024  10 cm proximal to olecranon process 27.7 26.1  Olecranon process 26.5 25.5  10 cm proximal to ulnar styloid process 22 20  Just proximal to ulnar styloid process 17.4 16.7  Across hand at thumb web space 18.8 19.5  At base of 2nd digit 6.9 6.7  (Blank rows = not tested)   LANDMARK LEFT   eval LEFT 10/11/2024  10 cm proximal to olecranon process 27.2 25.7  Olecranon process 26.1 25  10  cm proximal to ulnar styloid process 21.9 20.7  Just proximal to ulnar styloid process 17.7 16.9  Across hand at thumb web space 19.4 19.6  At base of 2nd digit 6.8 6.6  (Blank rows = not tested)  LOWER EXTREMITY MMT:  MMT Right eval RIGHT 11/10/24  Hip flexion 3+ 5/5  Hip extension 3   Hip abduction 4+   Hip adduction    Hip internal rotation    Hip external rotation    Knee flexion 5   Knee extension 5   Ankle dorsiflexion 5   Ankle plantarflexion    Ankle inversion    Ankle eversion     (Blank rows = not tested)  MMT LEFT eval  LEFT 11/10/24  Hip flexion 3+ 5/5  Hip extension 3   Hip abduction 4+   Hip adduction    Hip internal rotation    Hip external rotation    Knee flexion 4   Knee extension 5   Ankle dorsiflexion 4+   Ankle plantarflexion    Ankle inversion    Ankle eversion     (Blank rows = not tested)  Surgery type/Date: 07/27/2024 left lumpectomy and sentinel node biopsy Number of lymph nodes removed: 4 Current/past treatment (chemo, radiation, hormone therapy): chemotherapy Other symptoms:  Heaviness/tightness No Pain No Pitting edema No Infections No Decreased scar mobility Yes Stemmer sign No  30 SEC SIT TO STAND: 10 reps which is below average for her age  SINGLE LIMB STANCE:  11/10/24: R - 2 sec, L 6 sec  At eval: R - 5 seconds, L 8 seconds  GAIT: Shortened step length bilaterally, foot flat, unsteady at time  but no losses of balance  TANDEM STANCE:  11/11/23: 9 sec At eval:Tandem with R foot in from 3 sec, with L foot in front 8 seconds  TREATMENT PERFORMED:  11/22/2024  Nu step seat 10, UE 9 L4 x 10 min, 815 steps Sit to stand from high table with red plyoball overhead press and slow lower, 2 x 5 Incline stretch x 3, 20 sec Ax beam: 6  lengths forward walking with intermittent hand touch prn for balance 6 Lengths sidestepping with intermittent hand hold Tandem stance x 2 B to failure Stepping over hurdles forward 6 lengths, sideways 6 lengths; intermittent hand touch Dual cable retraction 3 # 2 x 10 with emphasis on proper posture ADD DF with band next 11/17/2024 Sit to stand from chair x 2 but difficult without UeEs Sit to stand from high table with red plyoball overhead press and slow lower, 3 x 5 Nu step seat 10, UE 9 L4 x 10 min, 965 steps Incline stretch x 3, 20 sec Dual cable retraction 3 # 1 x 10 with feet in staggered stance, x 10 with near tandem stance, x 5 near tandem Ambulated to ortho gym and went up and down 4 steps with bilateral rails with slow lowering x  3 Leg Press 85# B 2 x 10 reps 4 hurdles in //bars; forward walking 6 lengths in foot in between, sidestepping both feet between 4 lengths with light HH HS stretch seated x 3 ea, 20 sec 11/15/2024 NuStep level 4, seat at 10, arms at 9 x 10 min, 905  Dual cable machine: 3 lbs for scap retraction x 10 reps with pt returning therapist demo, then x 5 ea in half tandem stance Incline stretch x 3, 20 secs Standing march in // bars x 10 ea Standing heel raises x 10 ea Ax beam x 6 lengths forward, x 4 lengths sidestepping with intermittent hand hold, and encouragement to focus on object, seated rest SLS on blue foam 3 D SLR;flex, abd ext x 10 ea with intermittent HH Tandem stance B x 3 ea to failure, more difficult with Left leg forward. Bilateral HS stretch x 2 ea., 20 seconds  11/10/24 Assessed progress towards all goals in therapy - assessed strength and balance NuStep level 4, seat at 10, arms at 9 x 10 min - 773 steps  Tandem walking on air ex foam x 4 with bilateral HHA with increased fatigue requiring seated recovery period afterwards  Attempted 3 way hip on purple foam but pt became very fatigued and short of breath after 10 reps of L hip flexion and required another seated recovery periods due to fatigue and shortness of breath Self care- discussed how chemo effects the body and how it is important to listen to the body such as dizziness and shortness of breath and take recovery periods as needed and not to push during the time right after chemo. Also educated pt how balance is effected by chemo.   11/03/2024 NuStep level 4, seat at 10, arms at 9 x 10 min - 952 steps  Dual cable machine: 3 lbs for scap retraction x 10 reps with pt returning therapist demo Leg press seat at 7, 70 lbs 1 x 580 lbs- 1 sets of 10 reps, 85 lbs 1 x 10 reps Bilateral HS stretch seated 3 x 20 sec 3 way hip machine: 25 lbs in flexion, abduction and extension x 10 each bilaterally with pt returning therapist demo Ball  squeeze 5 sec hold x  10 Tandem stance in// bars; 2 B, 30 sec with intermittent HH  And VC's for posture and core stab  11/01/24:  Therapeutic Exercise: Seated hamstring stretch x 30 sec holds x 2 reps bilaterally with hands on hips to keep from rounded back NuStep level 4, seat at 10, arms at 9 x 10 min - steps 835 3 way hip machine: 25 lbs in flexion, abduction and extension x 10 each bilaterally with pt returning therapist demo Leg press seat at 7, 70 lbs- 2 sets of 10 reps Dual cable machine: 3 lbs for scap retraction x 10 reps with pt returning therapist demo Therapeutic Activity: Bridging x 10 reps with 5 sec holds with v/c to engage core throughout and not just push up with glutes Pelvic tilts x 10 reps with 5 sec holds with pt demonstrating good form Seated on red disc for core engagement: TKE with 2lb ankle weights with 3 sec holds x 10 reps bilaterally  Seated hip flexion with 2 lb ankle weights x 10 with v/c to not lean back Ball squeezes with 5 sec holds x 10   10/21/24:  Therapeutic Exercise: Seated hamstring stretch x 30 sec holds x 2 reps bilaterally with hands on hips to keep from rounded back NuStep level 4, seat at 10, arms at 9 (no arms half way through due to hx of SLNB) x 10 min - steps 940 Therapeutic Activity: Bridging x 12 reps with 5 sec holds with pt demonstrating good form today and no hamstring cramping Pelvic tilts x 10 reps with 5 sec holds with pt demonstrating good form Scapular retraction with v/c to pull back and down x 10 reps with yellow band and cues for upright posture and keeping elbows close Seated on red disc for core engagement: TKE with 2lb ankle weights with 3 sec holds x 10 reps bilaterally  Seated hip flexion with 2 lb ankle weights x 10 with v/c to not lean back Ball squeezes with 5 sec holds x 13  Hip abduction x 10 reps with green theraband Neuro reed: Standing on air ex with occasional HHA for balance: hip flexion x 10 reps bilaterally  then hip extension x 10 bilaterally with more difficulty with balance, hip abduction x 10 bilaterally  Tandem walking x 4 on air ex beam with occasional hand held assist  10/19/24:  Therapeutic Exercise: Seated hamstring stretch x 30 sec holds x 2 reps bilaterally with v/c to keep from rounding back TKE with 2lb ankle weights with 3 sec holds x 10 reps bilaterally  Seated hip flexion with 2 lb ankle weights x 10 with v/c to not lean back NuStep level 4, seat at 10, arms at 9 (no arms half way through due to hx of SLNB) x 10 min - steps 870 Therapeutic Activity: Bridging x 10 reps with 5 sec holds with pt demonstrating good form today and no hamstring cramping Pelvic tilts x 10 reps with 5 sec holds with pt demonstrating good form Scapular retraction with v/c to pull back and down x 10 reps with yellow band and cues for upright posture and keeping elbows close Neuro reed: Standing on air ex with 1 HHA: hip flexion x 10 reps bilaterally then hip extension with v/c to keep knee straight with pt having difficulty clearing mat due to inability to hip hike  10/12/24:  Therapeutic Exercise: Seated hamstring stretch x 30 sec holds x 2 reps bilaterally with v/c to keep from rounding back TKE with 2lb ankle  weights with 3 sec holds x 10 reps bilaterally  Seated hip flexion with 2 lb ankle weights x 10 with v/c to not lean back NuStep level 3, seat at 10, arms at 9 (using arms 2 min on/2 min off due to hx of SLNB) x 10 min Therapeutic Activity: Bridging x 10 reps with v/c and t/c to use the core and engage core throughout to avoid hamstring cramping Pelvic tilts x 10 reps with 5 sec holds with v/c and t/c for proper form Scapular retraction with v/c to pull back and down x 10 reps with red band   PATIENT EDUCATION:  Education details: how chemo effects the body including fatigue and balance and importance of not pushing through in the time right after chemo Person educated: Patient Education  method: Explanation Education comprehension: verbalized understanding  HOME EXERCISE PROGRAM: Reviewed previously given post op HEP. Practice sit to stands from chair without use of UEs  Access Code: [email or web address] URL: [email or web address] Date: 10/12/2024 Prepared by: Florina Lanis Carbon  Exercises - Seated Hamstring Stretch  - 1 x daily - 7 x weekly - 1 sets - 2 reps - 30 sec hold - Seated Hip Flexion March with Ankle Weights  - 1 x daily - 7 x weekly - 1 sets - 10 reps - Seated Long Arc Quad with Ankle Weight  - 1 x daily - 7 x weekly - 1 sets - 10 reps - 3 sec hold - Supine Bridge  - 1 x daily - 7 x weekly - 1 sets - 10 reps - Supine Posterior Pelvic Tilt  - 1 x daily - 7 x weekly - 1-3 sets - 10 reps - 5 sec hold - Scapular Retraction with Resistance  - 1 x daily - 7 x weekly - 1 sets - 10 reps   ASSESSMENT:  CLINICAL IMPRESSION: Pt came in tired today from the weekend so advised to slow down some on nu step. She felt a little better after exercising but still felt tired.  She had no shoulder pain with exercises in clinic and did an excellent job with posture when doing Free motion scapular retraction. Balance is still challenging for her, however she improves with attention to task.  Pt will benefit from skilled therapeutic intervention to improve on the following deficits: Decreased knowledge of precautions, impaired UE functional use, pain, decreased ROM, postural dysfunction.   PT treatment/interventions: ADL/Self care home management, [phone]- PT Re-evaluation, 97110-Therapeutic exercises, 97530- Therapeutic activity, [id number]- Neuromuscular re-education, 97535- Self Care, [id number]- Manual therapy, [phone]- Gait training, and Patient/Family education   GOALS: Goals reviewed with patient? Yes  GOALS MET AT EVAL:  GOALS Name Target Date Goal status  1 Pt will be able to verbalize understanding of pertinent lymphedema risk reduction practices relevant to her dx  specifically related to skin care.  Baseline:  No knowledge Eval Achieved at eval  2 Pt will be able to return demo and/or verbalize understanding of the post op HEP related to regaining shoulder ROM. Baseline:  No knowledge Eval Achieved at eval  3 Pt will be able to verbalize understanding of the importance of viewing the post op After Breast CA Class video for further lymphedema risk reduction education and therapeutic exercise.  Baseline:  No knowledge Eval Achieved at eval   LONG TERM GOALS:  (STG=LTG)  GOALS Name Target Date  Goal status  1 Pt will demonstrate she has regained full shoulder ROM and function post operatively compared to baselines.  Baseline: 08/25/2024  MET 11/10/24  2 Pt will demonstrate at least 4/5 bilateral hip flexion strength to decrease fall risk. 11/08/24 MET 11/10/24  3 Pt will be able to complete SLS bilaterally for at least 15 sec to decrease fall risk. 11/08/24 ONGOING 11/10/24  4 Pt will be able to stand in tandem stance for 15 sec with no losses of balance to decrease fall risk. 11/08/24 ONGOING 11/10/24  5 Pt will be independent in a home exercise program for continued stretching and strengthening.  11/08/24 ONGOING 11/10/24     PLAN:  PT FREQUENCY/DURATION: 2x/wk for 4 weeks  PLAN FOR NEXT SESSION: DF strength with TB especially for left,how are effects from chemo?  NuStep, hip strengthening, balance exercises, air ex beam, hamstring stretching, posture exercises, give post op handout   [facility]  [address], [address]  [place] [address]  [phone]  After Breast Cancer Class Video It is recommended you view the ABC class video to be educated on lymphedema risk reduction. This video lasts for about 30 minutes. It can be viewed on our website here: [email or web address]  Scar massage You can begin gentle scar massage to you incision sites. Gently place one hand on the  incision and move the skin (without sliding on the skin) in various directions. Do this for a few minutes and then you can gently massage either coconut oil or vitamin E cream into the scars.  Compression garment You should continue wearing your compression bra until you feel like you no longer have swelling.  Home exercise Program Continue doing the exercises you were given until you feel like you can do them without feeling any tightness at the end.   Walking Program Studies show that 30 minutes of walking per day (fast enough to elevate your heart rate) can significantly reduce the risk of a cancer recurrence. If you can't walk due to other medical reasons, we encourage you to find another activity you could do (like a stationary bike or water exercise).  Posture After breast cancer surgery, people frequently sit with rounded shoulders posture because it puts their incisions on slack and feels better. If you sit like this and scar tissue forms in that position, you can become very tight and have pain sitting or standing with good posture. Try to be aware of your posture and sit and stand up tall to heal properly.  Follow up PT: It is recommended you return every 3 months for the first 3 years following surgery to be assessed on the SOZO machine for an L-Dex score. This helps prevent clinically significant lymphedema in 95% of patients. These follow up screens are 10 minute appointments that you are not billed for.  Grayce JINNY Sheldon, PT 11/22/2024, 10:59 AM  "

## 2024-11-23 ENCOUNTER — Encounter: Payer: Self-pay | Admitting: *Deleted

## 2024-11-23 ENCOUNTER — Inpatient Hospital Stay

## 2024-11-23 ENCOUNTER — Inpatient Hospital Stay: Admitting: Hematology

## 2024-11-23 VITALS — BP 137/63 | HR 85 | Temp 98.2°F | Resp 17 | Ht 69.0 in | Wt 187.1 lb

## 2024-11-23 DIAGNOSIS — Z5111 Encounter for antineoplastic chemotherapy: Secondary | ICD-10-CM | POA: Diagnosis not present

## 2024-11-23 DIAGNOSIS — C50412 Malignant neoplasm of upper-outer quadrant of left female breast: Secondary | ICD-10-CM

## 2024-11-23 DIAGNOSIS — Z170 Estrogen receptor positive status [ER+]: Secondary | ICD-10-CM | POA: Diagnosis not present

## 2024-11-23 LAB — CMP (CANCER CENTER ONLY)
ALT: 34 U/L (ref 0–44)
AST: 23 U/L (ref 15–41)
Albumin: 3.9 g/dL (ref 3.5–5.0)
Alkaline Phosphatase: 92 U/L (ref 38–126)
Anion gap: 10 (ref 5–15)
BUN: 15 mg/dL (ref 8–23)
CO2: 25 mmol/L (ref 22–32)
Calcium: 9.3 mg/dL (ref 8.9–10.3)
Chloride: 104 mmol/L (ref 98–111)
Creatinine: 0.83 mg/dL (ref 0.44–1.00)
GFR, Estimated: >60 mL/min
Glucose, Bld: 188 mg/dL — ABNORMAL HIGH (ref 70–99)
Potassium: 4.4 mmol/L (ref 3.5–5.1)
Sodium: 139 mmol/L (ref 135–145)
Total Bilirubin: 0.3 mg/dL (ref 0.0–1.2)
Total Protein: 6.1 g/dL — ABNORMAL LOW (ref 6.5–8.1)

## 2024-11-23 LAB — CBC WITH DIFFERENTIAL (CANCER CENTER ONLY)
Abs Immature Granulocytes: 0.07 K/uL (ref 0.00–0.07)
Basophils Absolute: 0 K/uL (ref 0.0–0.1)
Basophils Relative: 0 %
Eosinophils Absolute: 0 K/uL (ref 0.0–0.5)
Eosinophils Relative: 0 %
HCT: 30.4 % — ABNORMAL LOW (ref 36.0–46.0)
Hemoglobin: 10.2 g/dL — ABNORMAL LOW (ref 12.0–15.0)
Immature Granulocytes: 1 %
Lymphocytes Relative: 6 %
Lymphs Abs: 0.6 K/uL — ABNORMAL LOW (ref 0.7–4.0)
MCH: 32.4 pg (ref 26.0–34.0)
MCHC: 33.6 g/dL (ref 30.0–36.0)
MCV: 96.5 fL (ref 80.0–100.0)
Monocytes Absolute: 0.5 K/uL (ref 0.1–1.0)
Monocytes Relative: 5 %
Neutro Abs: 8.8 K/uL — ABNORMAL HIGH (ref 1.7–7.7)
Neutrophils Relative %: 88 %
Platelet Count: 270 K/uL (ref 150–400)
RBC: 3.15 MIL/uL — ABNORMAL LOW (ref 3.87–5.11)
RDW: 16.1 % — ABNORMAL HIGH (ref 11.5–15.5)
WBC Count: 10 K/uL (ref 4.0–10.5)
nRBC: 0 % (ref 0.0–0.2)

## 2024-11-23 MED ORDER — SODIUM CHLORIDE 0.9 % IV SOLN
404.3600 mg | Freq: Once | INTRAVENOUS | Status: AC
  Administered 2024-11-23: 400 mg via INTRAVENOUS
  Filled 2024-11-23: qty 40

## 2024-11-23 MED ORDER — TRASTUZUMAB-DTTB CHEMO 150 MG IV SOLR
6.0000 mg/kg | Freq: Once | INTRAVENOUS | Status: AC
  Administered 2024-11-23: 525 mg via INTRAVENOUS
  Filled 2024-11-23: qty 25

## 2024-11-23 MED ORDER — ACETAMINOPHEN 325 MG PO TABS
650.0000 mg | ORAL_TABLET | Freq: Once | ORAL | Status: AC
  Administered 2024-11-23: 650 mg via ORAL
  Filled 2024-11-23: qty 2

## 2024-11-23 MED ORDER — DEXAMETHASONE SOD PHOSPHATE PF 10 MG/ML IJ SOLN
10.0000 mg | Freq: Once | INTRAMUSCULAR | Status: AC
  Administered 2024-11-23: 10 mg via INTRAVENOUS
  Filled 2024-11-23: qty 1

## 2024-11-23 MED ORDER — DIPHENHYDRAMINE HCL 25 MG PO CAPS
25.0000 mg | ORAL_CAPSULE | Freq: Once | ORAL | Status: AC
  Administered 2024-11-23: 25 mg via ORAL
  Filled 2024-11-23: qty 1

## 2024-11-23 MED ORDER — APREPITANT 130 MG/18ML IV EMUL
130.0000 mg | Freq: Once | INTRAVENOUS | Status: AC
  Administered 2024-11-23: 130 mg via INTRAVENOUS
  Filled 2024-11-23: qty 18

## 2024-11-23 MED ORDER — SODIUM CHLORIDE 0.9 % IV SOLN: INTRAVENOUS | Status: DC

## 2024-11-23 MED ORDER — SODIUM CHLORIDE 0.9 % IV SOLN
75.0000 mg/m2 | Freq: Once | INTRAVENOUS | Status: AC
  Administered 2024-11-23: 160 mg via INTRAVENOUS
  Filled 2024-11-23: qty 16

## 2024-11-23 MED ORDER — PALONOSETRON HCL INJECTION 0.25 MG/5ML
0.2500 mg | Freq: Once | INTRAVENOUS | Status: AC
  Administered 2024-11-23: 0.25 mg via INTRAVENOUS
  Filled 2024-11-23: qty 5

## 2024-11-23 NOTE — Progress Notes (Signed)
 " [facility]   [phone] [phone]   Clinic Follow up Note   Patient Care Team: Rollene Almarie LABOR, MD as PCP - General (Internal Medicine) Lavona Agent, MD as PCP - Cardiology (Cardiology) [name], Aleene PARAS, MD (Inactive) (Cardiology) Dwane Lerner, MD (Dermatology) Waddell Danelle ORN, MD (Cardiology) Obie Princella HERO, MD (Inactive) (Gastroenterology) Claudene Arthea HERO, DO (Sports Medicine) Oman, Heather, OD as Consulting Physician (Optometry) Szabat, Toribio BROCKS, [facility] (Inactive) (Pharmacist) [name], Pasco RAMAN, DPM as Consulting Physician (Podiatry) Thapa, Sudan, MD as Consulting Physician (Endocrinology) Gerome, Devere HERO, RN as Oncology Nurse Navigator Tyree Nanetta SAILOR, RN as Oncology Nurse Navigator Vernetta Berg, MD as Consulting Physician (General Surgery) Lanny Callander, MD as Consulting Physician (Hematology) Dewey Rush, MD as Consulting Physician (Radiation Oncology)  Date of Service:  11/23/2024  CHIEF COMPLAINT: f/u of left breast cancer   CURRENT THERAPY:  Adjuvant chemotherapy TCH every 3 weeks  Oncology History   Malignant neoplasm of upper-outer quadrant of left breast in female, estrogen receptor positive (HCC) pT2N0M0, stage IA,G2 invasive lobular carcinoma, triple positive -Discovered during screening mammogram. 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  4 sentinel lymph nodes were negative.  Margins were negative for invasive carcinoma, but positive for LCIS. -I recommend adjuvant chemotherapy and anti-HER2 therapy TCH every 3 weeksX6 followed by trastuzumab  maintenance therapy to complete a 1 year therapy. 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  Status post lumpectomy for a 3.2 cm tumor with negative lymph nodes. Currently on cycle 5 of 6 of chemotherapy with concurrent HER2 antibody therapy. Mastectomy with reconstruction is planned post-chemotherapy due to positive LCIS margins, with ongoing coordination between breast and plastic surgery. HER2 antibody therapy will continue for one year in total, and anti-estrogen oral therapy is planned for 5-10 years post-surgery. Cardiac monitoring is ongoing due to HER2 therapy; recent echocardiogram showed mild atrial enlargement with preserved function. Radiation therapy is not anticipated given negative lymph nodes and tumor size, but final decision will depend on post-mastectomy pathology. She is considering switching to subcutaneous HER2 antibody injections if insurance allows, which may permit port removal during surgery. She is tolerating therapy well, with manageable fatigue and improved appetite. - Administered cycle 5 of chemotherapy during this visit. - Planned cycle 6 of chemotherapy in three weeks. - Continue HER2 antibody therapy every three weeks for a total of one year - Scheduled echocardiogram for ongoing cardiac monitoring during HER2 therapy. - Coordinated with cardiology for evaluation of mild atrial enlargement. - Planned mastectomy with reconstruction after completion of chemotherapy; ongoing coordination with plastic surgery. - Planned anti-estrogen oral therapy for 5-10 years following surgery. - Discussed possible removal of port during breast surgery if insurance approves subcutaneous HER2 antibody injection; will verify insurance coverage. - Will confirm with radiation oncology regarding need for radiation post-mastectomy, though not anticipated given negative lymph nodes and tumor size. - Send breast tissue for pathology after mastectomy to determine need for radiation if invasive cancer is found. - Provided education regarding treatment sequence, rationale for therapy choices,  and anticipated side effects of HER2 antibody therapy including diarrhea, mild rash, and cardiac effects.  Constipation secondary to cancer therapy She experiences intermittent constipation associated with chemotherapy and antiemetic use (Aloxi , Zofran ), managed with dietary  modifications. - Advised proactive management with prune juice and apples, especially following chemotherapy and antiemetic administration. - Educated on constipation as a side effect of chemotherapy and antiemetics.  Vaginal dryness secondary to cancer therapy She reports intermittent vaginal pruritus without discharge, likely secondary to vaginal dryness from cancer therapy. Symptoms have improved with chemotherapy dose reduction. Infection is unlikely given absence of discharge. - Recommended gynecology evaluation for symptomatic management. - Advised against estrogen-containing creams due to estrogen receptor positivity of her tumor. - Suggested discussion with gynecology regarding non-estrogen topical therapies.  Plan - Lab reviewed, adequate for treatment, will proceed with same dose cycle 5 TCH today - She will return in 3 weeks for last dose chemo - She plans to have double mastectomy and breast reconstruction after chemotherapy - Follow-up in 3 weeks - Patient had multiple questions about her overall treatment plan, I answered all to her satisfaction.  SUMMARY OF ONCOLOGIC HISTORY: Oncology History  Malignant neoplasm of upper-outer quadrant of left breast in female, estrogen receptor positive (HCC)  06/22/2024 Cancer Staging   Staging form: Breast, AJCC 8th Edition - Clinical stage from 06/22/2024: Stage IB (cT2, cN0, cM0, G2, ER+, PR+, HER2+) - Signed by Lanny Callander, MD on 06/30/2024 Stage prefix: Initial diagnosis Histologic grading system: 3 grade system   06/28/2024 Initial Diagnosis   Malignant neoplasm of upper-outer quadrant of left breast in female, estrogen receptor positive (HCC)   07/27/2024 Cancer  Staging   Staging form: Breast, AJCC 8th Edition - Pathologic stage from 07/27/2024: Stage IA (pT2, pN0, cM0, G2, ER+, PR+, HER2+) - Signed by Lanny Callander, MD on 08/08/2024 Stage prefix: Initial diagnosis Histologic grading system: 3 grade system Residual tumor (R): R0   08/25/2024 -  Chemotherapy   Patient is on Treatment Plan : BREAST Docetaxel  + Carboplatin  + Trastuzumab  (TCH) q21d / Trastuzumab  q21d        Discussed the use of AI scribe software for clinical note transcription with the patient, who gave verbal consent to proceed.  History of Present Illness [name] is a 79 year old female with ER-positive, HER2-positive invasive ductal carcinoma of the left breast, status post lumpectomy, who presents for follow-up and management of chemotherapy-related side effects.  She is on cycle 5 of docetaxel -based chemotherapy with concurrent HER2 antibody therapy for a 3.2 cm, node-negative, ER-positive, HER2-positive left breast invasive ductal carcinoma after lumpectomy on July 27, 2024. She has seen plastic surgery and further surgery is pending coordination between teams.  She has intermittent post-chemotherapy constipation related to chemotherapy and antiemetics (Aloxi , Zofran ), which improves with prune juice and apples. She has occasional mild diarrhea, sometimes linked to a specific protein shake or docetaxel . Fatigue is present but manageable. Appetite has improved and she is eating better. She has no current nausea.  She has intermittent vaginal pruritus without discharge, which has improved since chemotherapy dose reduction and is attributed to therapy-related vaginal dryness. She has no symptoms of infection.  She uses the DigniCap scalp cooling system with good hair preservation. Ongoing cardiac monitoring for HER2 therapy includes an echocardiogram on October 05, 2024, showing mild biatrial enlargement with preserved function. She has a planned cardiology phone consultation  to review these findings.     All other systems were reviewed with the patient and are negative.  MEDICAL HISTORY:  Past Medical History:  Diagnosis Date   Arthritis    OA   Atrial flutter (HCC)    a. s/p TEE/DCCV 08/16/13 (normal LV function, no LAA thrombus).b. s/p ablation by  Dr Waddell 09-23-2013   Coronary artery disease    2009 LAD Promus stent   Dysrhythmia    Aflutter, no issues after ablation 2014   Fibroid    Hyperlipidemia    Hypertension    Insulin  pump in place    Osteopenia    Type 1 diabetes mellitus on insulin  therapy (HCC)    Valvular heart disease    a. Mild MR/TR by TEE 08/2013.    SURGICAL HISTORY: Past Surgical History:  Procedure Laterality Date   ABLATION  09/23/2013   CTI by Dr Waddell   APPENDECTOMY     APPLICATION OF WOUND VAC Left 04/11/2017   Procedure: APPLICATION OF WOUND VAC LEFT KNEE;  Surgeon: Fidel Rogue, MD;  Location: [facility] OR;  Service: Orthopedics;  Laterality: Left;   ATRIAL FLUTTER ABLATION N/A 09/23/2013   Procedure: ATRIAL FLUTTER ABLATION;  Surgeon: Danelle LELON Waddell, MD;  Location: [facility] CATH LAB;  Service: Cardiovascular;  Laterality: N/A;   CARDIOVERSION N/A 08/16/2013   Procedure: CARDIOVERSION;  Surgeon: Aleene JINNY Passe, MD;  Location: [facility] ENDOSCOPY;  Service: Cardiovascular;  Laterality: N/A;  spoke with Tom   CATARACT EXTRACTION Bilateral    COLONOSCOPY     CORONARY ANGIOPLASTY WITH STENT PLACEMENT     EYE SURGERY     Cataract   LASER ABLATION Right 11/20/2023   Right leg laser ablation of the Greater saphenous vein with >20 stab phlebectomies   MYOMECTOMY  1977   ORIF PATELLA Left 04/11/2017   ORIF PATELLA Left 04/11/2017   Procedure: OPEN REDUCTION INTERNAL (ORIF) FIXATION PATELLA LEFT KNEE;  Surgeon: Fidel Rogue, MD;  Location: [facility] OR;  Service: Orthopedics;  Laterality: Left;   PELVIC LAPAROSCOPY     PORTACATH PLACEMENT Right 07/27/2024   Procedure: INSERTION OF TUNNELED CENTRAL VENOUS DEVICE WITH PORT;  Surgeon:  Vernetta Berg, MD;  Location: [facility];  Service: General;  Laterality: Right;  PORT-A-CATH INSERTION WITH ULTRASOUND GUIDANCE   SENTINEL NODE BIOPSY Left 07/27/2024   Procedure: LEFT SENTINEL LYMPH NODE BIOPSY;  Surgeon: Vernetta Berg, MD;  Location: [facility];  Service: General;  Laterality: Left;  SENTINEL NODE BIOPSY   TEE WITHOUT CARDIOVERSION N/A 08/16/2013   Procedure: TRANSESOPHAGEAL ECHOCARDIOGRAM (TEE);  Surgeon: Aleene JINNY Passe, MD;  Location: [facility] ENDOSCOPY;  Service: Cardiovascular;  Laterality: N/A;   TUBAL LIGATION  1988    I have reviewed the social history and family history with the patient and they are unchanged from previous note.  ALLERGIES:  is allergic to sulfa antibiotics and sulfamethoxazole-trimethoprim.  MEDICATIONS:  Current Outpatient Medications  Medication Sig Dispense Refill   Calcium  Carbonate-Vitamin D  (CALCIUM  + D PO) Take 600 mg by mouth 2 (two) times daily.     cholecalciferol  (VITAMIN D ) 1000 units tablet Take 1,000 Units by mouth 2 (two) times daily.      clopidogrel  (PLAVIX ) 75 MG tablet TAKE 1 TABLET DAILY 90 tablet 3   Continuous Glucose Sensor (DEXCOM G7 15 DAY SENSOR) MISC by Does not apply route.     cyanocobalamin  (VITAMIN B12) 1000 MCG tablet Take 1,000 mcg by mouth daily.     dexamethasone  (DECADRON ) 4 MG tablet Take 1 tabs by mouth 2 times daily starting day before chemo. Then take 1 tabs daily for 2 days starting day after chemo. Take with food. 30 tablet 1   ezetimibe  (ZETIA ) 10 MG tablet Take 1 tablet (10 mg total) by mouth daily. 90 tablet 3   gabapentin  (NEURONTIN ) 100 MG capsule Take  2 capsules (200 mg total) by mouth at bedtime. 180 capsule 0   Insulin  Lispro-aabc (LYUMJEV ) 100 UNIT/ML SOLN USE A MAXIMUM OF 90 UNITS DAILY VIA INSULIN  PUMP 80 mL 3   lidocaine -prilocaine  (EMLA ) cream Apply to affected area once 30 g 3   losartan  (COZAAR ) 100 MG tablet Take 1 tablet (100 mg total) by mouth daily. 90  tablet 3   metFORMIN  (GLUCOPHAGE -XR) 500 MG 24 hr tablet TAKE 1 TABLET IN THE MORNING AND 2 TABLETS IN THE EVENING AS DIRECTED 270 tablet 3   metoprolol  succinate (TOPROL -XL) 100 MG 24 hr tablet TAKE 1 TABLET DAILY 90 tablet 3   Multiple Vitamins-Minerals (PRESERVISION AREDS 2 PO) Take by mouth in the morning and at bedtime.     nitroGLYCERIN  (NITROSTAT ) 0.4 MG SL tablet DISSOLVE 1 TABLET UNDER THE TONGUE EVERY 5 MINUTES AS NEEDED FOR CHEST PAIN 75 tablet 2   ondansetron  (ZOFRAN ) 8 MG tablet Take 1 tablet (8 mg total) by mouth every 8 (eight) hours as needed for nausea or vomiting. Start on the third day after chemotherapy. 30 tablet 1   prochlorperazine  (COMPAZINE ) 10 MG tablet Take 1 tablet (10 mg total) by mouth every 6 (six) hours as needed for nausea or vomiting. 30 tablet 1   Pyridoxine HCl (VITAMIN B6) 100 MG TABS      rosuvastatin  (CRESTOR ) 40 MG tablet Take 1 tablet (40 mg total) by mouth daily. 90 tablet 3   solifenacin  (VESICARE ) 10 MG tablet TAKE 1 TABLET DAILY 90 tablet 3   spironolactone  (ALDACTONE ) 25 MG tablet Take 1 tablet (25 mg total) by mouth daily. 90 tablet 3   TART CHERRY PO Take by mouth.     traMADol  (ULTRAM ) 50 MG tablet Take 1 tablet (50 mg total) by mouth every 6 (six) hours as needed for moderate pain (pain score 4-6) or severe pain (pain score 7-10). 25 tablet 0   No current facility-administered medications for this visit.   Facility-Administered Medications Ordered in Other Visits  Medication Dose Route Frequency Provider Last Rate Last Admin   0.9 %  sodium chloride  infusion   Intravenous Continuous Lanny Callander, MD 10 mL/hr at 11/23/24 0925 New Bag at 11/23/24 0925   CARBOplatin  (PARAPLATIN ) 400 mg in sodium chloride  0.9 % 250 mL chemo infusion  400 mg Intravenous Once Lanny Callander, MD       DOCEtaxel  (TAXOTERE ) 160 mg in sodium chloride  0.9 % 250 mL chemo infusion  75 mg/m2 (Treatment Plan Recorded) Intravenous Once Lanny Callander, MD       trastuzumab -dttb (ONTRUZANT )  525 mg in sodium chloride  0.9 % 250 mL chemo infusion  6 mg/kg (Treatment Plan Recorded) Intravenous Once Lanny Callander, MD        PHYSICAL EXAMINATION: ECOG PERFORMANCE STATUS: 1 - Symptomatic but completely ambulatory  Vitals:   11/23/24 0800 11/23/24 0857  BP: (!) 142/59 137/63  Pulse: 85   Resp: 17   Temp: 98.2 F (36.8 C)   SpO2: 100%    Wt Readings from Last 3 Encounters:  11/23/24 187 lb 1.6 oz (84.9 kg)  11/11/24 176 lb 3.2 oz (79.9 kg)  11/02/24 190 lb 4.8 oz (86.3 kg)     GENERAL:alert, no distress and comfortable SKIN: skin color, texture, turgor are normal, no rashes or significant lesions EYES: normal, Conjunctiva are pink and non-injected, sclera clear Musculoskeletal:no cyanosis of digits and no clubbing  NEURO: alert & oriented x 3 with fluent speech, no focal motor/sensory deficits  Physical Exam  LABORATORY DATA:  I have reviewed the data as listed    Latest Ref Rng & Units 11/23/2024    8:36 AM 11/02/2024    8:40 AM 10/05/2024    8:33 AM  CBC  WBC 4.0 - 10.5 K/uL 10.0  9.2  11.8   Hemoglobin 12.0 - 15.0 g/dL 89.7  88.7  89.2   Hematocrit 36.0 - 46.0 % 30.4  33.4  32.2   Platelets 150 - 400 K/uL 270  444  435         Latest Ref Rng & Units 11/23/2024    8:36 AM 11/02/2024    8:40 AM 10/05/2024    8:33 AM  CMP  Glucose 70 - 99 mg/dL 811  824  885   BUN 8 - 23 mg/dL 15  19  23    Creatinine 0.44 - 1.00 mg/dL 9.16  9.28  9.05   Sodium 135 - 145 mmol/L 139  140  138   Potassium 3.5 - 5.1 mmol/L 4.4  4.3  4.3   Chloride 98 - 111 mmol/L 104  105  102   CO2 22 - 32 mmol/L 25  25  24    Calcium  8.9 - 10.3 mg/dL 9.3  9.7  9.9   Total Protein 6.5 - 8.1 g/dL 6.1  6.5  6.4   Total Bilirubin 0.0 - 1.2 mg/dL 0.3  0.5  0.3   Alkaline Phos 38 - 126 U/L 92  105  98   AST 15 - 41 U/L 23  29  24    ALT 0 - 44 U/L 34  37  39       RADIOGRAPHIC STUDIES: I have personally reviewed the radiological images as listed and agreed with the findings in the  report. No results found.    No orders of the defined types were placed in this encounter.  All questions were answered. The patient knows to call the clinic with any problems, questions or concerns. No barriers to learning was detected. The total time spent in the appointment was 40 minutes, including review of chart and various tests results, discussions about plan of care and coordination of care plan     Onita Mattock, MD 11/23/2024     "

## 2024-11-23 NOTE — Patient Instructions (Signed)
 [facility] - A DEPT OF [facility]  Discharge Instructions: Thank you for choosing [facility] to provide your oncology and hematology care.   If you have a lab appointment with the Cancer Center, please go directly to the Cancer Center and check in at the registration area.   Wear comfortable clothing and clothing appropriate for easy access to any Portacath or PICC line.   We strive to give you quality time with your provider. You may need to reschedule your appointment if you arrive late (15 or more minutes).  Arriving late affects you and other patients whose appointments are after yours.  Also, if you miss three or more appointments without notifying the office, you may be dismissed from the clinic at the provider's discretion.      For prescription refill requests, have your pharmacy contact our office and allow 72 hours for refills to be completed.    Today you received the following chemotherapy and/or immunotherapy agents trastuzumab, docetaxel, carboplatin      To help prevent nausea and vomiting after your treatment, we encourage you to take your nausea medication as directed.  BELOW ARE SYMPTOMS THAT SHOULD BE REPORTED IMMEDIATELY: *FEVER GREATER THAN 100.4 F (38 C) OR HIGHER *CHILLS OR SWEATING *NAUSEA AND VOMITING THAT IS NOT CONTROLLED WITH YOUR NAUSEA MEDICATION *UNUSUAL SHORTNESS OF BREATH *UNUSUAL BRUISING OR BLEEDING *URINARY PROBLEMS (pain or burning when urinating, or frequent urination) *BOWEL PROBLEMS (unusual diarrhea, constipation, pain near the anus) TENDERNESS IN MOUTH AND THROAT WITH OR WITHOUT PRESENCE OF ULCERS (sore throat, sores in mouth, or a toothache) UNUSUAL RASH, SWELLING OR PAIN  UNUSUAL VAGINAL DISCHARGE OR ITCHING   Items with * indicate a potential emergency and should be followed up as soon as possible or go to the Emergency Department if any problems should occur.  Please show the CHEMOTHERAPY  ALERT CARD or IMMUNOTHERAPY ALERT CARD at check-in to the Emergency Department and triage nurse.  Should you have questions after your visit or need to cancel or reschedule your appointment, please contact [facility] - A DEPT OF [facility]  Dept: [phone]  and follow the prompts.  Office hours are 8:00 a.m. to 4:30 p.m. Monday - Friday. Please note that voicemails left after 4:00 p.m. may not be returned until the following business day.  We are closed weekends and major holidays. You have access to a nurse at all times for urgent questions. Please call the main number to the clinic Dept: [phone] and follow the prompts.   For any non-urgent questions, you may also contact your provider using MyChart. We now offer e-Visits for anyone 15 and older to request care online for non-urgent symptoms. For details visit [email or web address].   Also download the MyChart app! Go to the app store, search MyChart, open the app, select [place], and log in with your MyChart username and password.

## 2024-11-24 ENCOUNTER — Ambulatory Visit: Admitting: Student

## 2024-11-24 ENCOUNTER — Encounter: Payer: Self-pay | Admitting: Physical Therapy

## 2024-11-24 ENCOUNTER — Ambulatory Visit: Admitting: Physical Therapy

## 2024-11-24 DIAGNOSIS — M6281 Muscle weakness (generalized): Secondary | ICD-10-CM

## 2024-11-24 DIAGNOSIS — Z01810 Encounter for preprocedural cardiovascular examination: Secondary | ICD-10-CM

## 2024-11-24 DIAGNOSIS — C50412 Malignant neoplasm of upper-outer quadrant of left female breast: Secondary | ICD-10-CM

## 2024-11-24 DIAGNOSIS — R293 Abnormal posture: Secondary | ICD-10-CM

## 2024-11-24 DIAGNOSIS — Z483 Aftercare following surgery for neoplasm: Secondary | ICD-10-CM

## 2024-11-24 NOTE — Progress Notes (Signed)
 "   Virtual Visit via Telephone Note   Because of [name]'s co-morbid illnesses, she is at least at moderate risk for complications without adequate follow up.  This format is felt to be most appropriate for this patient at this time.  The patient did not have access to video technology/had technical difficulties with video requiring transitioning to audio format only (telephone).  All issues noted in this document were discussed and addressed.  No physical exam could be performed with this format.  Please refer to the patient's chart for her consent to telehealth for [name].  Evaluation Performed:  Preoperative cardiovascular risk assessment _____________   Date:  11/24/2024   Patient ID:  Audrey, Kelley 1946-07-09, MRN [id number] Patient Location:  Home Provider location:   Office  Primary Care Provider:  Rollene Almarie LABOR, MD  Primary Cardiologist:  [facility] Providers Cardiologist:  [name], MD    Chief Complaint / Patient Profile   79 y.o. y/o female with a h/o CAD s/p PCI with stent to LAD 2009, aflutter s/p ablation not on anticoagulation, hypertension, hyperlipidemia, T1DM, breast cancer who is pending bilateral mastectomy by Dr. Vernetta and presents today for telephonic preoperative cardiovascular risk assessment.  History of Present Illness    [name] is a 79 y.o. female who presents via audio/video conferencing for a telehealth visit today.  Pt was last seen in cardiology clinic on 07/08/2024 by Dr. Schilling.  At that time [name] was stable from a cardiac standpoint.  The patient is now pending procedure as outlined above. Since her last visit, she is doing well. Patient denies shortness of breath, dyspnea on exertion, lower extremity edema, orthopnea or PND. No chest pain, pressure, or tightness. No palpitations. She experiences mild lightheadedness typically the day after chemo. She is working with physical therapy 2  days a week and doing home exercises on her off days.   Past Medical History    Past Medical History:  Diagnosis Date   Arthritis    OA   Atrial flutter (HCC)    a. s/p TEE/DCCV 08/16/13 (normal LV function, no LAA thrombus).b. s/p ablation by Dr Waddell 09-23-2013   Coronary artery disease    2009 LAD Promus stent   Dysrhythmia    Aflutter, no issues after ablation 2014   Fibroid    Hyperlipidemia    Hypertension    Insulin  pump in place    Osteopenia    Type 1 diabetes mellitus on insulin  therapy (HCC)    Valvular heart disease    a. Mild MR/TR by TEE 08/2013.   Past Surgical History:  Procedure Laterality Date   ABLATION  09/23/2013   CTI by Dr Waddell   APPENDECTOMY     APPLICATION OF WOUND VAC Left 04/11/2017   Procedure: APPLICATION OF WOUND VAC LEFT KNEE;  Surgeon: Fidel Rogue, MD;  Location: [facility] OR;  Service: Orthopedics;  Laterality: Left;   ATRIAL FLUTTER ABLATION N/A 09/23/2013   Procedure: ATRIAL FLUTTER ABLATION;  Surgeon: Danelle LELON Waddell, MD;  Location: [facility];  Service: Cardiovascular;  Laterality: N/A;   CARDIOVERSION N/A 08/16/2013   Procedure: CARDIOVERSION;  Surgeon: Aleene PARAS Passe, MD;  Location: [facility] ENDOSCOPY;  Service: Cardiovascular;  Laterality: N/A;  spoke with Charlena   CATARACT EXTRACTION Bilateral    COLONOSCOPY     CORONARY ANGIOPLASTY WITH STENT PLACEMENT     EYE SURGERY     Cataract   LASER ABLATION Right  11/20/2023   Right leg laser ablation of the Greater saphenous vein with >20 stab phlebectomies   MYOMECTOMY  1977   ORIF PATELLA Left 04/11/2017   ORIF PATELLA Left 04/11/2017   Procedure: OPEN REDUCTION INTERNAL (ORIF) FIXATION PATELLA LEFT KNEE;  Surgeon: Fidel Rogue, MD;  Location: [facility] OR;  Service: Orthopedics;  Laterality: Left;   PELVIC LAPAROSCOPY     PORTACATH PLACEMENT Right 07/27/2024   Procedure: INSERTION OF TUNNELED CENTRAL VENOUS DEVICE WITH PORT;  Surgeon: Vernetta Berg, MD;  Location: [facility];   Service: General;  Laterality: Right;  PORT-A-CATH INSERTION WITH ULTRASOUND GUIDANCE   SENTINEL NODE BIOPSY Left 07/27/2024   Procedure: LEFT SENTINEL LYMPH NODE BIOPSY;  Surgeon: Vernetta Berg, MD;  Location: [facility];  Service: General;  Laterality: Left;  SENTINEL NODE BIOPSY   TEE WITHOUT CARDIOVERSION N/A 08/16/2013   Procedure: TRANSESOPHAGEAL ECHOCARDIOGRAM (TEE);  Surgeon: Aleene JINNY Passe, MD;  Location: [facility] ENDOSCOPY;  Service: Cardiovascular;  Laterality: N/A;   TUBAL LIGATION  1988    Allergies  Allergies[1]  Home Medications    Prior to Admission medications  Medication Sig Start Date End Date Taking? Authorizing Provider  Calcium  Carbonate-Vitamin D  (CALCIUM  + D PO) Take 600 mg by mouth 2 (two) times daily.    [provider]  cholecalciferol  (VITAMIN D ) 1000 units tablet Take 1,000 Units by mouth 2 (two) times daily.     [provider]  clopidogrel  (PLAVIX ) 75 MG tablet TAKE 1 TABLET DAILY 03/04/24   [name], Aleene JINNY, MD  Continuous Glucose Sensor (DEXCOM G7 15 DAY SENSOR) MISC by Does not apply route.    [provider]  cyanocobalamin  (VITAMIN B12) 1000 MCG tablet Take 1,000 mcg by mouth daily.    [provider]  dexamethasone  (DECADRON ) 4 MG tablet Take 1 tabs by mouth 2 times daily starting day before chemo. Then take 1 tabs daily for 2 days starting day after chemo. Take with food. 08/09/24   Lanny Callander, MD  ezetimibe  (ZETIA ) 10 MG tablet Take 1 tablet (10 mg total) by mouth daily. 12/26/23   Thapa, Sudan, MD  gabapentin  (NEURONTIN ) 100 MG capsule Take 2 capsules (200 mg total) by mouth at bedtime. 07/28/24   Claudene Arthea HERO, DO  Insulin  Lispro-aabc (LYUMJEV ) 100 UNIT/ML SOLN USE A MAXIMUM OF 90 UNITS DAILY VIA INSULIN  PUMP 06/10/24   Thapa, Sudan, MD  lidocaine -prilocaine  (EMLA ) cream Apply to affected area once 08/09/24   Lanny Callander, MD  losartan  (COZAAR ) 100 MG tablet Take 1 tablet (100 mg total) by mouth daily. 02/10/24  11/19/24  [name], Aleene JINNY, MD  metFORMIN  (GLUCOPHAGE -XR) 500 MG 24 hr tablet TAKE 1 TABLET IN THE MORNING AND 2 TABLETS IN THE EVENING AS DIRECTED 12/26/23   Thapa, Sudan, MD  metoprolol  succinate (TOPROL -XL) 100 MG 24 hr tablet TAKE 1 TABLET DAILY 02/19/24   [name], Aleene JINNY, MD  Multiple Vitamins-Minerals (PRESERVISION AREDS 2 PO) Take by mouth in the morning and at bedtime.    [provider]  nitroGLYCERIN  (NITROSTAT ) 0.4 MG SL tablet DISSOLVE 1 TABLET UNDER THE TONGUE EVERY 5 MINUTES AS NEEDED FOR CHEST PAIN 01/23/22   [name], Aleene JINNY, MD  ondansetron  (ZOFRAN ) 8 MG tablet Take 1 tablet (8 mg total) by mouth every 8 (eight) hours as needed for nausea or vomiting. Start on the third day after chemotherapy. 08/09/24   Lanny Callander, MD  prochlorperazine  (COMPAZINE ) 10 MG tablet Take 1 tablet (10 mg total) by mouth every  6 (six) hours as needed for nausea or vomiting. 08/09/24   Lanny Callander, MD  Pyridoxine HCl (VITAMIN B6) 100 MG TABS  08/25/23   [provider]  rosuvastatin  (CRESTOR ) 40 MG tablet Take 1 tablet (40 mg total) by mouth daily. 12/26/23   Thapa, Sudan, MD  solifenacin  (VESICARE ) 10 MG tablet TAKE 1 TABLET DAILY 09/07/24   Rollene Almarie LABOR, MD  spironolactone  (ALDACTONE ) 25 MG tablet Take 1 tablet (25 mg total) by mouth daily. 07/08/24   Lavona Agent, MD  TART CHERRY PO Take by mouth.    [provider]  traMADol  (ULTRAM ) 50 MG tablet Take 1 tablet (50 mg total) by mouth every 6 (six) hours as needed for moderate pain (pain score 4-6) or severe pain (pain score 7-10). 07/27/24   Vernetta Berg, MD    Physical Exam    Vital Signs:  [name] does not have vital signs available for review today.  Given telephonic nature of communication, physical exam is limited. AAOx3. NAD. Normal affect.  Speech and respirations are unlabored.   Assessment & Plan    Preoperative cardiovascular risk assessment. Bilateral mastectomy by Dr. Vernetta  Chart reviewed as  part of pre-operative protocol coverage. According to the RCRI, patient has a 6.6% risk of MACE. Patient reports activity equivalent to >4.0 METS (working with PT 2 days a week and doing home exercises on off days).   Given past medical history and time since last visit, based on ACC/AHA guidelines, [name] would be at acceptable risk for the planned procedure without further cardiovascular testing.   Patient was advised that if she develops new symptoms prior to surgery to contact our office to arrange a follow-up appointment.  she verbalized understanding.  Per office protocol, has a history of CAD s/p remote stenting to LAD in 2009. Currently on Plavix  monotherapy. Ok to hold Plavix  5 days prior but should ideally be on Aspirin  81 mg daily while off this if okay with surgeon.   I will route this recommendation to the requesting party via Epic fax function.  Please call with questions.  Time:   Today, I have spent 7 minutes with the patient with telehealth technology discussing medical history, symptoms, and management plan.     Barnie Hila, NP  11/24/2024, 10:54 AM     [1]  Allergies Allergen Reactions   Sulfa Antibiotics Rash   Sulfamethoxazole-Trimethoprim Rash   "

## 2024-11-24 NOTE — Therapy (Signed)
 " OUTPATIENT PHYSICAL THERAPY BREAST CANCER POST OP FOLLOW UP   Patient Name: [name] MRN: [id number] DOB:05-07-1946, 79 y.o., female Today's Date: 11/24/2024  END OF SESSION:  PT End of Session - 11/24/24 1105     Visit Number 12    Number of Visits 16    Date for Recertification  12/08/24    PT Start Time 1104    PT Stop Time 1148    PT Time Calculation (min) 44 min    Activity Tolerance Patient tolerated treatment well    Behavior During Therapy [facility] for tasks assessed/performed              Past Medical History:  Diagnosis Date   Arthritis    OA   Atrial flutter (HCC)    a. s/p TEE/DCCV 08/16/13 (normal LV function, no LAA thrombus).b. s/p ablation by Dr Waddell 09-23-2013   Coronary artery disease    2009 LAD Promus stent   Dysrhythmia    Aflutter, no issues after ablation 2014   Fibroid    Hyperlipidemia    Hypertension    Insulin  pump in place    Osteopenia    Type 1 diabetes mellitus on insulin  therapy (HCC)    Valvular heart disease    a. Mild MR/TR by TEE 08/2013.   Past Surgical History:  Procedure Laterality Date   ABLATION  09/23/2013   CTI by Dr Waddell   APPENDECTOMY     APPLICATION OF WOUND VAC Left 04/11/2017   Procedure: APPLICATION OF WOUND VAC LEFT KNEE;  Surgeon: Fidel Rogue, MD;  Location: [facility] OR;  Service: Orthopedics;  Laterality: Left;   ATRIAL FLUTTER ABLATION N/A 09/23/2013   Procedure: ATRIAL FLUTTER ABLATION;  Surgeon: Danelle LELON Waddell, MD;  Location: [facility] CATH LAB;  Service: Cardiovascular;  Laterality: N/A;   CARDIOVERSION N/A 08/16/2013   Procedure: CARDIOVERSION;  Surgeon: Aleene JINNY Passe, MD;  Location: [facility] ENDOSCOPY;  Service: Cardiovascular;  Laterality: N/A;  spoke with Tom   CATARACT EXTRACTION Bilateral    COLONOSCOPY     CORONARY ANGIOPLASTY WITH STENT PLACEMENT     EYE SURGERY     Cataract   LASER ABLATION Right 11/20/2023   Right leg laser ablation of the Greater saphenous vein with >20 stab phlebectomies    MYOMECTOMY  1977   ORIF PATELLA Left 04/11/2017   ORIF PATELLA Left 04/11/2017   Procedure: OPEN REDUCTION INTERNAL (ORIF) FIXATION PATELLA LEFT KNEE;  Surgeon: Fidel Rogue, MD;  Location: [facility] OR;  Service: Orthopedics;  Laterality: Left;   PELVIC LAPAROSCOPY     PORTACATH PLACEMENT Right 07/27/2024   Procedure: INSERTION OF TUNNELED CENTRAL VENOUS DEVICE WITH PORT;  Surgeon: Vernetta Berg, MD;  Location: [facility];  Service: General;  Laterality: Right;  PORT-A-CATH INSERTION WITH ULTRASOUND GUIDANCE   SENTINEL NODE BIOPSY Left 07/27/2024   Procedure: LEFT SENTINEL LYMPH NODE BIOPSY;  Surgeon: Vernetta Berg, MD;  Location: [facility];  Service: General;  Laterality: Left;  SENTINEL NODE BIOPSY   TEE WITHOUT CARDIOVERSION N/A 08/16/2013   Procedure: TRANSESOPHAGEAL ECHOCARDIOGRAM (TEE);  Surgeon: Aleene JINNY Passe, MD;  Location: [facility] ENDOSCOPY;  Service: Cardiovascular;  Laterality: N/A;   TUBAL LIGATION  1988   Patient Active Problem List   Diagnosis Date Noted   Atrial flutter (HCC) 07/05/2024   Malignant neoplasm of upper-outer quadrant of left breast in female, estrogen receptor positive (HCC) 06/28/2024   Acute lateral meniscal tear, right, initial encounter 09/02/2023   Varicose veins of  both lower extremities with pain 07/04/2023   Arthritis of carpometacarpal [facility]) joint of both thumbs 01/17/2022   Bilateral hearing loss 01/08/2022   Hypertension 05/13/2019   Urinary incontinence 01/19/2019   Valvular heart disease    Insulin  pump in place    Fibroid    Osteoarthritis    Mixed diabetic hyperlipidemia associated with type 1 diabetes mellitus (HCC) 03/16/2015   Type 1 diabetes mellitus with complication (HCC) 05/12/2013   Chronic insomnia 03/25/2012   Osteopenia    Atherosclerosis of native coronary artery of native heart with angina pectoris 04/05/2011    REFERRING PROVIDER: Dr. Vicenta Poli  REFERRING DIAG: Left breast  cancer  THERAPY DIAG:  Muscle weakness (generalized)  Aftercare following surgery for neoplasm  Abnormal posture  Malignant neoplasm of upper-outer quadrant of left breast in female, estrogen receptor positive (HCC)  Rationale for Evaluation and Treatment: Rehabilitation  ONSET DATE: 07/27/2024  SUBJECTIVE:                                                                                                                                                                                           SUBJECTIVE STATEMENT: I had chemo yesterday and I feel more wobbly today. I have been up for a while but I just can't shake it.   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 She underwent a left lumpectomy and sentinel node biopsy (4 negative nodes) on 07/27/2024. It is triple positive with a Ki67 of 3%. She is currently undergoing chemotherapy. Pt is unsure if she will need radiation.    PATIENT GOALS:  Reassess how my recovery is going related to arm function, pain, and swelling.  PAIN:  Are you having pain? Yes: NPRS scale: 1/10 Pain location: lateral right UE Pain description: nagging pain Aggravating factors: putting things overhead. Relieving factors: advil  PRECAUTIONS: Recent Surgery, left UE Lymphedema risk  RED FLAGS: None   ACTIVITY LEVEL / LEISURE: walked the dogs a couple of block on Wednesday, has not been going to the fitness center lately due to fatigue   OBJECTIVE:   PATIENT SURVEYS:  QUICK DASH:     OBSERVATIONS: Healing lumpectomy scar but increased scar tissue palpable  POSTURE:  Increased kyphosis, forward head  LYMPHEDEMA ASSESSMENT:   UPPER EXTREMITY AROM/PROM:   A/PROM RIGHT   eval    Shoulder extension 50  Shoulder flexion 147  Shoulder abduction 151  Shoulder internal rotation 72  Shoulder external rotation 43                          (  Blank rows = not tested)   A/PROM  LEFT   eval LEFT 10/11/2024 LEFT 11/10/24  Shoulder extension 51 74 60  Shoulder flexion 135 162 160  Shoulder abduction 157 160 165  Shoulder internal rotation 57 65 65  Shoulder external rotation 79 78 72                          (Blank rows = not tested)   CERVICAL AROM: All within normal limits:      Percent limited  Flexion WNL  Extension WFL  Right lateral flexion 50% limited  Left lateral flexion 75% limited  Right rotation 50% limited  Left rotation           50% limited      UPPER EXTREMITY STRENGTH: WFL   LYMPHEDEMA ASSESSMENTS (in cm):    LANDMARK RIGHT   eval RIGHT 10/11/2024  10 cm proximal to olecranon process 27.7 26.1  Olecranon process 26.5 25.5  10 cm proximal to ulnar styloid process 22 20  Just proximal to ulnar styloid process 17.4 16.7  Across hand at thumb web space 18.8 19.5  At base of 2nd digit 6.9 6.7  (Blank rows = not tested)   LANDMARK LEFT   eval LEFT 10/11/2024  10 cm proximal to olecranon process 27.2 25.7  Olecranon process 26.1 25  10  cm proximal to ulnar styloid process 21.9 20.7  Just proximal to ulnar styloid process 17.7 16.9  Across hand at thumb web space 19.4 19.6  At base of 2nd digit 6.8 6.6  (Blank rows = not tested)  LOWER EXTREMITY MMT:  MMT Right eval RIGHT 11/10/24  Hip flexion 3+ 5/5  Hip extension 3   Hip abduction 4+   Hip adduction    Hip internal rotation    Hip external rotation    Knee flexion 5   Knee extension 5   Ankle dorsiflexion 5   Ankle plantarflexion    Ankle inversion    Ankle eversion     (Blank rows = not tested)  MMT LEFT eval LEFT 11/10/24  Hip flexion 3+ 5/5  Hip extension 3   Hip abduction 4+   Hip adduction    Hip internal rotation    Hip external rotation    Knee flexion 4   Knee extension 5   Ankle dorsiflexion 4+   Ankle plantarflexion    Ankle inversion    Ankle eversion     (Blank rows = not tested)  Surgery type/Date: 07/27/2024 left lumpectomy and sentinel node  biopsy Number of lymph nodes removed: 4 Current/past treatment (chemo, radiation, hormone therapy): chemotherapy Other symptoms:  Heaviness/tightness No Pain No Pitting edema No Infections No Decreased scar mobility Yes Stemmer sign No  30 SEC SIT TO STAND: 10 reps which is below average for her age  SINGLE LIMB STANCE:  11/10/24: R - 2 sec, L 6 sec  At eval: R - 5 seconds, L 8 seconds  GAIT: Shortened step length bilaterally, foot flat, unsteady at time but no losses of balance  TANDEM STANCE:  11/11/23: 9 sec At eval:Tandem with R foot in from 3 sec, with L foot in front 8 seconds  TREATMENT PERFORMED: 11/24/2024 Nu step seat 10, UE 9 L4 x 10 min, 842 steps Seated ankle DF with red band x 20 reps bilaterally - issued this to pt to do at home as well Sit to stand from high table with red plyoball overhead press  and slow lower, 2 x 5 Incline stretch x 3, 20 sec Ax beam: 6  lengths forward walking with intermittent hand touch prn for balance, and 6 Lengths sidestepping with intermittent hand hold Tandem stance to failure x 2  Stepping over hurdles forward 6 lengths, sideways 6 lengths; intermittent hand touch Dual cable retraction 3 # 2 x 10 with emphasis on proper posture  11/22/2024 Nu step seat 10, UE 9 L4 x 10 min, 815 steps Sit to stand from high table with red plyoball overhead press and slow lower, 2 x 5 Incline stretch x 3, 20 sec Ax beam: 6  lengths forward walking with intermittent hand touch prn for balance 6 Lengths sidestepping with intermittent hand hold Tandem stance x 2 B to failure Stepping over hurdles forward 6 lengths, sideways 6 lengths; intermittent hand touch Dual cable retraction 3 # 2 x 10 with emphasis on proper posture ADD DF with band next 11/17/2024 Sit to stand from chair x 2 but difficult without UeEs Sit to stand from high table with red plyoball overhead press and slow lower, 3 x 5 Nu step seat 10, UE 9 L4 x 10 min, 965 steps Incline stretch x  3, 20 sec Dual cable retraction 3 # 1 x 10 with feet in staggered stance, x 10 with near tandem stance, x 5 near tandem Ambulated to ortho gym and went up and down 4 steps with bilateral rails with slow lowering x 3 Leg Press 85# B 2 x 10 reps 4 hurdles in //bars; forward walking 6 lengths in foot in between, sidestepping both feet between 4 lengths with light HH HS stretch seated x 3 ea, 20 sec 11/15/2024 NuStep level 4, seat at 10, arms at 9 x 10 min, 905  Dual cable machine: 3 lbs for scap retraction x 10 reps with pt returning therapist demo, then x 5 ea in half tandem stance Incline stretch x 3, 20 secs Standing march in // bars x 10 ea Standing heel raises x 10 ea Ax beam x 6 lengths forward, x 4 lengths sidestepping with intermittent hand hold, and encouragement to focus on object, seated rest SLS on blue foam 3 D SLR;flex, abd ext x 10 ea with intermittent HH Tandem stance B x 3 ea to failure, more difficult with Left leg forward. Bilateral HS stretch x 2 ea., 20 seconds  11/10/24 Assessed progress towards all goals in therapy - assessed strength and balance NuStep level 4, seat at 10, arms at 9 x 10 min - 773 steps  Tandem walking on air ex foam x 4 with bilateral HHA with increased fatigue requiring seated recovery period afterwards  Attempted 3 way hip on purple foam but pt became very fatigued and short of breath after 10 reps of L hip flexion and required another seated recovery periods due to fatigue and shortness of breath Self care- discussed how chemo effects the body and how it is important to listen to the body such as dizziness and shortness of breath and take recovery periods as needed and not to push during the time right after chemo. Also educated pt how balance is effected by chemo.   11/03/2024 NuStep level 4, seat at 10, arms at 9 x 10 min - 952 steps  Dual cable machine: 3 lbs for scap retraction x 10 reps with pt returning therapist demo Leg press seat at 7, 70 lbs  1 x 580 lbs- 1 sets of 10 reps, 85 lbs 1 x 10 reps  Bilateral HS stretch seated 3 x 20 sec 3 way hip machine: 25 lbs in flexion, abduction and extension x 10 each bilaterally with pt returning therapist demo Ball squeeze 5 sec hold x 10 Tandem stance in// bars; 2 B, 30 sec with intermittent HH  And VC's for posture and core stab  11/01/24:  Therapeutic Exercise: Seated hamstring stretch x 30 sec holds x 2 reps bilaterally with hands on hips to keep from rounded back NuStep level 4, seat at 10, arms at 9 x 10 min - steps 835 3 way hip machine: 25 lbs in flexion, abduction and extension x 10 each bilaterally with pt returning therapist demo Leg press seat at 7, 70 lbs- 2 sets of 10 reps Dual cable machine: 3 lbs for scap retraction x 10 reps with pt returning therapist demo Therapeutic Activity: Bridging x 10 reps with 5 sec holds with v/c to engage core throughout and not just push up with glutes Pelvic tilts x 10 reps with 5 sec holds with pt demonstrating good form Seated on red disc for core engagement: TKE with 2lb ankle weights with 3 sec holds x 10 reps bilaterally  Seated hip flexion with 2 lb ankle weights x 10 with v/c to not lean back Ball squeezes with 5 sec holds x 10   10/21/24:  Therapeutic Exercise: Seated hamstring stretch x 30 sec holds x 2 reps bilaterally with hands on hips to keep from rounded back NuStep level 4, seat at 10, arms at 9 (no arms half way through due to hx of SLNB) x 10 min - steps 940 Therapeutic Activity: Bridging x 12 reps with 5 sec holds with pt demonstrating good form today and no hamstring cramping Pelvic tilts x 10 reps with 5 sec holds with pt demonstrating good form Scapular retraction with v/c to pull back and down x 10 reps with yellow band and cues for upright posture and keeping elbows close Seated on red disc for core engagement: TKE with 2lb ankle weights with 3 sec holds x 10 reps bilaterally  Seated hip flexion with 2 lb ankle weights  x 10 with v/c to not lean back Ball squeezes with 5 sec holds x 13  Hip abduction x 10 reps with green theraband Neuro reed: Standing on air ex with occasional HHA for balance: hip flexion x 10 reps bilaterally then hip extension x 10 bilaterally with more difficulty with balance, hip abduction x 10 bilaterally  Tandem walking x 4 on air ex beam with occasional hand held assist  10/19/24:  Therapeutic Exercise: Seated hamstring stretch x 30 sec holds x 2 reps bilaterally with v/c to keep from rounding back TKE with 2lb ankle weights with 3 sec holds x 10 reps bilaterally  Seated hip flexion with 2 lb ankle weights x 10 with v/c to not lean back NuStep level 4, seat at 10, arms at 9 (no arms half way through due to hx of SLNB) x 10 min - steps 870 Therapeutic Activity: Bridging x 10 reps with 5 sec holds with pt demonstrating good form today and no hamstring cramping Pelvic tilts x 10 reps with 5 sec holds with pt demonstrating good form Scapular retraction with v/c to pull back and down x 10 reps with yellow band and cues for upright posture and keeping elbows close Neuro reed: Standing on air ex with 1 HHA: hip flexion x 10 reps bilaterally then hip extension with v/c to keep knee straight with pt having difficulty clearing  mat due to inability to hip hike  10/12/24:  Therapeutic Exercise: Seated hamstring stretch x 30 sec holds x 2 reps bilaterally with v/c to keep from rounding back TKE with 2lb ankle weights with 3 sec holds x 10 reps bilaterally  Seated hip flexion with 2 lb ankle weights x 10 with v/c to not lean back NuStep level 3, seat at 10, arms at 9 (using arms 2 min on/2 min off due to hx of SLNB) x 10 min Therapeutic Activity: Bridging x 10 reps with v/c and t/c to use the core and engage core throughout to avoid hamstring cramping Pelvic tilts x 10 reps with 5 sec holds with v/c and t/c for proper form Scapular retraction with v/c to pull back and down x 10 reps with red  band   PATIENT EDUCATION:  Education details: how chemo effects the body including fatigue and balance and importance of not pushing through in the time right after chemo Person educated: Patient Education method: Explanation Education comprehension: verbalized understanding  HOME EXERCISE PROGRAM: Reviewed previously given post op HEP. Practice sit to stands from chair without use of UEs  Access Code: [email or web address] URL: [email or web address] Date: 10/12/2024 Prepared by: Florina Lanis Carbon  Exercises - Seated Hamstring Stretch  - 1 x daily - 7 x weekly - 1 sets - 2 reps - 30 sec hold - Seated Hip Flexion March with Ankle Weights  - 1 x daily - 7 x weekly - 1 sets - 10 reps - Seated Long Arc Quad with Ankle Weight  - 1 x daily - 7 x weekly - 1 sets - 10 reps - 3 sec hold - Supine Bridge  - 1 x daily - 7 x weekly - 1 sets - 10 reps - Supine Posterior Pelvic Tilt  - 1 x daily - 7 x weekly - 1-3 sets - 10 reps - 5 sec hold - Scapular Retraction with Resistance  - 1 x daily - 7 x weekly - 1 sets - 10 reps   ASSESSMENT:  CLINICAL IMPRESSION: Pt initially feeling very fatigued from chemo yesterday. Assessed pt throughout session but was able to get through entire session today. Continued to focus on neuro re ed to help improve balance and decrease fall risk as well as posture exercises. Pt has been compliant with her HEP except for the dya she had chemo. Added ankle DF strengthening.   Pt will benefit from skilled therapeutic intervention to improve on the following deficits: Decreased knowledge of precautions, impaired UE functional use, pain, decreased ROM, postural dysfunction.   PT treatment/interventions: ADL/Self care home management, [phone]- PT Re-evaluation, 97110-Therapeutic exercises, 97530- Therapeutic activity, [id number]- Neuromuscular re-education, 97535- Self Care, [id number]- Manual therapy, [id number]- Gait training, and Patient/Family education   GOALS: Goals reviewed  with patient? Yes  GOALS MET AT EVAL:  GOALS Name Target Date Goal status  1 Pt will be able to verbalize understanding of pertinent lymphedema risk reduction practices relevant to her dx specifically related to skin care.  Baseline:  No knowledge Eval Achieved at eval  2 Pt will be able to return demo and/or verbalize understanding of the post op HEP related to regaining shoulder ROM. Baseline:  No knowledge Eval Achieved at eval  3 Pt will be able to verbalize understanding of the importance of viewing the post op After Breast CA Class video for further lymphedema risk reduction education and therapeutic exercise.  Baseline:  No knowledge Eval Achieved at eval   LONG TERM GOALS:  (  STG=LTG)  GOALS Name Target Date  Goal status  1 Pt will demonstrate she has regained full shoulder ROM and function post operatively compared to baselines.  Baseline: 08/25/2024 MET 11/10/24  2 Pt will demonstrate at least 4/5 bilateral hip flexion strength to decrease fall risk. 11/08/24 MET 11/10/24  3 Pt will be able to complete SLS bilaterally for at least 15 sec to decrease fall risk. 11/08/24 ONGOING 11/10/24  4 Pt will be able to stand in tandem stance for 15 sec with no losses of balance to decrease fall risk. 11/08/24 ONGOING 11/10/24  5 Pt will be independent in a home exercise program for continued stretching and strengthening.  11/08/24 ONGOING 11/10/24     PLAN:  PT FREQUENCY/DURATION: 2x/wk for 4 weeks  PLAN FOR NEXT SESSION: DF strength with TB especially for left,how are effects from chemo?  NuStep, hip strengthening, balance exercises, air ex beam, hamstring stretching, posture exercises, give post op handout   [facility]  [address], [address]  [place] [address]  [phone]  After Breast Cancer Class Video It is recommended you view the ABC class video to be educated on lymphedema risk reduction. This video lasts for about 30 minutes. It can be viewed on our website  here: [email or web address]  Scar massage You can begin gentle scar massage to you incision sites. Gently place one hand on the incision and move the skin (without sliding on the skin) in various directions. Do this for a few minutes and then you can gently massage either coconut oil or vitamin E cream into the scars.  Compression garment You should continue wearing your compression bra until you feel like you no longer have swelling.  Home exercise Program Continue doing the exercises you were given until you feel like you can do them without feeling any tightness at the end.   Walking Program Studies show that 30 minutes of walking per day (fast enough to elevate your heart rate) can significantly reduce the risk of a cancer recurrence. If you can't walk due to other medical reasons, we encourage you to find another activity you could do (like a stationary bike or water exercise).  Posture After breast cancer surgery, people frequently sit with rounded shoulders posture because it puts their incisions on slack and feels better. If you sit like this and scar tissue forms in that position, you can become very tight and have pain sitting or standing with good posture. Try to be aware of your posture and sit and stand up tall to heal properly.  Follow up PT: It is recommended you return every 3 months for the first 3 years following surgery to be assessed on the SOZO machine for an L-Dex score. This helps prevent clinically significant lymphedema in 95% of patients. These follow up screens are 10 minute appointments that you are not billed for.  [facility], PT 11/24/2024, 11:55 AM  "

## 2024-11-25 ENCOUNTER — Inpatient Hospital Stay

## 2024-11-25 VITALS — BP 146/70 | HR 78 | Temp 97.6°F | Resp 16

## 2024-11-25 DIAGNOSIS — Z5111 Encounter for antineoplastic chemotherapy: Secondary | ICD-10-CM | POA: Diagnosis not present

## 2024-11-25 DIAGNOSIS — C50412 Malignant neoplasm of upper-outer quadrant of left female breast: Secondary | ICD-10-CM

## 2024-11-25 MED ORDER — PEGFILGRASTIM-FPGK 6 MG/0.6ML ~~LOC~~ SOSY
6.0000 mg | PREFILLED_SYRINGE | Freq: Once | SUBCUTANEOUS | Status: AC
  Administered 2024-11-25: 6 mg via SUBCUTANEOUS
  Filled 2024-11-25: qty 0.6

## 2024-11-26 ENCOUNTER — Inpatient Hospital Stay

## 2024-12-01 ENCOUNTER — Ambulatory Visit: Admitting: Physical Therapy

## 2024-12-02 ENCOUNTER — Encounter: Payer: Self-pay | Admitting: Hematology

## 2024-12-03 ENCOUNTER — Ambulatory Visit

## 2024-12-03 ENCOUNTER — Telehealth: Payer: Self-pay

## 2024-12-03 DIAGNOSIS — M6281 Muscle weakness (generalized): Secondary | ICD-10-CM | POA: Diagnosis not present

## 2024-12-03 DIAGNOSIS — Z483 Aftercare following surgery for neoplasm: Secondary | ICD-10-CM

## 2024-12-03 DIAGNOSIS — R293 Abnormal posture: Secondary | ICD-10-CM

## 2024-12-03 DIAGNOSIS — C50412 Malignant neoplasm of upper-outer quadrant of left female breast: Secondary | ICD-10-CM

## 2024-12-03 NOTE — Telephone Encounter (Signed)
 Returned patients call. She was inquiring why could she not just have the mastectomy and the implant placed at the same time. Explained the expander is placed to help the skin stretch after the excision of the mastectomy surgery in order to exchange out with implants. Meantime, she will come to the office and will have the expanders filled to the size of breast she would like to have. Also, explain the reason why we use drains.  Reminded patient she has a Pre-Op appointment on 12/16/2024 at 3:20 pm with [name] And she can answer additional questions she may have.  Patient understood and agreed with the plan.

## 2024-12-03 NOTE — Therapy (Signed)
 " OUTPATIENT PHYSICAL THERAPY BREAST CANCER POST OP FOLLOW UP   Patient Name: [name] MRN: [id number] DOB:Sep 15, 1946, 79 y.o., female Today's Date: 12/03/2024  END OF SESSION:  PT End of Session - 12/03/24 1002     Visit Number 13    Number of Visits 16    Date for Recertification  12/08/24    PT Start Time 1003    PT Stop Time 1040    PT Time Calculation (min) 37 min    Activity Tolerance Patient tolerated treatment well    Behavior During Therapy [facility] for tasks assessed/performed              Past Medical History:  Diagnosis Date   Arthritis    OA   Atrial flutter (HCC)    a. s/p TEE/DCCV 08/16/13 (normal LV function, no LAA thrombus).b. s/p ablation by Dr Waddell 09-23-2013   Coronary artery disease    2009 LAD Promus stent   Dysrhythmia    Aflutter, no issues after ablation 2014   Fibroid    Hyperlipidemia    Hypertension    Insulin  pump in place    Osteopenia    Type 1 diabetes mellitus on insulin  therapy (HCC)    Valvular heart disease    a. Mild MR/TR by TEE 08/2013.   Past Surgical History:  Procedure Laterality Date   ABLATION  09/23/2013   CTI by Dr Waddell   APPENDECTOMY     APPLICATION OF WOUND VAC Left 04/11/2017   Procedure: APPLICATION OF WOUND VAC LEFT KNEE;  Surgeon: Fidel Rogue, MD;  Location: [facility] OR;  Service: Orthopedics;  Laterality: Left;   ATRIAL FLUTTER ABLATION N/A 09/23/2013   Procedure: ATRIAL FLUTTER ABLATION;  Surgeon: Danelle LELON Waddell, MD;  Location: [facility] CATH LAB;  Service: Cardiovascular;  Laterality: N/A;   CARDIOVERSION N/A 08/16/2013   Procedure: CARDIOVERSION;  Surgeon: Aleene JINNY Passe, MD;  Location: [facility] ENDOSCOPY;  Service: Cardiovascular;  Laterality: N/A;  spoke with Tom   CATARACT EXTRACTION Bilateral    COLONOSCOPY     CORONARY ANGIOPLASTY WITH STENT PLACEMENT     EYE SURGERY     Cataract   LASER ABLATION Right 11/20/2023   Right leg laser ablation of the Greater saphenous vein with >20 stab phlebectomies    MYOMECTOMY  1977   ORIF PATELLA Left 04/11/2017   ORIF PATELLA Left 04/11/2017   Procedure: OPEN REDUCTION INTERNAL (ORIF) FIXATION PATELLA LEFT KNEE;  Surgeon: Fidel Rogue, MD;  Location: [facility] OR;  Service: Orthopedics;  Laterality: Left;   PELVIC LAPAROSCOPY     PORTACATH PLACEMENT Right 07/27/2024   Procedure: INSERTION OF TUNNELED CENTRAL VENOUS DEVICE WITH PORT;  Surgeon: Vernetta Berg, MD;  Location: [facility];  Service: General;  Laterality: Right;  PORT-A-CATH INSERTION WITH ULTRASOUND GUIDANCE   SENTINEL NODE BIOPSY Left 07/27/2024   Procedure: LEFT SENTINEL LYMPH NODE BIOPSY;  Surgeon: Vernetta Berg, MD;  Location: [facility];  Service: General;  Laterality: Left;  SENTINEL NODE BIOPSY   TEE WITHOUT CARDIOVERSION N/A 08/16/2013   Procedure: TRANSESOPHAGEAL ECHOCARDIOGRAM (TEE);  Surgeon: Aleene JINNY Passe, MD;  Location: [facility] ENDOSCOPY;  Service: Cardiovascular;  Laterality: N/A;   TUBAL LIGATION  1988   Patient Active Problem List   Diagnosis Date Noted   Atrial flutter (HCC) 07/05/2024   Malignant neoplasm of upper-outer quadrant of left breast in female, estrogen receptor positive (HCC) 06/28/2024   Acute lateral meniscal tear, right, initial encounter 09/02/2023   Varicose veins of  both lower extremities with pain 07/04/2023   Arthritis of carpometacarpal [facility]) joint of both thumbs 01/17/2022   Bilateral hearing loss 01/08/2022   Hypertension 05/13/2019   Urinary incontinence 01/19/2019   Valvular heart disease    Insulin  pump in place    Fibroid    Osteoarthritis    Mixed diabetic hyperlipidemia associated with type 1 diabetes mellitus (HCC) 03/16/2015   Type 1 diabetes mellitus with complication (HCC) 05/12/2013   Chronic insomnia 03/25/2012   Osteopenia    Atherosclerosis of native coronary artery of native heart with angina pectoris 04/05/2011    REFERRING PROVIDER: Dr. Vicenta Poli  REFERRING DIAG: Left breast  cancer  THERAPY DIAG:  Muscle weakness (generalized)  Aftercare following surgery for neoplasm  Abnormal posture  Malignant neoplasm of upper-outer quadrant of left breast in female, estrogen receptor positive (HCC)  Rationale for Evaluation and Treatment: Rehabilitation  ONSET DATE: 07/27/2024  SUBJECTIVE:                                                                                                                                                                                           SUBJECTIVE STATEMENT:  This is the first day I have been out. My driveway is a sheet of ice. I still feel a little weak, and I am forcing myself to eat. We went to the beach on Saturday and they didn't get any snow and schools were not closed.  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 She underwent a left lumpectomy and sentinel node biopsy (4 negative nodes) on 07/27/2024. It is triple positive with a Ki67 of 3%. She is currently undergoing chemotherapy. Pt is unsure if she will need radiation.    PATIENT GOALS:  Reassess how my recovery is going related to arm function, pain, and swelling.  PAIN:  Are you having pain? Yes: NPRS scale: 1/10 Pain location: lateral right UE Pain description: nagging pain Aggravating factors: putting things overhead. Relieving factors: advil  PRECAUTIONS: Recent Surgery, left UE Lymphedema risk  RED FLAGS: None   ACTIVITY LEVEL / LEISURE: walked the dogs a couple of block on Wednesday, has not been going to the fitness center lately due to fatigue   OBJECTIVE:   PATIENT SURVEYS:  QUICK DASH:     OBSERVATIONS: Healing lumpectomy scar but increased scar tissue palpable  POSTURE:  Increased kyphosis, forward head  LYMPHEDEMA ASSESSMENT:   UPPER EXTREMITY AROM/PROM:   A/PROM RIGHT   eval    Shoulder extension 50  Shoulder flexion 147  Shoulder abduction 151  Shoulder  internal rotation 72  Shoulder external rotation 43                          (Blank rows = not tested)   A/PROM LEFT   eval LEFT 10/11/2024 LEFT 11/10/24  Shoulder extension 51 74 60  Shoulder flexion 135 162 160  Shoulder abduction 157 160 165  Shoulder internal rotation 57 65 65  Shoulder external rotation 79 78 72                          (Blank rows = not tested)   CERVICAL AROM: All within normal limits:      Percent limited  Flexion WNL  Extension WFL  Right lateral flexion 50% limited  Left lateral flexion 75% limited  Right rotation 50% limited  Left rotation           50% limited      UPPER EXTREMITY STRENGTH: WFL   LYMPHEDEMA ASSESSMENTS (in cm):    LANDMARK RIGHT   eval RIGHT 10/11/2024  10 cm proximal to olecranon process 27.7 26.1  Olecranon process 26.5 25.5  10 cm proximal to ulnar styloid process 22 20  Just proximal to ulnar styloid process 17.4 16.7  Across hand at thumb web space 18.8 19.5  At base of 2nd digit 6.9 6.7  (Blank rows = not tested)   LANDMARK LEFT   eval LEFT 10/11/2024  10 cm proximal to olecranon process 27.2 25.7  Olecranon process 26.1 25  10  cm proximal to ulnar styloid process 21.9 20.7  Just proximal to ulnar styloid process 17.7 16.9  Across hand at thumb web space 19.4 19.6  At base of 2nd digit 6.8 6.6  (Blank rows = not tested)  LOWER EXTREMITY MMT:  MMT Right eval RIGHT 11/10/24  Hip flexion 3+ 5/5  Hip extension 3   Hip abduction 4+   Hip adduction    Hip internal rotation    Hip external rotation    Knee flexion 5   Knee extension 5   Ankle dorsiflexion 5   Ankle plantarflexion    Ankle inversion    Ankle eversion     (Blank rows = not tested)  MMT LEFT eval LEFT 11/10/24  Hip flexion 3+ 5/5  Hip extension 3   Hip abduction 4+   Hip adduction    Hip internal rotation    Hip external rotation    Knee flexion 4   Knee extension 5   Ankle dorsiflexion 4+   Ankle plantarflexion    Ankle inversion     Ankle eversion     (Blank rows = not tested)  Surgery type/Date: 07/27/2024 left lumpectomy and sentinel node biopsy Number of lymph nodes removed: 4 Current/past treatment (chemo, radiation, hormone therapy): chemotherapy Other symptoms:  Heaviness/tightness No Pain No Pitting edema No Infections No Decreased scar mobility Yes Stemmer sign No  30 SEC SIT TO STAND: 10 reps which is below average for her age  SINGLE LIMB STANCE:  11/10/24: R - 2 sec, L 6 sec  At eval: R - 5 seconds, L 8 seconds  GAIT: Shortened step length bilaterally, foot flat, unsteady at time but no losses of balance  TANDEM STANCE:  11/11/23: 9 sec At eval:Tandem with R foot in from 3 sec, with L foot in front 8 seconds  TREATMENT PERFORMED: 12/03/2024  Nu step seat 10, UE 9 L4 x , Lev3  x 5 min  842 steps Incline stretch x 3, 20 sec Pt then reported not feeling well so had pt sit in a chair to rest and gave her water. Her insulin  pump went off due to high sugar. She adjusted her insulin , but continued not to feel well. She reported feeling fine while sitting, and only unwell with standing. Discussed with pt ending therapy which she thought was a good idea. Offered to call her husband since she drove here, but she said as long as she is sitting she feels fine. Assisted pt to the car, and again discussed driving and she reported feeling fine now that she is sitting. Advised to go straight home. Called pt at the end of the morning and she is home and still not feeling the greatest , but is OK with sitting and lying. Her infusion was 8 days ago and she believes it is from that. Will return to therapy next week if feeling better. 11/24/2024 Nu step seat 10, UE 9 L4 x 10 min, 842 steps Seated ankle DF with red band x 20 reps bilaterally - issued this to pt to do at home as well Sit to stand from high table with red plyoball overhead press and slow lower, 2 x 5 Incline stretch x 3, 20 sec Ax beam: 6  lengths  forward walking with intermittent hand touch prn for balance, and 6 Lengths sidestepping with intermittent hand hold Tandem stance to failure x 2  Stepping over hurdles forward 6 lengths, sideways 6 lengths; intermittent hand touch Dual cable retraction 3 # 2 x 10 with emphasis on proper posture  11/22/2024 Nu step seat 10, UE 9 L4 x 10 min, 815 steps Sit to stand from high table with red plyoball overhead press and slow lower, 2 x 5 Incline stretch x 3, 20 sec Ax beam: 6  lengths forward walking with intermittent hand touch prn for balance 6 Lengths sidestepping with intermittent hand hold Tandem stance x 2 B to failure Stepping over hurdles forward 6 lengths, sideways 6 lengths; intermittent hand touch Dual cable retraction 3 # 2 x 10 with emphasis on proper posture ADD DF with band next 11/17/2024 Sit to stand from chair x 2 but difficult without UeEs Sit to stand from high table with red plyoball overhead press and slow lower, 3 x 5 Nu step seat 10, UE 9 L4 x 10 min, 965 steps Incline stretch x 3, 20 sec Dual cable retraction 3 # 1 x 10 with feet in staggered stance, x 10 with near tandem stance, x 5 near tandem Ambulated to ortho gym and went up and down 4 steps with bilateral rails with slow lowering x 3 Leg Press 85# B 2 x 10 reps 4 hurdles in //bars; forward walking 6 lengths in foot in between, sidestepping both feet between 4 lengths with light HH HS stretch seated x 3 ea, 20 sec 11/15/2024 NuStep level 4, seat at 10, arms at 9 x 10 min, 905  Dual cable machine: 3 lbs for scap retraction x 10 reps with pt returning therapist demo, then x 5 ea in half tandem stance Incline stretch x 3, 20 secs Standing march in // bars x 10 ea Standing heel raises x 10 ea Ax beam x 6 lengths forward, x 4 lengths sidestepping with intermittent hand hold, and encouragement to focus on object, seated rest SLS on blue foam 3 D SLR;flex, abd ext x 10 ea with intermittent HH Tandem stance B x 3 ea to  failure,  more difficult with Left leg forward. Bilateral HS stretch x 2 ea., 20 seconds  11/10/24 Assessed progress towards all goals in therapy - assessed strength and balance NuStep level 4, seat at 10, arms at 9 x 10 min - 773 steps  Tandem walking on air ex foam x 4 with bilateral HHA with increased fatigue requiring seated recovery period afterwards  Attempted 3 way hip on purple foam but pt became very fatigued and short of breath after 10 reps of L hip flexion and required another seated recovery periods due to fatigue and shortness of breath Self care- discussed how chemo effects the body and how it is important to listen to the body such as dizziness and shortness of breath and take recovery periods as needed and not to push during the time right after chemo. Also educated pt how balance is effected by chemo.   11/03/2024 NuStep level 4, seat at 10, arms at 9 x 10 min - 952 steps  Dual cable machine: 3 lbs for scap retraction x 10 reps with pt returning therapist demo Leg press seat at 7, 70 lbs 1 x 580 lbs- 1 sets of 10 reps, 85 lbs 1 x 10 reps Bilateral HS stretch seated 3 x 20 sec 3 way hip machine: 25 lbs in flexion, abduction and extension x 10 each bilaterally with pt returning therapist demo Ball squeeze 5 sec hold x 10 Tandem stance in// bars; 2 B, 30 sec with intermittent HH  And VC's for posture and core stab  11/01/24:  Therapeutic Exercise: Seated hamstring stretch x 30 sec holds x 2 reps bilaterally with hands on hips to keep from rounded back NuStep level 4, seat at 10, arms at 9 x 10 min - steps 835 3 way hip machine: 25 lbs in flexion, abduction and extension x 10 each bilaterally with pt returning therapist demo Leg press seat at 7, 70 lbs- 2 sets of 10 reps Dual cable machine: 3 lbs for scap retraction x 10 reps with pt returning therapist demo Therapeutic Activity: Bridging x 10 reps with 5 sec holds with v/c to engage core throughout and not just push up with  glutes Pelvic tilts x 10 reps with 5 sec holds with pt demonstrating good form Seated on red disc for core engagement: TKE with 2lb ankle weights with 3 sec holds x 10 reps bilaterally  Seated hip flexion with 2 lb ankle weights x 10 with v/c to not lean back Ball squeezes with 5 sec holds x 10   10/21/24:  Therapeutic Exercise: Seated hamstring stretch x 30 sec holds x 2 reps bilaterally with hands on hips to keep from rounded back NuStep level 4, seat at 10, arms at 9 (no arms half way through due to hx of SLNB) x 10 min - steps 940 Therapeutic Activity: Bridging x 12 reps with 5 sec holds with pt demonstrating good form today and no hamstring cramping Pelvic tilts x 10 reps with 5 sec holds with pt demonstrating good form Scapular retraction with v/c to pull back and down x 10 reps with yellow band and cues for upright posture and keeping elbows close Seated on red disc for core engagement: TKE with 2lb ankle weights with 3 sec holds x 10 reps bilaterally  Seated hip flexion with 2 lb ankle weights x 10 with v/c to not lean back Ball squeezes with 5 sec holds x 13  Hip abduction x 10 reps with green theraband Neuro reed: Standing on air  ex with occasional HHA for balance: hip flexion x 10 reps bilaterally then hip extension x 10 bilaterally with more difficulty with balance, hip abduction x 10 bilaterally  Tandem walking x 4 on air ex beam with occasional hand held assist  10/19/24:  Therapeutic Exercise: Seated hamstring stretch x 30 sec holds x 2 reps bilaterally with v/c to keep from rounding back TKE with 2lb ankle weights with 3 sec holds x 10 reps bilaterally  Seated hip flexion with 2 lb ankle weights x 10 with v/c to not lean back NuStep level 4, seat at 10, arms at 9 (no arms half way through due to hx of SLNB) x 10 min - steps 870 Therapeutic Activity: Bridging x 10 reps with 5 sec holds with pt demonstrating good form today and no hamstring cramping Pelvic tilts x 10  reps with 5 sec holds with pt demonstrating good form Scapular retraction with v/c to pull back and down x 10 reps with yellow band and cues for upright posture and keeping elbows close Neuro reed: Standing on air ex with 1 HHA: hip flexion x 10 reps bilaterally then hip extension with v/c to keep knee straight with pt having difficulty clearing mat due to inability to hip hike  10/12/24:  Therapeutic Exercise: Seated hamstring stretch x 30 sec holds x 2 reps bilaterally with v/c to keep from rounding back TKE with 2lb ankle weights with 3 sec holds x 10 reps bilaterally  Seated hip flexion with 2 lb ankle weights x 10 with v/c to not lean back NuStep level 3, seat at 10, arms at 9 (using arms 2 min on/2 min off due to hx of SLNB) x 10 min Therapeutic Activity: Bridging x 10 reps with v/c and t/c to use the core and engage core throughout to avoid hamstring cramping Pelvic tilts x 10 reps with 5 sec holds with v/c and t/c for proper form Scapular retraction with v/c to pull back and down x 10 reps with red band   PATIENT EDUCATION:  Education details: how chemo effects the body including fatigue and balance and importance of not pushing through in the time right after chemo Person educated: Patient Education method: Explanation Education comprehension: verbalized understanding  HOME EXERCISE PROGRAM: Reviewed previously given post op HEP. Practice sit to stands from chair without use of UEs  Access Code: [email or web address] URL: [email or web address] Date: 10/12/2024 Prepared by: Florina Lanis Carbon  Exercises - Seated Hamstring Stretch  - 1 x daily - 7 x weekly - 1 sets - 2 reps - 30 sec hold - Seated Hip Flexion March with Ankle Weights  - 1 x daily - 7 x weekly - 1 sets - 10 reps - Seated Long Arc Quad with Ankle Weight  - 1 x daily - 7 x weekly - 1 sets - 10 reps - 3 sec hold - Supine Bridge  - 1 x daily - 7 x weekly - 1 sets - 10 reps - Supine Posterior Pelvic Tilt  - 1  x daily - 7 x weekly - 1-3 sets - 10 reps - 5 sec hold - Scapular Retraction with Resistance  - 1 x daily - 7 x weekly - 1 sets - 10 reps   ASSESSMENT:  CLINICAL IMPRESSION: Pt initially feeling very fatigued from chemo yesterday. Assessed pt throughout session but was able to get through entire session today. Continued to focus on neuro re ed to help improve balance and decrease fall risk as well as posture  exercises. Pt has been compliant with her HEP except for the dya she had chemo. Added ankle DF strengthening.   Pt will benefit from skilled therapeutic intervention to improve on the following deficits: Decreased knowledge of precautions, impaired UE functional use, pain, decreased ROM, postural dysfunction.   PT treatment/interventions: ADL/Self care home management, [phone]- PT Re-evaluation, 97110-Therapeutic exercises, 97530- Therapeutic activity, [id number]- Neuromuscular re-education, 97535- Self Care, [id number]- Manual therapy, [phone]- Gait training, and Patient/Family education   GOALS: Goals reviewed with patient? Yes  GOALS MET AT EVAL:  GOALS Name Target Date Goal status  1 Pt will be able to verbalize understanding of pertinent lymphedema risk reduction practices relevant to her dx specifically related to skin care.  Baseline:  No knowledge Eval Achieved at eval  2 Pt will be able to return demo and/or verbalize understanding of the post op HEP related to regaining shoulder ROM. Baseline:  No knowledge Eval Achieved at eval  3 Pt will be able to verbalize understanding of the importance of viewing the post op After Breast CA Class video for further lymphedema risk reduction education and therapeutic exercise.  Baseline:  No knowledge Eval Achieved at eval   LONG TERM GOALS:  (STG=LTG)  GOALS Name Target Date  Goal status  1 Pt will demonstrate she has regained full shoulder ROM and function post operatively compared to baselines.  Baseline: 08/25/2024 MET 11/10/24  2 Pt will  demonstrate at least 4/5 bilateral hip flexion strength to decrease fall risk. 11/08/24 MET 11/10/24  3 Pt will be able to complete SLS bilaterally for at least 15 sec to decrease fall risk. 11/08/24 ONGOING 11/10/24  4 Pt will be able to stand in tandem stance for 15 sec with no losses of balance to decrease fall risk. 11/08/24 ONGOING 11/10/24  5 Pt will be independent in a home exercise program for continued stretching and strengthening.  11/08/24 ONGOING 11/10/24     PLAN:  PT FREQUENCY/DURATION: 2x/wk for 4 weeks  PLAN FOR NEXT SESSION: DF strength with TB especially for left,how are effects from chemo?  NuStep, hip strengthening, balance exercises, air ex beam, hamstring stretching, posture exercises, give post op handout   [facility]  [address]  [place] [address]  [phone]  After Breast Cancer Class Video It is recommended you view the ABC class video to be educated on lymphedema risk reduction. This video lasts for about 30 minutes. It can be viewed on our website here: [email or web address]  Scar massage You can begin gentle scar massage to you incision sites. Gently place one hand on the incision and move the skin (without sliding on the skin) in various directions. Do this for a few minutes and then you can gently massage either coconut oil or vitamin E cream into the scars.  Compression garment You should continue wearing your compression bra until you feel like you no longer have swelling.  Home exercise Program Continue doing the exercises you were given until you feel like you can do them without feeling any tightness at the end.   Walking Program Studies show that 30 minutes of walking per day (fast enough to elevate your heart rate) can significantly reduce the risk of a cancer recurrence. If you can't walk due to other medical reasons, we encourage you to find another  activity you could do (like a stationary bike or water exercise).  Posture After breast cancer surgery, people frequently sit with rounded shoulders posture because it puts their  incisions on slack and feels better. If you sit like this and scar tissue forms in that position, you can become very tight and have pain sitting or standing with good posture. Try to be aware of your posture and sit and stand up tall to heal properly.  Follow up PT: It is recommended you return every 3 months for the first 3 years following surgery to be assessed on the SOZO machine for an L-Dex score. This helps prevent clinically significant lymphedema in 95% of patients. These follow up screens are 10 minute appointments that you are not billed for.  Grayce JINNY Sheldon, PT 12/03/2024, 12:42 PM  "

## 2024-12-03 NOTE — Addendum Note (Signed)
 Addended by: EUSTACIO POUR on: 12/03/2024 06:13 PM   Modules accepted: Orders

## 2024-12-06 ENCOUNTER — Other Ambulatory Visit: Payer: Self-pay | Admitting: Endocrinology

## 2024-12-06 DIAGNOSIS — E109 Type 1 diabetes mellitus without complications: Secondary | ICD-10-CM

## 2024-12-07 ENCOUNTER — Ambulatory Visit: Admitting: Physical Therapy

## 2024-12-07 ENCOUNTER — Encounter: Payer: Self-pay | Admitting: Physical Therapy

## 2024-12-07 DIAGNOSIS — M6281 Muscle weakness (generalized): Secondary | ICD-10-CM

## 2024-12-07 DIAGNOSIS — C50412 Malignant neoplasm of upper-outer quadrant of left female breast: Secondary | ICD-10-CM

## 2024-12-07 DIAGNOSIS — R293 Abnormal posture: Secondary | ICD-10-CM

## 2024-12-07 DIAGNOSIS — Z483 Aftercare following surgery for neoplasm: Secondary | ICD-10-CM

## 2024-12-09 ENCOUNTER — Ambulatory Visit

## 2024-12-09 DIAGNOSIS — C50412 Malignant neoplasm of upper-outer quadrant of left female breast: Secondary | ICD-10-CM

## 2024-12-09 DIAGNOSIS — Z483 Aftercare following surgery for neoplasm: Secondary | ICD-10-CM

## 2024-12-09 DIAGNOSIS — M6281 Muscle weakness (generalized): Secondary | ICD-10-CM

## 2024-12-09 DIAGNOSIS — R293 Abnormal posture: Secondary | ICD-10-CM

## 2024-12-09 NOTE — Therapy (Signed)
 " OUTPATIENT PHYSICAL THERAPY BREAST CANCER POST OP FOLLOW UP   Patient Name: [name] MRN: [id number] DOB:1946-08-12, 79 y.o., female Today's Date: 12/09/2024  END OF SESSION:  PT End of Session - 12/09/24 1209     Visit Number 15    Number of Visits 22    Date for Recertification  01/04/25    PT Start Time 1210    PT Stop Time 1257    PT Time Calculation (min) 47 min    Activity Tolerance Patient tolerated treatment well    Behavior During Therapy [facility] for tasks assessed/performed               Past Medical History:  Diagnosis Date   Arthritis    OA   Atrial flutter (HCC)    a. s/p TEE/DCCV 08/16/13 (normal LV function, no LAA thrombus).b. s/p ablation by Dr Waddell 09-23-2013   Coronary artery disease    2009 LAD Promus stent   Dysrhythmia    Aflutter, no issues after ablation 2014   Fibroid    Hyperlipidemia    Hypertension    Insulin  pump in place    Osteopenia    Type 1 diabetes mellitus on insulin  therapy (HCC)    Valvular heart disease    a. Mild MR/TR by TEE 08/2013.   Past Surgical History:  Procedure Laterality Date   ABLATION  09/23/2013   CTI by Dr Waddell   APPENDECTOMY     APPLICATION OF WOUND VAC Left 04/11/2017   Procedure: APPLICATION OF WOUND VAC LEFT KNEE;  Surgeon: Fidel Rogue, MD;  Location: [facility] OR;  Service: Orthopedics;  Laterality: Left;   ATRIAL FLUTTER ABLATION N/A 09/23/2013   Procedure: ATRIAL FLUTTER ABLATION;  Surgeon: Danelle LELON Waddell, MD;  Location: [facility] CATH LAB;  Service: Cardiovascular;  Laterality: N/A;   CARDIOVERSION N/A 08/16/2013   Procedure: CARDIOVERSION;  Surgeon: Aleene JINNY Passe, MD;  Location: [facility] ENDOSCOPY;  Service: Cardiovascular;  Laterality: N/A;  spoke with Tom   CATARACT EXTRACTION Bilateral    COLONOSCOPY     CORONARY ANGIOPLASTY WITH STENT PLACEMENT     EYE SURGERY     Cataract   LASER ABLATION Right 11/20/2023   Right leg laser ablation of the Greater saphenous vein with >20 stab phlebectomies    MYOMECTOMY  1977   ORIF PATELLA Left 04/11/2017   ORIF PATELLA Left 04/11/2017   Procedure: OPEN REDUCTION INTERNAL (ORIF) FIXATION PATELLA LEFT KNEE;  Surgeon: Fidel Rogue, MD;  Location: [facility] OR;  Service: Orthopedics;  Laterality: Left;   PELVIC LAPAROSCOPY     PORTACATH PLACEMENT Right 07/27/2024   Procedure: INSERTION OF TUNNELED CENTRAL VENOUS DEVICE WITH PORT;  Surgeon: Vernetta Berg, MD;  Location: [facility];  Service: General;  Laterality: Right;  PORT-A-CATH INSERTION WITH ULTRASOUND GUIDANCE   SENTINEL NODE BIOPSY Left 07/27/2024   Procedure: LEFT SENTINEL LYMPH NODE BIOPSY;  Surgeon: Vernetta Berg, MD;  Location: [facility];  Service: General;  Laterality: Left;  SENTINEL NODE BIOPSY   TEE WITHOUT CARDIOVERSION N/A 08/16/2013   Procedure: TRANSESOPHAGEAL ECHOCARDIOGRAM (TEE);  Surgeon: Aleene JINNY Passe, MD;  Location: [facility] ENDOSCOPY;  Service: Cardiovascular;  Laterality: N/A;   TUBAL LIGATION  1988   Patient Active Problem List   Diagnosis Date Noted   Atrial flutter (HCC) 07/05/2024   Malignant neoplasm of upper-outer quadrant of left breast in female, estrogen receptor positive (HCC) 06/28/2024   Acute lateral meniscal tear, right, initial encounter 09/02/2023   Varicose veins  of both lower extremities with pain 07/04/2023   Arthritis of carpometacarpal [facility]) joint of both thumbs 01/17/2022   Bilateral hearing loss 01/08/2022   Hypertension 05/13/2019   Urinary incontinence 01/19/2019   Valvular heart disease    Insulin  pump in place    Fibroid    Osteoarthritis    Mixed diabetic hyperlipidemia associated with type 1 diabetes mellitus (HCC) 03/16/2015   Type 1 diabetes mellitus with complication (HCC) 05/12/2013   Chronic insomnia 03/25/2012   Osteopenia    Atherosclerosis of native coronary artery of native heart with angina pectoris 04/05/2011    REFERRING PROVIDER: Dr. Vicenta Poli  REFERRING DIAG: Left breast  cancer  THERAPY DIAG:  Muscle weakness (generalized)  Aftercare following surgery for neoplasm  Abnormal posture  Malignant neoplasm of upper-outer quadrant of left breast in female, estrogen receptor positive (HCC)  Rationale for Evaluation and Treatment: Rehabilitation  ONSET DATE: 07/27/2024  SUBJECTIVE:                                                                                                                                                                                           SUBJECTIVE STATEMENT:  I find if I do too much in one day I pay for it the next. I will be so tired. I am feeling better today.   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 She underwent a left lumpectomy and sentinel node biopsy (4 negative nodes) on 07/27/2024. It is triple positive with a Ki67 of 3%. She is currently undergoing chemotherapy. Pt is unsure if she will need radiation.    PATIENT GOALS:  Reassess how my recovery is going related to arm function, pain, and swelling.  PAIN:  Are you having pain? No  PRECAUTIONS: Recent Surgery, left UE Lymphedema risk  RED FLAGS: None   ACTIVITY LEVEL / LEISURE: walked the dogs a couple of block on Wednesday, has not been going to the fitness center lately due to fatigue   OBJECTIVE:   PATIENT SURVEYS:  QUICK DASH:     OBSERVATIONS: Healing lumpectomy scar but increased scar tissue palpable  POSTURE:  Increased kyphosis, forward head  LYMPHEDEMA ASSESSMENT:   UPPER EXTREMITY AROM/PROM:   A/PROM RIGHT   eval    Shoulder extension 50  Shoulder flexion 147  Shoulder abduction 151  Shoulder internal rotation 72  Shoulder external rotation 43                          (Blank rows = not tested)   A/PROM LEFT  eval LEFT 10/11/2024 LEFT 11/10/24  Shoulder extension 51 74 60  Shoulder flexion 135 162 160  Shoulder abduction 157 160 165  Shoulder internal  rotation 57 65 65  Shoulder external rotation 79 78 72                          (Blank rows = not tested)   CERVICAL AROM: All within normal limits:      Percent limited  Flexion WNL  Extension WFL  Right lateral flexion 50% limited  Left lateral flexion 75% limited  Right rotation 50% limited  Left rotation           50% limited      UPPER EXTREMITY STRENGTH: WFL   LYMPHEDEMA ASSESSMENTS (in cm):    LANDMARK RIGHT   eval RIGHT 10/11/2024  10 cm proximal to olecranon process 27.7 26.1  Olecranon process 26.5 25.5  10 cm proximal to ulnar styloid process 22 20  Just proximal to ulnar styloid process 17.4 16.7  Across hand at thumb web space 18.8 19.5  At base of 2nd digit 6.9 6.7  (Blank rows = not tested)   LANDMARK LEFT   eval LEFT 10/11/2024  10 cm proximal to olecranon process 27.2 25.7  Olecranon process 26.1 25  10  cm proximal to ulnar styloid process 21.9 20.7  Just proximal to ulnar styloid process 17.7 16.9  Across hand at thumb web space 19.4 19.6  At base of 2nd digit 6.8 6.6  (Blank rows = not tested)  LOWER EXTREMITY MMT:  MMT Right eval RIGHT 11/10/24  Hip flexion 3+ 5/5  Hip extension 3   Hip abduction 4+   Hip adduction    Hip internal rotation    Hip external rotation    Knee flexion 5   Knee extension 5   Ankle dorsiflexion 5   Ankle plantarflexion    Ankle inversion    Ankle eversion     (Blank rows = not tested)  MMT LEFT eval LEFT 11/10/24  Hip flexion 3+ 5/5  Hip extension 3   Hip abduction 4+   Hip adduction    Hip internal rotation    Hip external rotation    Knee flexion 4   Knee extension 5   Ankle dorsiflexion 4+   Ankle plantarflexion    Ankle inversion    Ankle eversion     (Blank rows = not tested)  Surgery type/Date: 07/27/2024 left lumpectomy and sentinel node biopsy Number of lymph nodes removed: 4 Current/past treatment (chemo, radiation, hormone therapy): chemotherapy Other symptoms:  Heaviness/tightness  No Pain No Pitting edema No Infections No Decreased scar mobility Yes Stemmer sign No  30 SEC SIT TO STAND: 10 reps which is below average for her age  SINGLE LIMB STANCE:  12/07/24- R - 5 seconds, L 13 seconds 11/10/24: R - 2 sec, L 6 sec At eval: R - 5 seconds, L 8 seconds  GAIT: Shortened step length bilaterally, foot flat, unsteady at time but no losses of balance  TANDEM STANCE:  12/07/24- R - 6 seconds, L 7 seconds 11/11/23: 9 sec At eval:Tandem with R foot in from 3 sec, with L foot in front 8 seconds  TREATMENT PERFORMED:  12/09/2024 Nu step seat 10, UE 9 L4 x 10 min, 952 steps Rocker board DF x 10 Free motion scapular retraction Obstacle course;ax pad, 2 hurdles, ax pad, 2 hurdles, incline x 4 laps forward, 1 lap sideways with  intermittent hand touch prn for balance( light UE contact by PT for safety) Vibration plate; HS stretch B 2 x 30 sec Vibration plate heel raises and mini squats on stretch setting x 30 sec each 12/07/2024 Assessed progress towards goals in therapy Therapeutic Exercise: Nu step seat 10, UE 9 L4 x 10 min, 812 steps Seated ankle DF with green band x 30 reps bilaterally  Rocker board x 30 reps to end range with good stretch felt Neuro Reeducation: Airex beam: 8  lengths forward walking with intermittent hand touch prn for balance, and 8 Lengths sidestepping with intermittent hand hold, 8 lengths backwards stepping Stepping over hurdles forward 6 lengths, sideways 6 lengths; intermittent hand touch, and backwards 6 lengths with increased difficulty Standing with opposite foot on blue oval foam - 3 way hip in to flexion, abduction and extension x 10 reps each with 1 HHA   12/03/2024  Nu step seat 10, UE 9 L4 x , Lev3  x 5 min   842 steps Incline stretch x 3, 20 sec Pt then reported not feeling well so had pt sit in a chair to rest and gave her water. Her insulin  pump went off due to high sugar. She adjusted her insulin , but continued not to feel well. She  reported feeling fine while sitting, and only unwell with standing. Discussed with pt ending therapy which she thought was a good idea. Offered to call her husband since she drove here, but she said as long as she is sitting she feels fine. Assisted pt to the car, and again discussed driving and she reported feeling fine now that she is sitting. Advised to go straight home. Called pt at the end of the morning and she is home and still not feeling the greatest , but is OK with sitting and lying. Her infusion was 8 days ago and she believes it is from that. Will return to therapy next week if feeling better. 11/24/2024 Nu step seat 10, UE 9 L4 x 10 min, 842 steps Seated ankle DF with red band x 20 reps bilaterally - issued this to pt to do at home as well Sit to stand from high table with red plyoball overhead press and slow lower, 2 x 5 Incline stretch x 3, 20 sec Ax beam: 6  lengths forward walking with intermittent hand touch prn for balance, and 6 Lengths sidestepping with intermittent hand hold Tandem stance to failure x 2  Stepping over hurdles forward 6 lengths, sideways 6 lengths; intermittent hand touch Dual cable retraction 3 # 2 x 10 with emphasis on proper posture  11/22/2024 Nu step seat 10, UE 9 L4 x 10 min, 815 steps Sit to stand from high table with red plyoball overhead press and slow lower, 2 x 5 Incline stretch x 3, 20 sec Ax beam: 6  lengths forward walking with intermittent hand touch prn for balance 6 Lengths sidestepping with intermittent hand hold Tandem stance x 2 B to failure Stepping over hurdles forward 6 lengths, sideways 6 lengths; intermittent hand touch Dual cable retraction 3 # 2 x 10 with emphasis on proper posture ADD DF with band next 11/17/2024 Sit to stand from chair x 2 but difficult without UeEs Sit to stand from high table with red plyoball overhead press and slow lower, 3 x 5 Nu step seat 10, UE 9 L4 x 10 min, 965 steps Incline stretch x 3, 20 sec Dual  cable retraction 3 # 1 x 10 with feet in  staggered stance, x 10 with near tandem stance, x 5 near tandem Ambulated to ortho gym and went up and down 4 steps with bilateral rails with slow lowering x 3 Leg Press 85# B 2 x 10 reps 4 hurdles in //bars; forward walking 6 lengths in foot in between, sidestepping both feet between 4 lengths with light HH HS stretch seated x 3 ea, 20 sec 11/15/2024 NuStep level 4, seat at 10, arms at 9 x 10 min, 905  Dual cable machine: 3 lbs for scap retraction x 10 reps with pt returning therapist demo, then x 5 ea in half tandem stance Incline stretch x 3, 20 secs Standing march in // bars x 10 ea Standing heel raises x 10 ea Ax beam x 6 lengths forward, x 4 lengths sidestepping with intermittent hand hold, and encouragement to focus on object, seated rest SLS on blue foam 3 D SLR;flex, abd ext x 10 ea with intermittent HH Tandem stance B x 3 ea to failure, more difficult with Left leg forward. Bilateral HS stretch x 2 ea., 20 seconds  11/10/24 Assessed progress towards all goals in therapy - assessed strength and balance NuStep level 4, seat at 10, arms at 9 x 10 min - 773 steps  Tandem walking on air ex foam x 4 with bilateral HHA with increased fatigue requiring seated recovery period afterwards  Attempted 3 way hip on purple foam but pt became very fatigued and short of breath after 10 reps of L hip flexion and required another seated recovery periods due to fatigue and shortness of breath Self care- discussed how chemo effects the body and how it is important to listen to the body such as dizziness and shortness of breath and take recovery periods as needed and not to push during the time right after chemo. Also educated pt how balance is effected by chemo.   11/03/2024 NuStep level 4, seat at 10, arms at 9 x 10 min - 952 steps  Dual cable machine: 3 lbs for scap retraction x 10 reps with pt returning therapist demo Leg press seat at 7, 70 lbs 1 x 580 lbs- 1  sets of 10 reps, 85 lbs 1 x 10 reps Bilateral HS stretch seated 3 x 20 sec 3 way hip machine: 25 lbs in flexion, abduction and extension x 10 each bilaterally with pt returning therapist demo Ball squeeze 5 sec hold x 10 Tandem stance in// bars; 2 B, 30 sec with intermittent HH  And VC's for posture and core stab  11/01/24:  Therapeutic Exercise: Seated hamstring stretch x 30 sec holds x 2 reps bilaterally with hands on hips to keep from rounded back NuStep level 4, seat at 10, arms at 9 x 10 min - steps 835 3 way hip machine: 25 lbs in flexion, abduction and extension x 10 each bilaterally with pt returning therapist demo Leg press seat at 7, 70 lbs- 2 sets of 10 reps Dual cable machine: 3 lbs for scap retraction x 10 reps with pt returning therapist demo Therapeutic Activity: Bridging x 10 reps with 5 sec holds with v/c to engage core throughout and not just push up with glutes Pelvic tilts x 10 reps with 5 sec holds with pt demonstrating good form Seated on red disc for core engagement: TKE with 2lb ankle weights with 3 sec holds x 10 reps bilaterally  Seated hip flexion with 2 lb ankle weights x 10 with v/c to not lean back [place] squeezes with 5  sec holds x 10   10/21/24:  Therapeutic Exercise: Seated hamstring stretch x 30 sec holds x 2 reps bilaterally with hands on hips to keep from rounded back NuStep level 4, seat at 10, arms at 9 (no arms half way through due to hx of SLNB) x 10 min - steps 940 Therapeutic Activity: Bridging x 12 reps with 5 sec holds with pt demonstrating good form today and no hamstring cramping Pelvic tilts x 10 reps with 5 sec holds with pt demonstrating good form Scapular retraction with v/c to pull back and down x 10 reps with yellow band and cues for upright posture and keeping elbows close Seated on red disc for core engagement: TKE with 2lb ankle weights with 3 sec holds x 10 reps bilaterally  Seated hip flexion with 2 lb ankle weights x 10 with v/c  to not lean back Ball squeezes with 5 sec holds x 13  Hip abduction x 10 reps with green theraband Neuro reed: Standing on air ex with occasional HHA for balance: hip flexion x 10 reps bilaterally then hip extension x 10 bilaterally with more difficulty with balance, hip abduction x 10 bilaterally  Tandem walking x 4 on air ex beam with occasional hand held assist  10/19/24:  Therapeutic Exercise: Seated hamstring stretch x 30 sec holds x 2 reps bilaterally with v/c to keep from rounding back TKE with 2lb ankle weights with 3 sec holds x 10 reps bilaterally  Seated hip flexion with 2 lb ankle weights x 10 with v/c to not lean back NuStep level 4, seat at 10, arms at 9 (no arms half way through due to hx of SLNB) x 10 min - steps 870 Therapeutic Activity: Bridging x 10 reps with 5 sec holds with pt demonstrating good form today and no hamstring cramping Pelvic tilts x 10 reps with 5 sec holds with pt demonstrating good form Scapular retraction with v/c to pull back and down x 10 reps with yellow band and cues for upright posture and keeping elbows close Neuro reed: Standing on air ex with 1 HHA: hip flexion x 10 reps bilaterally then hip extension with v/c to keep knee straight with pt having difficulty clearing mat due to inability to hip hike  10/12/24:  Therapeutic Exercise: Seated hamstring stretch x 30 sec holds x 2 reps bilaterally with v/c to keep from rounding back TKE with 2lb ankle weights with 3 sec holds x 10 reps bilaterally  Seated hip flexion with 2 lb ankle weights x 10 with v/c to not lean back NuStep level 3, seat at 10, arms at 9 (using arms 2 min on/2 min off due to hx of SLNB) x 10 min Therapeutic Activity: Bridging x 10 reps with v/c and t/c to use the core and engage core throughout to avoid hamstring cramping Pelvic tilts x 10 reps with 5 sec holds with v/c and t/c for proper form Scapular retraction with v/c to pull back and down x 10 reps with red  band   PATIENT EDUCATION:  Education details: how chemo effects the body including fatigue and balance and importance of not pushing through in the time right after chemo Person educated: Patient Education method: Explanation Education comprehension: verbalized understanding  HOME EXERCISE PROGRAM: Reviewed previously given post op HEP. Practice sit to stands from chair without use of UEs  Access Code: [email or web address] URL: [email or web address] Date: 10/12/2024 Prepared by: Florina Lanis Carbon  Exercises - Seated Hamstring Stretch  - 1 x daily -  7 x weekly - 1 sets - 2 reps - 30 sec hold - Seated Hip Flexion March with Ankle Weights  - 1 x daily - 7 x weekly - 1 sets - 10 reps - Seated Long Arc Quad with Ankle Weight  - 1 x daily - 7 x weekly - 1 sets - 10 reps - 3 sec hold - Supine Bridge  - 1 x daily - 7 x weekly - 1 sets - 10 reps - Supine Posterior Pelvic Tilt  - 1 x daily - 7 x weekly - 1-3 sets - 10 reps - 5 sec hold - Scapular Retraction with Resistance  - 1 x daily - 7 x weekly - 1 sets - 10 reps   ASSESSMENT:  CLINICAL IMPRESSION: Pt was feeling better today and required only 1 water break and 1 short standing break. She was fatigued after the obstacle course but did very well with Supervision and light arm contact by PT, without using her hands except very intermittently for balance. She has 1 more chemo next week. Excellent increase in number of steps on the Nu Step.  Pt will benefit from skilled therapeutic intervention to improve on the following deficits: Decreased knowledge of precautions, impaired UE functional use, pain, decreased ROM, postural dysfunction.   PT treatment/interventions: ADL/Self care home management, [phone]- PT Re-evaluation, 97110-Therapeutic exercises, 97530- Therapeutic activity, [id number]- Neuromuscular re-education, 97535- Self Care, [id number]- Manual therapy, [phone]- Gait training, and Patient/Family education   GOALS: Goals reviewed with  patient? Yes  GOALS MET AT EVAL:  GOALS Name Target Date Goal status  1 Pt will be able to verbalize understanding of pertinent lymphedema risk reduction practices relevant to her dx specifically related to skin care.  Baseline:  No knowledge Eval Achieved at eval  2 Pt will be able to return demo and/or verbalize understanding of the post op HEP related to regaining shoulder ROM. Baseline:  No knowledge Eval Achieved at eval  3 Pt will be able to verbalize understanding of the importance of viewing the post op After Breast CA Class video for further lymphedema risk reduction education and therapeutic exercise.  Baseline:  No knowledge Eval Achieved at eval   LONG TERM GOALS:  (STG=LTG)  GOALS Name Target Date  Goal status  1 Pt will demonstrate she has regained full shoulder ROM and function post operatively compared to baselines.  Baseline: 08/25/2024 MET 11/10/24  2 Pt will demonstrate at least 4/5 bilateral hip flexion strength to decrease fall risk. 11/08/24 MET 11/10/24  3 Pt will be able to complete SLS bilaterally for at least 15 sec to decrease fall risk. 11/08/24 ONGOING 12/07/24 R - 5 sec, L 13 sec  4 Pt will be able to stand in tandem stance for 15 sec with no losses of balance to decrease fall risk. 11/08/24 ONGOING 12/07/24- R 6 sec, L 7 sec  5 Pt will be independent in a home exercise program for continued stretching and strengthening.  11/08/24 ONGOING 12/07/24     PLAN:  PT FREQUENCY/DURATION: 2x/wk for 4 weeks  PLAN FOR NEXT SESSION: DF strength with TB especially for left,how are effects from chemo?  NuStep, hip strengthening, balance exercises, air ex beam, hamstring stretching, posture exercises, give post op handout   [facility]  [address], [address]  [place] [address]  [phone]  After Breast Cancer Class Video It is recommended you view the ABC class video to be educated on lymphedema risk reduction. This video lasts  for about 30 minutes. It  can be viewed on our website here: [email or web address]  Scar massage You can begin gentle scar massage to you incision sites. Gently place one hand on the incision and move the skin (without sliding on the skin) in various directions. Do this for a few minutes and then you can gently massage either coconut oil or vitamin E cream into the scars.  Compression garment You should continue wearing your compression bra until you feel like you no longer have swelling.  Home exercise Program Continue doing the exercises you were given until you feel like you can do them without feeling any tightness at the end.   Walking Program Studies show that 30 minutes of walking per day (fast enough to elevate your heart rate) can significantly reduce the risk of a cancer recurrence. If you can't walk due to other medical reasons, we encourage you to find another activity you could do (like a stationary bike or water exercise).  Posture After breast cancer surgery, people frequently sit with rounded shoulders posture because it puts their incisions on slack and feels better. If you sit like this and scar tissue forms in that position, you can become very tight and have pain sitting or standing with good posture. Try to be aware of your posture and sit and stand up tall to heal properly.  Follow up PT: It is recommended you return every 3 months for the first 3 years following surgery to be assessed on the SOZO machine for an L-Dex score. This helps prevent clinically significant lymphedema in 95% of patients. These follow up screens are 10 minute appointments that you are not billed for.  Grayce JINNY Sheldon, PT 12/09/2024, 1:05 PM  "

## 2024-12-13 ENCOUNTER — Ambulatory Visit

## 2024-12-14 ENCOUNTER — Inpatient Hospital Stay: Attending: Hematology | Admitting: Hematology

## 2024-12-14 ENCOUNTER — Inpatient Hospital Stay

## 2024-12-15 ENCOUNTER — Ambulatory Visit: Admitting: Physical Therapy

## 2024-12-16 ENCOUNTER — Encounter: Admitting: Student

## 2024-12-16 ENCOUNTER — Inpatient Hospital Stay

## 2024-12-21 ENCOUNTER — Ambulatory Visit

## 2024-12-23 ENCOUNTER — Ambulatory Visit: Admitting: Physical Therapy

## 2024-12-27 ENCOUNTER — Ambulatory Visit

## 2024-12-29 ENCOUNTER — Ambulatory Visit: Admitting: Physical Therapy

## 2025-01-03 ENCOUNTER — Ambulatory Visit

## 2025-01-04 ENCOUNTER — Inpatient Hospital Stay

## 2025-01-04 ENCOUNTER — Inpatient Hospital Stay: Admitting: Hematology

## 2025-01-05 ENCOUNTER — Ambulatory Visit: Admitting: Physical Therapy

## 2025-01-10 ENCOUNTER — Ambulatory Visit (HOSPITAL_BASED_OUTPATIENT_CLINIC_OR_DEPARTMENT_OTHER): Admit: 2025-01-10 | Admitting: Surgery

## 2025-01-10 ENCOUNTER — Encounter (HOSPITAL_BASED_OUTPATIENT_CLINIC_OR_DEPARTMENT_OTHER): Payer: Self-pay

## 2025-01-19 ENCOUNTER — Encounter

## 2025-01-21 ENCOUNTER — Ambulatory Visit (HOSPITAL_COMMUNITY)

## 2025-01-25 ENCOUNTER — Inpatient Hospital Stay

## 2025-01-25 ENCOUNTER — Inpatient Hospital Stay: Admitting: Hematology

## 2025-01-25 ENCOUNTER — Inpatient Hospital Stay: Attending: Hematology

## 2025-01-28 ENCOUNTER — Encounter: Admitting: Student

## 2025-01-28 ENCOUNTER — Encounter

## 2025-02-04 ENCOUNTER — Encounter: Admitting: Student

## 2025-02-04 ENCOUNTER — Encounter

## 2025-02-10 ENCOUNTER — Ambulatory Visit: Admitting: Endocrinology

## 2025-02-28 ENCOUNTER — Ambulatory Visit

## 2025-02-28 ENCOUNTER — Encounter: Admitting: Internal Medicine
